# Patient Record
Sex: Female | Born: 1955 | Race: White | Hispanic: No | Smoking: Current every day smoker
Health system: Southern US, Community
[De-identification: ages and names within clinical notes are randomized; demographics above are authoritative.]

## PROBLEM LIST (undated history)

## (undated) DIAGNOSIS — K602 Anal fissure, unspecified: Secondary | ICD-10-CM

## (undated) DIAGNOSIS — G8929 Other chronic pain: Secondary | ICD-10-CM

## (undated) DIAGNOSIS — E7800 Pure hypercholesterolemia, unspecified: Secondary | ICD-10-CM

## (undated) DIAGNOSIS — M48061 Spinal stenosis, lumbar region without neurogenic claudication: Secondary | ICD-10-CM

## (undated) DIAGNOSIS — I700 Atherosclerosis of aorta: Secondary | ICD-10-CM

## (undated) DIAGNOSIS — F329 Major depressive disorder, single episode, unspecified: Secondary | ICD-10-CM

## (undated) DIAGNOSIS — M5136 Other intervertebral disc degeneration, lumbar region: Secondary | ICD-10-CM

## (undated) DIAGNOSIS — Z972 Presence of dental prosthetic device (complete) (partial): Secondary | ICD-10-CM

## (undated) DIAGNOSIS — R519 Headache, unspecified: Secondary | ICD-10-CM

## (undated) DIAGNOSIS — Z7982 Long term (current) use of aspirin: Secondary | ICD-10-CM

## (undated) DIAGNOSIS — T7840XA Allergy, unspecified, initial encounter: Secondary | ICD-10-CM

## (undated) DIAGNOSIS — N8110 Cystocele, unspecified: Secondary | ICD-10-CM

## (undated) DIAGNOSIS — Z860100 Personal history of colon polyps, unspecified: Secondary | ICD-10-CM

## (undated) DIAGNOSIS — R0609 Other forms of dyspnea: Secondary | ICD-10-CM

## (undated) DIAGNOSIS — I10 Essential (primary) hypertension: Secondary | ICD-10-CM

## (undated) DIAGNOSIS — M549 Dorsalgia, unspecified: Principal | ICD-10-CM

## (undated) DIAGNOSIS — K219 Gastro-esophageal reflux disease without esophagitis: Secondary | ICD-10-CM

## (undated) DIAGNOSIS — F32A Depression, unspecified: Secondary | ICD-10-CM

## (undated) DIAGNOSIS — E538 Deficiency of other specified B group vitamins: Secondary | ICD-10-CM

## (undated) DIAGNOSIS — R011 Cardiac murmur, unspecified: Secondary | ICD-10-CM

## (undated) DIAGNOSIS — I499 Cardiac arrhythmia, unspecified: Secondary | ICD-10-CM

## (undated) DIAGNOSIS — Z87440 Personal history of urinary (tract) infections: Secondary | ICD-10-CM

## (undated) DIAGNOSIS — F419 Anxiety disorder, unspecified: Secondary | ICD-10-CM

## (undated) DIAGNOSIS — F410 Panic disorder [episodic paroxysmal anxiety] without agoraphobia: Secondary | ICD-10-CM

## (undated) DIAGNOSIS — C44311 Basal cell carcinoma of skin of nose: Secondary | ICD-10-CM

## (undated) DIAGNOSIS — K8020 Calculus of gallbladder without cholecystitis without obstruction: Secondary | ICD-10-CM

## (undated) DIAGNOSIS — M51369 Other intervertebral disc degeneration, lumbar region without mention of lumbar back pain or lower extremity pain: Secondary | ICD-10-CM

## (undated) DIAGNOSIS — M1990 Unspecified osteoarthritis, unspecified site: Secondary | ICD-10-CM

## (undated) DIAGNOSIS — R0600 Dyspnea, unspecified: Secondary | ICD-10-CM

## (undated) DIAGNOSIS — C801 Malignant (primary) neoplasm, unspecified: Secondary | ICD-10-CM

## (undated) HISTORY — PX: CATARACT EXTRACTION: SUR2

## (undated) HISTORY — DX: Cardiac murmur, unspecified: R01.1

## (undated) HISTORY — PX: BACK SURGERY: SHX140

## (undated) HISTORY — DX: Personal history of colon polyps, unspecified: Z86.0100

## (undated) HISTORY — DX: Unspecified osteoarthritis, unspecified site: M19.90

## (undated) HISTORY — DX: Major depressive disorder, single episode, unspecified: F32.9

## (undated) HISTORY — DX: Panic disorder (episodic paroxysmal anxiety): F41.0

## (undated) HISTORY — DX: Calculus of gallbladder without cholecystitis without obstruction: K80.20

## (undated) HISTORY — PX: MOUTH SURGERY: SHX715

## (undated) HISTORY — DX: Personal history of colonic polyps: Z86.010

## (undated) HISTORY — DX: Allergy, unspecified, initial encounter: T78.40XA

## (undated) HISTORY — DX: Depression, unspecified: F32.A

## (undated) HISTORY — DX: Pure hypercholesterolemia, unspecified: E78.00

## (undated) HISTORY — DX: Other chronic pain: G89.29

## (undated) HISTORY — DX: Dorsalgia, unspecified: M54.9

## (undated) HISTORY — DX: Personal history of urinary (tract) infections: Z87.440

## (undated) HISTORY — DX: Anxiety disorder, unspecified: F41.9

---
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
## 1990-12-06 HISTORY — PX: ABDOMINAL HYSTERECTOMY: SHX81

## 2005-07-30 ENCOUNTER — Ambulatory Visit: Payer: Self-pay | Admitting: Internal Medicine

## 2005-09-15 ENCOUNTER — Emergency Department: Payer: Self-pay | Admitting: Emergency Medicine

## 2006-08-10 ENCOUNTER — Ambulatory Visit: Payer: Self-pay | Admitting: Gastroenterology

## 2006-08-11 ENCOUNTER — Ambulatory Visit: Payer: Self-pay | Admitting: Internal Medicine

## 2007-02-13 ENCOUNTER — Ambulatory Visit: Payer: Self-pay | Admitting: Unknown Physician Specialty

## 2007-12-07 HISTORY — PX: FOOT SURGERY: SHX648

## 2008-03-11 ENCOUNTER — Ambulatory Visit: Payer: Self-pay | Admitting: Internal Medicine

## 2008-04-10 ENCOUNTER — Ambulatory Visit: Payer: Self-pay | Admitting: Internal Medicine

## 2008-12-06 ENCOUNTER — Ambulatory Visit: Payer: Self-pay | Admitting: Gynecologic Oncology

## 2008-12-31 ENCOUNTER — Ambulatory Visit: Payer: Self-pay | Admitting: Gynecologic Oncology

## 2009-01-06 ENCOUNTER — Ambulatory Visit: Payer: Self-pay | Admitting: Gynecologic Oncology

## 2009-01-14 ENCOUNTER — Ambulatory Visit: Payer: Self-pay | Admitting: Gynecologic Oncology

## 2009-01-29 ENCOUNTER — Ambulatory Visit: Payer: Self-pay | Admitting: Gynecologic Oncology

## 2009-02-04 ENCOUNTER — Ambulatory Visit: Payer: Self-pay | Admitting: Gynecologic Oncology

## 2009-04-05 ENCOUNTER — Ambulatory Visit: Payer: Self-pay | Admitting: Gynecologic Oncology

## 2009-04-08 ENCOUNTER — Ambulatory Visit: Payer: Self-pay | Admitting: Gynecologic Oncology

## 2009-04-15 ENCOUNTER — Ambulatory Visit: Payer: Self-pay | Admitting: Gynecologic Oncology

## 2009-05-20 ENCOUNTER — Ambulatory Visit: Payer: Self-pay | Admitting: Gynecologic Oncology

## 2009-05-20 ENCOUNTER — Ambulatory Visit: Payer: Self-pay | Admitting: Internal Medicine

## 2009-07-06 ENCOUNTER — Ambulatory Visit: Payer: Self-pay | Admitting: Gynecologic Oncology

## 2009-08-05 ENCOUNTER — Ambulatory Visit: Payer: Self-pay | Admitting: Gynecologic Oncology

## 2009-09-05 ENCOUNTER — Ambulatory Visit: Payer: Self-pay | Admitting: Gynecologic Oncology

## 2009-12-04 ENCOUNTER — Ambulatory Visit: Payer: Self-pay | Admitting: Podiatry

## 2010-04-05 ENCOUNTER — Ambulatory Visit: Payer: Self-pay | Admitting: Gynecologic Oncology

## 2010-04-07 ENCOUNTER — Ambulatory Visit: Payer: Self-pay | Admitting: Gynecologic Oncology

## 2010-04-30 ENCOUNTER — Ambulatory Visit: Payer: Self-pay | Admitting: Internal Medicine

## 2010-05-06 ENCOUNTER — Ambulatory Visit: Payer: Self-pay | Admitting: Gynecologic Oncology

## 2011-07-02 ENCOUNTER — Ambulatory Visit: Payer: Self-pay | Admitting: General Surgery

## 2011-07-02 HISTORY — PX: COLONOSCOPY: SHX174

## 2011-07-05 LAB — PATHOLOGY REPORT

## 2011-09-08 ENCOUNTER — Ambulatory Visit: Payer: Self-pay

## 2012-01-02 ENCOUNTER — Emergency Department: Payer: Self-pay | Admitting: Unknown Physician Specialty

## 2012-01-02 LAB — CBC
HCT: 38.2 % (ref 35.0–47.0)
MCHC: 34.6 g/dL (ref 32.0–36.0)
Platelet: 270 10*3/uL (ref 150–440)
RDW: 12.4 % (ref 11.5–14.5)

## 2012-01-02 LAB — URINALYSIS, COMPLETE
Bacteria: NONE SEEN
Glucose,UR: NEGATIVE mg/dL (ref 0–75)
Ketone: NEGATIVE
Nitrite: NEGATIVE
RBC,UR: 1 /HPF (ref 0–5)
Specific Gravity: 1.008 (ref 1.003–1.030)
Squamous Epithelial: 1
WBC UR: <1 /HPF (ref 0–5)

## 2012-01-02 LAB — COMPREHENSIVE METABOLIC PANEL
Albumin: 3.4 g/dL (ref 3.4–5.0)
Alkaline Phosphatase: 95 U/L (ref 50–136)
BUN: 12 mg/dL (ref 7–18)
Calcium, Total: 8.6 mg/dL (ref 8.5–10.1)
Co2: 24 mmol/L (ref 21–32)
EGFR (African American): >60
EGFR (Non-African Amer.): >60
Glucose: 104 mg/dL — ABNORMAL HIGH (ref 65–99)
SGOT(AST): 27 U/L (ref 15–37)
SGPT (ALT): 25 U/L
Sodium: 147 mmol/L — ABNORMAL HIGH (ref 136–145)

## 2012-01-02 LAB — TROPONIN I: Troponin-I: <0.02 ng/mL

## 2012-01-02 LAB — LIPASE, BLOOD: Lipase: 124 U/L (ref 73–393)

## 2012-11-08 ENCOUNTER — Other Ambulatory Visit: Payer: Self-pay | Admitting: Anesthesiology

## 2012-11-08 DIAGNOSIS — M545 Low back pain: Secondary | ICD-10-CM

## 2012-11-09 ENCOUNTER — Ambulatory Visit
Admission: RE | Admit: 2012-11-09 | Discharge: 2012-11-09 | Disposition: A | Discharge status: Home / Self Care | Payer: 59 | Source: Ambulatory Visit | Attending: Anesthesiology | Admitting: Anesthesiology

## 2012-11-09 DIAGNOSIS — M545 Low back pain: Secondary | ICD-10-CM

## 2013-06-07 ENCOUNTER — Ambulatory Visit: Payer: Self-pay | Admitting: Internal Medicine

## 2013-06-07 LAB — HM MAMMOGRAPHY

## 2013-07-16 ENCOUNTER — Encounter: Payer: Self-pay | Admitting: Internal Medicine

## 2013-07-16 ENCOUNTER — Ambulatory Visit (INDEPENDENT_AMBULATORY_CARE_PROVIDER_SITE_OTHER): Payer: 59 | Admitting: Internal Medicine

## 2013-07-16 VITALS — BP 130/90 | HR 79 | Temp 98.8°F | Ht 65.0 in | Wt 199.8 lb

## 2013-07-16 DIAGNOSIS — F411 Generalized anxiety disorder: Secondary | ICD-10-CM

## 2013-07-16 DIAGNOSIS — F410 Panic disorder [episodic paroxysmal anxiety] without agoraphobia: Secondary | ICD-10-CM

## 2013-07-16 DIAGNOSIS — F329 Major depressive disorder, single episode, unspecified: Secondary | ICD-10-CM

## 2013-07-16 DIAGNOSIS — Z9109 Other allergy status, other than to drugs and biological substances: Secondary | ICD-10-CM

## 2013-07-16 DIAGNOSIS — G8929 Other chronic pain: Secondary | ICD-10-CM

## 2013-07-16 DIAGNOSIS — M549 Dorsalgia, unspecified: Secondary | ICD-10-CM

## 2013-07-16 DIAGNOSIS — F419 Anxiety disorder, unspecified: Secondary | ICD-10-CM

## 2013-07-16 DIAGNOSIS — Z86010 Personal history of colonic polyps: Secondary | ICD-10-CM

## 2013-07-16 DIAGNOSIS — E7800 Pure hypercholesterolemia, unspecified: Secondary | ICD-10-CM

## 2013-07-16 MED ORDER — FLUOXETINE HCL 10 MG PO CAPS: 10.0000 mg | ORAL_CAPSULE | Freq: Every day | ORAL | Status: DC

## 2013-07-18 ENCOUNTER — Telehealth: Payer: Self-pay | Admitting: Internal Medicine

## 2013-07-18 ENCOUNTER — Encounter: Payer: Self-pay | Admitting: Internal Medicine

## 2013-07-18 DIAGNOSIS — E785 Hyperlipidemia, unspecified: Secondary | ICD-10-CM | POA: Insufficient documentation

## 2013-07-18 DIAGNOSIS — Z9109 Other allergy status, other than to drugs and biological substances: Secondary | ICD-10-CM | POA: Insufficient documentation

## 2013-07-18 DIAGNOSIS — F320 Major depressive disorder, single episode, mild: Secondary | ICD-10-CM | POA: Insufficient documentation

## 2013-07-18 DIAGNOSIS — Z860100 Personal history of colon polyps, unspecified: Secondary | ICD-10-CM | POA: Insufficient documentation

## 2013-07-18 DIAGNOSIS — G8929 Other chronic pain: Secondary | ICD-10-CM | POA: Insufficient documentation

## 2013-07-18 DIAGNOSIS — F329 Major depressive disorder, single episode, unspecified: Secondary | ICD-10-CM | POA: Insufficient documentation

## 2013-07-18 DIAGNOSIS — M549 Dorsalgia, unspecified: Secondary | ICD-10-CM | POA: Insufficient documentation

## 2013-07-18 DIAGNOSIS — F419 Anxiety disorder, unspecified: Secondary | ICD-10-CM | POA: Insufficient documentation

## 2013-07-18 DIAGNOSIS — E7800 Pure hypercholesterolemia, unspecified: Secondary | ICD-10-CM | POA: Insufficient documentation

## 2013-07-18 DIAGNOSIS — F331 Major depressive disorder, recurrent, moderate: Secondary | ICD-10-CM | POA: Insufficient documentation

## 2013-07-18 DIAGNOSIS — F410 Panic disorder [episodic paroxysmal anxiety] without agoraphobia: Secondary | ICD-10-CM | POA: Insufficient documentation

## 2013-07-18 NOTE — Assessment & Plan Note (Signed)
Has a history of panic attacks.  Start prozac as outlined.  Has xanax.  Would like to decrease amount using.  Follow closely.

## 2013-07-18 NOTE — Assessment & Plan Note (Signed)
Persistent problem.  Increased stress as outlined.  Has previously been on zoloft and cymbalta as outlined.  Will start prozac 10mg  q day.  Discussed with her at length today.  Discussed my goal is to get her using the xanax only as needed.  Get her back in soon to reassess.  Any worsening change or problems prior to follow up - she needs eval.

## 2013-07-18 NOTE — Assessment & Plan Note (Signed)
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  Start prozac as outlined.  Get her back in soon to reassess.

## 2013-07-18 NOTE — Assessment & Plan Note (Signed)
Has seen Dr Jeral Fruit and Dr Lovell Sheehan.  Has known degenerative disc disease.  Has had injections and PT.  Continue follow up with her surgeon.

## 2013-07-18 NOTE — Telephone Encounter (Signed)
You had left me a message regarding this pt.  She is a new pt - established care 07/17/13.  She stated she forgot to mention to me that she was having some ankle and finger swelling.  Was questioning if she should be on something for this.  I want to hold off adding any more new medication.  Will get her lab results and records and then go from there.  Let me know if any problems.

## 2013-07-18 NOTE — Assessment & Plan Note (Signed)
Stable.  Follow.   

## 2013-07-18 NOTE — Progress Notes (Signed)
Subjective:    Patient ID: Angel Riddle, female    DOB: [birth year], 57 y.o.   MRN: [id number]  HPI 57 year old female with past history of depression, anxiety, panic attacks, chronic back pain and hypercholesterolemia.  She comes in today to follow up on these issues as well as to establish care.  Former pt of Dr Randa Lynn.  Has chronic back pain and has seen Dr Jeral Fruit and Dr Lovell Sheehan.  Has had injections and physical therapy.  Does limit her activity.  She reports increased stress and anxiety.  Was previously on zoloft.  Per her report, dose increased and she did not tolerate.  She was tried on cymbalta.  This helped, but could not afford.  She is now using xanax.  Takes 1-3 per day.  Increased stress at home.  Her husband has retired.  Is home now.  Used to travel a lot.  This has been a big adjustment for her.  Also has had increased stress with dealing with multiple deaths recently.  Mother passes away in 01/26/2023.  Her children's father recently passed away, as well as her best friend.  Feels some reactive depression related to this.  Breathing stable.  Does have some arm discomfort.  Limited rom.  Present now for a while.  Eating and drinking well.  No nausea or vomiting.    Past Medical History  Diagnosis Date  . Arthritis   . Depression   . Allergy   . Heart murmur   . Hypercholesterolemia   . Hx: UTI (urinary tract infection)   . Panic attacks     H/O  . Hx of colonic polyp   . Anxiety   . Chronic back pain     degenerative disc disease    Outpatient Encounter Prescriptions as of 07/16/2013  Medication Sig Dispense Refill  . ALPRAZolam (XANAX) 1 MG tablet Take 1 mg by mouth 3 (three) times daily as needed for sleep.       Marland Kitchen aspirin 81 MG tablet Take 81 mg by mouth as needed for pain.      . promethazine (PHENERGAN) 25 MG tablet Take 25 mg by mouth every 6 (six) hours as needed for nausea.      Marland Kitchen FLUoxetine (PROZAC) 10 MG capsule Take 1 capsule (10 mg total) by mouth daily.  30 capsule  1    No facility-administered encounter medications on file as of 07/16/2013.    Review of Systems Patient denies any headache, lightheadedness or dizziness. No significant sinus or allergy symptoms currently.   No chest pain, tightness or palpitations.  No increased shortness of breath, cough or congestion.  Breathing stable.  No nausea or vomiting.  No abdominal pain or cramping.  No bowel change, such as diarrhea, constipation, BRBPR or melana.  No urine change.  Persistent back pain as outlined.  Also pain and limited rom - upper extremity.  Increased stress and anxiety as outlined.  Taking xanax.       Objective:   Physical Exam Filed Vitals:   07/16/13 1426  BP: 130/90  Pulse: 79  Temp: 98.8 F (24.70 C)   57 year old female in no acute distress.   HEENT:  Nares- clear.  Oropharynx - without lesions. NECK:  Supple.  Nontender.  No audible bruit.  HEART:  Appears to be regular. LUNGS:  No crackles or wheezing audible.  Respirations even and unlabored.  RADIAL PULSE:  Equal bilaterally.  ABDOMEN:  Soft, nontender.  Bowel sounds present and  normal.  No audible abdominal bruit.   EXTREMITIES:  No increased edema present.  DP pulses palpable and equal bilaterally.       SKIN:  No rash.       Assessment & Plan:  HEALTH MAINTENANCE.  Will schedule a physical when due.  Obtain outside records for review.    I spent over 45 minutes with the patient and more than 50% of the time was spent in consultation regarding the above.

## 2013-07-18 NOTE — Assessment & Plan Note (Signed)
On no medication.  Low cholesterol diet.  Follow.   

## 2013-07-18 NOTE — Assessment & Plan Note (Signed)
Has had colonoscopy.  Obtain records for review.

## 2013-07-19 ENCOUNTER — Encounter: Payer: Self-pay | Admitting: *Deleted

## 2013-07-19 NOTE — Telephone Encounter (Signed)
Mailed letter °

## 2013-07-20 ENCOUNTER — Telehealth: Payer: Self-pay | Admitting: Internal Medicine

## 2013-07-20 NOTE — Telephone Encounter (Signed)
Pt.notified

## 2013-07-20 NOTE — Telephone Encounter (Signed)
Was prescribed Prozac at last visit; asking how long she will take this before seeing effects of it.  States she has been having nausea, but it does not last all day.  Not sure if it is coming from the medication or if she has a bug.  Asking for a call.

## 2013-07-20 NOTE — Telephone Encounter (Signed)
We started her on a low dose.  I am not sure if the nausea is coming from the prozac.  It may take several weeks, but should hopefully see gradual improvement.

## 2013-08-17 ENCOUNTER — Ambulatory Visit: Payer: 59 | Admitting: Internal Medicine

## 2013-08-27 ENCOUNTER — Ambulatory Visit: Payer: 59 | Admitting: Internal Medicine

## 2013-08-27 HISTORY — PX: OTHER SURGICAL HISTORY: SHX169

## 2013-09-05 ENCOUNTER — Encounter: Payer: Self-pay | Admitting: Internal Medicine

## 2013-09-05 ENCOUNTER — Ambulatory Visit (INDEPENDENT_AMBULATORY_CARE_PROVIDER_SITE_OTHER): Payer: 59 | Admitting: Internal Medicine

## 2013-09-05 VITALS — BP 118/90 | HR 77 | Temp 98.8°F | Ht 65.0 in | Wt 191.5 lb

## 2013-09-05 DIAGNOSIS — M549 Dorsalgia, unspecified: Secondary | ICD-10-CM

## 2013-09-05 DIAGNOSIS — E7800 Pure hypercholesterolemia, unspecified: Secondary | ICD-10-CM

## 2013-09-05 DIAGNOSIS — F410 Panic disorder [episodic paroxysmal anxiety] without agoraphobia: Secondary | ICD-10-CM

## 2013-09-05 DIAGNOSIS — F411 Generalized anxiety disorder: Secondary | ICD-10-CM

## 2013-09-05 DIAGNOSIS — F329 Major depressive disorder, single episode, unspecified: Secondary | ICD-10-CM

## 2013-09-05 DIAGNOSIS — Z23 Encounter for immunization: Secondary | ICD-10-CM

## 2013-09-05 DIAGNOSIS — F419 Anxiety disorder, unspecified: Secondary | ICD-10-CM

## 2013-09-05 DIAGNOSIS — G8929 Other chronic pain: Secondary | ICD-10-CM

## 2013-09-05 MED ORDER — FLUOXETINE HCL 20 MG PO TABS: 20.0000 mg | ORAL_TABLET | Freq: Every day | ORAL | Status: DC

## 2013-09-05 MED ORDER — PROMETHAZINE HCL 25 MG PO TABS: 25.0000 mg | ORAL_TABLET | Freq: Two times a day (BID) | ORAL | Status: DC | PRN

## 2013-09-07 ENCOUNTER — Encounter: Payer: Self-pay | Admitting: Internal Medicine

## 2013-09-07 NOTE — Assessment & Plan Note (Signed)
On no medication.  Low cholesterol diet.  Follow.   

## 2013-09-07 NOTE — Assessment & Plan Note (Signed)
Has a history of panic attacks.  Increased anxiety and stress.  Started prozac last visit.  Doing better.  Feels better.   Has decreased the amount of xanax she is taking.  Feels she needs to increase the dose of prozac.  Will increase to 20mg  q day.  Follow.  Get her back in soon to reassess.

## 2013-09-07 NOTE — Assessment & Plan Note (Signed)
Has seen Dr Jeral Fruit and Dr Lovell Sheehan.  Has known degenerative disc disease and reported spinal stenosis.   Has had injections and PT.  Request referral to Dr Donette Larry - [facility].

## 2013-09-07 NOTE — Assessment & Plan Note (Signed)
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  Increased stress and anxiety.  See last note for details.  On Prozac and doing better. Will increase prozac to 20mg  q day.  Follow.  Get her back in soon to reassess.

## 2013-09-07 NOTE — Progress Notes (Signed)
Subjective:    Patient ID: Angel Riddle, female    DOB: Sep 24, 1956, 57 y.o.   MRN: [id number]  HPI 57 year old female with past history of depression, anxiety, panic attacks, chronic back pain and hypercholesterolemia.  She comes in today for a scheduled follow up.   Has chronic back pain and has seen Dr Jeral Fruit and Dr Lovell Sheehan.  Has had injections and physical therapy.  Does limit her activity.  Request referral to see Dr Donette Larry for further evaluation.   Last visit reported increased stress and anxiety.  Was previously on zoloft.  Per her report, dose increased and she did not tolerate.  She was tried on cymbalta.  This helped, but could not afford.  See last note for details.  Was started on Prozac.  States she feels better.  Has cut down on her xanax.  Coping better.  Still feels she needs a little something more.   Eating and drinking well.  No nausea or vomiting.  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  Healing well.     Past Medical History  Diagnosis Date  . Arthritis   . Depression   . Allergy   . Heart murmur   . Hypercholesterolemia   . Hx: UTI (urinary tract infection)   . Panic attacks     H/O  . Hx of colonic polyp   . Anxiety   . Chronic back pain     degenerative disc disease    Outpatient Encounter Prescriptions as of 09/05/2013  Medication Sig Dispense Refill  . ALPRAZolam (XANAX) 1 MG tablet Take 1 mg by mouth 3 (three) times daily as needed for sleep.       Marland Kitchen aspirin 81 MG tablet Take 81 mg by mouth as needed for pain.      . promethazine (PHENERGAN) 25 MG tablet Take 1 tablet (25 mg total) by mouth 2 (two) times daily as needed for nausea.  30 tablet  0  . [DISCONTINUED] FLUoxetine (PROZAC) 10 MG capsule Take 1 capsule (10 mg total) by mouth daily.  30 capsule  1  . [DISCONTINUED] promethazine (PHENERGAN) 25 MG tablet Take 25 mg by mouth every 6 (six) hours as needed for nausea.      Marland Kitchen FLUoxetine (PROZAC) 20 MG tablet Take 1 tablet (20 mg total) by mouth  daily.  30 tablet  3   No facility-administered encounter medications on file as of 09/05/2013.    Review of Systems Patient denies any headache, lightheadedness or dizziness. No significant sinus or allergy symptoms currently.   No chest pain, tightness or palpitations.  No increased shortness of breath, cough or congestion.  Breathing stable.  No nausea or vomiting.  No abdominal pain or cramping.  No bowel change, such as diarrhea, constipation, BRBPR or melana.  No urine change.  Persistent back pain as outlined.  Also pain and limited rom - upper extremity.  Increased stress and anxiety as outlined.  Better.  Has cut down on the amount of xanax taking.        Objective:   Physical Exam  Filed Vitals:   09/05/13 1102  BP: 118/90  Pulse: 77  Temp: 98.8 F (53.77 C)   57 year old female in no acute distress.   HEENT:  Nares- clear.  Oropharynx - without lesions. NECK:  Supple.  Nontender.  No audible bruit.  HEART:  Appears to be regular. LUNGS:  No crackles or wheezing audible.  Respirations even and unlabored.  RADIAL PULSE:  Equal bilaterally.  ABDOMEN:  Soft, nontender.  Bowel sounds present and normal.  No audible abdominal bruit.   EXTREMITIES:  No increased edema present.  DP pulses palpable and equal bilaterally.        Assessment & Plan:  HEALTH MAINTENANCE.  Will schedule a physical when due.  Obtain outside records for review.

## 2013-09-07 NOTE — Assessment & Plan Note (Signed)
Increased stress as outlined in last note.  On prozac 10mg  q day.  Increase to 20mg  q day.  Doing better.  Follow.

## 2013-09-12 ENCOUNTER — Ambulatory Visit: Payer: 59 | Admitting: Internal Medicine

## 2013-10-02 ENCOUNTER — Ambulatory Visit (INDEPENDENT_AMBULATORY_CARE_PROVIDER_SITE_OTHER): Payer: 59 | Admitting: Adult Health

## 2013-10-02 ENCOUNTER — Telehealth: Payer: Self-pay | Admitting: Internal Medicine

## 2013-10-02 ENCOUNTER — Encounter: Payer: Self-pay | Admitting: Adult Health

## 2013-10-02 VITALS — BP 122/76 | HR 100 | Temp 98.7°F | Resp 12 | Wt 188.0 lb

## 2013-10-02 DIAGNOSIS — R209 Unspecified disturbances of skin sensation: Secondary | ICD-10-CM

## 2013-10-02 DIAGNOSIS — I10 Essential (primary) hypertension: Secondary | ICD-10-CM | POA: Insufficient documentation

## 2013-10-02 DIAGNOSIS — R200 Anesthesia of skin: Secondary | ICD-10-CM | POA: Insufficient documentation

## 2013-10-02 DIAGNOSIS — R079 Chest pain, unspecified: Secondary | ICD-10-CM

## 2013-10-02 MED ORDER — HYDROCHLOROTHIAZIDE 25 MG PO TABS: 12.5000 mg | ORAL_TABLET | Freq: Every day | ORAL | Status: DC

## 2013-10-02 NOTE — Assessment & Plan Note (Addendum)
Patient with blood pressure readings systolic in the 120s and diastolic between 80 and 90. Uncertain if patient's symptoms are attributed to her elevated blood pressure or to anxiety. Start HCTZ 12.5 mg daily. Return for followup on November 13th

## 2013-10-02 NOTE — Assessment & Plan Note (Addendum)
Transient numbness and tingling of bilateral upper extremities. Uncertain etiology. EKG without evidence of ischemia. Check complete metabolic panel, B12. ?Patient history of anxiety and panic attacks and whether symptoms may be related. Appointment for followup on November 13th

## 2013-10-02 NOTE — Patient Instructions (Signed)
  I am starting you on hydrochlorothiazide 12.5 mg daily. The tablets are 25 mg so you will need to cut in half.  Please have your labs drawn prior to leaving.  We will call you with results once they are available.  Return for follow up on November 13th at 2:00 pm with Dr. Lorin Picket

## 2013-10-02 NOTE — Telephone Encounter (Signed)
Patient Information:  Caller Name: Quintana  Phone: [phone]  Patient: Angel Riddle, Angel Riddle  Gender: Female  DOB: 1956/11/03  Age: 57 Years  PCP: Dale Gilbert  Office Follow Up:  Does the office need to follow up with this patient?: No  Instructions For The Office: N/A  RN Note:  Denies chest pain. Approximately 09/18/13 (two weeks ago) had episode of chest discomfort ("feeling heavy") for 1 hour. Occasional dizziness and vision "can be fuzzy", at times. History of herrniated disks. Scheduled to see neurologist 11/09/13. Able to use both hands and arms.    Symptoms  Reason For Call & Symptoms: Emergent Call:  Reports hypertension with nausea, poor sleeping, numbness and tingling in arms and hands, like hands are asleep, waking her at night.  BP 126/89. BP 127/96 at 1245.  Arms feel heavy now, worse on left side.  Reviewed Health History In EMR: Yes  Reviewed Medications In EMR: Yes  Reviewed Allergies In EMR: Yes  Reviewed Surgeries / Procedures: Yes  Date of Onset of Symptoms: 09/18/2013  Treatments Tried: Xanax, Phenergan  Treatments Tried Worked: Yes  Guideline(s) Used:  Neurologic Deficit  Disposition Per Guideline:   Home Care  Reason For Disposition Reached:   Transient tingling in hand (e.g., pins and needles) after prolonged laying on arm  Advice Given:  N/A  RN Overrode Recommendation:  Make Appointment  RN selected  incorrect outcome in error while discussing symptoms.  Patient advised to see MD 10/02/13 within 4 hours for gradual onset of recurrent tingling and numbness in both arms but worse on left side.  Appointment Scheduled:  10/02/2013 16:15:00 Appointment Scheduled Provider:  Orville Govern

## 2013-10-02 NOTE — Progress Notes (Signed)
  Subjective:    Patient ID: Angel Riddle, female    DOB: 1956-09-18, 57 y.o.   MRN: [id number]  HPI  Patient presents to clinic stating that she feels "weird". She reports a hx of panic attacks. She is on Prozac 20 mg daily. She is experiencing transient tingling and numbness in bilateral upper extremities. Ongoing nausea for several months for which she takes phenergan. She denies taking any new medications. She reports that her b/p at home has been 120s systolic and 80-90 diastolic. Today's b/p 126/89 and BP 127/96. Denies chest pain, shortness of breath.    Current Outpatient Prescriptions on File Prior to Visit  Medication Sig Dispense Refill  . ALPRAZolam (XANAX) 1 MG tablet Take 1 mg by mouth 3 (three) times daily as needed for sleep.       Marland Kitchen aspirin 81 MG tablet Take 81 mg by mouth as needed for pain.      Marland Kitchen FLUoxetine (PROZAC) 20 MG tablet Take 1 tablet (20 mg total) by mouth daily.  30 tablet  3  . promethazine (PHENERGAN) 25 MG tablet Take 1 tablet (25 mg total) by mouth 2 (two) times daily as needed for nausea.  30 tablet  0   No current facility-administered medications on file prior to visit.    Review of Systems  Constitutional: Negative.   HENT: Negative.   Respiratory: Negative for shortness of breath and wheezing.        Occasional chest tightness  Cardiovascular: Negative.   Neurological: Positive for numbness. Negative for dizziness, tremors, facial asymmetry, weakness and headaches.       Numbness and tingling of bilateral upper extremities.  Psychiatric/Behavioral: Negative for confusion and agitation. The patient is nervous/anxious.   All other systems reviewed and are negative.       Objective:   Physical Exam  Constitutional: She is oriented to person, place, and time.  Pleasant 57 year old female in no apparent distress.  Cardiovascular: Normal rate and regular rhythm.   Pulmonary/Chest: Effort normal. No respiratory distress.  Musculoskeletal: She  exhibits edema.  Trace edema bilateral ankles  Neurological: She is alert and oriented to person, place, and time. Coordination normal.  Speech is clear and coherent. No facial droop or asymmetry.  Psychiatric:  Appears anxious.    BP 122/76  Pulse 100  Temp(Src) 98.7 F (37.1 C) (Oral)  Resp 12  Wt 188 lb (85.276 kg)  BMI 31.28 kg/m2  SpO2 97%       Assessment & Plan:

## 2013-10-03 LAB — COMPREHENSIVE METABOLIC PANEL
ALT: 10 U/L (ref 0–35)
Albumin: 4.3 g/dL (ref 3.5–5.2)
BUN: 13 mg/dL (ref 6–23)
CO2: 21 mEq/L (ref 19–32)
Calcium: 9.7 mg/dL (ref 8.4–10.5)
Chloride: 106 mEq/L (ref 96–112)
Creatinine, Ser: 0.8 mg/dL (ref 0.4–1.2)
GFR: 83.18 mL/min (ref >60.00)
Total Bilirubin: 0.7 mg/dL (ref 0.3–1.2)

## 2013-10-03 LAB — VITAMIN B12: Vitamin B-12: 221 pg/mL (ref 211–911)

## 2013-10-04 ENCOUNTER — Other Ambulatory Visit: Payer: Self-pay | Admitting: *Deleted

## 2013-10-04 ENCOUNTER — Telehealth: Payer: Self-pay | Admitting: Internal Medicine

## 2013-10-04 ENCOUNTER — Ambulatory Visit (INDEPENDENT_AMBULATORY_CARE_PROVIDER_SITE_OTHER): Payer: 59 | Admitting: *Deleted

## 2013-10-04 DIAGNOSIS — E538 Deficiency of other specified B group vitamins: Secondary | ICD-10-CM

## 2013-10-04 MED ORDER — CYANOCOBALAMIN 1000 MCG/ML IJ SOLN
1000.0000 ug | Freq: Once | INTRAMUSCULAR | Status: AC
  Administered 2013-10-04: 1000 ug via INTRAMUSCULAR

## 2013-10-04 NOTE — Telephone Encounter (Signed)
[address] in [place] Pt stated she needed new rx for alprazlam sent to pharmacy Pt has 6 pills left

## 2013-10-04 NOTE — Telephone Encounter (Signed)
Okay to refill? 

## 2013-10-05 MED ORDER — ALPRAZOLAM 1 MG PO TABS: 1.0000 mg | ORAL_TABLET | Freq: Every evening | ORAL | Status: DC | PRN

## 2013-10-05 NOTE — Telephone Encounter (Signed)
rx for alprazolam #30 with no refills.

## 2013-10-08 ENCOUNTER — Encounter: Payer: Self-pay | Admitting: *Deleted

## 2013-10-08 ENCOUNTER — Ambulatory Visit (INDEPENDENT_AMBULATORY_CARE_PROVIDER_SITE_OTHER): Payer: 59 | Admitting: *Deleted

## 2013-10-08 DIAGNOSIS — E538 Deficiency of other specified B group vitamins: Secondary | ICD-10-CM

## 2013-10-08 MED ORDER — CYANOCOBALAMIN 1000 MCG/ML IJ SOLN
1000.0000 ug | Freq: Once | INTRAMUSCULAR | Status: AC
  Administered 2013-10-08: 1000 ug via INTRAMUSCULAR

## 2013-10-08 NOTE — Telephone Encounter (Signed)
Patient came in for B-12 injections, she state her BP has been running high like 140/80s and when I took it in the office today 120/98. Would like to know what she should do, only taking 1/2 of HCTZ now.

## 2013-10-08 NOTE — Telephone Encounter (Signed)
Called pt.  She did not understand the urine test today.  Explained to her why this was checked.  Also discussed her blood pressure.  States it is ranging 120-140s/70-90.  Just started HCTZ.  Has an appt next week with me.  Feels better now.  Will spot check her pressure and record.  Will notify me if problems.  Will keep appt next week.

## 2013-10-10 ENCOUNTER — Ambulatory Visit (INDEPENDENT_AMBULATORY_CARE_PROVIDER_SITE_OTHER): Payer: 59 | Admitting: *Deleted

## 2013-10-10 DIAGNOSIS — E538 Deficiency of other specified B group vitamins: Secondary | ICD-10-CM

## 2013-10-10 MED ORDER — CYANOCOBALAMIN 1000 MCG/ML IJ SOLN
1000.0000 ug | Freq: Once | INTRAMUSCULAR | Status: AC
  Administered 2013-10-10: 1000 ug via INTRAMUSCULAR

## 2013-10-12 ENCOUNTER — Ambulatory Visit: Payer: 59

## 2013-10-18 ENCOUNTER — Encounter: Payer: Self-pay | Admitting: Internal Medicine

## 2013-10-18 ENCOUNTER — Ambulatory Visit (INDEPENDENT_AMBULATORY_CARE_PROVIDER_SITE_OTHER): Payer: 59 | Admitting: Internal Medicine

## 2013-10-18 VITALS — BP 120/80 | HR 73 | Temp 98.6°F | Wt 187.2 lb

## 2013-10-18 DIAGNOSIS — F329 Major depressive disorder, single episode, unspecified: Secondary | ICD-10-CM

## 2013-10-18 DIAGNOSIS — F32A Depression, unspecified: Secondary | ICD-10-CM

## 2013-10-18 DIAGNOSIS — M549 Dorsalgia, unspecified: Secondary | ICD-10-CM

## 2013-10-18 DIAGNOSIS — Z9109 Other allergy status, other than to drugs and biological substances: Secondary | ICD-10-CM

## 2013-10-18 DIAGNOSIS — Z860100 Personal history of colon polyps, unspecified: Secondary | ICD-10-CM

## 2013-10-18 DIAGNOSIS — F411 Generalized anxiety disorder: Secondary | ICD-10-CM

## 2013-10-18 DIAGNOSIS — F410 Panic disorder [episodic paroxysmal anxiety] without agoraphobia: Secondary | ICD-10-CM

## 2013-10-18 DIAGNOSIS — I10 Essential (primary) hypertension: Secondary | ICD-10-CM

## 2013-10-18 DIAGNOSIS — E7800 Pure hypercholesterolemia, unspecified: Secondary | ICD-10-CM

## 2013-10-18 DIAGNOSIS — R209 Unspecified disturbances of skin sensation: Secondary | ICD-10-CM

## 2013-10-18 DIAGNOSIS — G8929 Other chronic pain: Secondary | ICD-10-CM

## 2013-10-18 DIAGNOSIS — R200 Anesthesia of skin: Secondary | ICD-10-CM

## 2013-10-18 DIAGNOSIS — F419 Anxiety disorder, unspecified: Secondary | ICD-10-CM

## 2013-10-18 LAB — BASIC METABOLIC PANEL
BUN: 16 mg/dL (ref 6–23)
Chloride: 101 mEq/L (ref 96–112)
GFR: 96.18 mL/min (ref >60.00)
Potassium: 4 mEq/L (ref 3.5–5.1)
Sodium: 136 mEq/L (ref 135–145)

## 2013-10-18 MED ORDER — ALPRAZOLAM 1 MG PO TABS: 1.0000 mg | ORAL_TABLET | Freq: Two times a day (BID) | ORAL | Status: DC | PRN

## 2013-10-18 NOTE — Progress Notes (Signed)
Subjective:    Patient ID: Angel Riddle, female    DOB: February 18, 1956, 57 y.o.   MRN: [id number]  HPI 57 year old female with past history of depression, anxiety, panic attacks, chronic back pain and hypercholesterolemia.  She comes in today for a scheduled follow up.   Has chronic back pain and has seen Dr Jeral Fruit and Dr Lovell Sheehan.  Has had injections and physical therapy.  Does limit her activity.  Has appointment to see Dr Donette Larry for further evaluation.  Has had increased stress and anxiety.  Was previously on zoloft.  Per her report, dose increased and she did not tolerate. She was tried on cymbalta.  This helped, but could not afford.  See previous notes for details.  Was started on Prozac.  States she feels better.  Coping better.  Still requires xanax.   Eating and drinking well.  No nausea or vomiting.  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  Was recently evaluated by Raquel.  Had some tingling in her hands and arms.  She states felt like a panic attack.  B12 was low normal.  Raquel started her on B12 injections.  She would like to stop them.    Past Medical History  Diagnosis Date  . Arthritis   . Depression   . Allergy   . Heart murmur   . Hypercholesterolemia   . Hx: UTI (urinary tract infection)   . Panic attacks     H/O  . Hx of colonic polyp   . Anxiety   . Chronic back pain     degenerative disc disease    Outpatient Encounter Prescriptions as of 10/18/2013  Medication Sig  . ALPRAZolam (XANAX) 1 MG tablet Take 1 tablet (1 mg total) by mouth at bedtime as needed for sleep.  Marland Kitchen aspirin 81 MG tablet Take 81 mg by mouth daily.   Marland Kitchen FLUoxetine (PROZAC) 20 MG tablet Take 1 tablet (20 mg total) by mouth daily.  . hydrochlorothiazide (HYDRODIURIL) 25 MG tablet Take 0.5 tablets (12.5 mg total) by mouth daily.  . promethazine (PHENERGAN) 25 MG tablet Take 1 tablet (25 mg total) by mouth 2 (two) times daily as needed for nausea.    Review of Systems Patient denies any  headache, lightheadedness or dizziness.  No significant sinus or allergy symptoms currently.   No chest pain, tightness or palpitations.  No increased shortness of breath, cough or congestion.  Breathing stable.  No nausea or vomiting.  No abdominal pain or cramping.  No bowel change, such as diarrhea, constipation, BRBPR or melana.  No urine change.  Persistent back pain as outlined.  Also pain and limited rom - upper extremity.  Increased stress and anxiety as outlined.  Better.  Still requiring xanax.  No numbness or tingling or her upper extremities.         Objective:   Physical Exam  Filed Vitals:   10/18/13 1354  BP: 120/80  Pulse: 73  Temp: 98.6 F (88 C)   57 year old female in no acute distress.   HEENT:  Nares- clear.  Oropharynx - without lesions. NECK:  Supple.  Nontender.  No audible bruit.  HEART:  Appears to be regular. LUNGS:  No crackles or wheezing audible.  Respirations even and unlabored.  RADIAL PULSE:  Equal bilaterally.  ABDOMEN:  Soft, nontender.  Bowel sounds present and normal.  No audible abdominal bruit.   EXTREMITIES:  No increased edema present.  DP pulses palpable and equal bilaterally.  Assessment & Plan:  HEALTH MAINTENANCE.  Will schedule a physical when due.  Obtain outside records for review.

## 2013-10-18 NOTE — Progress Notes (Signed)
Pre-visit discussion using our clinic review tool. No additional management support is needed unless otherwise documented below in the visit note.  

## 2013-10-18 NOTE — Assessment & Plan Note (Addendum)
Increased stress as outlined in last note.  On prozac.   Doing better.  Xanax as needed. Follow.

## 2013-10-19 ENCOUNTER — Encounter: Payer: Self-pay | Admitting: *Deleted

## 2013-10-21 ENCOUNTER — Encounter: Payer: Self-pay | Admitting: Internal Medicine

## 2013-10-21 NOTE — Assessment & Plan Note (Signed)
Resolved.  Will stop B12 injections.

## 2013-10-21 NOTE — Assessment & Plan Note (Signed)
Has had colonoscopy.  Obtain records for review.

## 2013-10-21 NOTE — Assessment & Plan Note (Signed)
Blood pressure had been elevated recently.  Angel Riddle started her on HCTZ 12.5mg  q day.  Blood pressure ok now.  Check metabolic panel.  Follow.  If can get stress, anxiety and back pain under control - may not need.

## 2013-10-21 NOTE — Assessment & Plan Note (Signed)
Stable.  Follow.   

## 2013-10-21 NOTE — Assessment & Plan Note (Signed)
Has a history of panic attacks.  Increased anxiety and stress.  On prozac now.   Doing better.  Feels better.   Continue xanax as needed.       

## 2013-10-21 NOTE — Assessment & Plan Note (Signed)
Has seen Dr Jeral Fruit and Dr Lovell Sheehan.  Has known degenerative disc disease and reported spinal stenosis.   Has had injections and PT.  Has an upcoming appt with Dr Donette Larry - [facility].

## 2013-10-21 NOTE — Assessment & Plan Note (Signed)
On no medication.  Low cholesterol diet.  Follow.   

## 2013-10-21 NOTE — Assessment & Plan Note (Signed)
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  Increased stress and anxiety.  See previous notes for details.  On Prozac and doing better.  Still requires xanax.   Follow.  Get her back in soon to reassess.

## 2013-10-26 ENCOUNTER — Encounter: Payer: Self-pay | Admitting: Internal Medicine

## 2013-10-26 ENCOUNTER — Ambulatory Visit: Payer: 59 | Admitting: Internal Medicine

## 2013-11-12 ENCOUNTER — Telehealth: Payer: Self-pay | Admitting: Internal Medicine

## 2013-11-12 NOTE — Telephone Encounter (Signed)
Pt informed & will keep appt for tomorrow

## 2013-11-12 NOTE — Telephone Encounter (Signed)
We are unable to call in abx.  I feel she needs to be evaluated if persistent symptoms - so best know how to treat.  If just wanting something for cough can try mucinex DM in the am and Robitussin DM in the evening.  (if able to take these medications).  Stay hydrated.  If nasal congestion, can use saline nasal spray and I can call in Flonase.

## 2013-11-12 NOTE — Telephone Encounter (Signed)
Pt states she has a cough, clear phlegm.  States she is here a lot and is asking if something can be called in.  Appt scheduled 12/9 @ 2p.m.  Please contact pt if something can be called in, and if so please cancel the scheduled appt.

## 2013-11-12 NOTE — Telephone Encounter (Signed)
Please advise 

## 2013-11-13 ENCOUNTER — Ambulatory Visit (INDEPENDENT_AMBULATORY_CARE_PROVIDER_SITE_OTHER): Payer: 59 | Admitting: Internal Medicine

## 2013-11-13 ENCOUNTER — Ambulatory Visit (INDEPENDENT_AMBULATORY_CARE_PROVIDER_SITE_OTHER)
Admission: RE | Admit: 2013-11-13 | Discharge: 2013-11-13 | Disposition: A | Discharge status: Home / Self Care | Payer: 59 | Source: Ambulatory Visit | Attending: Internal Medicine | Admitting: Internal Medicine

## 2013-11-13 ENCOUNTER — Encounter: Payer: Self-pay | Admitting: Internal Medicine

## 2013-11-13 VITALS — BP 110/90 | HR 98 | Temp 98.6°F | Ht 65.0 in | Wt 187.8 lb

## 2013-11-13 DIAGNOSIS — G8929 Other chronic pain: Secondary | ICD-10-CM

## 2013-11-13 DIAGNOSIS — R062 Wheezing: Secondary | ICD-10-CM

## 2013-11-13 DIAGNOSIS — R05 Cough: Secondary | ICD-10-CM

## 2013-11-13 DIAGNOSIS — M549 Dorsalgia, unspecified: Secondary | ICD-10-CM

## 2013-11-13 DIAGNOSIS — J069 Acute upper respiratory infection, unspecified: Secondary | ICD-10-CM

## 2013-11-13 MED ORDER — LEVOFLOXACIN 500 MG PO TABS: 500.0000 mg | ORAL_TABLET | Freq: Every day | ORAL | Status: DC

## 2013-11-13 NOTE — Progress Notes (Signed)
Pre-visit discussion using our clinic review tool. No additional management support is needed unless otherwise documented below in the visit note.  

## 2013-11-13 NOTE — Patient Instructions (Signed)
Saline nasal spray - flush nose at least 2-3x/day.  Levaquin - one time per day.  Flovent inhaler - 2 puffs twice a day and ventolin inhaler - 2 puffs up to 4x/day.

## 2013-11-16 ENCOUNTER — Telehealth: Payer: Self-pay | Admitting: Internal Medicine

## 2013-11-16 DIAGNOSIS — R9389 Abnormal findings on diagnostic imaging of other specified body structures: Secondary | ICD-10-CM

## 2013-11-16 NOTE — Telephone Encounter (Signed)
Order placed for CT chest.  Pt notified of cxr results and need for f/u chest ct.

## 2013-11-17 ENCOUNTER — Encounter: Payer: Self-pay | Admitting: Internal Medicine

## 2013-11-17 NOTE — Assessment & Plan Note (Signed)
Cough and congestion as outlined.  Treat with levaquin as directed.  Flonase nasal spray and saline nasal flushes as directed.  Mucinex/robitussin as directed.  Steroid inhaler and rescue inhaler as directed.  Follow.  Check CXR.

## 2013-11-17 NOTE — Assessment & Plan Note (Signed)
Has seen Dr Jeral Fruit and Dr Lovell Sheehan.  Has known degenerative disc disease and reported spinal stenosis.   Has had injections and PT.   Saw Dr Donette Larry - [facility].  Waiting for call regarding further w/up.

## 2013-11-17 NOTE — Progress Notes (Signed)
  Subjective:    Patient ID: Bobbe Medico, female    DOB: 1955-12-08, 57 y.o.   MRN: [id number]  Cough Associated symptoms include headaches.  Headache  Associated symptoms include coughing.  57 year old female with past history of depression, anxiety, panic attacks, chronic back pain and hypercholesterolemia.  She comes in today as a work in with concerns regarding increased cough and congestion.  She reports symptoms started one week ago.  Increased congestion - productive of colored mucus.  Increased drainage.  Some wheezing.  No nausea or vomiting.  No bowel change.     Past Medical History  Diagnosis Date  . Arthritis   . Depression   . Allergy   . Heart murmur   . Hypercholesterolemia   . Hx: UTI (urinary tract infection)   . Panic attacks     H/O  . Hx of colonic polyp   . Anxiety   . Chronic back pain     degenerative disc disease    Outpatient Encounter Prescriptions as of 11/13/2013  Medication Sig  . ALPRAZolam (XANAX) 1 MG tablet Take 1 tablet (1 mg total) by mouth 2 (two) times daily as needed for sleep.  Marland Kitchen aspirin 81 MG tablet Take 81 mg by mouth daily.   Marland Kitchen FLUoxetine (PROZAC) 20 MG tablet Take 1 tablet (20 mg total) by mouth daily.  . hydrochlorothiazide (HYDRODIURIL) 25 MG tablet Take 0.5 tablets (12.5 mg total) by mouth daily.  . promethazine (PHENERGAN) 25 MG tablet Take 1 tablet (25 mg total) by mouth 2 (two) times daily as needed for nausea.  . Pseudoephedrine HCl 240 MG TB24 Take by mouth as needed.  Marland Kitchen levofloxacin (LEVAQUIN) 500 MG tablet Take 1 tablet (500 mg total) by mouth daily.    Review of Systems  Respiratory: Positive for cough.   Neurological: Positive for headaches.  Patient denies any headache, lightheadedness or dizziness.   No chest pain, tightness or palpitations.  Some increased congestion and cough.  Cough productive of colored mucus.   Breathing relatively stable.  Some wheezing.  No nausea or vomiting.  No abdominal pain or cramping.  No  bowel change, such as diarrhea.  Has taken tylenol and ibuprofen.  Husband has been sick recently.           Objective:   Physical Exam  Filed Vitals:   11/13/13 1356  BP: 110/90  Pulse: 98  Temp: 98.6 F (37 C)   Blood pressure recheck:  72/28  57 year old female in no acute distress.   HEENT:  Nares- slightly erythematous turbinates.   Oropharynx - without lesions. NECK:  Supple.  Nontender.  No audible bruit.  HEART:  Appears to be regular. LUNGS:  No crackles.  Some minimal congestion and wheezing.  Appeared to clear with coughing.   Respirations even and unlabored.  RADIAL PULSE:  Equal bilaterally.        Assessment & Plan:  HEALTH MAINTENANCE.  Physical scheduled.

## 2013-11-19 ENCOUNTER — Ambulatory Visit: Payer: Self-pay | Admitting: Internal Medicine

## 2013-11-27 ENCOUNTER — Telehealth: Payer: Self-pay | Admitting: Internal Medicine

## 2013-11-27 NOTE — Telephone Encounter (Signed)
Pt notified of results

## 2013-11-27 NOTE — Telephone Encounter (Signed)
The patient is wanting CT results .

## 2013-12-19 ENCOUNTER — Ambulatory Visit: Payer: 59 | Admitting: Internal Medicine

## 2013-12-24 ENCOUNTER — Encounter: Payer: Self-pay | Admitting: Internal Medicine

## 2013-12-24 ENCOUNTER — Ambulatory Visit (INDEPENDENT_AMBULATORY_CARE_PROVIDER_SITE_OTHER): Payer: 59 | Admitting: Internal Medicine

## 2013-12-24 VITALS — BP 104/80 | HR 102 | Temp 98.7°F | Ht 65.0 in | Wt 180.8 lb

## 2013-12-24 DIAGNOSIS — E7800 Pure hypercholesterolemia, unspecified: Secondary | ICD-10-CM

## 2013-12-24 DIAGNOSIS — F32A Depression, unspecified: Secondary | ICD-10-CM

## 2013-12-24 DIAGNOSIS — J069 Acute upper respiratory infection, unspecified: Secondary | ICD-10-CM

## 2013-12-24 DIAGNOSIS — F411 Generalized anxiety disorder: Secondary | ICD-10-CM

## 2013-12-24 DIAGNOSIS — F419 Anxiety disorder, unspecified: Secondary | ICD-10-CM

## 2013-12-24 DIAGNOSIS — M549 Dorsalgia, unspecified: Secondary | ICD-10-CM

## 2013-12-24 DIAGNOSIS — G8929 Other chronic pain: Secondary | ICD-10-CM

## 2013-12-24 DIAGNOSIS — I10 Essential (primary) hypertension: Secondary | ICD-10-CM

## 2013-12-24 DIAGNOSIS — F329 Major depressive disorder, single episode, unspecified: Secondary | ICD-10-CM

## 2013-12-24 DIAGNOSIS — F3289 Other specified depressive episodes: Secondary | ICD-10-CM

## 2013-12-24 MED ORDER — FLUOXETINE HCL (PMDD) 10 MG PO CAPS: ORAL_CAPSULE | ORAL | Status: DC

## 2013-12-24 MED ORDER — MELOXICAM 7.5 MG PO TABS: 7.5000 mg | ORAL_TABLET | Freq: Every day | ORAL | Status: DC

## 2013-12-24 MED ORDER — AZITHROMYCIN 250 MG PO TABS: ORAL_TABLET | ORAL | Status: DC

## 2013-12-24 MED ORDER — ALPRAZOLAM 1 MG PO TABS: 1.0000 mg | ORAL_TABLET | Freq: Two times a day (BID) | ORAL | Status: DC | PRN

## 2013-12-24 MED ORDER — FLUTICASONE PROPIONATE 50 MCG/ACT NA SUSP: 2.0000 | Freq: Every day | NASAL | Status: DC

## 2013-12-24 NOTE — Progress Notes (Signed)
Pre-visit discussion using our clinic review tool. No additional management support is needed unless otherwise documented below in the visit note.  

## 2013-12-24 NOTE — Patient Instructions (Signed)
Saline nasal spray - flush nose at least 2-3x/day.  Flonase nasal spray - 2 sprays each nostril one time per day. (do this in the evening).   Mucinex DM in the am and Robitussin DM in the evening.    Continue blood pressure medication as you are doing.

## 2013-12-26 ENCOUNTER — Encounter: Payer: Self-pay | Admitting: Internal Medicine

## 2013-12-26 NOTE — Assessment & Plan Note (Signed)
On no medication.  Low cholesterol diet.  Follow.

## 2013-12-26 NOTE — Assessment & Plan Note (Signed)
Cough and congestion as outlined.   Flonase nasal spray and saline nasal flushes as directed.  Mucinex/robitussin as directed.  Steroid inhaler and rescue inhaler as directed.  Zpak as directed.  Follow.

## 2013-12-26 NOTE — Assessment & Plan Note (Signed)
Blood pressure ok now.  Follow metabolic panel.  Follow.    

## 2013-12-26 NOTE — Assessment & Plan Note (Signed)
Has seen Dr Joya Salm and Dr Arnoldo Morale.  Has known degenerative disc disease and reported spinal stenosis.   Has had injections and PT.   Saw Dr Jenne Campus - [facility].  Was not felt to be a surgical candidate.  Discussed mobic.  She wants to try.  Monitor for side effects.

## 2013-12-26 NOTE — Progress Notes (Signed)
Subjective:    Patient ID: Angel Riddle, female    DOB: [birth year], 58 y.o.   MRN: [id number]  URI  Associated symptoms include coughing and headaches.  Cough Associated symptoms include headaches.  Headache  Associated symptoms include coughing.  58 year old female with past history of depression, anxiety, panic attacks, chronic back pain and hypercholesterolemia.  She comes in today as a work in with concerns regarding increased cough and congestion.  She reports symptoms started several days ago.   States previous cough and congestion had improved.  She reported feeling like dust in her throat.  Some hoarseness.  No sore throat.  Increased drainage which increases cough.  No documented fever.  No nausea or vomiting.  Increased stress.  Husband has been diagnosed with lung cancer.  Discussed increasing prozac dosage.  Uses xanax.      Past Medical History  Diagnosis Date  . Arthritis   . Depression   . Allergy   . Heart murmur   . Hypercholesterolemia   . Hx: UTI (urinary tract infection)   . Panic attacks     H/O  . Hx of colonic polyp   . Anxiety   . Chronic back pain     degenerative disc disease    Outpatient Encounter Prescriptions as of 12/24/2013  Medication Sig  . ALPRAZolam (XANAX) 1 MG tablet Take 1 tablet (1 mg total) by mouth 2 (two) times daily as needed for sleep.  Marland Kitchen aspirin 81 MG tablet Take 81 mg by mouth daily.   . hydrochlorothiazide (HYDRODIURIL) 25 MG tablet Take 0.5 tablets (12.5 mg total) by mouth daily.  . promethazine (PHENERGAN) 25 MG tablet Take 1 tablet (25 mg total) by mouth 2 (two) times daily as needed for nausea.  . Pseudoephedrine HCl 240 MG TB24 Take by mouth as needed.  . [DISCONTINUED] ALPRAZolam (XANAX) 1 MG tablet Take 1 tablet (1 mg total) by mouth 2 (two) times daily as needed for sleep.  . [DISCONTINUED] FLUoxetine (PROZAC) 20 MG tablet Take 1 tablet (20 mg total) by mouth daily.  Marland Kitchen azithromycin (ZITHROMAX) 250 MG tablet Take two tablet  x 1 day and then one tablet q day x 4 more days.  . Fluoxetine HCl, PMDD, 10 MG CAPS Take three capsules q day  . fluticasone (FLONASE) 50 MCG/ACT nasal spray Place 2 sprays into both nostrils daily.  . meloxicam (MOBIC) 7.5 MG tablet Take 1 tablet (7.5 mg total) by mouth daily.  . [DISCONTINUED] levofloxacin (LEVAQUIN) 500 MG tablet Take 1 tablet (500 mg total) by mouth daily.    Review of Systems  Respiratory: Positive for cough.   Neurological: Positive for headaches.  Patient denies any headache, lightheadedness or dizziness.   No chest pain, tightness or palpitations.  Some increased congestion and cough.  Increased drainage.   Breathing stable.   No nausea or vomiting.  No abdominal pain or cramping.  No bowel change, such as diarrhea.  Has taken tylenol and ibuprofen.  Husband has been sick recently.  Diagnosed with lung cancer.  Increased stress.           Objective:   Physical Exam  Filed Vitals:   12/24/13 1458  BP: 104/80  Pulse: 102  Temp: 98.7 F (19.36 C)   58 year old female in no acute distress.   HEENT:  Nares- slightly erythematous turbinates.   Oropharynx - without lesions.  No significant tenderness to palpation over the sinuses.  NECK:  Supple.  Nontender.  No  audible bruit.  HEART:  Appears to be regular. LUNGS:  No crackles.   Respirations even and unlabored.  ABDOMEN:  Soft.  Non tender.  No audible abdominal bruit.  RADIAL PULSE:  Equal bilaterally.  EXTREMITIES:  No increased edema.        Assessment & Plan:  HEALTH MAINTENANCE.  Physical scheduled.    I spent 25 minutes with the patient and more than 50% of the time was spent in consultation regarding the above.

## 2013-12-26 NOTE — Assessment & Plan Note (Signed)
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  Increased stress and anxiety.  See previous notes for details.  Husband recently diagnosed with lung cancer.  On Prozac.  Still requires xanax.  Increase prozac to 30mg  q day.  Follow.

## 2013-12-26 NOTE — Assessment & Plan Note (Signed)
Increased stress as outlined in last note and this note.   On prozac.  Increase to 30mg  as outlined.  Xanax as needed.  Follow.

## 2014-01-10 ENCOUNTER — Encounter: Payer: Self-pay | Admitting: Internal Medicine

## 2014-01-21 ENCOUNTER — Other Ambulatory Visit: Payer: Self-pay | Admitting: *Deleted

## 2014-01-21 ENCOUNTER — Telehealth: Payer: Self-pay | Admitting: Emergency Medicine

## 2014-01-21 MED ORDER — MELOXICAM 7.5 MG PO TABS: 7.5000 mg | ORAL_TABLET | Freq: Every day | ORAL | Status: DC

## 2014-01-21 NOTE — Telephone Encounter (Signed)
Please refill, meloxicam (MOBIC) 7.5 MG tablet. Pt at pharmacy now [facility]), husband had port put in this morning for lung cancer. Doesn't want to leave and then return. Please advise.

## 2014-01-21 NOTE — Telephone Encounter (Signed)
Rx sent electronically.  

## 2014-02-08 ENCOUNTER — Ambulatory Visit: Payer: 59 | Admitting: Internal Medicine

## 2014-02-18 ENCOUNTER — Ambulatory Visit (INDEPENDENT_AMBULATORY_CARE_PROVIDER_SITE_OTHER): Payer: 59 | Admitting: Internal Medicine

## 2014-02-18 ENCOUNTER — Encounter: Payer: Self-pay | Admitting: Internal Medicine

## 2014-02-18 VITALS — BP 104/78 | HR 77 | Temp 98.3°F | Resp 16 | Wt 180.8 lb

## 2014-02-18 DIAGNOSIS — J069 Acute upper respiratory infection, unspecified: Secondary | ICD-10-CM

## 2014-02-18 DIAGNOSIS — J020 Streptococcal pharyngitis: Secondary | ICD-10-CM

## 2014-02-18 LAB — POCT RAPID STREP A (OFFICE): Rapid Strep A Screen: NEGATIVE

## 2014-02-18 MED ORDER — AZITHROMYCIN 250 MG PO TABS: ORAL_TABLET | ORAL | Status: DC

## 2014-02-18 MED ORDER — PREDNISONE (PAK) 10 MG PO TABS: ORAL_TABLET | ORAL | Status: DC

## 2014-02-18 MED ORDER — AZITHROMYCIN 500 MG PO TABS: 500.0000 mg | ORAL_TABLET | Freq: Every day | ORAL | Status: DC

## 2014-02-18 NOTE — Progress Notes (Signed)
Patient ID: Angel Riddle, female   DOB: Jun 27, 1956, 58 y.o.   MRN: [id number]  Patient Active Problem List   Diagnosis Date Noted  . URI (upper respiratory infection) 11/17/2013  . Numbness and tingling 10/02/2013  . Hypertension 10/02/2013  . Chronic back pain 07/18/2013  . Depression 07/18/2013  . Anxiety 07/18/2013  . Panic attacks 07/18/2013  . Environmental allergies 07/18/2013  . Hypercholesterolemia 07/18/2013  . Personal history of colonic polyps 07/18/2013    Subjective:  CC:   Chief Complaint  Patient presents with  . Sore Throat     some chills    HPI:   Angel Riddle is a 58 y.o. female who presents for  Sore throat since Saturday.  Strep is negative .  Lots of nasal drainage,  No fever sw Husband has lung CA .  Wants a z pack  Had sore muscles with levaquin.  Doesn't want prednisone bc she has gained so much wt (60 lbs) and doesn't want to be set back.    Right ear feels like pressure.  Has chronic sinus infection  Has a lot of stress.,  Many sick family members,  And stressed out.       Past Medical History  Diagnosis Date  . Arthritis   . Depression   . Allergy   . Heart murmur   . Hypercholesterolemia   . Hx: UTI (urinary tract infection)   . Panic attacks     H/O  . Hx of colonic polyp   . Anxiety   . Chronic back pain     degenerative disc disease    Past Surgical History  Procedure Laterality Date  . Abdominal hysterectomy  1992    partial  . Tonsillectomy and adenoidectomy  1969  . Skin surgery Left 08/27/13    Basal cell removal-left nostril       The following portions of the patient's history were reviewed and updated as appropriate: Allergies, current medications, and problem list.    Review of Systems:   Patient denies headache, fevers, malaise, unintentional weight loss, skin rash, eye pain, sinus congestion and sinus pain, sore throat, dysphagia,  hemoptysis , cough, dyspnea, wheezing, chest pain, palpitations,  orthopnea, edema, abdominal pain, nausea, melena, diarrhea, constipation, flank pain, dysuria, hematuria, urinary  Frequency, nocturia, numbness, tingling, seizures,  Focal weakness, Loss of consciousness,  Tremor, insomnia, depression, anxiety, and suicidal ideation.     History   Social History  . Marital Status: Married    Spouse Name: N/A    Number of Children: 2  . Years of Education: N/A   Occupational History  . Not on file.   Social History Main Topics  . Smoking status: Former Research scientist (life sciences)  . Smokeless tobacco: Never Used  . Alcohol Use: Yes  . Drug Use: No  . Sexual Activity: Not on file   Other Topics Concern  . Not on file   Social History Narrative  . No narrative on file    Objective:  Filed Vitals:   02/18/14 1730  BP: 104/78  Pulse: 77  Temp: 98.3 F (36.8 C)  Resp: 16     General appearance: alert, cooperative and appears stated age Ears: normal TM's and external ear canals both ears Throat: lips, mucosa, and tongue normal; teeth and gums normal Neck: no adenopathy, no carotid bruit, supple, symmetrical, trachea midline and thyroid not enlarged, symmetric, no tenderness/mass/nodules Back: symmetric, no curvature. ROM normal. No CVA tenderness. Lungs: clear to auscultation bilaterally Heart: regular rate and  rhythm, S1, S2 normal, no murmur, click, rub or gallop Abdomen: soft, non-tender; bowel sounds normal; no masses,  no organomegaly Pulses: 2+ and symmetric Skin: Skin color, texture, turgor normal. No rashes or lesions Lymph nodes: Cervical, supraclavicular, and axillary nodes normal.  Assessment and Plan:  URI (upper respiratory infection) Given long history of tobacco abuse and husband in an I/C state,  Will treat with azithromycin and decongestants.  The rapid  strep was negative. Advised to begin prednisone taper if wheezing develops.    Updated Medication List Outpatient Encounter Prescriptions as of 02/18/2014  Medication Sig  .  ALPRAZolam (XANAX) 1 MG tablet Take 1 tablet (1 mg total) by mouth 2 (two) times daily as needed for sleep.  Marland Kitchen aspirin 81 MG tablet Take 81 mg by mouth daily.   . Fluoxetine HCl, PMDD, 10 MG CAPS Take three capsules q day  . fluticasone (FLONASE) 50 MCG/ACT nasal spray Place 2 sprays into both nostrils daily.  . hydrochlorothiazide (HYDRODIURIL) 25 MG tablet Take 0.5 tablets (12.5 mg total) by mouth daily.  . meloxicam (MOBIC) 7.5 MG tablet Take 1 tablet (7.5 mg total) by mouth daily.  . promethazine (PHENERGAN) 25 MG tablet Take 1 tablet (25 mg total) by mouth 2 (two) times daily as needed for nausea.  . Pseudoephedrine HCl 240 MG TB24 Take by mouth as needed.  Marland Kitchen azithromycin (ZITHROMAX) 250 MG tablet Take two tablet x 1 day and then one tablet q day x 4 more days.  Marland Kitchen azithromycin (ZITHROMAX) 500 MG tablet Take 1 tablet (500 mg total) by mouth daily.  . predniSONE (STERAPRED UNI-PAK) 10 MG tablet 6 tablets on Day 1 , then reduce by 1 tablet daily until gone  . [DISCONTINUED] azithromycin (ZITHROMAX) 250 MG tablet Take two tablet x 1 day and then one tablet q day x 4 more days.     Orders Placed This Encounter  Procedures  . POCT rapid strep A    No Follow-up on file.

## 2014-02-18 NOTE — Patient Instructions (Signed)
You have a viral  Syndrome .  The post nasal drip is causing your sore throat.  Your strep test was negative and your throat looks fine   Lavage your sinuses twice daly with Simply saline nasal spray or a Nettir pot .  Use Theraflu and  Sudafed PE 10 to 30 every 8 hours to manage the achiness,  Runny nose  and congestion.  Gargle with salt water often for the sore throat.  If the throat is no better  In  2 to 3 days OR  if you develop T > 100.4,  Green nasal discharge,  Or facial pain,  Start the Z pack Please take a probiotic ( Align, Floraque or Culturelle) for 2 weeks if you start the antibiotic to prevent a serious antibiotic associated diarrhea  Called" clostridium dificile colitis" ( should also help prevent   vaginal yeast infection)   If you develop shortness of breath,  Wheezing or persistent cough,  You should start the prednisone taper and make an appt to be seen again .

## 2014-02-19 NOTE — Assessment & Plan Note (Addendum)
Given long history of tobacco abuse and husband in an I/C state,  Will treat with azithromycin and decongestants.  Flu and strep negative. Adding prednisone if wheezing develops.

## 2014-03-07 ENCOUNTER — Ambulatory Visit (INDEPENDENT_AMBULATORY_CARE_PROVIDER_SITE_OTHER): Payer: 59 | Admitting: Internal Medicine

## 2014-03-07 ENCOUNTER — Encounter: Payer: Self-pay | Admitting: Internal Medicine

## 2014-03-07 VITALS — BP 110/80 | HR 80 | Temp 98.5°F | Ht 65.0 in | Wt 176.5 lb

## 2014-03-07 DIAGNOSIS — M549 Dorsalgia, unspecified: Secondary | ICD-10-CM

## 2014-03-07 DIAGNOSIS — R198 Other specified symptoms and signs involving the digestive system and abdomen: Secondary | ICD-10-CM

## 2014-03-07 DIAGNOSIS — G8929 Other chronic pain: Secondary | ICD-10-CM

## 2014-03-07 DIAGNOSIS — F411 Generalized anxiety disorder: Secondary | ICD-10-CM

## 2014-03-07 DIAGNOSIS — F410 Panic disorder [episodic paroxysmal anxiety] without agoraphobia: Secondary | ICD-10-CM

## 2014-03-07 DIAGNOSIS — R109 Unspecified abdominal pain: Secondary | ICD-10-CM

## 2014-03-07 DIAGNOSIS — F329 Major depressive disorder, single episode, unspecified: Secondary | ICD-10-CM

## 2014-03-07 DIAGNOSIS — I10 Essential (primary) hypertension: Secondary | ICD-10-CM

## 2014-03-07 DIAGNOSIS — F3289 Other specified depressive episodes: Secondary | ICD-10-CM

## 2014-03-07 DIAGNOSIS — Z86010 Personal history of colonic polyps: Secondary | ICD-10-CM

## 2014-03-07 DIAGNOSIS — Z9109 Other allergy status, other than to drugs and biological substances: Secondary | ICD-10-CM

## 2014-03-07 DIAGNOSIS — F32A Depression, unspecified: Secondary | ICD-10-CM

## 2014-03-07 DIAGNOSIS — E7800 Pure hypercholesterolemia, unspecified: Secondary | ICD-10-CM

## 2014-03-07 DIAGNOSIS — F419 Anxiety disorder, unspecified: Secondary | ICD-10-CM

## 2014-03-07 MED ORDER — ESOMEPRAZOLE MAGNESIUM 40 MG PO CPDR
40.0000 mg | DELAYED_RELEASE_CAPSULE | Freq: Every day | ORAL | Status: DC
Start: 2014-03-07 — End: 2015-07-30

## 2014-03-07 NOTE — Progress Notes (Signed)
Subjective:    Patient ID: Angel Riddle, female    DOB: 1956-08-11, 58 y.o.   MRN: [id number]  HPI 58 year old female with past history of depression, anxiety, panic attacks, chronic back pain and hypercholesterolemia.  She comes in today for a scheduled follow up.   Has chronic back pain and has seen Dr Joya Salm and Dr Arnoldo Morale.  Has had injections and physical therapy.  Saw Dr Jenne Campus for further evaluation.  Felt to be arthritis.  Taking Mobic.  This helps.  Has had increased stress and anxiety.  Was previously on zoloft.  Per her report, dose increased and she did not tolerate. She was tried on cymbalta.  This helped, but could not afford.  See previous notes for details.  Was started on Prozac.  States she feels better.  Coping better.  Still requires xanax, but has decreased the amount taking.   Eating and drinking.  Does report some increased acid reflux.  Also reports some abdominal discomfort and abdominal fullness with associated nausea.  Feels sick after eating.     Past Medical History  Diagnosis Date  . Arthritis   . Depression   . Allergy   . Heart murmur   . Hypercholesterolemia   . Hx: UTI (urinary tract infection)   . Panic attacks     H/O  . Hx of colonic polyp   . Anxiety   . Chronic back pain     degenerative disc disease    Outpatient Encounter Prescriptions as of 03/07/2014  Medication Sig  . ALPRAZolam (XANAX) 1 MG tablet Take 1 tablet (1 mg total) by mouth 2 (two) times daily as needed for sleep.  Marland Kitchen aspirin 81 MG tablet Take 81 mg by mouth 2 (two) times a week.   . Fluoxetine HCl, PMDD, 10 MG CAPS Take three capsules q day  . hydrochlorothiazide (HYDRODIURIL) 25 MG tablet Take 0.5 tablets (12.5 mg total) by mouth daily.  . meloxicam (MOBIC) 7.5 MG tablet Take 1 tablet (7.5 mg total) by mouth daily.  . promethazine (PHENERGAN) 25 MG tablet Take 1 tablet (25 mg total) by mouth 2 (two) times daily as needed for nausea.  . Pseudoephedrine HCl 240 MG TB24 Take  by mouth as needed.  . [DISCONTINUED] azithromycin (ZITHROMAX) 250 MG tablet Take two tablet x 1 day and then one tablet q day x 4 more days.  . [DISCONTINUED] azithromycin (ZITHROMAX) 500 MG tablet Take 1 tablet (500 mg total) by mouth daily.  . [DISCONTINUED] fluticasone (FLONASE) 50 MCG/ACT nasal spray Place 2 sprays into both nostrils daily.  . [DISCONTINUED] predniSONE (STERAPRED UNI-PAK) 10 MG tablet 6 tablets on Day 1 , then reduce by 1 tablet daily until gone    Review of Systems Patient denies any headache, lightheadedness or dizziness.  No significant sinus or allergy symptoms currently.  Does have some sinus drainage.   No chest pain, tightness or palpitations.  No increased shortness of breath, cough or congestion.  Breathing stable.  Increased acid reflux.  Some abdominal discomfort, abdominal fullness and nausea.  Reports feeling sick after eating.  No bowel change, such as diarrhea, constipation, BRBPR or melana.  No urine change. Persistent back pain as outlined.   Better on Mobic.  Increased stress.  Feels she is handling things relatively well.          Objective:   Physical Exam  Filed Vitals:   03/07/14 1406  BP: 110/80  Pulse: 80  Temp: 98.5 F (36.9 C)  Blood pressure recheck:  68/59  58 year old female in no acute distress.   HEENT:  Nares- clear.  Oropharynx - without lesions. NECK:  Supple.  Nontender.  No audible bruit.  HEART:  Appears to be regular. LUNGS:  No crackles or wheezing audible.  Respirations even and unlabored.  RADIAL PULSE:  Equal bilaterally.  ABDOMEN:  Soft, nontender.  Bowel sounds present and normal.  No audible abdominal bruit.   EXTREMITIES:  No increased edema present.  DP pulses palpable and equal bilaterally.        Assessment & Plan:  HEALTH MAINTENANCE.  Schedule a physical.   Mammogram 06/07/13 - [facility].    I spent 25 minutes with the patient and more than 50% of the time was spent in consultation regarding the above.

## 2014-03-07 NOTE — Assessment & Plan Note (Signed)
Blood pressure ok now.  Follow metabolic panel.  Follow.    

## 2014-03-07 NOTE — Progress Notes (Signed)
Pre-visit discussion using our clinic review tool. No additional management support is needed unless otherwise documented below in the visit note.  

## 2014-03-10 ENCOUNTER — Encounter: Payer: Self-pay | Admitting: Internal Medicine

## 2014-03-10 DIAGNOSIS — R198 Other specified symptoms and signs involving the digestive system and abdomen: Secondary | ICD-10-CM | POA: Insufficient documentation

## 2014-03-10 NOTE — Assessment & Plan Note (Signed)
Stable.  Follow.   

## 2014-03-10 NOTE — Assessment & Plan Note (Signed)
Has the abdominal fullness, abdominal discomfort and nausea as outlined.  nexium as directed.  Check liver panel, amylase and lipase.  Check abdominal ultrasound.  Further w/up pending.

## 2014-03-10 NOTE — Assessment & Plan Note (Signed)
Has a history of panic attacks.  Increased anxiety and stress.  On prozac now.   Doing better.  Feels better.   Continue xanax as needed.

## 2014-03-10 NOTE — Assessment & Plan Note (Signed)
Increased stress as outlined in previous notes.   On prozac.  Xanax as needed.  Follow.

## 2014-03-10 NOTE — Assessment & Plan Note (Signed)
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  Increased stress and anxiety.  See previous notes for details.  Husband recently diagnosed with lung cancer.  On Prozac.  Still requires xanax.  On prozac 30mg  q day.  Follow.

## 2014-03-10 NOTE — Assessment & Plan Note (Signed)
Has seen Dr Joya Salm and Dr Arnoldo Morale.  Has known degenerative disc disease and reported spinal stenosis.   Has had injections and PT.   Saw Dr Jenne Campus - [facility].  Was not felt to be a surgical candidate.  mobic helps.  Discussed the possibility of Mobic causing GI side effects.

## 2014-03-10 NOTE — Assessment & Plan Note (Signed)
On no medication.  Low cholesterol diet.  Follow.

## 2014-03-10 NOTE — Assessment & Plan Note (Signed)
Has had colonoscopy.   

## 2014-03-11 ENCOUNTER — Ambulatory Visit: Payer: Self-pay | Admitting: Internal Medicine

## 2014-03-11 ENCOUNTER — Telehealth: Payer: Self-pay | Admitting: Internal Medicine

## 2014-03-11 NOTE — Telephone Encounter (Signed)
States her results should be to [place] by the end of the day today from [facility] [place] that was done today.  Pt wants to ensure Dr. Nicki Reaper receives.  Pt will be coming for blood work 4/9

## 2014-03-14 ENCOUNTER — Other Ambulatory Visit (INDEPENDENT_AMBULATORY_CARE_PROVIDER_SITE_OTHER): Payer: 59

## 2014-03-14 ENCOUNTER — Telehealth: Payer: Self-pay | Admitting: Internal Medicine

## 2014-03-14 DIAGNOSIS — K8020 Calculus of gallbladder without cholecystitis without obstruction: Secondary | ICD-10-CM

## 2014-03-14 DIAGNOSIS — F419 Anxiety disorder, unspecified: Secondary | ICD-10-CM

## 2014-03-14 DIAGNOSIS — R109 Unspecified abdominal pain: Secondary | ICD-10-CM

## 2014-03-14 DIAGNOSIS — E7800 Pure hypercholesterolemia, unspecified: Secondary | ICD-10-CM

## 2014-03-14 DIAGNOSIS — F411 Generalized anxiety disorder: Secondary | ICD-10-CM

## 2014-03-14 DIAGNOSIS — I10 Essential (primary) hypertension: Secondary | ICD-10-CM

## 2014-03-14 LAB — LIPID PANEL
Cholesterol: 309 mg/dL — ABNORMAL HIGH (ref 0–200)
HDL: 46.4 mg/dL (ref >39.00)
LDL Cholesterol: 218 mg/dL — ABNORMAL HIGH (ref 0–99)
Total CHOL/HDL Ratio: 7
Triglycerides: 221 mg/dL — ABNORMAL HIGH (ref 0.0–149.0)
VLDL: 44.2 mg/dL — ABNORMAL HIGH (ref 0.0–40.0)

## 2014-03-14 LAB — CBC WITH DIFFERENTIAL/PLATELET
Basophils Absolute: 0 10*3/uL (ref 0.0–0.1)
Basophils Relative: 0.3 % (ref 0.0–3.0)
EOS ABS: 0.1 10*3/uL (ref 0.0–0.7)
Eosinophils Relative: 1.5 % (ref 0.0–5.0)
HCT: 39.8 % (ref 36.0–46.0)
Hemoglobin: 13.7 g/dL (ref 12.0–15.0)
Lymphocytes Relative: 39.6 % (ref 12.0–46.0)
Lymphs Abs: 2.3 10*3/uL (ref 0.7–4.0)
MCHC: 34.5 g/dL (ref 30.0–36.0)
MCV: 87.3 fl (ref 78.0–100.0)
Monocytes Absolute: 0.4 10*3/uL (ref 0.1–1.0)
Monocytes Relative: 7.5 % (ref 3.0–12.0)
NEUTROS PCT: 51.1 % (ref 43.0–77.0)
Neutro Abs: 3 10*3/uL (ref 1.4–7.7)
Platelets: 255 10*3/uL (ref 150.0–400.0)
RBC: 4.56 Mil/uL (ref 3.87–5.11)
RDW: 12.5 % (ref 11.5–14.6)
WBC: 5.8 10*3/uL (ref 4.5–10.5)

## 2014-03-14 LAB — BASIC METABOLIC PANEL
BUN: 14 mg/dL (ref 6–23)
CO2: 28 mEq/L (ref 19–32)
Calcium: 9.5 mg/dL (ref 8.4–10.5)
Chloride: 104 mEq/L (ref 96–112)
Creatinine, Ser: 0.8 mg/dL (ref 0.4–1.2)
GFR: 81.8 mL/min (ref >60.00)
Glucose, Bld: 98 mg/dL (ref 70–99)
Potassium: 3.9 mEq/L (ref 3.5–5.1)
Sodium: 140 mEq/L (ref 135–145)

## 2014-03-14 LAB — HEPATIC FUNCTION PANEL
ALBUMIN: 4.2 g/dL (ref 3.5–5.2)
ALK PHOS: 88 U/L (ref 39–117)
ALT: 12 U/L (ref 0–35)
AST: 24 U/L (ref 0–37)
Bilirubin, Direct: 0.1 mg/dL (ref 0.0–0.3)
Total Bilirubin: 0.9 mg/dL (ref 0.3–1.2)
Total Protein: 6.9 g/dL (ref 6.0–8.3)

## 2014-03-14 LAB — TSH: TSH: 1.28 u[IU]/mL (ref 0.35–5.50)

## 2014-03-14 LAB — AMYLASE: Amylase: 25 U/L — ABNORMAL LOW (ref 27–131)

## 2014-03-14 LAB — LIPASE: Lipase: 25 U/L (ref 11.0–59.0)

## 2014-03-14 NOTE — Telephone Encounter (Signed)
Please call and notify pt that her abdominal ultrasound revealed a solitary gallstone.  No evidence of acute inflammation at the time of the ultrasound test.  Incidentally noted was a left kidney stone - non obstructing.  No other abnormality visualized.  Since she has discomfort after eating and abdominal fullness, etc - I recommend a referral to surgery for further evaluation with the question of any further intervention for her gallbladder.  If she does not have a preference to which surgeon she sees, I would like to refer her to Dr Bary Castilla.  Let me know if any problems.  Let me know if she agrees to referral and I will place the order for the referral.    Dr Nicki Reaper

## 2014-03-14 NOTE — Telephone Encounter (Signed)
No answer at home number, left message to return call on cell phone.

## 2014-03-15 ENCOUNTER — Other Ambulatory Visit: Payer: Self-pay | Admitting: Internal Medicine

## 2014-03-15 DIAGNOSIS — E7800 Pure hypercholesterolemia, unspecified: Secondary | ICD-10-CM

## 2014-03-15 NOTE — Telephone Encounter (Signed)
Called and spoke with patient informed her of results, she verbally agreed understanding. Would like to go ahead with referral to Dr. Bary Castilla. But would like to make it known that she will be out of town next Friday for the weekend at the beach.

## 2014-03-15 NOTE — Progress Notes (Signed)
Order placed for f/u liver panel.  

## 2014-03-16 NOTE — Telephone Encounter (Signed)
Order placed for surgery referral.  

## 2014-03-29 ENCOUNTER — Other Ambulatory Visit: Payer: Self-pay | Admitting: *Deleted

## 2014-04-01 ENCOUNTER — Ambulatory Visit (INDEPENDENT_AMBULATORY_CARE_PROVIDER_SITE_OTHER): Payer: 59 | Admitting: General Surgery

## 2014-04-01 ENCOUNTER — Other Ambulatory Visit: Payer: Self-pay | Admitting: General Surgery

## 2014-04-01 ENCOUNTER — Encounter: Payer: Self-pay | Admitting: General Surgery

## 2014-04-01 VITALS — BP 110/80 | HR 78 | Resp 14 | Ht 65.0 in | Wt 175.0 lb

## 2014-04-01 DIAGNOSIS — K8020 Calculus of gallbladder without cholecystitis without obstruction: Secondary | ICD-10-CM

## 2014-04-01 MED ORDER — FLUOXETINE HCL (PMDD) 10 MG PO CAPS: ORAL_CAPSULE | ORAL | Status: DC

## 2014-04-01 NOTE — Progress Notes (Signed)
Patient ID: Angel Riddle, female   DOB: [birth year], 58 y.o.   MRN: [id number]  Chief Complaint  Patient presents with  . Other    evaluation for gallstones    HPI Angel Riddle is a 58 y.o. female who presents for an evaluation of gallstones. The patient states she has had nausea for the last 6 months that has been ongoing.The nausea is usually at night prior to when she retires for bed. When she does have pain it's located in the right upper outer quadrant. The pain is described as sharp. Greasy and spice foods make it worse.  The patient reports occasionally she will experience some mild difficulty with swallowing, pointing to the top of the manubrium describing food seemed to slow down. No coughing or need to regurgitate food as reported on questioning. She has a history of problems with constipation that has been ongoing through her life.  The patient's husband has just completed treatment for lung cancer.  HPI  Past Medical History  Diagnosis Date  . Arthritis   . Depression   . Allergy   . Heart murmur   . Hypercholesterolemia   . Hx: UTI (urinary tract infection)   . Panic attacks     H/O  . Hx of colonic polyp   . Anxiety   . Chronic back pain     degenerative disc disease  . Gallstones     Past Surgical History  Procedure Laterality Date  . Abdominal hysterectomy  1992    partial  . Tonsillectomy and adenoidectomy  1969  . Skin surgery Left 08/27/13    Basal cell removal-left nostril  . Foot surgery Right 2009  . Cataract extraction Bilateral 2010,2011    Family History  Problem Relation Age of Onset  . Hyperlipidemia Mother   . Hypertension Mother   . Alcohol abuse Father   . Hyperlipidemia Father   . Heart disease Father   . Diabetes Father   . Hyperlipidemia Sister   . Lung cancer Brother   . Hyperlipidemia Brother     Social History History  Substance Use Topics  . Smoking status: Former Research scientist (life sciences)  . Smokeless tobacco: Never Used  . Alcohol  Use: Yes    Allergies  Allergen Reactions  . Levaquin [Levofloxacin In D5w]     Muscle pain  . Ivp Dye [Iodinated Diagnostic Agents]     Current Outpatient Prescriptions  Medication Sig Dispense Refill  . ALPRAZolam (XANAX) 1 MG tablet Take 1 tablet (1 mg total) by mouth 2 (two) times daily as needed for sleep.  60 tablet  1  . aspirin 81 MG tablet Take 81 mg by mouth 2 (two) times a week.       . esomeprazole (NEXIUM) 40 MG capsule Take 1 capsule (40 mg total) by mouth daily.  30 capsule  2  . Fluoxetine HCl, PMDD, 10 MG CAPS Take three capsules q day  90 each  2  . fluticasone (FLONASE) 50 MCG/ACT nasal spray Place 1 spray into both nostrils.      . hydrochlorothiazide (HYDRODIURIL) 25 MG tablet Take 0.5 tablets (12.5 mg total) by mouth daily.  30 tablet  3  . hydrocortisone (CORTEF) 10 MG tablet Take 1 tablet by mouth daily.      . meloxicam (MOBIC) 7.5 MG tablet Take 1 tablet (7.5 mg total) by mouth daily.  30 tablet  2  . promethazine (PHENERGAN) 25 MG tablet Take 1 tablet (25 mg total) by mouth 2 (two) times  daily as needed for nausea.  30 tablet  0  . Pseudoephedrine HCl 240 MG TB24 Take by mouth as needed.       No current facility-administered medications for this visit.    Review of Systems Review of Systems  Blood pressure 110/80, pulse 78, resp. rate 14, height 5\' 5"  (1.651 m), weight 175 lb (79.379 kg).  Physical Exam Physical Exam  Constitutional: She is oriented to person, place, and time. She appears well-developed and well-nourished.  Neck: Neck supple. No thyromegaly present.  Cardiovascular: Normal rate, regular rhythm and normal heart sounds.   No murmur heard. Pulmonary/Chest: Effort normal and breath sounds normal.  Abdominal: Soft. Normal appearance and bowel sounds are normal. There is no hepatosplenomegaly. There is tenderness in the right upper quadrant. No hernia.  Lymphadenopathy:    She has no cervical adenopathy.  Neurological: She is alert and  oriented to person, place, and time.  Skin: Skin is warm and dry.    Data Reviewed Abdominal ultrasound dated March 11, 2014 showed a solitary gallstone without evidence of cholecystitis. Nonobstructing left renal calculus.    Assessment    Nausea, abdominal pain likely related to biliary tract.  Mild dysphasia symptoms without history of reflux.       Plan    The patient is likely experiencing low-grade cholecystitis, although the predominance of nausea over pain, and the recent detection of dietary intolerance after being told that she had gallstones makes me concerned that a secondary source could be present. The dysphasia she reports seems fairly minimal, and she's never had a regurgitate food. I think an upper GI series should help clear any gross esophageal dysmotility orders and also be sure there were not missing an occult ulcer. Assuming this study is normal, we'll proceed with elective cholecystectomy.  The risks associated with biliary tract surgery including those of bleeding, infection and biliary tract injury were reviewed. The possibility of an open procedure was discussed.   The patient is taking a small daily dose of corticosteroid. Will look for the anesthesia department inpurt regarding any need for superphysiologic supplementation during the procedure.    Patient has been scheduled for an Upper GI at [facility] on 04/10/14 at 10:30 am. She is to arrive by 10:00 am and to have nothing to eat or drink after midnight the night before. She has been scheduled for surgery at [facility] on 04/19/14. She will pre admit by phone on 04/12/14. patient is aware of dates, time, and all instructions.   Forest Gleason Jareb Radoncic 04/01/2014, 8:08 PM

## 2014-04-01 NOTE — Patient Instructions (Addendum)
Laparoscopic Cholecystectomy Laparoscopic cholecystectomy is surgery to remove the gallbladder. The gallbladder is located in the upper right part of the abdomen, behind the liver. It is a storage sac for bile produced in the liver. Bile aids in the digestion and absorption of fats. Cholecystectomy is often done for inflammation of the gallbladder (cholecystitis). This condition is usually caused by a buildup of gallstones (cholelithiasis) in your gallbladder. Gallstones can block the flow of bile, resulting in inflammation and pain. In severe cases, emergency surgery may be required. When emergency surgery is not required, you will have time to prepare for the procedure. Laparoscopic surgery is an alternative to open surgery. Laparoscopic surgery has a shorter recovery time. Your common bile duct may also need to be examined during the procedure. If stones are found in the common bile duct, they may be removed. LET [facility] CARE PROVIDER KNOW ABOUT:  Any allergies you have.  All medicines you are taking, including vitamins, herbs, eye drops, creams, and over-the-counter medicines.  Previous problems you or members of your family have had with the use of anesthetics.  Any blood disorders you have.  Previous surgeries you have had.  Medical conditions you have. RISKS AND COMPLICATIONS Generally, this is a safe procedure. However, as with any procedure, complications can occur. Possible complications include:  Infection.  Damage to the common bile duct, nerves, arteries, veins, or other internal organs such as the stomach, liver, or intestines.  Bleeding.  A stone may remain in the common bile duct.  A bile leak from the cyst duct that is clipped when your gallbladder is removed.  The need to convert to open surgery, which requires a larger incision in the abdomen. This may be necessary if your surgeon thinks it is not safe to continue with a laparoscopic procedure. BEFORE THE  PROCEDURE  Ask your health care provider about changing or stopping any regular medicines. You will need to stop taking aspirin or blood thinners at least 5 days prior to surgery.  Do not eat or drink anything after midnight the night before surgery.  Let your health care provider know if you develop a cold or other infectious problem before surgery. PROCEDURE   You will be given medicine to make you sleep through the procedure (general anesthetic). A breathing tube will be placed in your mouth.  When you are asleep, your surgeon will make several small cuts (incisions) in your abdomen.  A thin, lighted tube with a tiny camera on the end (laparoscope) is inserted through one of the small incisions. The camera on the laparoscope sends a picture to a TV screen in the operating room. This gives the surgeon a good view inside your abdomen.  A gas will be pumped into your abdomen. This expands your abdomen so that the surgeon has more room to perform the surgery.  Other tools needed for the procedure are inserted through the other incisions. The gallbladder is removed through one of the incisions.  After the removal of your gallbladder, the incisions will be closed with stitches, staples, or skin glue. AFTER THE PROCEDURE  You will be taken to a recovery area where your progress will be checked often.  You may be allowed to go home the same day if your pain is controlled and you can tolerate liquids. Document Released: 11/22/2005 Document Revised: 09/12/2013 Document Reviewed: 07/04/2013 [facility] Patient Information 2014 [place].   Patient has been scheduled for an Upper GI at [facility] on 04/10/14 at 10:30 am.  She is to arrive by 10:00 am and to have nothing to eat or drink after midnight the night before. She has been scheduled for surgery at [facility] on 04/19/14. She will pre admit by phone on 04/12/14. patient is aware of dates, time, and all instructions.

## 2014-04-02 ENCOUNTER — Telehealth: Payer: Self-pay | Admitting: Internal Medicine

## 2014-04-02 NOTE — Telephone Encounter (Signed)
Left vm regarding appt 05/2014.  States she thinks her physical with her previous doctor was in June 2014.  When she comes in June she would like it to be a physical.  Asking [place] to check her previous records to see when her last physical was with Dr. Elsworth Soho.  States we may need to rs her appt if it will not have been a year.

## 2014-04-02 NOTE — Telephone Encounter (Signed)
Please advise-It doesn't look as if she has had a physical here

## 2014-04-03 NOTE — Telephone Encounter (Signed)
I reviewed her records from Dr Arline Asp.  I do not see where she had a physical in 6/15.  She had an appt wiith Dr Arline Asp in 4/14 and 8/14.  No mention of physical.  So, we can change her appt here to a physical (just make notation on schedule if not already done).

## 2014-04-03 NOTE — Telephone Encounter (Signed)
Pt notified and appt changed to CPE

## 2014-04-10 ENCOUNTER — Ambulatory Visit: Payer: Self-pay | Admitting: General Surgery

## 2014-04-11 ENCOUNTER — Encounter: Payer: Self-pay | Admitting: General Surgery

## 2014-04-12 ENCOUNTER — Telehealth: Payer: Self-pay | Admitting: *Deleted

## 2014-04-12 NOTE — Telephone Encounter (Signed)
Notified patient as instructed, patient pleased. Discussed follow-up appointments, patient agrees  

## 2014-04-12 NOTE — Telephone Encounter (Signed)
Message copied by Chrystie Nose on Fri Apr 12, 2014  8:04 AM ------      Message from: [place], Stearns W      Created: Thu Apr 11, 2014  9:14 PM       Please notify the patient the UGI results were reviewed and look fine. We will proceed with gallbladder removal next week as scheduled. Thanks.       ----- Message -----         From: Darrin Nipper, CMA         Sent: 04/11/2014  10:17 AM           To: Robert Bellow, MD                   ------

## 2014-04-17 ENCOUNTER — Ambulatory Visit: Payer: Self-pay | Admitting: Anesthesiology

## 2014-04-19 ENCOUNTER — Encounter: Payer: Self-pay | Admitting: General Surgery

## 2014-04-19 ENCOUNTER — Ambulatory Visit: Payer: Self-pay | Admitting: General Surgery

## 2014-04-19 DIAGNOSIS — K8010 Calculus of gallbladder with chronic cholecystitis without obstruction: Secondary | ICD-10-CM

## 2014-04-19 HISTORY — PX: CHOLECYSTECTOMY: SHX55

## 2014-04-22 LAB — PATHOLOGY REPORT

## 2014-04-23 ENCOUNTER — Encounter: Payer: Self-pay | Admitting: General Surgery

## 2014-04-26 ENCOUNTER — Other Ambulatory Visit: Payer: Self-pay | Admitting: *Deleted

## 2014-04-26 MED ORDER — MELOXICAM 7.5 MG PO TABS: 7.5000 mg | ORAL_TABLET | Freq: Every day | ORAL | Status: DC

## 2014-04-26 NOTE — Telephone Encounter (Signed)
Refilled mobic #30 with one refill

## 2014-04-26 NOTE — Telephone Encounter (Signed)
Refill

## 2014-05-01 ENCOUNTER — Ambulatory Visit (INDEPENDENT_AMBULATORY_CARE_PROVIDER_SITE_OTHER): Payer: Self-pay | Admitting: General Surgery

## 2014-05-01 ENCOUNTER — Encounter: Payer: Self-pay | Admitting: General Surgery

## 2014-05-01 VITALS — BP 102/66 | HR 74 | Resp 12 | Ht 65.0 in | Wt 176.0 lb

## 2014-05-01 DIAGNOSIS — K8020 Calculus of gallbladder without cholecystitis without obstruction: Secondary | ICD-10-CM

## 2014-05-01 NOTE — Patient Instructions (Signed)
Patient to return as needed. 

## 2014-05-01 NOTE — Progress Notes (Signed)
Patient ID: Angel Riddle, female   DOB: July 31, 1956, 58 y.o.   MRN: [id number]  Chief Complaint  Patient presents with  . Routine Post Op    HPI Angel Riddle is a 58 y.o. female.  Here today for postoperative visit. She had a laparoscopic cholecystectomy done 04-19-14. States she is doing well.  HPI  Past Medical History  Diagnosis Date  . Arthritis   . Depression   . Allergy   . Heart murmur   . Hypercholesterolemia   . Hx: UTI (urinary tract infection)   . Panic attacks     H/O  . Hx of colonic polyp   . Anxiety   . Chronic back pain     degenerative disc disease  . Gallstones     Past Surgical History  Procedure Laterality Date  . Abdominal hysterectomy  1992    partial  . Tonsillectomy and adenoidectomy  1969  . Skin surgery Left 08/27/13    Basal cell removal-left nostril  . Foot surgery Right 2009  . Cataract extraction Bilateral [id number]  . Cholecystectomy  04-19-14  . Colonoscopy  07/02/11    DR. Nicolas Sisler    Family History  Problem Relation Age of Onset  . Hyperlipidemia Mother   . Hypertension Mother   . Alcohol abuse Father   . Hyperlipidemia Father   . Heart disease Father   . Diabetes Father   . Hyperlipidemia Sister   . Lung cancer Brother   . Hyperlipidemia Brother     Social History History  Substance Use Topics  . Smoking status: Former Research scientist (life sciences)  . Smokeless tobacco: Never Used  . Alcohol Use: Yes    Allergies  Allergen Reactions  . Levaquin [Levofloxacin In D5w]     Muscle pain  . Ivp Dye [Iodinated Diagnostic Agents]     Current Outpatient Prescriptions  Medication Sig Dispense Refill  . ALPRAZolam (XANAX) 1 MG tablet Take 1 tablet (1 mg total) by mouth 2 (two) times daily as needed for sleep.  60 tablet  1  . aspirin 81 MG tablet Take 81 mg by mouth 2 (two) times a week.       . esomeprazole (NEXIUM) 40 MG capsule Take 1 capsule (40 mg total) by mouth daily.  30 capsule  2  . Fluoxetine HCl, PMDD, 10 MG CAPS Take three  capsules q day  90 each  2  . fluticasone (FLONASE) 50 MCG/ACT nasal spray Place 1 spray into both nostrils.      . hydrochlorothiazide (HYDRODIURIL) 25 MG tablet Take 0.5 tablets (12.5 mg total) by mouth daily.  30 tablet  3  . hydrocortisone (CORTEF) 10 MG tablet Take 1 tablet by mouth daily.      . meloxicam (MOBIC) 7.5 MG tablet Take 1 tablet (7.5 mg total) by mouth daily.  30 tablet  1  . promethazine (PHENERGAN) 25 MG tablet Take 1 tablet (25 mg total) by mouth 2 (two) times daily as needed for nausea.  30 tablet  0  . Pseudoephedrine HCl 240 MG TB24 Take by mouth as needed.      . traMADol (ULTRAM) 50 MG tablet        No current facility-administered medications for this visit.    Review of Systems Review of Systems  Constitutional: Negative.   Respiratory: Negative.   Cardiovascular: Negative.     Blood pressure 102/66, pulse 74, resp. rate 12, height 5\' 5"  (1.651 m), weight 176 lb (79.833 kg).  Physical Exam Physical Exam  Constitutional: She is oriented to person, place, and time. She appears well-developed and well-nourished.  Cardiovascular: Normal rate, regular rhythm and normal heart sounds.   Abdominal:  Port site looks clean and healing well. Little thickening at the epigastric site.  Neurological: She is alert and oriented to person, place, and time.  Skin: Skin is warm and dry.    Data Reviewed Pathology: Chronic cholecystitis and cholelithiasis. Cholesterolosis.    Specimen Collected: 04/19/14   Intraoperative cholangiograms did not show evidence of retained stones.   Assessment    Doing well status post cholecystectomy.    Plan     The patient will increase her activity as tolerated. Followup here will be on an as-needed basis.    PCP: Verlon Au Dovie Kapusta 05/02/2014, 5:01 PM

## 2014-05-09 ENCOUNTER — Ambulatory Visit (INDEPENDENT_AMBULATORY_CARE_PROVIDER_SITE_OTHER): Payer: 59 | Admitting: Internal Medicine

## 2014-05-09 ENCOUNTER — Other Ambulatory Visit (HOSPITAL_COMMUNITY)
Admission: RE | Admit: 2014-05-09 | Discharge: 2014-05-09 | Disposition: A | Discharge status: Home / Self Care | Payer: 59 | Source: Ambulatory Visit | Attending: Internal Medicine | Admitting: Internal Medicine

## 2014-05-09 ENCOUNTER — Encounter: Payer: Self-pay | Admitting: Internal Medicine

## 2014-05-09 VITALS — BP 130/80 | HR 61 | Temp 98.3°F | Ht 64.8 in | Wt 172.5 lb

## 2014-05-09 DIAGNOSIS — I10 Essential (primary) hypertension: Secondary | ICD-10-CM

## 2014-05-09 DIAGNOSIS — Z124 Encounter for screening for malignant neoplasm of cervix: Secondary | ICD-10-CM

## 2014-05-09 DIAGNOSIS — G8929 Other chronic pain: Secondary | ICD-10-CM

## 2014-05-09 DIAGNOSIS — F3289 Other specified depressive episodes: Secondary | ICD-10-CM

## 2014-05-09 DIAGNOSIS — Z9109 Other allergy status, other than to drugs and biological substances: Secondary | ICD-10-CM

## 2014-05-09 DIAGNOSIS — K8020 Calculus of gallbladder without cholecystitis without obstruction: Secondary | ICD-10-CM

## 2014-05-09 DIAGNOSIS — F329 Major depressive disorder, single episode, unspecified: Secondary | ICD-10-CM

## 2014-05-09 DIAGNOSIS — F419 Anxiety disorder, unspecified: Secondary | ICD-10-CM

## 2014-05-09 DIAGNOSIS — Z01419 Encounter for gynecological examination (general) (routine) without abnormal findings: Secondary | ICD-10-CM | POA: Insufficient documentation

## 2014-05-09 DIAGNOSIS — F411 Generalized anxiety disorder: Secondary | ICD-10-CM

## 2014-05-09 DIAGNOSIS — E7800 Pure hypercholesterolemia, unspecified: Secondary | ICD-10-CM

## 2014-05-09 DIAGNOSIS — Z86010 Personal history of colonic polyps: Secondary | ICD-10-CM

## 2014-05-09 DIAGNOSIS — M549 Dorsalgia, unspecified: Secondary | ICD-10-CM

## 2014-05-09 DIAGNOSIS — F32A Depression, unspecified: Secondary | ICD-10-CM

## 2014-05-09 DIAGNOSIS — Z1151 Encounter for screening for human papillomavirus (HPV): Secondary | ICD-10-CM | POA: Insufficient documentation

## 2014-05-09 MED ORDER — FLUOXETINE HCL 40 MG PO CAPS: 40.0000 mg | ORAL_CAPSULE | Freq: Every day | ORAL | Status: DC

## 2014-05-09 MED ORDER — ALPRAZOLAM 1 MG PO TABS: 1.0000 mg | ORAL_TABLET | Freq: Two times a day (BID) | ORAL | Status: DC | PRN

## 2014-05-09 NOTE — Progress Notes (Signed)
Pre visit review using our clinic review tool, if applicable. No additional management support is needed unless otherwise documented below in the visit note. 

## 2014-05-12 ENCOUNTER — Encounter: Payer: Self-pay | Admitting: Internal Medicine

## 2014-05-12 NOTE — Assessment & Plan Note (Signed)
S/p cholecystectomy.  Symptoms have improved some.  No nausea.  Follow.  Having bowel movement qod.

## 2014-05-12 NOTE — Assessment & Plan Note (Signed)
Blood pressure as outlined.  Follow metabolic panel.  Follow pressure.

## 2014-05-12 NOTE — Assessment & Plan Note (Signed)
Has had colonoscopy.

## 2014-05-12 NOTE — Assessment & Plan Note (Signed)
Increased stress as outlined in previous notes.   On prozac. Will increase the prozac to 40mg  q day.   Xanax as needed.  Follow.

## 2014-05-12 NOTE — Assessment & Plan Note (Signed)
Stable.  Follow.   

## 2014-05-12 NOTE — Assessment & Plan Note (Signed)
Has seen Dr Joya Salm and Dr Arnoldo Morale.  Has known degenerative disc disease and reported spinal stenosis.   Has had injections and PT.   Saw Dr Jenne Campus - [facility].  Was not felt to be a surgical candidate.  Mobic helps.  Discussed the possibility of Mobic causing GI side effects.

## 2014-05-12 NOTE — Assessment & Plan Note (Addendum)
On no medication.  Low cholesterol diet.  Cholesterol checked in 4/15 significantly elevated.  She declines medication.  Discussed with her today.  She declines.  Follow.

## 2014-05-12 NOTE — Assessment & Plan Note (Signed)
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  Increased stress and anxiety.  See previous notes for details.  Husband recently diagnosed with lung cancer.  On Prozac.  Still requires xanax.  Increase prozac to 40mg  q day.  Follow.

## 2014-05-12 NOTE — Progress Notes (Signed)
Subjective:    Patient ID: Angel Riddle, female    DOB: 1956/10/11, 58 y.o.   MRN: [id number]  HPI 58 year old female with past history of depression, anxiety, panic attacks, chronic back pain and hypercholesterolemia.  She comes in today to follow up on these issues as well as for a complete physical exam.  Has chronic back pain and has seen Dr Joya Salm and Dr Arnoldo Morale.  Has had injections and physical therapy.  Saw Dr Jenne Campus for further evaluation.  Felt to be arthritis.  Taking Mobic.  This helps.  Has had increased stress and anxiety.  Was previously on zoloft.  Per her report, dose increased and she did not tolerate. She was tried on cymbalta.  This helped, but could not afford.  See previous notes for details.  Was started on Prozac.  States she feels better.  She has been having to increase the amount of the xanax lately.  We discussed increasing the prozac dose.  She is agreeable to this.  Eating and drinking.  Nausea is better.  Having a bowel movement qod.     Past Medical History  Diagnosis Date  . Arthritis   . Depression   . Allergy   . Heart murmur   . Hypercholesterolemia   . Hx: UTI (urinary tract infection)   . Panic attacks     H/O  . Hx of colonic polyp   . Anxiety   . Chronic back pain     degenerative disc disease  . Gallstones     Outpatient Encounter Prescriptions as of 05/09/2014  Medication Sig  . ALPRAZolam (XANAX) 1 MG tablet Take 1 tablet (1 mg total) by mouth 2 (two) times daily as needed for sleep.  Marland Kitchen aspirin 81 MG tablet Take 81 mg by mouth 2 (two) times a week.   . esomeprazole (NEXIUM) 40 MG capsule Take 1 capsule (40 mg total) by mouth daily.  . hydrochlorothiazide (HYDRODIURIL) 25 MG tablet Take 0.5 tablets (12.5 mg total) by mouth daily.  . meloxicam (MOBIC) 7.5 MG tablet Take 1 tablet (7.5 mg total) by mouth daily.  . promethazine (PHENERGAN) 25 MG tablet Take 1 tablet (25 mg total) by mouth 2 (two) times daily as needed for nausea.  .  Pseudoephedrine HCl 240 MG TB24 Take by mouth as needed.  . [DISCONTINUED] ALPRAZolam (XANAX) 1 MG tablet Take 1 tablet (1 mg total) by mouth 2 (two) times daily as needed for sleep.  . [DISCONTINUED] Fluoxetine HCl, PMDD, 10 MG CAPS Take three capsules q day  . FLUoxetine (PROZAC) 40 MG capsule Take 1 capsule (40 mg total) by mouth daily.  . fluticasone (FLONASE) 50 MCG/ACT nasal spray Place 1 spray into both nostrils.  . hydrocortisone (CORTEF) 10 MG tablet Take 1 tablet by mouth daily.  . traMADol (ULTRAM) 50 MG tablet     Review of Systems Patient denies any headache, lightheadedness or dizziness.  No significant sinus or allergy symptoms currently.  No chest pain, tightness or palpitations.  No increased shortness of breath, cough or congestion.  Breathing stable.  No nausea now.  No significant abdominal pain or cramping.  No bowel change, such as diarrhea, constipation, BRBPR or melana.  No urine change. Persistent back pain as outlined.   Better on Mobic.  Increased stress.  Feels she is handling things relatively well.  Is in agreement to increase the prozac.  States blood pressure has been averaging 120s/60-70s.       Objective:  Physical Exam  Filed Vitals:   05/09/14 1129  BP: 130/80  Pulse: 61  Temp: 98.3 F (63.31 C)   58 year old female in no acute distress.   HEENT:  Nares- clear.  Oropharynx - without lesions. NECK:  Supple.  Nontender.  No audible bruit.  HEART:  Appears to be regular. LUNGS:  No crackles or wheezing audible.  Respirations even and unlabored.  RADIAL PULSE:  Equal bilaterally.    BREASTS:  No nipple discharge or nipple retraction present.  Could not appreciate any distinct nodules or axillary adenopathy.  ABDOMEN:  Soft, nontender.  Bowel sounds present and normal.  No audible abdominal bruit.  GU:  Normal external genitalia.  Vaginal vault without lesions.  Cervix identified.  Pap performed. Could not appreciate any adnexal masses or tenderness.    RECTAL:  Heme negative.   EXTREMITIES:  No increased edema present.  DP pulses palpable and equal bilaterally.          Assessment & Plan:  HEALTH MAINTENANCE.  Physical today.   Mammogram 06/07/13 - Bir[facility] II.  Schedule f/u mammogram when due.    I spent 25 minutes with the patient and more than 50% of the time was spent in consultation regarding the above.

## 2014-05-13 ENCOUNTER — Encounter: Payer: Self-pay | Admitting: *Deleted

## 2014-05-13 LAB — CYTOLOGY - PAP

## 2014-05-31 ENCOUNTER — Other Ambulatory Visit: Payer: Self-pay | Admitting: *Deleted

## 2014-05-31 MED ORDER — HYDROCHLOROTHIAZIDE 25 MG PO TABS: 12.5000 mg | ORAL_TABLET | Freq: Every day | ORAL | Status: DC

## 2014-06-03 ENCOUNTER — Encounter: Payer: Self-pay | Admitting: Internal Medicine

## 2014-06-17 ENCOUNTER — Telehealth: Payer: Self-pay | Admitting: Internal Medicine

## 2014-06-17 NOTE — Telephone Encounter (Signed)
Pt called in and said received a prescription of maloxicam. Starting wearing off and having a dull like pain and was wondering if could increase dosage.

## 2014-06-17 NOTE — Telephone Encounter (Signed)
Please advise 

## 2014-06-18 NOTE — Telephone Encounter (Signed)
She is currently on 7.5mg  of meloxicam.  If having a flare, can temporarily increase to 15mg  q day.  Once controlled, I would like for her to go back to 7.5mg  q day.  (can take two 7.5mg  tablets).  If persistent problems or increasing pain, will need to be reevaluated.

## 2014-06-18 NOTE — Telephone Encounter (Signed)
Left detailed message on mobile voicemail per [facility]

## 2014-07-12 MED ORDER — MELOXICAM 15 MG PO TABS: 15.0000 mg | ORAL_TABLET | Freq: Every day | ORAL | Status: DC

## 2014-07-12 NOTE — Telephone Encounter (Signed)
Ok to refill the meloxicam 15mg  q day #30 with no refills.  She has an appt with me at the end of this month.  We can discuss more then.  Thanks.

## 2014-07-12 NOTE — Telephone Encounter (Signed)
Spoke with pt advised Rx sent 

## 2014-07-12 NOTE — Telephone Encounter (Signed)
Received fax from pharmacy requesting Meloxicam 15mg  one qd.  However in review of chart the 15mg  was to be temporary.  Spoke with pt, she states she continues to have pain and she is still taking 15mg  daily.  She also requests whether Voltaren Gel would be an alternative.  Please advise

## 2014-07-31 ENCOUNTER — Other Ambulatory Visit (INDEPENDENT_AMBULATORY_CARE_PROVIDER_SITE_OTHER): Payer: 59

## 2014-07-31 DIAGNOSIS — E7800 Pure hypercholesterolemia, unspecified: Secondary | ICD-10-CM

## 2014-07-31 DIAGNOSIS — R7989 Other specified abnormal findings of blood chemistry: Secondary | ICD-10-CM

## 2014-07-31 DIAGNOSIS — I10 Essential (primary) hypertension: Secondary | ICD-10-CM

## 2014-07-31 LAB — COMPREHENSIVE METABOLIC PANEL
ALK PHOS: 80 U/L (ref 39–117)
ALT: 14 U/L (ref 0–35)
AST: 24 U/L (ref 0–37)
Albumin: 3.5 g/dL (ref 3.5–5.2)
BUN: 14 mg/dL (ref 6–23)
CO2: 27 meq/L (ref 19–32)
Calcium: 8.9 mg/dL (ref 8.4–10.5)
Chloride: 104 mEq/L (ref 96–112)
Creatinine, Ser: 0.8 mg/dL (ref 0.4–1.2)
GFR: 80.49 mL/min (ref >60.00)
Glucose, Bld: 114 mg/dL — ABNORMAL HIGH (ref 70–99)
Potassium: 4 mEq/L (ref 3.5–5.1)
SODIUM: 140 meq/L (ref 135–145)
TOTAL PROTEIN: 6.2 g/dL (ref 6.0–8.3)
Total Bilirubin: 0.2 mg/dL (ref 0.2–1.2)

## 2014-07-31 LAB — HEPATIC FUNCTION PANEL
ALBUMIN: 3.5 g/dL (ref 3.5–5.2)
ALK PHOS: 80 U/L (ref 39–117)
ALT: 14 U/L (ref 0–35)
AST: 24 U/L (ref 0–37)
Bilirubin, Direct: 0.1 mg/dL (ref 0.0–0.3)
Total Bilirubin: 0.2 mg/dL (ref 0.2–1.2)
Total Protein: 6.2 g/dL (ref 6.0–8.3)

## 2014-07-31 LAB — LIPID PANEL
Cholesterol: 258 mg/dL — ABNORMAL HIGH (ref 0–200)
HDL: 43.7 mg/dL (ref >39.00)
NonHDL: 214.3
Total CHOL/HDL Ratio: 6
Triglycerides: 273 mg/dL — ABNORMAL HIGH (ref 0.0–149.0)
VLDL: 54.6 mg/dL — AB (ref 0.0–40.0)

## 2014-07-31 LAB — LDL CHOLESTEROL, DIRECT: Direct LDL: 175.6 mg/dL

## 2014-08-05 ENCOUNTER — Ambulatory Visit (INDEPENDENT_AMBULATORY_CARE_PROVIDER_SITE_OTHER): Payer: 59 | Admitting: Internal Medicine

## 2014-08-05 ENCOUNTER — Encounter: Payer: Self-pay | Admitting: Internal Medicine

## 2014-08-05 VITALS — BP 116/80 | HR 69 | Temp 98.8°F | Ht 64.8 in | Wt 171.2 lb

## 2014-08-05 DIAGNOSIS — F419 Anxiety disorder, unspecified: Secondary | ICD-10-CM

## 2014-08-05 DIAGNOSIS — F411 Generalized anxiety disorder: Secondary | ICD-10-CM

## 2014-08-05 DIAGNOSIS — F3289 Other specified depressive episodes: Secondary | ICD-10-CM

## 2014-08-05 DIAGNOSIS — I10 Essential (primary) hypertension: Secondary | ICD-10-CM

## 2014-08-05 DIAGNOSIS — G8929 Other chronic pain: Secondary | ICD-10-CM

## 2014-08-05 DIAGNOSIS — E7800 Pure hypercholesterolemia, unspecified: Secondary | ICD-10-CM

## 2014-08-05 DIAGNOSIS — Z23 Encounter for immunization: Secondary | ICD-10-CM

## 2014-08-05 DIAGNOSIS — F329 Major depressive disorder, single episode, unspecified: Secondary | ICD-10-CM

## 2014-08-05 DIAGNOSIS — Z9109 Other allergy status, other than to drugs and biological substances: Secondary | ICD-10-CM

## 2014-08-05 DIAGNOSIS — M549 Dorsalgia, unspecified: Secondary | ICD-10-CM

## 2014-08-05 DIAGNOSIS — F32A Depression, unspecified: Secondary | ICD-10-CM

## 2014-08-05 DIAGNOSIS — Z86010 Personal history of colonic polyps: Secondary | ICD-10-CM

## 2014-08-05 MED ORDER — ALPRAZOLAM 1 MG PO TABS: 1.0000 mg | ORAL_TABLET | Freq: Two times a day (BID) | ORAL | Status: DC | PRN

## 2014-08-05 NOTE — Progress Notes (Signed)
Subjective:    Patient ID: Angel Riddle, female    DOB: 21-May-1956, 58 y.o.   MRN: [id number]  HPI 58 year old female with past history of depression, anxiety, panic attacks, chronic back pain and hypercholesterolemia.  She comes in today for a scheduled follow up.   Has chronic back pain and has seen Dr Joya Salm and Dr Arnoldo Morale.  Has had injections and physical therapy.  Saw Dr Jenne Campus for further evaluation.  Felt to be arthritis.  Taking Mobic.  Is not taking 15mg  per day.  This helps.  She feels she needs to stay on this dose for now.   Has had increased stress and anxiety.  Was previously on zoloft.  Per her report, dose increased and she did not tolerate. She was tried on cymbalta.  This helped, but could not afford.  See previous notes for details.  Was started on Prozac.  States she feels better.  Increased stress with her husband's medical issues.  Her son is living with her as well.  Was questioning increasing the dose of prozac.  Only taking one xanax per day.  Feels better if takes two.  Eating and drinking.  Some drainage.  No significant cough or chest congestion.  No sob.  S/p removal of left shoulder lesion.  States checked out fine.  Recent root canal.  Some acid reflux.  Not on any medication.  May be affecting the drainage and need for clearing of her throat.     Past Medical History  Diagnosis Date  . Arthritis   . Depression   . Allergy   . Heart murmur   . Hypercholesterolemia   . Hx: UTI (urinary tract infection)   . Panic attacks     H/O  . Hx of colonic polyp   . Anxiety   . Chronic back pain     degenerative disc disease  . Gallstones     Outpatient Encounter Prescriptions as of 08/05/2014  Medication Sig  . ALPRAZolam (XANAX) 1 MG tablet Take 1 tablet (1 mg total) by mouth 2 (two) times daily as needed for sleep.  Marland Kitchen aspirin 81 MG tablet Take 81 mg by mouth 2 (two) times a week.   . esomeprazole (NEXIUM) 40 MG capsule Take 1 capsule (40 mg total) by mouth  daily.  Marland Kitchen FLUoxetine (PROZAC) 40 MG capsule Take 1 capsule (40 mg total) by mouth daily.  . fluticasone (FLONASE) 50 MCG/ACT nasal spray Place 1 spray into both nostrils.  . hydrochlorothiazide (HYDRODIURIL) 25 MG tablet Take 0.5 tablets (12.5 mg total) by mouth daily.  . hydrocortisone (CORTEF) 10 MG tablet Take 1 tablet by mouth daily.  . meloxicam (MOBIC) 15 MG tablet Take 1 tablet (15 mg total) by mouth daily.  . promethazine (PHENERGAN) 25 MG tablet Take 1 tablet (25 mg total) by mouth 2 (two) times daily as needed for nausea.  . Pseudoephedrine HCl 240 MG TB24 Take by mouth as needed.  . traMADol (ULTRAM) 50 MG tablet     Review of Systems Patient denies any headache, lightheadedness or dizziness.  Some drainage as outlined.   No chest pain, tightness or palpitations.  No increased shortness of breath, cough or congestion.  Breathing stable.  No nausea.  No abdominal pain or cramping.  No bowel change, such as diarrhea, constipation, BRBPR or melana.  No urine change. Persistent back pain as outlined.   Better on Mobic.  Increased stress.  Feels she is handling things relatively well.  See  above.  Feels she needs a little something more.       Objective:   Physical Exam  Filed Vitals:   08/05/14 1408  BP: 116/80  Pulse: 69  Temp: 98.8 F (54.57 C)   58 year old female in no acute distress.   HEENT:  Nares- clear.  Oropharynx - without lesions. NECK:  Supple.  Nontender.  No audible bruit.  HEART:  Appears to be regular. LUNGS:  No crackles or wheezing audible.  Respirations even and unlabored.  RADIAL PULSE:  Equal bilaterally.  ABDOMEN:  Soft, nontender.  Bowel sounds present and normal.  No audible abdominal bruit.   EXTREMITIES:  No increased edema present.  DP pulses palpable and equal bilaterally.          Assessment & Plan:  HEALTH MAINTENANCE.  Physical 05/12/14.   Mammogram 06/07/13 - Birads II.  Schedule f/u mammogram.  I spent 25 minutes with the patient and more than  50% of the time was spent in consultation regarding the above.

## 2014-08-05 NOTE — Progress Notes (Signed)
Pre visit review using our clinic review tool, if applicable. No additional management support is needed unless otherwise documented below in the visit note. 

## 2014-08-07 ENCOUNTER — Encounter: Payer: Self-pay | Admitting: Internal Medicine

## 2014-08-07 NOTE — Assessment & Plan Note (Signed)
Some increased drainage and clearing of her throat.  Treat reflux with zantac as directed. Nasacort as directed.

## 2014-08-07 NOTE — Assessment & Plan Note (Signed)
On no medication.  Low cholesterol diet.  Cholesterol just checked and improved, but still elevated.  She declines medication.  Discussed with her today.  She declines.  Follow.

## 2014-08-07 NOTE — Assessment & Plan Note (Signed)
Has had colonoscopy.

## 2014-08-07 NOTE — Assessment & Plan Note (Addendum)
On prozac.  Adjust xanax as outlined.  Follow.

## 2014-08-07 NOTE — Assessment & Plan Note (Signed)
Blood pressure ok now.  Follow metabolic panel.  Follow.

## 2014-08-07 NOTE — Assessment & Plan Note (Signed)
Increased stress as outlined.  Feels she needs a little something more.  On prozac 40mg  q day now.  On xanax once a day.  Occasionally will take two per day.  Will increase to one bid for now.  Plan to go back to one per day if the future.  Follow closely.

## 2014-08-07 NOTE — Assessment & Plan Note (Signed)
Has seen Dr Joya Salm and Dr Arnoldo Morale.  Has known degenerative disc disease and reported spinal stenosis.   Has had injections and PT.   Saw Dr Jenne Campus - [facility].  Was not felt to be a surgical candidate.  Mobic helps.  Discussed the possibility of Mobic causing GI side effects.  Add zantac.

## 2014-09-16 ENCOUNTER — Other Ambulatory Visit: Payer: Self-pay | Admitting: *Deleted

## 2014-09-16 MED ORDER — FLUOXETINE HCL 40 MG PO CAPS: 40.0000 mg | ORAL_CAPSULE | Freq: Every day | ORAL | Status: DC

## 2014-09-16 NOTE — Telephone Encounter (Signed)
Ok refill? 

## 2014-09-17 MED ORDER — MELOXICAM 15 MG PO TABS: 15.0000 mg | ORAL_TABLET | Freq: Every day | ORAL | Status: DC

## 2014-09-17 NOTE — Telephone Encounter (Signed)
rx sent in for mobic 15mg  #30 with one refill.

## 2014-10-07 ENCOUNTER — Encounter: Payer: Self-pay | Admitting: Internal Medicine

## 2014-10-08 ENCOUNTER — Ambulatory Visit (INDEPENDENT_AMBULATORY_CARE_PROVIDER_SITE_OTHER): Payer: 59 | Admitting: Internal Medicine

## 2014-10-08 ENCOUNTER — Encounter: Payer: Self-pay | Admitting: Internal Medicine

## 2014-10-08 VITALS — BP 120/82 | HR 79 | Temp 98.5°F | Ht 64.8 in | Wt 182.8 lb

## 2014-10-08 DIAGNOSIS — F32A Depression, unspecified: Secondary | ICD-10-CM

## 2014-10-08 DIAGNOSIS — E7800 Pure hypercholesterolemia, unspecified: Secondary | ICD-10-CM

## 2014-10-08 DIAGNOSIS — Z79899 Other long term (current) drug therapy: Secondary | ICD-10-CM

## 2014-10-08 DIAGNOSIS — F329 Major depressive disorder, single episode, unspecified: Secondary | ICD-10-CM

## 2014-10-08 DIAGNOSIS — Z91048 Other nonmedicinal substance allergy status: Secondary | ICD-10-CM

## 2014-10-08 DIAGNOSIS — G8929 Other chronic pain: Secondary | ICD-10-CM

## 2014-10-08 DIAGNOSIS — Z860100 Personal history of colon polyps, unspecified: Secondary | ICD-10-CM

## 2014-10-08 DIAGNOSIS — Z1239 Encounter for other screening for malignant neoplasm of breast: Secondary | ICD-10-CM

## 2014-10-08 DIAGNOSIS — I10 Essential (primary) hypertension: Secondary | ICD-10-CM

## 2014-10-08 DIAGNOSIS — Z9109 Other allergy status, other than to drugs and biological substances: Secondary | ICD-10-CM

## 2014-10-08 DIAGNOSIS — M549 Dorsalgia, unspecified: Secondary | ICD-10-CM

## 2014-10-08 MED ORDER — AMOXICILLIN-POT CLAVULANATE 875-125 MG PO TABS: 1.0000 | ORAL_TABLET | Freq: Two times a day (BID) | ORAL | Status: DC

## 2014-10-08 MED ORDER — ALPRAZOLAM 1 MG PO TABS: 1.0000 mg | ORAL_TABLET | Freq: Two times a day (BID) | ORAL | Status: DC | PRN

## 2014-10-09 ENCOUNTER — Ambulatory Visit: Payer: Self-pay | Admitting: Internal Medicine

## 2014-10-09 LAB — HM MAMMOGRAPHY: HM Mammogram: NEGATIVE

## 2014-10-10 ENCOUNTER — Encounter: Payer: Self-pay | Admitting: Internal Medicine

## 2014-10-13 NOTE — Progress Notes (Signed)
Subjective:    Patient ID: Angel Riddle, female    DOB: 1956-09-03, 58 y.o.   MRN: [id number]  HPI 58 year old female with past history of depression, anxiety, panic attacks, chronic back pain and hypercholesterolemia.  She comes in today for a scheduled follow up.   Has chronic back pain and has seen Dr Joya Salm and Dr Arnoldo Morale.  Has had injections and physical therapy.  Saw Dr Jenne Campus for further evaluation.  Felt to be arthritis.  Taking Mobic. Has had increased stress and anxiety.  Was previously on zoloft.  Per her report, dose increased and she did not tolerate. She was tried on cymbalta.  This helped, but could not afford.  See previous notes for details.  Was started on Prozac.  States she feels better.  Increased stress with her husband's medical issues.  Feels she is doing better.  Takes xanax q day to bid prn.  Eating and drinking.  No significant cough or chest congestion.  No sob.  On zantac.      Past Medical History  Diagnosis Date  . Arthritis   . Depression   . Allergy   . Heart murmur   . Hypercholesterolemia   . Hx: UTI (urinary tract infection)   . Panic attacks     H/O  . Hx of colonic polyp   . Anxiety   . Chronic back pain     degenerative disc disease  . Gallstones     Outpatient Encounter Prescriptions as of 10/08/2014  Medication Sig  . ALPRAZolam (XANAX) 1 MG tablet Take 1 tablet (1 mg total) by mouth 2 (two) times daily as needed for sleep.  Marland Kitchen esomeprazole (NEXIUM) 40 MG capsule Take 1 capsule (40 mg total) by mouth daily.  Marland Kitchen FLUoxetine (PROZAC) 40 MG capsule Take 1 capsule (40 mg total) by mouth daily.  . fluticasone (FLONASE) 50 MCG/ACT nasal spray Place 1 spray into both nostrils.  . hydrochlorothiazide (HYDRODIURIL) 25 MG tablet Take 0.5 tablets (12.5 mg total) by mouth daily.  Marland Kitchen HYDROcodone-acetaminophen (NORCO/VICODIN) 5-325 MG per tablet Take 1 tablet by mouth every 6 (six) hours as needed for moderate pain.  . hydrocortisone (CORTEF) 10 MG  tablet Take 1 tablet by mouth daily.  . meloxicam (MOBIC) 15 MG tablet Take 1 tablet (15 mg total) by mouth daily.  . promethazine (PHENERGAN) 25 MG tablet Take 1 tablet (25 mg total) by mouth 2 (two) times daily as needed for nausea.  . Pseudoephedrine HCl 240 MG TB24 Take by mouth as needed.  . traMADol (ULTRAM) 50 MG tablet   . [DISCONTINUED] ALPRAZolam (XANAX) 1 MG tablet Take 1 tablet (1 mg total) by mouth 2 (two) times daily as needed for sleep.  . [DISCONTINUED] aspirin 81 MG tablet Take 81 mg by mouth 2 (two) times a week.   Marland Kitchen amoxicillin-clavulanate (AUGMENTIN) 875-125 MG per tablet Take 1 tablet by mouth 2 (two) times daily.    Review of Systems Patient denies any headache, lightheadedness or dizziness.  No chest pain, tightness or palpitations.  No increased shortness of breath, cough or congestion.  Breathing stable.  No nausea.  No abdominal pain or cramping.  No bowel change, such as diarrhea, constipation, BRBPR or melana.  No urine change. Persistent back pain as outlined.   Better on Mobic.  Increased stress.  Feels she is handling things relatively well.  See above.  Doing better.       Objective:   Physical Exam  Filed Vitals:  10/08/14 1434  BP: 120/82  Pulse: 79  Temp: 98.5 F (36.9 C)   Blood pressure recheck:  54/53  58 year old female in no acute distress.   HEENT:  Nares- clear.  Oropharynx - without lesions. NECK:  Supple.  Nontender.  No audible bruit.  HEART:  Appears to be regular. LUNGS:  No crackles or wheezing audible.  Respirations even and unlabored.  RADIAL PULSE:  Equal bilaterally.  ABDOMEN:  Soft, nontender.  Bowel sounds present and normal.  No audible abdominal bruit.   EXTREMITIES:  No increased edema present.  DP pulses palpable and equal bilaterally.          Assessment & Plan:  1. Breast cancer screening overdue - MM DIGITAL SCREENING BILATERAL; Future  2. Essential hypertension Blood pressure controlled.    3. Chronic back  pain W/up as outlined.  Stable.    4. Depression Doing better.  On prozac.  Has xanax if needed.   5. Environmental allergies Controlled.   6. Hypercholesterolemia Low cholesterol diet and exercise.  Follow lipid panel.  She desires not to take cholesterol medication.    7. History of colonic polyps Has had colonoscopy.   HEALTH MAINTENANCE.  Physical 05/12/14.   Mammogram 06/07/13 - Birads II.  Schedule f/u mammogram.  I spent 25 minutes with the patient and more than 50% of the time was spent in consultation regarding the above.

## 2014-10-30 ENCOUNTER — Encounter: Payer: Self-pay | Admitting: Internal Medicine

## 2014-11-14 ENCOUNTER — Other Ambulatory Visit: Payer: Self-pay | Admitting: *Deleted

## 2014-11-14 MED ORDER — MELOXICAM 15 MG PO TABS: 15.0000 mg | ORAL_TABLET | Freq: Every day | ORAL | Status: DC

## 2014-11-14 MED ORDER — HYDROCHLOROTHIAZIDE 25 MG PO TABS: 12.5000 mg | ORAL_TABLET | Freq: Every day | ORAL | Status: DC

## 2014-11-19 ENCOUNTER — Telehealth: Payer: Self-pay | Admitting: *Deleted

## 2014-11-19 NOTE — Telephone Encounter (Signed)
Spoke with pt, advised Dr Nicki Reaper will see her at 1:15pm on 12.16.15.

## 2014-11-20 ENCOUNTER — Ambulatory Visit (INDEPENDENT_AMBULATORY_CARE_PROVIDER_SITE_OTHER): Payer: 59 | Admitting: Internal Medicine

## 2014-11-20 ENCOUNTER — Encounter: Payer: Self-pay | Admitting: Internal Medicine

## 2014-11-20 ENCOUNTER — Encounter (INDEPENDENT_AMBULATORY_CARE_PROVIDER_SITE_OTHER): Payer: Self-pay

## 2014-11-20 VITALS — BP 130/84 | HR 69 | Temp 98.8°F | Ht 64.8 in

## 2014-11-20 DIAGNOSIS — K6289 Other specified diseases of anus and rectum: Secondary | ICD-10-CM

## 2014-11-20 NOTE — Progress Notes (Signed)
Pre visit review using our clinic review tool, if applicable. No additional management support is needed unless otherwise documented below in the visit note. 

## 2014-11-24 ENCOUNTER — Encounter: Payer: Self-pay | Admitting: Internal Medicine

## 2014-11-24 DIAGNOSIS — K6289 Other specified diseases of anus and rectum: Secondary | ICD-10-CM | POA: Insufficient documentation

## 2014-11-24 NOTE — Progress Notes (Signed)
Subjective:    Patient ID: Angel Riddle, female    DOB: 05/17/1956, 58 y.o.   MRN: [id number]  HPI 58 year old female with past history of depression, anxiety, panic attacks, chronic back pain and hypercholesterolemia.  She comes in today as a work in with concerns regarding persistent rectal pain, itching and irritation.   States symptoms started two weeks ago.  Initially described itching.  Has used preparation H cream, sitz baths and some otc suppositories.  Has been having loose bowels.  Describes a burning sensation and pain - up in the rectum.  Also noticed a lesion above the rectum.  She has been applying some vaseline gel to this area.  No blood in the stool.  Has noticed streaks of blood on the tissue when she wipes.  No abdominal pain.        Past Medical History  Diagnosis Date  . Arthritis   . Depression   . Allergy   . Heart murmur   . Hypercholesterolemia   . Hx: UTI (urinary tract infection)   . Panic attacks     H/O  . Hx of colonic polyp   . Anxiety   . Chronic back pain     degenerative disc disease  . Gallstones     Outpatient Encounter Prescriptions as of 11/20/2014  Medication Sig  . ALPRAZolam (XANAX) 1 MG tablet Take 1 tablet (1 mg total) by mouth 2 (two) times daily as needed for sleep.  Marland Kitchen esomeprazole (NEXIUM) 40 MG capsule Take 1 capsule (40 mg total) by mouth daily.  Marland Kitchen FLUoxetine (PROZAC) 40 MG capsule Take 1 capsule (40 mg total) by mouth daily.  . fluticasone (FLONASE) 50 MCG/ACT nasal spray Place 1 spray into both nostrils.  . hydrochlorothiazide (HYDRODIURIL) 25 MG tablet Take 0.5 tablets (12.5 mg total) by mouth daily.  . hydrocortisone (CORTEF) 10 MG tablet Take 1 tablet by mouth daily.  . meloxicam (MOBIC) 15 MG tablet Take 1 tablet (15 mg total) by mouth daily.  . promethazine (PHENERGAN) 25 MG tablet Take 1 tablet (25 mg total) by mouth 2 (two) times daily as needed for nausea.  . Pseudoephedrine HCl 240 MG TB24 Take by mouth as needed.  Marland Kitchen  HYDROcodone-acetaminophen (NORCO/VICODIN) 5-325 MG per tablet Take 1 tablet by mouth every 6 (six) hours as needed for moderate pain.  . traMADol (ULTRAM) 50 MG tablet   . [DISCONTINUED] amoxicillin-clavulanate (AUGMENTIN) 875-125 MG per tablet Take 1 tablet by mouth 2 (two) times daily.    Review of Systems describes the rectal irritation and burning as outlined.  Some previous itching.  Some loose stool.  No abdominal pain.  No back pain.  Streaks of blood on the tissue.  Has used preparation H cream and otc suppositories.  Also sitz baths.       Objective:   Physical Exam  Filed Vitals:   11/20/14 1320  BP: 130/84  Pulse: 69  Temp: 98.8 F (77.82 C)   58 year old female in no acute distress.  HEART:  Appears to be regular. LUNGS:  No crackles or wheezing audible.  Respirations even and unlabored. ABDOMEN:  Soft, nontender.  Bowel sounds present and normal.  No audible abdominal bruit.   RECTAL:  No external hemorrhoid.  One circular 1x24mm lesion just superior to the rectum.  Rectal exam - heme negative.  Question fissure.  No mass.           Assessment & Plan:  1. Rectal irritation Symptoms  and exam as outlined.  No blood in the stool.  Will treat with annusol HC suppositories.  rx given to pt.  Follow. Notify me if persistent problems.    HEALTH MAINTENANCE.  Physical 05/12/14.   Mammogram 10/09/14 - Birads I.

## 2014-12-12 ENCOUNTER — Telehealth: Payer: Self-pay | Admitting: *Deleted

## 2014-12-12 MED ORDER — ALPRAZOLAM 1 MG PO TABS: 1.0000 mg | ORAL_TABLET | Freq: Two times a day (BID) | ORAL | Status: DC | PRN

## 2014-12-12 NOTE — Telephone Encounter (Signed)
Rx faxed

## 2014-12-12 NOTE — Telephone Encounter (Signed)
Refill from pharmacy requesting Alprazolam 1 mg.  Last refill 11.3.15.  Please advise refill

## 2014-12-12 NOTE — Telephone Encounter (Signed)
Refill alprazolam #60 with no refills.

## 2015-01-07 ENCOUNTER — Other Ambulatory Visit (INDEPENDENT_AMBULATORY_CARE_PROVIDER_SITE_OTHER): Payer: Commercial Managed Care - PPO

## 2015-01-07 DIAGNOSIS — I10 Essential (primary) hypertension: Secondary | ICD-10-CM

## 2015-01-07 DIAGNOSIS — E7800 Pure hypercholesterolemia, unspecified: Secondary | ICD-10-CM

## 2015-01-07 DIAGNOSIS — Z79899 Other long term (current) drug therapy: Secondary | ICD-10-CM

## 2015-01-07 LAB — HEPATIC FUNCTION PANEL
ALK PHOS: 102 U/L (ref 39–117)
ALT: 9 U/L (ref 0–35)
AST: 18 U/L (ref 0–37)
Albumin: 4.2 g/dL (ref 3.5–5.2)
BILIRUBIN DIRECT: 0.1 mg/dL (ref 0.0–0.3)
TOTAL PROTEIN: 6.6 g/dL (ref 6.0–8.3)
Total Bilirubin: 0.4 mg/dL (ref 0.2–1.2)

## 2015-01-07 LAB — BASIC METABOLIC PANEL
BUN: 19 mg/dL (ref 6–23)
CO2: 29 meq/L (ref 19–32)
Calcium: 9.3 mg/dL (ref 8.4–10.5)
Chloride: 105 mEq/L (ref 96–112)
Creatinine, Ser: 0.89 mg/dL (ref 0.40–1.20)
GFR: 69.02 mL/min (ref >60.00)
Glucose, Bld: 90 mg/dL (ref 70–99)
Potassium: 4.2 mEq/L (ref 3.5–5.1)
SODIUM: 140 meq/L (ref 135–145)

## 2015-01-07 LAB — LIPID PANEL
CHOL/HDL RATIO: 7
Cholesterol: 319 mg/dL — ABNORMAL HIGH (ref 0–200)
HDL: 48.3 mg/dL (ref >39.00)

## 2015-01-07 LAB — LDL CHOLESTEROL, DIRECT: Direct LDL: 190 mg/dL

## 2015-01-09 ENCOUNTER — Ambulatory Visit (INDEPENDENT_AMBULATORY_CARE_PROVIDER_SITE_OTHER): Payer: Commercial Managed Care - PPO | Admitting: Internal Medicine

## 2015-01-09 ENCOUNTER — Encounter: Payer: Self-pay | Admitting: Internal Medicine

## 2015-01-09 VITALS — BP 112/80 | HR 98 | Temp 99.1°F | Ht 64.8 in

## 2015-01-09 DIAGNOSIS — Z0000 Encounter for general adult medical examination without abnormal findings: Secondary | ICD-10-CM

## 2015-01-09 DIAGNOSIS — F32A Depression, unspecified: Secondary | ICD-10-CM

## 2015-01-09 DIAGNOSIS — F329 Major depressive disorder, single episode, unspecified: Secondary | ICD-10-CM

## 2015-01-09 DIAGNOSIS — Z86010 Personal history of colonic polyps: Secondary | ICD-10-CM

## 2015-01-09 DIAGNOSIS — E7800 Pure hypercholesterolemia, unspecified: Secondary | ICD-10-CM

## 2015-01-09 DIAGNOSIS — G8929 Other chronic pain: Secondary | ICD-10-CM

## 2015-01-09 DIAGNOSIS — I10 Essential (primary) hypertension: Secondary | ICD-10-CM

## 2015-01-09 DIAGNOSIS — F419 Anxiety disorder, unspecified: Secondary | ICD-10-CM

## 2015-01-09 DIAGNOSIS — M549 Dorsalgia, unspecified: Secondary | ICD-10-CM

## 2015-01-09 MED ORDER — PRAVASTATIN SODIUM 10 MG PO TABS: 10.0000 mg | ORAL_TABLET | Freq: Every day | ORAL | Status: DC

## 2015-01-09 NOTE — Progress Notes (Signed)
Pre visit review using our clinic review tool, if applicable. No additional management support is needed unless otherwise documented below in the visit note. 

## 2015-01-12 ENCOUNTER — Encounter: Payer: Self-pay | Admitting: Internal Medicine

## 2015-01-12 DIAGNOSIS — Z0000 Encounter for general adult medical examination without abnormal findings: Secondary | ICD-10-CM | POA: Insufficient documentation

## 2015-01-12 NOTE — Progress Notes (Signed)
Patient ID: Angel Riddle, female   DOB: [birth year], 59 y.o.   MRN: [id number]   Subjective:    Patient ID: Angel Riddle, female    DOB: 1956/01/13, 59 y.o.   MRN: [id number]  HPI  Patient here for a scheduled follow up.  Has a history of hypertension, hypercholesterolemia and chronic back pain.  Increased stress as well.  Feels she is handling stress relatively well.  Takes xanax.  On prozac as well.  Has been having worsening back pain.  Taking meloxicam.  Not felt to be a surgical candidate.  Saw Dr Harl Bowie.  Discussed referral to Dr Sharlet Salina.  Breathing stable.  No cardiac symptoms with increased activity or exertion.  Discussed treatment for cholesterol.     Past Medical History  Diagnosis Date  . Arthritis   . Depression   . Allergy   . Heart murmur   . Hypercholesterolemia   . Hx: UTI (urinary tract infection)   . Panic attacks     H/O  . Hx of colonic polyp   . Anxiety   . Chronic back pain     degenerative disc disease  . Gallstones     Current Outpatient Prescriptions on File Prior to Visit  Medication Sig Dispense Refill  . ALPRAZolam (XANAX) 1 MG tablet Take 1 tablet (1 mg total) by mouth 2 (two) times daily as needed for sleep. 60 tablet 0  . esomeprazole (NEXIUM) 40 MG capsule Take 1 capsule (40 mg total) by mouth daily. 30 capsule 2  . FLUoxetine (PROZAC) 40 MG capsule Take 1 capsule (40 mg total) by mouth daily. 30 capsule 2  . fluticasone (FLONASE) 50 MCG/ACT nasal spray Place 1 spray into both nostrils.    . hydrochlorothiazide (HYDRODIURIL) 25 MG tablet Take 0.5 tablets (12.5 mg total) by mouth daily. 15 tablet 5  . HYDROcodone-acetaminophen (NORCO/VICODIN) 5-325 MG per tablet Take 1 tablet by mouth every 6 (six) hours as needed for moderate pain.    . hydrocortisone (CORTEF) 10 MG tablet Take 1 tablet by mouth daily.    . meloxicam (MOBIC) 15 MG tablet Take 1 tablet (15 mg total) by mouth daily. 30 tablet 2  . promethazine (PHENERGAN) 25 MG tablet Take 1  tablet (25 mg total) by mouth 2 (two) times daily as needed for nausea. 30 tablet 0  . Pseudoephedrine HCl 240 MG TB24 Take by mouth as needed.    . traMADol (ULTRAM) 50 MG tablet      No current facility-administered medications on file prior to visit.    Review of Systems  Constitutional: Negative for fatigue and unexpected weight change.  HENT: Negative for congestion and sinus pressure.   Respiratory: Negative for cough, chest tightness and shortness of breath.   Cardiovascular: Negative for chest pain, palpitations and leg swelling.  Gastrointestinal: Negative for nausea, vomiting, abdominal pain, diarrhea and constipation.  Musculoskeletal: Positive for back pain (worsened recently.  taking meloxicam.  discussed tylenol.  ). Negative for joint swelling.  Skin: Negative for color change and rash.  Neurological: Negative for dizziness, light-headedness and headaches.  Psychiatric/Behavioral:       Increased stress.  Feels she is handling things relatively well.         Objective:    Physical Exam  Constitutional: She appears well-developed and well-nourished.  HENT:  Nose: Nose normal.  Mouth/Throat: Oropharynx is clear and moist.  Neck: Neck supple. No thyromegaly present.  Cardiovascular: Normal rate and regular rhythm.   Pulmonary/Chest: Breath sounds  normal. No respiratory distress. She has no wheezes.  Abdominal: Soft. Bowel sounds are normal. There is no tenderness.  Musculoskeletal: She exhibits no edema or tenderness.  Lymphadenopathy:    She has no cervical adenopathy.  Skin: Skin is warm. No rash noted.  Psychiatric: She has a normal mood and affect. Her behavior is normal.    BP 112/80 mmHg  Pulse 98  Temp(Src) 99.1 F (37.3 C) (Oral)  Ht 5' 4.8" (1.646 m)  Wt   SpO2 97% Wt Readings from Last 3 Encounters:  10/08/14 182 lb 12 oz (82.895 kg)  08/05/14 171 lb 4 oz (77.678 kg)  05/09/14 172 lb 8 oz (78.245 kg)     Lab Results  Component Value Date    WBC 5.8 03/14/2014   HGB 13.7 03/14/2014   HCT 39.8 03/14/2014   PLT 255.0 03/14/2014   GLUCOSE 90 01/07/2015   CHOL 319* 01/07/2015   TRIG * 01/07/2015    402.0 Triglyceride is over 400; calculations on Lipids are invalid.   HDL 48.30 01/07/2015   LDLDIRECT 190.0 01/07/2015   LDLCALC 218* 03/14/2014   ALT 9 01/07/2015   AST 18 01/07/2015   NA 140 01/07/2015   K 4.2 01/07/2015   CL 105 01/07/2015   CREATININE 0.89 01/07/2015   BUN 19 01/07/2015   CO2 29 01/07/2015   TSH 1.28 03/14/2014       Assessment & Plan:   Problem List Items Addressed This Visit    Anxiety    On prozac.  Takes xanax.  Follow.  Appears to be doing well.  Follow.        Chronic back pain - Primary    Has seen Dr Joya Salm and Dr Arnoldo Morale.  Has known degenerative disc disease and reported spinal stenosis.  Had had injections and physical therapy previously.  Saw Dr Erling Conte.  Was not felt to be a surgical candidate.  On meloxicam. Worsening pain now.  Refer to Dr Sharlet Salina given worsening pain.        Relevant Orders   Ambulatory referral to Orthopedic Surgery   Depression    On prozac.  Appears to be doing well.  Follow.       Health care maintenance    Schedule physical next visit.        History of colonic polyps    Has had colonoscopy.  Bowels stable.        Hypercholesterolemia    Low cholesterol diet and exercise.  Discussed treatment.  Start pravastatin 10mg  q day.  Check liver panel in 6 weeks.        Relevant Medications   pravastatin (PRAVACHOL) tablet   Other Relevant Orders   Hepatic function panel   Hypertension    Blood pressure doing well.  Same medication regimen.  Follow metabolic panel.        Relevant Medications   pravastatin (PRAVACHOL) tablet     I spent 25 minutes with the patient and more than 50% of the time was spent in consultation regarding the above.     Einar Pheasant, MD

## 2015-01-12 NOTE — Assessment & Plan Note (Signed)
Has had colonoscopy.  Bowels stable.

## 2015-01-12 NOTE — Assessment & Plan Note (Signed)
On prozac.  Takes xanax.  Follow.  Appears to be doing well.  Follow.

## 2015-01-12 NOTE — Assessment & Plan Note (Signed)
Schedule physical next visit.

## 2015-01-12 NOTE — Assessment & Plan Note (Signed)
Blood pressure doing well.  Same medication regimen.  Follow metabolic panel.   

## 2015-01-12 NOTE — Assessment & Plan Note (Signed)
On prozac.  Appears to be doing well.  Follow.

## 2015-01-12 NOTE — Assessment & Plan Note (Signed)
Has seen Dr Joya Salm and Dr Arnoldo Morale.  Has known degenerative disc disease and reported spinal stenosis.  Had had injections and physical therapy previously.  Saw Dr Erling Conte.  Was not felt to be a surgical candidate.  On meloxicam. Worsening pain now.  Refer to Dr Sharlet Salina given worsening pain.

## 2015-01-12 NOTE — Assessment & Plan Note (Signed)
Low cholesterol diet and exercise.  Discussed treatment.  Start pravastatin 10mg  q day.  Check liver panel in 6 weeks.

## 2015-01-13 ENCOUNTER — Other Ambulatory Visit: Payer: Self-pay | Admitting: *Deleted

## 2015-01-13 MED ORDER — FLUOXETINE HCL 40 MG PO CAPS: 40.0000 mg | ORAL_CAPSULE | Freq: Every day | ORAL | Status: DC

## 2015-01-31 ENCOUNTER — Telehealth: Payer: Self-pay | Admitting: Internal Medicine

## 2015-01-31 ENCOUNTER — Encounter: Payer: Self-pay | Admitting: Internal Medicine

## 2015-01-31 NOTE — Telephone Encounter (Signed)
No symptoms right now, she is feeling a little better because she took a xanax, she wanted you to know what was going on.

## 2015-01-31 NOTE — Telephone Encounter (Signed)
Is she having any symptoms now.  Any other symptoms with the blurring of her vision.  With acute vision change, needs evaluation.  (ER for acute vision change to confirm no TIA, etc).

## 2015-01-31 NOTE — Telephone Encounter (Signed)
If she had acute blurred vision, she needs to be seen.  At least acute care.

## 2015-01-31 NOTE — Telephone Encounter (Signed)
Patient stated that she had weird experience today she was reading all of a sudden the words on the page got really blurred. Please advise

## 2015-01-31 NOTE — Telephone Encounter (Signed)
Spoke with pt, advised of MDs message.  She said she would go to Urgent Care.

## 2015-02-05 ENCOUNTER — Other Ambulatory Visit: Payer: Self-pay | Admitting: *Deleted

## 2015-02-05 MED ORDER — ALPRAZOLAM 1 MG PO TABS: 1.0000 mg | ORAL_TABLET | Freq: Two times a day (BID) | ORAL | Status: DC | PRN

## 2015-02-05 NOTE — Telephone Encounter (Signed)
ok'd refill alprazolam #60 with no refills.

## 2015-02-05 NOTE — Telephone Encounter (Signed)
Okay to refill? Pt has 3 pills left.

## 2015-02-18 ENCOUNTER — Other Ambulatory Visit (INDEPENDENT_AMBULATORY_CARE_PROVIDER_SITE_OTHER): Payer: Commercial Managed Care - PPO

## 2015-02-18 ENCOUNTER — Telehealth: Payer: Self-pay | Admitting: Internal Medicine

## 2015-02-18 DIAGNOSIS — E7800 Pure hypercholesterolemia, unspecified: Secondary | ICD-10-CM

## 2015-02-18 DIAGNOSIS — E669 Obesity, unspecified: Secondary | ICD-10-CM

## 2015-02-18 LAB — HEPATIC FUNCTION PANEL
ALBUMIN: 4.4 g/dL (ref 3.5–5.2)
ALK PHOS: 98 U/L (ref 39–117)
ALT: 9 U/L (ref 0–35)
AST: 18 U/L (ref 0–37)
Bilirubin, Direct: 0.1 mg/dL (ref 0.0–0.3)
TOTAL PROTEIN: 6.9 g/dL (ref 6.0–8.3)
Total Bilirubin: 0.5 mg/dL (ref 0.2–1.2)

## 2015-02-18 NOTE — Telephone Encounter (Signed)
Dr. Erline Levine if the surgeon she would like to see. And she does want to proceed with surgery.

## 2015-02-18 NOTE — Telephone Encounter (Signed)
She does not need a referral for the seminar.  Will need a referral if she decides to proceed with the surgery.  Can provide me then with name of surgeon she wants to use.

## 2015-02-18 NOTE — Telephone Encounter (Signed)
Order placed for referral to surgery for evaluation for gastric surgery.

## 2015-02-18 NOTE — Telephone Encounter (Signed)
Pt wanted to find out if she needed a referral to have gastric sleeve surgery. Pt is going to a seminar for weight loss surgery at the end of the month. please advise pt/msn

## 2015-02-19 ENCOUNTER — Encounter: Payer: Self-pay | Admitting: *Deleted

## 2015-02-19 ENCOUNTER — Other Ambulatory Visit: Payer: Self-pay | Admitting: Internal Medicine

## 2015-02-19 DIAGNOSIS — F329 Major depressive disorder, single episode, unspecified: Secondary | ICD-10-CM

## 2015-02-19 DIAGNOSIS — F419 Anxiety disorder, unspecified: Secondary | ICD-10-CM

## 2015-02-19 DIAGNOSIS — E7800 Pure hypercholesterolemia, unspecified: Secondary | ICD-10-CM

## 2015-02-19 DIAGNOSIS — F32A Depression, unspecified: Secondary | ICD-10-CM

## 2015-02-19 DIAGNOSIS — I10 Essential (primary) hypertension: Secondary | ICD-10-CM

## 2015-02-19 NOTE — Progress Notes (Signed)
Order placed for f/u labs.  

## 2015-02-21 ENCOUNTER — Other Ambulatory Visit: Payer: Commercial Managed Care - PPO

## 2015-03-03 ENCOUNTER — Other Ambulatory Visit: Payer: Self-pay

## 2015-03-03 MED ORDER — ALPRAZOLAM 1 MG PO TABS: 1.0000 mg | ORAL_TABLET | Freq: Two times a day (BID) | ORAL | Status: DC | PRN

## 2015-03-03 MED ORDER — MELOXICAM 15 MG PO TABS: 15.0000 mg | ORAL_TABLET | Freq: Every day | ORAL | Status: DC

## 2015-03-03 NOTE — Telephone Encounter (Signed)
Patient notified Rx's have been sent to pharmacy as requested.

## 2015-03-03 NOTE — Telephone Encounter (Signed)
Patient called requesting a refill on her Meloxicam and alprazolam Rx. Patient is going to [place] in the a.m. And needs Rx's filled today before leaving. Alprazolam last filled 02/05/15 and Meloxicam last filled 11/14/14. Patient last seen by PCP on 01/09/15. Dr. Nicki Reaper is out of office today. Please advise

## 2015-03-25 ENCOUNTER — Other Ambulatory Visit: Payer: Self-pay | Admitting: *Deleted

## 2015-03-25 MED ORDER — PRAVASTATIN SODIUM 10 MG PO TABS: 10.0000 mg | ORAL_TABLET | Freq: Every day | ORAL | Status: DC

## 2015-03-29 NOTE — Op Note (Signed)
PATIENT NAME:  Angel Riddle, Angel Riddle MR#:  [id number] DATE OF BIRTH:  11-Aug-1956  DATE OF PROCEDURE:  04/19/2014  PREOPERATIVE DIAGNOSIS:  Chronic cholecystitis and cholelithiasis.   POSTOPERATIVE DIAGNOSIS:  Chronic cholecystitis and cholelithiasis.   OPERATIVE PROCEDURE: Laparoscopic cholecystectomy with intraoperative cholangiograms.   SURGEON:  Hervey Ard.   ANESTHESIA:  General endotracheal under Dr. Kayleen Memos.   ESTIMATED BLOOD LOSS:  Less than 5 mL.   CLINICAL NOTE:  This 59 year old woman has had episodic abdominal pain and nausea. Ultrasound showed evidence of cholelithiasis. Upper GI evaluation was negative. She was felt to be a candidate for elective cholecystectomy.   OPERATIVE NOTE: With the patient under adequate general endotracheal anesthesia, the abdomen was prepped with ChloraPrep and draped. In Trendelenburg position, a Veress needle was placed through a transumbilical incision and after assuring intra-abdominal location with the hanging drop test, the abdomen was insufflated with CO2 at 10 mmHg pressure. A 10 mm step port was expanded and inspection showed no evidence of injury from initial port placement. The patient was placed into reverse Trendelenburg position and rolled to the left. An 11 mm Xcel port was placed in the epigastrium and two 5 mm step ports placed laterally. The gallbladder was placed on cephalad traction. The gallbladder was not distended and the gallbladder wall was minimally thickened. There were adhesions from the omentum to the undersurface. The left lobe of the liver flopped over the neck of the gallbladder and by reducing the degree of reverse Trendelenburg, this was easily exposed. After clearing the cystic duct, cholangiograms were completed using 18 mL of one half strength Hypaque and the [place] system. This showed prompt filling of the right and left hepatic ducts, free flow through the common bile duct and into the duodenum. No evidence of retained  stones. The cystic duct and branches of the cystic artery were doubly clipped adjacent to the gallbladder. The gallbladder was removed from the liver bed making use of hook cautery dissection. It was then delivered without difficulty through the umbilical port site. Inspection from the epigastric site showed no evidence of injury from initial port placement. The right upper quadrant was irrigated with lactated Ringer solution. Good hemostasis was noted. The abdomen was then desufflated and ports removed under direct vision. Skin incisions were closed with 4-0 Vicryl subcuticular sutures. Benzoin, Steri-Strips, Telfa and Tegaderm dressings were applied.   The patient tolerated the procedure well and was taken to the recovery room in stable condition.   ____________________________ Robert Bellow, MD jwb:dmm D: 04/19/2014 10:29:26 ET T: 04/19/2014 10:44:18 ET JOB#: [id number]  cc: Robert Bellow, MD, <Dictator> Einar Pheasant, MD JEFFREY Amedeo Kinsman MD ELECTRONICALLY SIGNED 04/21/2014 11:06

## 2015-04-02 ENCOUNTER — Ambulatory Visit
Admit: 2015-04-02 | Disposition: A | Discharge status: Home / Self Care | Payer: Self-pay | Admitting: Physical Medicine and Rehabilitation

## 2015-04-22 ENCOUNTER — Other Ambulatory Visit: Payer: Self-pay | Admitting: *Deleted

## 2015-04-22 MED ORDER — ALPRAZOLAM 1 MG PO TABS: 1.0000 mg | ORAL_TABLET | Freq: Two times a day (BID) | ORAL | Status: DC | PRN

## 2015-04-22 NOTE — Telephone Encounter (Signed)
Last refill 03/03/15 #60, last OV 01/09/15

## 2015-04-22 NOTE — Telephone Encounter (Signed)
Refilled xanax #60 with no refills.

## 2015-04-22 NOTE — Telephone Encounter (Signed)
rx faxed

## 2015-05-12 ENCOUNTER — Encounter: Payer: Commercial Managed Care - PPO | Admitting: Internal Medicine

## 2015-05-14 ENCOUNTER — Other Ambulatory Visit: Payer: 59

## 2015-05-15 ENCOUNTER — Other Ambulatory Visit (INDEPENDENT_AMBULATORY_CARE_PROVIDER_SITE_OTHER): Payer: 59

## 2015-05-15 DIAGNOSIS — F32A Depression, unspecified: Secondary | ICD-10-CM

## 2015-05-15 DIAGNOSIS — F329 Major depressive disorder, single episode, unspecified: Secondary | ICD-10-CM | POA: Diagnosis not present

## 2015-05-15 DIAGNOSIS — F419 Anxiety disorder, unspecified: Secondary | ICD-10-CM

## 2015-05-15 DIAGNOSIS — I10 Essential (primary) hypertension: Secondary | ICD-10-CM | POA: Diagnosis not present

## 2015-05-15 DIAGNOSIS — E7800 Pure hypercholesterolemia, unspecified: Secondary | ICD-10-CM

## 2015-05-15 LAB — HEPATIC FUNCTION PANEL
ALBUMIN: 4.3 g/dL (ref 3.5–5.2)
ALT: 9 U/L (ref 0–35)
AST: 18 U/L (ref 0–37)
Alkaline Phosphatase: 97 U/L (ref 39–117)
BILIRUBIN DIRECT: 0 mg/dL (ref 0.0–0.3)
Total Bilirubin: 0.5 mg/dL (ref 0.2–1.2)
Total Protein: 7.3 g/dL (ref 6.0–8.3)

## 2015-05-15 LAB — CBC WITH DIFFERENTIAL/PLATELET
BASOS ABS: 0 10*3/uL (ref 0.0–0.1)
Basophils Relative: 0.5 % (ref 0.0–3.0)
Eosinophils Absolute: 0.1 10*3/uL (ref 0.0–0.7)
Eosinophils Relative: 1.8 % (ref 0.0–5.0)
HCT: 41.4 % (ref 36.0–46.0)
HEMOGLOBIN: 14.1 g/dL (ref 12.0–15.0)
LYMPHS PCT: 39.9 % (ref 12.0–46.0)
Lymphs Abs: 2.8 10*3/uL (ref 0.7–4.0)
MCHC: 34.1 g/dL (ref 30.0–36.0)
MCV: 86.3 fl (ref 78.0–100.0)
Monocytes Absolute: 0.5 10*3/uL (ref 0.1–1.0)
Monocytes Relative: 7.5 % (ref 3.0–12.0)
Neutro Abs: 3.6 10*3/uL (ref 1.4–7.7)
Neutrophils Relative %: 50.3 % (ref 43.0–77.0)
Platelets: 297 10*3/uL (ref 150.0–400.0)
RBC: 4.8 Mil/uL (ref 3.87–5.11)
RDW: 13 % (ref 11.5–15.5)
WBC: 7.1 10*3/uL (ref 4.0–10.5)

## 2015-05-15 LAB — BASIC METABOLIC PANEL
BUN: 15 mg/dL (ref 6–23)
CHLORIDE: 101 meq/L (ref 96–112)
CO2: 31 meq/L (ref 19–32)
CREATININE: 0.88 mg/dL (ref 0.40–1.20)
Calcium: 9.8 mg/dL (ref 8.4–10.5)
GFR: 69.84 mL/min (ref >60.00)
GLUCOSE: 88 mg/dL (ref 70–99)
Potassium: 3.7 mEq/L (ref 3.5–5.1)
Sodium: 137 mEq/L (ref 135–145)

## 2015-05-15 LAB — LIPID PANEL
CHOLESTEROL: 263 mg/dL — AB (ref 0–200)
HDL: 45.1 mg/dL (ref >39.00)
NonHDL: 217.9
TRIGLYCERIDES: 380 mg/dL — AB (ref 0.0–149.0)
Total CHOL/HDL Ratio: 6
VLDL: 76 mg/dL — AB (ref 0.0–40.0)

## 2015-05-15 LAB — LDL CHOLESTEROL, DIRECT: Direct LDL: 147 mg/dL

## 2015-05-15 LAB — TSH: TSH: 1.41 u[IU]/mL (ref 0.35–4.50)

## 2015-05-16 ENCOUNTER — Other Ambulatory Visit: Payer: Commercial Managed Care - PPO

## 2015-05-19 ENCOUNTER — Encounter: Payer: Self-pay | Admitting: Internal Medicine

## 2015-05-19 ENCOUNTER — Ambulatory Visit (INDEPENDENT_AMBULATORY_CARE_PROVIDER_SITE_OTHER): Payer: 59 | Admitting: Internal Medicine

## 2015-05-19 VITALS — BP 111/71 | HR 92 | Temp 98.6°F | Resp 18 | Ht 65.0 in | Wt 203.0 lb

## 2015-05-19 DIAGNOSIS — I10 Essential (primary) hypertension: Secondary | ICD-10-CM

## 2015-05-19 DIAGNOSIS — E7800 Pure hypercholesterolemia, unspecified: Secondary | ICD-10-CM

## 2015-05-19 DIAGNOSIS — M549 Dorsalgia, unspecified: Secondary | ICD-10-CM | POA: Diagnosis not present

## 2015-05-19 DIAGNOSIS — F32A Depression, unspecified: Secondary | ICD-10-CM

## 2015-05-19 DIAGNOSIS — Z0000 Encounter for general adult medical examination without abnormal findings: Secondary | ICD-10-CM

## 2015-05-19 DIAGNOSIS — R5383 Other fatigue: Secondary | ICD-10-CM

## 2015-05-19 DIAGNOSIS — F419 Anxiety disorder, unspecified: Secondary | ICD-10-CM

## 2015-05-19 DIAGNOSIS — Z86010 Personal history of colonic polyps: Secondary | ICD-10-CM

## 2015-05-19 DIAGNOSIS — F329 Major depressive disorder, single episode, unspecified: Secondary | ICD-10-CM

## 2015-05-19 DIAGNOSIS — G8929 Other chronic pain: Secondary | ICD-10-CM

## 2015-05-19 NOTE — Progress Notes (Signed)
Patient ID: Angel Riddle, female   DOB: Nov 08, 1956, 59 y.o.   MRN: [id number]   Subjective:    Patient ID: Angel Riddle, female    DOB: Dec 20, 1955, 59 y.o.   MRN: [id number]  HPI  Patient here to follow up on her current medical issues as well as for a complete physical exam.  She has been seeing Dr Sharlet Salina for her back.  See his note for details.  Unable to take gabapentin - caused blurred vision.  Off stool softener.  Bowels more regular.  Increased stress.  Brother passed away.  Feels down.  Just does not feel good.  Increased stress with her son and husband's issues.  We discussed this at length.  She is on prozac. Has xanax.  Rarely takes two per day. Discussed referral to psych.  She is in agreement.     Past Medical History  Diagnosis Date  . Arthritis   . Depression   . Allergy   . Heart murmur   . Hypercholesterolemia   . Hx: UTI (urinary tract infection)   . Panic attacks     H/O  . Hx of colonic polyp   . Anxiety   . Chronic back pain     degenerative disc disease  . Gallstones     Current Outpatient Prescriptions on File Prior to Visit  Medication Sig Dispense Refill  . ALPRAZolam (XANAX) 1 MG tablet Take 1 tablet (1 mg total) by mouth 2 (two) times daily as needed for sleep. 60 tablet 0  . esomeprazole (NEXIUM) 40 MG capsule Take 1 capsule (40 mg total) by mouth daily. 30 capsule 2  . FLUoxetine (PROZAC) 40 MG capsule Take 1 capsule (40 mg total) by mouth daily. 30 capsule 2  . fluticasone (FLONASE) 50 MCG/ACT nasal spray Place 1 spray into both nostrils.    . hydrochlorothiazide (HYDRODIURIL) 25 MG tablet Take 0.5 tablets (12.5 mg total) by mouth daily. 15 tablet 5  . hydrocortisone (CORTEF) 10 MG tablet Take 1 tablet by mouth daily.    . meloxicam (MOBIC) 15 MG tablet Take 1 tablet (15 mg total) by mouth daily. 30 tablet 2  . pravastatin (PRAVACHOL) 10 MG tablet Take 1 tablet (10 mg total) by mouth daily. 30 tablet 5  . promethazine (PHENERGAN) 25 MG tablet  Take 1 tablet (25 mg total) by mouth 2 (two) times daily as needed for nausea. 30 tablet 0  . Pseudoephedrine HCl 240 MG TB24 Take by mouth as needed.    . traMADol (ULTRAM) 50 MG tablet      No current facility-administered medications on file prior to visit.    Review of Systems  Constitutional: Positive for fatigue.       Is concerned about gaining weight.    HENT: Negative for congestion and sinus pressure.   Respiratory: Negative for cough, chest tightness and shortness of breath.   Cardiovascular: Negative for chest pain, palpitations and leg swelling.  Gastrointestinal: Negative for nausea, vomiting, abdominal pain and diarrhea.  Musculoskeletal: Positive for back pain. Negative for joint swelling.  Neurological: Negative for dizziness, light-headedness and headaches.  Psychiatric/Behavioral:       Increased stress as outlined.         Objective:    Physical Exam  Constitutional: She is oriented to person, place, and time. She appears well-developed and well-nourished. No distress.  HENT:  Nose: Nose normal.  Mouth/Throat: Oropharynx is clear and moist.  Eyes: Right eye exhibits no discharge. Left eye exhibits  no discharge. No scleral icterus.  Neck: Neck supple. No thyromegaly present.  Cardiovascular: Normal rate and regular rhythm.   Pulmonary/Chest: Breath sounds normal. No accessory muscle usage. No tachypnea. No respiratory distress. She has no decreased breath sounds. She has no wheezes. She has no rhonchi. Right breast exhibits no inverted nipple, no mass, no nipple discharge and no tenderness (no axillary adenopathy). Left breast exhibits no inverted nipple, no mass, no nipple discharge and no tenderness (no axilarry adenopathy).  Abdominal: Soft. Bowel sounds are normal. There is no tenderness.  Musculoskeletal: She exhibits no edema or tenderness.  Lymphadenopathy:    She has no cervical adenopathy.  Neurological: She is alert and oriented to person, place, and  time.  Skin: Skin is warm. No rash noted. No erythema.  Psychiatric: She has a normal mood and affect. Her behavior is normal.    BP 111/71 mmHg  Pulse 92  Temp(Src) 98.6 F (37 C) (Oral)  Resp 18  Ht 5\' 5"  (1.651 m)  Wt 203 lb (92.08 kg)  BMI 33.78 kg/m2  SpO2 96% Wt Readings from Last 3 Encounters:  05/19/15 203 lb (92.08 kg)  10/08/14 182 lb 12 oz (82.895 kg)  08/05/14 171 lb 4 oz (77.678 kg)     Lab Results  Component Value Date   WBC 7.1 05/15/2015   HGB 14.1 05/15/2015   HCT 41.4 05/15/2015   PLT 297.0 05/15/2015   GLUCOSE 88 05/15/2015   CHOL 263* 05/15/2015   TRIG 380.0* 05/15/2015   HDL 45.10 05/15/2015   LDLDIRECT 147.0 05/15/2015   LDLCALC 218* 03/14/2014   ALT 9 05/15/2015   AST 18 05/15/2015   NA 137 05/15/2015   K 3.7 05/15/2015   CL 101 05/15/2015   CREATININE 0.88 05/15/2015   BUN 15 05/15/2015   CO2 31 05/15/2015   TSH 1.41 05/15/2015    Mr [facility] W/o Cm  04/02/2015   CLINICAL DATA:  Low back pain with bilateral leg pain extending the posterior knees, worse on the right. Pain is worse after standing or sitting. There is been progression over the last 3 months.  EXAM: MRI LUMBAR SPINE WITHOUT CONTRAST  TECHNIQUE: Multiplanar, multisequence MR imaging of the lumbar spine was performed. No intravenous contrast was administered.  COMPARISON:  Report of MRI lumbar spine 04/10/2008  FINDINGS: Normal signal is present in the conus medullaris which terminates at L1. Chronic endplate marrow changes are evident at L4-5. Marrow signal and vertebral body heights are otherwise normal. Slight retrolisthesis is evident at L3-4 and L4-5.  Limited imaging of the abdomen is unremarkable  L1-2: Mild facet hypertrophy is present bilaterally. There is no significant stenosis.  L2-3: Moderate facet hypertrophy is present bilaterally. There is no significant stenosis.  L3-4: A broad-based disc protrusion is asymmetric to the left. Moderate facet hypertrophy is present  bilaterally. Mild subarticular narrowing is worse on the left. Mild foraminal narrowing is worse on the right.  L4-5: A broad-based disc protrusion is present. Moderate facet hypertrophy is noted bilaterally. Moderate foraminal stenosis is worse on the right. Mild subarticular stenosis is evident bilaterally.  L5-S1:  Negative.  IMPRESSION: 1. By report, there is progression of multilevel acquired spinal stenosis. 2. Mild subarticular narrowing at L3-4 is worse on the left. 3. Mild foraminal narrowing at L3-4 is worse on the right. 4. Moderate foraminal stenosis and mild subarticular narrowing bilaterally at L4-5 is worse on the right.   Electronically Signed   By: Wynetta Fines.D.  On: 04/02/2015 16:50       Assessment & Plan:   Problem List Items Addressed This Visit    Anxiety    Increased anxiety and stress as outlined.  On prozac.  Has xanax.  Discussed at length.  Refer to psychiatry.        Relevant Orders   Ambulatory referral to Psychiatry   Chronic back pain    Seeing Dr Sharlet Salina.  Desires not to take increased medications.        Relevant Medications   HYDROcodone-acetaminophen (NORCO/VICODIN) 5-325 MG per tablet   Depression    Discussed at length with her today.  No suicidal ideations.  Refer to psychiatry.   Check B12.       Relevant Orders   Ambulatory referral to Psychiatry   Health care maintenance    Physical today 05/19/15.  Mammogram 10/09/14 - Birads I.       History of colonic polyps    Has not had colonoscopy.  Needs.       Hypercholesterolemia    Cholesterol has improved.  Tolerating pravastatin.  Hold on making changes in her medication regimen.  Follow.       Hypertension    Blood pressure has been under good control.  Same medication regimen.  Follow pressures.  Follow metabolic panel.         Other Visit Diagnoses    Other fatigue    -  Primary    Relevant Orders    Vitamin B12 (Completed)      I spent 25 minutes with the patient and  more than 50% of the time was spent in consultation regarding the above.     Einar Pheasant, MD

## 2015-05-19 NOTE — Progress Notes (Signed)
Pre visit review using our clinic review tool, if applicable. No additional management support is needed unless otherwise documented below in the visit note. 

## 2015-05-20 LAB — VITAMIN B12: Vitamin B-12: 198 pg/mL — ABNORMAL LOW (ref 211–911)

## 2015-05-22 ENCOUNTER — Ambulatory Visit (INDEPENDENT_AMBULATORY_CARE_PROVIDER_SITE_OTHER): Payer: 59

## 2015-05-22 DIAGNOSIS — E538 Deficiency of other specified B group vitamins: Secondary | ICD-10-CM | POA: Diagnosis not present

## 2015-05-22 MED ORDER — CYANOCOBALAMIN 1000 MCG/ML IJ SOLN
1000.0000 ug | Freq: Once | INTRAMUSCULAR | Status: AC
  Administered 2015-05-22: 1000 ug via INTRAMUSCULAR

## 2015-05-22 NOTE — Progress Notes (Signed)
Patient came in for 1st B 12 injection for this go round.  Received injection in Left deltoid.  Patient tolerated well.

## 2015-05-26 ENCOUNTER — Encounter: Payer: Self-pay | Admitting: Internal Medicine

## 2015-05-26 NOTE — Assessment & Plan Note (Signed)
Increased anxiety and stress as outlined.  On prozac.  Has xanax.  Discussed at length.  Refer to psychiatry.

## 2015-05-26 NOTE — Assessment & Plan Note (Signed)
Has not had colonoscopy.  Needs.

## 2015-05-26 NOTE — Assessment & Plan Note (Signed)
Physical today 05/19/15.  Mammogram 10/09/14 - Birads I.

## 2015-05-26 NOTE — Assessment & Plan Note (Signed)
Blood pressure has been under good control.  Same medication regimen.  Follow pressures.   Follow metabolic panel.   

## 2015-05-26 NOTE — Assessment & Plan Note (Signed)
Seeing Dr Sharlet Salina.  Desires not to take increased medications.

## 2015-05-26 NOTE — Assessment & Plan Note (Signed)
Cholesterol has improved.  Tolerating pravastatin.  Hold on making changes in her medication regimen.  Follow.

## 2015-05-26 NOTE — Assessment & Plan Note (Addendum)
Discussed at length with her today.  No suicidal ideations.  Refer to psychiatry.   Check B12.

## 2015-05-30 ENCOUNTER — Ambulatory Visit (INDEPENDENT_AMBULATORY_CARE_PROVIDER_SITE_OTHER): Payer: 59

## 2015-05-30 DIAGNOSIS — E538 Deficiency of other specified B group vitamins: Secondary | ICD-10-CM | POA: Diagnosis not present

## 2015-05-30 MED ORDER — CYANOCOBALAMIN 1000 MCG/ML IJ SOLN
1000.0000 ug | Freq: Once | INTRAMUSCULAR | Status: AC
  Administered 2015-05-30: 1000 ug via INTRAMUSCULAR

## 2015-05-30 NOTE — Progress Notes (Signed)
Pt here for B-12 injection.

## 2015-06-05 ENCOUNTER — Ambulatory Visit (INDEPENDENT_AMBULATORY_CARE_PROVIDER_SITE_OTHER): Payer: 59 | Admitting: *Deleted

## 2015-06-05 DIAGNOSIS — E538 Deficiency of other specified B group vitamins: Secondary | ICD-10-CM | POA: Diagnosis not present

## 2015-06-05 MED ORDER — CYANOCOBALAMIN 1000 MCG/ML IJ SOLN
1000.0000 ug | Freq: Once | INTRAMUSCULAR | Status: AC
  Administered 2015-06-05: 1000 ug via INTRAMUSCULAR

## 2015-06-12 ENCOUNTER — Ambulatory Visit (INDEPENDENT_AMBULATORY_CARE_PROVIDER_SITE_OTHER): Payer: 59 | Admitting: *Deleted

## 2015-06-12 ENCOUNTER — Telehealth: Payer: Self-pay | Admitting: Internal Medicine

## 2015-06-12 ENCOUNTER — Other Ambulatory Visit: Payer: Self-pay | Admitting: *Deleted

## 2015-06-12 DIAGNOSIS — E538 Deficiency of other specified B group vitamins: Secondary | ICD-10-CM | POA: Diagnosis not present

## 2015-06-12 MED ORDER — CYANOCOBALAMIN 1000 MCG/ML IJ SOLN
1000.0000 ug | Freq: Once | INTRAMUSCULAR | Status: AC
  Administered 2015-06-12: 1000 ug via INTRAMUSCULAR

## 2015-06-12 MED ORDER — ALPRAZOLAM 1 MG PO TABS: 1.0000 mg | ORAL_TABLET | Freq: Two times a day (BID) | ORAL | Status: DC | PRN

## 2015-06-12 NOTE — Telephone Encounter (Signed)
Please check on status of her referral.  It looks like info was sent to [facility].  Thanks.

## 2015-06-12 NOTE — Telephone Encounter (Signed)
ok'd refill xanax #60 with no refills.

## 2015-06-12 NOTE — Telephone Encounter (Signed)
Last appt 05/19/15, refill?

## 2015-06-12 NOTE — Telephone Encounter (Signed)
Please advise 

## 2015-06-12 NOTE — Telephone Encounter (Signed)
Pt wanted to know the status of referral to Dr. Nicolasa Ducking. No referral in the system.msn

## 2015-06-13 NOTE — Telephone Encounter (Signed)
Faxed to pharmacy

## 2015-06-18 ENCOUNTER — Other Ambulatory Visit: Payer: Self-pay | Admitting: *Deleted

## 2015-06-18 MED ORDER — MELOXICAM 15 MG PO TABS: 15.0000 mg | ORAL_TABLET | Freq: Every day | ORAL | Status: DC

## 2015-06-18 MED ORDER — HYDROCHLOROTHIAZIDE 25 MG PO TABS: 12.5000 mg | ORAL_TABLET | Freq: Every day | ORAL | Status: DC

## 2015-06-24 ENCOUNTER — Encounter: Payer: Self-pay | Admitting: Internal Medicine

## 2015-07-03 ENCOUNTER — Ambulatory Visit: Payer: 59 | Admitting: *Deleted

## 2015-07-17 ENCOUNTER — Telehealth: Payer: Self-pay | Admitting: *Deleted

## 2015-07-17 MED ORDER — ONDANSETRON HCL 4 MG PO TABS: 4.0000 mg | ORAL_TABLET | Freq: Two times a day (BID) | ORAL | Status: DC | PRN

## 2015-07-17 NOTE — Telephone Encounter (Signed)
How long has this been going on?  If she is having persistent diarrhea and nausea - needs evaluation.  May need fluids.  [facility] or ER.

## 2015-07-17 NOTE — Telephone Encounter (Signed)
Informed pt of instruction to be seen. Pt stated that her husband is out of town with a church group and she is as weak as water. She is afraid to leave the house because of the diarrhea and nausea. She is running to the bathroom with diarrhea and when she gets up she has a lot of nausea. Said she was nibbling on saltines and ginger ale.

## 2015-07-17 NOTE — Telephone Encounter (Signed)
If she is having worsening nausea, needs to be evaluated.

## 2015-07-17 NOTE — Telephone Encounter (Signed)
zofran rx sent in to [facility] #20 with no refills.  Spoke to pt.  No vomiting.  Just nausea.  Diarrhea better.  Able to tolerate crackers.  Resting.  No abdominal pain or fever.  Just started 24 hours ago.  Take zofran as directed.  Bland foods.  Call with update.  Any worsening - needs evaluation.

## 2015-07-17 NOTE — Telephone Encounter (Signed)
Patient has requested another nausea medication. Patient stated that her current medication has not worked with her nausea. Her [facility]Thanks

## 2015-07-17 NOTE — Telephone Encounter (Signed)
Pt stated that it has been going on since last night, I informed her that she would need to be seen and evaluated. She said she would have to wait until tomorrow because she was afraid to drive tonight, other than that she would see you on the 8.24.16.

## 2015-07-22 ENCOUNTER — Ambulatory Visit: Payer: 59

## 2015-07-24 ENCOUNTER — Ambulatory Visit: Payer: Self-pay | Admitting: Psychiatry

## 2015-07-29 ENCOUNTER — Other Ambulatory Visit: Payer: Self-pay | Admitting: Surgical

## 2015-07-29 MED ORDER — ALPRAZOLAM 1 MG PO TABS: 1.0000 mg | ORAL_TABLET | Freq: Two times a day (BID) | ORAL | Status: DC | PRN

## 2015-07-29 MED ORDER — FLUOXETINE HCL 40 MG PO CAPS: 40.0000 mg | ORAL_CAPSULE | Freq: Every day | ORAL | Status: DC

## 2015-07-29 NOTE — Telephone Encounter (Signed)
Faxed in refill

## 2015-07-29 NOTE — Telephone Encounter (Signed)
LM for patient to call back.

## 2015-07-29 NOTE — Telephone Encounter (Signed)
I have ok'd refill for prozac #30 with 2 refills and xanax #60 with one refill.  rx for xanax signed and placed on your desk.  Please let her know that I want her to try and limit tramadol since taking the prozac.

## 2015-07-29 NOTE — Telephone Encounter (Signed)
Please advise on refill of Xanax and Prozac.

## 2015-07-30 ENCOUNTER — Ambulatory Visit (INDEPENDENT_AMBULATORY_CARE_PROVIDER_SITE_OTHER): Payer: 59 | Admitting: Internal Medicine

## 2015-07-30 ENCOUNTER — Ambulatory Visit
Admission: RE | Admit: 2015-07-30 | Discharge: 2015-07-30 | Disposition: A | Discharge status: Home / Self Care | Payer: 59 | Source: Ambulatory Visit | Attending: Internal Medicine | Admitting: Internal Medicine

## 2015-07-30 ENCOUNTER — Encounter: Payer: Self-pay | Admitting: Internal Medicine

## 2015-07-30 VITALS — BP 100/80 | HR 88 | Temp 98.4°F | Ht 65.0 in | Wt 202.5 lb

## 2015-07-30 DIAGNOSIS — M549 Dorsalgia, unspecified: Secondary | ICD-10-CM

## 2015-07-30 DIAGNOSIS — Z1231 Encounter for screening mammogram for malignant neoplasm of breast: Secondary | ICD-10-CM | POA: Insufficient documentation

## 2015-07-30 DIAGNOSIS — Z86010 Personal history of colonic polyps: Secondary | ICD-10-CM

## 2015-07-30 DIAGNOSIS — R3911 Hesitancy of micturition: Secondary | ICD-10-CM | POA: Insufficient documentation

## 2015-07-30 DIAGNOSIS — R059 Cough, unspecified: Secondary | ICD-10-CM

## 2015-07-30 DIAGNOSIS — I10 Essential (primary) hypertension: Secondary | ICD-10-CM | POA: Diagnosis not present

## 2015-07-30 DIAGNOSIS — E7800 Pure hypercholesterolemia, unspecified: Secondary | ICD-10-CM

## 2015-07-30 DIAGNOSIS — R05 Cough: Secondary | ICD-10-CM | POA: Insufficient documentation

## 2015-07-30 DIAGNOSIS — Z1239 Encounter for other screening for malignant neoplasm of breast: Secondary | ICD-10-CM | POA: Diagnosis not present

## 2015-07-30 DIAGNOSIS — F419 Anxiety disorder, unspecified: Secondary | ICD-10-CM

## 2015-07-30 DIAGNOSIS — M79602 Pain in left arm: Secondary | ICD-10-CM

## 2015-07-30 DIAGNOSIS — F329 Major depressive disorder, single episode, unspecified: Secondary | ICD-10-CM

## 2015-07-30 DIAGNOSIS — G8929 Other chronic pain: Secondary | ICD-10-CM

## 2015-07-30 DIAGNOSIS — F32A Depression, unspecified: Secondary | ICD-10-CM

## 2015-07-30 MED ORDER — PANTOPRAZOLE SODIUM 40 MG PO TBEC: 40.0000 mg | DELAYED_RELEASE_TABLET | Freq: Every day | ORAL | Status: DC

## 2015-07-30 NOTE — Progress Notes (Signed)
Patient ID: Angel Riddle, female   DOB: 05-Aug-1956, 59 y.o.   MRN: [id number]   Subjective:    Patient ID: Angel Riddle, female    DOB: Jun 08, 1956, 59 y.o.   MRN: [id number]  HPI  Patient here for a scheduled follow up.  She has had a cough x one month.  Some increased congestion.  Hurts to cough.  Taking nyquil and drinking ginger ale.  Feels has dust in her throat.  Increased sinus drainage.  Some coughing fits.  No acid reflux.  Some pain in her left arm and shoulder.  Intermittent pain for 6 weeks.  No chest pain or tightness.  Increased stress.  Mostly involves her son.  He is moving out soon.  Sister was in an accident.  Some increased family stressors.  No abdominal pain or cramping.  Some urinary hesitancy.  No dysuria.     Past Medical History  Diagnosis Date  . Arthritis   . Depression   . Allergy   . Heart murmur   . Hypercholesterolemia   . Hx: UTI (urinary tract infection)   . Panic attacks     H/O  . Hx of colonic polyp   . Anxiety   . Chronic back pain     degenerative disc disease  . Gallstones    Past Surgical History  Procedure Laterality Date  . Abdominal hysterectomy  1992    partial  . Tonsillectomy and adenoidectomy  1969  . Skin surgery Left 08/27/13    Basal cell removal-left nostril  . Foot surgery Right 2009  . Cataract extraction Bilateral [id number]  . Cholecystectomy  04-19-14  . Colonoscopy  07/02/11    DR. Byrnett   Family History  Problem Relation Age of Onset  . Hyperlipidemia Mother   . Hypertension Mother   . Alcohol abuse Father   . Hyperlipidemia Father   . Heart disease Father   . Diabetes Father   . Hyperlipidemia Sister   . Lung cancer Brother   . Hyperlipidemia Brother    Social History   Social History  . Marital Status: Married    Spouse Name: N/A  . Number of Children: 2  . Years of Education: N/A   Social History Main Topics  . Smoking status: Former Research scientist (life sciences)  . Smokeless tobacco: Never Used  . Alcohol Use: 0.0  oz/week    0 Standard drinks or equivalent per week  . Drug Use: No  . Sexual Activity: Not Asked   Other Topics Concern  . None   Social History Narrative    Outpatient Encounter Prescriptions as of 07/30/2015  Medication Sig  . ALPRAZolam (XANAX) 1 MG tablet Take 1 tablet (1 mg total) by mouth 2 (two) times daily as needed for sleep.  Marland Kitchen FLUoxetine (PROZAC) 40 MG capsule Take 1 capsule (40 mg total) by mouth daily.  . fluticasone (FLONASE) 50 MCG/ACT nasal spray Place 1 spray into both nostrils.  Marland Kitchen gabapentin (NEURONTIN) 100 MG capsule Take 200 mg by mouth at bedtime.  . hydrochlorothiazide (HYDRODIURIL) 25 MG tablet Take 0.5 tablets (12.5 mg total) by mouth daily.  Marland Kitchen HYDROcodone-acetaminophen (NORCO/VICODIN) 5-325 MG per tablet Take 5-325 tablets by mouth 2 (two) times daily as needed.  . meloxicam (MOBIC) 15 MG tablet Take 1 tablet (15 mg total) by mouth daily.  . ondansetron (ZOFRAN) 4 MG tablet Take 1 tablet (4 mg total) by mouth 2 (two) times daily as needed for nausea or vomiting.  . pravastatin (PRAVACHOL) 10  MG tablet Take 1 tablet (10 mg total) by mouth daily.  . Pseudoeph-Doxylamine-DM-APAP (NYQUIL PO) Take by mouth.  . Pseudoephedrine HCl 240 MG TB24 Take by mouth as needed.  . [DISCONTINUED] esomeprazole (NEXIUM) 40 MG capsule Take 1 capsule (40 mg total) by mouth daily.  . pantoprazole (PROTONIX) 40 MG tablet Take 1 tablet (40 mg total) by mouth daily.  . [DISCONTINUED] hydrocortisone (CORTEF) 10 MG tablet Take 1 tablet by mouth daily.  . [DISCONTINUED] traMADol (ULTRAM) 50 MG tablet    No facility-administered encounter medications on file as of 07/30/2015.    Review of Systems  Constitutional: Negative for appetite change and unexpected weight change.  HENT: Positive for congestion and postnasal drip. Negative for sinus pressure.   Eyes: Negative for discharge and visual disturbance.  Respiratory: Positive for cough. Negative for chest tightness and shortness of  breath.   Cardiovascular: Negative for chest pain, palpitations and leg swelling.  Gastrointestinal: Negative for nausea, vomiting, abdominal pain and diarrhea.  Genitourinary: Negative for dysuria.       Some urinary hesitancy.    Musculoskeletal: Negative for joint swelling.       Left arm pain as outlined.  Intermittent.  Not reproducible on exam.    Skin: Negative for color change and rash.  Neurological: Negative for dizziness, light-headedness and headaches.  Psychiatric/Behavioral: Negative for dysphoric mood and agitation.       Increased stress as outlined.         Objective:    Physical Exam  Constitutional: She appears well-developed and well-nourished. No distress.  HENT:  Nose: Nose normal.  Mouth/Throat: Oropharynx is clear and moist.  Eyes: Conjunctivae are normal. Right eye exhibits no discharge. Left eye exhibits no discharge.  Neck: Neck supple. No thyromegaly present.  Cardiovascular: Normal rate and regular rhythm.   Pulmonary/Chest: Breath sounds normal. No respiratory distress.  Some increased cough with forced expiration.    Abdominal: Soft. Bowel sounds are normal. There is no tenderness.  Musculoskeletal: She exhibits no edema or tenderness.  Lymphadenopathy:    She has no cervical adenopathy.  Skin: No rash noted. No erythema.  Psychiatric: She has a normal mood and affect. Her behavior is normal.    BP 100/80 mmHg  Pulse 88  Temp(Src) 98.4 F (36.9 C) (Oral)  Ht 5\' 5"  (1.651 m)  Wt 202 lb 8 oz (91.853 kg)  BMI 33.70 kg/m2  SpO2 96% Wt Readings from Last 3 Encounters:  07/30/15 202 lb 8 oz (91.853 kg)  05/19/15 203 lb (92.08 kg)  10/08/14 182 lb 12 oz (82.895 kg)     Lab Results  Component Value Date   WBC 7.1 05/15/2015   HGB 14.1 05/15/2015   HCT 41.4 05/15/2015   PLT 297.0 05/15/2015   GLUCOSE 88 05/15/2015   CHOL 263* 05/15/2015   TRIG 380.0* 05/15/2015   HDL 45.10 05/15/2015   LDLDIRECT 147.0 05/15/2015   LDLCALC 218*  03/14/2014   ALT 9 05/15/2015   AST 18 05/15/2015   NA 137 05/15/2015   K 3.7 05/15/2015   CL 101 05/15/2015   CREATININE 0.88 05/15/2015   BUN 15 05/15/2015   CO2 31 05/15/2015   TSH 1.41 05/15/2015       Assessment & Plan:   Problem List Items Addressed This Visit    Anxiety    Discussed at length with her today.  Continue current med regimen as outlined.  Has appt with psychiatry.  Keep appt.  Son will be moving out  soon.  Follow.  Desires no further intervention at this time.        Chronic back pain    Has been seeing Dr Sharlet Salina.  Follow.        Cough - Primary    Persistent cough.  Robitussin/mucinex and inhalers as outlined.  Saline nasal spray and flonase nasal spray as outlined.  Follow.  Hold abx at this point.  If worsening symptoms, may need abx and possibly prednisone.  Follow.  Check cxr.        Depression    Discussed at length.  Has f/u appt with psychiatry.  Continue current medication regimen.  Follow.        History of colonic polyps    Overdue colonoscopy.  Refer to GI.        Relevant Orders   Ambulatory referral to Gastroenterology   Hypercholesterolemia    Low cholesterol diet and exercise.  On pravastatin - low dose.  Follow lipid panel and liver function tests.   Lab Results  Component Value Date   CHOL 263* 05/15/2015   HDL 45.10 05/15/2015   LDLCALC 218* 03/14/2014   LDLDIRECT 147.0 05/15/2015   TRIG 380.0* 05/15/2015   CHOLHDL 6 05/15/2015        Hypertension    Blood pressure under good control.  Continue same medication regimen.  Follow pressures.  Follow metabolic panel.        Left arm pain    Unclear etiology.  Tylenol as directed.  Follow.  Notify me if persistent pain.      Urinary hesitancy    Check urinalysis to confirm no infection.        Relevant Orders   Urinalysis, Routine w reflex microscopic (not at [facility])   CULTURE, URINE COMPREHENSIVE    Other Visit Diagnoses    Breast cancer screening        Relevant  Orders    MM DIGITAL SCREENING BILATERAL    DG Chest 2 View (Completed)        Einar Pheasant, MD

## 2015-07-30 NOTE — Patient Instructions (Signed)
Saline nasal spray - flush nose at least 2-3x/day  flonase nasal spray - 2 sprays each nostril one time per day.  Do this in the evening.   

## 2015-07-30 NOTE — Progress Notes (Signed)
Pre-visit discussion using our clinic review tool. No additional management support is needed unless otherwise documented below in the visit note.  

## 2015-07-31 ENCOUNTER — Telehealth: Payer: Self-pay | Admitting: *Deleted

## 2015-07-31 MED ORDER — CEFDINIR 300 MG PO CAPS: 300.0000 mg | ORAL_CAPSULE | Freq: Two times a day (BID) | ORAL | Status: DC

## 2015-07-31 MED ORDER — PREDNISONE 10 MG PO TABS: ORAL_TABLET | ORAL | Status: DC

## 2015-07-31 NOTE — Telephone Encounter (Signed)
Confirm no other symptom change, i.e. Any sob, wheezing, chest tightness.   Why does she think she needs prednisone now?

## 2015-07-31 NOTE — Telephone Encounter (Signed)
Pt called states her dry cough has progressed to yellow mucus.  Pt is requesting prednisone.  Please advise

## 2015-07-31 NOTE — Telephone Encounter (Signed)
Spoke to pt.  Just evaluated yesterday.  She states congestion now breaking up.  Increased coughing episodes.  Has taken prednisone previously and tolerates.  Will also send in abx if needed.  Instructed to take probiotic with the abx and for two weeks after completing the abx.  Will call if any change or worsening problems.

## 2015-07-31 NOTE — Telephone Encounter (Signed)
Spoke with pt, she states she is having no other symptoms.  Pt also states she does need the prednisone now.

## 2015-08-03 ENCOUNTER — Encounter: Payer: Self-pay | Admitting: Internal Medicine

## 2015-08-03 DIAGNOSIS — M79602 Pain in left arm: Secondary | ICD-10-CM | POA: Insufficient documentation

## 2015-08-03 NOTE — Assessment & Plan Note (Signed)
Low cholesterol diet and exercise.  On pravastatin - low dose.  Follow lipid panel and liver function tests.   Lab Results  Component Value Date   CHOL 263* 05/15/2015   HDL 45.10 05/15/2015   LDLCALC 218* 03/14/2014   LDLDIRECT 147.0 05/15/2015   TRIG 380.0* 05/15/2015   CHOLHDL 6 05/15/2015

## 2015-08-03 NOTE — Assessment & Plan Note (Signed)
Discussed at length.  Has f/u appt with psychiatry.  Continue current medication regimen.  Follow.

## 2015-08-03 NOTE — Assessment & Plan Note (Signed)
Overdue colonoscopy.  Refer to GI.

## 2015-08-03 NOTE — Assessment & Plan Note (Signed)
Has been seeing Dr Sharlet Salina.  Follow.

## 2015-08-03 NOTE — Assessment & Plan Note (Addendum)
Persistent cough.  Robitussin/mucinex and inhalers as outlined.  Saline nasal spray and flonase nasal spray as outlined.  Follow.  Hold abx at this point.  If worsening symptoms, may need abx and possibly prednisone.  Follow.  Check cxr.

## 2015-08-03 NOTE — Assessment & Plan Note (Signed)
Unclear etiology.  Tylenol as directed.  Follow.  Notify me if persistent pain.

## 2015-08-03 NOTE — Assessment & Plan Note (Signed)
Blood pressure under good control.  Continue same medication regimen.  Follow pressures.  Follow metabolic panel.   

## 2015-08-03 NOTE — Assessment & Plan Note (Signed)
Discussed at length with her today.  Continue current med regimen as outlined.  Has appt with psychiatry.  Keep appt.  Son will be moving out soon.  Follow.  Desires no further intervention at this time.

## 2015-08-03 NOTE — Assessment & Plan Note (Signed)
Check urinalysis to confirm no infection.  

## 2015-08-13 ENCOUNTER — Encounter: Payer: Self-pay | Admitting: Internal Medicine

## 2015-08-13 ENCOUNTER — Telehealth: Payer: Self-pay | Admitting: Internal Medicine

## 2015-08-13 NOTE — Telephone Encounter (Signed)
Pt wanted to know if she needs to get another B12 shot when she comes in for her follow up visit. Pt also wanted to let Dr. Nicki Reaper know that her son has moved out and she is feeling better. Pt stated that she may cancel the appt with psychology.

## 2015-08-13 NOTE — Telephone Encounter (Signed)
Pt wanted to know if she needs to get another B12 shot when she comes in for her follow up visit. Pt also wanted to let Dr. Nicki Reaper know that her son has moved out know and she is feel better and may cancel appt with psychology.

## 2015-08-13 NOTE — Telephone Encounter (Signed)
She needs to continue monthly B12 injections.  I would still recommend f/u with psychiatry given the issues we have discussed.

## 2015-08-13 NOTE — Telephone Encounter (Signed)
Last B12 given in July-see remainder of message

## 2015-08-18 ENCOUNTER — Ambulatory Visit: Payer: Self-pay | Admitting: Psychiatry

## 2015-08-18 NOTE — Telephone Encounter (Signed)
Left voicemail to notify patient

## 2015-08-20 ENCOUNTER — Ambulatory Visit (INDEPENDENT_AMBULATORY_CARE_PROVIDER_SITE_OTHER): Payer: 59 | Admitting: *Deleted

## 2015-08-20 DIAGNOSIS — E538 Deficiency of other specified B group vitamins: Secondary | ICD-10-CM

## 2015-08-20 DIAGNOSIS — Z23 Encounter for immunization: Secondary | ICD-10-CM

## 2015-08-20 MED ORDER — CYANOCOBALAMIN 1000 MCG/ML IJ SOLN
1000.0000 ug | Freq: Once | INTRAMUSCULAR | Status: AC
  Administered 2015-08-20: 1000 ug via INTRAMUSCULAR

## 2015-09-01 ENCOUNTER — Telehealth: Payer: Self-pay | Admitting: Gastroenterology

## 2015-09-01 NOTE — Telephone Encounter (Signed)
Patient got letter to schedule for colonoscopy

## 2015-09-05 ENCOUNTER — Other Ambulatory Visit: Payer: Self-pay

## 2015-09-05 NOTE — Telephone Encounter (Signed)
Gastroenterology Pre-Procedure Review  Request Date:  Requesting Physician: Dr. Einar Pheasant  PATIENT REVIEW QUESTIONS: The patient responded to the following health history questions as indicated:    1. Are you having any GI issues? no Some constipation/Not being regular  2. Do you have a personal history of Polyps? yes (repeat 5 years) 3. Do you have a family history of Colon Cancer or Polyps? no 4. Diabetes Mellitus? no 5. Joint replacements in the past 12 months?no 6. Major health problems in the past 3 months?no 7. Any artificial heart valves, MVP, or defibrillator?no    MEDICATIONS & ALLERGIES:    Patient reports the following regarding taking any anticoagulation/antiplatelet therapy:   Plavix, Coumadin, Eliquis, Xarelto, Lovenox, Pradaxa, Brilinta, or Effient? no Aspirin? no  Patient confirms/reports the following medications:  Current Outpatient Prescriptions  Medication Sig Dispense Refill  . ALPRAZolam (XANAX) 1 MG tablet Take 1 tablet (1 mg total) by mouth 2 (two) times daily as needed for sleep. 60 tablet 1  . cefdinir (OMNICEF) 300 MG capsule Take 1 capsule (300 mg total) by mouth 2 (two) times daily. 20 capsule 0  . FLUoxetine (PROZAC) 40 MG capsule Take 1 capsule (40 mg total) by mouth daily. 30 capsule 2  . fluticasone (FLONASE) 50 MCG/ACT nasal spray Place 1 spray into both nostrils.    Marland Kitchen gabapentin (NEURONTIN) 100 MG capsule Take 200 mg by mouth at bedtime.    . hydrochlorothiazide (HYDRODIURIL) 25 MG tablet Take 0.5 tablets (12.5 mg total) by mouth daily. 15 tablet 5  . HYDROcodone-acetaminophen (NORCO/VICODIN) 5-325 MG per tablet Take 5-325 tablets by mouth 2 (two) times daily as needed.    . meloxicam (MOBIC) 15 MG tablet Take 1 tablet (15 mg total) by mouth daily. 30 tablet 2  . ondansetron (ZOFRAN) 4 MG tablet Take 1 tablet (4 mg total) by mouth 2 (two) times daily as needed for nausea or vomiting. 20 tablet 0  . pantoprazole (PROTONIX) 40 MG tablet Take 1  tablet (40 mg total) by mouth daily. 30 tablet 3  . pravastatin (PRAVACHOL) 10 MG tablet Take 1 tablet (10 mg total) by mouth daily. 30 tablet 5  . predniSONE (DELTASONE) 10 MG tablet Take 6 tablets x 1 day and then decrease by 1/2 tablet per day until down to zero mg. 39 tablet 0  . Pseudoeph-Doxylamine-DM-APAP (NYQUIL PO) Take by mouth.    . Pseudoephedrine HCl 240 MG TB24 Take by mouth as needed.     No current facility-administered medications for this visit.    Patient confirms/reports the following allergies:  Allergies  Allergen Reactions  . Levaquin [Levofloxacin In D5w]     Muscle pain  . Ivp Dye [Iodinated Diagnostic Agents]     No orders of the defined types were placed in this encounter.    AUTHORIZATION INFORMATION Primary Insurance: 1D#: Group #:  Secondary Insurance: 1D#: Group #:  SCHEDULE INFORMATION: Date: 09/25/15 Time: Location: [facility]

## 2015-09-10 ENCOUNTER — Encounter: Payer: Self-pay | Admitting: Internal Medicine

## 2015-09-10 ENCOUNTER — Ambulatory Visit (INDEPENDENT_AMBULATORY_CARE_PROVIDER_SITE_OTHER): Payer: 59 | Admitting: Internal Medicine

## 2015-09-10 VITALS — BP 110/70 | HR 108 | Temp 98.1°F | Resp 18 | Ht 65.0 in | Wt 200.5 lb

## 2015-09-10 DIAGNOSIS — E7800 Pure hypercholesterolemia, unspecified: Secondary | ICD-10-CM

## 2015-09-10 DIAGNOSIS — M549 Dorsalgia, unspecified: Secondary | ICD-10-CM

## 2015-09-10 DIAGNOSIS — I10 Essential (primary) hypertension: Secondary | ICD-10-CM

## 2015-09-10 DIAGNOSIS — G8929 Other chronic pain: Secondary | ICD-10-CM

## 2015-09-10 DIAGNOSIS — F32A Depression, unspecified: Secondary | ICD-10-CM

## 2015-09-10 DIAGNOSIS — F329 Major depressive disorder, single episode, unspecified: Secondary | ICD-10-CM | POA: Diagnosis not present

## 2015-09-10 DIAGNOSIS — F419 Anxiety disorder, unspecified: Secondary | ICD-10-CM

## 2015-09-10 MED ORDER — ALPRAZOLAM 1 MG PO TABS: 1.0000 mg | ORAL_TABLET | Freq: Two times a day (BID) | ORAL | Status: DC | PRN

## 2015-09-10 NOTE — Progress Notes (Signed)
Patient ID: Angel Riddle, female   DOB: 29-Dec-1955, 59 y.o.   MRN: [id number]   Subjective:    Patient ID: Angel Riddle, female    DOB: [birth year], 59 y.o.   MRN: [id number]  HPI  Patient with past history of hypertension, depression and stress and hypercholesterolemia.  She comes in today to follow up on these issues.  She is doing better.  Her son moved out.  She is still under increased stress with family issues.  She cancelled her appt with psychiatry.  Discussed referral to psychiatry and to a counselor.  Gave her information to a Social worker.  Eating and drinking.  Discussed diet and exercise.  No chest pain or tightness.  No sob.  No abdominal pain or cramping.  Bowels stable.     Past Medical History  Diagnosis Date  . Arthritis   . Depression   . Allergy   . Heart murmur   . Hypercholesterolemia   . Hx: UTI (urinary tract infection)   . Panic attacks     H/O  . Hx of colonic polyp   . Anxiety   . Chronic back pain     degenerative disc disease  . Gallstones    Past Surgical History  Procedure Laterality Date  . Abdominal hysterectomy  1992    partial  . Tonsillectomy and adenoidectomy  1969  . Skin surgery Left 08/27/13    Basal cell removal-left nostril  . Foot surgery Right 2009  . Cataract extraction Bilateral [id number]  . Cholecystectomy  04-19-14  . Colonoscopy  07/02/11    DR. Byrnett   Family History  Problem Relation Age of Onset  . Hyperlipidemia Mother   . Hypertension Mother   . Alcohol abuse Father   . Hyperlipidemia Father   . Heart disease Father   . Diabetes Father   . Hyperlipidemia Sister   . Lung cancer Brother   . Hyperlipidemia Brother    Social History   Social History  . Marital Status: Married    Spouse Name: N/A  . Number of Children: 2  . Years of Education: N/A   Social History Main Topics  . Smoking status: Former Research scientist (life sciences)  . Smokeless tobacco: Never Used  . Alcohol Use: 0.0 oz/week    0 Standard drinks or equivalent  per week  . Drug Use: No  . Sexual Activity: Not Asked   Other Topics Concern  . None   Social History Narrative    Outpatient Encounter Prescriptions as of 09/10/2015  Medication Sig  . ALPRAZolam (XANAX) 1 MG tablet Take 1 tablet (1 mg total) by mouth 2 (two) times daily as needed for sleep.  . cefdinir (OMNICEF) 300 MG capsule Take 1 capsule (300 mg total) by mouth 2 (two) times daily. (Patient not taking: Reported on 09/05/2015)  . FLUoxetine (PROZAC) 40 MG capsule Take 1 capsule (40 mg total) by mouth daily.  . fluticasone (FLONASE) 50 MCG/ACT nasal spray Place 1 spray into both nostrils.  Marland Kitchen gabapentin (NEURONTIN) 100 MG capsule Take 200 mg by mouth at bedtime.  . hydrochlorothiazide (HYDRODIURIL) 25 MG tablet Take 0.5 tablets (12.5 mg total) by mouth daily.  Marland Kitchen HYDROcodone-acetaminophen (NORCO/VICODIN) 5-325 MG per tablet Take 5-325 tablets by mouth 2 (two) times daily as needed.  . meloxicam (MOBIC) 15 MG tablet Take 1 tablet (15 mg total) by mouth daily.  . ondansetron (ZOFRAN) 4 MG tablet Take 1 tablet (4 mg total) by mouth 2 (two) times daily as needed for  nausea or vomiting.  . pantoprazole (PROTONIX) 40 MG tablet Take 1 tablet (40 mg total) by mouth daily.  . pravastatin (PRAVACHOL) 10 MG tablet Take 1 tablet (10 mg total) by mouth daily.  . predniSONE (DELTASONE) 10 MG tablet Take 6 tablets x 1 day and then decrease by 1/2 tablet per day until down to zero mg.  . Pseudoeph-Doxylamine-DM-APAP (NYQUIL PO) Take by mouth.  . Pseudoephedrine HCl 240 MG TB24 Take by mouth as needed.  . [DISCONTINUED] ALPRAZolam (XANAX) 1 MG tablet Take 1 tablet (1 mg total) by mouth 2 (two) times daily as needed for sleep.   No facility-administered encounter medications on file as of 09/10/2015.    Review of Systems  Constitutional: Negative for appetite change and unexpected weight change.  HENT: Negative for congestion and sinus pressure.   Eyes: Negative for pain and visual disturbance.    Respiratory: Negative for cough, chest tightness and shortness of breath.   Cardiovascular: Negative for chest pain, palpitations and leg swelling.  Gastrointestinal: Negative for nausea, vomiting, abdominal pain and diarrhea.  Genitourinary: Negative for dysuria and difficulty urinating.  Musculoskeletal: Positive for back pain (chronic back pain). Negative for joint swelling.  Skin: Negative for color change and rash.  Neurological: Negative for dizziness, light-headedness and headaches.  Psychiatric/Behavioral:       Still with increased stress.  Does feel better since her son moved out.         Objective:    Physical Exam  Constitutional: She appears well-developed and well-nourished. No distress.  HENT:  Nose: Nose normal.  Mouth/Throat: Oropharynx is clear and moist.  Eyes: Conjunctivae are normal. Right eye exhibits no discharge. Left eye exhibits no discharge.  Neck: Neck supple. No thyromegaly present.  Cardiovascular: Normal rate and regular rhythm.   Pulmonary/Chest: Breath sounds normal. No respiratory distress. She has no wheezes.  Abdominal: Soft. Bowel sounds are normal. There is no tenderness.  Musculoskeletal: She exhibits no edema or tenderness.  Lymphadenopathy:    She has no cervical adenopathy.  Skin: No rash noted. No erythema.  Psychiatric: She has a normal mood and affect. Her behavior is normal.    BP 110/70 mmHg  Pulse 108  Temp(Src) 98.1 F (36.7 C) (Oral)  Resp 18  Ht 5\' 5"  (1.651 m)  Wt 200 lb 8 oz (90.946 kg)  BMI 33.36 kg/m2  SpO2 95% Wt Readings from Last 3 Encounters:  09/10/15 200 lb 8 oz (90.946 kg)  07/30/15 202 lb 8 oz (91.853 kg)  05/19/15 203 lb (92.08 kg)     Lab Results  Component Value Date   WBC 7.1 05/15/2015   HGB 14.1 05/15/2015   HCT 41.4 05/15/2015   PLT 297.0 05/15/2015   GLUCOSE 88 05/15/2015   CHOL 263* 05/15/2015   TRIG 380.0* 05/15/2015   HDL 45.10 05/15/2015   LDLDIRECT 147.0 05/15/2015   LDLCALC 218*  03/14/2014   ALT 9 05/15/2015   AST 18 05/15/2015   NA 137 05/15/2015   K 3.7 05/15/2015   CL 101 05/15/2015   CREATININE 0.88 05/15/2015   BUN 15 05/15/2015   CO2 31 05/15/2015   TSH 1.41 05/15/2015       Assessment & Plan:   Problem List Items Addressed This Visit    Anxiety    She cancelled her psychiatry appt.  On prozac.  Has xanax to take prn.  Discussed counseling.  Information given.  Follow.        Relevant Medications   ALPRAZolam (  XANAX) 1 MG tablet   Chronic back pain    Followed by Dr Sharlet Salina.        Depression    On prozac.  Does feel she is doing better since her son moved.  Still with family stressors.  She cancelled her psychiatry appt.  Discussed rescheduling.  Also gave her info for counselor.  Follow.        Relevant Medications   ALPRAZolam (XANAX) 1 MG tablet   Hypercholesterolemia    Low cholesterol diet and exercise.  Follow lipid panel and liver function tests.  On pravastatin.        Relevant Orders   Lipid panel   Hypertension - Primary    Blood pressure has been under good control.  Have her spot check her pressure.  Follow pressures.  Same medication regimen.        Relevant Orders   Hepatic function panel   Basic metabolic panel       Einar Pheasant, MD

## 2015-09-10 NOTE — Progress Notes (Signed)
Pre-visit discussion using our clinic review tool. No additional management support is needed unless otherwise documented below in the visit note.  

## 2015-09-14 ENCOUNTER — Encounter: Payer: Self-pay | Admitting: Internal Medicine

## 2015-09-14 NOTE — Assessment & Plan Note (Signed)
Low cholesterol diet and exercise.  Follow lipid panel and liver function tests.  On pravastatin.   

## 2015-09-14 NOTE — Assessment & Plan Note (Signed)
She cancelled her psychiatry appt.  On prozac.  Has xanax to take prn.  Discussed counseling.  Information given.  Follow.

## 2015-09-14 NOTE — Assessment & Plan Note (Signed)
On prozac.  Does feel she is doing better since her son moved.  Still with family stressors.  She cancelled her psychiatry appt.  Discussed rescheduling.  Also gave her info for counselor.  Follow.

## 2015-09-14 NOTE — Assessment & Plan Note (Signed)
Blood pressure has been under good control.  Have her spot check her pressure.  Follow pressures.  Same medication regimen.

## 2015-09-14 NOTE — Assessment & Plan Note (Signed)
Followed by Dr Chasnis.   

## 2015-09-18 ENCOUNTER — Encounter: Payer: Self-pay | Admitting: *Deleted

## 2015-09-19 ENCOUNTER — Other Ambulatory Visit: Payer: Self-pay

## 2015-09-19 MED ORDER — MELOXICAM 15 MG PO TABS: 15.0000 mg | ORAL_TABLET | Freq: Every day | ORAL | Status: DC

## 2015-09-25 ENCOUNTER — Other Ambulatory Visit: Payer: Self-pay | Admitting: Gastroenterology

## 2015-09-25 ENCOUNTER — Ambulatory Visit
Admission: RE | Admit: 2015-09-25 | Discharge: 2015-09-25 | Disposition: A | Discharge status: Home / Self Care | Payer: 59 | Source: Ambulatory Visit | Attending: Gastroenterology | Admitting: Gastroenterology

## 2015-09-25 ENCOUNTER — Ambulatory Visit: Payer: 59 | Admitting: Anesthesiology

## 2015-09-25 ENCOUNTER — Encounter
Admission: RE | Disposition: A | Discharge status: Home / Self Care | Payer: Self-pay | Source: Ambulatory Visit | Attending: Gastroenterology

## 2015-09-25 DIAGNOSIS — G8929 Other chronic pain: Secondary | ICD-10-CM | POA: Insufficient documentation

## 2015-09-25 DIAGNOSIS — Z7951 Long term (current) use of inhaled steroids: Secondary | ICD-10-CM | POA: Diagnosis not present

## 2015-09-25 DIAGNOSIS — I10 Essential (primary) hypertension: Secondary | ICD-10-CM | POA: Insufficient documentation

## 2015-09-25 DIAGNOSIS — Z791 Long term (current) use of non-steroidal anti-inflammatories (NSAID): Secondary | ICD-10-CM | POA: Diagnosis not present

## 2015-09-25 DIAGNOSIS — M549 Dorsalgia, unspecified: Secondary | ICD-10-CM | POA: Diagnosis not present

## 2015-09-25 DIAGNOSIS — Z7952 Long term (current) use of systemic steroids: Secondary | ICD-10-CM | POA: Insufficient documentation

## 2015-09-25 DIAGNOSIS — Z9049 Acquired absence of other specified parts of digestive tract: Secondary | ICD-10-CM | POA: Diagnosis not present

## 2015-09-25 DIAGNOSIS — Z79891 Long term (current) use of opiate analgesic: Secondary | ICD-10-CM | POA: Insufficient documentation

## 2015-09-25 DIAGNOSIS — K640 First degree hemorrhoids: Secondary | ICD-10-CM | POA: Diagnosis not present

## 2015-09-25 DIAGNOSIS — Z9841 Cataract extraction status, right eye: Secondary | ICD-10-CM | POA: Insufficient documentation

## 2015-09-25 DIAGNOSIS — E7800 Pure hypercholesterolemia, unspecified: Secondary | ICD-10-CM | POA: Insufficient documentation

## 2015-09-25 DIAGNOSIS — D125 Benign neoplasm of sigmoid colon: Secondary | ICD-10-CM | POA: Diagnosis not present

## 2015-09-25 DIAGNOSIS — Z79899 Other long term (current) drug therapy: Secondary | ICD-10-CM | POA: Diagnosis not present

## 2015-09-25 DIAGNOSIS — Z9889 Other specified postprocedural states: Secondary | ICD-10-CM | POA: Diagnosis not present

## 2015-09-25 DIAGNOSIS — Z90710 Acquired absence of both cervix and uterus: Secondary | ICD-10-CM | POA: Diagnosis not present

## 2015-09-25 DIAGNOSIS — F329 Major depressive disorder, single episode, unspecified: Secondary | ICD-10-CM | POA: Diagnosis not present

## 2015-09-25 DIAGNOSIS — K219 Gastro-esophageal reflux disease without esophagitis: Secondary | ICD-10-CM | POA: Diagnosis not present

## 2015-09-25 DIAGNOSIS — F419 Anxiety disorder, unspecified: Secondary | ICD-10-CM | POA: Diagnosis not present

## 2015-09-25 DIAGNOSIS — K602 Anal fissure, unspecified: Secondary | ICD-10-CM | POA: Insufficient documentation

## 2015-09-25 DIAGNOSIS — Z860100 Personal history of colon polyps, unspecified: Secondary | ICD-10-CM | POA: Insufficient documentation

## 2015-09-25 DIAGNOSIS — Z87891 Personal history of nicotine dependence: Secondary | ICD-10-CM | POA: Insufficient documentation

## 2015-09-25 DIAGNOSIS — K635 Polyp of colon: Secondary | ICD-10-CM | POA: Diagnosis not present

## 2015-09-25 DIAGNOSIS — M5136 Other intervertebral disc degeneration, lumbar region: Secondary | ICD-10-CM | POA: Diagnosis not present

## 2015-09-25 DIAGNOSIS — Z85828 Personal history of other malignant neoplasm of skin: Secondary | ICD-10-CM | POA: Insufficient documentation

## 2015-09-25 HISTORY — DX: Essential (primary) hypertension: I10

## 2015-09-25 HISTORY — PX: POLYPECTOMY: SHX5525

## 2015-09-25 HISTORY — DX: Gastro-esophageal reflux disease without esophagitis: K21.9

## 2015-09-25 HISTORY — DX: Other intervertebral disc degeneration, lumbar region without mention of lumbar back pain or lower extremity pain: M51.369

## 2015-09-25 HISTORY — DX: Other intervertebral disc degeneration, lumbar region: M51.36

## 2015-09-25 HISTORY — PX: COLONOSCOPY WITH PROPOFOL: SHX5780

## 2015-09-25 HISTORY — DX: Presence of dental prosthetic device (complete) (partial): Z97.2

## 2015-09-25 SURGERY — COLONOSCOPY WITH PROPOFOL
Anesthesia: Monitor Anesthesia Care | Wound class: Contaminated

## 2015-09-25 MED ORDER — OXYCODONE HCL 5 MG/5ML PO SOLN: 5.0000 mg | Freq: Once | ORAL | Status: DC | PRN

## 2015-09-25 MED ORDER — LACTATED RINGERS IV SOLN
INTRAVENOUS | Status: DC
  Administered 2015-09-25: 10:00:00 via INTRAVENOUS

## 2015-09-25 MED ORDER — OXYCODONE HCL 5 MG PO TABS: 5.0000 mg | ORAL_TABLET | Freq: Once | ORAL | Status: DC | PRN

## 2015-09-25 MED ORDER — FENTANYL CITRATE (PF) 100 MCG/2ML IJ SOLN: 25.0000 ug | INTRAMUSCULAR | Status: DC | PRN

## 2015-09-25 MED ORDER — DEXAMETHASONE SODIUM PHOSPHATE 4 MG/ML IJ SOLN: 8.0000 mg | Freq: Once | INTRAMUSCULAR | Status: DC | PRN

## 2015-09-25 MED ORDER — STERILE WATER FOR IRRIGATION IR SOLN
Status: DC | PRN
  Administered 2015-09-25: 11:00:00

## 2015-09-25 MED ORDER — ACETAMINOPHEN 325 MG PO TABS
325.0000 mg | ORAL_TABLET | ORAL | Status: DC | PRN
Start: 2015-09-25 — End: 2015-09-25

## 2015-09-25 MED ORDER — ACETAMINOPHEN 160 MG/5ML PO SOLN: 325.0000 mg | ORAL | Status: DC | PRN

## 2015-09-25 MED ORDER — PROPOFOL 10 MG/ML IV BOLUS
INTRAVENOUS | Status: DC | PRN
  Administered 2015-09-25: 60 mg via INTRAVENOUS
  Administered 2015-09-25 (×2): 20 mg via INTRAVENOUS
  Administered 2015-09-25 (×2): 30 mg via INTRAVENOUS
  Administered 2015-09-25: 10 mg via INTRAVENOUS

## 2015-09-25 MED ORDER — LACTATED RINGERS IV SOLN: 500.0000 mL | INTRAVENOUS | Status: DC

## 2015-09-25 MED ORDER — LIDOCAINE HCL (CARDIAC) 20 MG/ML IV SOLN
INTRAVENOUS | Status: DC | PRN
  Administered 2015-09-25: 50 mg via INTRAVENOUS

## 2015-09-25 SURGICAL SUPPLY — 28 items
CANISTER SUCT 1200ML W/VALVE (MISCELLANEOUS) ×4 IMPLANT
FCP ESCP3.2XJMB 240X2.8X (MISCELLANEOUS)
FORCEPS BIOP RAD 4 LRG CAP 4 (CUTTING FORCEPS) ×4 IMPLANT
FORCEPS BIOP RJ4 240 W/NDL (MISCELLANEOUS)
FORCEPS ESCP3.2XJMB 240X2.8X (MISCELLANEOUS) IMPLANT
GOWN CVR UNV OPN BCK APRN NK (MISCELLANEOUS) ×4 IMPLANT
GOWN ISOL THUMB LOOP REG UNIV (MISCELLANEOUS) ×4
HEMOCLIP INSTINCT (CLIP) IMPLANT
INJECTOR VARIJECT VIN23 (MISCELLANEOUS) IMPLANT
KIT CO2 TUBING (TUBING) IMPLANT
KIT DEFENDO VALVE AND CONN (KITS) IMPLANT
KIT ENDO PROCEDURE OLY (KITS) ×4 IMPLANT
LIGATOR MULTIBAND 6SHOOTER MBL (MISCELLANEOUS) IMPLANT
MARKER SPOT ENDO TATTOO 5ML (MISCELLANEOUS) IMPLANT
PAD GROUND ADULT SPLIT (MISCELLANEOUS) IMPLANT
SNARE SHORT THROW 13M SML OVAL (MISCELLANEOUS) IMPLANT
SNARE SHORT THROW 30M LRG OVAL (MISCELLANEOUS) IMPLANT
SPOT EX ENDOSCOPIC TATTOO (MISCELLANEOUS)
SUCTION POLY TRAP 4CHAMBER (MISCELLANEOUS) IMPLANT
TRAP SUCTION POLY (MISCELLANEOUS) IMPLANT
TUBING CONN 6MMX3.1M (TUBING)
TUBING SUCTION CONN 0.25 STRL (TUBING) IMPLANT
UNDERPAD 30X60 958B10 (PK) (MISCELLANEOUS) IMPLANT
VALVE BIOPSY ENDO (VALVE) IMPLANT
VARIJECT INJECTOR VIN23 (MISCELLANEOUS)
WATER AUXILLARY (MISCELLANEOUS) IMPLANT
WATER STERILE IRR 250ML POUR (IV SOLUTION) ×4 IMPLANT
WATER STERILE IRR 500ML POUR (IV SOLUTION) IMPLANT

## 2015-09-25 NOTE — Anesthesia Procedure Notes (Signed)
Procedure Name: MAC Performed by: Lucero Ide Pre-anesthesia Checklist: Patient identified, Emergency Drugs available, Suction available, Timeout performed and Patient being monitored Patient Re-evaluated:Patient Re-evaluated prior to inductionOxygen Delivery Method: Nasal cannula Placement Confirmation: positive ETCO2       

## 2015-09-25 NOTE — Transfer of Care (Signed)
Immediate Anesthesia Transfer of Care Note  Patient: Angel Riddle  Procedure(s) Performed: Procedure(s): COLONOSCOPY WITH PROPOFOL (N/A)  Patient Location: PACU  Anesthesia Type: MAC  Level of Consciousness: awake, alert  and patient cooperative  Airway and Oxygen Therapy: Patient Spontanous Breathing and Patient connected to supplemental oxygen  Post-op Assessment: Post-op Vital signs reviewed, Patient's Cardiovascular Status Stable, Respiratory Function Stable, Patent Airway and No signs of Nausea or vomiting  Post-op Vital Signs: Reviewed and stable  Complications: No apparent anesthesia complications

## 2015-09-25 NOTE — Anesthesia Preprocedure Evaluation (Signed)
Anesthesia Evaluation  Patient identified by MRN, date of birth, ID band Patient awake    Reviewed: Allergy & Precautions, H&P , NPO status , Patient's Chart, lab work & pertinent test results, reviewed documented beta blocker date and time   Airway Mallampati: II  TM Distance: >3 FB Neck ROM: full    Dental no notable dental hx.    Pulmonary former smoker,    Pulmonary exam normal breath sounds clear to auscultation       Cardiovascular Exercise Tolerance: Good hypertension,  Rhythm:regular Rate:Normal     Neuro/Psych negative neurological ROS  negative psych ROS   GI/Hepatic Neg liver ROS, GERD  Medicated,  Endo/Other  negative endocrine ROS  Renal/GU negative Renal ROS  negative genitourinary   Musculoskeletal   Abdominal   Peds  Hematology negative hematology ROS (+)   Anesthesia Other Findings   Reproductive/Obstetrics negative OB ROS                             Anesthesia Physical Anesthesia Plan  ASA: II  Anesthesia Plan: MAC   Post-op Pain Management:    Induction:   Airway Management Planned:   Additional Equipment:   Intra-op Plan:   Post-operative Plan:   Informed Consent: I have reviewed the patients History and Physical, chart, labs and discussed the procedure including the risks, benefits and alternatives for the proposed anesthesia with the patient or authorized representative who has indicated his/her understanding and acceptance.     Plan Discussed with: CRNA  Anesthesia Plan Comments:         Anesthesia Quick Evaluation

## 2015-09-25 NOTE — Anesthesia Postprocedure Evaluation (Signed)
  Anesthesia Post-op Note  Patient: Angel Riddle  Procedure(s) Performed: Procedure(s): COLONOSCOPY WITH PROPOFOL (N/A)  Anesthesia type:MAC  Patient location: PACU  Post pain: Pain level controlled  Post assessment: Post-op Vital signs reviewed, Patient's Cardiovascular Status Stable, Respiratory Function Stable, Patent Airway and No signs of Nausea or vomiting  Post vital signs: Reviewed and stable  Last Vitals:  Filed Vitals:   09/25/15 1129  BP:   Pulse: 80  Temp: 36.3 C  Resp: 16    Level of consciousness: awake, alert  and patient cooperative  Complications: No apparent anesthesia complications

## 2015-09-25 NOTE — H&P (Signed)
[facility]  [address] [place], [place] [address] Phone: [phone] Fax : [phone]  Primary Care Physician:  Einar Pheasant, MD Primary Gastroenterologist:  Dr. Allen Norris  Pre-Procedure History & Physical: HPI:  Angel Riddle is a 59 y.o. female is here for an colonoscopy.   Past Medical History  Diagnosis Date  . Arthritis   . Depression   . Allergy   . Hypercholesterolemia   . Hx: UTI (urinary tract infection)   . Hx of colonic polyp   . Chronic back pain     degenerative disc disease  . Gallstones   . Hypertension   . Panic attacks     H/O  . Anxiety   . GERD (gastroesophageal reflux disease)   . Degenerative disc disease, lumbar   . Heart murmur     followed by PCP  . Wears dentures     partial upper and lower    Past Surgical History  Procedure Laterality Date  . Abdominal hysterectomy  1992    partial  . Tonsillectomy and adenoidectomy  1969  . Skin surgery Left 08/27/13    Basal cell removal-left nostril  . Foot surgery Right 2009  . Cataract extraction Bilateral [id number]  . Cholecystectomy  04-19-14  . Colonoscopy  07/02/11    DR. Byrnett  . Mouth surgery      Prior to Admission medications   Medication Sig Start Date End Date Taking? Authorizing Provider  ALPRAZolam Duanne Moron) 1 MG tablet Take 1 tablet (1 mg total) by mouth 2 (two) times daily as needed for sleep. 09/10/15  Yes Einar Pheasant, MD  FLUoxetine (PROZAC) 40 MG capsule Take 1 capsule (40 mg total) by mouth daily. 07/29/15  Yes Einar Pheasant, MD  fluticasone (FLONASE) 50 MCG/ACT nasal spray Place 1 spray into both nostrils. 12/24/13  Yes Historical Provider, MD  gabapentin (NEURONTIN) 100 MG capsule Take 200 mg by mouth at bedtime. 03/03/15  Yes Historical Provider, MD  hydrochlorothiazide (HYDRODIURIL) 25 MG tablet Take 0.5 tablets (12.5 mg total) by mouth daily. 06/18/15  Yes Einar Pheasant, MD  HYDROcodone-acetaminophen (NORCO/VICODIN) 5-325 MG per tablet Take  5-325 tablets by mouth 2 (two) times daily as needed. 04/22/15  Yes Historical Provider, MD  meloxicam (MOBIC) 15 MG tablet Take 1 tablet (15 mg total) by mouth daily. 09/19/15  Yes Einar Pheasant, MD  ondansetron (ZOFRAN) 4 MG tablet Take 1 tablet (4 mg total) by mouth 2 (two) times daily as needed for nausea or vomiting. 07/17/15  Yes Einar Pheasant, MD  pantoprazole (PROTONIX) 40 MG tablet Take 1 tablet (40 mg total) by mouth daily. 07/30/15  Yes Einar Pheasant, MD  pravastatin (PRAVACHOL) 10 MG tablet Take 1 tablet (10 mg total) by mouth daily. 03/25/15  Yes Einar Pheasant, MD  Pseudoephedrine HCl 240 MG TB24 Take by mouth as needed.   Yes Historical Provider, MD  ranitidine (ZANTAC) 75 MG tablet Take 75 mg by mouth daily.   Yes Historical Provider, MD  cefdinir (OMNICEF) 300 MG capsule Take 1 capsule (300 mg total) by mouth 2 (two) times daily. Patient not taking: Reported on 09/05/2015 07/31/15   Einar Pheasant, MD  predniSONE (DELTASONE) 10 MG tablet Take 6 tablets x 1 day and then decrease by 1/2 tablet per day until down to zero mg. 07/31/15   Einar Pheasant, MD  Pseudoeph-Doxylamine-DM-APAP (NYQUIL PO) Take by mouth.    Historical Provider, MD    Allergies as of 09/05/2015 - Review Complete 09/05/2015  Allergen Reaction Noted  .  Levaquin [levofloxacin in d5w]  12/24/2013  . Ivp dye [iodinated diagnostic agents]  07/16/2013    Family History  Problem Relation Age of Onset  . Hyperlipidemia Mother   . Hypertension Mother   . Alcohol abuse Father   . Hyperlipidemia Father   . Heart disease Father   . Diabetes Father   . Hyperlipidemia Sister   . Lung cancer Brother   . Hyperlipidemia Brother     Social History   Social History  . Marital Status: Married    Spouse Name: N/A  . Number of Children: 2  . Years of Education: N/A   Occupational History  . Not on file.   Social History Main Topics  . Smoking status: Former Smoker    Quit date: 12/23/2011  . Smokeless tobacco:  Never Used  . Alcohol Use: 0.6 oz/week    0 Standard drinks or equivalent, 1 Glasses of wine per week  . Drug Use: No  . Sexual Activity: Not on file   Other Topics Concern  . Not on file   Social History Narrative    Review of Systems: See HPI, otherwise negative ROS  Physical Exam: BP 123/66 mmHg  Pulse 80  Temp(Src) 98.1 F (36.7 C) (Temporal)  Resp 16  Ht 5\' 5"  (1.651 m)  Wt 197 lb (89.359 kg)  BMI 32.78 kg/m2  SpO2 99% General:   Alert,  pleasant and cooperative in NAD Head:  Normocephalic and atraumatic. Neck:  Supple; no masses or thyromegaly. Lungs:  Clear throughout to auscultation.    Heart:  Regular rate and rhythm. Abdomen:  Soft, nontender and nondistended. Normal bowel sounds, without guarding, and without rebound.   Neurologic:  Alert and  oriented x4;  grossly normal neurologically.  Impression/Plan: Angel Riddle is here for an colonoscopy to be performed for history of colon poylps  Risks, benefits, limitations, and alternatives regarding  colonoscopy have been reviewed with the patient.  Questions have been answered.  All parties agreeable.   Ollen Bowl, MD  09/25/2015, 10:36 AM

## 2015-09-25 NOTE — Op Note (Signed)
[facility]astroenterology Patient Name: Angel Riddle Procedure Date: 09/25/2015 11:02 AM MRN: [id number] Account #: [id number] Date of Birth: 25-Nov-1956 Admit Type: Outpatient Age: 59 Room: [facility] OR ROOM 01 Gender: Female Note Status: Finalized Procedure:         Colonoscopy Indications:       High risk colon cancer surveillance: Personal history of                     colonic polyps Providers:         Lucilla Lame, MD Referring MD:      Einar Pheasant, MD (Referring MD) Medicines:         Propofol per Anesthesia Complications:     No immediate complications. Procedure:         Pre-Anesthesia Assessment:                    - Prior to the procedure, a History and Physical was                     performed, and patient medications and allergies were                     reviewed. The patient's tolerance of previous anesthesia                     was also reviewed. The risks and benefits of the procedure                     and the sedation options and risks were discussed with the                     patient. All questions were answered, and informed consent                     was obtained. Prior Anticoagulants: The patient has taken                     no previous anticoagulant or antiplatelet agents. ASA                     Grade Assessment: II - A patient with mild systemic                     disease. After reviewing the risks and benefits, the                     patient was deemed in satisfactory condition to undergo                     the procedure.                    After obtaining informed consent, the colonoscope was                     passed under direct vision. Throughout the procedure, the                     patient's blood pressure, pulse, and oxygen saturations                     were monitored continuously. The [place]  colonoscope (S#: [id number]) was introduced through the anus                     and advanced to  the the cecum, identified by appendiceal                     orifice and ileocecal valve. The colonoscopy was performed                     without difficulty. The patient tolerated the procedure                     well. The quality of the bowel preparation was excellent. Findings:      The perianal exam findings include anal fissure.      A 3 mm polyp was found in the sigmoid colon. The polyp was sessile. The       polyp was removed with a cold biopsy forceps. Resection and retrieval       were complete.      Non-bleeding internal hemorrhoids were found during retroflexion. The       hemorrhoids were Grade I (internal hemorrhoids that do not prolapse). Impression:        - Anal fissure found on perianal exam.                    - One 3 mm polyp in the sigmoid colon. Resected and                     retrieved.                    - Non-bleeding internal hemorrhoids. Recommendation:    - Await pathology results.                    - Repeat colonoscopy in 5 years for surveillance. Procedure Code(s): --- Professional ---                    [phone], Colonoscopy, flexible; with biopsy, single or                     multiple Diagnosis Code(s): --- Professional ---                    Z86.010, Personal history of colonic polyps                    K60.2, Anal fissure, unspecified                    D12.5, Benign neoplasm of sigmoid colon CPT copyright 2014 American Medical Association. All rights reserved. The codes documented in this report are preliminary and upon coder review may  be revised to meet current compliance requirements. Lucilla Lame, MD 09/25/2015 11:28:25 AM This report has been signed electronically. Number of Addenda: 0 Note Initiated On: 09/25/2015 11:02 AM Scope Withdrawal Time: 0 hours 8 minutes 57 seconds  Total Procedure Duration: 0 hours 11 minutes 31 seconds       [facility]

## 2015-09-25 NOTE — Discharge Instructions (Signed)

## 2015-09-26 ENCOUNTER — Encounter: Payer: Self-pay | Admitting: Gastroenterology

## 2015-09-29 ENCOUNTER — Encounter: Payer: Self-pay | Admitting: Gastroenterology

## 2015-10-02 ENCOUNTER — Other Ambulatory Visit: Payer: 59

## 2015-10-14 ENCOUNTER — Ambulatory Visit
Admission: RE | Admit: 2015-10-14 | Discharge: 2015-10-14 | Disposition: A | Discharge status: Home / Self Care | Payer: 59 | Source: Ambulatory Visit | Attending: Internal Medicine | Admitting: Internal Medicine

## 2015-10-14 DIAGNOSIS — Z1239 Encounter for other screening for malignant neoplasm of breast: Secondary | ICD-10-CM

## 2015-10-28 ENCOUNTER — Other Ambulatory Visit: Payer: Self-pay

## 2015-10-28 MED ORDER — FLUOXETINE HCL 40 MG PO CAPS: 40.0000 mg | ORAL_CAPSULE | Freq: Every day | ORAL | Status: DC

## 2015-11-18 ENCOUNTER — Other Ambulatory Visit: Payer: Self-pay

## 2015-11-18 MED ORDER — PRAVASTATIN SODIUM 10 MG PO TABS: 10.0000 mg | ORAL_TABLET | Freq: Every day | ORAL | Status: DC

## 2015-11-20 ENCOUNTER — Other Ambulatory Visit: Payer: 59

## 2015-11-21 ENCOUNTER — Other Ambulatory Visit (INDEPENDENT_AMBULATORY_CARE_PROVIDER_SITE_OTHER): Payer: 59

## 2015-11-21 ENCOUNTER — Telehealth: Payer: Self-pay | Admitting: *Deleted

## 2015-11-21 DIAGNOSIS — I10 Essential (primary) hypertension: Secondary | ICD-10-CM | POA: Diagnosis not present

## 2015-11-21 DIAGNOSIS — R3911 Hesitancy of micturition: Secondary | ICD-10-CM

## 2015-11-21 DIAGNOSIS — E7800 Pure hypercholesterolemia, unspecified: Secondary | ICD-10-CM

## 2015-11-21 LAB — HEPATIC FUNCTION PANEL
ALT: 8 U/L (ref 0–35)
AST: 17 U/L (ref 0–37)
Albumin: 4.3 g/dL (ref 3.5–5.2)
Alkaline Phosphatase: 83 U/L (ref 39–117)
BILIRUBIN DIRECT: 0.1 mg/dL (ref 0.0–0.3)
BILIRUBIN TOTAL: 0.5 mg/dL (ref 0.2–1.2)
Total Protein: 6.8 g/dL (ref 6.0–8.3)

## 2015-11-21 LAB — BASIC METABOLIC PANEL
BUN: 13 mg/dL (ref 6–23)
CO2: 27 meq/L (ref 19–32)
Calcium: 9.5 mg/dL (ref 8.4–10.5)
Chloride: 104 mEq/L (ref 96–112)
Creatinine, Ser: 0.82 mg/dL (ref 0.40–1.20)
GFR: 75.63 mL/min (ref >60.00)
GLUCOSE: 101 mg/dL — AB (ref 70–99)
POTASSIUM: 3.9 meq/L (ref 3.5–5.1)
Sodium: 140 mEq/L (ref 135–145)

## 2015-11-21 LAB — URINALYSIS, ROUTINE W REFLEX MICROSCOPIC
BILIRUBIN URINE: NEGATIVE
HGB URINE DIPSTICK: NEGATIVE
Ketones, ur: NEGATIVE
Leukocytes, UA: NEGATIVE
NITRITE: NEGATIVE
PH: 7 (ref 5.0–8.0)
RBC / HPF: NONE SEEN (ref 0–?)
Specific Gravity, Urine: 1.02 (ref 1.000–1.030)
TOTAL PROTEIN, URINE-UPE24: NEGATIVE
URINE GLUCOSE: NEGATIVE
UROBILINOGEN UA: 0.2 (ref 0.0–1.0)
WBC UA: NONE SEEN (ref 0–?)

## 2015-11-21 LAB — LIPID PANEL
Cholesterol: 241 mg/dL — ABNORMAL HIGH (ref 0–200)
HDL: 44.6 mg/dL (ref >39.00)
NONHDL: 196.53
Total CHOL/HDL Ratio: 5
Triglycerides: 256 mg/dL — ABNORMAL HIGH (ref 0.0–149.0)
VLDL: 51.2 mg/dL — AB (ref 0.0–40.0)

## 2015-11-21 LAB — LDL CHOLESTEROL, DIRECT: Direct LDL: 153 mg/dL

## 2015-11-21 MED ORDER — FIRST-DUKES MOUTHWASH MT SUSP: OROMUCOSAL | Status: DC

## 2015-11-21 NOTE — Telephone Encounter (Signed)
Pt has a mouth ulcer on upper part of mouth x 1 week. Would like to know if you would be willing to send in a mouthwash until she see you at her upcoming appt. Please advise.

## 2015-11-21 NOTE — Telephone Encounter (Signed)
Please notify pt that I sent in [facility] mouthwash.  Let us know if persistent problems.

## 2015-11-21 NOTE — Telephone Encounter (Signed)
Pt.notified

## 2015-11-25 ENCOUNTER — Encounter: Payer: Self-pay | Admitting: Internal Medicine

## 2015-11-25 ENCOUNTER — Ambulatory Visit (INDEPENDENT_AMBULATORY_CARE_PROVIDER_SITE_OTHER): Payer: 59 | Admitting: Internal Medicine

## 2015-11-25 VITALS — BP 128/86 | HR 77 | Temp 98.9°F | Ht 65.0 in | Wt 196.2 lb

## 2015-11-25 DIAGNOSIS — F32A Depression, unspecified: Secondary | ICD-10-CM

## 2015-11-25 DIAGNOSIS — I10 Essential (primary) hypertension: Secondary | ICD-10-CM

## 2015-11-25 DIAGNOSIS — K602 Anal fissure, unspecified: Secondary | ICD-10-CM

## 2015-11-25 DIAGNOSIS — E538 Deficiency of other specified B group vitamins: Secondary | ICD-10-CM | POA: Diagnosis not present

## 2015-11-25 DIAGNOSIS — G8929 Other chronic pain: Secondary | ICD-10-CM

## 2015-11-25 DIAGNOSIS — M549 Dorsalgia, unspecified: Secondary | ICD-10-CM

## 2015-11-25 DIAGNOSIS — E7800 Pure hypercholesterolemia, unspecified: Secondary | ICD-10-CM | POA: Diagnosis not present

## 2015-11-25 DIAGNOSIS — F419 Anxiety disorder, unspecified: Secondary | ICD-10-CM

## 2015-11-25 DIAGNOSIS — Z86010 Personal history of colonic polyps: Secondary | ICD-10-CM

## 2015-11-25 DIAGNOSIS — F329 Major depressive disorder, single episode, unspecified: Secondary | ICD-10-CM

## 2015-11-25 MED ORDER — CYANOCOBALAMIN 1000 MCG/ML IJ SOLN: 1000.0000 ug | Freq: Once | INTRAMUSCULAR | Status: DC

## 2015-11-25 NOTE — Progress Notes (Signed)
Pre visit review using our clinic review tool, if applicable. No additional management support is needed unless otherwise documented below in the visit note. 

## 2015-11-25 NOTE — Progress Notes (Signed)
Patient ID: Angel Riddle, female   DOB: [birth year], 59 y.o.   MRN: [id number]   Subjective:    Patient ID: Angel Riddle, female    DOB: 08/09/1956, 59 y.o.   MRN: [id number]  HPI  Patient with past history of hypercholesterolemia, GERD, depression and chronic back pain.  She comes in today to follow up on these issues.  Still under increased stress with family issues.  She has been seeing a holistic therapist Juanda Bond).  She is taking an increased amount of herbal supplements".  States she has been feeling better and is overall doing better than her last visit.  No chest pain or tightness.  We discussed her cholesterol.  Discussed increased cholesterol and treatment options.  She declines to change her cholesterol medication.  States she wants to try something more natural.  Breathing stable.  No nausea or vomiting.  Bowels stable.     Past Medical History  Diagnosis Date  . Arthritis   . Depression   . Allergy   . Hypercholesterolemia   . Hx: UTI (urinary tract infection)   . Hx of colonic polyp   . Chronic back pain     degenerative disc disease  . Gallstones   . Hypertension   . Panic attacks     H/O  . Anxiety   . GERD (gastroesophageal reflux disease)   . Degenerative disc disease, lumbar   . Heart murmur     followed by PCP  . Wears dentures     partial upper and lower   Past Surgical History  Procedure Laterality Date  . Abdominal hysterectomy  1992    partial  . Tonsillectomy and adenoidectomy  1969  . Skin surgery Left 08/27/13    Basal cell removal-left nostril  . Foot surgery Right 2009  . Cataract extraction Bilateral [id number]  . Cholecystectomy  04-19-14  . Colonoscopy  07/02/11    DR. Byrnett  . Mouth surgery    . Colonoscopy with propofol N/A 09/25/2015    Procedure: COLONOSCOPY WITH PROPOFOL;  Surgeon: Lucilla Lame, MD;  Location: [place];  Service: Endoscopy;  Laterality: N/A;  . Polypectomy  09/25/2015    Procedure: POLYPECTOMY;   Surgeon: Lucilla Lame, MD;  Location: [place];  Service: Endoscopy;;   Family History  Problem Relation Age of Onset  . Hyperlipidemia Mother   . Hypertension Mother   . Alcohol abuse Father   . Hyperlipidemia Father   . Heart disease Father   . Diabetes Father   . Hyperlipidemia Sister   . Lung cancer Brother   . Hyperlipidemia Brother    Social History   Social History  . Marital Status: Married    Spouse Name: N/A  . Number of Children: 2  . Years of Education: N/A   Social History Main Topics  . Smoking status: Former Smoker    Quit date: 12/23/2011  . Smokeless tobacco: Never Used  . Alcohol Use: 0.6 oz/week    0 Standard drinks or equivalent, 1 Glasses of wine per week  . Drug Use: No  . Sexual Activity: Not Asked   Other Topics Concern  . None   Social History Narrative    Outpatient Encounter Prescriptions as of 11/25/2015  Medication Sig  . ALPRAZolam (XANAX) 1 MG tablet Take 1 tablet (1 mg total) by mouth 2 (two) times daily as needed for sleep.  . Diphenhyd-Hydrocort-Nystatin (FIRST-DUKES MOUTHWASH) SUSP 10cc's swish and spit tid  . FLUoxetine (PROZAC) 40  MG capsule Take 1 capsule (40 mg total) by mouth daily.  Marland Kitchen gabapentin (NEURONTIN) 100 MG capsule Take 200 mg by mouth at bedtime.  . hydrochlorothiazide (HYDRODIURIL) 25 MG tablet Take 0.5 tablets (12.5 mg total) by mouth daily.  Marland Kitchen HYDROcodone-acetaminophen (NORCO/VICODIN) 5-325 MG per tablet Take 5-325 tablets by mouth 2 (two) times daily as needed.  . meloxicam (MOBIC) 15 MG tablet Take 1 tablet (15 mg total) by mouth daily.  . ondansetron (ZOFRAN) 4 MG tablet Take 1 tablet (4 mg total) by mouth 2 (two) times daily as needed for nausea or vomiting.  . pravastatin (PRAVACHOL) 10 MG tablet Take 1 tablet (10 mg total) by mouth daily.  . Pseudoeph-Doxylamine-DM-APAP (NYQUIL PO) Take by mouth.  . Pseudoephedrine HCl 240 MG TB24 Take by mouth as needed.  . [DISCONTINUED] cefdinir (OMNICEF) 300 MG  capsule Take 1 capsule (300 mg total) by mouth 2 (two) times daily.  . [DISCONTINUED] predniSONE (DELTASONE) 10 MG tablet Take 6 tablets x 1 day and then decrease by 1/2 tablet per day until down to zero mg.  . fluticasone (FLONASE) 50 MCG/ACT nasal spray Place 1 spray into both nostrils. Reported on 11/25/2015  . pantoprazole (PROTONIX) 40 MG tablet Take 1 tablet (40 mg total) by mouth daily. (Patient not taking: Reported on 11/25/2015)  . ranitidine (ZANTAC) 75 MG tablet Take 75 mg by mouth daily. Reported on 11/25/2015   Facility-Administered Encounter Medications as of 11/25/2015  Medication  . cyanocobalamin ((VITAMIN B-12)) injection 1,000 mcg    Review of Systems  Constitutional: Negative for appetite change and unexpected weight change.  HENT: Negative for congestion and sinus pressure.   Eyes: Negative for discharge and redness.  Respiratory: Negative for cough, chest tightness and shortness of breath.   Cardiovascular: Negative for chest pain, palpitations and leg swelling.  Gastrointestinal: Negative for nausea, vomiting, abdominal pain and diarrhea.  Genitourinary: Negative for dysuria and difficulty urinating.  Musculoskeletal: Positive for back pain. Negative for joint swelling.  Skin: Negative for color change and rash.  Neurological: Negative for dizziness, light-headedness and headaches.  Psychiatric/Behavioral: Negative for agitation.       Increased stress as outlined.         Objective:    Physical Exam  Constitutional: She appears well-developed and well-nourished. No distress.  HENT:  Nose: Nose normal.  Mouth/Throat: Oropharynx is clear and moist.  Eyes: Conjunctivae are normal. Right eye exhibits no discharge. Left eye exhibits no discharge.  Neck: Neck supple. No thyromegaly present.  Cardiovascular: Normal rate and regular rhythm.   Pulmonary/Chest: Breath sounds normal. No respiratory distress. She has no wheezes.  Abdominal: Soft. Bowel sounds are  normal. There is no tenderness.  Musculoskeletal: She exhibits no edema or tenderness.  Lymphadenopathy:    She has no cervical adenopathy.  Skin: No rash noted. No erythema.  Psychiatric: She has a normal mood and affect. Her behavior is normal.    BP 128/86 mmHg  Pulse 77  Temp(Src) 98.9 F (37.2 C) (Oral)  Ht 5\' 5"  (1.651 m)  Wt 196 lb 3.2 oz (88.996 kg)  BMI 32.65 kg/m2  SpO2 98% Wt Readings from Last 3 Encounters:  11/25/15 196 lb 3.2 oz (88.996 kg)  09/25/15 197 lb (89.359 kg)  09/10/15 200 lb 8 oz (90.946 kg)     Lab Results  Component Value Date   WBC 7.1 05/15/2015   HGB 14.1 05/15/2015   HCT 41.4 05/15/2015   PLT 297.0 05/15/2015   GLUCOSE 101* 11/21/2015  CHOL 241* 11/21/2015   TRIG 256.0* 11/21/2015   HDL 44.60 11/21/2015   LDLDIRECT 153.0 11/21/2015   LDLCALC 218* 03/14/2014   ALT 8 11/21/2015   AST 17 11/21/2015   NA 140 11/21/2015   K 3.9 11/21/2015   CL 104 11/21/2015   CREATININE 0.82 11/21/2015   BUN 13 11/21/2015   CO2 27 11/21/2015   TSH 1.41 05/15/2015       Assessment & Plan:   Problem List Items Addressed This Visit    Anal fissure    Found on 09/2015 colonoscopy.  Request refill HC suppositories.        Anxiety    She cancelled her psych appt.  On prozac.  Declines any further intervention.  Wants to continue seeing her holistic therapist.        Chronic back pain    Followed by Dr Sharlet Salina.       Depression    Still with family stressors.  Discussed with her today.  She cancelled her psych appt.  Is seeing a holistic therapist now.  Feels better.  Wants to continue with "more natural therapy".  Follow.        History of colonic polyps    Colonoscopy 09/2015 - anal fissure, one 33mm polyp sigmoid and non bleeding internal hemorrhoids.  Recommended f/u in 5 years.        Hypercholesterolemia    On pravastatin.  Low cholesterol diet and exercise.  Follow lipid panel and liver function tests.        Relevant Orders    Hepatic function panel   Lipid panel   Hypertension    Blood pressure as outlined.  Continue current medication regimen.  Follow pressures.  Follow metabolic panel.       Relevant Orders   Basic metabolic panel    Other Visit Diagnoses    B12 deficiency    -  Primary    Relevant Medications    cyanocobalamin ((VITAMIN B-12)) injection 1,000 mcg        Einar Pheasant, MD

## 2015-11-28 ENCOUNTER — Telehealth: Payer: Self-pay | Admitting: *Deleted

## 2015-11-28 MED ORDER — HYDROCORTISONE ACETATE 25 MG RE SUPP: 25.0000 mg | Freq: Two times a day (BID) | RECTAL | Status: DC

## 2015-11-28 NOTE — Telephone Encounter (Signed)
Patient requested a medication refill on her suppository for hemoroid.

## 2015-11-28 NOTE — Telephone Encounter (Signed)
i am unclear on the suppository she is referring too. Please advise?

## 2015-11-28 NOTE — Telephone Encounter (Signed)
I sent in rx for annusol HC suppositories to [facility].   Please let her know.  Thanks.

## 2015-11-28 NOTE — Telephone Encounter (Signed)
Patient was made aware of prescription.

## 2015-12-01 ENCOUNTER — Encounter: Payer: Self-pay | Admitting: Internal Medicine

## 2015-12-01 NOTE — Assessment & Plan Note (Signed)
On pravastatin.  Low cholesterol diet and exercise.  Follow lipid panel and liver function tests.   

## 2015-12-01 NOTE — Assessment & Plan Note (Signed)
Blood pressure as outlined.  Continue current medication regimen.  Follow pressures.  Follow metabolic panel.  

## 2015-12-01 NOTE — Assessment & Plan Note (Signed)
Colonoscopy 09/2015 - anal fissure, one 42mm polyp sigmoid and non bleeding internal hemorrhoids.  Recommended f/u in 5 years.

## 2015-12-01 NOTE — Assessment & Plan Note (Signed)
She cancelled her psych appt.  On prozac.  Declines any further intervention.  Wants to continue seeing her holistic therapist.

## 2015-12-01 NOTE — Assessment & Plan Note (Signed)
Followed by Dr Chasnis.   

## 2015-12-01 NOTE — Assessment & Plan Note (Signed)
Still with family stressors.  Discussed with her today.  She cancelled her psych appt.  Is seeing a holistic therapist now.  Feels better.  Wants to continue with "more natural therapy".  Follow.

## 2015-12-01 NOTE — Assessment & Plan Note (Signed)
Found on 09/2015 colonoscopy.  Request refill HC suppositories.

## 2015-12-03 ENCOUNTER — Telehealth: Payer: Self-pay | Admitting: *Deleted

## 2015-12-26 ENCOUNTER — Other Ambulatory Visit: Payer: Self-pay | Admitting: Internal Medicine

## 2016-01-07 ENCOUNTER — Other Ambulatory Visit: Payer: Self-pay | Admitting: Internal Medicine

## 2016-02-04 ENCOUNTER — Other Ambulatory Visit: Payer: Self-pay | Admitting: Internal Medicine

## 2016-02-04 NOTE — Telephone Encounter (Signed)
Last ov: 11/25/15 & Next OV: 03/25/16. Okay to refill?

## 2016-02-04 NOTE — Telephone Encounter (Signed)
ok'd refill for xanax #[phone] with no refills.   

## 2016-02-05 NOTE — Telephone Encounter (Signed)
Rx faxed

## 2016-02-08 ENCOUNTER — Other Ambulatory Visit: Payer: Self-pay | Admitting: Internal Medicine

## 2016-03-08 ENCOUNTER — Other Ambulatory Visit: Payer: Self-pay | Admitting: Internal Medicine

## 2016-03-12 ENCOUNTER — Ambulatory Visit
Admission: RE | Admit: 2016-03-12 | Discharge: 2016-03-12 | Disposition: A | Discharge status: Home / Self Care | Payer: 59 | Source: Ambulatory Visit | Attending: Internal Medicine | Admitting: Internal Medicine

## 2016-03-12 DIAGNOSIS — Z1231 Encounter for screening mammogram for malignant neoplasm of breast: Secondary | ICD-10-CM | POA: Diagnosis present

## 2016-03-23 ENCOUNTER — Other Ambulatory Visit (INDEPENDENT_AMBULATORY_CARE_PROVIDER_SITE_OTHER): Payer: 59

## 2016-03-23 ENCOUNTER — Telehealth: Payer: Self-pay | Admitting: *Deleted

## 2016-03-23 DIAGNOSIS — E538 Deficiency of other specified B group vitamins: Secondary | ICD-10-CM | POA: Diagnosis not present

## 2016-03-23 DIAGNOSIS — E7800 Pure hypercholesterolemia, unspecified: Secondary | ICD-10-CM | POA: Diagnosis not present

## 2016-03-23 DIAGNOSIS — I10 Essential (primary) hypertension: Secondary | ICD-10-CM | POA: Diagnosis not present

## 2016-03-23 LAB — LIPID PANEL
CHOL/HDL RATIO: 6
CHOLESTEROL: 253 mg/dL — AB (ref 0–200)
HDL: 44.3 mg/dL (ref >39.00)
NONHDL: 208.45
TRIGLYCERIDES: 346 mg/dL — AB (ref 0.0–149.0)
VLDL: 69.2 mg/dL — AB (ref 0.0–40.0)

## 2016-03-23 LAB — HEPATIC FUNCTION PANEL
ALT: 12 U/L (ref 0–35)
AST: 23 U/L (ref 0–37)
Albumin: 4 g/dL (ref 3.5–5.2)
Alkaline Phosphatase: 78 U/L (ref 39–117)
BILIRUBIN DIRECT: 0.1 mg/dL (ref 0.0–0.3)
BILIRUBIN TOTAL: 0.5 mg/dL (ref 0.2–1.2)
Total Protein: 7 g/dL (ref 6.0–8.3)

## 2016-03-23 LAB — VITAMIN B12: Vitamin B-12: 171 pg/mL — ABNORMAL LOW (ref 211–911)

## 2016-03-23 LAB — BASIC METABOLIC PANEL
BUN: 13 mg/dL (ref 6–23)
CALCIUM: 9.3 mg/dL (ref 8.4–10.5)
CO2: 31 mEq/L (ref 19–32)
CREATININE: 0.93 mg/dL (ref 0.40–1.20)
Chloride: 99 mEq/L (ref 96–112)
GFR: 65.33 mL/min (ref >60.00)
Glucose, Bld: 93 mg/dL (ref 70–99)
Potassium: 3.4 mEq/L — ABNORMAL LOW (ref 3.5–5.1)
Sodium: 136 mEq/L (ref 135–145)

## 2016-03-23 LAB — LDL CHOLESTEROL, DIRECT: LDL DIRECT: 145 mg/dL

## 2016-03-23 NOTE — Telephone Encounter (Signed)
Order placed for B12 lab.   

## 2016-03-23 NOTE — Telephone Encounter (Signed)
Pt would like to add b12?  

## 2016-03-23 NOTE — Telephone Encounter (Signed)
Add on sent over

## 2016-03-25 ENCOUNTER — Ambulatory Visit (INDEPENDENT_AMBULATORY_CARE_PROVIDER_SITE_OTHER): Payer: 59 | Admitting: Internal Medicine

## 2016-03-25 ENCOUNTER — Encounter: Payer: Self-pay | Admitting: Internal Medicine

## 2016-03-25 ENCOUNTER — Encounter: Payer: Self-pay | Admitting: *Deleted

## 2016-03-25 VITALS — BP 110/80 | HR 81 | Temp 99.1°F | Resp 18 | Ht 65.0 in | Wt 194.5 lb

## 2016-03-25 DIAGNOSIS — M549 Dorsalgia, unspecified: Secondary | ICD-10-CM

## 2016-03-25 DIAGNOSIS — F32A Depression, unspecified: Secondary | ICD-10-CM

## 2016-03-25 DIAGNOSIS — E538 Deficiency of other specified B group vitamins: Secondary | ICD-10-CM

## 2016-03-25 DIAGNOSIS — G8929 Other chronic pain: Secondary | ICD-10-CM

## 2016-03-25 DIAGNOSIS — I10 Essential (primary) hypertension: Secondary | ICD-10-CM

## 2016-03-25 DIAGNOSIS — F419 Anxiety disorder, unspecified: Secondary | ICD-10-CM

## 2016-03-25 DIAGNOSIS — F329 Major depressive disorder, single episode, unspecified: Secondary | ICD-10-CM

## 2016-03-25 DIAGNOSIS — R110 Nausea: Secondary | ICD-10-CM | POA: Diagnosis not present

## 2016-03-25 DIAGNOSIS — E7800 Pure hypercholesterolemia, unspecified: Secondary | ICD-10-CM | POA: Diagnosis not present

## 2016-03-25 MED ORDER — PROMETHAZINE HCL 12.5 MG PO TABS: 12.5000 mg | ORAL_TABLET | Freq: Three times a day (TID) | ORAL | Status: DC | PRN

## 2016-03-25 MED ORDER — CYANOCOBALAMIN 1000 MCG/ML IJ SOLN
1000.0000 ug | Freq: Once | INTRAMUSCULAR | Status: AC
  Administered 2016-03-25: 1000 ug via INTRAMUSCULAR

## 2016-03-25 NOTE — Progress Notes (Signed)
Patient ID: Angel Riddle, female   DOB: 06/02/1956, 60 y.o.   MRN: [id number]   Subjective:    Patient ID: Angel Riddle, female    DOB: 1956/11/27, 60 y.o.   MRN: [id number]  HPI  Patient here for a scheduled follow up.  Seeing Dr Sharlet Salina for her back.  Has low back and leg pains.  S/p two injections yesterday.  Taking hydrocodone.  Still with increased stress.  Increased stress with family issues.  Increased stress with her son.  On prozac.  Reports persistent increased nausea and belching.  No abdominal pain or cramping.  Worse in the evening.  Drinking an increased amount of ginger ale.  No vomiting.  Taking holistic drops.  Helps some.     Past Medical History  Diagnosis Date  . Arthritis   . Depression   . Allergy   . Hypercholesterolemia   . Hx: UTI (urinary tract infection)   . Hx of colonic polyp   . Chronic back pain     degenerative disc disease  . Gallstones   . Hypertension   . Panic attacks     H/O  . Anxiety   . GERD (gastroesophageal reflux disease)   . Degenerative disc disease, lumbar   . Heart murmur     followed by PCP  . Wears dentures     partial upper and lower   Past Surgical History  Procedure Laterality Date  . Abdominal hysterectomy  1992    partial  . Tonsillectomy and adenoidectomy  1969  . Skin surgery Left 08/27/13    Basal cell removal-left nostril  . Foot surgery Right 2009  . Cataract extraction Bilateral [id number]  . Cholecystectomy  04-19-14  . Colonoscopy  07/02/11    DR. Byrnett  . Mouth surgery    . Colonoscopy with propofol N/A 09/25/2015    Procedure: COLONOSCOPY WITH PROPOFOL;  Surgeon: Lucilla Lame, MD;  Location: [place];  Service: Endoscopy;  Laterality: N/A;  . Polypectomy  09/25/2015    Procedure: POLYPECTOMY;  Surgeon: Lucilla Lame, MD;  Location: [place];  Service: Endoscopy;;   Family History  Problem Relation Age of Onset  . Hyperlipidemia Mother   . Hypertension Mother   . Alcohol abuse  Father   . Hyperlipidemia Father   . Heart disease Father   . Diabetes Father   . Hyperlipidemia Sister   . Lung cancer Brother   . Hyperlipidemia Brother    Social History   Social History  . Marital Status: Married    Spouse Name: N/A  . Number of Children: 2  . Years of Education: N/A   Social History Main Topics  . Smoking status: Former Smoker    Quit date: 12/23/2011  . Smokeless tobacco: Never Used  . Alcohol Use: 0.6 oz/week    0 Standard drinks or equivalent, 1 Glasses of wine per week  . Drug Use: No  . Sexual Activity: Not Asked   Other Topics Concern  . None   Social History Narrative    Outpatient Encounter Prescriptions as of 03/25/2016  Medication Sig  . ALPRAZolam (XANAX) 1 MG tablet TAKE 1 TABLET TWICE DAILY AS NEEDED FOR SLEEP.  Marland Kitchen FLUoxetine (PROZAC) 40 MG capsule Take 1 capsule (40 mg total) by mouth daily.  . fluticasone (FLONASE) 50 MCG/ACT nasal spray Place 1 spray into both nostrils. Reported on 11/25/2015  . gabapentin (NEURONTIN) 100 MG capsule Take 200 mg by mouth at bedtime.  . hydrochlorothiazide (HYDRODIURIL)  25 MG tablet Take 0.5 tablets (12.5 mg total) by mouth daily.  Marland Kitchen HYDROcodone-acetaminophen (NORCO/VICODIN) 5-325 MG per tablet Take 5-325 tablets by mouth 2 (two) times daily as needed.  . hydrocortisone (ANUSOL-HC) 25 MG suppository Place 1 suppository (25 mg total) rectally 2 (two) times daily.  . meloxicam (MOBIC) 15 MG tablet Take 1 tablet (15 mg total) by mouth daily.  . pantoprazole (PROTONIX) 40 MG tablet Take 1 tablet (40 mg total) by mouth daily.  . pravastatin (PRAVACHOL) 10 MG tablet Take 1 tablet (10 mg total) by mouth daily.  . Pseudoeph-Doxylamine-DM-APAP (NYQUIL PO) Take by mouth.  . Pseudoephedrine HCl 240 MG TB24 Take by mouth as needed.  . ranitidine (ZANTAC) 75 MG tablet Take 75 mg by mouth daily. Reported on 11/25/2015  . [DISCONTINUED] ondansetron (ZOFRAN) 4 MG tablet Take 1 tablet (4 mg total) by mouth 2 (two) times  daily as needed for nausea or vomiting.  . promethazine (PHENERGAN) 12.5 MG tablet Take 1 tablet (12.5 mg total) by mouth every 8 (eight) hours as needed for nausea or vomiting.  . [DISCONTINUED] Diphenhyd-Hydrocort-Nystatin (FIRST-DUKES MOUTHWASH) SUSP 10cc's swish and spit tid (Patient not taking: Reported on 03/25/2016)  . [EXPIRED] cyanocobalamin ((VITAMIN B-12)) injection 1,000 mcg   . [DISCONTINUED] cyanocobalamin ((VITAMIN B-12)) injection 1,000 mcg    No facility-administered encounter medications on file as of 03/25/2016.    Review of Systems  Constitutional: Negative for appetite change and unexpected weight change.  HENT: Negative for congestion and sinus pressure.   Respiratory: Negative for cough, chest tightness and shortness of breath.   Cardiovascular: Negative for chest pain, palpitations and leg swelling.  Gastrointestinal: Positive for nausea. Negative for vomiting, abdominal pain and diarrhea.  Genitourinary: Negative for dysuria and difficulty urinating.  Musculoskeletal: Positive for back pain.       Leg pain.   Skin: Negative for color change and rash.  Neurological: Negative for dizziness, light-headedness and headaches.  Psychiatric/Behavioral: Negative for dysphoric mood and agitation.       Increased stress as outlined.        Objective:    Physical Exam  Constitutional: She appears well-developed and well-nourished. No distress.  HENT:  Nose: Nose normal.  Mouth/Throat: Oropharynx is clear and moist.  Neck: Neck supple. No thyromegaly present.  Cardiovascular: Normal rate and regular rhythm.   Pulmonary/Chest: Breath sounds normal. No respiratory distress. She has no wheezes.  Abdominal: Soft. Bowel sounds are normal. There is no tenderness.  Musculoskeletal: She exhibits no edema or tenderness.  Lymphadenopathy:    She has no cervical adenopathy.  Skin: No rash noted. No erythema.  Psychiatric: She has a normal mood and affect. Her behavior is  normal.    BP 110/80 mmHg  Pulse 81  Temp(Src) 99.1 F (37.3 C) (Oral)  Resp 18  Ht 5\' 5"  (1.651 m)  Wt 194 lb 8 oz (88.225 kg)  BMI 32.37 kg/m2  SpO2 94% Wt Readings from Last 3 Encounters:  03/25/16 194 lb 8 oz (88.225 kg)  11/25/15 196 lb 3.2 oz (88.996 kg)  09/25/15 197 lb (89.359 kg)     Lab Results  Component Value Date   WBC 7.1 05/15/2015   HGB 14.1 05/15/2015   HCT 41.4 05/15/2015   PLT 297.0 05/15/2015   GLUCOSE 93 03/23/2016   CHOL 253* 03/23/2016   TRIG 346.0* 03/23/2016   HDL 44.30 03/23/2016   LDLDIRECT 145.0 03/23/2016   LDLCALC 218* 03/14/2014   ALT 12 03/23/2016   AST 23 03/23/2016  NA 136 03/23/2016   K 3.4* 03/23/2016   CL 99 03/23/2016   CREATININE 0.93 03/23/2016   BUN 13 03/23/2016   CO2 31 03/23/2016   TSH 1.41 05/15/2015    Mm Digital Screening Bilateral  03/12/2016  CLINICAL DATA:  Screening. EXAM: DIGITAL SCREENING BILATERAL MAMMOGRAM WITH CAD COMPARISON:  Previous exam(s). ACR Breast Density Category b: There are scattered areas of fibroglandular density. FINDINGS: There are no findings suspicious for malignancy. Images were processed with CAD. IMPRESSION: No mammographic evidence of malignancy. A result letter of this screening mammogram will be mailed directly to the patient. RECOMMENDATION: Screening mammogram in one year. (Code:SM-B-01Y) BI-RADS CATEGORY  1: Negative. Electronically Signed   By: Claudie Revering M.D.   On: 03/12/2016 12:24       Assessment & Plan:   Problem List Items Addressed This Visit    Anxiety    Increased stress as outlined.  On prozac.  Discussed with her today.  She previously cancelled psych appt.  Does not feel needs any further intervention.  Follow.        Chronic back pain    Seeing Dr Sharlet Salina.  S/p injections.  Taking hydrocodone.  Follow.        Depression    Increased family stressors.  On prozac.  Does not feel needs any further intervention.  Follow.       Hypercholesterolemia    Low  cholesterol diet and exercise.  Follow lipid panel and liver function tests.  On pravastatin.        Hypertension    Blood pressure under good control.  Continue same medication regimen.  Follow pressures.  Follow metabolic panel.        Nausea without vomiting - Primary    Persistent.  Unclear etiology.  Taking zantac and protonix.  No acid reflux reported.  Check abdominal ultrasound.        Relevant Orders   US Abdomen Complete    Other Visit Diagnoses    B12 deficiency        Relevant Medications    cyanocobalamin ((VITAMIN B-12)) injection 1,000 mcg (Completed)        Einar Pheasant, MD

## 2016-03-25 NOTE — Progress Notes (Signed)
Pre-visit discussion using our clinic review tool. No additional management support is needed unless otherwise documented below in the visit note.  

## 2016-03-29 ENCOUNTER — Encounter: Payer: Self-pay | Admitting: Internal Medicine

## 2016-03-29 ENCOUNTER — Other Ambulatory Visit: Payer: Self-pay | Admitting: Internal Medicine

## 2016-03-29 DIAGNOSIS — R110 Nausea: Secondary | ICD-10-CM | POA: Insufficient documentation

## 2016-03-29 NOTE — Assessment & Plan Note (Signed)
Increased stress as outlined.  On prozac.  Discussed with her today.  She previously cancelled psych appt.  Does not feel needs any further intervention.  Follow.

## 2016-03-29 NOTE — Assessment & Plan Note (Signed)
Increased family stressors.  On prozac.  Does not feel needs any further intervention.  Follow.

## 2016-03-29 NOTE — Assessment & Plan Note (Signed)
Seeing Dr Sharlet Salina.  S/p injections.  Taking hydrocodone.  Follow.

## 2016-03-29 NOTE — Assessment & Plan Note (Signed)
Persistent.  Unclear etiology.  Taking zantac and protonix.  No acid reflux reported.  Check abdominal ultrasound.

## 2016-03-29 NOTE — Assessment & Plan Note (Signed)
Low cholesterol diet and exercise.  Follow lipid panel and liver function tests.  On pravastatin.   

## 2016-03-29 NOTE — Assessment & Plan Note (Signed)
Blood pressure under good control.  Continue same medication regimen.  Follow pressures.  Follow metabolic panel.   

## 2016-03-30 ENCOUNTER — Ambulatory Visit: Payer: 59

## 2016-04-01 ENCOUNTER — Ambulatory Visit: Payer: 59

## 2016-04-02 ENCOUNTER — Other Ambulatory Visit: Payer: Self-pay | Admitting: Internal Medicine

## 2016-04-06 ENCOUNTER — Ambulatory Visit: Payer: 59

## 2016-04-12 ENCOUNTER — Ambulatory Visit: Payer: 59

## 2016-04-13 ENCOUNTER — Ambulatory Visit
Admission: RE | Admit: 2016-04-13 | Discharge: 2016-04-13 | Disposition: A | Discharge status: Home / Self Care | Payer: 59 | Source: Ambulatory Visit | Attending: Internal Medicine | Admitting: Internal Medicine

## 2016-04-13 ENCOUNTER — Ambulatory Visit (INDEPENDENT_AMBULATORY_CARE_PROVIDER_SITE_OTHER): Payer: 59

## 2016-04-13 DIAGNOSIS — E538 Deficiency of other specified B group vitamins: Secondary | ICD-10-CM | POA: Diagnosis not present

## 2016-04-13 DIAGNOSIS — N200 Calculus of kidney: Secondary | ICD-10-CM | POA: Diagnosis not present

## 2016-04-13 DIAGNOSIS — R110 Nausea: Secondary | ICD-10-CM

## 2016-04-13 MED ORDER — CYANOCOBALAMIN 1000 MCG/ML IJ SOLN
1000.0000 ug | Freq: Once | INTRAMUSCULAR | Status: AC
  Administered 2016-04-13: 1000 ug via INTRAMUSCULAR

## 2016-04-13 NOTE — Progress Notes (Signed)
Patient came in for b12 injection.  Received in Left deltoid.  Patient tolerated well  

## 2016-04-15 ENCOUNTER — Other Ambulatory Visit: Payer: Self-pay | Admitting: Internal Medicine

## 2016-04-15 DIAGNOSIS — R110 Nausea: Secondary | ICD-10-CM

## 2016-04-15 NOTE — Progress Notes (Signed)
Order placed for GI referral.   

## 2016-04-20 ENCOUNTER — Ambulatory Visit (INDEPENDENT_AMBULATORY_CARE_PROVIDER_SITE_OTHER): Payer: 59 | Admitting: Surgical

## 2016-04-20 DIAGNOSIS — E538 Deficiency of other specified B group vitamins: Secondary | ICD-10-CM

## 2016-04-20 MED ORDER — CYANOCOBALAMIN 1000 MCG/ML IJ SOLN: 1000.0000 ug | Freq: Once | INTRAMUSCULAR | Status: DC

## 2016-04-20 NOTE — Progress Notes (Signed)
B 12 injection given left deltoid. Patient tolerated well

## 2016-04-27 ENCOUNTER — Ambulatory Visit (INDEPENDENT_AMBULATORY_CARE_PROVIDER_SITE_OTHER): Payer: 59

## 2016-04-27 DIAGNOSIS — E538 Deficiency of other specified B group vitamins: Secondary | ICD-10-CM

## 2016-04-27 MED ORDER — CYANOCOBALAMIN 1000 MCG/ML IJ SOLN
1000.0000 ug | Freq: Once | INTRAMUSCULAR | Status: AC
  Administered 2016-04-27: 1000 ug via INTRAMUSCULAR

## 2016-04-27 NOTE — Progress Notes (Signed)
Patient presented for weekly B12 injection #4, per Dr. Bary Leriche order.  Administered R deltoid.  Tolerated well.  Next administration to be given at follow up appointment in 1 month.

## 2016-05-17 ENCOUNTER — Telehealth: Payer: Self-pay | Admitting: Internal Medicine

## 2016-05-17 ENCOUNTER — Other Ambulatory Visit: Payer: Self-pay | Admitting: *Deleted

## 2016-05-17 NOTE — Telephone Encounter (Signed)
Last dispensed: 04/05/16 Last office visit : 03/25/16 Future appointment: 06/01/16 okay to fill?

## 2016-05-17 NOTE — Telephone Encounter (Signed)
Pt called needing a refill for ALPRAZolam (XANAX) 1 MG tablet.   Pharmacy is [place], [place] - [address]  Call pt @ [phone]. Can leave a detailed msg. Thank you!

## 2016-05-17 NOTE — Telephone Encounter (Signed)
[facility] [place] [address] [phone] or fax [phone]  Refill request: ALPRAZolam Duanne Moron) 1 MG tablet

## 2016-05-18 ENCOUNTER — Ambulatory Visit (INDEPENDENT_AMBULATORY_CARE_PROVIDER_SITE_OTHER): Payer: 59 | Admitting: Gastroenterology

## 2016-05-18 ENCOUNTER — Encounter: Payer: Self-pay | Admitting: Gastroenterology

## 2016-05-18 VITALS — BP 128/84 | HR 98 | Temp 98.3°F | Ht 65.0 in | Wt 193.8 lb

## 2016-05-18 DIAGNOSIS — K760 Fatty (change of) liver, not elsewhere classified: Secondary | ICD-10-CM

## 2016-05-18 DIAGNOSIS — R112 Nausea with vomiting, unspecified: Secondary | ICD-10-CM | POA: Diagnosis not present

## 2016-05-18 MED ORDER — ALPRAZOLAM 1 MG PO TABS: ORAL_TABLET | ORAL | Status: DC

## 2016-05-18 NOTE — Telephone Encounter (Signed)
ok'd refill for alprazolam #60 with one refill.   

## 2016-05-18 NOTE — Progress Notes (Signed)
Primary Care Physician: Einar Pheasant, MD  Primary Gastroenterologist:  Dr. Lucilla Lame  Chief Complaint  Patient presents with  . Nausea  . beltching    HPI: Angel Riddle is a 60 y.o. female here for persistent nausea.  The patient reports that she very rarely vomits.  The patient states that she is nauseous on a regular basis.  She also reports that she's been under a lot of stress with her husband having cancer and recently losing her brother to metastatic cancer.  The patient states the Protonix she is taking has helped her symptoms but not completely resolved.  There is no report of any unexplained weight loss.  The patient comes with her sister today.  She also was found to have fatty liver but her liver enzymes are normal.  Current Outpatient Prescriptions  Medication Sig Dispense Refill  . ALPRAZolam (XANAX) 1 MG tablet TAKE 1 TABLET TWICE DAILY AS NEEDED 60 tablet 1  . FLUoxetine (PROZAC) 40 MG capsule Take 1 capsule (40 mg total) by mouth daily. 30 capsule 1  . fluticasone (FLONASE) 50 MCG/ACT nasal spray Place 1 spray into both nostrils. Reported on 11/25/2015    . gabapentin (NEURONTIN) 100 MG capsule Take 200 mg by mouth at bedtime.    . hydrochlorothiazide (HYDRODIURIL) 25 MG tablet Take 0.5 tablets (12.5 mg total) by mouth daily. 15 tablet 0  . HYDROcodone-acetaminophen (NORCO/VICODIN) 5-325 MG per tablet Take 5-325 tablets by mouth 2 (two) times daily as needed.    . hydrocortisone (ANUSOL-HC) 25 MG suppository Place 1 suppository (25 mg total) rectally 2 (two) times daily. 14 suppository 0  . meloxicam (MOBIC) 15 MG tablet Take 1 tablet (15 mg total) by mouth daily. 30 tablet 5  . pantoprazole (PROTONIX) 40 MG tablet Take 1 tablet (40 mg total) by mouth daily. 30 tablet 7  . pravastatin (PRAVACHOL) 10 MG tablet Take 1 tablet (10 mg total) by mouth daily. 30 tablet 5  . promethazine (PHENERGAN) 12.5 MG tablet Take 1 tablet (12.5 mg total) by mouth every 8 (eight)  hours as needed for nausea or vomiting. 20 tablet 0  . ranitidine (ZANTAC) 75 MG tablet Take 75 mg by mouth daily. Reported on 11/25/2015    . Pseudoeph-Doxylamine-DM-APAP (NYQUIL PO) Take by mouth. Reported on 05/18/2016    . Pseudoephedrine HCl 240 MG TB24 Take by mouth as needed. Reported on 05/18/2016     Current Facility-Administered Medications  Medication Dose Route Frequency Provider Last Rate Last Dose  . cyanocobalamin ((VITAMIN B-12)) injection 1,000 mcg  1,000 mcg Intramuscular Once Einar Pheasant, MD        Allergies as of 05/18/2016 - Review Complete 03/29/2016  Allergen Reaction Noted  . Levaquin [levofloxacin in d5w]  12/24/2013  . Ivp dye [iodinated diagnostic agents] Nausea And Vomiting 07/16/2013    ROS:  General: Negative for anorexia, weight loss, fever, chills, fatigue, weakness. ENT: Negative for hoarseness, difficulty swallowing , nasal congestion. CV: Negative for chest pain, angina, palpitations, dyspnea on exertion, peripheral edema.  Respiratory: Negative for dyspnea at rest, dyspnea on exertion, cough, sputum, wheezing.  GI: See history of present illness. GU:  Negative for dysuria, hematuria, urinary incontinence, urinary frequency, nocturnal urination.  Endo: Negative for unusual weight change.    Physical Examination:   BP 128/84 mmHg  Pulse 98  Temp(Src) 98.3 F (36.8 C) (Oral)  Ht 5\' 5"  (1.651 m)  Wt 193 lb 12.8 oz (87.907 kg)  BMI 32.25 kg/m2  General: Well-nourished, well-developed  in no acute distress.  Eyes: No icterus. Conjunctivae pink. Mouth: Oropharyngeal mucosa moist and pink , no lesions erythema or exudate. Lungs: Clear to auscultation bilaterally. Non-labored. Heart: Regular rate and rhythm, no murmurs rubs or gallops.  Abdomen: Bowel sounds are normal, nontender, nondistended, no hepatosplenomegaly or masses, no abdominal bruits or hernia , no rebound or guarding.   Extremities: No lower extremity edema. No clubbing or  deformities. Neuro: Alert and oriented x 3.  Grossly intact. Skin: Warm and dry, no jaundice.   Psych: Alert and cooperative, normal mood and affect.  Labs:    Imaging Studies: No results found.  Assessment and Plan:   Angel Riddle is a 60 y.o. y/o female Who comes in today with a history of nausea.  The patient will be started on a trial of Dexilant to see if the nausea is related to her reflux.  The patient has had her gallbladder out in the past.  The patient's liver enzymes are normal so no further workup for her fatty liver disease needs to be done. The patient has been told to reduce weight to help with her fatty liver.  The patient also will continue her Phenergan since she states that Zofran has not helped her.The patient has also been explained of the multiple non-GI causes of nausea including her stress level.  The patient reports that she understands the plan and agrees with the it.   Note: This dictation was prepared with Dragon dictation along with smaller phrase technology. Any transcriptional errors that result from this process are unintentional.

## 2016-05-18 NOTE — Telephone Encounter (Signed)
Rx faxed

## 2016-05-25 ENCOUNTER — Other Ambulatory Visit: Payer: Self-pay

## 2016-05-25 NOTE — Telephone Encounter (Signed)
Please advise refill, hasn't been refilled since January 2017.  Thanks

## 2016-05-26 MED ORDER — HYDROCHLOROTHIAZIDE 25 MG PO TABS: ORAL_TABLET | ORAL | Status: DC

## 2016-05-26 NOTE — Telephone Encounter (Signed)
Please call and document exactly how pt taking.

## 2016-05-26 NOTE — Telephone Encounter (Signed)
Spoke with the patient, she takes a 1/2 tablet so 12.5mg  once a day.

## 2016-05-26 NOTE — Telephone Encounter (Signed)
rx sent in for hctz #15 with one refill.

## 2016-06-01 ENCOUNTER — Telehealth: Payer: Self-pay | Admitting: *Deleted

## 2016-06-01 ENCOUNTER — Ambulatory Visit: Payer: 59 | Admitting: Internal Medicine

## 2016-06-01 NOTE — Telephone Encounter (Signed)
Patient had to cancel her appt this afternoon. She requested to be rescheduled, within a few weeks for a 30 min follow up.

## 2016-06-01 NOTE — Telephone Encounter (Signed)
Please advise, appt was today at 4pm. For a follow up. thanks

## 2016-06-01 NOTE — Telephone Encounter (Signed)
Please reschedule her f/u appt.  Thanks

## 2016-06-04 ENCOUNTER — Telehealth: Payer: Self-pay | Admitting: *Deleted

## 2016-06-04 NOTE — Telephone Encounter (Signed)
If this was just an isolated reading and she is doing fine, then have her spot check her pressure and record.  See if remains elevated.  If remains elevated, then can adjust medication.  Confirm no acute issues.

## 2016-06-04 NOTE — Telephone Encounter (Signed)
Made another attempt to contact patient on both numbers listed in record, no answer.  Left another message to call the office back.

## 2016-06-04 NOTE — Telephone Encounter (Signed)
Patient had a blood pressure of 128/94, she questioned if she should increase her dosage on her blood pressure medication.  Pt contact [phone]

## 2016-06-04 NOTE — Telephone Encounter (Signed)
Attempted to call patient for more recent BP readings.  No answer.  Left a message to call the office back.

## 2016-06-09 ENCOUNTER — Other Ambulatory Visit: Payer: Self-pay | Admitting: Internal Medicine

## 2016-06-15 ENCOUNTER — Ambulatory Visit (INDEPENDENT_AMBULATORY_CARE_PROVIDER_SITE_OTHER): Payer: 59

## 2016-06-15 DIAGNOSIS — E538 Deficiency of other specified B group vitamins: Secondary | ICD-10-CM

## 2016-06-15 MED ORDER — CYANOCOBALAMIN 1000 MCG/ML IJ SOLN
1000.0000 ug | Freq: Once | INTRAMUSCULAR | Status: AC
  Administered 2016-06-15: 1000 ug via INTRAMUSCULAR

## 2016-06-15 NOTE — Progress Notes (Signed)
Patient was in today receiving a B12 injection in the left deltoid. Patient tolerated well. 

## 2016-07-20 ENCOUNTER — Ambulatory Visit: Payer: 59

## 2016-07-22 ENCOUNTER — Ambulatory Visit (INDEPENDENT_AMBULATORY_CARE_PROVIDER_SITE_OTHER): Payer: 59

## 2016-07-22 DIAGNOSIS — E538 Deficiency of other specified B group vitamins: Secondary | ICD-10-CM | POA: Diagnosis not present

## 2016-07-22 MED ORDER — CYANOCOBALAMIN 1000 MCG/ML IJ SOLN
1000.0000 ug | Freq: Once | INTRAMUSCULAR | Status: AC
  Administered 2016-07-22: 1000 ug via INTRAMUSCULAR

## 2016-07-22 NOTE — Progress Notes (Signed)
Patient came in for a b12 injection.  Patient received monthly injections.  Received in Right deltoid.  Patient tolerated well.

## 2016-07-27 ENCOUNTER — Other Ambulatory Visit: Payer: Self-pay

## 2016-07-27 MED ORDER — MELOXICAM 15 MG PO TABS: ORAL_TABLET | ORAL | 5 refills | Status: DC

## 2016-07-30 ENCOUNTER — Telehealth: Payer: Self-pay | Admitting: Internal Medicine

## 2016-07-30 NOTE — Telephone Encounter (Signed)
Informed patient that we can not call in antibiotics into a pharmacy with out patient being seen. There are currently no appointment slots available for today. Patient stated she will go to urgent care for SX.

## 2016-07-30 NOTE — Telephone Encounter (Signed)
Pt states that she has a sinus infection and would like Z-pac called in.. Please advise

## 2016-08-10 ENCOUNTER — Other Ambulatory Visit: Payer: Self-pay | Admitting: Internal Medicine

## 2016-08-11 NOTE — Telephone Encounter (Signed)
Xanax last filled 07/02/16. Last seen 03/25/16. Follow up visit on 08/26/16.

## 2016-08-11 NOTE — Telephone Encounter (Signed)
Rx faxed to [facility] 

## 2016-08-12 ENCOUNTER — Telehealth: Payer: Self-pay | Admitting: Internal Medicine

## 2016-08-12 DIAGNOSIS — E7800 Pure hypercholesterolemia, unspecified: Secondary | ICD-10-CM

## 2016-08-12 DIAGNOSIS — I10 Essential (primary) hypertension: Secondary | ICD-10-CM

## 2016-08-12 NOTE — Telephone Encounter (Signed)
Pt wanted to know if she is due for labs?   Call pt @ [phone]. Thank you!

## 2016-08-12 NOTE — Telephone Encounter (Signed)
Patients last labs were in Apr2017.Please advise.

## 2016-08-13 NOTE — Telephone Encounter (Signed)
Left message for patient to return call to schedule  Lab appointment

## 2016-08-13 NOTE — Telephone Encounter (Signed)
Ok. Pt is scheduled to come in on Monday 09/11.

## 2016-08-13 NOTE — Telephone Encounter (Signed)
Orders placed for labs.  Please schedule fasting lab appt.  Schedule before 08/26/16 appt.

## 2016-08-16 ENCOUNTER — Other Ambulatory Visit (INDEPENDENT_AMBULATORY_CARE_PROVIDER_SITE_OTHER): Payer: 59

## 2016-08-16 DIAGNOSIS — I10 Essential (primary) hypertension: Secondary | ICD-10-CM

## 2016-08-16 DIAGNOSIS — E7800 Pure hypercholesterolemia, unspecified: Secondary | ICD-10-CM

## 2016-08-16 LAB — CBC WITH DIFFERENTIAL/PLATELET
BASOS PCT: 0.4 % (ref 0.0–3.0)
Basophils Absolute: 0 10*3/uL (ref 0.0–0.1)
EOS ABS: 0.1 10*3/uL (ref 0.0–0.7)
Eosinophils Relative: 1.7 % (ref 0.0–5.0)
HCT: 38.8 % (ref 36.0–46.0)
Hemoglobin: 13.3 g/dL (ref 12.0–15.0)
LYMPHS ABS: 2.3 10*3/uL (ref 0.7–4.0)
Lymphocytes Relative: 37 % (ref 12.0–46.0)
MCHC: 34.2 g/dL (ref 30.0–36.0)
MCV: 86.2 fl (ref 78.0–100.0)
MONO ABS: 0.5 10*3/uL (ref 0.1–1.0)
Monocytes Relative: 7.4 % (ref 3.0–12.0)
NEUTROS ABS: 3.3 10*3/uL (ref 1.4–7.7)
Neutrophils Relative %: 53.5 % (ref 43.0–77.0)
PLATELETS: 289 10*3/uL (ref 150.0–400.0)
RBC: 4.5 Mil/uL (ref 3.87–5.11)
RDW: 13.2 % (ref 11.5–15.5)
WBC: 6.1 10*3/uL (ref 4.0–10.5)

## 2016-08-16 LAB — BASIC METABOLIC PANEL
BUN: 14 mg/dL (ref 6–23)
CO2: 29 mEq/L (ref 19–32)
CREATININE: 0.76 mg/dL (ref 0.40–1.20)
Calcium: 9.2 mg/dL (ref 8.4–10.5)
Chloride: 100 mEq/L (ref 96–112)
GFR: 82.36 mL/min (ref >60.00)
Glucose, Bld: 98 mg/dL (ref 70–99)
Potassium: 3.3 mEq/L — ABNORMAL LOW (ref 3.5–5.1)
Sodium: 136 mEq/L (ref 135–145)

## 2016-08-16 LAB — LIPID PANEL
CHOL/HDL RATIO: 5
CHOLESTEROL: 251 mg/dL — AB (ref 0–200)
HDL: 47.7 mg/dL (ref >39.00)
NonHDL: 203.11
Triglycerides: 213 mg/dL — ABNORMAL HIGH (ref 0.0–149.0)
VLDL: 42.6 mg/dL — ABNORMAL HIGH (ref 0.0–40.0)

## 2016-08-16 LAB — HEPATIC FUNCTION PANEL
ALBUMIN: 4.3 g/dL (ref 3.5–5.2)
ALK PHOS: 75 U/L (ref 39–117)
ALT: 11 U/L (ref 0–35)
AST: 22 U/L (ref 0–37)
BILIRUBIN DIRECT: 0.2 mg/dL (ref 0.0–0.3)
TOTAL PROTEIN: 7.1 g/dL (ref 6.0–8.3)
Total Bilirubin: 0.5 mg/dL (ref 0.2–1.2)

## 2016-08-16 LAB — LDL CHOLESTEROL, DIRECT: LDL DIRECT: 158 mg/dL

## 2016-08-16 LAB — TSH: TSH: 1.06 u[IU]/mL (ref 0.35–4.50)

## 2016-08-26 ENCOUNTER — Encounter: Payer: Self-pay | Admitting: Internal Medicine

## 2016-08-26 ENCOUNTER — Ambulatory Visit (INDEPENDENT_AMBULATORY_CARE_PROVIDER_SITE_OTHER): Payer: 59 | Admitting: Internal Medicine

## 2016-08-26 VITALS — BP 120/70 | HR 80 | Temp 98.7°F | Ht 65.0 in | Wt 185.6 lb

## 2016-08-26 DIAGNOSIS — E538 Deficiency of other specified B group vitamins: Secondary | ICD-10-CM | POA: Diagnosis not present

## 2016-08-26 DIAGNOSIS — F32A Depression, unspecified: Secondary | ICD-10-CM

## 2016-08-26 DIAGNOSIS — Z9109 Other allergy status, other than to drugs and biological substances: Secondary | ICD-10-CM

## 2016-08-26 DIAGNOSIS — I10 Essential (primary) hypertension: Secondary | ICD-10-CM | POA: Diagnosis not present

## 2016-08-26 DIAGNOSIS — F329 Major depressive disorder, single episode, unspecified: Secondary | ICD-10-CM

## 2016-08-26 DIAGNOSIS — E876 Hypokalemia: Secondary | ICD-10-CM | POA: Diagnosis not present

## 2016-08-26 DIAGNOSIS — Z91048 Other nonmedicinal substance allergy status: Secondary | ICD-10-CM

## 2016-08-26 DIAGNOSIS — E7800 Pure hypercholesterolemia, unspecified: Secondary | ICD-10-CM

## 2016-08-26 DIAGNOSIS — R110 Nausea: Secondary | ICD-10-CM

## 2016-08-26 DIAGNOSIS — F419 Anxiety disorder, unspecified: Secondary | ICD-10-CM

## 2016-08-26 LAB — POTASSIUM: Potassium: 3.1 mEq/L — ABNORMAL LOW (ref 3.5–5.1)

## 2016-08-26 MED ORDER — CYANOCOBALAMIN 1000 MCG/ML IJ SOLN
1000.0000 ug | Freq: Once | INTRAMUSCULAR | Status: AC
  Administered 2016-08-26: 1000 ug via INTRAMUSCULAR

## 2016-08-26 MED ORDER — HYDROCHLOROTHIAZIDE 12.5 MG PO CAPS: 12.5000 mg | ORAL_CAPSULE | Freq: Every day | ORAL | 3 refills | Status: DC

## 2016-08-26 NOTE — Progress Notes (Signed)
Pre visit review using our clinic review tool, if applicable. No additional management support is needed unless otherwise documented below in the visit note. 

## 2016-08-26 NOTE — Progress Notes (Signed)
Patient ID: Angel Riddle, female   DOB: Jan 14, 1956, 60 y.o.   MRN: [id number]   Subjective:    Patient ID: Angel Riddle, female    DOB: 1956-01-15, 60 y.o.   MRN: [id number]  HPI  Patient here for a scheduled follow up.  Increased stress recently with family issues/deaths.  Discussed with her today.  She wanted to increase her xanax.  She is already taking on some days 1mg  bid.  Explained did not want to increase xanax more.  She is on prozac and feels this works well for her.  Given that she is already on her current regimen and still with issues, discussed my desire for referral to psychiatry.  She has adjusted her diet.  Has lost weight.  Discussed exercise.  No chest pain.  Breathing stable.  No nausea. This is better.  No vomiting.  Saw GI.  They put her on dexilant.     Past Medical History:  Diagnosis Date  . Allergy   . Anxiety   . Arthritis   . Chronic back pain    degenerative disc disease  . Degenerative disc disease, lumbar   . Depression   . Gallstones   . GERD (gastroesophageal reflux disease)   . Heart murmur    followed by PCP  . Hx of colonic polyp   . Hx: UTI (urinary tract infection)   . Hypercholesterolemia   . Hypertension   . Panic attacks    H/O  . Wears dentures    partial upper and lower   Past Surgical History:  Procedure Laterality Date  . ABDOMINAL HYSTERECTOMY  1992   partial  . CATARACT EXTRACTION Bilateral [id number]  . CHOLECYSTECTOMY  04-19-14  . COLONOSCOPY  07/02/11   DR. Byrnett  . COLONOSCOPY WITH PROPOFOL N/A 09/25/2015   Procedure: COLONOSCOPY WITH PROPOFOL;  Surgeon: Lucilla Lame, MD;  Location: [facility];  Service: Endoscopy;  Laterality: N/A;  . FOOT SURGERY Right 2009  . MOUTH SURGERY    . POLYPECTOMY  09/25/2015   Procedure: POLYPECTOMY;  Surgeon: Lucilla Lame, MD;  Location: [place];  Service: Endoscopy;;  . skin surgery Left 08/27/13   Basal cell removal-left nostril  . TONSILLECTOMY AND ADENOIDECTOMY   1969   Family History  Problem Relation Age of Onset  . Hyperlipidemia Mother   . Hypertension Mother   . Alcohol abuse Father   . Hyperlipidemia Father   . Heart disease Father   . Diabetes Father   . Hyperlipidemia Sister   . Lung cancer Brother   . Hyperlipidemia Brother    Social History   Social History  . Marital status: Married    Spouse name: N/A  . Number of children: 2  . Years of education: N/A   Social History Main Topics  . Smoking status: Former Smoker    Quit date: 12/23/2011  . Smokeless tobacco: Never Used  . Alcohol use 0.6 oz/week    1 Glasses of wine per week  . Drug use: No  . Sexual activity: Not Asked   Other Topics Concern  . None   Social History Narrative  . None    Outpatient Encounter Prescriptions as of 08/26/2016  Medication Sig  . ALPRAZolam (XANAX) 1 MG tablet TAKE  (1)  TABLET TWICE A DAY AS NEEDED.  Marland Kitchen FLUoxetine (PROZAC) 40 MG capsule Take 1 capsule (40 mg total) by mouth daily.  . fluticasone (FLONASE) 50 MCG/ACT nasal spray Place 1 spray into both nostrils.  Reported on 11/25/2015  . gabapentin (NEURONTIN) 100 MG capsule Take 200 mg by mouth at bedtime.  Marland Kitchen HYDROcodone-acetaminophen (NORCO/VICODIN) 5-325 MG per tablet Take 5-325 tablets by mouth 2 (two) times daily as needed.  . hydrocortisone (ANUSOL-HC) 25 MG suppository Place 1 suppository (25 mg total) rectally 2 (two) times daily.  . meloxicam (MOBIC) 15 MG tablet Take 1 tablet (15 mg total) by mouth daily.  . pantoprazole (PROTONIX) 40 MG tablet Take 1 tablet (40 mg total) by mouth daily.  . pravastatin (PRAVACHOL) 10 MG tablet Take 1 tablet (10 mg total) by mouth daily.  . promethazine (PHENERGAN) 12.5 MG tablet Take 1 tablet (12.5 mg total) by mouth every 8 (eight) hours as needed for nausea or vomiting.  . Pseudoeph-Doxylamine-DM-APAP (NYQUIL PO) Take by mouth. Reported on 05/18/2016  . Pseudoephedrine HCl 240 MG TB24 Take by mouth as needed. Reported on 05/18/2016  .  ranitidine (ZANTAC) 75 MG tablet Take 75 mg by mouth daily. Reported on 11/25/2015  . [DISCONTINUED] hydrochlorothiazide (HYDRODIURIL) 25 MG tablet Take 0.5 tablets (12.5 mg total) by mouth daily.  . hydrochlorothiazide (MICROZIDE) 12.5 MG capsule Take 1 capsule (12.5 mg total) by mouth daily.   Facility-Administered Encounter Medications as of 08/26/2016  Medication  . cyanocobalamin ((VITAMIN B-12)) injection 1,000 mcg  . [COMPLETED] cyanocobalamin ((VITAMIN B-12)) injection 1,000 mcg    Review of Systems  Constitutional:       Has adjusted her diet.  Lost weight.    HENT: Negative for congestion and sinus pressure.   Respiratory: Negative for cough, chest tightness and shortness of breath.   Cardiovascular: Negative for chest pain, palpitations and leg swelling.  Gastrointestinal: Negative for abdominal pain, diarrhea, nausea and vomiting.  Genitourinary: Negative for difficulty urinating and dysuria.  Musculoskeletal: Positive for back pain.       Chronic back pain.   Skin: Negative for color change and rash.  Neurological: Negative for dizziness, light-headedness and headaches.  Psychiatric/Behavioral: Negative for agitation and dysphoric mood.       Increased stress as outlined.         Objective:    Physical Exam  Constitutional: She appears well-developed and well-nourished. No distress.  HENT:  Nose: Nose normal.  Mouth/Throat: Oropharynx is clear and moist.  Neck: Neck supple. No thyromegaly present.  Cardiovascular: Normal rate and regular rhythm.   Pulmonary/Chest: Breath sounds normal. No respiratory distress. She has no wheezes.  Abdominal: Soft. Bowel sounds are normal. There is no tenderness.  Musculoskeletal: She exhibits no edema or tenderness.  Lymphadenopathy:    She has no cervical adenopathy.  Skin: No rash noted. No erythema.  Psychiatric: She has a normal mood and affect. Her behavior is normal.    BP 120/70   Pulse 80   Temp 98.7 F (37.1 C)  (Oral)   Ht 5\' 5"  (1.651 m)   Wt 185 lb 9.6 oz (84.2 kg)   SpO2 96%   BMI 30.89 kg/m  Wt Readings from Last 3 Encounters:  08/26/16 185 lb 9.6 oz (84.2 kg)  05/18/16 193 lb 12.8 oz (87.9 kg)  03/25/16 194 lb 8 oz (88.2 kg)     Lab Results  Component Value Date   WBC 6.1 08/16/2016   HGB 13.3 08/16/2016   HCT 38.8 08/16/2016   PLT 289.0 08/16/2016   GLUCOSE 98 08/16/2016   CHOL 251 (H) 08/16/2016   TRIG 213.0 (H) 08/16/2016   HDL 47.70 08/16/2016   LDLDIRECT 158.0 08/16/2016   LDLCALC 218 (H)  03/14/2014   ALT 11 08/16/2016   AST 22 08/16/2016   NA 136 08/16/2016   K 3.1 (L) 08/26/2016   CL 100 08/16/2016   CREATININE 0.76 08/16/2016   BUN 14 08/16/2016   CO2 29 08/16/2016   TSH 1.06 08/16/2016    US Abdomen Complete  Result Date: 04/13/2016 CLINICAL DATA:  Persistent nausea without vomiting ; history of hypercholesterolemia, gallstones, cholecystectomy. EXAM: ABDOMEN ULTRASOUND COMPLETE COMPARISON:  Abdominal ultrasound of March 11, 2014 FINDINGS: Gallbladder: The gallbladder is surgically absent. Common bile duct: Diameter: 6.2 mm. Liver: The hepatic echotexture is mildly increased. There is no focal mass or ductal dilation. The surface contour of the liver is normal. IVC: No abnormality visualized. Pancreas: Visualized portion unremarkable. Spleen: Size and appearance within normal limits. Right Kidney: Length: 10.7 cm. Echogenicity within normal limits. No mass or hydronephrosis visualized. Left Kidney: Length: 9.6 cm. There is a shadowing midpole stone measuring approximately 8 mm in diameter. There is no hydronephrosis. There may be a duplex collecting system. Abdominal aorta: No aneurysm visualized. Other findings: There is no ascites. IMPRESSION: 1. Probable fatty infiltrative change of the liver. The common bile duct and pancreas are unremarkable. The gallbladder is surgically absent. 2. 8 mm nonobstructing mid pole kidney stone on the left. 3. No acute intra-abdominal  abnormality is observed. Electronically Signed   By: David  Martinique M.D.   On: 04/13/2016 12:11       Assessment & Plan:   Problem List Items Addressed This Visit    Anxiety    Refer to psychiatry as outlined.        Relevant Orders   Ambulatory referral to Psychiatry   Depression    Increased family stressors as outlined.  On prozac.  Feels prozac works well for her.  Uses xanax.  Do not want to increase xanax more.  Discussed referral to psychiatry.  She is in agreement.  Referral placed.        Relevant Orders   Ambulatory referral to Psychiatry   Environmental allergies    Stable on current regimen.        Hypercholesterolemia    Recent cholesterol check elevated.  Discussed further treatments.  Wanted to increase her cholesterol medication.  She declines.  Continue diet and exercise.  She desires no further medication.        Relevant Medications   hydrochlorothiazide (MICROZIDE) 12.5 MG capsule   Hypertension    Blood pressure under good control.  Continue same medication regimen.  Follow pressures.  Follow metabolic panel.        Relevant Medications   hydrochlorothiazide (MICROZIDE) 12.5 MG capsule   Nausea without vomiting    On dexilant.  Nausea better.  Saw GI.        Other Visit Diagnoses    B12 deficiency    -  Primary   Relevant Medications   cyanocobalamin ((VITAMIN B-12)) injection 1,000 mcg (Completed)   Hypokalemia       Relevant Orders   Potassium (Completed)       Einar Pheasant, MD

## 2016-08-27 ENCOUNTER — Other Ambulatory Visit: Payer: Self-pay | Admitting: Internal Medicine

## 2016-08-27 DIAGNOSIS — E876 Hypokalemia: Secondary | ICD-10-CM

## 2016-08-27 MED ORDER — POTASSIUM CHLORIDE ER 10 MEQ PO TBCR: 10.0000 meq | EXTENDED_RELEASE_TABLET | Freq: Every day | ORAL | 0 refills | Status: DC

## 2016-08-29 ENCOUNTER — Encounter: Payer: Self-pay | Admitting: Internal Medicine

## 2016-08-29 NOTE — Assessment & Plan Note (Signed)
Stable on current regimen   

## 2016-08-29 NOTE — Assessment & Plan Note (Signed)
Recent cholesterol check elevated.  Discussed further treatments.  Wanted to increase her cholesterol medication.  She declines.  Continue diet and exercise.  She desires no further medication.

## 2016-08-29 NOTE — Assessment & Plan Note (Signed)
Increased family stressors as outlined.  On prozac.  Feels prozac works well for her.  Uses xanax.  Do not want to increase xanax more.  Discussed referral to psychiatry.  She is in agreement.  Referral placed.

## 2016-08-29 NOTE — Assessment & Plan Note (Signed)
Blood pressure under good control.  Continue same medication regimen.  Follow pressures.  Follow metabolic panel.   

## 2016-08-29 NOTE — Assessment & Plan Note (Signed)
On dexilant.  Nausea better.  Saw GI.

## 2016-08-29 NOTE — Assessment & Plan Note (Signed)
Refer to psychiatry as outlined.

## 2016-09-08 ENCOUNTER — Other Ambulatory Visit: Payer: Self-pay | Admitting: Internal Medicine

## 2016-09-09 NOTE — Telephone Encounter (Signed)
Last filled 08/11/16 #60

## 2016-09-10 ENCOUNTER — Other Ambulatory Visit (INDEPENDENT_AMBULATORY_CARE_PROVIDER_SITE_OTHER): Payer: 59

## 2016-09-10 ENCOUNTER — Other Ambulatory Visit: Payer: Self-pay | Admitting: Internal Medicine

## 2016-09-10 DIAGNOSIS — E876 Hypokalemia: Secondary | ICD-10-CM

## 2016-09-10 LAB — POTASSIUM: Potassium: 3.9 mEq/L (ref 3.5–5.1)

## 2016-09-10 NOTE — Progress Notes (Signed)
Order placed for f/u potassium.  

## 2016-09-10 NOTE — Telephone Encounter (Signed)
faxed

## 2016-09-13 ENCOUNTER — Other Ambulatory Visit: Payer: 59

## 2016-09-15 ENCOUNTER — Other Ambulatory Visit: Payer: Self-pay | Admitting: Physical Medicine and Rehabilitation

## 2016-09-15 DIAGNOSIS — M5416 Radiculopathy, lumbar region: Secondary | ICD-10-CM

## 2016-09-20 ENCOUNTER — Other Ambulatory Visit: Payer: 59

## 2016-09-21 ENCOUNTER — Other Ambulatory Visit (INDEPENDENT_AMBULATORY_CARE_PROVIDER_SITE_OTHER): Payer: 59

## 2016-09-21 DIAGNOSIS — E876 Hypokalemia: Secondary | ICD-10-CM | POA: Diagnosis not present

## 2016-09-21 LAB — POTASSIUM: POTASSIUM: 3.9 meq/L (ref 3.5–5.1)

## 2016-09-22 ENCOUNTER — Other Ambulatory Visit: Payer: Self-pay | Admitting: Internal Medicine

## 2016-09-28 ENCOUNTER — Ambulatory Visit: Payer: 59

## 2016-09-29 ENCOUNTER — Ambulatory Visit: Payer: 59

## 2016-10-08 ENCOUNTER — Other Ambulatory Visit: Payer: Self-pay

## 2016-10-08 MED ORDER — FLUOXETINE HCL 40 MG PO CAPS: ORAL_CAPSULE | ORAL | 5 refills | Status: DC

## 2016-10-23 ENCOUNTER — Other Ambulatory Visit: Payer: Self-pay | Admitting: Internal Medicine

## 2016-11-18 ENCOUNTER — Telehealth: Payer: Self-pay | Admitting: *Deleted

## 2016-11-18 NOTE — Telephone Encounter (Signed)
Received referral for initial lung cancer screening scan. Contacted patient and obtained smoking history,(former, quit 2012, ) as well as answering questions related to screening process. Patient denies signs of lung cancer such as weight loss or hemoptysis. Patient denies comorbidity that would prevent curative treatment if lung cancer were found. Patient is tentatively scheduled for shared decision making visit and CT scan on 12/14/16, pending insurance approval from business office.

## 2016-11-25 ENCOUNTER — Other Ambulatory Visit: Payer: Self-pay | Admitting: *Deleted

## 2016-11-25 DIAGNOSIS — Z87891 Personal history of nicotine dependence: Secondary | ICD-10-CM

## 2016-12-06 HISTORY — PX: LUMBAR FUSION: SHX111

## 2016-12-07 ENCOUNTER — Encounter: Payer: 59 | Admitting: Internal Medicine

## 2016-12-09 ENCOUNTER — Other Ambulatory Visit: Payer: Self-pay | Admitting: Internal Medicine

## 2016-12-09 NOTE — Telephone Encounter (Signed)
Please advise on refills. Last OV 08/17/16. She has an appointment 12/15/2016

## 2016-12-09 NOTE — Telephone Encounter (Signed)
ok'd rx for protoinix #60 with one refill, xanax #60 with no refills and phenergan #20 with no refills.

## 2016-12-10 NOTE — Telephone Encounter (Signed)
Faxed to [facility]

## 2016-12-14 ENCOUNTER — Encounter: Payer: Self-pay | Admitting: Oncology

## 2016-12-14 ENCOUNTER — Inpatient Hospital Stay: Payer: 59 | Attending: Oncology | Admitting: Oncology

## 2016-12-14 ENCOUNTER — Ambulatory Visit
Admission: RE | Admit: 2016-12-14 | Discharge: 2016-12-14 | Disposition: A | Discharge status: Home / Self Care | Payer: BLUE CROSS/BLUE SHIELD | Source: Ambulatory Visit | Attending: Oncology | Admitting: Oncology

## 2016-12-14 DIAGNOSIS — I700 Atherosclerosis of aorta: Secondary | ICD-10-CM | POA: Diagnosis not present

## 2016-12-14 DIAGNOSIS — Z87891 Personal history of nicotine dependence: Secondary | ICD-10-CM | POA: Diagnosis present

## 2016-12-14 DIAGNOSIS — Z122 Encounter for screening for malignant neoplasm of respiratory organs: Secondary | ICD-10-CM

## 2016-12-15 ENCOUNTER — Telehealth: Payer: Self-pay | Admitting: *Deleted

## 2016-12-15 ENCOUNTER — Ambulatory Visit (INDEPENDENT_AMBULATORY_CARE_PROVIDER_SITE_OTHER): Payer: BLUE CROSS/BLUE SHIELD | Admitting: Internal Medicine

## 2016-12-15 ENCOUNTER — Encounter: Payer: Self-pay | Admitting: Internal Medicine

## 2016-12-15 VITALS — BP 112/80 | HR 87 | Wt 180.0 lb

## 2016-12-15 DIAGNOSIS — E7800 Pure hypercholesterolemia, unspecified: Secondary | ICD-10-CM | POA: Diagnosis not present

## 2016-12-15 DIAGNOSIS — E538 Deficiency of other specified B group vitamins: Secondary | ICD-10-CM | POA: Diagnosis not present

## 2016-12-15 DIAGNOSIS — Z0000 Encounter for general adult medical examination without abnormal findings: Secondary | ICD-10-CM

## 2016-12-15 DIAGNOSIS — Z860100 Personal history of colon polyps, unspecified: Secondary | ICD-10-CM

## 2016-12-15 DIAGNOSIS — I10 Essential (primary) hypertension: Secondary | ICD-10-CM

## 2016-12-15 DIAGNOSIS — M549 Dorsalgia, unspecified: Secondary | ICD-10-CM

## 2016-12-15 DIAGNOSIS — F329 Major depressive disorder, single episode, unspecified: Secondary | ICD-10-CM | POA: Diagnosis not present

## 2016-12-15 DIAGNOSIS — R739 Hyperglycemia, unspecified: Secondary | ICD-10-CM

## 2016-12-15 DIAGNOSIS — F32A Depression, unspecified: Secondary | ICD-10-CM

## 2016-12-15 DIAGNOSIS — G8929 Other chronic pain: Secondary | ICD-10-CM | POA: Diagnosis not present

## 2016-12-15 DIAGNOSIS — Z87891 Personal history of nicotine dependence: Secondary | ICD-10-CM | POA: Insufficient documentation

## 2016-12-15 MED ORDER — CYANOCOBALAMIN 1000 MCG/ML IJ SOLN
1000.0000 ug | Freq: Once | INTRAMUSCULAR | Status: AC
  Administered 2016-12-15: 1000 ug via INTRAMUSCULAR

## 2016-12-15 NOTE — Progress Notes (Signed)
In accordance with CMS guidelines, patient has met eligibility criteria including age, absence of signs or symptoms of lung cancer.  Social History  Substance Use Topics  . Smoking status: Former Smoker    Packs/day: 1.00    Years: 37.00    Quit date: 2012  . Smokeless tobacco: Never Used  . Alcohol use 0.6 oz/week    1 Glasses of wine per week     A shared decision-making session was conducted prior to the performance of CT scan. This includes one or more decision aids, includes benefits and harms of screening, follow-up diagnostic testing, over-diagnosis, false positive rate, and total radiation exposure.  Counseling on the importance of adherence to annual lung cancer LDCT screening, impact of co-morbidities, and ability or willingness to undergo diagnosis and treatment is imperative for compliance of the program.  Counseling on the importance of continued smoking cessation for former smokers; the importance of smoking cessation for current smokers, and information about tobacco cessation interventions have been given to patient including [place] and [facility] quit [place] programs.  Written order for lung cancer screening with LDCT has been given to the patient and any and all questions have been answered to the best of my abilities.   Yearly follow up will be coordinated by Burgess Estelle, Thoracic Navigator.

## 2016-12-15 NOTE — Telephone Encounter (Signed)
Notified patient of LDCT lung cancer screening results with recommendation for 12 month follow up imaging. Also notified of incidental finding noted below and encouraged to discuss with PCP. Patient verbalizes understanding. A copy of this note will be forwarded to PCP via Epic.   IMPRESSION: 1. Lung-RADS Category 1, negative. Continue annual screening with low-dose chest CT without contrast in 12 months. 2.  Aortic atherosclerosis (ICD10-170.0).

## 2016-12-15 NOTE — Assessment & Plan Note (Addendum)
Physical today 12/15/16.  Mammogram 03/12/16 - Birads I.  Colonoscopy 09/2015.  Recommended f/u in five years.

## 2016-12-15 NOTE — Progress Notes (Signed)
Patient ID: Angel Riddle, female   DOB: February 12, 1956, 61 y.o.   MRN: [id number]   Subjective:    Patient ID: Angel Riddle, female    DOB: 16-Jan-1956, 61 y.o.   MRN: [id number]  HPI  Patient here for her physical exam.  Increased stress recently.  Overall handling thing relatively well.  Discussed with her today.  She does not feel she needs anything more at this time.  Uses xanax.  No chest pain.  Breathing stable.  No acid reflux.  No abdominal pain.  Bowels stable.  Had colonoscopy 09-2015.  Recommended f/u in five years.  Had screening CT scan today.  Overall feels better.     Past Medical History:  Diagnosis Date  . Allergy   . Anxiety   . Arthritis   . Chronic back pain    degenerative disc disease  . Degenerative disc disease, lumbar   . Depression   . Gallstones   . GERD (gastroesophageal reflux disease)   . Heart murmur    followed by PCP  . Hx of colonic polyp   . Hx: UTI (urinary tract infection)   . Hypercholesterolemia   . Hypertension   . Panic attacks    H/O  . Wears dentures    partial upper and lower   Past Surgical History:  Procedure Laterality Date  . ABDOMINAL HYSTERECTOMY  1992   partial  . CATARACT EXTRACTION Bilateral [id number]  . CHOLECYSTECTOMY  04-19-14  . COLONOSCOPY  07/02/11   DR. Byrnett  . COLONOSCOPY WITH PROPOFOL N/A 09/25/2015   Procedure: COLONOSCOPY WITH PROPOFOL;  Surgeon: Lucilla Lame, MD;  Location: [facility];  Service: Endoscopy;  Laterality: N/A;  . FOOT SURGERY Right 2009  . MOUTH SURGERY    . POLYPECTOMY  09/25/2015   Procedure: POLYPECTOMY;  Surgeon: Lucilla Lame, MD;  Location: [facility];  Service: Endoscopy;;  . skin surgery Left 08/27/13   Basal cell removal-left nostril  . TONSILLECTOMY AND ADENOIDECTOMY  1969   Family History  Problem Relation Age of Onset  . Hyperlipidemia Mother   . Hypertension Mother   . Alcohol abuse Father   . Hyperlipidemia Father   . Heart disease Father   . Diabetes  Father   . Hyperlipidemia Sister   . Lung cancer Brother   . Hyperlipidemia Brother    Social History   Social History  . Marital status: Married    Spouse name: N/A  . Number of children: 2  . Years of education: N/A   Social History Main Topics  . Smoking status: Former Smoker    Packs/day: 1.00    Years: 37.00    Quit date: 2012  . Smokeless tobacco: Never Used  . Alcohol use 0.6 oz/week    1 Glasses of wine per week  . Drug use: No  . Sexual activity: Not Asked   Other Topics Concern  . None   Social History Narrative  . None    Outpatient Encounter Prescriptions as of 12/15/2016  Medication Sig  . ALPRAZolam (XANAX) 1 MG tablet TAKE (1) TABLET TWICE A DAY.  Marland Kitchen FLUoxetine (PROZAC) 40 MG capsule Take 1 capsule (40 mg total) by mouth daily.  . fluticasone (FLONASE) 50 MCG/ACT nasal spray Place 1 spray into both nostrils. Reported on 11/25/2015  . gabapentin (NEURONTIN) 100 MG capsule Take 200 mg by mouth at bedtime.  . hydrochlorothiazide (MICROZIDE) 12.5 MG capsule Take 1 capsule (12.5 mg total) by mouth daily.  Marland Kitchen HYDROcodone-acetaminophen (  NORCO/VICODIN) 5-325 MG per tablet Take 5-325 tablets by mouth 2 (two) times daily as needed.  . hydrocortisone (ANUSOL-HC) 25 MG suppository Place 1 suppository (25 mg total) rectally 2 (two) times daily.  . meloxicam (MOBIC) 15 MG tablet Take 1 tablet (15 mg total) by mouth daily.  . pantoprazole (PROTONIX) 40 MG tablet Take 1 tablet (40 mg total) by mouth daily.  . potassium chloride (K-DUR) 10 MEQ tablet Take 1 tablet (10 mEq total) by mouth daily.  . pravastatin (PRAVACHOL) 10 MG tablet Take 1 tablet (10 mg total) by mouth daily.  . promethazine (PHENERGAN) 12.5 MG tablet Take 1 tablet (12.5 mg total) by mouth 2 (two) times daily as needed for nausea or vomiting.  . Pseudoeph-Doxylamine-DM-APAP (NYQUIL PO) Take by mouth. Reported on 05/18/2016  . Pseudoephedrine HCl 240 MG TB24 Take by mouth as needed. Reported on 05/18/2016  .  [DISCONTINUED] ranitidine (ZANTAC) 75 MG tablet Take 75 mg by mouth daily. Reported on 11/25/2015   Facility-Administered Encounter Medications as of 12/15/2016  Medication  . cyanocobalamin ((VITAMIN B-12)) injection 1,000 mcg  . [COMPLETED] cyanocobalamin ((VITAMIN B-12)) injection 1,000 mcg    Review of Systems  Constitutional: Negative for appetite change and unexpected weight change.  HENT: Negative for congestion and sinus pressure.   Eyes: Negative for pain and visual disturbance.  Respiratory: Negative for cough, chest tightness and shortness of breath.   Cardiovascular: Negative for chest pain, palpitations and leg swelling.  Gastrointestinal: Negative for abdominal pain, diarrhea, nausea and vomiting.  Genitourinary: Negative for difficulty urinating and dysuria.  Musculoskeletal: Negative for joint swelling and myalgias.       Back is doing better.  Seeing Dr Sharlet Salina.    Skin: Negative for color change and rash.  Neurological: Negative for dizziness, light-headedness and headaches.  Hematological: Negative for adenopathy. Does not bruise/bleed easily.  Psychiatric/Behavioral: Negative for agitation and dysphoric mood.       Objective:     Blood pressure rechecked by me:  130/82  Physical Exam  Constitutional: She is oriented to person, place, and time. She appears well-developed and well-nourished. No distress.  HENT:  Nose: Nose normal.  Mouth/Throat: Oropharynx is clear and moist.  Eyes: Right eye exhibits no discharge. Left eye exhibits no discharge. No scleral icterus.  Neck: Neck supple. No thyromegaly present.  Cardiovascular: Normal rate and regular rhythm.   Pulmonary/Chest: Breath sounds normal. No accessory muscle usage. No tachypnea. No respiratory distress. She has no decreased breath sounds. She has no wheezes. She has no rhonchi. Right breast exhibits no inverted nipple, no mass, no nipple discharge and no tenderness (no axillary adenopathy). Left breast  exhibits no inverted nipple, no mass, no nipple discharge and no tenderness (no axilarry adenopathy).  Abdominal: Soft. Bowel sounds are normal. There is no tenderness.  Musculoskeletal: She exhibits no edema or tenderness.  Lymphadenopathy:    She has no cervical adenopathy.  Neurological: She is alert and oriented to person, place, and time.  Skin: Skin is warm. No rash noted. No erythema.  Psychiatric: She has a normal mood and affect. Her behavior is normal.    BP 112/80   Pulse 87   Wt 180 lb 0.4 oz (81.7 kg)   SpO2 97%   BMI 29.96 kg/m  Wt Readings from Last 3 Encounters:  12/15/16 180 lb 0.4 oz (81.7 kg)  12/14/16 180 lb (81.6 kg)  08/26/16 185 lb 9.6 oz (84.2 kg)     Lab Results  Component Value Date  WBC 6.1 08/16/2016   HGB 13.3 08/16/2016   HCT 38.8 08/16/2016   PLT 289.0 08/16/2016   GLUCOSE 98 08/16/2016   CHOL 251 (H) 08/16/2016   TRIG 213.0 (H) 08/16/2016   HDL 47.70 08/16/2016   LDLDIRECT 158.0 08/16/2016   LDLCALC 218 (H) 03/14/2014   ALT 11 08/16/2016   AST 22 08/16/2016   NA 136 08/16/2016   K 3.9 09/21/2016   CL 100 08/16/2016   CREATININE 0.76 08/16/2016   BUN 14 08/16/2016   CO2 29 08/16/2016   TSH 1.06 08/16/2016    Ct Chest Lung Cancer Screening Low Dose Wo Contrast  Result Date: 12/15/2016 CLINICAL DATA:  Former smoker, quit 5 years ago, 46 pack-year history, lung cancer screening. EXAM: CT CHEST WITHOUT CONTRAST LOW-DOSE FOR LUNG CANCER SCREENING TECHNIQUE: Multidetector CT imaging of the chest was performed following the standard protocol without IV contrast. COMPARISON:  11/19/2013. FINDINGS: Cardiovascular: Mild atherosclerotic calcification of the arterial vasculature. Heart size normal. No pericardial effusion. Mediastinum/Nodes: No pathologically enlarged mediastinal or axillary lymph nodes. Hilar regions are difficult to definitively evaluate without IV contrast. There may be thickening of the distal esophageal wall, which can be seen  with gastroesophageal reflux disease. Lungs/Pleura: Mild centrilobular emphysema. No worrisome pulmonary nodules. No pleural fluid. Airway is unremarkable. Upper Abdomen: Visualized portions of the liver, adrenal glands, right kidney, spleen, pancreas, stomach and bowel are grossly unremarkable. Musculoskeletal: No worrisome lytic or sclerotic lesions. IMPRESSION: 1. Lung-RADS Category 1, negative. Continue annual screening with low-dose chest CT without contrast in 12 months. 2.  Aortic atherosclerosis (ICD10-170.0). Electronically Signed   By: Lorin Picket M.D.   On: 12/15/2016 08:35       Assessment & Plan:   Problem List Items Addressed This Visit    Chronic back pain    Doing better.  Seeing Dr Sharlet Salina.       Depression    Increased family stress as outlined.  On prozac.  Uses xanax.  Overall she feels she is handling things relatively well.  Discussed with her today.  Follow.  She desires no further intervention.       Health care maintenance    Physical today 12/15/16.  Mammogram 03/12/16 - Birads I.  Colonoscopy 09/2015.  Recommended f/u in five years.        History of colonic polyps    Colonoscopy 09/25/15 - sigmoid polyp, anal fissure and non bleeding internal hemorrhoids.  Recommended f/u colonoscopy in five years.        Hypercholesterolemia    On pravastatin.  Low cholesterol diet and exercise.  Discussed labs.  Discussed increasing pravastatin.  She declines.  Follow.        Relevant Orders   Lipid panel   Hepatic function panel   Hypertension    Blood pressure under good control.  Continue same medication regimen.  Follow pressures.  Follow metabolic panel.        Relevant Orders   Basic metabolic panel    Other Visit Diagnoses    B12 deficiency    -  Primary   Relevant Medications   cyanocobalamin ((VITAMIN B-12)) injection 1,000 mcg (Completed)   Hyperglycemia       Relevant Orders   Hemoglobin A1c       Einar Pheasant, MD

## 2016-12-20 ENCOUNTER — Encounter: Payer: Self-pay | Admitting: Internal Medicine

## 2016-12-20 NOTE — Assessment & Plan Note (Signed)
Doing better.  Seeing Dr Sharlet Salina.

## 2016-12-20 NOTE — Assessment & Plan Note (Signed)
Colonoscopy 09/25/15 - sigmoid polyp, anal fissure and non bleeding internal hemorrhoids.  Recommended f/u colonoscopy in five years.

## 2016-12-20 NOTE — Assessment & Plan Note (Signed)
Blood pressure under good control.  Continue same medication regimen.  Follow pressures.  Follow metabolic panel.   

## 2016-12-20 NOTE — Assessment & Plan Note (Signed)
Increased family stress as outlined.  On prozac.  Uses xanax.  Overall she feels she is handling things relatively well.  Discussed with her today.  Follow.  She desires no further intervention.

## 2016-12-20 NOTE — Assessment & Plan Note (Signed)
On pravastatin.  Low cholesterol diet and exercise.  Discussed labs.  Discussed increasing pravastatin.  She declines.  Follow.

## 2016-12-21 ENCOUNTER — Other Ambulatory Visit: Payer: Self-pay | Admitting: Internal Medicine

## 2017-01-06 ENCOUNTER — Telehealth: Payer: Self-pay | Admitting: Internal Medicine

## 2017-01-06 NOTE — Telephone Encounter (Signed)
hydrochlorothiazide (MICROZIDE) 12.5 MG capsule take one capsule by mouth daily. Qty 30

## 2017-01-07 MED ORDER — HYDROCHLOROTHIAZIDE 12.5 MG PO CAPS: 12.5000 mg | ORAL_CAPSULE | Freq: Every day | ORAL | 3 refills | Status: DC

## 2017-01-07 NOTE — Telephone Encounter (Signed)
Refill faxed over with refills to last until follow up on 04-13-17

## 2017-01-19 ENCOUNTER — Ambulatory Visit: Payer: BLUE CROSS/BLUE SHIELD

## 2017-01-20 ENCOUNTER — Other Ambulatory Visit: Payer: Self-pay | Admitting: Internal Medicine

## 2017-01-20 NOTE — Telephone Encounter (Signed)
Last OV 12/15/16 last filled 12/09/2016 60 0rf

## 2017-01-21 ENCOUNTER — Other Ambulatory Visit: Payer: Self-pay | Admitting: Internal Medicine

## 2017-01-21 NOTE — Telephone Encounter (Signed)
fyi

## 2017-01-21 NOTE — Telephone Encounter (Signed)
Faxed to pharmacy

## 2017-02-03 ENCOUNTER — Ambulatory Visit (INDEPENDENT_AMBULATORY_CARE_PROVIDER_SITE_OTHER): Payer: BLUE CROSS/BLUE SHIELD

## 2017-02-03 DIAGNOSIS — E538 Deficiency of other specified B group vitamins: Secondary | ICD-10-CM | POA: Diagnosis not present

## 2017-02-03 MED ORDER — CYANOCOBALAMIN 1000 MCG/ML IJ SOLN
1000.0000 ug | Freq: Once | INTRAMUSCULAR | Status: AC
  Administered 2017-02-03: 1000 ug via INTRAMUSCULAR

## 2017-02-03 MED ORDER — CYANOCOBALAMIN 1000 MCG/ML IJ SOLN: 1000.0000 ug | Freq: Once | INTRAMUSCULAR | Status: DC

## 2017-02-03 NOTE — Progress Notes (Addendum)
Patient comes in for B 12 injection.  Injected right deltoid.  Patient tolerated injection well.   Reviewed.  Dr Scott 

## 2017-02-08 ENCOUNTER — Other Ambulatory Visit: Payer: Self-pay | Admitting: Physical Medicine and Rehabilitation

## 2017-02-08 DIAGNOSIS — M5416 Radiculopathy, lumbar region: Secondary | ICD-10-CM

## 2017-02-08 DIAGNOSIS — M5136 Other intervertebral disc degeneration, lumbar region: Secondary | ICD-10-CM

## 2017-02-08 DIAGNOSIS — M48062 Spinal stenosis, lumbar region with neurogenic claudication: Secondary | ICD-10-CM

## 2017-02-16 ENCOUNTER — Other Ambulatory Visit: Payer: BLUE CROSS/BLUE SHIELD

## 2017-02-22 ENCOUNTER — Ambulatory Visit
Admission: RE | Admit: 2017-02-22 | Discharge: 2017-02-22 | Disposition: A | Discharge status: Home / Self Care | Payer: BLUE CROSS/BLUE SHIELD | Source: Ambulatory Visit | Attending: Physical Medicine and Rehabilitation | Admitting: Physical Medicine and Rehabilitation

## 2017-02-22 ENCOUNTER — Other Ambulatory Visit: Payer: Self-pay | Admitting: Internal Medicine

## 2017-02-22 DIAGNOSIS — M5416 Radiculopathy, lumbar region: Secondary | ICD-10-CM

## 2017-02-22 DIAGNOSIS — M48062 Spinal stenosis, lumbar region with neurogenic claudication: Secondary | ICD-10-CM | POA: Insufficient documentation

## 2017-02-22 DIAGNOSIS — M5136 Other intervertebral disc degeneration, lumbar region: Secondary | ICD-10-CM | POA: Insufficient documentation

## 2017-03-04 ENCOUNTER — Other Ambulatory Visit: Payer: Self-pay | Admitting: Internal Medicine

## 2017-03-07 NOTE — Telephone Encounter (Signed)
Last OV was 12/15/16, last refill was 01/21/17 #60 with twice daily dosing.  Please advise, thanks

## 2017-03-10 ENCOUNTER — Other Ambulatory Visit: Payer: Self-pay | Admitting: Internal Medicine

## 2017-04-05 ENCOUNTER — Other Ambulatory Visit: Payer: Self-pay | Admitting: Internal Medicine

## 2017-04-05 NOTE — Telephone Encounter (Signed)
Medication: Prozac 40 mg  Directions:1 po qd #30 Last given: 10-08-16 Number refills: 5 Last o/v: 12/15/16 Follow up: 04-13-17

## 2017-04-06 NOTE — Telephone Encounter (Signed)
rx ok'd for prozac #30 with 3 refills.

## 2017-04-11 ENCOUNTER — Other Ambulatory Visit (INDEPENDENT_AMBULATORY_CARE_PROVIDER_SITE_OTHER): Payer: BLUE CROSS/BLUE SHIELD

## 2017-04-11 DIAGNOSIS — E7800 Pure hypercholesterolemia, unspecified: Secondary | ICD-10-CM

## 2017-04-11 DIAGNOSIS — R739 Hyperglycemia, unspecified: Secondary | ICD-10-CM | POA: Diagnosis not present

## 2017-04-11 DIAGNOSIS — I10 Essential (primary) hypertension: Secondary | ICD-10-CM | POA: Diagnosis not present

## 2017-04-11 DIAGNOSIS — R7989 Other specified abnormal findings of blood chemistry: Secondary | ICD-10-CM | POA: Diagnosis not present

## 2017-04-11 LAB — HEPATIC FUNCTION PANEL
ALBUMIN: 4.2 g/dL (ref 3.5–5.2)
ALT: 9 U/L (ref 0–35)
AST: 19 U/L (ref 0–37)
Alkaline Phosphatase: 87 U/L (ref 39–117)
Bilirubin, Direct: 0 mg/dL (ref 0.0–0.3)
Total Bilirubin: 0.5 mg/dL (ref 0.2–1.2)
Total Protein: 6.9 g/dL (ref 6.0–8.3)

## 2017-04-11 LAB — BASIC METABOLIC PANEL
BUN: 15 mg/dL (ref 6–23)
CALCIUM: 9.5 mg/dL (ref 8.4–10.5)
CHLORIDE: 103 meq/L (ref 96–112)
CO2: 29 meq/L (ref 19–32)
Creatinine, Ser: 0.82 mg/dL (ref 0.40–1.20)
GFR: 75.28 mL/min (ref >60.00)
Glucose, Bld: 95 mg/dL (ref 70–99)
POTASSIUM: 3.7 meq/L (ref 3.5–5.1)
SODIUM: 139 meq/L (ref 135–145)

## 2017-04-11 LAB — LIPID PANEL
CHOL/HDL RATIO: 6
CHOLESTEROL: 291 mg/dL — AB (ref 0–200)
HDL: 48.9 mg/dL (ref >39.00)

## 2017-04-11 LAB — HEMOGLOBIN A1C: Hgb A1c MFr Bld: 6.1 % (ref 4.6–6.5)

## 2017-04-11 LAB — LDL CHOLESTEROL, DIRECT: LDL DIRECT: 158 mg/dL

## 2017-04-13 ENCOUNTER — Encounter: Payer: Self-pay | Admitting: Internal Medicine

## 2017-04-13 ENCOUNTER — Ambulatory Visit (INDEPENDENT_AMBULATORY_CARE_PROVIDER_SITE_OTHER): Payer: BLUE CROSS/BLUE SHIELD | Admitting: Internal Medicine

## 2017-04-13 VITALS — BP 114/86 | HR 92 | Temp 99.4°F | Wt 189.8 lb

## 2017-04-13 DIAGNOSIS — G8929 Other chronic pain: Secondary | ICD-10-CM

## 2017-04-13 DIAGNOSIS — I10 Essential (primary) hypertension: Secondary | ICD-10-CM

## 2017-04-13 DIAGNOSIS — M549 Dorsalgia, unspecified: Secondary | ICD-10-CM | POA: Diagnosis not present

## 2017-04-13 DIAGNOSIS — E7800 Pure hypercholesterolemia, unspecified: Secondary | ICD-10-CM | POA: Diagnosis not present

## 2017-04-13 DIAGNOSIS — R739 Hyperglycemia, unspecified: Secondary | ICD-10-CM

## 2017-04-13 DIAGNOSIS — E538 Deficiency of other specified B group vitamins: Secondary | ICD-10-CM | POA: Diagnosis not present

## 2017-04-13 DIAGNOSIS — F32A Depression, unspecified: Secondary | ICD-10-CM

## 2017-04-13 DIAGNOSIS — F419 Anxiety disorder, unspecified: Secondary | ICD-10-CM | POA: Diagnosis not present

## 2017-04-13 DIAGNOSIS — F329 Major depressive disorder, single episode, unspecified: Secondary | ICD-10-CM | POA: Diagnosis not present

## 2017-04-13 DIAGNOSIS — E669 Obesity, unspecified: Secondary | ICD-10-CM | POA: Diagnosis not present

## 2017-04-13 MED ORDER — CYANOCOBALAMIN 1000 MCG/ML IJ SOLN
1000.0000 ug | Freq: Once | INTRAMUSCULAR | Status: AC
  Administered 2017-04-13: 1000 ug via INTRAMUSCULAR

## 2017-04-13 NOTE — Progress Notes (Signed)
Patient ID: Angel Riddle, female   DOB: 1956/07/02, 61 y.o.   MRN: [id number]   Subjective:    Patient ID: Angel Riddle, female    DOB: [birth year], 61 y.o.   MRN: [id number]  HPI  Patient here for a scheduled follow up.  She reports continued increased stress with family health issues, etc.  She has continued to have chronic back pain.  Limits her activity and exercise.  Has been seeing Dr Sharlet Salina.  Planning to see Dr Cari Caraway.  Referred by Dr Sharlet Salina.  States that standing for more than 30 minutes aggravates.  No chest pain.  Breathing stable.  Discussed lab results.  Triglycerides 435 and LDL 158.  Has had concerns about taking statin medication and increasing the dose.  Discussed diet.  She requested to start a medication for weight loss.  States one of her friends told her about adipex.  We discussed medication and risks of medication.  Discussed possible side effects.  She has been eating out more.  Frustrated with her weight and not being able to exercise as much.  No abdominal pain.  Bowels moving.     Past Medical History:  Diagnosis Date  . Allergy   . Anxiety   . Arthritis   . Chronic back pain    degenerative disc disease  . Degenerative disc disease, lumbar   . Depression   . Gallstones   . GERD (gastroesophageal reflux disease)   . Heart murmur    followed by PCP  . Hx of colonic polyp   . Hx: UTI (urinary tract infection)   . Hypercholesterolemia   . Hypertension   . Panic attacks    H/O  . Wears dentures    partial upper and lower   Past Surgical History:  Procedure Laterality Date  . ABDOMINAL HYSTERECTOMY  1992   partial  . CATARACT EXTRACTION Bilateral [id number]  . CHOLECYSTECTOMY  04-19-14  . COLONOSCOPY  07/02/11   DR. Byrnett  . COLONOSCOPY WITH PROPOFOL N/A 09/25/2015   Procedure: COLONOSCOPY WITH PROPOFOL;  Surgeon: Lucilla Lame, MD;  Location: [facility];  Service: Endoscopy;  Laterality: N/A;  . FOOT SURGERY Right 2009  . MOUTH SURGERY     . POLYPECTOMY  09/25/2015   Procedure: POLYPECTOMY;  Surgeon: Lucilla Lame, MD;  Location: [facility];  Service: Endoscopy;;  . skin surgery Left 08/27/13   Basal cell removal-left nostril  . TONSILLECTOMY AND ADENOIDECTOMY  1969   Family History  Problem Relation Age of Onset  . Hyperlipidemia Mother   . Hypertension Mother   . Alcohol abuse Father   . Hyperlipidemia Father   . Heart disease Father   . Diabetes Father   . Hyperlipidemia Sister   . Lung cancer Brother   . Hyperlipidemia Brother    Social History   Social History  . Marital status: Married    Spouse name: N/A  . Number of children: 2  . Years of education: N/A   Social History Main Topics  . Smoking status: Former Smoker    Packs/day: 1.00    Years: 37.00    Quit date: 2012  . Smokeless tobacco: Never Used  . Alcohol use 0.6 oz/week    1 Glasses of wine per week  . Drug use: No  . Sexual activity: Not Asked   Other Topics Concern  . None   Social History Narrative  . None    Outpatient Encounter Prescriptions as of 04/13/2017  Medication Sig  .  ALPRAZolam (XANAX) 1 MG tablet TAKE (1) TABLET TWICE A DAY.  Marland Kitchen FLUoxetine (PROZAC) 40 MG capsule Take 1 capsule (40 mg total) by mouth daily.  . fluticasone (FLONASE) 50 MCG/ACT nasal spray Place 1 spray into both nostrils. Reported on 11/25/2015  . gabapentin (NEURONTIN) 100 MG capsule Take 200 mg by mouth at bedtime.  . hydrochlorothiazide (MICROZIDE) 12.5 MG capsule Take 1 capsule (12.5 mg total) by mouth daily.  Marland Kitchen HYDROcodone-acetaminophen (NORCO/VICODIN) 5-325 MG per tablet Take 5-325 tablets by mouth 2 (two) times daily as needed.  . hydrocortisone (ANUSOL-HC) 25 MG suppository Place 1 suppository (25 mg total) rectally 2 (two) times daily.  . meloxicam (MOBIC) 15 MG tablet Take 1 tablet (15 mg total) by mouth daily.  . pantoprazole (PROTONIX) 40 MG tablet Take 1 tablet (40 mg total) by mouth daily.  . potassium chloride (K-DUR) 10 MEQ  tablet Take 1 tablet (10 mEq total) by mouth daily.  . promethazine (PHENERGAN) 12.5 MG tablet Take 1 tablet (12.5 mg total) by mouth 2 (two) times daily as needed for nausea or vomiting.  . Pseudoeph-Doxylamine-DM-APAP (NYQUIL PO) Take by mouth. Reported on 05/18/2016  . Pseudoephedrine HCl 240 MG TB24 Take by mouth as needed. Reported on 05/18/2016  . tiZANidine (ZANAFLEX) 4 MG tablet 1/2-1 po bid prn  . [DISCONTINUED] pravastatin (PRAVACHOL) 10 MG tablet Take 1 tablet (10 mg total) by mouth daily.   Facility-Administered Encounter Medications as of 04/13/2017  Medication  . cyanocobalamin ((VITAMIN B-12)) injection 1,000 mcg  . [COMPLETED] cyanocobalamin ((VITAMIN B-12)) injection 1,000 mcg    Review of Systems  Constitutional: Positive for fever. Negative for appetite change.  HENT: Negative for congestion and sinus pressure.   Respiratory: Negative for cough, chest tightness and shortness of breath.   Cardiovascular: Negative for chest pain, palpitations and leg swelling.  Gastrointestinal: Negative for abdominal pain, diarrhea, nausea and vomiting.  Genitourinary: Negative for difficulty urinating and dysuria.  Musculoskeletal: Positive for back pain. Negative for joint swelling.  Skin: Negative for color change and rash.  Neurological: Negative for dizziness, light-headedness and headaches.  Psychiatric/Behavioral: Negative for agitation and dysphoric mood.       Increased stress as outlined.         Objective:    Physical Exam  Constitutional: She appears well-developed and well-nourished. No distress.  HENT:  Nose: Nose normal.  Mouth/Throat: Oropharynx is clear and moist.  Neck: Neck supple. No thyromegaly present.  Cardiovascular: Normal rate and regular rhythm.   Pulmonary/Chest: Breath sounds normal. No respiratory distress. She has no wheezes.  Abdominal: Soft. Bowel sounds are normal. There is no tenderness.  Musculoskeletal: She exhibits no edema or tenderness.    Lymphadenopathy:    She has no cervical adenopathy.  Skin: No rash noted. No erythema.  Psychiatric: She has a normal mood and affect. Her behavior is normal.    BP 114/86   Pulse 92   Temp 99.4 F (37.4 C) (Oral)   Wt 189 lb 12.8 oz (86.1 kg)   SpO2 95%   BMI 31.58 kg/m  Wt Readings from Last 3 Encounters:  04/13/17 189 lb 12.8 oz (86.1 kg)  12/15/16 180 lb 0.4 oz (81.7 kg)  12/14/16 180 lb (81.6 kg)     Lab Results  Component Value Date   WBC 6.1 08/16/2016   HGB 13.3 08/16/2016   HCT 38.8 08/16/2016   PLT 289.0 08/16/2016   GLUCOSE 95 04/11/2017   CHOL 291 (H) 04/11/2017   TRIG (H) 04/11/2017  435.0 Triglyceride is over 400; calculations on Lipids are invalid.   HDL 48.90 04/11/2017   LDLDIRECT 158.0 04/11/2017   LDLCALC 218 (H) 03/14/2014   ALT 9 04/11/2017   AST 19 04/11/2017   NA 139 04/11/2017   K 3.7 04/11/2017   CL 103 04/11/2017   CREATININE 0.82 04/11/2017   BUN 15 04/11/2017   CO2 29 04/11/2017   TSH 1.06 08/16/2016   HGBA1C 6.1 04/11/2017    Mr Lumbar Spine Wo Contrast  Result Date: 02/22/2017 CLINICAL DATA:  Lumbar stenosis with neurogenic claudication. Lumbar radiculitis. EXAM: MRI LUMBAR SPINE WITHOUT CONTRAST TECHNIQUE: Multiplanar, multisequence MR imaging of the lumbar spine was performed. No intravenous contrast was administered. COMPARISON:  Lumbar MRI 04/02/2015 FINDINGS: Segmentation:  Normal. Alignment: Mild retrolisthesis L3-4 L4-5 unchanged from the prior study Vertebrae: Negative for fracture or mass. Hemangioma L3 vertebral body on the left is unchanged. Conus medullaris: Extends to the L1-2 level and appears normal. Paraspinal and other soft tissues: Negative Disc levels: L1-2: Mild disc degeneration and moderate facet degeneration without significant spinal stenosis. L2-3:  Mild disc and facet degeneration without significant stenosis L3-4: Mild retrolisthesis. Diffuse bulging of the disc and moderate facet hypertrophy. Mild spinal  stenosis. Moderate right foraminal encroachment. L4-5: Diffuse disc bulging and endplate spurring, right greater than left. Bilateral facet hypertrophy. Severe right foraminal encroachment with compression of the right L4 nerve root. Mild spinal stenosis. Mild left foraminal narrowing L5-S1:  Mild facet degeneration without stenosis IMPRESSION: Mild spinal stenosis at L3-4 with moderate right foraminal encroachment Mild spinal stenosis L4-5 with severe right foraminal encroachment and mild left foraminal narrowing. Electronically Signed   By: Franchot Gallo M.D.   On: 02/22/2017 14:15       Assessment & Plan:   Problem List Items Addressed This Visit    Anxiety    Taking xanax.  On prozac.  Discussed with her today.  Desires no further intervention.  Follow.        Chronic back pain    States is limiting her activity.  Sees Dr Sharlet Salina.  Being referred to Dr Cari Caraway (by Dr Harley Alto).        Relevant Medications   tiZANidine (ZANAFLEX) 4 MG tablet   Depression    Increased stress as outlined.  Discussed with her today.  On prozac.  Uses xanax.  Desires no further intervention.  Follow.        Hypercholesterolemia    Has been on pravastatin.  Hesitant to increase dose.  Triglycerides increased.  Has been eating out more.  Discussed low carb diet and exercise.  Follow lipid panel.        Relevant Orders   Hepatic function panel   Lipid panel   Hyperglycemia    Discussed low carb diet and exercise.  Follow met b and a1c.        Relevant Orders   Hemoglobin A1c   Hypertension    Blood pressure has been under reasonable control.  Same medication regimen.  Follow.        Relevant Orders   Basic metabolic panel   Obesity (BMI 30.0-34.9)    Discussed diet and exercise.  She is limited in the exercise she can do - secondary to her back.  Has been eating out more.  Discussed low carb diet and exercise.  She is eager to start a weight loss medication.  Discussed at length with her  today.  Would like to see her watching her diet and exercising  first.  Will look into options.  Follow.         Other Visit Diagnoses    B12 deficiency    -  Primary   Relevant Medications   cyanocobalamin ((VITAMIN B-12)) injection 1,000 mcg (Completed)   Other Relevant Orders   Vitamin B12       Einar Pheasant, MD

## 2017-04-15 ENCOUNTER — Other Ambulatory Visit: Payer: Self-pay | Admitting: Internal Medicine

## 2017-04-25 ENCOUNTER — Telehealth: Payer: Self-pay | Admitting: Internal Medicine

## 2017-04-25 ENCOUNTER — Encounter: Payer: Self-pay | Admitting: Internal Medicine

## 2017-04-25 DIAGNOSIS — Z6831 Body mass index (BMI) 31.0-31.9, adult: Secondary | ICD-10-CM | POA: Insufficient documentation

## 2017-04-25 DIAGNOSIS — R739 Hyperglycemia, unspecified: Secondary | ICD-10-CM | POA: Insufficient documentation

## 2017-04-25 NOTE — Assessment & Plan Note (Signed)
States is limiting her activity.  Sees Dr Sharlet Salina.  Being referred to Dr Cari Caraway (by Dr Harley Alto).

## 2017-04-25 NOTE — Assessment & Plan Note (Signed)
Discussed diet and exercise.  She is limited in the exercise she can do - secondary to her back.  Has been eating out more.  Discussed low carb diet and exercise.  She is eager to start a weight loss medication.  Discussed at length with her today.  Would like to see her watching her diet and exercising first.  Will look into options.  Follow.

## 2017-04-25 NOTE — Assessment & Plan Note (Signed)
Taking xanax.  On prozac.  Discussed with her today.  Desires no further intervention.  Follow.

## 2017-04-25 NOTE — Assessment & Plan Note (Signed)
Blood pressure has been under reasonable control.  Same medication regimen.  Follow.

## 2017-04-25 NOTE — Assessment & Plan Note (Signed)
Discussed low carb diet and exercise.  Follow met b and a1c.   

## 2017-04-25 NOTE — Telephone Encounter (Signed)
Pt called and was looking for an update on a shot that Dr, Nicki Reaper was looking into for her in regards to weight lose and diabetics. Please advise, thank you!  Call pt @ [phone]

## 2017-04-25 NOTE — Telephone Encounter (Signed)
Left message to return call to our office. Need to get more information.

## 2017-04-25 NOTE — Assessment & Plan Note (Signed)
Has been on pravastatin.  Hesitant to increase dose.  Triglycerides increased.  Has been eating out more.  Discussed low carb diet and exercise.  Follow lipid panel.

## 2017-04-25 NOTE — Assessment & Plan Note (Signed)
Increased stress as outlined.  Discussed with her today.  On prozac.  Uses xanax.  Desires no further intervention.  Follow.

## 2017-04-26 NOTE — Telephone Encounter (Signed)
Pt called back returning your call. Please advise, thank you!  Call pt @ [phone]

## 2017-04-26 NOTE — Telephone Encounter (Signed)
Angel Pulse, do you know what I would need to do to see if saxenda is covered for her for weight loss.  Please send information to me and I will notify pt.  No urgency.

## 2017-04-26 NOTE — Telephone Encounter (Signed)
Spoke to patient states that she spoke to you at her last visit about an injection. She was just following up on that. And if you had any advice on that.

## 2017-04-26 NOTE — Telephone Encounter (Signed)
Patient will ned script then we can start a PA once insurance rejects, but if she has not tried other medications she we need to try Contrave or Belviq first and document failure.

## 2017-04-27 ENCOUNTER — Other Ambulatory Visit: Payer: Self-pay | Admitting: Internal Medicine

## 2017-04-27 NOTE — Telephone Encounter (Signed)
Notify pt that saxenda is not covered by her insurance.  Let her know that I am looking into other options.  See me before calling pt.

## 2017-04-28 ENCOUNTER — Other Ambulatory Visit: Payer: Self-pay | Admitting: Internal Medicine

## 2017-04-28 DIAGNOSIS — Z1231 Encounter for screening mammogram for malignant neoplasm of breast: Secondary | ICD-10-CM

## 2017-05-03 NOTE — Telephone Encounter (Signed)
Patient informed she will call pharmacy and see what self pay rate is and call us back

## 2017-05-03 NOTE — Telephone Encounter (Signed)
Left message to return call to our office.  

## 2017-05-17 ENCOUNTER — Telehealth: Payer: Self-pay | Admitting: Internal Medicine

## 2017-05-17 NOTE — Telephone Encounter (Signed)
Pt called and stated that she was in a couple of weeks ago she had a tick on her back and has since found another one on her arm. Pt is scheduled to have a have a back injection tomorrow. Pt wanted to know if she can come in for some lab work to make sure that she doesn't have any infection. Please advise, thank you!  Call pt @ [phone]

## 2017-05-17 NOTE — Telephone Encounter (Signed)
What lab work is she talking about?  After tick exposure, we monitor for fever, body aches, rash, etc.  If she is talking about labs for tick born illness, she is not having any symptoms - per phone message.  Also, if she is talking about labs for tick born illness, these lab results would probable not be back by tomorrow.  Let me know if any problems.

## 2017-05-17 NOTE — Telephone Encounter (Signed)
Reason for call:tick bite  Symptoms:low back , 2 weeks ago right arm removed no redness scab, scheduled for back injection tomorrow injection , no fever , no body aches, just hurting in low back area Duration 1 month ago Medications: Last seen for this problem: Seen by: Wants to know if she should come in for blood work for . Please advise.

## 2017-05-17 NOTE — Telephone Encounter (Signed)
Patient advised of below , states she is having more pain in lower legs probably in relation to back pain.  If she develops in any symptoms she will contact our office and schedule appointment.

## 2017-05-29 ENCOUNTER — Other Ambulatory Visit: Payer: Self-pay | Admitting: Internal Medicine

## 2017-05-30 NOTE — Telephone Encounter (Signed)
Last script 04/28/17  Last o/v 04/12/17  F/u 07/20/17

## 2017-05-31 NOTE — Telephone Encounter (Signed)
Faxed

## 2017-05-31 NOTE — Telephone Encounter (Signed)
ok'd rx for alprazolam #60 with one refill.

## 2017-06-13 ENCOUNTER — Other Ambulatory Visit: Payer: Self-pay | Admitting: Internal Medicine

## 2017-06-27 ENCOUNTER — Other Ambulatory Visit: Payer: Self-pay | Admitting: Internal Medicine

## 2017-07-11 ENCOUNTER — Other Ambulatory Visit: Payer: Self-pay | Admitting: Internal Medicine

## 2017-07-15 ENCOUNTER — Other Ambulatory Visit: Payer: Self-pay | Admitting: Neurosurgery

## 2017-07-15 DIAGNOSIS — M48062 Spinal stenosis, lumbar region with neurogenic claudication: Secondary | ICD-10-CM

## 2017-07-18 ENCOUNTER — Other Ambulatory Visit (INDEPENDENT_AMBULATORY_CARE_PROVIDER_SITE_OTHER): Payer: BLUE CROSS/BLUE SHIELD

## 2017-07-18 DIAGNOSIS — E538 Deficiency of other specified B group vitamins: Secondary | ICD-10-CM | POA: Diagnosis not present

## 2017-07-18 DIAGNOSIS — I10 Essential (primary) hypertension: Secondary | ICD-10-CM

## 2017-07-18 DIAGNOSIS — R739 Hyperglycemia, unspecified: Secondary | ICD-10-CM | POA: Diagnosis not present

## 2017-07-18 DIAGNOSIS — E7800 Pure hypercholesterolemia, unspecified: Secondary | ICD-10-CM

## 2017-07-18 LAB — LIPID PANEL
CHOL/HDL RATIO: 6
Cholesterol: 255 mg/dL — ABNORMAL HIGH (ref 0–200)
HDL: 42.8 mg/dL (ref >39.00)
NONHDL: 212.48
Triglycerides: 338 mg/dL — ABNORMAL HIGH (ref 0.0–149.0)
VLDL: 67.6 mg/dL — ABNORMAL HIGH (ref 0.0–40.0)

## 2017-07-18 LAB — HEPATIC FUNCTION PANEL
ALBUMIN: 4.5 g/dL (ref 3.5–5.2)
ALK PHOS: 91 U/L (ref 39–117)
ALT: 13 U/L (ref 0–35)
AST: 22 U/L (ref 0–37)
BILIRUBIN DIRECT: 0.1 mg/dL (ref 0.0–0.3)
Total Bilirubin: 0.6 mg/dL (ref 0.2–1.2)
Total Protein: 6.8 g/dL (ref 6.0–8.3)

## 2017-07-18 LAB — BASIC METABOLIC PANEL
BUN: 12 mg/dL (ref 6–23)
CHLORIDE: 102 meq/L (ref 96–112)
CO2: 28 mEq/L (ref 19–32)
CREATININE: 0.83 mg/dL (ref 0.40–1.20)
Calcium: 9.5 mg/dL (ref 8.4–10.5)
GFR: 74.17 mL/min (ref >60.00)
Glucose, Bld: 93 mg/dL (ref 70–99)
POTASSIUM: 3.8 meq/L (ref 3.5–5.1)
Sodium: 137 mEq/L (ref 135–145)

## 2017-07-18 LAB — LDL CHOLESTEROL, DIRECT: LDL DIRECT: 146 mg/dL

## 2017-07-18 LAB — HEMOGLOBIN A1C: Hgb A1c MFr Bld: 5.8 % (ref 4.6–6.5)

## 2017-07-18 LAB — VITAMIN B12: VITAMIN B 12: 481 pg/mL (ref 211–911)

## 2017-07-20 ENCOUNTER — Encounter: Payer: Self-pay | Admitting: Internal Medicine

## 2017-07-20 ENCOUNTER — Ambulatory Visit (INDEPENDENT_AMBULATORY_CARE_PROVIDER_SITE_OTHER): Payer: BLUE CROSS/BLUE SHIELD | Admitting: Internal Medicine

## 2017-07-20 VITALS — BP 138/88 | HR 84 | Temp 98.6°F | Resp 14 | Ht 65.0 in | Wt 191.0 lb

## 2017-07-20 DIAGNOSIS — Z23 Encounter for immunization: Secondary | ICD-10-CM

## 2017-07-20 DIAGNOSIS — R739 Hyperglycemia, unspecified: Secondary | ICD-10-CM

## 2017-07-20 DIAGNOSIS — E66811 Obesity, class 1: Secondary | ICD-10-CM

## 2017-07-20 DIAGNOSIS — G8929 Other chronic pain: Secondary | ICD-10-CM

## 2017-07-20 DIAGNOSIS — I10 Essential (primary) hypertension: Secondary | ICD-10-CM | POA: Diagnosis not present

## 2017-07-20 DIAGNOSIS — E538 Deficiency of other specified B group vitamins: Secondary | ICD-10-CM

## 2017-07-20 DIAGNOSIS — E669 Obesity, unspecified: Secondary | ICD-10-CM | POA: Diagnosis not present

## 2017-07-20 DIAGNOSIS — E7800 Pure hypercholesterolemia, unspecified: Secondary | ICD-10-CM

## 2017-07-20 DIAGNOSIS — F329 Major depressive disorder, single episode, unspecified: Secondary | ICD-10-CM | POA: Diagnosis not present

## 2017-07-20 DIAGNOSIS — M549 Dorsalgia, unspecified: Secondary | ICD-10-CM

## 2017-07-20 DIAGNOSIS — F32A Depression, unspecified: Secondary | ICD-10-CM

## 2017-07-20 MED ORDER — CYANOCOBALAMIN 1000 MCG/ML IJ SOLN
1000.0000 ug | Freq: Once | INTRAMUSCULAR | Status: AC
  Administered 2017-07-20: 1000 ug via INTRAMUSCULAR

## 2017-07-20 MED ORDER — TETANUS-DIPHTH-ACELL PERTUSSIS 5-2.5-18.5 LF-MCG/0.5 IM SUSP: 0.5000 mL | Freq: Once | INTRAMUSCULAR | 0 refills | Status: AC

## 2017-07-20 MED ORDER — PROMETHAZINE HCL 12.5 MG PO TABS: 12.5000 mg | ORAL_TABLET | Freq: Two times a day (BID) | ORAL | 0 refills | Status: DC | PRN

## 2017-07-20 NOTE — Progress Notes (Signed)
Pre-visit discussion using our clinic review tool. No additional management support is needed unless otherwise documented below in the visit note.  

## 2017-07-20 NOTE — Progress Notes (Signed)
Patient ID: Angel Riddle, female   DOB: [birth year], 61 y.o.   MRN: [id number]   Subjective:    Patient ID: Angel Riddle, female    DOB: 05-09-1956, 61 y.o.   MRN: [id number]  HPI  Patient here for a scheduled follow up.  Has a history of chronic back pain.  Worsened recently.  Seeing neurosurgery.  Planning for myelogram this week.  Seeing dr Arnoldo Morale now.  Back is limiting her activity and exercise.  Increased stress related to this.  Discussed diet and exercise.  She is contemplating starting TOPPS program.  Discussed increased stress.  Overall she feels she is handling things relatively well.  No chest pain.  Breathing stable.  No acid reflux.  No abdominal pain.  Bowels moving.  Discussed recent labs.  LDL 146.  She declines to change or increase her cholesterol medication.     Past Medical History:  Diagnosis Date  . Allergy   . Anxiety   . Arthritis   . Chronic back pain    degenerative disc disease  . Degenerative disc disease, lumbar   . Depression   . Gallstones   . GERD (gastroesophageal reflux disease)   . Heart murmur    followed by PCP  . Hx of colonic polyp   . Hx: UTI (urinary tract infection)   . Hypercholesterolemia   . Hypertension   . Panic attacks    H/O  . Wears dentures    partial upper and lower   Past Surgical History:  Procedure Laterality Date  . ABDOMINAL HYSTERECTOMY  1992   partial  . CATARACT EXTRACTION Bilateral [id number]  . CHOLECYSTECTOMY  04-19-14  . COLONOSCOPY  07/02/11   DR. Byrnett  . COLONOSCOPY WITH PROPOFOL N/A 09/25/2015   Procedure: COLONOSCOPY WITH PROPOFOL;  Surgeon: Lucilla Lame, MD;  Location: [facility];  Service: Endoscopy;  Laterality: N/A;  . FOOT SURGERY Right 2009  . MOUTH SURGERY    . POLYPECTOMY  09/25/2015   Procedure: POLYPECTOMY;  Surgeon: Lucilla Lame, MD;  Location: [facility];  Service: Endoscopy;;  . skin surgery Left 08/27/13   Basal cell removal-left nostril  . TONSILLECTOMY AND  ADENOIDECTOMY  1969   Family History  Problem Relation Age of Onset  . Hyperlipidemia Mother   . Hypertension Mother   . Alcohol abuse Father   . Hyperlipidemia Father   . Heart disease Father   . Diabetes Father   . Hyperlipidemia Sister   . Lung cancer Brother   . Hyperlipidemia Brother    Social History   Social History  . Marital status: Married    Spouse name: N/A  . Number of children: 2  . Years of education: N/A   Social History Main Topics  . Smoking status: Former Smoker    Packs/day: 1.00    Years: 37.00    Quit date: 2012  . Smokeless tobacco: Never Used  . Alcohol use 0.6 oz/week    1 Glasses of wine per week  . Drug use: No  . Sexual activity: Not Asked   Other Topics Concern  . None   Social History Narrative  . None    Outpatient Encounter Prescriptions as of 07/20/2017  Medication Sig  . ALPRAZolam (XANAX) 1 MG tablet TAKE (1) TABLET TWICE A DAY.  Marland Kitchen FLUoxetine (PROZAC) 40 MG capsule Take 1 capsule (40 mg total) by mouth daily.  . fluticasone (FLONASE) 50 MCG/ACT nasal spray Place 1 spray into both nostrils. Reported on 11/25/2015  .  gabapentin (NEURONTIN) 100 MG capsule Take 200 mg by mouth daily.  . hydrochlorothiazide (MICROZIDE) 12.5 MG capsule TAKE ONE CAPSULE DAILY.  Marland Kitchen HYDROcodone-acetaminophen (NORCO/VICODIN) 5-325 MG per tablet Take 5-325 tablets by mouth 2 (two) times daily as needed.  . hydrocortisone (ANUSOL-HC) 25 MG suppository Place 1 suppository (25 mg total) rectally 2 (two) times daily.  . meloxicam (MOBIC) 15 MG tablet Take 1 tablet (15 mg total) by mouth daily.  . pantoprazole (PROTONIX) 40 MG tablet Take 1 tablet (40 mg total) by mouth daily.  . potassium chloride (K-DUR) 10 MEQ tablet Take 1 tablet (10 mEq total) by mouth daily.  . pravastatin (PRAVACHOL) 10 MG tablet Take 1 tablet (10 mg total) by mouth daily.  . promethazine (PHENERGAN) 12.5 MG tablet Take 1 tablet (12.5 mg total) by mouth 2 (two) times daily as needed for  nausea or vomiting.  . Pseudoeph-Doxylamine-DM-APAP (NYQUIL PO) Take by mouth. Reported on 05/18/2016  . Pseudoephedrine HCl 240 MG TB24 Take by mouth as needed. Reported on 05/18/2016  . tiZANidine (ZANAFLEX) 4 MG tablet 1/2-1 po bid prn  . [DISCONTINUED] pregabalin (LYRICA) 50 MG capsule Take 50 mg by mouth 2 (two) times daily.  . [DISCONTINUED] promethazine (PHENERGAN) 12.5 MG tablet Take 1 tablet (12.5 mg total) by mouth 2 (two) times daily as needed for nausea or vomiting.  . [EXPIRED] Tdap (BOOSTRIX) 5-2.5-18.5 LF-MCG/0.5 injection Inject 0.5 mLs into the muscle once.   Facility-Administered Encounter Medications as of 07/20/2017  Medication  . cyanocobalamin ((VITAMIN B-12)) injection 1,000 mcg  . [COMPLETED] cyanocobalamin ((VITAMIN B-12)) injection 1,000 mcg    Review of Systems  Constitutional: Negative for appetite change and unexpected weight change.  HENT: Negative for congestion and sinus pressure.   Respiratory: Negative for cough, chest tightness and shortness of breath.   Cardiovascular: Negative for chest pain, palpitations and leg swelling.  Gastrointestinal: Negative for abdominal pain, diarrhea, nausea and vomiting.  Genitourinary: Negative for difficulty urinating and dysuria.  Musculoskeletal: Positive for back pain. Negative for joint swelling.  Skin: Negative for color change and rash.  Neurological: Negative for dizziness, light-headedness and headaches.  Psychiatric/Behavioral: Negative for agitation and dysphoric mood.       Objective:    Physical Exam  Constitutional: She appears well-developed and well-nourished. No distress.  HENT:  Nose: Nose normal.  Mouth/Throat: Oropharynx is clear and moist.  Neck: Neck supple. No thyromegaly present.  Cardiovascular: Normal rate and regular rhythm.   Pulmonary/Chest: Breath sounds normal. No respiratory distress. She has no wheezes.  Abdominal: Soft. Bowel sounds are normal. There is no tenderness.    Musculoskeletal: She exhibits no edema or tenderness.  Lymphadenopathy:    She has no cervical adenopathy.  Skin: No rash noted. No erythema.  Psychiatric: She has a normal mood and affect. Her behavior is normal.    BP 138/88   Pulse 84   Temp 98.6 F (37 C) (Oral)   Resp 14   Ht '5\' 5"'  (1.651 m)   Wt 191 lb (86.6 kg)   SpO2 95%   BMI 31.78 kg/m  Wt Readings from Last 3 Encounters:  07/20/17 191 lb (86.6 kg)  04/13/17 189 lb 12.8 oz (86.1 kg)  12/15/16 180 lb 0.4 oz (81.7 kg)     Lab Results  Component Value Date   WBC 6.1 08/16/2016   HGB 13.3 08/16/2016   HCT 38.8 08/16/2016   PLT 289.0 08/16/2016   GLUCOSE 93 07/18/2017   CHOL 255 (H) 07/18/2017   TRIG  338.0 (H) 07/18/2017   HDL 42.80 07/18/2017   LDLDIRECT 146.0 07/18/2017   LDLCALC 218 (H) 03/14/2014   ALT 13 07/18/2017   AST 22 07/18/2017   NA 137 07/18/2017   K 3.8 07/18/2017   CL 102 07/18/2017   CREATININE 0.83 07/18/2017   BUN 12 07/18/2017   CO2 28 07/18/2017   TSH 1.06 08/16/2016   HGBA1C 5.8 07/18/2017    Mr Lumbar Spine Wo Contrast  Result Date: 02/22/2017 CLINICAL DATA:  Lumbar stenosis with neurogenic claudication. Lumbar radiculitis. EXAM: MRI LUMBAR SPINE WITHOUT CONTRAST TECHNIQUE: Multiplanar, multisequence MR imaging of the lumbar spine was performed. No intravenous contrast was administered. COMPARISON:  Lumbar MRI 04/02/2015 FINDINGS: Segmentation:  Normal. Alignment: Mild retrolisthesis L3-4 L4-5 unchanged from the prior study Vertebrae: Negative for fracture or mass. Hemangioma L3 vertebral body on the left is unchanged. Conus medullaris: Extends to the L1-2 level and appears normal. Paraspinal and other soft tissues: Negative Disc levels: L1-2: Mild disc degeneration and moderate facet degeneration without significant spinal stenosis. L2-3:  Mild disc and facet degeneration without significant stenosis L3-4: Mild retrolisthesis. Diffuse bulging of the disc and moderate facet hypertrophy.  Mild spinal stenosis. Moderate right foraminal encroachment. L4-5: Diffuse disc bulging and endplate spurring, right greater than left. Bilateral facet hypertrophy. Severe right foraminal encroachment with compression of the right L4 nerve root. Mild spinal stenosis. Mild left foraminal narrowing L5-S1:  Mild facet degeneration without stenosis IMPRESSION: Mild spinal stenosis at L3-4 with moderate right foraminal encroachment Mild spinal stenosis L4-5 with severe right foraminal encroachment and mild left foraminal narrowing. Electronically Signed   By: Franchot Gallo M.D.   On: 02/22/2017 14:15       Assessment & Plan:   Problem List Items Addressed This Visit    Chronic back pain    Limits her activity.  Seeing Dr Arnoldo Morale.  Planning for myelogram this week.        Depression    Increased stress.  Discussed with her today.  Overall stable.  Follow.       Hypercholesterolemia    Cholesterol labs reviewed with her.  Discussed my desire to increase her cholesterol medication.  She declines increase and declines change in medication.  Discussed diet and exercise.  Follow lipid panel and liver function tests.        Relevant Orders   Lipid panel   Hepatic function panel   Hyperglycemia    Low carb diet and exercise.  Follow met b and a1c.  Recent a1c 5.8.       Relevant Orders   Hemoglobin A1c   Hypertension    Blood pressure under reasonable control.  Continue current medication regimen. Follow pressures.  Follow metabolic panel.        Relevant Orders   CBC with Differential/Platelet   TSH   Basic metabolic panel   Obesity (BMI 30.0-34.9)    Discussed diet and exercise.  Discussed weight loss treatment options.  She is contemplating starting TOPPS.        Other Visit Diagnoses    Need for tetanus booster    -  Primary   Vitamin B 12 deficiency       Relevant Medications   cyanocobalamin ((VITAMIN B-12)) injection 1,000 mcg (Completed)       Einar Pheasant, MD

## 2017-07-22 ENCOUNTER — Ambulatory Visit
Admission: RE | Admit: 2017-07-22 | Discharge: 2017-07-22 | Disposition: A | Discharge status: Home / Self Care | Payer: BLUE CROSS/BLUE SHIELD | Source: Ambulatory Visit | Attending: Neurosurgery | Admitting: Neurosurgery

## 2017-07-22 DIAGNOSIS — M48062 Spinal stenosis, lumbar region with neurogenic claudication: Secondary | ICD-10-CM

## 2017-07-22 MED ORDER — DIAZEPAM 5 MG PO TABS
5.0000 mg | ORAL_TABLET | Freq: Once | ORAL | Status: AC
  Administered 2017-07-22: 5 mg via ORAL

## 2017-07-22 MED ORDER — IOPAMIDOL (ISOVUE-M 200) INJECTION 41%
15.0000 mL | Freq: Once | INTRAMUSCULAR | Status: AC
  Administered 2017-07-22: 15 mL via INTRATHECAL

## 2017-07-22 MED ORDER — ONDANSETRON HCL 4 MG/2ML IJ SOLN
4.0000 mg | Freq: Once | INTRAMUSCULAR | Status: AC
  Administered 2017-07-22: 4 mg via INTRAMUSCULAR

## 2017-07-22 MED ORDER — MEPERIDINE HCL 100 MG/ML IJ SOLN
75.0000 mg | Freq: Once | INTRAMUSCULAR | Status: AC
  Administered 2017-07-22: 75 mg via INTRAMUSCULAR

## 2017-07-22 NOTE — Progress Notes (Signed)
Pt states she has been off Prozac and Promethazine since Monday.

## 2017-07-22 NOTE — Discharge Instructions (Signed)
°  Myelogram Discharge Instructions  1. Go home and rest quietly for the next 24 hours.  It is important to lie flat for the next 24 hours.  Get up only to go to the restroom.  You may lie in the bed or on a couch on your back, your stomach, your left side or your right side.  You may have one pillow under your head.  You may have pillows between your knees while you are on your side or under your knees while you are on your back.  2. DO NOT drive today.  Recline the seat as far back as it will go, while still wearing your seat belt, on the way home.  3. You may get up to go to the bathroom as needed.  You may sit up for 10 minutes to eat.  You may resume your normal diet and medications unless otherwise indicated.  Drink lots of extra fluids today and tomorrow.  4. The incidence of headache, nausea, or vomiting is about 5% (one in 20 patients).  If you develop a headache, lie flat and drink plenty of fluids until the headache goes away.  Caffeinated beverages may be helpful.  If you develop severe nausea and vomiting or a headache that does not go away with flat bed rest, call [phone].  5. You may resume normal activities after your 24 hours of bed rest is over; however, do not exert yourself strongly or do any heavy lifting tomorrow. If when you get up you have a headache when standing, go back to bed and force fluids for another 24 hours.  6. Call your physician for a follow-up appointment.  The results of your myelogram will be sent directly to your physician by the following day.  7. If you have any questions or if complications develop after you arrive home, please call [phone].  Discharge instructions have been explained to the patient.  The patient, or the person responsible for the patient, fully understands these instructions.       May resume Promethazine and Prozac on Aug. 18, 2018, after 1:00 pm.

## 2017-07-23 ENCOUNTER — Encounter: Payer: Self-pay | Admitting: Internal Medicine

## 2017-07-23 NOTE — Assessment & Plan Note (Signed)
Discussed diet and exercise.  Discussed weight loss treatment options.  She is contemplating starting TOPPS.

## 2017-07-23 NOTE — Assessment & Plan Note (Signed)
Blood pressure under reasonable control.  Continue current medication regimen. Follow pressures.  Follow metabolic panel.

## 2017-07-23 NOTE — Assessment & Plan Note (Signed)
Low carb diet and exercise.  Follow met b and a1c.  Recent a1c 5.8.  

## 2017-07-23 NOTE — Assessment & Plan Note (Signed)
Cholesterol labs reviewed with her.  Discussed my desire to increase her cholesterol medication.  She declines increase and declines change in medication.  Discussed diet and exercise.  Follow lipid panel and liver function tests.

## 2017-07-23 NOTE — Assessment & Plan Note (Signed)
Limits her activity.  Seeing Dr Arnoldo Morale.  Planning for myelogram this week.

## 2017-07-23 NOTE — Assessment & Plan Note (Signed)
Increased stress.  Discussed with her today.  Overall stable.  Follow.

## 2017-07-26 ENCOUNTER — Other Ambulatory Visit: Payer: Self-pay | Admitting: Internal Medicine

## 2017-07-29 ENCOUNTER — Encounter: Payer: Self-pay | Admitting: Emergency Medicine

## 2017-07-29 ENCOUNTER — Emergency Department: Payer: BLUE CROSS/BLUE SHIELD

## 2017-07-29 DIAGNOSIS — G8929 Other chronic pain: Secondary | ICD-10-CM | POA: Diagnosis not present

## 2017-07-29 DIAGNOSIS — I10 Essential (primary) hypertension: Secondary | ICD-10-CM | POA: Diagnosis not present

## 2017-07-29 DIAGNOSIS — J029 Acute pharyngitis, unspecified: Secondary | ICD-10-CM | POA: Diagnosis present

## 2017-07-29 DIAGNOSIS — Z79899 Other long term (current) drug therapy: Secondary | ICD-10-CM | POA: Diagnosis not present

## 2017-07-29 DIAGNOSIS — Z791 Long term (current) use of non-steroidal anti-inflammatories (NSAID): Secondary | ICD-10-CM | POA: Insufficient documentation

## 2017-07-29 DIAGNOSIS — K122 Cellulitis and abscess of mouth: Secondary | ICD-10-CM | POA: Diagnosis not present

## 2017-07-29 DIAGNOSIS — Z87891 Personal history of nicotine dependence: Secondary | ICD-10-CM | POA: Diagnosis not present

## 2017-07-29 NOTE — ED Notes (Signed)
POCT Rapid Strep A: Negitive

## 2017-07-29 NOTE — ED Triage Notes (Signed)
Pt arrives via POV with c/o sore throat. Pt is ambulatory and able to handle secretions at this time. Pt does have noticeably swollen and reddened uvula in back of throat.

## 2017-07-30 ENCOUNTER — Emergency Department
Admission: EM | Admit: 2017-07-30 | Discharge: 2017-07-30 | Disposition: A | Discharge status: Home / Self Care | Payer: BLUE CROSS/BLUE SHIELD | Attending: Emergency Medicine | Admitting: Emergency Medicine

## 2017-07-30 DIAGNOSIS — K122 Cellulitis and abscess of mouth: Secondary | ICD-10-CM | POA: Diagnosis not present

## 2017-07-30 MED ORDER — CEPHALEXIN 500 MG PO CAPS: 500.0000 mg | ORAL_CAPSULE | Freq: Three times a day (TID) | ORAL | 0 refills | Status: DC

## 2017-07-30 MED ORDER — CEPHALEXIN 500 MG PO CAPS
500.0000 mg | ORAL_CAPSULE | Freq: Once | ORAL | Status: AC
  Administered 2017-07-30: 500 mg via ORAL
  Filled 2017-07-30: qty 1

## 2017-07-30 MED ORDER — DEXAMETHASONE 4 MG PO TABS
10.0000 mg | ORAL_TABLET | Freq: Once | ORAL | Status: AC
  Administered 2017-07-30: 10 mg via ORAL
  Filled 2017-07-30: qty 3
  Filled 2017-07-30: qty 2.5

## 2017-07-30 MED ORDER — GI COCKTAIL ~~LOC~~
30.0000 mL | Freq: Once | ORAL | Status: AC
  Administered 2017-07-30: 30 mL via ORAL
  Filled 2017-07-30: qty 30

## 2017-07-30 NOTE — ED Notes (Signed)

## 2017-07-30 NOTE — ED Provider Notes (Signed)
[facility] Emergency Department Provider Note  Time seen: 3:02 AM  I have reviewed the triage vital signs and the nursing notes.   HISTORY  Chief Complaint Sore Throat    HPI Angel Riddle is a 61 y.o. female with a past medical history of arthritis, chronic back pain, anxiety, gastric reflux, hypertension, presents to the emergency department for a sore throat. According to the patient she awoke this morning with a sore throat. She states her uvula is quite sore and she thought it could've been due to snoring heavily. She states throughout the day it has progressively worsened and felt swollen. She states she has been feeling very fatigued as well. She states is a sore throat continued to worsen she became concerned and attempted to go to urgent care but they had closed so she came to the emergency department for evaluation. Currently the patient appears extremely well, no distress. Speaking with no difficulty.  Past Medical History:  Diagnosis Date  . Allergy   . Anxiety   . Arthritis   . Chronic back pain    degenerative disc disease  . Degenerative disc disease, lumbar   . Depression   . Gallstones   . GERD (gastroesophageal reflux disease)   . Heart murmur    followed by PCP  . Hx of colonic polyp   . Hx: UTI (urinary tract infection)   . Hypercholesterolemia   . Hypertension   . Panic attacks    H/O  . Wears dentures    partial upper and lower    Patient Active Problem List   Diagnosis Date Noted  . Obesity (BMI 30.0-34.9) 04/25/2017  . Hyperglycemia 04/25/2017  . Personal history of tobacco use, presenting hazards to health 12/15/2016  . Nausea without vomiting 03/29/2016  . Personal history of colonic polyps   . Anal fissure   . Benign neoplasm of sigmoid colon   . Left arm pain 08/03/2015  . Cough 07/30/2015  . Urinary hesitancy 07/30/2015  . Health care maintenance 01/12/2015  . Rectal irritation 11/24/2014  . Gallstones  04/01/2014  . Abdominal fullness 03/10/2014  . Numbness and tingling 10/02/2013  . Hypertension 10/02/2013  . Chronic back pain 07/18/2013  . Depression 07/18/2013  . Anxiety 07/18/2013  . Panic attacks 07/18/2013  . Environmental allergies 07/18/2013  . Hypercholesterolemia 07/18/2013  . History of colonic polyps 07/18/2013    Past Surgical History:  Procedure Laterality Date  . ABDOMINAL HYSTERECTOMY  1992   partial  . CATARACT EXTRACTION Bilateral [id number]  . CHOLECYSTECTOMY  04-19-14  . COLONOSCOPY  07/02/11   DR. Byrnett  . COLONOSCOPY WITH PROPOFOL N/A 09/25/2015   Procedure: COLONOSCOPY WITH PROPOFOL;  Surgeon: Lucilla Lame, MD;  Location: [facility];  Service: Endoscopy;  Laterality: N/A;  . FOOT SURGERY Right 2009  . MOUTH SURGERY    . POLYPECTOMY  09/25/2015   Procedure: POLYPECTOMY;  Surgeon: Lucilla Lame, MD;  Location: [facility];  Service: Endoscopy;;  . skin surgery Left 08/27/13   Basal cell removal-left nostril  . TONSILLECTOMY AND ADENOIDECTOMY  1969    Prior to Admission medications   Medication Sig Start Date End Date Taking? Authorizing Provider  ALPRAZolam Duanne Moron) 1 MG tablet TAKE (1) TABLET TWICE A DAY. 05/31/17   Einar Pheasant, MD  FLUoxetine (PROZAC) 40 MG capsule Take 1 capsule (40 mg total) by mouth daily. 07/11/17   Einar Pheasant, MD  fluticasone (FLONASE) 50 MCG/ACT nasal spray Place 1 spray into both nostrils.  Reported on 11/25/2015 12/24/13   [provider]  gabapentin (NEURONTIN) 100 MG capsule Take 200 mg by mouth daily. 02/09/17   [provider]  hydrochlorothiazide (MICROZIDE) 12.5 MG capsule TAKE ONE CAPSULE DAILY. 06/13/17   Einar Pheasant, MD  HYDROcodone-acetaminophen (NORCO/VICODIN) 5-325 MG per tablet Take 5-325 tablets by mouth 2 (two) times daily as needed. 04/22/15   [provider]  hydrocortisone (ANUSOL-HC) 25 MG suppository Place 1 suppository (25 mg total) rectally 2 (two) times daily.  11/28/15   Einar Pheasant, MD  meloxicam (MOBIC) 15 MG tablet Take 1 tablet (15 mg total) by mouth daily. 06/27/17   Einar Pheasant, MD  pantoprazole (PROTONIX) 40 MG tablet Take 1 tablet (40 mg total) by mouth daily. 03/10/17   Einar Pheasant, MD  potassium chloride (K-DUR) 10 MEQ tablet Take 1 tablet (10 mEq total) by mouth daily. 07/26/17   Einar Pheasant, MD  pravastatin (PRAVACHOL) 10 MG tablet Take 1 tablet (10 mg total) by mouth daily. 04/15/17   Einar Pheasant, MD  promethazine (PHENERGAN) 12.5 MG tablet Take 1 tablet (12.5 mg total) by mouth 2 (two) times daily as needed for nausea or vomiting. 07/20/17   Einar Pheasant, MD  Pseudoeph-Doxylamine-DM-APAP (NYQUIL PO) Take by mouth. Reported on 05/18/2016    [provider]  Pseudoephedrine HCl 240 MG TB24 Take by mouth as needed. Reported on 05/18/2016    [provider]  tiZANidine (ZANAFLEX) 4 MG tablet 1/2-1 po bid prn 02/08/17   [provider]    Allergies  Allergen Reactions  . Levaquin [Levofloxacin In D5w]     Muscle pain  . Doxycycline Nausea And Vomiting  . Ivp Dye [Iodinated Diagnostic Agents] Nausea And Vomiting     Eyes turned blood red    Family History  Problem Relation Age of Onset  . Hyperlipidemia Mother   . Hypertension Mother   . Alcohol abuse Father   . Hyperlipidemia Father   . Heart disease Father   . Diabetes Father   . Hyperlipidemia Sister   . Lung cancer Brother   . Hyperlipidemia Brother     Social History Social History  Substance Use Topics  . Smoking status: Former Smoker    Packs/day: 1.00    Years: 37.00    Quit date: 2012  . Smokeless tobacco: Never Used  . Alcohol use 0.6 oz/week    1 Glasses of wine per week    Review of Systems Constitutional: Negative for fever. ENT: Positive for sore throat, positive for pain with swallowing. Cardiovascular: Negative for chest pain. Respiratory: Negative for shortness of breath. Gastrointestinal: Negative for  abdominal pain Neurological: Negative for headache All other ROS negative  ____________________________________________   PHYSICAL EXAM:  VITAL SIGNS: ED Triage Vitals  Enc Vitals Group     BP 07/29/17 2121 (!) 158/101     Pulse Rate 07/29/17 2121 (!) 109     Resp 07/29/17 2121 20     Temp 07/29/17 2121 98.6 F (37 C)     Temp Source 07/29/17 2121 Oral     SpO2 07/29/17 2121 97 %     Weight 07/29/17 2122 191 lb (86.6 kg)     Height 07/29/17 2122 5\' 5"  (1.651 m)     Head Circumference --      Peak Flow --      Pain Score 07/29/17 2120 9     Pain Loc --      Pain Edu? --      Excl. in  GC? --     Constitutional: Alert and oriented. Well appearing and in no distress. Eyes: Normal exam ENT   Head: Normocephalic and atraumatic   Mouth/Throat: Mucous membranes are moist.Patient does have moderate erythema of her uvula and mild erythema of the tonsils. Mild edema of the uvula which is midline. Widely patent airway. Cardiovascular: Normal rate, regular rhythm. No murmur Respiratory: Normal respiratory effort without tachypnea nor retractions. Breath sounds are clear  Gastrointestinal: Soft and nontender. No distention.   Musculoskeletal: Nontender with normal range of motion in all extremities.  Neurologic:  Normal speech and language. No gross focal neurologic deficits  Skin:  Skin is warm, dry and intact.  Psychiatric: Mood and affect are normal.   ____________________________________________   INITIAL IMPRESSION / ASSESSMENT AND PLAN / ED COURSE  Pertinent labs & imaging results that were available during my care of the patient were reviewed by me and considered in my medical decision making (see chart for details).  Exam most consistent with uvulitis. Strep test is negative. X-rays negative. We will dose Decadron in the emergency department, started the patient on Keflex cover streptococcal infection, we will provide a GI cocktail in the emergency department for  symptomatic relief of discomfort. Patient is agreeable to this plan and will follow up with her doctor.  ____________________________________________   FINAL CLINICAL IMPRESSION(S) / ED DIAGNOSES  Ortencia Kick, MD 07/30/17 [phone]

## 2017-08-01 LAB — CULTURE, GROUP A STREP (THRC)

## 2017-08-05 ENCOUNTER — Other Ambulatory Visit: Payer: Self-pay | Admitting: Neurosurgery

## 2017-08-10 ENCOUNTER — Telehealth: Payer: Self-pay | Admitting: Internal Medicine

## 2017-08-10 NOTE — Telephone Encounter (Signed)
Pt called about needing a copy of all the surgeries and the dates that she's had for Dr Newman [facility] Neurosurgery and spine. Please advise?  Call pt @ [phone]. Thank you!

## 2017-08-11 NOTE — Telephone Encounter (Signed)
Left message to return call to our office.  

## 2017-08-13 ENCOUNTER — Other Ambulatory Visit: Payer: Self-pay | Admitting: Internal Medicine

## 2017-08-16 ENCOUNTER — Other Ambulatory Visit: Payer: Self-pay | Admitting: Internal Medicine

## 2017-08-16 NOTE — Telephone Encounter (Signed)
Last refill was 05/31/17, #60 with 1 refill, last OV Was 07/20/2017, Please advise, thanks

## 2017-08-16 NOTE — Telephone Encounter (Signed)
Called patent she received dates from his office.

## 2017-08-17 NOTE — Telephone Encounter (Signed)
Faxed to [facility] [employer]

## 2017-08-17 NOTE — Telephone Encounter (Signed)
rx ok'd for xanax #60 with one refill.

## 2017-08-31 ENCOUNTER — Other Ambulatory Visit: Payer: Self-pay | Admitting: Internal Medicine

## 2017-09-07 NOTE — Pre-Procedure Instructions (Signed)
Angel Riddle  09/07/2017      [facility] [place], [place] - [address] [place] [address] Phone: [phone] Fax: [phone]    Your procedure is scheduled on Thurs. Oct. 11  Report to [facility] Admitting at 5:30 A.M.  Call this number if you have problems the morning of surgery:  [phone]   Remember:  Do not eat food or drink liquids after midnight on Oct. 10   Take these medicines the morning of surgery with A SIP OF WATER : xanax if needed, prozac, flonase if needed, hydrocodone if needed, claritin if needed, eye drops, pantoprazole (protonix), phenergan if needed, tizanidine (zanaflex) if needed             7 days prior to surgery STOP taking any Aspirin (unless otherwise instructed by your surgeon), Aleve, Naproxen, Ibuprofen, Motrin, Advil, Mobic, Goody's, BC's, all herbal medications, fish oil, and all vitamins   Do not wear jewelry, make-up or nail polish.  Do not wear lotions, powders, or perfumes, or deoderant.  Do not shave 48 hours prior to surgery.  Men may shave face and neck.  Do not bring valuables to the hospital.  [facility] is not responsible for any belongings or valuables.  Contacts, dentures or bridgework may not be worn into surgery.  Leave your suitcase in the car.  After surgery it may be brought to your room.  For patients admitted to the hospital, discharge time will be determined by your treatment team.  Patients discharged the day of surgery will not be allowed to drive home.   Special instructions:  [place]- Preparing For Surgery  Before surgery, you can play an important role. Because skin is not sterile, your skin needs to be as free of germs as possible. You can reduce the number of germs on your skin by washing with CHG (chlorahexidine gluconate) Soap before surgery.  CHG is an antiseptic cleaner which kills germs and bonds with the skin to continue killing germs even after  washing.  Please do not use if you have an allergy to CHG or antibacterial soaps. If your skin becomes reddened/irritated stop using the CHG.  Do not shave (including legs and underarms) for at least 48 hours prior to first CHG shower. It is OK to shave your face.  Please follow these instructions carefully.   1. Shower the NIGHT BEFORE SURGERY and the MORNING OF SURGERY with CHG.   2. If you chose to wash your hair, wash your hair first as usual with your normal shampoo.  3. After you shampoo, rinse your hair and body thoroughly to remove the shampoo.  4. Use CHG as you would any other liquid soap. You can apply CHG directly to the skin and wash gently with a scrungie or a clean washcloth.   5. Apply the CHG Soap to your body ONLY FROM THE NECK DOWN.  Do not use on open wounds or open sores. Avoid contact with your eyes, ears, mouth and genitals (private parts). Wash genitals (private parts) with your normal soap.  USE REGULAR SHAMPOO AND CONDITIONER FOR HAIR USE REGULAR SOAP FOR FACE AND PRIVATE AREA  6. Wash thoroughly, paying special attention to the area where your surgery will be performed.  7. Thoroughly rinse your body with warm water from the neck down.  8. DO NOT shower/wash with your normal soap after using and rinsing off the CHG Soap.  9. Pat yourself dry with a CLEAN TOWEL and [place] CLOTH  10. Wear CLEAN PAJAMAS to bed the night before surgery, wear comfortable clothes the morning of surgery  11. Place CLEAN SHEETS on your bed the night of your first shower and DO NOT SLEEP WITH PETS.    Day of Surgery: Do not apply any deodorants/lotions. Please wear clean clothes to the hospital/surgery center.      Please read over the following fact sheets that you were given. Pain Booklet, MRSA Information and Surgical Site Infection Prevention

## 2017-09-08 ENCOUNTER — Encounter (HOSPITAL_COMMUNITY)
Admission: RE | Admit: 2017-09-08 | Discharge: 2017-09-08 | Disposition: A | Discharge status: Home / Self Care | Payer: BLUE CROSS/BLUE SHIELD | Source: Ambulatory Visit | Attending: Neurosurgery | Admitting: Neurosurgery

## 2017-09-08 ENCOUNTER — Encounter (HOSPITAL_COMMUNITY): Payer: Self-pay

## 2017-09-08 ENCOUNTER — Other Ambulatory Visit: Payer: Self-pay

## 2017-09-08 DIAGNOSIS — M48062 Spinal stenosis, lumbar region with neurogenic claudication: Secondary | ICD-10-CM | POA: Insufficient documentation

## 2017-09-08 HISTORY — DX: Dyspnea, unspecified: R06.00

## 2017-09-08 HISTORY — DX: Malignant (primary) neoplasm, unspecified: C80.1

## 2017-09-08 LAB — CBC
HCT: 39.4 % (ref 36.0–46.0)
Hemoglobin: 13.1 g/dL (ref 12.0–15.0)
MCH: 29.2 pg (ref 26.0–34.0)
MCHC: 33.2 g/dL (ref 30.0–36.0)
MCV: 87.8 fL (ref 78.0–100.0)
PLATELETS: 289 10*3/uL (ref 150–400)
RBC: 4.49 MIL/uL (ref 3.87–5.11)
RDW: 13.1 % (ref 11.5–15.5)
WBC: 8.5 10*3/uL (ref 4.0–10.5)

## 2017-09-08 LAB — TYPE AND SCREEN
ABO/RH(D): O NEG
ANTIBODY SCREEN: NEGATIVE

## 2017-09-08 LAB — BASIC METABOLIC PANEL
Anion gap: 9 (ref 5–15)
BUN: 10 mg/dL (ref 6–20)
CALCIUM: 9.2 mg/dL (ref 8.9–10.3)
CO2: 27 mmol/L (ref 22–32)
Chloride: 100 mmol/L — ABNORMAL LOW (ref 101–111)
Creatinine, Ser: 0.97 mg/dL (ref 0.44–1.00)
GFR calc Af Amer: >60 mL/min (ref >60)
GLUCOSE: 96 mg/dL (ref 65–99)
POTASSIUM: 3.9 mmol/L (ref 3.5–5.1)
Sodium: 136 mmol/L (ref 135–145)

## 2017-09-08 LAB — SURGICAL PCR SCREEN
MRSA, PCR: NEGATIVE
Staphylococcus aureus: NEGATIVE

## 2017-09-08 LAB — ABO/RH: ABO/RH(D): O NEG

## 2017-09-11 ENCOUNTER — Other Ambulatory Visit: Payer: Self-pay | Admitting: Internal Medicine

## 2017-09-14 NOTE — Anesthesia Preprocedure Evaluation (Addendum)
Anesthesia Evaluation  Patient identified by MRN, date of birth, ID band Patient awake    Reviewed: Allergy & Precautions, NPO status , Patient's Chart, lab work & pertinent test results  History of Anesthesia Complications Negative for: history of anesthetic complications  Airway Mallampati: II  TM Distance: >3 FB Neck ROM: Full    Dental  (+) Dental Advisory Given   Pulmonary COPD, former smoker (quit 2012),    breath sounds clear to auscultation       Cardiovascular hypertension, Pt. on medications (-) angina Rhythm:Regular Rate:Normal  '11 ECHO: EF 60%, mild MR   Neuro/Psych Anxiety Depression Chronic back pain: narcotics     GI/Hepatic Neg liver ROS, GERD  Controlled and Medicated,  Endo/Other  Morbid obesity  Renal/GU negative Renal ROS     Musculoskeletal  (+) Arthritis ,   Abdominal (+) + obese,   Peds  Hematology negative hematology ROS (+)   Anesthesia Other Findings   Reproductive/Obstetrics                           Anesthesia Physical Anesthesia Plan  ASA: III  Anesthesia Plan: General   Post-op Pain Management:    Induction: Intravenous  PONV Risk Score and Plan: 4 or greater and Ondansetron, Dexamethasone, Midazolam and Scopolamine patch - Pre-op  Airway Management Planned: Oral ETT  Additional Equipment:   Intra-op Plan:   Post-operative Plan: Extubation in OR  Informed Consent: I have reviewed the patients History and Physical, chart, labs and discussed the procedure including the risks, benefits and alternatives for the proposed anesthesia with the patient or authorized representative who has indicated his/her understanding and acceptance.   Dental advisory given  Plan Discussed with: CRNA and Surgeon  Anesthesia Plan Comments: (Plan routine monitors, GETA Pt originally requested DNR with no respirator, but now understands will need ventilation while in  the OR, agrees to full resuscitation peri-op )        Anesthesia Quick Evaluation

## 2017-09-15 ENCOUNTER — Inpatient Hospital Stay (HOSPITAL_COMMUNITY): Payer: BLUE CROSS/BLUE SHIELD | Admitting: Anesthesiology

## 2017-09-15 ENCOUNTER — Inpatient Hospital Stay (HOSPITAL_COMMUNITY)
Admission: RE | Admit: 2017-09-15 | Discharge: 2017-09-16 | DRG: 460 | Disposition: A | Discharge status: Home / Self Care | Payer: BLUE CROSS/BLUE SHIELD | Source: Ambulatory Visit | Attending: Neurosurgery | Admitting: Neurosurgery

## 2017-09-15 ENCOUNTER — Inpatient Hospital Stay (HOSPITAL_COMMUNITY)
Admission: RE | Disposition: A | Discharge status: Home / Self Care | Payer: Self-pay | Source: Ambulatory Visit | Attending: Neurosurgery

## 2017-09-15 ENCOUNTER — Inpatient Hospital Stay (HOSPITAL_COMMUNITY): Payer: BLUE CROSS/BLUE SHIELD

## 2017-09-15 ENCOUNTER — Encounter (HOSPITAL_COMMUNITY): Payer: Self-pay | Admitting: *Deleted

## 2017-09-15 DIAGNOSIS — Z7951 Long term (current) use of inhaled steroids: Secondary | ICD-10-CM | POA: Diagnosis not present

## 2017-09-15 DIAGNOSIS — M5116 Intervertebral disc disorders with radiculopathy, lumbar region: Secondary | ICD-10-CM | POA: Diagnosis present

## 2017-09-15 DIAGNOSIS — Z791 Long term (current) use of non-steroidal anti-inflammatories (NSAID): Secondary | ICD-10-CM

## 2017-09-15 DIAGNOSIS — F329 Major depressive disorder, single episode, unspecified: Secondary | ICD-10-CM | POA: Diagnosis present

## 2017-09-15 DIAGNOSIS — F419 Anxiety disorder, unspecified: Secondary | ICD-10-CM | POA: Diagnosis present

## 2017-09-15 DIAGNOSIS — Z91048 Other nonmedicinal substance allergy status: Secondary | ICD-10-CM | POA: Diagnosis not present

## 2017-09-15 DIAGNOSIS — M4316 Spondylolisthesis, lumbar region: Secondary | ICD-10-CM | POA: Diagnosis present

## 2017-09-15 DIAGNOSIS — Z9842 Cataract extraction status, left eye: Secondary | ICD-10-CM

## 2017-09-15 DIAGNOSIS — Z9049 Acquired absence of other specified parts of digestive tract: Secondary | ICD-10-CM

## 2017-09-15 DIAGNOSIS — M549 Dorsalgia, unspecified: Secondary | ICD-10-CM | POA: Diagnosis present

## 2017-09-15 DIAGNOSIS — I10 Essential (primary) hypertension: Secondary | ICD-10-CM | POA: Diagnosis present

## 2017-09-15 DIAGNOSIS — J449 Chronic obstructive pulmonary disease, unspecified: Secondary | ICD-10-CM | POA: Diagnosis present

## 2017-09-15 DIAGNOSIS — Z90710 Acquired absence of both cervix and uterus: Secondary | ICD-10-CM

## 2017-09-15 DIAGNOSIS — Z8249 Family history of ischemic heart disease and other diseases of the circulatory system: Secondary | ICD-10-CM

## 2017-09-15 DIAGNOSIS — Z811 Family history of alcohol abuse and dependence: Secondary | ICD-10-CM | POA: Diagnosis not present

## 2017-09-15 DIAGNOSIS — Z8349 Family history of other endocrine, nutritional and metabolic diseases: Secondary | ICD-10-CM | POA: Diagnosis not present

## 2017-09-15 DIAGNOSIS — Z9841 Cataract extraction status, right eye: Secondary | ICD-10-CM | POA: Diagnosis not present

## 2017-09-15 DIAGNOSIS — K219 Gastro-esophageal reflux disease without esophagitis: Secondary | ICD-10-CM | POA: Diagnosis present

## 2017-09-15 DIAGNOSIS — Z87891 Personal history of nicotine dependence: Secondary | ICD-10-CM

## 2017-09-15 DIAGNOSIS — Z881 Allergy status to other antibiotic agents status: Secondary | ICD-10-CM

## 2017-09-15 DIAGNOSIS — Z6831 Body mass index (BMI) 31.0-31.9, adult: Secondary | ICD-10-CM

## 2017-09-15 DIAGNOSIS — Z833 Family history of diabetes mellitus: Secondary | ICD-10-CM | POA: Diagnosis not present

## 2017-09-15 DIAGNOSIS — M48062 Spinal stenosis, lumbar region with neurogenic claudication: Principal | ICD-10-CM | POA: Diagnosis present

## 2017-09-15 DIAGNOSIS — Z801 Family history of malignant neoplasm of trachea, bronchus and lung: Secondary | ICD-10-CM

## 2017-09-15 DIAGNOSIS — Z91041 Radiographic dye allergy status: Secondary | ICD-10-CM

## 2017-09-15 DIAGNOSIS — Z419 Encounter for procedure for purposes other than remedying health state, unspecified: Secondary | ICD-10-CM

## 2017-09-15 SURGERY — POSTERIOR LUMBAR FUSION 2 LEVEL
Anesthesia: General

## 2017-09-15 MED ORDER — FENTANYL CITRATE (PF) 250 MCG/5ML IJ SOLN
INTRAMUSCULAR | Status: AC
  Filled 2017-09-15: qty 5

## 2017-09-15 MED ORDER — ACETAMINOPHEN 325 MG PO TABS
650.0000 mg | ORAL_TABLET | ORAL | Status: DC | PRN
  Administered 2017-09-15: 650 mg via ORAL
  Filled 2017-09-15: qty 2

## 2017-09-15 MED ORDER — ONDANSETRON HCL 4 MG PO TABS: 4.0000 mg | ORAL_TABLET | Freq: Four times a day (QID) | ORAL | Status: DC | PRN

## 2017-09-15 MED ORDER — DEXAMETHASONE SODIUM PHOSPHATE 10 MG/ML IJ SOLN
INTRAMUSCULAR | Status: AC
  Filled 2017-09-15: qty 1

## 2017-09-15 MED ORDER — BUPIVACAINE HCL (PF) 0.5 % IJ SOLN
INTRAMUSCULAR | Status: AC
  Filled 2017-09-15: qty 30

## 2017-09-15 MED ORDER — SODIUM CHLORIDE 0.9% FLUSH
3.0000 mL | Freq: Two times a day (BID) | INTRAVENOUS | Status: DC
  Administered 2017-09-15: 3 mL via INTRAVENOUS

## 2017-09-15 MED ORDER — LIDOCAINE HCL (CARDIAC) 20 MG/ML IV SOLN
INTRAVENOUS | Status: DC | PRN
  Administered 2017-09-15: 30 mg via INTRAVENOUS

## 2017-09-15 MED ORDER — CEFAZOLIN SODIUM-DEXTROSE 2-4 GM/100ML-% IV SOLN
2.0000 g | Freq: Three times a day (TID) | INTRAVENOUS | Status: AC
  Administered 2017-09-15 (×2): 2 g via INTRAVENOUS
  Filled 2017-09-15 (×3): qty 100

## 2017-09-15 MED ORDER — ACETAMINOPHEN 650 MG RE SUPP: 650.0000 mg | RECTAL | Status: DC | PRN

## 2017-09-15 MED ORDER — ONDANSETRON HCL 4 MG/2ML IJ SOLN
INTRAMUSCULAR | Status: DC | PRN
  Administered 2017-09-15: 4 mg via INTRAVENOUS

## 2017-09-15 MED ORDER — BACITRACIN ZINC 500 UNIT/GM EX OINT
TOPICAL_OINTMENT | CUTANEOUS | Status: AC
  Filled 2017-09-15: qty 28.35

## 2017-09-15 MED ORDER — SUGAMMADEX SODIUM 200 MG/2ML IV SOLN
INTRAVENOUS | Status: AC
  Filled 2017-09-15: qty 2

## 2017-09-15 MED ORDER — HYDROCODONE-ACETAMINOPHEN 5-325 MG PO TABS
1.0000 | ORAL_TABLET | ORAL | Status: DC | PRN
  Administered 2017-09-15 – 2017-09-16 (×2): 2 via ORAL
  Filled 2017-09-15 (×2): qty 2

## 2017-09-15 MED ORDER — PROMETHAZINE HCL 25 MG/ML IJ SOLN: 6.2500 mg | INTRAMUSCULAR | Status: DC | PRN

## 2017-09-15 MED ORDER — VANCOMYCIN HCL 1000 MG IV SOLR
INTRAVENOUS | Status: DC | PRN
  Administered 2017-09-15: 1000 mg via TOPICAL

## 2017-09-15 MED ORDER — PROMETHAZINE HCL 25 MG PO TABS: 12.5000 mg | ORAL_TABLET | Freq: Two times a day (BID) | ORAL | Status: DC | PRN

## 2017-09-15 MED ORDER — TIZANIDINE HCL 4 MG PO TABS
4.0000 mg | ORAL_TABLET | Freq: Four times a day (QID) | ORAL | Status: DC | PRN
  Administered 2017-09-15 – 2017-09-16 (×3): 4 mg via ORAL
  Filled 2017-09-15 (×3): qty 1

## 2017-09-15 MED ORDER — FLUOXETINE HCL 20 MG PO CAPS
40.0000 mg | ORAL_CAPSULE | Freq: Every day | ORAL | Status: DC
  Administered 2017-09-15 – 2017-09-16 (×2): 40 mg via ORAL
  Filled 2017-09-15 (×2): qty 2

## 2017-09-15 MED ORDER — MIDAZOLAM HCL 2 MG/2ML IJ SOLN
INTRAMUSCULAR | Status: AC
  Administered 2017-09-15: 1 mg via INTRAVENOUS
  Filled 2017-09-15: qty 2

## 2017-09-15 MED ORDER — CHLORHEXIDINE GLUCONATE CLOTH 2 % EX PADS: 6.0000 | MEDICATED_PAD | Freq: Once | CUTANEOUS | Status: DC

## 2017-09-15 MED ORDER — HYDROMORPHONE HCL 1 MG/ML IJ SOLN
0.2500 mg | INTRAMUSCULAR | Status: DC | PRN
  Administered 2017-09-15 (×4): 0.5 mg via INTRAVENOUS

## 2017-09-15 MED ORDER — SUGAMMADEX SODIUM 200 MG/2ML IV SOLN
INTRAVENOUS | Status: DC | PRN
  Administered 2017-09-15: 180 mg via INTRAVENOUS

## 2017-09-15 MED ORDER — BACITRACIN ZINC 500 UNIT/GM EX OINT
TOPICAL_OINTMENT | CUTANEOUS | Status: DC | PRN
  Administered 2017-09-15: 1 via TOPICAL

## 2017-09-15 MED ORDER — THROMBIN 5000 UNITS EX SOLR
CUTANEOUS | Status: AC
  Filled 2017-09-15: qty 5000

## 2017-09-15 MED ORDER — 0.9 % SODIUM CHLORIDE (POUR BTL) OPTIME
TOPICAL | Status: DC | PRN
  Administered 2017-09-15: 1000 mL

## 2017-09-15 MED ORDER — HYDROMORPHONE HCL 1 MG/ML IJ SOLN
INTRAMUSCULAR | Status: AC
  Administered 2017-09-15: 0.5 mg via INTRAVENOUS
  Filled 2017-09-15: qty 1

## 2017-09-15 MED ORDER — PROPOFOL 10 MG/ML IV BOLUS
INTRAVENOUS | Status: AC
  Filled 2017-09-15: qty 20

## 2017-09-15 MED ORDER — PROPOFOL 10 MG/ML IV BOLUS
INTRAVENOUS | Status: DC | PRN
  Administered 2017-09-15: 80 mg via INTRAVENOUS

## 2017-09-15 MED ORDER — EPHEDRINE SULFATE 50 MG/ML IJ SOLN
INTRAMUSCULAR | Status: DC | PRN
  Administered 2017-09-15 (×5): 10 mg via INTRAVENOUS

## 2017-09-15 MED ORDER — ONDANSETRON HCL 4 MG/2ML IJ SOLN: 4.0000 mg | Freq: Four times a day (QID) | INTRAMUSCULAR | Status: DC | PRN

## 2017-09-15 MED ORDER — OXYCODONE HCL 5 MG PO TABS
5.0000 mg | ORAL_TABLET | ORAL | Status: DC | PRN
  Administered 2017-09-15 – 2017-09-16 (×4): 10 mg via ORAL
  Filled 2017-09-15 (×4): qty 2

## 2017-09-15 MED ORDER — MORPHINE SULFATE (PF) 4 MG/ML IV SOLN
4.0000 mg | INTRAVENOUS | Status: DC | PRN
  Administered 2017-09-15: 4 mg via INTRAVENOUS
  Filled 2017-09-15: qty 1

## 2017-09-15 MED ORDER — SODIUM CHLORIDE 0.9 % IR SOLN
Status: DC | PRN
  Administered 2017-09-15: 09:00:00

## 2017-09-15 MED ORDER — THROMBIN 20000 UNITS EX KIT
PACK | CUTANEOUS | Status: DC | PRN
  Administered 2017-09-15: 20000 [IU] via TOPICAL

## 2017-09-15 MED ORDER — POTASSIUM CHLORIDE ER 10 MEQ PO TBCR
10.0000 meq | EXTENDED_RELEASE_TABLET | Freq: Every day | ORAL | Status: DC
  Administered 2017-09-15 – 2017-09-16 (×2): 10 meq via ORAL
  Filled 2017-09-15 (×4): qty 1

## 2017-09-15 MED ORDER — THROMBIN 5000 UNITS EX SOLR
OROMUCOSAL | Status: DC | PRN
  Administered 2017-09-15: 09:00:00 via TOPICAL

## 2017-09-15 MED ORDER — GABAPENTIN 100 MG PO CAPS
200.0000 mg | ORAL_CAPSULE | Freq: Every day | ORAL | Status: DC
  Administered 2017-09-15: 200 mg via ORAL
  Filled 2017-09-15: qty 2

## 2017-09-15 MED ORDER — DOCUSATE SODIUM 100 MG PO CAPS
100.0000 mg | ORAL_CAPSULE | Freq: Two times a day (BID) | ORAL | Status: DC
  Administered 2017-09-15 – 2017-09-16 (×2): 100 mg via ORAL
  Filled 2017-09-15 (×3): qty 1

## 2017-09-15 MED ORDER — ZOLPIDEM TARTRATE 5 MG PO TABS: 5.0000 mg | ORAL_TABLET | Freq: Every evening | ORAL | Status: DC | PRN

## 2017-09-15 MED ORDER — ALPRAZOLAM 0.5 MG PO TABS
1.0000 mg | ORAL_TABLET | Freq: Two times a day (BID) | ORAL | Status: DC | PRN
  Administered 2017-09-15: 1 mg via ORAL
  Filled 2017-09-15: qty 2

## 2017-09-15 MED ORDER — BISACODYL 10 MG RE SUPP: 10.0000 mg | Freq: Every day | RECTAL | Status: DC | PRN

## 2017-09-15 MED ORDER — SODIUM CHLORIDE 0.9% FLUSH: 3.0000 mL | INTRAVENOUS | Status: DC | PRN

## 2017-09-15 MED ORDER — PRAVASTATIN SODIUM 10 MG PO TABS
10.0000 mg | ORAL_TABLET | Freq: Every day | ORAL | Status: DC
  Administered 2017-09-16: 10 mg via ORAL
  Filled 2017-09-15 (×2): qty 1

## 2017-09-15 MED ORDER — LIDOCAINE 2% (20 MG/ML) 5 ML SYRINGE
INTRAMUSCULAR | Status: AC
  Filled 2017-09-15: qty 5

## 2017-09-15 MED ORDER — ONDANSETRON HCL 4 MG/2ML IJ SOLN
INTRAMUSCULAR | Status: AC
  Filled 2017-09-15: qty 4

## 2017-09-15 MED ORDER — LORATADINE 10 MG PO TABS: 10.0000 mg | ORAL_TABLET | Freq: Every day | ORAL | Status: DC | PRN

## 2017-09-15 MED ORDER — SODIUM CHLORIDE 0.9 % IV SOLN: 250.0000 mL | INTRAVENOUS | Status: DC

## 2017-09-15 MED ORDER — PHENYLEPHRINE HCL 10 MG/ML IJ SOLN
INTRAMUSCULAR | Status: AC
  Filled 2017-09-15: qty 1

## 2017-09-15 MED ORDER — PHENOL 1.4 % MT LIQD: 1.0000 | OROMUCOSAL | Status: DC | PRN

## 2017-09-15 MED ORDER — ROCURONIUM BROMIDE 10 MG/ML (PF) SYRINGE
PREFILLED_SYRINGE | INTRAVENOUS | Status: AC
  Filled 2017-09-15: qty 5

## 2017-09-15 MED ORDER — OXYCODONE HCL 5 MG PO TABS
ORAL_TABLET | ORAL | Status: AC
  Filled 2017-09-15: qty 2

## 2017-09-15 MED ORDER — VANCOMYCIN HCL 1000 MG IV SOLR
INTRAVENOUS | Status: AC
  Filled 2017-09-15: qty 1000

## 2017-09-15 MED ORDER — PANTOPRAZOLE SODIUM 40 MG PO TBEC
40.0000 mg | DELAYED_RELEASE_TABLET | Freq: Two times a day (BID) | ORAL | Status: DC
  Administered 2017-09-15 – 2017-09-16 (×3): 40 mg via ORAL
  Filled 2017-09-15 (×3): qty 1

## 2017-09-15 MED ORDER — MIDAZOLAM HCL 5 MG/5ML IJ SOLN
INTRAMUSCULAR | Status: DC | PRN
  Administered 2017-09-15: 2 mg via INTRAVENOUS

## 2017-09-15 MED ORDER — BUPIVACAINE-EPINEPHRINE (PF) 0.5% -1:200000 IJ SOLN
INTRAMUSCULAR | Status: DC | PRN
  Administered 2017-09-15: 10 mL via PERINEURAL

## 2017-09-15 MED ORDER — MEPERIDINE HCL 25 MG/ML IJ SOLN: 6.2500 mg | INTRAMUSCULAR | Status: DC | PRN

## 2017-09-15 MED ORDER — LACTATED RINGERS IV SOLN
INTRAVENOUS | Status: DC | PRN
  Administered 2017-09-15 (×2): via INTRAVENOUS

## 2017-09-15 MED ORDER — MIDAZOLAM HCL 2 MG/2ML IJ SOLN
INTRAMUSCULAR | Status: AC
  Filled 2017-09-15: qty 2

## 2017-09-15 MED ORDER — FENTANYL CITRATE (PF) 100 MCG/2ML IJ SOLN
INTRAMUSCULAR | Status: DC | PRN
  Administered 2017-09-15: 50 ug via INTRAVENOUS
  Administered 2017-09-15: 200 ug via INTRAVENOUS
  Administered 2017-09-15 (×5): 50 ug via INTRAVENOUS

## 2017-09-15 MED ORDER — ROCURONIUM BROMIDE 100 MG/10ML IV SOLN
INTRAVENOUS | Status: DC | PRN
  Administered 2017-09-15: 50 mg via INTRAVENOUS
  Administered 2017-09-15: 30 mg via INTRAVENOUS
  Administered 2017-09-15: 20 mg via INTRAVENOUS

## 2017-09-15 MED ORDER — DEXAMETHASONE SODIUM PHOSPHATE 10 MG/ML IJ SOLN
INTRAMUSCULAR | Status: DC | PRN
  Administered 2017-09-15: 10 mg via INTRAVENOUS

## 2017-09-15 MED ORDER — MIDAZOLAM HCL 2 MG/2ML IJ SOLN
0.5000 mg | Freq: Once | INTRAMUSCULAR | Status: AC | PRN
  Administered 2017-09-15: 1 mg via INTRAVENOUS

## 2017-09-15 MED ORDER — BUPIVACAINE LIPOSOME 1.3 % IJ SUSP
20.0000 mL | INTRAMUSCULAR | Status: AC
  Administered 2017-09-15: 20 mL
  Filled 2017-09-15: qty 20

## 2017-09-15 MED ORDER — THROMBIN 20000 UNITS EX SOLR
CUTANEOUS | Status: AC
  Filled 2017-09-15: qty 20000

## 2017-09-15 MED ORDER — HYDROCHLOROTHIAZIDE 12.5 MG PO CAPS
12.5000 mg | ORAL_CAPSULE | Freq: Every day | ORAL | Status: DC
  Administered 2017-09-15: 12.5 mg via ORAL
  Filled 2017-09-15 (×2): qty 1

## 2017-09-15 MED ORDER — PHENYLEPHRINE HCL 10 MG/ML IJ SOLN
INTRAMUSCULAR | Status: DC | PRN
  Administered 2017-09-15: 30 ug/min via INTRAVENOUS

## 2017-09-15 MED ORDER — CEFAZOLIN SODIUM-DEXTROSE 2-4 GM/100ML-% IV SOLN
2.0000 g | INTRAVENOUS | Status: AC
  Administered 2017-09-15: 2 g via INTRAVENOUS
  Filled 2017-09-15: qty 100

## 2017-09-15 MED ORDER — FLUTICASONE PROPIONATE 50 MCG/ACT NA SUSP
1.0000 | Freq: Every day | NASAL | Status: DC | PRN
  Filled 2017-09-15: qty 16

## 2017-09-15 MED ORDER — MENTHOL 3 MG MT LOZG: 1.0000 | LOZENGE | OROMUCOSAL | Status: DC | PRN

## 2017-09-15 MED ORDER — PSEUDOEPHEDRINE HCL ER 120 MG PO TB12
120.0000 mg | ORAL_TABLET | Freq: Every day | ORAL | Status: DC | PRN
  Filled 2017-09-15: qty 1

## 2017-09-15 SURGICAL SUPPLY — 69 items
BAG DECANTER FOR FLEXI CONT (MISCELLANEOUS) ×3 IMPLANT
BASKET BONE COLLECTION (BASKET) ×3 IMPLANT
BENZOIN TINCTURE PRP APPL 2/3 (GAUZE/BANDAGES/DRESSINGS) ×3 IMPLANT
BLADE CLIPPER SURG (BLADE) IMPLANT
BUR MATCHSTICK NEURO 3.0 LAGG (BURR) ×3 IMPLANT
BUR PRECISION FLUTE 6.0 (BURR) ×3 IMPLANT
CAGE ALTERA 10X31X9-13 15D (Cage) ×4 IMPLANT
CAGE ALTERA 9-13-15-31MM (Cage) ×2 IMPLANT
CANISTER SUCT 3000ML PPV (MISCELLANEOUS) ×3 IMPLANT
CAP REVERE LOCKING (Cap) ×18 IMPLANT
CARTRIDGE OIL MAESTRO DRILL (MISCELLANEOUS) ×1 IMPLANT
CLOSURE WOUND 1/2 X4 (GAUZE/BANDAGES/DRESSINGS) ×1
CONT SPEC 4OZ CLIKSEAL STRL BL (MISCELLANEOUS) ×3 IMPLANT
COVER BACK TABLE 60X90IN (DRAPES) ×3 IMPLANT
DECANTER SPIKE VIAL GLASS SM (MISCELLANEOUS) ×3 IMPLANT
DIFFUSER DRILL AIR PNEUMATIC (MISCELLANEOUS) ×3 IMPLANT
DRAPE C-ARM 42X72 X-RAY (DRAPES) ×6 IMPLANT
DRAPE HALF SHEET 40X57 (DRAPES) ×3 IMPLANT
DRAPE LAPAROTOMY 100X72X124 (DRAPES) ×3 IMPLANT
DRAPE POUCH INSTRU U-SHP 10X18 (DRAPES) IMPLANT
DRAPE SURG 17X23 STRL (DRAPES) ×12 IMPLANT
ELECT BLADE 4.0 EZ CLEAN MEGAD (MISCELLANEOUS) ×3
ELECT REM PT RETURN 9FT ADLT (ELECTROSURGICAL) ×3
ELECTRODE BLDE 4.0 EZ CLN MEGD (MISCELLANEOUS) ×1 IMPLANT
ELECTRODE REM PT RTRN 9FT ADLT (ELECTROSURGICAL) ×1 IMPLANT
GAUZE SPONGE 4X4 12PLY STRL (GAUZE/BANDAGES/DRESSINGS) ×3 IMPLANT
GAUZE SPONGE 4X4 16PLY XRAY LF (GAUZE/BANDAGES/DRESSINGS) ×3 IMPLANT
GLOVE BIO SURGEON STRL SZ8 (GLOVE) ×6 IMPLANT
GLOVE BIO SURGEON STRL SZ8.5 (GLOVE) ×6 IMPLANT
GLOVE BIOGEL PI IND STRL 7.5 (GLOVE) ×2 IMPLANT
GLOVE BIOGEL PI INDICATOR 7.5 (GLOVE) ×4
GLOVE EXAM NITRILE LRG STRL (GLOVE) IMPLANT
GLOVE EXAM NITRILE XL STR (GLOVE) IMPLANT
GLOVE EXAM NITRILE XS STR PU (GLOVE) IMPLANT
GLOVE SS BIOGEL STRL SZ 7.5 (GLOVE) ×2 IMPLANT
GLOVE SUPERSENSE BIOGEL SZ 7.5 (GLOVE) ×4
GOWN STRL REUS W/ TWL LRG LVL3 (GOWN DISPOSABLE) ×2 IMPLANT
GOWN STRL REUS W/ TWL XL LVL3 (GOWN DISPOSABLE) ×3 IMPLANT
GOWN STRL REUS W/TWL 2XL LVL3 (GOWN DISPOSABLE) IMPLANT
GOWN STRL REUS W/TWL LRG LVL3 (GOWN DISPOSABLE) ×4
GOWN STRL REUS W/TWL XL LVL3 (GOWN DISPOSABLE) ×6
HEMOSTAT POWDER KIT SURGIFOAM (HEMOSTASIS) ×3 IMPLANT
KIT BASIN OR (CUSTOM PROCEDURE TRAY) ×3 IMPLANT
KIT ROOM TURNOVER OR (KITS) ×3 IMPLANT
MILL MEDIUM DISP (BLADE) ×3 IMPLANT
NEEDLE HYPO 21X1.5 SAFETY (NEEDLE) ×3 IMPLANT
NEEDLE HYPO 22GX1.5 SAFETY (NEEDLE) ×3 IMPLANT
NS IRRIG 1000ML POUR BTL (IV SOLUTION) ×3 IMPLANT
OIL CARTRIDGE MAESTRO DRILL (MISCELLANEOUS) ×3
PACK LAMINECTOMY NEURO (CUSTOM PROCEDURE TRAY) ×3 IMPLANT
PAD ARMBOARD 7.5X6 YLW CONV (MISCELLANEOUS) ×9 IMPLANT
PATTIES SURGICAL .5 X1 (DISPOSABLE) IMPLANT
PUTTY KINEX BIOACTIVE 5CC (Bone Implant) ×3 IMPLANT
ROD REVERE 7.5MM (Rod) ×6 IMPLANT
SCREW 7.5X50MM (Screw) ×18 IMPLANT
SPONGE LAP 4X18 X RAY DECT (DISPOSABLE) IMPLANT
SPONGE NEURO XRAY DETECT 1X3 (DISPOSABLE) IMPLANT
SPONGE SURGIFOAM ABS GEL 100 (HEMOSTASIS) ×3 IMPLANT
STRIP BIOACTIVE 20CC 25X100X8 (Miscellaneous) ×3 IMPLANT
STRIP CLOSURE SKIN 1/2X4 (GAUZE/BANDAGES/DRESSINGS) ×2 IMPLANT
SUT VIC AB 1 CT1 18XBRD ANBCTR (SUTURE) ×2 IMPLANT
SUT VIC AB 1 CT1 8-18 (SUTURE) ×4
SUT VIC AB 2-0 CP2 18 (SUTURE) ×6 IMPLANT
TAPE CLOTH SURG 4X10 WHT LF (GAUZE/BANDAGES/DRESSINGS) ×3 IMPLANT
TOWEL GREEN STERILE (TOWEL DISPOSABLE) ×3 IMPLANT
TOWEL GREEN STERILE FF (TOWEL DISPOSABLE) ×3 IMPLANT
TRAY FOLEY METER SIL LF 16FR (CATHETERS) ×3 IMPLANT
TRAY FOLEY W/METER SILVER 16FR (SET/KITS/TRAYS/PACK) IMPLANT
WATER STERILE IRR 1000ML POUR (IV SOLUTION) ×3 IMPLANT

## 2017-09-15 NOTE — H&P (Signed)
Subjective: The patient is a 61 year old white female who complains of back, buttock and leg pain consistent with neurogenic claudication. She has failed medical management and was worked up with a lumbar MRI and lumbar x-rays. This demonstrated a L3-4 and L4-5 spondylolisthesis and spinal stenosis. I discussed the various treatment options with the patient. She has decided to proceed with surgery.   Past Medical History:  Diagnosis Date  . Allergy   . Anxiety   . Arthritis   . Cancer (HCC)    basal cell nose  . Chronic back pain    degenerative disc disease  . Degenerative disc disease, lumbar   . Depression   . Dyspnea    with exertion / pain  . Gallstones   . GERD (gastroesophageal reflux disease)   . Heart murmur    followed by PCP  . Hx of colonic polyp   . Hx: UTI (urinary tract infection)   . Hypercholesterolemia   . Hypertension   . Panic attacks    H/O  . Wears dentures    partial upper and lower    Past Surgical History:  Procedure Laterality Date  . ABDOMINAL HYSTERECTOMY  1992   partial  . CATARACT EXTRACTION Bilateral [id number]  . CHOLECYSTECTOMY  04-19-14  . COLONOSCOPY  07/02/11   DR. Byrnett  . COLONOSCOPY WITH PROPOFOL N/A 09/25/2015   Procedure: COLONOSCOPY WITH PROPOFOL;  Surgeon: Lucilla Lame, MD;  Location: [facility];  Service: Endoscopy;  Laterality: N/A;  . FOOT SURGERY Right 2009  . MOUTH SURGERY    . POLYPECTOMY  09/25/2015   Procedure: POLYPECTOMY;  Surgeon: Lucilla Lame, MD;  Location: [facility];  Service: Endoscopy;;  . skin surgery Left 08/27/13   Basal cell removal-left nostril  . TONSILLECTOMY AND ADENOIDECTOMY  1969    Allergies  Allergen Reactions  . Levaquin [Levofloxacin In D5w] Other (See Comments)    Muscle pain  . Adhesive [Tape] Other (See Comments)    Pulls skin, anything that does not pull is recommended   . Doxycycline Nausea And Vomiting  . Ivp Dye [Iodinated Diagnostic Agents] Nausea And Vomiting and  Other (See Comments)     Eyes turned blood red    Social History  Substance Use Topics  . Smoking status: Former Smoker    Packs/day: 1.00    Years: 37.00    Quit date: 2012  . Smokeless tobacco: Never Used  . Alcohol use 0.6 oz/week    1 Glasses of wine per week    Family History  Problem Relation Age of Onset  . Hyperlipidemia Mother   . Hypertension Mother   . Alcohol abuse Father   . Hyperlipidemia Father   . Heart disease Father   . Diabetes Father   . Hyperlipidemia Sister   . Lung cancer Brother   . Hyperlipidemia Brother    Prior to Admission medications   Medication Sig Start Date End Date Taking? Authorizing Provider  ALPRAZolam (XANAX) 1 MG tablet TAKE (1) TABLET TWICE A DAY. Patient taking differently: Take 1 mg by mouth at bedtime. Take 1 mg by mouth daily as needed for anxiety 08/17/17  Yes Einar Pheasant, MD  FLUoxetine (PROZAC) 40 MG capsule Take 1 capsule (40 mg total) by mouth daily. 09/12/17  Yes Einar Pheasant, MD  fluticasone (FLONASE) 50 MCG/ACT nasal spray Place 1 spray into both nostrils daily as needed for allergies. Reported on 11/25/2015 12/24/13  Yes [provider]  gabapentin (NEURONTIN) 100 MG capsule Take 200  mg by mouth at bedtime.  02/09/17  Yes [provider]  hydrochlorothiazide (MICROZIDE) 12.5 MG capsule TAKE ONE CAPSULE DAILY. Patient taking differently: Take 12.5 mg by mouth once daily 06/13/17  Yes Einar Pheasant, MD  HYDROcodone-acetaminophen (NORCO/VICODIN) 5-325 MG per tablet Take 2 tablets by mouth 2 (two) times daily as needed for moderate pain.  04/22/15  Yes [provider]  loratadine (CLARITIN) 10 MG tablet Take 10 mg by mouth daily as needed for allergies.   Yes [provider]  meloxicam (MOBIC) 15 MG tablet Take 1 tablet (15 mg total) by mouth daily. 08/31/17  Yes Einar Pheasant, MD  Naphazoline-Pheniramine (OPCON-A OP) Place 2 drops into both eyes as needed (for dry eyes).   Yes [provider]  pantoprazole (PROTONIX) 40 MG tablet Take 1 tablet (40 mg total) by mouth daily. 08/15/17  Yes Einar Pheasant, MD  potassium chloride (K-DUR) 10 MEQ tablet Take 1 tablet (10 mEq total) by mouth daily. 07/26/17  Yes Einar Pheasant, MD  pravastatin (PRAVACHOL) 10 MG tablet Take 1 tablet (10 mg total) by mouth daily. 08/31/17  Yes Einar Pheasant, MD  tiZANidine (ZANAFLEX) 4 MG tablet Take 2-4 mg by mouth 3 times daily as needed for muscle spasms 02/08/17  Yes [provider]  hydrocortisone (ANUSOL-HC) 25 MG suppository Place 1 suppository (25 mg total) rectally 2 (two) times daily. Patient not taking: Reported on 09/05/2017 11/28/15   Einar Pheasant, MD  promethazine (PHENERGAN) 12.5 MG tablet Take 1 tablet (12.5 mg total) by mouth 2 (two) times daily as needed for nausea or vomiting. 07/20/17   Einar Pheasant, MD  Pseudoephedrine HCl 240 MG TB24 Take 240 mg by mouth daily as needed (for congestion). Reported on 05/18/2016    [provider]     Review of Systems  Positive ROS: As above  All other systems have been reviewed and were otherwise negative with the exception of those mentioned in the HPI and as above.  Objective: Vital signs in last 24 hours: Temp:  [98.4 F (36.9 C)] 98.4 F (36.9 C) (10/11 5400) Pulse Rate:  [69] 69 (10/11 0638) Resp:  [20] 20 (10/11 0638) BP: (123)/(73) 123/73 (10/11 0638) SpO2:  [96 %] 96 % (10/11 0638) Weight:  [87.1 kg (192 lb)] 87.1 kg (192 lb) (10/11 8676)  General Appearance: Alert Head: Normocephalic, without obvious abnormality, atraumatic Eyes: PERRL, conjunctiva/corneas clear, EOM's intact,    Ears: Normal  Throat: Normal  Neck: Supple, Back: unremarkable Lungs: Clear to auscultation bilaterally, respirations unlabored Heart: Regular rate and rhythm, no murmur, rub or gallop Abdomen: Soft, non-tender Extremities: Extremities normal, atraumatic, no cyanosis or edema Skin: unremarkable  NEUROLOGIC:    Mental status: alert and oriented,Motor Exam - grossly normal Sensory Exam - grossly normal Reflexes:  Coordination - grossly normal Gait - grossly normal Balance - grossly normal Cranial Nerves: I: smell Not tested  II: visual acuity  OS: Normal  OD: Normal   II: visual fields Full to confrontation  II: pupils Equal, round, reactive to light  III,VII: ptosis None  III,IV,VI: extraocular muscles  Full ROM  V: mastication Normal  V: facial light touch sensation  Normal  V,VII: corneal reflex  Present  VII: facial muscle function - upper  Normal  VII: facial muscle function - lower Normal  VIII: hearing Not tested  IX: soft palate elevation  Normal  IX,X: gag reflex Present  XI: trapezius strength  5/5  XI: sternocleidomastoid strength 5/5  XI: neck flexion strength  5/5  XII: tongue strength  Normal    Data Review Lab Results  Component Value Date   WBC 8.5 09/08/2017   HGB 13.1 09/08/2017   HCT 39.4 09/08/2017   MCV 87.8 09/08/2017   PLT 289 09/08/2017   Lab Results  Component Value Date   NA 136 09/08/2017   K 3.9 09/08/2017   CL 100 (L) 09/08/2017   CO2 27 09/08/2017   BUN 10 09/08/2017   CREATININE 0.97 09/08/2017   GLUCOSE 96 09/08/2017   No results found for: INR, PROTIME  Assessment/Plan: L3-4 and L4-5 spinal stenosis, spinal stenosis, lumbar radiculopathy, lumbago, neurogenic claudication: I have discussed the situation with the patient and reviewed her imaging studies with her. We have discussed the various treatment options including surgery. I have described the surgical treatment option of an L3-4 and L4-5 decompression, instrumentation, and fusion. I have shown her surgical models. I have given her surgical pamphlet. We have discussed the risks, benefits, alternatives, expected postoperative course, and likelihood of achieving our goals with surgery. I have answered all her questions. She has decided to proceed with surgery.   Yogesh Cominsky  D 09/15/2017 7:21 AM

## 2017-09-15 NOTE — Transfer of Care (Signed)
Immediate Anesthesia Transfer of Care Note  Patient: Angel Riddle  Procedure(s) Performed: POSTERIOR LUMBAR  INTERBODY FUSION, INTERBODY PROSTHESIS,POSTERIOR INSTRUMENTATION, LUMBAR THREE-FOUR LUMBAR FOUR-FIVE (N/A )  Patient Location: PACU  Anesthesia Type:General  Level of Consciousness: awake and patient cooperative  Airway & Oxygen Therapy: Patient Spontanous Breathing  Post-op Assessment: Report given to RN and Post -op Vital signs reviewed and stable  Post vital signs: Reviewed and stable  Last Vitals:  Vitals:   09/15/17 0638  BP: 123/73  Pulse: 69  Resp: 20  Temp: 36.9 C  SpO2: 96%    Last Pain:  Vitals:   09/15/17 0638  TempSrc: Oral  PainSc: 5          Complications: No apparent anesthesia complications

## 2017-09-15 NOTE — Op Note (Signed)
Brief history: The patient is a 61 year old white female who has complained of chronic back and leg pain. She has failed medical management. She was worked up with a lumbar MRI, lumbar myelo CT and lumbar x-rays. This demonstrated spinal stenosis and degenerative disc disease at L3-4 and L4-5. I discussed the various treatment options with patient. She has decided to proceed with surgery.  Preoperative diagnosis: L3-4 and L4-5 degenerative disc disease,spinal stenosis compressing the L3, L4 and L5 nerve roots; lumbago; lumbar radiculopathy  Postoperative diagnosis: The same  Procedure: Bilateral L3-4 and L4-5 Laminotomy/foraminotomies to decompress the bilateral L3, L4 and L5 nerve roots(the work required to do this was in addition to the work required to do the posterior lumbar interbody fusion because of the patient's spinal stenosis, facet arthropathy. Etc. requiring a wide decompression of the nerve roots.); L3-4 and L4-5 transforaminal lumbar interbody fusion with local morselized autograft bone and Kinnex graft extender; insertion of interbody prosthesis at L3-4 and L4-5 (globus peek expandable interbody prosthesis); posterior segmental instrumentation from L3 to L5 with globus titanium pedicle screws and rods; posterior lateral arthrodesis at L3-4 and L4-5 with local morselized autograft bone and Kinnex bone graft extender.  Surgeon: Dr. Earle Gell  Asst.: Dr. Cyndy Freeze  Anesthesia: Gen. endotracheal  Estimated blood loss: 250 mL  Drains: None  Complications: None  Description of procedure: The patient was brought to the operating room by the anesthesia team. General endotracheal anesthesia was induced. The patient was turned to the prone position on the Wilson frame. The patient's lumbosacral region was then prepared with Betadine scrub and Betadine solution. Sterile drapes were applied.  I then injected the area to be incised with Marcaine with epinephrine solution. I then used the  scalpel to make a linear midline incision over the L3-4 and L4-5 interspace. I then used electrocautery to perform a bilateral subperiosteal dissection exposing the spinous process and lamina of L3, L4 and L5. We then obtained intraoperative radiograph to confirm our location. We then inserted the Verstrac retractor to provide exposure.  I began the decompression by using the high speed drill to perform laminotomies at L3-4 and L4-5 bilaterally. We then used the Kerrison punches to widen the laminotomy and removed the ligamentum flavum at L3-4 and L4-5 bilaterally. We used the Kerrison punches to remove the medial facets at L3-4 and L4-5 bilaterally. We performed wide foraminotomies about the bilateral L3, L4 and L5 nerve roots completing the decompression.  We now turned our attention to the posterior lumbar interbody fusion. I used a scalpel to incise the intervertebral disc at L3-4 and L4-5 bilaterally. I then performed a partial intervertebral discectomy at L3-4 and L4-5 bilaterally using the pituitary forceps. We prepared the vertebral endplates at Z6-1 and W9-6 bilaterally for the fusion by removing the soft tissues with the curettes. We then used the trial spacers to pick the appropriate sized interbody prosthesis. We prefilled his prosthesis with a combination of local morselized autograft bone that we obtained during the decompression as well as Kinnex bone graft extender. We inserted the prefilled prosthesis into the interspace at L3-4 and L4-5, we then expanded the prosthesis. There was a good snug fit of the prosthesis in the interspace. We then filled and the remainder of the intervertebral disc space with local morselized autograft bone and Kinnex. This completed the posterior lumbar interbody arthrodesis.  We now turned attention to the instrumentation. Under fluoroscopic guidance we cannulated the bilateral L3, L4 and L5 pedicles with the bone probe. We then  removed the bone probe. We then  tapped the pedicle with a 6.5 millimeter tap. We then removed the tap. We probed inside the tapped pedicle with a ball probe to rule out cortical breaches. We then inserted a 7.5 x 50 millimeter pedicle screw into the L3, L4 and L5 pedicles bilaterally under fluoroscopic guidance. We then palpated along the medial aspect of the pedicles to rule out cortical breaches. There were none. The nerve roots were not injured. We then connected the unilateral pedicle screws with a lordotic rod. We compressed the construct and secured the rod in place with the caps. We then tightened the caps appropriately. This completed the instrumentation from L3-L5 bilaterally.  We now turned our attention to the posterior lateral arthrodesis at L3-4 and L4-5 bilaterally. We used the high-speed drill to decorticate the remainder of the facets, pars, transverse process at L3-4 and L4-5 bilaterally. We then applied a combination of local morselized autograft bone and Kinnex bone graft extender over these decorticated posterior lateral structures. This completed the posterior lateral arthrodesis.  We then obtained hemostasis using bipolar electrocautery. We irrigated the wound out with bacitracin solution. We inspected the thecal sac and nerve roots and noted they were well decompressed. We then removed the retractor. We placed vancomycin powder in the wound. We reapproximated patient's thoracolumbar fascia with interrupted #1 Vicryl suture. We reapproximated patient's subcutaneous tissue with interrupted 2-0 Vicryl suture. The reapproximated patient's skin with Steri-Strips and benzoin. The wound was then coated with bacitracin ointment. A sterile dressing was applied. The drapes were removed. The patient was subsequently returned to the supine position where they were extubated by the anesthesia team. He was then transported to the post anesthesia care unit in stable condition. All sponge instrument and needle counts were reportedly  correct at the end of this case.

## 2017-09-15 NOTE — Evaluation (Signed)
Physical Therapy Evaluation Patient Details Name: Angel Riddle MRN: [id number] DOB: June 05, 1956 Today's Date: 09/15/2017   History of Present Illness  Pt is a 61 y/o female s/p L3-5 PLIF. PMH includes anxiety, cancer, depression, heart murmur, HTN, and R foot surgery.   Clinical Impression  Patient is s/p above surgery resulting in the deficits listed below (see PT Problem List). PTA, pt was independent with functional mobility. Upon eval, pt very guarded secondary to pain and presenting with weakness and decreased balance. Pt requiring min A for mobility this session. Recommending 3 in 1 for increased independence with mobility. Reports husband will be available at d/c. Patient will benefit from skilled PT to increase their independence and safety with mobility (while adhering to their precautions) to allow discharge to the venue listed below. Will continue to follow acutely to maximize functional mobility independence and safety.      Follow Up Recommendations No PT follow up    Equipment Recommendations  3in1 (PT)    Recommendations for Other Services OT consult     Precautions / Restrictions Precautions Precautions: Back Precaution Booklet Issued: Yes (comment) Precaution Comments: Reviewed back precautions with pt.  Required Braces or Orthoses: Spinal Brace Spinal Brace: Lumbar corset;Applied in sitting position Restrictions Weight Bearing Restrictions: No      Mobility  Bed Mobility Overal bed mobility: Needs Assistance Bed Mobility: Rolling;Sidelying to Sit;Sit to Sidelying Rolling: Supervision Sidelying to sit: Min guard     Sit to sidelying: Min guard General bed mobility comments: Min guard to ensure proper log roll technique.   Transfers Overall transfer level: Needs assistance Equipment used: 1 person hand held assist Transfers: Sit to/from Stand Sit to Stand: Min assist         General transfer comment: Min A for lift assist and steadying assist.  Verbal cues to power through LEs.   Ambulation/Gait Ambulation/Gait assistance: Min assist Ambulation Distance (Feet): 100 Feet Assistive device: 1 person hand held assist (IV pole ) Gait Pattern/deviations: Step-through pattern;Decreased stride length Gait velocity: Decreased Gait velocity interpretation: Below normal speed for age/gender General Gait Details: Very slow, guarded gait. Mild unsteadiness requiring min guard assist. Educated about generalized walking program to perform at home.   Stairs            Wheelchair Mobility    Modified Rankin (Stroke Patients Only)       Balance Overall balance assessment: Needs assistance Sitting-balance support: No upper extremity supported;Feet supported Sitting balance-Leahy Scale: Good     Standing balance support: Bilateral upper extremity supported;Single extremity supported;During functional activity Standing balance-Leahy Scale: Poor Standing balance comment: Reliant on UE support for balance.                              Pertinent Vitals/Pain Pain Assessment: 0-10 Pain Score: 3  Pain Location: back  Pain Descriptors / Indicators: Aching;Operative site guarding Pain Intervention(s): Limited activity within patient's tolerance;Monitored during session;Repositioned    Home Living Family/patient expects to be discharged to:: Private residence Living Arrangements: Spouse/significant other Available Help at Discharge: Family;Available 24 hours/day Type of Home: House Home Access: Stairs to enter Entrance Stairs-Rails: Right Entrance Stairs-Number of Steps: 3 Home Layout: Two level;Able to live on main level with bedroom/bathroom Home Equipment: Kasandra Knudsen - single point;Shower seat - built in      Prior Function Level of Independence: Architect  Dominance        Extremity/Trunk Assessment   Upper Extremity Assessment Upper Extremity Assessment: Defer to OT evaluation     Lower Extremity Assessment Lower Extremity Assessment: Generalized weakness    Cervical / Trunk Assessment Cervical / Trunk Assessment: Other exceptions Cervical / Trunk Exceptions: s/p PLIF   Communication   Communication: No difficulties  Cognition Arousal/Alertness: Awake/alert Behavior During Therapy: WFL for tasks assessed/performed Overall Cognitive Status: Within Functional Limits for tasks assessed                                        General Comments General comments (skin integrity, edema, etc.): Educated about DME recommendations and pt agreeable.     Exercises     Assessment/Plan    PT Assessment Patient needs continued PT services  PT Problem List Decreased strength;Decreased balance;Decreased mobility;Decreased knowledge of use of DME;Decreased knowledge of precautions;Pain       PT Treatment Interventions DME instruction;Gait training;Stair training;Therapeutic activities;Functional mobility training;Therapeutic exercise;Balance training;Neuromuscular re-education;Patient/family education    PT Goals (Current goals can be found in the Care Plan section)  Acute Rehab PT Goals Patient Stated Goal: to decrease pain  PT Goal Formulation: With patient Time For Goal Achievement: 09/22/17 Potential to Achieve Goals: Good    Frequency Min 5X/week   Barriers to discharge        Co-evaluation               AM-PAC PT "6 Clicks" Daily Activity  Outcome Measure Difficulty turning over in bed (including adjusting bedclothes, sheets and blankets)?: A Little Difficulty moving from lying on back to sitting on the side of the bed? : Unable Difficulty sitting down on and standing up from a chair with arms (e.g., wheelchair, bedside commode, etc,.)?: Unable Help needed moving to and from a bed to chair (including a wheelchair)?: A Little Help needed walking in hospital room?: A Little Help needed climbing 3-5 steps with a railing? : A Lot 6  Click Score: 13    End of Session Equipment Utilized During Treatment: Gait belt;Back brace Activity Tolerance: Patient tolerated treatment well Patient left: in bed;with call bell/phone within reach;with family/visitor present Nurse Communication: Mobility status PT Visit Diagnosis: Other abnormalities of gait and mobility (R26.89);Pain Pain - part of body:  (back )    Time: 1535-1600 PT Time Calculation (min) (ACUTE ONLY): 25 min   Charges:   PT Evaluation $PT Eval Low Complexity: 1 Low PT Treatments $Gait Training: 8-22 mins   PT G Codes:        Leighton Ruff, PT, DPT  Acute Rehabilitation Services  Pager: [phone]   Rudean Hitt 09/15/2017, 4:08 PM

## 2017-09-15 NOTE — Anesthesia Procedure Notes (Signed)
Procedure Name: Intubation Date/Time: 09/15/2017 7:44 AM Performed by: Lance Coon Pre-anesthesia Checklist: Patient identified, Emergency Drugs available, Suction available, Patient being monitored and Timeout performed Patient Re-evaluated:Patient Re-evaluated prior to induction Oxygen Delivery Method: Circle system utilized Preoxygenation: Pre-oxygenation with 100% oxygen Induction Type: IV induction Ventilation: Mask ventilation without difficulty Laryngoscope Size: Miller and 3 Grade View: Grade II Tube type: Oral Tube size: 7.5 mm Number of attempts: 1 Airway Equipment and Method: Stylet Placement Confirmation: ETT inserted through vocal cords under direct vision,  positive ETCO2 and breath sounds checked- equal and bilateral Secured at: 21 cm Tube secured with: Tape Dental Injury: Teeth and Oropharynx as per pre-operative assessment

## 2017-09-15 NOTE — Anesthesia Postprocedure Evaluation (Signed)
Anesthesia Post Note  Patient: Jameriah A Benefiel  Procedure(s) Performed: POSTERIOR LUMBAR  INTERBODY FUSION, INTERBODY PROSTHESIS,POSTERIOR INSTRUMENTATION, LUMBAR THREE-FOUR LUMBAR FOUR-FIVE (N/A )     Patient location during evaluation: PACU Anesthesia Type: General Level of consciousness: awake and alert, patient cooperative and oriented Pain management: pain level controlled (pain improving, pt appreciative) Vital Signs Assessment: post-procedure vital signs reviewed and stable Respiratory status: spontaneous breathing, nonlabored ventilation, respiratory function stable and patient connected to nasal cannula oxygen Cardiovascular status: blood pressure returned to baseline and stable Postop Assessment: no apparent nausea or vomiting Anesthetic complications: no    Last Vitals:  Vitals:   09/15/17 1308 09/15/17 1323  BP: (!) 120/53 (!) 117/54  Pulse: (!) 105 (!) 105  Resp: 11 (!) 9  Temp:    SpO2: 100% 100%    Last Pain:  Vitals:   09/15/17 1258  TempSrc:   PainSc: 7                  Ayliana Casciano,E. Lorien Shingler

## 2017-09-15 NOTE — Progress Notes (Signed)
Orthopedic Tech Progress Note Patient Details:  Angel Riddle [birth year] [id number] Patient has brace. Patient ID: [name], female   DOB: 1956-03-11, 61 y.o.   MRN: [id number]   [name] 09/15/2017, 2:11 PM

## 2017-09-16 LAB — CBC
HCT: 29.8 % — ABNORMAL LOW (ref 36.0–46.0)
HEMOGLOBIN: 9.9 g/dL — AB (ref 12.0–15.0)
MCH: 29.5 pg (ref 26.0–34.0)
MCHC: 33.2 g/dL (ref 30.0–36.0)
MCV: 88.7 fL (ref 78.0–100.0)
PLATELETS: 240 10*3/uL (ref 150–400)
RBC: 3.36 MIL/uL — AB (ref 3.87–5.11)
RDW: 13.3 % (ref 11.5–15.5)
WBC: 12.4 10*3/uL — AB (ref 4.0–10.5)

## 2017-09-16 LAB — BASIC METABOLIC PANEL
ANION GAP: 7 (ref 5–15)
BUN: 10 mg/dL (ref 6–20)
CHLORIDE: 100 mmol/L — AB (ref 101–111)
CO2: 29 mmol/L (ref 22–32)
Calcium: 8.8 mg/dL — ABNORMAL LOW (ref 8.9–10.3)
Creatinine, Ser: 0.86 mg/dL (ref 0.44–1.00)
Glucose, Bld: 134 mg/dL — ABNORMAL HIGH (ref 65–99)
POTASSIUM: 4.1 mmol/L (ref 3.5–5.1)
SODIUM: 136 mmol/L (ref 135–145)

## 2017-09-16 MED ORDER — OXYCODONE HCL 5 MG PO TABS: 5.0000 mg | ORAL_TABLET | ORAL | 0 refills | Status: DC | PRN

## 2017-09-16 MED ORDER — DOCUSATE SODIUM 100 MG PO CAPS: 100.0000 mg | ORAL_CAPSULE | Freq: Two times a day (BID) | ORAL | 0 refills | Status: DC

## 2017-09-16 MED FILL — Sodium Chloride IV Soln 0.9%: INTRAVENOUS | Qty: 1000 | Status: AC

## 2017-09-16 MED FILL — Thrombin For Soln 20000 Unit: CUTANEOUS | Qty: 1 | Status: AC

## 2017-09-16 MED FILL — Heparin Sodium (Porcine) Inj 1000 Unit/ML: INTRAMUSCULAR | Qty: 30 | Status: AC

## 2017-09-16 NOTE — Progress Notes (Signed)
Occupational Therapy Evaluation and Treatment Patient Details Name: GLENDELL SCHLOTTMAN MRN: [id number] DOB: Jul 31, 1956 Today's Date: 09/16/2017    History of Present Illness Pt is a 61 y/o female s/p L3-5 PLIF. PMH includes anxiety, cancer, depression, heart murmur, HTN, and R foot surgery.    Clinical Impression   Pt seen this am although had to return later to complete education due to lethargy related to medication. Completed all education regarding compensatory techniques for ADL and functional mobility for ADL adhering to back precautions. Family present for education. Pt safe to DC home when medically stable.     Follow Up Recommendations  CIR;Supervision/Assistance - 24 hour    Equipment Recommendations  Other (comment) (TBA at rehab)    Recommendations for Other Services Rehab consult     Precautions / Restrictions Precautions Precautions: Back Precaution Booklet Issued: Yes (comment) Precaution Comments: Pt was cued for precautions during functional mobility.  Required Braces or Orthoses: Spinal Brace Spinal Brace: Lumbar corset;Applied in sitting position Restrictions Weight Bearing Restrictions: No      Mobility Bed Mobility Overal bed mobility: Modified Independent Bed Mobility: Rolling;Sidelying to Sit;Sit to Sidelying Rolling: Modified independent (Device/Increase time)      Sit to sidelying:  General bed mobility comments: HOB flat and rails lowered to simulate home environment. VC's for proper log roll technique.  good technique  Transfers Overall transfer level: Modified independent        General transfer comment: VC's for hand placement on seated surface for safety. Pt did not require assist to power-up to full standing position.     Balance Overall balance assessment: Needs assistance Sitting-balance support: No upper extremity supported;Feet supported Sitting balance-Leahy Scale: Good     Standing balance support: Bilateral upper extremity  supported;Single extremity supported;During functional activity Standing balance-Leahy Scale: Fair Standing balance comment: better with RW per pt                           ADL either performed or assessed with clinical judgement   ADL Overall ADL's : Needs assistance/impaired                                     Functional mobility during ADLs: Modified independent;Rolling walker General ADL Comments: Completed education regarding compensatory techqnieus for ADL, including AE and techniqeus for pericare.  REcommend use of 3in1 as shower chair adn over the toilet . Pt/family verbalized understanidng.      Vision         Perception     Praxis      Pertinent Vitals/Pain Pain Assessment: Faces Faces Pain Scale: Hurts little more Pain Location: back  Pain Descriptors / Indicators: Aching;Operative site guarding Pain Intervention(s): Limited activity within patient's tolerance;Monitored during session;Repositioned     Hand Dominance     Extremity/Trunk Assessment         Cervical / Trunk Assessment Cervical / Trunk Assessment: Other exceptions Cervical / Trunk Exceptions: s/p PLIF    Communication Communication Communication: No difficulties   Cognition Arousal/Alertness: Awake/alert Behavior During Therapy: WFL for tasks assessed/performed Overall Cognitive Status: Within Functional Limits for tasks assessed                                 General Comments: Pt reports she still feels "loopy" from her pain medications.  Noted decreased attention at times and need for increased cues - however, grossly [facility]   General Comments       Exercises     Shoulder Instructions      Home Living Family/patient expects to be discharged to:: Private residence Living Arrangements: Spouse/significant other Available Help at Discharge: Family;Available 24 hours/day Type of Home: House Home Access: Stairs to enter [employer] of  Steps: 3 Entrance Stairs-Rails: Right Home Layout: Two level;Able to live on main level with bedroom/bathroom     Bathroom Shower/Tub: Occupational psychologist: Standard Bathroom Accessibility: Yes How Accessible: Accessible via walker Home Equipment: [place] - single point;Shower seat - built in          Prior Functioning/Environment Level of Independence: Independent                 OT Problem List: Decreased strength;Decreased range of motion;Decreased safety awareness;Decreased knowledge of use of DME or AE;Decreased knowledge of precautions;Obesity;Pain      OT Treatment/Interventions:      OT Goals(Current goals can be found in the care plan section) Acute Rehab OT Goals Patient Stated Goal: to decrease pain  OT Goal Formulation: All assessment and education complete, DC therapy  OT Frequency:     Barriers to D/C:            Co-evaluation              AM-PAC PT "6 Clicks" Daily Activity     Outcome Measure Help from another person eating meals?: None Help from another person taking care of personal grooming?: None Help from another person toileting, which includes using toliet, bedpan, or urinal?: A Little Help from another person bathing (including washing, rinsing, drying)?: A Little Help from another person to put on and taking off regular upper body clothing?: None Help from another person to put on and taking off regular lower body clothing?: A Little 6 Click Score: 21   End of Session Equipment Utilized During Treatment: Rolling walker;Back brace Nurse Communication: Other (comment) (ready for DC)  Activity Tolerance: Patient tolerated treatment well Patient left: in bed;with call bell/phone within reach;with family/visitor present  OT Visit Diagnosis: Pain;Muscle weakness (generalized) (M62.81) Pain - part of body:  (back)                Time: 8768-1157 OT Time Calculation (min): 15 min  2nd session 1128 - 1148 Min 20 min   Charges:  OT General Charges $OT Visit:  (2 visits) OT Evaluation $OT Eval Low Complexity: 1 Low OT Treatments $Self Care/Home Management : 8-22 mins G-Codes:     [facility], OT/L  [phone] 09/16/2017  Whitt Auletta,HILLARY 09/16/2017, 1:08 PM

## 2017-09-16 NOTE — Discharge Summary (Signed)
Physician Discharge Summary  Patient ID: Angel Riddle MRN: [id number] DOB/AGE: 1956/10/26 61 y.o.  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 The surgery went well.  The patient's postoperative course was unremarkable. The planned is to send her home later on today if she mobilizes well with physical therapy. I gave the patient and her sister discharge instructions and answered all their questions.  Consults: Physical therapy Significant Diagnostic Studies: None Treatments: L3-4 and L4-5 decompression, instrumentation and fusion. Discharge Exam: Blood pressure 97/62, pulse 87, temperature 98.3 F (36.8 C), temperature source Oral, resp. rate 18, weight 87.1 kg (192 lb), SpO2 93 %. The patient is alert and pleasant. She looks well. She is in no apparent distress. Her dressing is clean and dry. Her strength is normal in her lower extremities.  Disposition: Home   Allergies as of 09/16/2017      Reactions   Levaquin [levofloxacin In D5w] Other (See Comments)   Muscle pain   Adhesive [tape] Other (See Comments)   Pulls skin, anything that does not pull is recommended    Doxycycline Nausea And Vomiting   Ivp Dye [iodinated Diagnostic Agents] Nausea And Vomiting, Other (See Comments)    Eyes turned blood red      Medication List    STOP taking these medications   HYDROcodone-acetaminophen 5-325 MG tablet Commonly known as:  NORCO/VICODIN   meloxicam 15 MG tablet Commonly known as:  MOBIC     TAKE these medications   ALPRAZolam 1 MG tablet Commonly known as:  XANAX TAKE (1) TABLET TWICE A  DAY. What changed:  See the new instructions.   docusate sodium 100 MG capsule Commonly known as:  COLACE Take 1 capsule (100 mg total) by mouth 2 (two) times daily.   FLUoxetine 40 MG capsule Commonly known as:  PROZAC Take 1 capsule (40 mg total) by mouth daily.   fluticasone 50 MCG/ACT nasal spray Commonly known as:  FLONASE Place 1 spray into both nostrils daily as needed for allergies. Reported on 11/25/2015   gabapentin 100 MG capsule Commonly known as:  NEURONTIN Take 200 mg by mouth at bedtime.   hydrochlorothiazide 12.5 MG capsule Commonly known as:  MICROZIDE TAKE ONE CAPSULE DAILY. What changed:  See the new instructions.   hydrocortisone 25 MG suppository Commonly known as:  ANUSOL-HC Place 1 suppository (25 mg total) rectally 2 (two) times daily.   loratadine 10 MG tablet Commonly known as:  CLARITIN Take 10 mg by mouth daily as needed for allergies.   OPCON-A OP Place 2 drops into both eyes as needed (for dry eyes).   oxyCODONE 5 MG immediate release tablet Commonly known as:  Oxy IR/ROXICODONE Take 1-2 tablets (5-10 mg total) by mouth every 3 (three) hours as needed for breakthrough pain.   pantoprazole 40 MG tablet Commonly known as:  PROTONIX Take 1 tablet (40 mg total) by mouth daily.   potassium chloride 10 MEQ tablet Commonly known as:  K-DUR Take 1 tablet (10 mEq total) by mouth daily.   pravastatin 10 MG tablet Commonly known as:  PRAVACHOL Take 1 tablet (10 mg total) by mouth daily.   promethazine 12.5 MG tablet Commonly known as:  PHENERGAN Take 1  tablet (12.5 mg total) by mouth 2 (two) times daily as needed for nausea or vomiting.   Pseudoephedrine HCl 240 MG Tb24 Take 240 mg by mouth daily as needed (for congestion). Reported on 05/18/2016   tiZANidine 4 MG tablet Commonly known as:  ZANAFLEX Take 2-4 mg by mouth 3 times daily as needed for muscle spasms        Signed: Ophelia Charter 09/16/2017, 7:36 AM

## 2017-09-16 NOTE — Progress Notes (Signed)
Physical Therapy Treatment Patient Details Name: Angel Riddle MRN: [id number] DOB: Nov 19, 1956 Today's Date: 09/16/2017    History of Present Illness Pt is a 61 y/o female s/p L3-5 PLIF. PMH includes anxiety, cancer, depression, heart murmur, HTN, and R foot surgery.     PT Comments    Pt progressing towards physical therapy goals. Was able to perform transfers and ambulation with gross min guard to supervision for safety. Pt and family were educated on general precautions, brace application and wearing schedule, car transfer, walking program and safe activity progression. Will continue to follow and progress as able per POC.   Follow Up Recommendations  No PT follow up     Equipment Recommendations  3in1 (PT)    Recommendations for Other Services       Precautions / Restrictions Precautions Precautions: Back Precaution Booklet Issued: Yes (comment) Precaution Comments: Pt was cued for precautions during functional mobility.  Required Braces or Orthoses: Spinal Brace Spinal Brace: Lumbar corset;Applied in sitting position Restrictions Weight Bearing Restrictions: No    Mobility  Bed Mobility Overal bed mobility: Needs Assistance Bed Mobility: Rolling;Sidelying to Sit;Sit to Sidelying Rolling: Modified independent (Device/Increase time) Sidelying to sit: Supervision     Sit to sidelying: Supervision General bed mobility comments: HOB flat and rails lowered to simulate home environment. VC's for proper log roll technique.   Transfers Overall transfer level: Needs assistance Equipment used: 1 person hand held assist Transfers: Sit to/from Stand Sit to Stand: Supervision         General transfer comment: VC's for hand placement on seated surface for safety. Pt did not require assist to power-up to full standing position.   Ambulation/Gait Ambulation/Gait assistance: Supervision;Min guard Ambulation Distance (Feet): 250 Feet Assistive device: Rolling walker (2  wheeled) Gait Pattern/deviations: Step-through pattern;Decreased stride length;Trunk flexed Gait velocity: Decreased Gait velocity interpretation: Below normal speed for age/gender General Gait Details: VC's for improved posture and general safety with the RW. Initially, hands-on guarding provided for safety, however pt progressed to supervision level by end of gait training.   Stairs Stairs: Yes   Stair Management: One rail Right;Step to pattern;Forwards;No rails Number of Stairs: 4 General stair comments: Initially pt practiced with railing on R and HHA on L. On second attempt, pt completed stair training with HHA only to simulate home environment. Pt's family was present for educated. Pt was cued for sequencing throughout stair training.   Wheelchair Mobility    Modified Rankin (Stroke Patients Only)       Balance Overall balance assessment: Needs assistance Sitting-balance support: No upper extremity supported;Feet supported Sitting balance-Leahy Scale: Good     Standing balance support: Bilateral upper extremity supported;Single extremity supported;During functional activity Standing balance-Leahy Scale: Poor Standing balance comment: Reliant on UE support for balance.                             Cognition Arousal/Alertness: Awake/alert Behavior During Therapy: WFL for tasks assessed/performed Overall Cognitive Status: Within Functional Limits for tasks assessed                                 General Comments: Pt reports she still feels "loopy" from her pain medications. Noted decreased attention at times and need for increased cues - however, grossly [facility]      Exercises      General Comments  Pertinent Vitals/Pain Pain Assessment: Faces Faces Pain Scale: Hurts little more Pain Location: back  Pain Descriptors / Indicators: Aching;Operative site guarding Pain Intervention(s): Limited activity within patient's tolerance;Monitored  during session;Repositioned    Home Living Family/patient expects to be discharged to:: Private residence Living Arrangements: Spouse/significant other Available Help at Discharge: Family;Available 24 hours/day Type of Home: House Home Access: Stairs to enter Entrance Stairs-Rails: Right Home Layout: Two level;Able to live on main level with bedroom/bathroom Home Equipment: Kasandra Knudsen - single point;Shower seat - built in      Prior Function Level of Independence: Independent          PT Goals (current goals can now be found in the care plan section) Acute Rehab PT Goals Patient Stated Goal: to decrease pain  PT Goal Formulation: With patient Time For Goal Achievement: 09/22/17 Potential to Achieve Goals: Good Progress towards PT goals: Progressing toward goals    Frequency    Min 5X/week      PT Plan Current plan remains appropriate    Co-evaluation              AM-PAC PT "6 Clicks" Daily Activity  Outcome Measure  Difficulty turning over in bed (including adjusting bedclothes, sheets and blankets)?: A Little Difficulty moving from lying on back to sitting on the side of the bed? : Unable Difficulty sitting down on and standing up from a chair with arms (e.g., wheelchair, bedside commode, etc,.)?: Unable Help needed moving to and from a bed to chair (including a wheelchair)?: A Little Help needed walking in hospital room?: A Little Help needed climbing 3-5 steps with a railing? : A Lot 6 Click Score: 13    End of Session Equipment Utilized During Treatment: Gait belt;Back brace Activity Tolerance: Patient tolerated treatment well Patient left: in bed;with call bell/phone within reach;with family/visitor present Nurse Communication: Mobility status PT Visit Diagnosis: Other abnormalities of gait and mobility (R26.89);Pain Pain - part of body:  (back)     Time: 5830-9407 PT Time Calculation (min) (ACUTE ONLY): 26 min  Charges:  $Gait Training: 23-37  mins                    G Codes:       Rolinda Roan, PT, DPT Acute Rehabilitation Services Pager: [phone]    Thelma Comp 09/16/2017, 11:56 AM

## 2017-09-16 NOTE — Progress Notes (Signed)
Patient is discharged from room 3C11 at this time. Alert and in stable condition. IV site d/c'd and instructions read to patient and family with understanding verbalized. Left unit via wheelchair with all belongings at side. 

## 2017-09-18 ENCOUNTER — Encounter (HOSPITAL_COMMUNITY): Payer: Self-pay | Admitting: Oncology

## 2017-09-18 ENCOUNTER — Emergency Department (HOSPITAL_COMMUNITY)
Admission: EM | Admit: 2017-09-18 | Discharge: 2017-09-18 | Disposition: A | Discharge status: Home / Self Care | Payer: BLUE CROSS/BLUE SHIELD | Attending: Emergency Medicine | Admitting: Emergency Medicine

## 2017-09-18 DIAGNOSIS — I10 Essential (primary) hypertension: Secondary | ICD-10-CM | POA: Diagnosis not present

## 2017-09-18 DIAGNOSIS — Z85828 Personal history of other malignant neoplasm of skin: Secondary | ICD-10-CM | POA: Insufficient documentation

## 2017-09-18 DIAGNOSIS — Z87891 Personal history of nicotine dependence: Secondary | ICD-10-CM | POA: Insufficient documentation

## 2017-09-18 DIAGNOSIS — Z79899 Other long term (current) drug therapy: Secondary | ICD-10-CM | POA: Diagnosis not present

## 2017-09-18 DIAGNOSIS — M549 Dorsalgia, unspecified: Secondary | ICD-10-CM | POA: Diagnosis not present

## 2017-09-18 DIAGNOSIS — G8918 Other acute postprocedural pain: Secondary | ICD-10-CM | POA: Insufficient documentation

## 2017-09-18 LAB — COMPREHENSIVE METABOLIC PANEL
ALBUMIN: 3.2 g/dL — AB (ref 3.5–5.0)
ALT: 17 U/L (ref 14–54)
AST: 44 U/L — AB (ref 15–41)
Alkaline Phosphatase: 93 U/L (ref 38–126)
Anion gap: 9 (ref 5–15)
BUN: 11 mg/dL (ref 6–20)
CHLORIDE: 100 mmol/L — AB (ref 101–111)
CO2: 25 mmol/L (ref 22–32)
CREATININE: 0.81 mg/dL (ref 0.44–1.00)
Calcium: 8.4 mg/dL — ABNORMAL LOW (ref 8.9–10.3)
GFR calc Af Amer: >60 mL/min (ref >60)
GLUCOSE: 101 mg/dL — AB (ref 65–99)
POTASSIUM: 3.1 mmol/L — AB (ref 3.5–5.1)
Sodium: 134 mmol/L — ABNORMAL LOW (ref 135–145)
Total Bilirubin: 1.1 mg/dL (ref 0.3–1.2)
Total Protein: 6.2 g/dL — ABNORMAL LOW (ref 6.5–8.1)

## 2017-09-18 LAB — URINALYSIS, ROUTINE W REFLEX MICROSCOPIC
Bilirubin Urine: NEGATIVE
GLUCOSE, UA: NEGATIVE mg/dL
HGB URINE DIPSTICK: NEGATIVE
KETONES UR: NEGATIVE mg/dL
Leukocytes, UA: NEGATIVE
Nitrite: NEGATIVE
PH: 8 (ref 5.0–8.0)
PROTEIN: NEGATIVE mg/dL
Specific Gravity, Urine: 1.005 (ref 1.005–1.030)

## 2017-09-18 LAB — CBC
HEMATOCRIT: 30.8 % — AB (ref 36.0–46.0)
Hemoglobin: 10.2 g/dL — ABNORMAL LOW (ref 12.0–15.0)
MCH: 29.3 pg (ref 26.0–34.0)
MCHC: 33.1 g/dL (ref 30.0–36.0)
MCV: 88.5 fL (ref 78.0–100.0)
PLATELETS: 241 10*3/uL (ref 150–400)
RBC: 3.48 MIL/uL — ABNORMAL LOW (ref 3.87–5.11)
RDW: 13.5 % (ref 11.5–15.5)
WBC: 11 10*3/uL — ABNORMAL HIGH (ref 4.0–10.5)

## 2017-09-18 LAB — LIPASE, BLOOD: Lipase: 16 U/L (ref 11–51)

## 2017-09-18 MED ORDER — HYDROCODONE-ACETAMINOPHEN 5-325 MG PO TABS
2.0000 | ORAL_TABLET | Freq: Once | ORAL | Status: AC
  Administered 2017-09-18: 2 via ORAL
  Filled 2017-09-18: qty 2

## 2017-09-18 MED ORDER — LORAZEPAM 2 MG/ML IJ SOLN
1.0000 mg | Freq: Once | INTRAMUSCULAR | Status: AC
  Administered 2017-09-18: 1 mg via INTRAVENOUS
  Filled 2017-09-18: qty 1

## 2017-09-18 MED ORDER — HYDROMORPHONE HCL 1 MG/ML IJ SOLN
1.0000 mg | Freq: Once | INTRAMUSCULAR | Status: AC
  Administered 2017-09-18: 1 mg via INTRAVENOUS
  Filled 2017-09-18: qty 1

## 2017-09-18 MED ORDER — SODIUM CHLORIDE 0.9 % IV BOLUS (SEPSIS)
1000.0000 mL | Freq: Once | INTRAVENOUS | Status: AC
  Administered 2017-09-18: 1000 mL via INTRAVENOUS

## 2017-09-18 MED ORDER — ONDANSETRON HCL 4 MG/2ML IJ SOLN
4.0000 mg | Freq: Once | INTRAMUSCULAR | Status: AC
  Administered 2017-09-18: 4 mg via INTRAVENOUS
  Filled 2017-09-18: qty 2

## 2017-09-18 NOTE — ED Notes (Signed)
Ambulated pt in hallway to restroom located beside room, tolerated well. Pt stated on the way back to room she felt really dizzy. Pt placed back in bed.

## 2017-09-18 NOTE — ED Triage Notes (Signed)
Pt had lumbar fusion on Thursday, was released Friday.  Pt developed diarrhea on Saturday.  States that she has had four episodes in the last 24 hours.  Per EMS pt was took one of her norco prior to transport.  Pt rates her back pain 10/10.

## 2017-09-18 NOTE — ED Notes (Signed)
Pt able to roll without difficulty, does c/o pain, bandage intact on lower back-- pt had back surgery on Thursday, discharged on friday

## 2017-09-18 NOTE — ED Provider Notes (Signed)
[facility] DEPT Provider Note   CSN: [id number] Arrival date & time: 09/18/17  4098     History   Chief Complaint Chief Complaint  Patient presents with  . Diarrhea    HPI Angel Riddle is a 61 y.o. female.  Pt had a L3 and L4 decompression on 10/11 by Dr. Arnoldo Morale.  She was released on the 12th.  The pt presents to the ED today because of severe back pain.  Pt said she did not take her pain meds for 12 hours b/c she was asleep.  She took her pain meds pta.  The pt did have 4 episodes of diarrhea yesterday and today.  She denies f/c.  No abd pain or n/v.         Past Medical History:  Diagnosis Date  . Allergy   . Anxiety   . Arthritis   . Cancer (HCC)    basal cell nose  . Chronic back pain    degenerative disc disease  . Degenerative disc disease, lumbar   . Depression   . Dyspnea    with exertion / pain  . Gallstones   . GERD (gastroesophageal reflux disease)   . Heart murmur    followed by PCP  . Hx of colonic polyp   . Hx: UTI (urinary tract infection)   . Hypercholesterolemia   . Hypertension   . Panic attacks    H/O  . Wears dentures    partial upper and lower    Patient Active Problem List   Diagnosis Date Noted  . Lumbar stenosis with neurogenic claudication 09/15/2017  . Obesity (BMI 30.0-34.9) 04/25/2017  . Hyperglycemia 04/25/2017  . Personal history of tobacco use, presenting hazards to health 12/15/2016  . Nausea without vomiting 03/29/2016  . Personal history of colonic polyps   . Anal fissure   . Benign neoplasm of sigmoid colon   . Left arm pain 08/03/2015  . Cough 07/30/2015  . Urinary hesitancy 07/30/2015  . Health care maintenance 01/12/2015  . Rectal irritation 11/24/2014  . Gallstones 04/01/2014  . Abdominal fullness 03/10/2014  . Numbness and tingling 10/02/2013  . Hypertension 10/02/2013  . Chronic back pain 07/18/2013  . Depression 07/18/2013  . Anxiety 07/18/2013  . Panic attacks 07/18/2013  .  Environmental allergies 07/18/2013  . Hypercholesterolemia 07/18/2013  . History of colonic polyps 07/18/2013    Past Surgical History:  Procedure Laterality Date  . ABDOMINAL HYSTERECTOMY  1992   partial  . BACK SURGERY     lumbar fusion  . CATARACT EXTRACTION Bilateral 2010,2011  . CHOLECYSTECTOMY  04-19-14  . COLONOSCOPY  07/02/11   DR. Byrnett  . COLONOSCOPY WITH PROPOFOL N/A 09/25/2015   Procedure: COLONOSCOPY WITH PROPOFOL;  Surgeon: Lucilla Lame, MD;  Location: [facility];  Service: Endoscopy;  Laterality: N/A;  . FOOT SURGERY Right 2009  . MOUTH SURGERY    . POLYPECTOMY  09/25/2015   Procedure: POLYPECTOMY;  Surgeon: Lucilla Lame, MD;  Location: [facility];  Service: Endoscopy;;  . skin surgery Left 08/27/13   Basal cell removal-left nostril  . TONSILLECTOMY AND ADENOIDECTOMY  1969    OB History    Gravida Para Term Preterm AB Living   3 2     1 1    SAB TAB Ectopic Multiple Live Births   1              Obstetric Comments   1st Menstrual Cycle:  12 1st Pregnancy:  21  Home Medications    Prior to Admission medications   Medication Sig Start Date End Date Taking? Authorizing Provider  ALPRAZolam (XANAX) 1 MG tablet TAKE (1) TABLET TWICE A DAY. Patient taking differently: Take 1 mg by mouth at bedtime. Take 1 mg by mouth daily as needed for anxiety 08/17/17   Einar Pheasant, MD  docusate sodium (COLACE) 100 MG capsule Take 1 capsule (100 mg total) by mouth 2 (two) times daily. 09/16/17   Newman Pies, MD  FLUoxetine (PROZAC) 40 MG capsule Take 1 capsule (40 mg total) by mouth daily. 09/12/17   Einar Pheasant, MD  fluticasone (FLONASE) 50 MCG/ACT nasal spray Place 1 spray into both nostrils daily as needed for allergies. Reported on 11/25/2015 12/24/13   [provider]  gabapentin (NEURONTIN) 100 MG capsule Take 200 mg by mouth at bedtime.  02/09/17   [provider]  hydrochlorothiazide (MICROZIDE) 12.5 MG capsule TAKE ONE  CAPSULE DAILY. Patient taking differently: Take 12.5 mg by mouth once daily 06/13/17   Einar Pheasant, MD  hydrocortisone (ANUSOL-HC) 25 MG suppository Place 1 suppository (25 mg total) rectally 2 (two) times daily. Patient not taking: Reported on 09/05/2017 11/28/15   Einar Pheasant, MD  loratadine (CLARITIN) 10 MG tablet Take 10 mg by mouth daily as needed for allergies.    [provider]  Naphazoline-Pheniramine (OPCON-A OP) Place 2 drops into both eyes as needed (for dry eyes).    [provider]  oxyCODONE (OXY IR/ROXICODONE) 5 MG immediate release tablet Take 1-2 tablets (5-10 mg total) by mouth every 3 (three) hours as needed for breakthrough pain. 09/16/17   Newman Pies, MD  pantoprazole (PROTONIX) 40 MG tablet Take 1 tablet (40 mg total) by mouth daily. 08/15/17   Einar Pheasant, MD  potassium chloride (K-DUR) 10 MEQ tablet Take 1 tablet (10 mEq total) by mouth daily. 07/26/17   Einar Pheasant, MD  pravastatin (PRAVACHOL) 10 MG tablet Take 1 tablet (10 mg total) by mouth daily. 08/31/17   Einar Pheasant, MD  promethazine (PHENERGAN) 12.5 MG tablet Take 1 tablet (12.5 mg total) by mouth 2 (two) times daily as needed for nausea or vomiting. 07/20/17   Einar Pheasant, MD  Pseudoephedrine HCl 240 MG TB24 Take 240 mg by mouth daily as needed (for congestion). Reported on 05/18/2016    [provider]  tiZANidine (ZANAFLEX) 4 MG tablet Take 2-4 mg by mouth 3 times daily as needed for muscle spasms 02/08/17   [provider]    Family History Family History  Problem Relation Age of Onset  . Hyperlipidemia Mother   . Hypertension Mother   . Alcohol abuse Father   . Hyperlipidemia Father   . Heart disease Father   . Diabetes Father   . Hyperlipidemia Sister   . Lung cancer Brother   . Hyperlipidemia Brother     Social History Social History  Substance Use Topics  . Smoking status: Former Smoker    Packs/day: 1.00    Years: 37.00    Quit date:  2012  . Smokeless tobacco: Never Used  . Alcohol use 0.6 oz/week    1 Glasses of wine per week     Allergies   Levaquin [levofloxacin in d5w]; Adhesive [tape]; Doxycycline; and Ivp dye [iodinated diagnostic agents]   Review of Systems Review of Systems  Gastrointestinal: Positive for diarrhea.  Musculoskeletal: Positive for back pain.  All other systems reviewed and are negative.    Physical Exam Updated Vital Signs BP (!) 107/59  Pulse (!) 114   Temp 99.2 F (37.3 C) (Oral)   Resp 20   Ht 5\' 5"  (1.651 m)   Wt 87.1 kg (192 lb)   SpO2 96%   BMI 31.95 kg/m   Physical Exam  Constitutional: She is oriented to person, place, and time. She appears well-developed and well-nourished.  HENT:  Head: Normocephalic and atraumatic.  Right Ear: External ear normal.  Left Ear: External ear normal.  Nose: Nose normal.  Mouth/Throat: Oropharynx is clear and moist.  Eyes: Pupils are equal, round, and reactive to light. Conjunctivae and EOM are normal.  Neck: Normal range of motion. Neck supple.  Cardiovascular: Normal rate, regular rhythm, normal heart sounds and intact distal pulses.   Pulmonary/Chest: Effort normal and breath sounds normal.  Abdominal: Soft. Bowel sounds are normal.  Musculoskeletal: Normal range of motion.  Neurological: She is alert and oriented to person, place, and time.  Skin: Skin is warm. Capillary refill takes less than 2 seconds.  Psychiatric: She has a normal mood and affect. Her behavior is normal. Judgment and thought content normal.  Nursing note and vitals reviewed.    ED Treatments / Results  Labs (all labs ordered are listed, but only abnormal results are displayed) Labs Reviewed  COMPREHENSIVE METABOLIC PANEL - Abnormal; Notable for the following:       Result Value   Sodium 134 (*)    Potassium 3.1 (*)    Chloride 100 (*)    Glucose, Bld 101 (*)    Calcium 8.4 (*)    Total Protein 6.2 (*)    Albumin 3.2 (*)    AST 44 (*)    All  other components within normal limits  CBC - Abnormal; Notable for the following:    WBC 11.0 (*)    RBC 3.48 (*)    Hemoglobin 10.2 (*)    HCT 30.8 (*)    All other components within normal limits  URINALYSIS, ROUTINE W REFLEX MICROSCOPIC - Abnormal; Notable for the following:    Color, Urine STRAW (*)    All other components within normal limits  LIPASE, BLOOD    EKG  EKG Interpretation None       Radiology No results found.  Procedures Procedures (including critical care time)  Medications Ordered in ED Medications  ondansetron (ZOFRAN) injection 4 mg (4 mg Intravenous Given 09/18/17 0831)  HYDROmorphone (DILAUDID) injection 1 mg (1 mg Intravenous Given 09/18/17 0831)  LORazepam (ATIVAN) injection 1 mg (1 mg Intravenous Given 09/18/17 0831)  HYDROmorphone (DILAUDID) injection 1 mg (1 mg Intravenous Given 09/18/17 0958)  sodium chloride 0.9 % bolus 1,000 mL (1,000 mLs Intravenous New Bag/Given 09/18/17 1218)     Initial Impression / Assessment and Plan / ED Course  I have reviewed the triage vital signs and the nursing notes.  Pertinent labs & imaging results that were available during my care of the patient were reviewed by me and considered in my medical decision making (see chart for details).  Pain is much improved.  She is able to ambulate.  Pt felt a little dizzy after getting up, but after IVFs, she feels better.  Pt d/w provider on call for [place] [facility].  The pt is to take her pain meds as directed and f/u with Dr. Arnoldo Morale.  The pt is ok with this plan.  Final Clinical Impressions(s) / ED Diagnoses   Final diagnoses:  Post-op pain    New Prescriptions New Prescriptions   No medications on file  Isla Pence, MD 09/18/17 1240

## 2017-09-18 NOTE — ED Notes (Signed)
Icechips given to pt per Lorie Phenix, RN.

## 2017-09-18 NOTE — ED Notes (Signed)
Pt assisted onto bedpan, had large yellow soft semi liquid stool,

## 2017-09-18 NOTE — ED Notes (Signed)
Purewick placed on pt. Procedure explained, pt tolerating well.

## 2017-10-01 ENCOUNTER — Other Ambulatory Visit: Payer: Self-pay | Admitting: Internal Medicine

## 2017-10-03 ENCOUNTER — Other Ambulatory Visit: Payer: Self-pay

## 2017-10-03 MED ORDER — PRAVASTATIN SODIUM 10 MG PO TABS: 10.0000 mg | ORAL_TABLET | Freq: Every day | ORAL | 3 refills | Status: DC

## 2017-10-19 ENCOUNTER — Other Ambulatory Visit: Payer: Self-pay | Admitting: Internal Medicine

## 2017-10-20 ENCOUNTER — Other Ambulatory Visit: Payer: BLUE CROSS/BLUE SHIELD

## 2017-10-24 ENCOUNTER — Ambulatory Visit: Payer: BLUE CROSS/BLUE SHIELD | Admitting: Internal Medicine

## 2017-10-31 ENCOUNTER — Other Ambulatory Visit: Payer: Self-pay | Admitting: Internal Medicine

## 2017-10-31 DIAGNOSIS — R739 Hyperglycemia, unspecified: Secondary | ICD-10-CM

## 2017-10-31 DIAGNOSIS — I10 Essential (primary) hypertension: Secondary | ICD-10-CM

## 2017-10-31 DIAGNOSIS — D649 Anemia, unspecified: Secondary | ICD-10-CM

## 2017-10-31 DIAGNOSIS — E7800 Pure hypercholesterolemia, unspecified: Secondary | ICD-10-CM

## 2017-10-31 NOTE — Telephone Encounter (Signed)
She was given 30 tablets in August, so apparently not taking regularly.  Please document how pt is taking.  Needs fasting lab appt.  Will check a1c, lipid panel and metabolic panel.  (this will check potassium level to see if still needed).  I will place order for labs.

## 2017-10-31 NOTE — Telephone Encounter (Signed)
Okay to refill Potassium? PT was given a 30 day supply with no refills on 07/26/17.  LOV: 07/20/17 NOV: 01/03/18

## 2017-12-02 ENCOUNTER — Other Ambulatory Visit: Payer: Self-pay | Admitting: Internal Medicine

## 2017-12-15 ENCOUNTER — Telehealth: Payer: Self-pay | Admitting: *Deleted

## 2017-12-15 DIAGNOSIS — Z122 Encounter for screening for malignant neoplasm of respiratory organs: Secondary | ICD-10-CM

## 2017-12-15 DIAGNOSIS — Z87891 Personal history of nicotine dependence: Secondary | ICD-10-CM

## 2017-12-15 NOTE — Telephone Encounter (Signed)
Left message for patient to notify them that it is time to schedule annual low dose lung cancer screening CT scan. Instructed patient to call back to verify information prior to the scan being scheduled.  

## 2017-12-15 NOTE — Telephone Encounter (Signed)
Notified patient that annual lung cancer screening low dose CT scan is due currently or will be in near future. Confirmed that patient is within the age range of 55-77, and asymptomatic, (no signs or symptoms of lung cancer). Patient denies illness that would prevent curative treatment for lung cancer if found. Verified smoking history, (former, quit 2012, 37 pack year). The shared decision making visit was done 12/14/16. Patient is agreeable for CT scan being scheduled.

## 2017-12-17 ENCOUNTER — Other Ambulatory Visit: Payer: Self-pay | Admitting: Internal Medicine

## 2017-12-19 NOTE — Telephone Encounter (Signed)
rx ok'd for xanax #60 with one refill.

## 2017-12-19 NOTE — Telephone Encounter (Signed)
Okay to refill Xanax? Last written on: 10/25/17 #60 & no refills.  LOV: 07/20/17 NOV: 01/03/18

## 2017-12-26 ENCOUNTER — Ambulatory Visit
Admission: RE | Admit: 2017-12-26 | Discharge: 2017-12-26 | Disposition: A | Discharge status: Home / Self Care | Payer: BLUE CROSS/BLUE SHIELD | Source: Ambulatory Visit | Attending: Nurse Practitioner | Admitting: Nurse Practitioner

## 2017-12-26 DIAGNOSIS — I700 Atherosclerosis of aorta: Secondary | ICD-10-CM | POA: Diagnosis not present

## 2017-12-26 DIAGNOSIS — Z122 Encounter for screening for malignant neoplasm of respiratory organs: Secondary | ICD-10-CM | POA: Diagnosis not present

## 2017-12-26 DIAGNOSIS — Z87891 Personal history of nicotine dependence: Secondary | ICD-10-CM | POA: Insufficient documentation

## 2017-12-27 ENCOUNTER — Other Ambulatory Visit (INDEPENDENT_AMBULATORY_CARE_PROVIDER_SITE_OTHER): Payer: BLUE CROSS/BLUE SHIELD

## 2017-12-27 DIAGNOSIS — E7800 Pure hypercholesterolemia, unspecified: Secondary | ICD-10-CM

## 2017-12-27 DIAGNOSIS — D649 Anemia, unspecified: Secondary | ICD-10-CM | POA: Diagnosis not present

## 2017-12-27 DIAGNOSIS — I10 Essential (primary) hypertension: Secondary | ICD-10-CM | POA: Diagnosis not present

## 2017-12-27 DIAGNOSIS — R739 Hyperglycemia, unspecified: Secondary | ICD-10-CM

## 2017-12-27 LAB — BASIC METABOLIC PANEL
BUN: 11 mg/dL (ref 6–23)
CHLORIDE: 101 meq/L (ref 96–112)
CO2: 30 meq/L (ref 19–32)
CREATININE: 0.83 mg/dL (ref 0.40–1.20)
Calcium: 9.5 mg/dL (ref 8.4–10.5)
GFR: 74.06 mL/min (ref >60.00)
Glucose, Bld: 96 mg/dL (ref 70–99)
POTASSIUM: 3.8 meq/L (ref 3.5–5.1)
Sodium: 139 mEq/L (ref 135–145)

## 2017-12-27 LAB — CBC WITH DIFFERENTIAL/PLATELET
BASOS PCT: 0.4 % (ref 0.0–3.0)
Basophils Absolute: 0 10*3/uL (ref 0.0–0.1)
EOS ABS: 0.1 10*3/uL (ref 0.0–0.7)
EOS PCT: 1.3 % (ref 0.0–5.0)
HEMATOCRIT: 39.1 % (ref 36.0–46.0)
Hemoglobin: 13.1 g/dL (ref 12.0–15.0)
LYMPHS PCT: 26 % (ref 12.0–46.0)
Lymphs Abs: 1.9 10*3/uL (ref 0.7–4.0)
MCHC: 33.5 g/dL (ref 30.0–36.0)
MCV: 85.9 fl (ref 78.0–100.0)
MONO ABS: 0.5 10*3/uL (ref 0.1–1.0)
Monocytes Relative: 7.4 % (ref 3.0–12.0)
Neutro Abs: 4.8 10*3/uL (ref 1.4–7.7)
Neutrophils Relative %: 64.9 % (ref 43.0–77.0)
Platelets: 332 10*3/uL (ref 150.0–400.0)
RBC: 4.56 Mil/uL (ref 3.87–5.11)
RDW: 14.1 % (ref 11.5–15.5)
WBC: 7.3 10*3/uL (ref 4.0–10.5)

## 2017-12-27 LAB — IBC PANEL
IRON: 61 ug/dL (ref 42–145)
Saturation Ratios: 13.5 % — ABNORMAL LOW (ref 20.0–50.0)
Transferrin: 323 mg/dL (ref 212.0–360.0)

## 2017-12-27 LAB — LDL CHOLESTEROL, DIRECT: LDL DIRECT: 219 mg/dL

## 2017-12-27 LAB — LIPID PANEL
CHOL/HDL RATIO: 6
Cholesterol: 308 mg/dL — ABNORMAL HIGH (ref 0–200)
HDL: 53.2 mg/dL (ref >39.00)
NonHDL: 254.79
Triglycerides: 216 mg/dL — ABNORMAL HIGH (ref 0.0–149.0)
VLDL: 43.2 mg/dL — ABNORMAL HIGH (ref 0.0–40.0)

## 2017-12-27 LAB — HEMOGLOBIN A1C: HEMOGLOBIN A1C: 6 % (ref 4.6–6.5)

## 2017-12-27 LAB — HEPATIC FUNCTION PANEL
ALT: 11 U/L (ref 0–35)
AST: 22 U/L (ref 0–37)
Albumin: 4.1 g/dL (ref 3.5–5.2)
Alkaline Phosphatase: 111 U/L (ref 39–117)
BILIRUBIN DIRECT: 0.1 mg/dL (ref 0.0–0.3)
BILIRUBIN TOTAL: 0.7 mg/dL (ref 0.2–1.2)
TOTAL PROTEIN: 7 g/dL (ref 6.0–8.3)

## 2017-12-27 LAB — TSH: TSH: 0.81 u[IU]/mL (ref 0.35–4.50)

## 2017-12-27 LAB — FERRITIN: Ferritin: 41.8 ng/mL (ref 10.0–291.0)

## 2017-12-27 LAB — VITAMIN B12: Vitamin B-12: 224 pg/mL (ref 211–911)

## 2018-01-02 ENCOUNTER — Other Ambulatory Visit: Payer: Self-pay | Admitting: Internal Medicine

## 2018-01-02 ENCOUNTER — Encounter: Payer: Self-pay | Admitting: *Deleted

## 2018-01-02 NOTE — Telephone Encounter (Signed)
Hold for appt tomorrow

## 2018-01-02 NOTE — Telephone Encounter (Signed)
rx ok'd for potassium.  Will discuss meloxicam refill at appt.

## 2018-01-03 ENCOUNTER — Ambulatory Visit (INDEPENDENT_AMBULATORY_CARE_PROVIDER_SITE_OTHER): Payer: BLUE CROSS/BLUE SHIELD | Admitting: Internal Medicine

## 2018-01-03 ENCOUNTER — Encounter: Payer: Self-pay | Admitting: Internal Medicine

## 2018-01-03 ENCOUNTER — Other Ambulatory Visit: Payer: Self-pay | Admitting: Internal Medicine

## 2018-01-03 VITALS — BP 118/76 | HR 96 | Temp 98.7°F | Resp 15 | Ht 65.0 in | Wt 199.0 lb

## 2018-01-03 DIAGNOSIS — F329 Major depressive disorder, single episode, unspecified: Secondary | ICD-10-CM | POA: Diagnosis not present

## 2018-01-03 DIAGNOSIS — E538 Deficiency of other specified B group vitamins: Secondary | ICD-10-CM

## 2018-01-03 DIAGNOSIS — M549 Dorsalgia, unspecified: Secondary | ICD-10-CM | POA: Diagnosis not present

## 2018-01-03 DIAGNOSIS — G8929 Other chronic pain: Secondary | ICD-10-CM | POA: Diagnosis not present

## 2018-01-03 DIAGNOSIS — F419 Anxiety disorder, unspecified: Secondary | ICD-10-CM

## 2018-01-03 DIAGNOSIS — Z6833 Body mass index (BMI) 33.0-33.9, adult: Secondary | ICD-10-CM | POA: Diagnosis not present

## 2018-01-03 DIAGNOSIS — R079 Chest pain, unspecified: Secondary | ICD-10-CM | POA: Diagnosis not present

## 2018-01-03 DIAGNOSIS — I10 Essential (primary) hypertension: Secondary | ICD-10-CM | POA: Diagnosis not present

## 2018-01-03 DIAGNOSIS — E7800 Pure hypercholesterolemia, unspecified: Secondary | ICD-10-CM

## 2018-01-03 DIAGNOSIS — R110 Nausea: Secondary | ICD-10-CM

## 2018-01-03 DIAGNOSIS — R739 Hyperglycemia, unspecified: Secondary | ICD-10-CM | POA: Diagnosis not present

## 2018-01-03 DIAGNOSIS — F32A Depression, unspecified: Secondary | ICD-10-CM

## 2018-01-03 MED ORDER — CYANOCOBALAMIN 1000 MCG/ML IJ SOLN
1000.0000 ug | Freq: Once | INTRAMUSCULAR | Status: AC
  Administered 2018-01-03: 1000 ug via INTRAMUSCULAR

## 2018-01-03 MED ORDER — PRAVASTATIN SODIUM 20 MG PO TABS: 20.0000 mg | ORAL_TABLET | Freq: Every day | ORAL | 3 refills | Status: DC

## 2018-01-03 NOTE — Progress Notes (Signed)
Patient ID: Angel Riddle, female   DOB: Oct 02, 1956, 62 y.o.   MRN: [id number]   Subjective:    Patient ID: Angel Riddle, female    DOB: January 30, 1956, 62 y.o.   MRN: [id number]  HPI  Patient here for a scheduled follow up.  Has had h/o persistent back pain.  Takes hydrocodone 2/day.  Has f/u planned with surgery (Dr Arnoldo Morale) 01/2018.  Also has been having issues with her right shoulder.  S/p injection.  Last 5-6 weeks ago.  Helped.  Still with increased stress.  Discussed with her today.  Overall she feels she is handling things relatively well.  Plans to start walking more.  Discussed diet and exercise.  She has noticed some leg cramps.  Discussed stretching.  Stays hydrated.  She reports noticing an episodes of chest pain.  States felt anterior chest pain that radiated up to her neck.  Took aspirin.  Lasted approximately 10 minutes.  Has not noticed since.  Not very active.  Denies any chest pain with increased activity.  Breathing stable.  No increased acid reflux.  Discussed lab results.     Past Medical History:  Diagnosis Date  . Allergy   . Anxiety   . Arthritis   . Cancer (HCC)    basal cell nose  . Chronic back pain    degenerative disc disease  . Degenerative disc disease, lumbar   . Depression   . Dyspnea    with exertion / pain  . Gallstones   . GERD (gastroesophageal reflux disease)   . Heart murmur    followed by PCP  . Hx of colonic polyp   . Hx: UTI (urinary tract infection)   . Hypercholesterolemia   . Hypertension   . Panic attacks    H/O  . Wears dentures    partial upper and lower   Past Surgical History:  Procedure Laterality Date  . ABDOMINAL HYSTERECTOMY  1992   partial  . BACK SURGERY     lumbar fusion  . CATARACT EXTRACTION Bilateral 2010,2011  . CHOLECYSTECTOMY  04-19-14  . COLONOSCOPY  07/02/11   DR. Byrnett  . COLONOSCOPY WITH PROPOFOL N/A 09/25/2015   Procedure: COLONOSCOPY WITH PROPOFOL;  Surgeon: Lucilla Lame, MD;  Location: [facility];  Service: Endoscopy;  Laterality: N/A;  . FOOT SURGERY Right 2009  . MOUTH SURGERY    . POLYPECTOMY  09/25/2015   Procedure: POLYPECTOMY;  Surgeon: Lucilla Lame, MD;  Location: [facility];  Service: Endoscopy;;  . skin surgery Left 08/27/13   Basal cell removal-left nostril  . TONSILLECTOMY AND ADENOIDECTOMY  1969   Family History  Problem Relation Age of Onset  . Hyperlipidemia Mother   . Hypertension Mother   . Alcohol abuse Father   . Hyperlipidemia Father   . Heart disease Father   . Diabetes Father   . Hyperlipidemia Sister   . Lung cancer Brother   . Hyperlipidemia Brother    Social History   Socioeconomic History  . Marital status: Married    Spouse name: None  . Number of children: 2  . Years of education: None  . Highest education level: None  Social Needs  . Financial resource strain: None  . Food insecurity - worry: None  . Food insecurity - inability: None  . Transportation needs - medical: None  . Transportation needs - non-medical: None  Occupational History  . None  Tobacco Use  . Smoking status: Former Smoker    Packs/day:  1.00    Years: 37.00    Pack years: 37.00    Last attempt to quit: 2012    Years since quitting: 7.0  . Smokeless tobacco: Never Used  Substance and Sexual Activity  . Alcohol use: Yes    Alcohol/week: 0.6 oz    Types: 1 Glasses of wine per week  . Drug use: No  . Sexual activity: None  Other Topics Concern  . None  Social History Narrative  . None    Outpatient Encounter Medications as of 01/03/2018  Medication Sig  . ALPRAZolam (XANAX) 1 MG tablet TAKE (1) TABLET TWICE A DAY.  Marland Kitchen FLUoxetine (PROZAC) 40 MG capsule Take 1 capsule (40 mg total) by mouth daily.  . fluticasone (FLONASE) 50 MCG/ACT nasal spray Place 1 spray into both nostrils daily as needed for allergies. Reported on 11/25/2015  . gabapentin (NEURONTIN) 100 MG capsule Take 200 mg by mouth at bedtime.   . hydrochlorothiazide (MICROZIDE) 12.5 MG  capsule TAKE ONE CAPSULE DAILY.  Marland Kitchen loratadine (CLARITIN) 10 MG tablet Take 10 mg by mouth daily as needed for allergies.  . meloxicam (MOBIC) 15 MG tablet Take 1 tablet (15 mg total) by mouth daily.  . pantoprazole (PROTONIX) 40 MG tablet Take 1 tablet (40 mg total) by mouth daily.  . potassium chloride (K-DUR) 10 MEQ tablet Take 1 tablet (10 mEq total) by mouth daily.  . promethazine (PHENERGAN) 12.5 MG tablet Take 1 tablet (12.5 mg total) by mouth 2 (two) times daily as needed for nausea or vomiting.  . Pseudoephedrine HCl 240 MG TB24 Take 240 mg by mouth daily as needed (for congestion). Reported on 05/18/2016  . tiZANidine (ZANAFLEX) 4 MG tablet Take 2-4 mg by mouth 3 times daily as needed for muscle spasms  . [DISCONTINUED] pravastatin (PRAVACHOL) 10 MG tablet Take 1 tablet (10 mg total) by mouth daily.  Marland Kitchen docusate sodium (COLACE) 100 MG capsule Take 1 capsule (100 mg total) by mouth 2 (two) times daily. (Patient not taking: Reported on 01/03/2018)  . HYDROcodone-acetaminophen (NORCO) 10-325 MG tablet   . hydrocortisone (ANUSOL-HC) 25 MG suppository Place 1 suppository (25 mg total) rectally 2 (two) times daily. (Patient not taking: Reported on 09/05/2017)  . Naphazoline-Pheniramine (OPCON-A OP) Place 2 drops into both eyes as needed (for dry eyes).  . pravastatin (PRAVACHOL) 20 MG tablet Take 1 tablet (20 mg total) by mouth daily.  . [DISCONTINUED] oxyCODONE (OXY IR/ROXICODONE) 5 MG immediate release tablet Take 1-2 tablets (5-10 mg total) by mouth every 3 (three) hours as needed for breakthrough pain. (Patient not taking: Reported on 01/03/2018)  . [EXPIRED] cyanocobalamin ((VITAMIN B-12)) injection 1,000 mcg   . [DISCONTINUED] cyanocobalamin ((VITAMIN B-12)) injection 1,000 mcg    No facility-administered encounter medications on file as of 01/03/2018.     Review of Systems  Constitutional: Negative for appetite change and unexpected weight change.  HENT: Negative for congestion and sinus  pressure.   Respiratory: Negative for cough and shortness of breath.   Cardiovascular: Positive for chest pain. Negative for palpitations and leg swelling.  Gastrointestinal: Negative for abdominal pain, diarrhea, nausea and vomiting.  Genitourinary: Negative for difficulty urinating and dysuria.  Musculoskeletal: Positive for back pain. Negative for joint swelling.  Skin: Negative for color change and rash.  Neurological: Negative for dizziness, light-headedness and headaches.  Psychiatric/Behavioral: Negative for agitation and dysphoric mood.       Objective:    Physical Exam  Constitutional: She appears well-developed and well-nourished. No distress.  HENT:  Nose: Nose normal.  Mouth/Throat: Oropharynx is clear and moist.  Neck: Neck supple. No thyromegaly present.  Cardiovascular: Normal rate and regular rhythm.  Pulmonary/Chest: Breath sounds normal. No respiratory distress. She has no wheezes.  Abdominal: Soft. Bowel sounds are normal. There is no tenderness.  Musculoskeletal: She exhibits no edema or tenderness.  Lymphadenopathy:    She has no cervical adenopathy.  Skin: No rash noted. No erythema.  Psychiatric: She has a normal mood and affect. Her behavior is normal.    BP 118/76 (BP Location: Left Arm, Patient Position: Sitting, Cuff Size: Large)   Pulse 96   Temp 98.7 F (37.1 C) (Oral)   Resp 15   Ht '5\' 5"'  (1.651 m)   Wt 199 lb (90.3 kg)   SpO2 96%   BMI 33.12 kg/m  Wt Readings from Last 3 Encounters:  01/03/18 199 lb (90.3 kg)  12/26/17 185 lb (83.9 kg)  09/18/17 192 lb (87.1 kg)     Lab Results  Component Value Date   WBC 7.3 12/27/2017   HGB 13.1 12/27/2017   HCT 39.1 12/27/2017   PLT 332.0 12/27/2017   GLUCOSE 96 12/27/2017   CHOL 308 (H) 12/27/2017   TRIG 216.0 (H) 12/27/2017   HDL 53.20 12/27/2017   LDLDIRECT 219.0 12/27/2017   LDLCALC 218 (H) 03/14/2014   ALT 11 12/27/2017   AST 22 12/27/2017   NA 139 12/27/2017   K 3.8 12/27/2017   CL  101 12/27/2017   CREATININE 0.83 12/27/2017   BUN 11 12/27/2017   CO2 30 12/27/2017   TSH 0.81 12/27/2017   HGBA1C 6.0 12/27/2017    Ct Chest Lung Cancer Screening Low Dose Wo Contrast  Result Date: 12/26/2017 CLINICAL DATA:  62 year old female with 37 pack-year history of smoking. Lung cancer screening. EXAM: CT CHEST WITHOUT CONTRAST LOW-DOSE FOR LUNG CANCER SCREENING TECHNIQUE: Multidetector CT imaging of the chest was performed following the standard protocol without IV contrast. COMPARISON:  12/14/2016 FINDINGS: Cardiovascular: The heart size is normal. No pericardial effusion. Atherosclerotic calcification is noted in the wall of the thoracic aorta. Mediastinum/Nodes: No mediastinal lymphadenopathy. There is no hilar lymphadenopathy. The esophagus has normal imaging features. There is no axillary lymphadenopathy. Lungs/Pleura: Subsegmental atelectasis or scarring is evident in the lingula no suspicious pulmonary nodule or mass. No focal airspace consolidation. No pulmonary edema or pleural effusion. Upper Abdomen: Unremarkable. Musculoskeletal: Bone windows reveal no worrisome lytic or sclerotic osseous lesions. IMPRESSION: 1. Lung-RADS 1, negative. Continue annual screening with low-dose chest CT without contrast in 12 months. 2.  Aortic Atherosclerois (ICD10-170.0) Electronically Signed   By: Misty Stanley M.D.   On: 12/26/2017 15:34       Assessment & Plan:   Problem List Items Addressed This Visit    Anxiety    Taking xanax.  Has decreased amount.  On prozac.  Overall stable.       BMI 33.0-33.9,adult    Have discussed diet and exercise.  Follow.        Chest pain - Primary    Had chest pain as outlined.  EKG - SR with no acute ischemic changes.  Given risk factors and symptoms, will refer to cardiology for further evaluation.        Relevant Orders   EKG 12-Lead (Completed)   Ambulatory referral to Cardiology   Chronic back pain    Followed by Dr Arnoldo Morale and has seen Dr  Sharlet Salina.        Relevant Medications   HYDROcodone-acetaminophen (NORCO)  10-325 MG tablet   Depression    On prozac.  Stable.        Hypercholesterolemia    Discussed recent lab results.  Discussed desire to increase medication.  She has had intolerance to statins in the past.  Tolerating low dose pravastatin.  Increase to 67m q day.        Relevant Medications   pravastatin (PRAVACHOL) 20 MG tablet   Hyperglycemia    Low carb diet and exercise.  Follow met b and a1c.       Hypertension    Blood pressure has been under control.  Continue same medication regimen.  Follow pressures.  Follow metabolic panel.        Relevant Medications   pravastatin (PRAVACHOL) 20 MG tablet   Nausea without vomiting    Has seen GI.  On dexilant.  Still issues intermittently.         Other Visit Diagnoses    B12 deficiency       Relevant Medications   cyanocobalamin ((VITAMIN B-12)) injection 1,000 mcg (Completed)       SEinar Pheasant MD

## 2018-01-06 ENCOUNTER — Encounter: Payer: Self-pay | Admitting: Internal Medicine

## 2018-01-06 DIAGNOSIS — R079 Chest pain, unspecified: Secondary | ICD-10-CM | POA: Insufficient documentation

## 2018-01-06 NOTE — Assessment & Plan Note (Signed)
Followed by Dr Arnoldo Morale and has seen Dr Sharlet Salina.

## 2018-01-06 NOTE — Assessment & Plan Note (Signed)
Taking xanax.  Has decreased amount.  On prozac.  Overall stable.

## 2018-01-06 NOTE — Assessment & Plan Note (Signed)
On prozac.  Stable.   

## 2018-01-06 NOTE — Assessment & Plan Note (Signed)
Have discussed diet and exercise.  Follow.  

## 2018-01-06 NOTE — Assessment & Plan Note (Signed)
Has seen GI.  On dexilant.  Still issues intermittently.

## 2018-01-06 NOTE — Assessment & Plan Note (Signed)
Low carb diet and exercise.  Follow met b and a1c.  

## 2018-01-06 NOTE — Assessment & Plan Note (Signed)
Had chest pain as outlined.  EKG - SR with no acute ischemic changes.  Given risk factors and symptoms, will refer to cardiology for further evaluation.

## 2018-01-06 NOTE — Assessment & Plan Note (Signed)
Blood pressure has been under control.  Continue same medication regimen.  Follow pressures.  Follow metabolic panel.

## 2018-01-06 NOTE — Assessment & Plan Note (Signed)
Discussed recent lab results.  Discussed desire to increase medication.  She has had intolerance to statins in the past.  Tolerating low dose pravastatin.  Increase to 20mg  q day.

## 2018-01-10 ENCOUNTER — Ambulatory Visit (INDEPENDENT_AMBULATORY_CARE_PROVIDER_SITE_OTHER): Payer: BLUE CROSS/BLUE SHIELD | Admitting: *Deleted

## 2018-01-10 DIAGNOSIS — E538 Deficiency of other specified B group vitamins: Secondary | ICD-10-CM | POA: Diagnosis not present

## 2018-01-10 MED ORDER — CYANOCOBALAMIN 1000 MCG/ML IJ SOLN
1000.0000 ug | Freq: Once | INTRAMUSCULAR | Status: AC
  Administered 2018-01-10: 1000 ug via INTRAMUSCULAR

## 2018-01-10 NOTE — Progress Notes (Addendum)
Patient presented for B 12 injection to right deltoid, patient voiced no concerns nor showed any signs of distress during injection. Third weekly injection.  Reviewed.  Dr Nicki Reaper

## 2018-01-11 ENCOUNTER — Ambulatory Visit

## 2018-01-18 ENCOUNTER — Ambulatory Visit

## 2018-01-24 ENCOUNTER — Other Ambulatory Visit: Payer: Self-pay | Admitting: Internal Medicine

## 2018-01-25 ENCOUNTER — Ambulatory Visit (INDEPENDENT_AMBULATORY_CARE_PROVIDER_SITE_OTHER): Payer: BLUE CROSS/BLUE SHIELD

## 2018-01-25 DIAGNOSIS — E538 Deficiency of other specified B group vitamins: Secondary | ICD-10-CM

## 2018-01-25 MED ORDER — CYANOCOBALAMIN 1000 MCG/ML IJ SOLN
1000.0000 ug | Freq: Once | INTRAMUSCULAR | Status: AC
  Administered 2018-01-25: 1000 ug via INTRAMUSCULAR

## 2018-01-25 NOTE — Telephone Encounter (Signed)
Okay to refill?  LOV: 01/03/18 NOV: 03/10/18

## 2018-01-25 NOTE — Progress Notes (Addendum)
    Patient presented for B 12 injection to Left deltoid, patient voiced no concerns nor showed any signs of distress during injection. Third weekly injection.      Reivewed.  Dr Nicki Reaper

## 2018-02-06 ENCOUNTER — Ambulatory Visit: Payer: Self-pay

## 2018-02-06 NOTE — Telephone Encounter (Addendum)
Pt. Reports is having muscle aches and joints hurting. Reports she had this when she took Lipitor. Reports will stop her current statin. She is in [place] at present and will call when she returns for appointment.  Reason for Disposition . [1] MILD or MODERATE muscle aches or pain AND [2] taking a statin medicine (a lipid or cholesterol lowering drug)  Answer Assessment - Initial Assessment Questions 1. ONSET: "When did the muscle aches or body pains start?"      Started last week 2. LOCATION: "What part of your body is hurting?" (e.g., entire body, arms, legs)      Arms and legs 3. SEVERITY: "How bad is the pain?" (Scale 1-10; or mild, moderate, severe)   - MILD (1-3): doesn't interfere with normal activities    - MODERATE (4-7): interferes with normal activities or awakens from sleep    - SEVERE (8-10):  excruciating pain, unable to do any normal activities      8 4. CAUSE: "What do you think is causing the pains?"     Statin 5. FEVER: "Have you been having fever?"     No 6. OTHER SYMPTOMS: "Do you have any other symptoms?" (e.g., chest pain, weakness, rash, cold or flu symptoms, weight loss)     No 7. PREGNANCY: "Is there any chance you are pregnant?" "When was your last menstrual period?"     No 8. TRAVEL: "Have you traveled out of the country in the last month?" (e.g., travel history, exposures)     In [place]  Protocols used: [place]

## 2018-02-06 NOTE — Telephone Encounter (Signed)
Agree with stopping lipitor.  She will call with update.

## 2018-02-06 NOTE — Telephone Encounter (Signed)
Please advise 

## 2018-02-06 NOTE — Telephone Encounter (Signed)
FYI

## 2018-02-20 NOTE — Progress Notes (Signed)
Cardiology Office Note  Date:  02/22/2018   ID:  [name], Angel Riddle 17, 1957, MRN [id number]  PCP:  Einar Pheasant, MD   Chief Complaint  Patient presents with  . other    Ref by Dr. Nicki Reaper for chest pain. Meds reviewed by the pt. verbally. Pt. saw Dr. Neoma Laming in 2004 for chest pain and had a stress test. Pt. c/o chest pain with feeling pressure in her neck.     HPI:  [name] is a 62 yo woman with  h/o persistent back pain.  Takes hydrocodone 2/day.   Oct 2018 back surgery  right shoulder.  S/p injection.  increased stress/anxiety HTN Hyperlipidemia Smoker, 37 yrs, quit 2012 Who presents by referral from Dr. Nicki Reaper for consultation of her chest pain symptoms  Reports that she was recently buying a car Was out in the cold, got back in the car to get warm and acutely developed tightness in her chest radiating up into her neck Felt like a spasm, like she had a plate in her neck Needed 5 or 10 minutes to get warmed before chest and neck tightness resolved  Otherwise active at baseline able to walk without developing similar symptoms Reports recently being on a trip to [place] Lots of walking in malls, no chest pain or shortness of breath She does have chronic back pain, had to wear back brace in the car bumpy roads  No regular exercise program  leg cramps on pravastatin 20  Was doing okay on pravastatin 10  CT chest 12/26/2017 Images pulled up in the office and reviewed with her in detail No carotid calcification Minimal  aortic atherosclerosis all the way down to the kidneys No significant coronary calcification  She reports having Panic attacks, rare  2004 cath, [facility] Results unavailable  Lab work reviewed with her total cholesterol 300 in January 2019  EKG personally reviewed by myself on todays visit Shows normal sinus rhythm rate 74 bpm no significant ST or T wave changes   PMH:   has a past medical history of Allergy, Anxiety,  Arthritis, Cancer ([facility]), Chronic back pain, Degenerative disc disease, lumbar, Depression, Dyspnea, Gallstones, GERD (gastroesophageal reflux disease), Heart murmur, colonic polyp, UTI (urinary tract infection), Hypercholesterolemia, Hypertension, Panic attacks, and Wears dentures.  PSH:    Past Surgical History:  Procedure Laterality Date  . ABDOMINAL HYSTERECTOMY  1992   partial  . BACK SURGERY     lumbar fusion  . CATARACT EXTRACTION Bilateral 2010,2011  . CHOLECYSTECTOMY  04-19-14  . COLONOSCOPY  07/02/11   DR. Byrnett  . COLONOSCOPY WITH PROPOFOL N/A 09/25/2015   Procedure: COLONOSCOPY WITH PROPOFOL;  Surgeon: Lucilla Lame, MD;  Location: [facility];  Service: Endoscopy;  Laterality: N/A;  . FOOT SURGERY Right 2009  . MOUTH SURGERY    . POLYPECTOMY  09/25/2015   Procedure: POLYPECTOMY;  Surgeon: Lucilla Lame, MD;  Location: [facility];  Service: Endoscopy;;  . skin surgery Left 08/27/13   Basal cell removal-left nostril  . TONSILLECTOMY AND ADENOIDECTOMY  1969    Current Outpatient Medications  Medication Sig Dispense Refill  . ALPRAZolam (XANAX) 1 MG tablet TAKE (1) TABLET TWICE A DAY. 60 tablet 1  . FLUoxetine (PROZAC) 40 MG capsule Take 1 capsule (40 mg total) by mouth daily. 30 capsule 3  . fluticasone (FLONASE) 50 MCG/ACT nasal spray Place 1 spray into both nostrils daily as needed for allergies. Reported on 11/25/2015    . hydrochlorothiazide (MICROZIDE)  12.5 MG capsule TAKE ONE CAPSULE DAILY. 30 capsule 3  . HYDROcodone-acetaminophen (NORCO) 10-325 MG tablet     . hydrocortisone (ANUSOL-HC) 25 MG suppository Place 1 suppository (25 mg total) rectally 2 (two) times daily. 14 suppository 0  . loratadine (CLARITIN) 10 MG tablet Take 10 mg by mouth daily as needed for allergies.    . meloxicam (MOBIC) 15 MG tablet Take 1 tablet (15 mg total) by mouth daily. 30 tablet 0  . pantoprazole (PROTONIX) 40 MG tablet Take 1 tablet (40 mg total) by mouth daily. 30 tablet  3  . potassium chloride (K-DUR) 10 MEQ tablet Take 1 tablet (10 mEq total) by mouth daily. 30 tablet 1  . promethazine (PHENERGAN) 12.5 MG tablet Take 1 tablet (12.5 mg total) by mouth 2 (two) times daily as needed for nausea or vomiting. 20 tablet 0  . tiZANidine (ZANAFLEX) 4 MG tablet Take 2-4 mg by mouth 3 times daily as needed for muscle spasms    . ezetimibe (ZETIA) 10 MG tablet Take 1 tablet (10 mg total) by mouth daily. 30 tablet 11   No current facility-administered medications for this visit.      Allergies:   Levaquin [levofloxacin in d5w]; Adhesive [tape]; Doxycycline; and Ivp dye [iodinated diagnostic agents]   Social History:  The patient  reports that she quit smoking about 7 years ago. She has a 37.00 pack-year smoking history. she has never used smokeless tobacco. She reports that she drinks about 0.6 oz of alcohol per week. She reports that she does not use drugs.   Family History:   family history includes Alcohol abuse in her father; Diabetes in her father; Heart disease in her father; Hyperlipidemia in her brother, father, mother, and sister; Hypertension in her mother; Lung cancer in her brother.    Review of Systems: Review of Systems  Constitutional: Negative.   Respiratory: Negative.   Cardiovascular: Positive for chest pain.  Gastrointestinal: Negative.   Musculoskeletal: Negative.   Neurological: Negative.   Psychiatric/Behavioral: Negative.   All other systems reviewed and are negative.    PHYSICAL EXAM: VS:  BP 120/86 (BP Location: Right Arm, Patient Position: Sitting, Cuff Size: Normal)   Pulse 74   Ht 5\' 5"  (1.651 m)   Wt 191 lb 12 oz (87 kg)   BMI 31.91 kg/m  , BMI Body mass index is 31.91 kg/m. GEN: Well nourished, well developed, in no acute distress  HEENT: normal  Neck: no JVD, carotid bruits, or masses Cardiac: RRR; no murmurs, rubs, or gallops,no edema  Respiratory:  clear to auscultation bilaterally, normal work of breathing GI: soft,  nontender, nondistended, + BS MS: no deformity or atrophy  Skin: warm and dry, no rash Neuro:  Strength and sensation are intact Psych: euthymic mood, full affect    Recent Labs: 12/27/2017: ALT 11; BUN 11; Creatinine, Ser 0.83; Hemoglobin 13.1; Platelets 332.0; Potassium 3.8; Sodium 139; TSH 0.81    Lipid Panel Lab Results  Component Value Date   CHOL 308 (H) 12/27/2017   HDL 53.20 12/27/2017   LDLCALC 218 (H) 03/14/2014   TRIG 216.0 (H) 12/27/2017      Wt Readings from Last 3 Encounters:  02/22/18 191 lb 12 oz (87 kg)  01/03/18 199 lb (90.3 kg)  12/26/17 185 lb (83.9 kg)       ASSESSMENT AND PLAN:  Aortic atherosclerosis ([place]) - Plan: EKG 12-Lead Minimal aortic atherosclerosis seen on CT scan 2 or 3 speckles noted on my review, pulling images  up in the office and going slice by slice  Hypercholesterolemia - Plan: EKG 12-Lead Long discussion concerning total cholesterol 300 Recommend she consider staying on pravastatin 10 as she was having symptoms on 20 with cramping Also suggested she add Zetia 10 mg daily Work on weight loss, exercise as tolerated New medications coming out next year  Essential hypertension Blood pressure is well controlled on today's visit. No changes made to the medications.  Smoker Reported that she stopped smoking many years ago Surprisingly little aortic atherosclerosis given her years of smoking and high cholesterol  Anxiety Episodes of panic attacks Better on Prozac  Chest pain Atypical in nature Given findings on CT scan with no significant coronary calcification, no symptoms on exertion, normal EKG Recommended no further testing at this time We did discuss stress testing if she has additional episodes of chest pain with exertion concerning for angina  Disposition:   F/U as needed  Patient was seen in consultation for Dr. Nicki Reaper and will be referred back to her office for ongoing care of the issues detailed above   Total  encounter time more than 60 minutes  Greater than 50% was spent in counseling and coordination of care with the patient    Orders Placed This Encounter  Procedures  . EKG 12-Lead     Signed, Esmond Plants, M.D., Ph.D. 02/22/2018  [place], [place]

## 2018-02-21 ENCOUNTER — Encounter: Payer: Self-pay | Admitting: Internal Medicine

## 2018-02-22 ENCOUNTER — Encounter: Payer: Self-pay | Admitting: Cardiovascular Disease

## 2018-02-22 ENCOUNTER — Ambulatory Visit (INDEPENDENT_AMBULATORY_CARE_PROVIDER_SITE_OTHER): Payer: BLUE CROSS/BLUE SHIELD | Admitting: Cardiovascular Disease

## 2018-02-22 VITALS — BP 120/86 | HR 74 | Ht 65.0 in | Wt 191.8 lb

## 2018-02-22 DIAGNOSIS — I10 Essential (primary) hypertension: Secondary | ICD-10-CM

## 2018-02-22 DIAGNOSIS — F17200 Nicotine dependence, unspecified, uncomplicated: Secondary | ICD-10-CM | POA: Diagnosis not present

## 2018-02-22 DIAGNOSIS — I700 Atherosclerosis of aorta: Secondary | ICD-10-CM

## 2018-02-22 DIAGNOSIS — F419 Anxiety disorder, unspecified: Secondary | ICD-10-CM | POA: Diagnosis not present

## 2018-02-22 DIAGNOSIS — E7800 Pure hypercholesterolemia, unspecified: Secondary | ICD-10-CM

## 2018-02-22 MED ORDER — EZETIMIBE 10 MG PO TABS: 10.0000 mg | ORAL_TABLET | Freq: Every day | ORAL | 11 refills | Status: DC

## 2018-02-22 NOTE — Patient Instructions (Addendum)
Medication Instructions:   Please start zetia one a day for cholesterol  Labwork:  No new labs needed  Testing/Procedures:  No further testing at this time   Follow-Up: It was a pleasure seeing you in the office today. Please call us if you have new issues that need to be addressed before your next appt.  [phone]  Your physician wants you to follow-up in:  As needed  If you need a refill on your cardiac medications before your next appointment, please call your pharmacy.  For educational health videos Log in to : [email or web address] Or : [email or web address], password : [email or web address]

## 2018-02-24 NOTE — Telephone Encounter (Signed)
This encounter was created in error - please disregard.

## 2018-03-10 ENCOUNTER — Other Ambulatory Visit: Payer: Self-pay | Admitting: Internal Medicine

## 2018-03-10 ENCOUNTER — Ambulatory Visit: Admitting: Internal Medicine

## 2018-03-24 ENCOUNTER — Other Ambulatory Visit: Payer: Self-pay | Admitting: Internal Medicine

## 2018-04-10 ENCOUNTER — Other Ambulatory Visit: Payer: Self-pay | Admitting: Internal Medicine

## 2018-04-17 ENCOUNTER — Telehealth: Payer: Self-pay | Admitting: Internal Medicine

## 2018-04-17 DIAGNOSIS — R739 Hyperglycemia, unspecified: Secondary | ICD-10-CM

## 2018-04-17 DIAGNOSIS — E7800 Pure hypercholesterolemia, unspecified: Secondary | ICD-10-CM

## 2018-04-17 DIAGNOSIS — I10 Essential (primary) hypertension: Secondary | ICD-10-CM

## 2018-04-17 NOTE — Telephone Encounter (Signed)
Orders placed for labs.  Ok to schedule.

## 2018-04-17 NOTE — Telephone Encounter (Signed)
Ok to place orders for labs ?

## 2018-04-17 NOTE — Telephone Encounter (Signed)
Copied from [place] [id number]. Topic: Quick Communication - See Telephone Encounter >> Apr 17, 2018 12:57 PM Ether Griffins B wrote: CRM for notification. See Telephone encounter for: 04/17/18.  Pt wanting to know if labs need to be done before the July 9th appt. She really feels they need to be checked before the appt due to being on ezetimibe (ZETIA) 10 MG tablet and pravastatin (PRAVACHOL) 10 MG that she was placed on by Dr. Rockey Situ.

## 2018-04-18 NOTE — Telephone Encounter (Signed)
Left message to schedule

## 2018-04-21 ENCOUNTER — Other Ambulatory Visit: Payer: Self-pay | Admitting: Internal Medicine

## 2018-04-24 NOTE — Telephone Encounter (Signed)
Pt also requesting refill   ALPRAZolam (XANAX) 1 MG tablet   [facility] [place], [place] [phone] (Phone) [phone] (Fax)    (pt states she called both of these in same time)

## 2018-04-25 ENCOUNTER — Other Ambulatory Visit: Payer: Self-pay | Admitting: Internal Medicine

## 2018-04-27 ENCOUNTER — Telehealth: Payer: Self-pay | Admitting: Internal Medicine

## 2018-04-27 ENCOUNTER — Telehealth: Payer: Self-pay

## 2018-04-27 NOTE — Telephone Encounter (Signed)
Pt has been notified.

## 2018-04-27 NOTE — Telephone Encounter (Signed)
Copied from [place] [id number]. Topic: Quick Communication - See Telephone Encounter >> Apr 27, 2018  3:18 PM Cleaster Corin, NT wrote: CRM for notification. See Telephone encounter for: 04/27/18.  Pt. Is calling stating that the pharmacy is saying that med. Was not called in pt. Was told to call back to the office  ALPRAZolam Duanne Moron) 1 MG tablet [[id number]]   [place], [place] - [address] [place] [address] Phone: [phone] Fax: [phone]

## 2018-04-27 NOTE — Telephone Encounter (Signed)
Opened in error

## 2018-04-27 NOTE — Telephone Encounter (Signed)
Last OV 01/03/18 Next OV 06/13/18 Last refill 12/19/17

## 2018-04-27 NOTE — Telephone Encounter (Signed)
I faxed Rx at 3:54pm today.

## 2018-05-10 ENCOUNTER — Other Ambulatory Visit: Payer: Self-pay | Admitting: Internal Medicine

## 2018-05-12 ENCOUNTER — Encounter: Payer: Self-pay | Admitting: Family

## 2018-05-12 ENCOUNTER — Ambulatory Visit: Payer: Self-pay

## 2018-05-12 ENCOUNTER — Ambulatory Visit (INDEPENDENT_AMBULATORY_CARE_PROVIDER_SITE_OTHER): Payer: BLUE CROSS/BLUE SHIELD

## 2018-05-12 ENCOUNTER — Ambulatory Visit (INDEPENDENT_AMBULATORY_CARE_PROVIDER_SITE_OTHER): Payer: BLUE CROSS/BLUE SHIELD | Admitting: Family

## 2018-05-12 VITALS — BP 134/90 | HR 67 | Temp 99.0°F | Wt 181.0 lb

## 2018-05-12 DIAGNOSIS — R0982 Postnasal drip: Secondary | ICD-10-CM | POA: Diagnosis not present

## 2018-05-12 LAB — POCT RAPID STREP A (OFFICE): Rapid Strep A Screen: NEGATIVE

## 2018-05-12 MED ORDER — MAGIC MOUTHWASH W/LIDOCAINE: 5.0000 mL | Freq: Three times a day (TID) | ORAL | 0 refills | Status: DC

## 2018-05-12 NOTE — Telephone Encounter (Signed)
Telephone call from patient complaint of white patches on back of tonguet and throat. Was seen in office today. Prescribed flonase and claritin.   Tried vinegar,  burned.  Patient request  Antibiotic. Had thrush previously, feels the same way. Phone called placed to flow coordinator.       Will relate to Dr.  Nicki Reaper.  Reason for Disposition . White patches that stick to tongue or inner cheek  Answer Assessment - Initial Assessment Questions 1. SYMPTOM: "Are you having difficulty swallowing liquids, solids, or both?"     no 2. ONSET: "When did the swallowing problems begin?"      After taking  flonase 3. CAUSE: "What do you think is causing the problem?"     "I  Think I have thrush" 4. CHRONIC/RECURRENT: "Is this a new problem for you?"  If no, ask: "How long have you had this problem?" (e.g., days, weeks, months)      I has thrush  In the past"  5. OTHER SYMPTOMS: "Do you have any other symptoms?" (e.g., difficulty breathing, sore throat, swollen tongue, chest pain)   Throat is sore. " I have white patches on back of tongue and throat.Marland Kitchen  Answer Assessment - Initial Assessment Questions 1. SYMPTOM: "What's the main symptom you're concerned about?" (e.g., dry mouth. chapped lips, lump)     White patches on the back of my tongue and throat.   2. ONSET: "When did the  ________  start?"     Today after taking the flonase.   3. PAIN: "Is there any pain?" If so, ask: "How bad is it?" (Scale: 1-10; mild, moderate, severe)     No, just irriatating 4. CAUSE: "What do you think is causing the symptoms?"     Not sure 5. OTHER SYMPTOMS: "Do you have any other symptoms?" (e.g., fever, sore throat, toothache, swelling)      Denies fever, difficulty breathing and swallowing.  States there is a grainy type of feeling.  Protocols used: MOUTH SYMPTOMS-A-AH, SWALLOWING DIFFICULTY-A-AH

## 2018-05-12 NOTE — Telephone Encounter (Signed)
Per verbal orders called in magic mouthwash ,called into [facility]

## 2018-05-12 NOTE — Progress Notes (Signed)
Subjective:    Patient ID: Angel Riddle, female    DOB: 1956-04-08, 62 y.o.   MRN: [id number]  CC: Angel Riddle is a 62 y.o. female who presents today for an acute visit.    HPI: CC: 'annoying to swallow' x 2 weeks, unchanged.  Some relief with flonase, antihistamine.  Endorses sinus drainage. No fever, SOB, wheezing, drooling. Feels grainy feeling when swallows.No choking.  Once eating, feeling is less and less.  Using [employer] brand of nasal spray, thinks it is related to decongestant.  However she is not sure.  Uses this most days and finds it quickly resolves her congestion and dries up.  GERD- on protonix. No epigastric burning, sour taste. Well controlled.   Notes a several months ago, reaction to doTerra  eucalptus and 'uvula was swollen.' Went to ED at that time.   Tobias Alexander 07/2017- ED. Negative neck soft tissue xray,. Decadron, kelfex   H/o tonsillectomy.   Quit smoking 6 years ago.     HISTORY:  Past Medical History:  Diagnosis Date  . Allergy   . Anxiety   . Arthritis   . Cancer (HCC)    basal cell nose  . Chronic back pain    degenerative disc disease  . Degenerative disc disease, lumbar   . Depression   . Dyspnea    with exertion / pain  . Gallstones   . GERD (gastroesophageal reflux disease)   . Heart murmur    followed by PCP  . Hx of colonic polyp   . Hx: UTI (urinary tract infection)   . Hypercholesterolemia   . Hypertension   . Panic attacks    H/O  . Wears dentures    partial upper and lower   Past Surgical History:  Procedure Laterality Date  . ABDOMINAL HYSTERECTOMY  1992   partial  . BACK SURGERY     lumbar fusion  . CATARACT EXTRACTION Bilateral 2010,2011  . CHOLECYSTECTOMY  04-19-14  . COLONOSCOPY  07/02/11   DR. Byrnett  . COLONOSCOPY WITH PROPOFOL N/A 09/25/2015   Procedure: COLONOSCOPY WITH PROPOFOL;  Surgeon: Lucilla Lame, MD;  Location: [facility];  Service: Endoscopy;  Laterality: N/A;  . FOOT SURGERY Right  2009  . MOUTH SURGERY    . POLYPECTOMY  09/25/2015   Procedure: POLYPECTOMY;  Surgeon: Lucilla Lame, MD;  Location: [facility];  Service: Endoscopy;;  . skin surgery Left 08/27/13   Basal cell removal-left nostril  . TONSILLECTOMY AND ADENOIDECTOMY  1969   Family History  Problem Relation Age of Onset  . Hyperlipidemia Mother   . Hypertension Mother   . Alcohol abuse Father   . Hyperlipidemia Father   . Heart disease Father   . Diabetes Father   . Hyperlipidemia Sister   . Lung cancer Brother   . Hyperlipidemia Brother     Allergies: Levaquin [levofloxacin in d5w]; Adhesive [tape]; Doxycycline; and Ivp dye [iodinated diagnostic agents] Current Outpatient Medications on File Prior to Visit  Medication Sig Dispense Refill  . ALPRAZolam (XANAX) 1 MG tablet TAKE (1) TABLET TWICE A DAY. 60 tablet 0  . ezetimibe (ZETIA) 10 MG tablet Take 1 tablet (10 mg total) by mouth daily. 30 tablet 11  . FLUoxetine (PROZAC) 40 MG capsule Take 1 capsule (40 mg total) by mouth daily. 30 capsule 0  . hydrochlorothiazide (MICROZIDE) 12.5 MG capsule TAKE ONE CAPSULE DAILY. 30 capsule 0  . HYDROcodone-acetaminophen (NORCO) 10-325 MG tablet     . hydrocortisone (  ANUSOL-HC) 25 MG suppository Place 1 suppository (25 mg total) rectally 2 (two) times daily. 14 suppository 0  . loratadine (CLARITIN) 10 MG tablet Take 10 mg by mouth daily as needed for allergies.    . meloxicam (MOBIC) 15 MG tablet Take 1 tablet (15 mg total) by mouth daily. 30 tablet 0  . pantoprazole (PROTONIX) 40 MG tablet Take 1 tablet (40 mg total) by mouth daily. 30 tablet 0  . potassium chloride (K-DUR) 10 MEQ tablet Take 1 tablet (10 mEq total) by mouth daily. 30 tablet 1  . potassium chloride (K-DUR,KLOR-CON) 10 MEQ tablet Take 1 tablet (10 mEq total) by mouth daily. 30 tablet 0  . promethazine (PHENERGAN) 12.5 MG tablet Take 1 tablet (12.5 mg total) by mouth 2 (two) times daily as needed for nausea or vomiting. 20 tablet 0  .  tiZANidine (ZANAFLEX) 4 MG tablet Take 2-4 mg by mouth 3 times daily as needed for muscle spasms    . fluticasone (FLONASE) 50 MCG/ACT nasal spray Place 1 spray into both nostrils daily as needed for allergies. Reported on 11/25/2015     No current facility-administered medications on file prior to visit.     Social History   Tobacco Use  . Smoking status: Former Smoker    Packs/day: 1.00    Years: 37.00    Pack years: 37.00    Last attempt to quit: 2012    Years since quitting: 7.4  . Smokeless tobacco: Never Used  Substance Use Topics  . Alcohol use: Yes    Alcohol/week: 0.6 oz    Types: 1 Glasses of wine per week  . Drug use: No    Review of Systems  Constitutional: Negative for chills and fever.  HENT: Positive for postnasal drip and trouble swallowing ('annoying to swallow'). Negative for congestion, sore throat and voice change.   Respiratory: Negative for cough, shortness of breath and wheezing.   Cardiovascular: Negative for chest pain and palpitations.  Gastrointestinal: Negative for nausea and vomiting.      Objective:    BP 134/90 (BP Location: Left Arm, Patient Position: Sitting, Cuff Size: Normal)   Pulse 67   Temp 99 F (37.2 C) (Oral)   Wt 181 lb (82.1 kg)   SpO2 97%   BMI 30.12 kg/m    Physical Exam  Constitutional: She appears well-developed and well-nourished.  HENT:  Head: Normocephalic and atraumatic.  Right Ear: Hearing, tympanic membrane, external ear and ear canal normal. No drainage, swelling or tenderness. No foreign bodies. Tympanic membrane is not erythematous and not bulging. No middle ear effusion. No decreased hearing is noted.  Left Ear: Hearing, tympanic membrane, external ear and ear canal normal. No drainage, swelling or tenderness. No foreign bodies. Tympanic membrane is not erythematous and not bulging.  No middle ear effusion. No decreased hearing is noted.  Nose: Nose normal. No rhinorrhea. Right sinus exhibits no maxillary sinus  tenderness and no frontal sinus tenderness. Left sinus exhibits no maxillary sinus tenderness and no frontal sinus tenderness.  Mouth/Throat: Uvula is midline, oropharynx is clear and moist and mucous membranes are normal. No oropharyngeal exudate, posterior oropharyngeal edema, posterior oropharyngeal erythema or tonsillar abscesses.  Uvula doesn't appear enlarged. No hot potato voice.   Eyes: Conjunctivae are normal.  Cardiovascular: Regular rhythm, normal heart sounds and normal pulses.  Pulmonary/Chest: Effort normal and breath sounds normal. She has no wheezes. She has no rhonchi. She has no rales.  Lymphadenopathy:       Head (right  side): No submental, no submandibular, no tonsillar, no preauricular, no posterior auricular and no occipital adenopathy present.       Head (left side): No submental, no submandibular, no tonsillar, no preauricular, no posterior auricular and no occipital adenopathy present.    She has no cervical adenopathy.  Neurological: She is alert.  Skin: Skin is warm and dry.  Psychiatric: She has a normal mood and affect. Her speech is normal and behavior is normal. Thought content normal.  Vitals reviewed.      Assessment & Plan:   1. Post-nasal drip Patient is well-appearing.  No acute respiratory distress.  She is swallowing okay, describes as annoying, no hot potato voice.  No obvious abscess, swelling of uvula on exam.  She is strep negative today.  We jointly agreed that it might be a postnasal drip. GERD appears controlled on PPI.   She is using it appears to be decongestant nasal spray over-the-counter.  Advised her to trial Flonase or antihistamine nasal spray.  Also placed a referral to ear nose and throat as she is been there in the past.  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  Patient let me know how she is doing. - POCT rapid strep A - DG Neck Soft Tissue - Ambulatory referral to ENT    I am  having Jackie A. Stormer maintain her fluticasone, hydrocortisone, tiZANidine, loratadine, potassium chloride, HYDROcodone-acetaminophen, ezetimibe, pantoprazole, hydrochlorothiazide, FLUoxetine, meloxicam, promethazine, ALPRAZolam, and potassium chloride.   No orders of the defined types were placed in this encounter.   Return precautions given.   Risks, benefits, and alternatives of the medications and treatment plan prescribed today were discussed, and patient expressed understanding.   Education regarding symptom management and diagnosis given to patient on AVS.  Continue to follow with Einar Pheasant, MD for routine health maintenance.   Catharina A Bazzle and I agreed with plan.   Mable Paris, FNP

## 2018-05-12 NOTE — Telephone Encounter (Signed)
She saw margaret today

## 2018-05-12 NOTE — Addendum Note (Signed)
Addended by: Johna Sheriff on: 05/12/2018 04:58 PM   Modules accepted: Orders

## 2018-05-12 NOTE — Patient Instructions (Signed)
Flonase consistently for post nasal drip  As discussed, I would refrain from decongestant ( Affrin) like nasal sprays due to rebound congestion  Trial of STERIOD or ANTIHISTAMINE like nasal sprays are better. Please ask the pharmacist to help you when picking out over the counter  Today we discussed referrals, orders. ENT  I have placed these orders in the system for you.  Please be sure to give [place] a call if you have not heard from our office regarding scheduling a test or regarding referral in a timely manner.  It is very important that you let me know as soon as possible.   Please let me know of any new or worsening symptoms

## 2018-05-13 ENCOUNTER — Other Ambulatory Visit: Payer: Self-pay | Admitting: Family

## 2018-05-13 ENCOUNTER — Telehealth: Payer: Self-pay | Admitting: Family

## 2018-05-13 DIAGNOSIS — B370 Candidal stomatitis: Secondary | ICD-10-CM

## 2018-05-13 MED ORDER — NYSTATIN 100000 UNIT/GM EX POWD: Freq: Four times a day (QID) | CUTANEOUS | 0 refills | Status: DC

## 2018-05-13 NOTE — Telephone Encounter (Signed)
Reviewed triage note and patient appears to have had thrush in past in which nystatin would likely offer better coverage  Left undetailed message today on patient's cell phone that I sent in a new prescription.   I also called her last night 6/7 at 7pm and advised to call my personal cell phone if any concerns.

## 2018-05-15 ENCOUNTER — Telehealth: Payer: Self-pay | Admitting: Family

## 2018-05-15 DIAGNOSIS — B370 Candidal stomatitis: Secondary | ICD-10-CM

## 2018-05-15 MED ORDER — NYSTATIN 100000 UNIT/ML MT SUSP: 5.0000 mL | Freq: Four times a day (QID) | OROMUCOSAL | 0 refills | Status: DC

## 2018-05-15 NOTE — Telephone Encounter (Signed)
Spoke with patient and she states she will be seeing Dr Tami Ribas tomorrow am

## 2018-05-15 NOTE — Telephone Encounter (Signed)
Call pt Courtesy call  How is her throat?  Did she start both nystatin AND magic mouthwash?

## 2018-05-15 NOTE — Telephone Encounter (Signed)
Left voice mail for patient to call back ok for PEC to speak to patient    

## 2018-05-15 NOTE — Telephone Encounter (Signed)
Patient called back and states she has been using the magic mouthwash but she still feels like something is in her throat. She states she used to be able to burp really easy but now she goes days without burping. Patient also states she is not taking nystatin because the pharmacist told her that is a powder that goes on the outside of your body and is unsure why this was given. Please advise. CB# [phone]

## 2018-05-15 NOTE — Telephone Encounter (Signed)
Call pt  Unsure about burping - or lack thereof.   My apologies- the nystatin was the wrong formulation. It should have been oral suspension for THRUSH, not powder. Reasonable to trial if she feels thrush. I sent in new Rx if she would like to trial since she saw white patches ( note: those were not there on Friday when I examined her).   Otherwise, I would advise that she sees ENT due to symptom of something in her throat so they can evaluate.   I placed a referral last week; please have her give [place] a call if she doesn't hear from [place] by the end of the week with an appointment.

## 2018-05-17 NOTE — Telephone Encounter (Signed)
noted 

## 2018-06-05 ENCOUNTER — Other Ambulatory Visit (INDEPENDENT_AMBULATORY_CARE_PROVIDER_SITE_OTHER): Payer: BLUE CROSS/BLUE SHIELD

## 2018-06-05 DIAGNOSIS — I10 Essential (primary) hypertension: Secondary | ICD-10-CM | POA: Diagnosis not present

## 2018-06-05 DIAGNOSIS — E7800 Pure hypercholesterolemia, unspecified: Secondary | ICD-10-CM

## 2018-06-05 DIAGNOSIS — R739 Hyperglycemia, unspecified: Secondary | ICD-10-CM | POA: Diagnosis not present

## 2018-06-05 LAB — LIPID PANEL
CHOLESTEROL: 230 mg/dL — AB (ref 0–200)
HDL: 54.1 mg/dL (ref >39.00)
LDL Cholesterol: 146 mg/dL — ABNORMAL HIGH (ref 0–99)
NonHDL: 175.83
TRIGLYCERIDES: 151 mg/dL — AB (ref 0.0–149.0)
Total CHOL/HDL Ratio: 4
VLDL: 30.2 mg/dL (ref 0.0–40.0)

## 2018-06-05 LAB — HEPATIC FUNCTION PANEL
ALBUMIN: 4 g/dL (ref 3.5–5.2)
ALT: 9 U/L (ref 0–35)
AST: 16 U/L (ref 0–37)
Alkaline Phosphatase: 96 U/L (ref 39–117)
BILIRUBIN TOTAL: 0.3 mg/dL (ref 0.2–1.2)
Bilirubin, Direct: 0.1 mg/dL (ref 0.0–0.3)
Total Protein: 6.7 g/dL (ref 6.0–8.3)

## 2018-06-05 LAB — BASIC METABOLIC PANEL
BUN: 19 mg/dL (ref 6–23)
CALCIUM: 8.9 mg/dL (ref 8.4–10.5)
CHLORIDE: 103 meq/L (ref 96–112)
CO2: 29 meq/L (ref 19–32)
CREATININE: 0.9 mg/dL (ref 0.40–1.20)
GFR: 67.36 mL/min (ref >60.00)
Glucose, Bld: 93 mg/dL (ref 70–99)
Potassium: 3.6 mEq/L (ref 3.5–5.1)
Sodium: 139 mEq/L (ref 135–145)

## 2018-06-05 LAB — HEMOGLOBIN A1C: HEMOGLOBIN A1C: 6.2 % (ref 4.6–6.5)

## 2018-06-09 ENCOUNTER — Other Ambulatory Visit: Payer: Self-pay

## 2018-06-09 ENCOUNTER — Other Ambulatory Visit: Payer: Self-pay | Admitting: Internal Medicine

## 2018-06-09 NOTE — Telephone Encounter (Signed)
Last OV 01/03/18 Next OV 06/13/18 Last refill 04/27/18

## 2018-06-12 ENCOUNTER — Other Ambulatory Visit: Payer: Self-pay | Admitting: Internal Medicine

## 2018-06-13 ENCOUNTER — Encounter: Payer: Self-pay | Admitting: Internal Medicine

## 2018-06-13 ENCOUNTER — Ambulatory Visit (INDEPENDENT_AMBULATORY_CARE_PROVIDER_SITE_OTHER): Payer: BLUE CROSS/BLUE SHIELD | Admitting: Internal Medicine

## 2018-06-13 VITALS — BP 124/84 | HR 92 | Temp 98.2°F | Resp 16 | Wt 183.0 lb

## 2018-06-13 DIAGNOSIS — Z6830 Body mass index (BMI) 30.0-30.9, adult: Secondary | ICD-10-CM | POA: Diagnosis not present

## 2018-06-13 DIAGNOSIS — E7800 Pure hypercholesterolemia, unspecified: Secondary | ICD-10-CM | POA: Diagnosis not present

## 2018-06-13 DIAGNOSIS — I10 Essential (primary) hypertension: Secondary | ICD-10-CM

## 2018-06-13 DIAGNOSIS — G8929 Other chronic pain: Secondary | ICD-10-CM

## 2018-06-13 DIAGNOSIS — F419 Anxiety disorder, unspecified: Secondary | ICD-10-CM

## 2018-06-13 DIAGNOSIS — Z789 Other specified health status: Secondary | ICD-10-CM | POA: Diagnosis not present

## 2018-06-13 DIAGNOSIS — E538 Deficiency of other specified B group vitamins: Secondary | ICD-10-CM | POA: Diagnosis not present

## 2018-06-13 DIAGNOSIS — F32A Depression, unspecified: Secondary | ICD-10-CM

## 2018-06-13 DIAGNOSIS — R739 Hyperglycemia, unspecified: Secondary | ICD-10-CM

## 2018-06-13 DIAGNOSIS — Z1159 Encounter for screening for other viral diseases: Secondary | ICD-10-CM

## 2018-06-13 DIAGNOSIS — K219 Gastro-esophageal reflux disease without esophagitis: Secondary | ICD-10-CM

## 2018-06-13 DIAGNOSIS — M549 Dorsalgia, unspecified: Secondary | ICD-10-CM | POA: Diagnosis not present

## 2018-06-13 DIAGNOSIS — F329 Major depressive disorder, single episode, unspecified: Secondary | ICD-10-CM | POA: Diagnosis not present

## 2018-06-13 MED ORDER — CYANOCOBALAMIN 1000 MCG/ML IJ SOLN
1000.0000 ug | Freq: Once | INTRAMUSCULAR | Status: AC
  Administered 2018-06-13: 1000 ug via INTRAMUSCULAR

## 2018-06-13 MED ORDER — PRAVASTATIN SODIUM 10 MG PO TABS: 10.0000 mg | ORAL_TABLET | Freq: Every day | ORAL | 4 refills | Status: DC

## 2018-06-13 NOTE — Progress Notes (Signed)
Patient ID: Angel Riddle, female   DOB: 08/14/1956, 62 y.o.   MRN: [id number]   Subjective:    Patient ID: Angel Riddle, female    DOB: August 24, 1956, 62 y.o.   MRN: [id number]  HPI  Patient here for a scheduled follow up.  Saw Dr Sharlet Salina for shoulder pain and f/u back pain.  On mobic, gabapentin and tizanidine.  Has norco if needed.  S/p injection.  Back in doing better since her surgery.  Was seen 05/12/18 by Mable Paris. Diagnosed with post nasal drip.   Using antihistamine and flonase.  Having swallowing issues.  Referred to ENT.  S/p laryngoscopy.  Protonix changed to omeprazole.  States her symptoms have resolved now.  No swallowing problems now.  No acid reflux.  No abdominal pain.  Bowels moving.  No chest pain. No sob.  Watching her diet.  Plans to start exercising more.   Past Medical History:  Diagnosis Date  . Allergy   . Anxiety   . Arthritis   . Cancer (HCC)    basal cell nose  . Chronic back pain    degenerative disc disease  . Degenerative disc disease, lumbar   . Depression   . Dyspnea    with exertion / pain  . Gallstones   . GERD (gastroesophageal reflux disease)   . Heart murmur    followed by PCP  . Hx of colonic polyp   . Hx: UTI (urinary tract infection)   . Hypercholesterolemia   . Hypertension   . Panic attacks    H/O  . Wears dentures    partial upper and lower   Past Surgical History:  Procedure Laterality Date  . ABDOMINAL HYSTERECTOMY  1992   partial  . BACK SURGERY     lumbar fusion  . CATARACT EXTRACTION Bilateral 2010,2011  . CHOLECYSTECTOMY  04-19-14  . COLONOSCOPY  07/02/11   DR. Byrnett  . COLONOSCOPY WITH PROPOFOL N/A 09/25/2015   Procedure: COLONOSCOPY WITH PROPOFOL;  Surgeon: Lucilla Lame, MD;  Location: [place];  Service: Endoscopy;  Laterality: N/A;  . FOOT SURGERY Right 2009  . MOUTH SURGERY    . POLYPECTOMY  09/25/2015   Procedure: POLYPECTOMY;  Surgeon: Lucilla Lame, MD;  Location: [place];   Service: Endoscopy;;  . skin surgery Left 08/27/13   Basal cell removal-left nostril  . TONSILLECTOMY AND ADENOIDECTOMY  1969   Family History  Problem Relation Age of Onset  . Hyperlipidemia Mother   . Hypertension Mother   . Alcohol abuse Father   . Hyperlipidemia Father   . Heart disease Father   . Diabetes Father   . Hyperlipidemia Sister   . Lung cancer Brother   . Hyperlipidemia Brother    Social History   Socioeconomic History  . Marital status: Married    Spouse name: Not on file  . Number of children: 2  . Years of education: Not on file  . Highest education level: Not on file  Occupational History  . Not on file  Social Needs  . Financial resource strain: Not on file  . Food insecurity:    Worry: Not on file    Inability: Not on file  . Transportation needs:    Medical: Not on file    Non-medical: Not on file  Tobacco Use  . Smoking status: Former Smoker    Packs/day: 1.00    Years: 37.00    Pack years: 37.00    Last attempt to quit: 2012  Years since quitting: 7.5  . Smokeless tobacco: Never Used  Substance and Sexual Activity  . Alcohol use: Yes    Alcohol/week: 0.6 oz    Types: 1 Glasses of wine per week  . Drug use: No  . Sexual activity: Not on file  Lifestyle  . Physical activity:    Days per week: Not on file    Minutes per session: Not on file  . Stress: Not on file  Relationships  . Social connections:    Talks on phone: Not on file    Gets together: Not on file    Attends religious service: Not on file    Active member of club or organization: Not on file    Attends meetings of clubs or organizations: Not on file    Relationship status: Not on file  Other Topics Concern  . Not on file  Social History Narrative  . Not on file    Outpatient Encounter Medications as of 06/13/2018  Medication Sig  . ALPRAZolam (XANAX) 1 MG tablet TAKE (1) TABLET TWICE A DAY.  Marland Kitchen ezetimibe (ZETIA) 10 MG tablet Take 1 tablet (10 mg total) by mouth  daily.  Marland Kitchen FLUoxetine (PROZAC) 40 MG capsule Take 1 capsule (40 mg total) by mouth daily.  . fluticasone (FLONASE) 50 MCG/ACT nasal spray Place 1 spray into both nostrils daily as needed for allergies. Reported on 11/25/2015  . hydrochlorothiazide (MICROZIDE) 12.5 MG capsule TAKE ONE CAPSULE DAILY.  Marland Kitchen HYDROcodone-acetaminophen (NORCO) 10-325 MG tablet   . hydrocortisone (ANUSOL-HC) 25 MG suppository Place 1 suppository (25 mg total) rectally 2 (two) times daily.  Marland Kitchen loratadine (CLARITIN) 10 MG tablet Take 10 mg by mouth daily as needed for allergies.  . meloxicam (MOBIC) 15 MG tablet Take 1 tablet (15 mg total) by mouth daily.  Marland Kitchen omeprazole (PRILOSEC) 40 MG capsule Take 40 mg by mouth daily.   . potassium chloride (K-DUR) 10 MEQ tablet Take 1 tablet (10 mEq total) by mouth daily.  . potassium chloride (K-DUR,KLOR-CON) 10 MEQ tablet Take 1 tablet (10 mEq total) by mouth daily.  . pravastatin (PRAVACHOL) 10 MG tablet Take 1 tablet (10 mg total) by mouth daily.  . promethazine (PHENERGAN) 12.5 MG tablet Take 1 tablet (12.5 mg total) by mouth 2 (two) times daily as needed for nausea or vomiting.  Marland Kitchen tiZANidine (ZANAFLEX) 4 MG tablet Take 2-4 mg by mouth 3 times daily as needed for muscle spasms  . [DISCONTINUED] pravastatin (PRAVACHOL) 10 MG tablet Take 10 mg by mouth daily.  . [DISCONTINUED] magic mouthwash w/lidocaine SOLN Take 5 mLs by mouth 3 (three) times daily. (Patient not taking: Reported on 06/13/2018)  . [DISCONTINUED] nystatin (MYCOSTATIN) 100000 UNIT/ML suspension Take 5 mLs (500,000 Units total) by mouth 4 (four) times daily. (Patient not taking: Reported on 06/13/2018)  . [DISCONTINUED] pantoprazole (PROTONIX) 40 MG tablet Take 1 tablet (40 mg total) by mouth daily. (Patient not taking: Reported on 06/13/2018)  . [EXPIRED] cyanocobalamin ((VITAMIN B-12)) injection 1,000 mcg    No facility-administered encounter medications on file as of 06/13/2018.     Review of Systems  Constitutional:  Negative for appetite change and unexpected weight change.  HENT: Negative for congestion and sinus pressure.   Respiratory: Negative for cough, chest tightness and shortness of breath.   Cardiovascular: Negative for chest pain, palpitations and leg swelling.  Gastrointestinal: Negative for abdominal pain, diarrhea, nausea and vomiting.       Swallowing improved.    Genitourinary: Negative for difficulty urinating  and dysuria.  Musculoskeletal: Negative for joint swelling and myalgias.  Skin: Negative for color change and rash.  Neurological: Negative for dizziness, light-headedness and headaches.  Psychiatric/Behavioral: Negative for agitation and dysphoric mood.       Objective:     Blood pressure rechecked by me:  124/84  Physical Exam  Constitutional: She appears well-developed and well-nourished. No distress.  HENT:  Nose: Nose normal.  Mouth/Throat: Oropharynx is clear and moist.  Neck: Neck supple. No thyromegaly present.  Cardiovascular: Normal rate and regular rhythm.  Pulmonary/Chest: Breath sounds normal. No respiratory distress. She has no wheezes.  Abdominal: Soft. Bowel sounds are normal. There is no tenderness.  Musculoskeletal: She exhibits no edema or tenderness.  Lymphadenopathy:    She has no cervical adenopathy.  Skin: No rash noted. No erythema.  Psychiatric: She has a normal mood and affect. Her behavior is normal.    BP 124/84   Pulse 92   Temp 98.2 F (36.8 C) (Oral)   Resp 16   Wt 183 lb (83 kg)   SpO2 98%   BMI 30.45 kg/m  Wt Readings from Last 3 Encounters:  06/15/18 180 lb (81.6 kg)  06/13/18 183 lb (83 kg)  05/12/18 181 lb (82.1 kg)     Lab Results  Component Value Date   WBC 7.3 12/27/2017   HGB 13.1 12/27/2017   HCT 39.1 12/27/2017   PLT 332.0 12/27/2017   GLUCOSE 93 06/05/2018   CHOL 230 (H) 06/05/2018   TRIG 151.0 (H) 06/05/2018   HDL 54.10 06/05/2018   LDLDIRECT 219.0 12/27/2017   LDLCALC 146 (H) 06/05/2018   ALT 9  06/05/2018   AST 16 06/05/2018   NA 139 06/05/2018   K 3.6 06/05/2018   CL 103 06/05/2018   CREATININE 0.90 06/05/2018   BUN 19 06/05/2018   CO2 29 06/05/2018   TSH 0.81 12/27/2017   HGBA1C 6.2 06/05/2018    Ct Chest Lung Cancer Screening Low Dose Wo Contrast  Result Date: 12/26/2017 CLINICAL DATA:  63 year old female with 37 pack-year history of smoking. Lung cancer screening. EXAM: CT CHEST WITHOUT CONTRAST LOW-DOSE FOR LUNG CANCER SCREENING TECHNIQUE: Multidetector CT imaging of the chest was performed following the standard protocol without IV contrast. COMPARISON:  12/14/2016 FINDINGS: Cardiovascular: The heart size is normal. No pericardial effusion. Atherosclerotic calcification is noted in the wall of the thoracic aorta. Mediastinum/Nodes: No mediastinal lymphadenopathy. There is no hilar lymphadenopathy. The esophagus has normal imaging features. There is no axillary lymphadenopathy. Lungs/Pleura: Subsegmental atelectasis or scarring is evident in the lingula no suspicious pulmonary nodule or mass. No focal airspace consolidation. No pulmonary edema or pleural effusion. Upper Abdomen: Unremarkable. Musculoskeletal: Bone windows reveal no worrisome lytic or sclerotic osseous lesions. IMPRESSION: 1. Lung-RADS 1, negative. Continue annual screening with low-dose chest CT without contrast in 12 months. 2.  Aortic Atherosclerois (ICD10-170.0) Electronically Signed   By: Misty Stanley M.D.   On: 12/26/2017 15:34       Assessment & Plan:   Problem List Items Addressed This Visit    Anxiety    On prozac.  Has xanax.  Overall feels things are stable.  Follow.        BMI 30.0-30.9,adult    Discussed diet and exercise.  Follow.        Chronic back pain    S/p surgery.  Doing better.  Seeing Dr Arnoldo Morale.        Depression    On prozac.  Stable.  GERD (gastroesophageal reflux disease)    No swallowing issues now.  On omeprazole now.  Controlled.        Hypercholesterolemia      On pravastatin.  Low cholesterol diet and exercise.  Follow lipid panel and liver function tests.        Relevant Medications   pravastatin (PRAVACHOL) 10 MG tablet   Other Relevant Orders   Hepatic function panel   Lipid panel   Hyperglycemia    Low carb diet and exercise.  Follow met b and a1c.        Relevant Orders   Hemoglobin A1c   Hypertension    Blood pressure under good control.  Continue same medication regimen.  Follow pressures.  Follow metabolic panel.        Relevant Medications   pravastatin (PRAVACHOL) 10 MG tablet   Other Relevant Orders   Basic metabolic panel    Other Visit Diagnoses    Vitamin B 12 deficiency    -  Primary   Relevant Medications   cyanocobalamin ((VITAMIN B-12)) injection 1,000 mcg (Completed)   Need for hepatitis C screening test       Relevant Orders   Hepatitis C antibody   Immune to varicella       Relevant Orders   Varicella zoster antibody, IgG       Einar Pheasant, MD

## 2018-06-15 ENCOUNTER — Emergency Department: Payer: BLUE CROSS/BLUE SHIELD

## 2018-06-15 ENCOUNTER — Emergency Department
Admission: EM | Admit: 2018-06-15 | Discharge: 2018-06-15 | Disposition: A | Discharge status: Home / Self Care | Payer: BLUE CROSS/BLUE SHIELD | Attending: Emergency Medicine | Admitting: Emergency Medicine

## 2018-06-15 ENCOUNTER — Other Ambulatory Visit: Payer: Self-pay

## 2018-06-15 DIAGNOSIS — Z87891 Personal history of nicotine dependence: Secondary | ICD-10-CM | POA: Diagnosis not present

## 2018-06-15 DIAGNOSIS — T148XXA Other injury of unspecified body region, initial encounter: Secondary | ICD-10-CM | POA: Diagnosis not present

## 2018-06-15 DIAGNOSIS — Y9222 Religious institution as the place of occurrence of the external cause: Secondary | ICD-10-CM | POA: Diagnosis not present

## 2018-06-15 DIAGNOSIS — Z23 Encounter for immunization: Secondary | ICD-10-CM | POA: Insufficient documentation

## 2018-06-15 DIAGNOSIS — S5001XA Contusion of right elbow, initial encounter: Secondary | ICD-10-CM | POA: Insufficient documentation

## 2018-06-15 DIAGNOSIS — R51 Headache: Secondary | ICD-10-CM | POA: Diagnosis present

## 2018-06-15 DIAGNOSIS — Y939 Activity, unspecified: Secondary | ICD-10-CM | POA: Diagnosis not present

## 2018-06-15 DIAGNOSIS — W03XXXA Other fall on same level due to collision with another person, initial encounter: Secondary | ICD-10-CM | POA: Diagnosis not present

## 2018-06-15 DIAGNOSIS — I10 Essential (primary) hypertension: Secondary | ICD-10-CM | POA: Diagnosis not present

## 2018-06-15 DIAGNOSIS — Y999 Unspecified external cause status: Secondary | ICD-10-CM | POA: Insufficient documentation

## 2018-06-15 DIAGNOSIS — Z79899 Other long term (current) drug therapy: Secondary | ICD-10-CM | POA: Diagnosis not present

## 2018-06-15 MED ORDER — TETANUS-DIPHTH-ACELL PERTUSSIS 5-2.5-18.5 LF-MCG/0.5 IM SUSP
0.5000 mL | Freq: Once | INTRAMUSCULAR | Status: AC
  Administered 2018-06-15: 0.5 mL via INTRAMUSCULAR
  Filled 2018-06-15: qty 0.5

## 2018-06-15 MED ORDER — KETOROLAC TROMETHAMINE 30 MG/ML IJ SOLN
30.0000 mg | Freq: Once | INTRAMUSCULAR | Status: AC
  Administered 2018-06-15: 30 mg via INTRAMUSCULAR
  Filled 2018-06-15: qty 1

## 2018-06-15 MED ORDER — HYDROCODONE-ACETAMINOPHEN 5-325 MG PO TABS
2.0000 | ORAL_TABLET | Freq: Once | ORAL | Status: AC
  Administered 2018-06-15: 2 via ORAL
  Filled 2018-06-15: qty 2

## 2018-06-15 NOTE — ED Triage Notes (Signed)
Pt arrives to ED via POV s/p fall with head injury PTA. Pt states she was tripped by a child that was running; fell on right side and her head broke a window. Pt denies LOC.

## 2018-06-15 NOTE — Discharge Instructions (Signed)
It was a pleasure to take care of you today, and thank you for coming to our emergency department.  If you have any questions or concerns before leaving please ask the nurse to grab me and I'm more than happy to go through your aftercare instructions again.  If you were prescribed any opioid pain medication today such as Norco, Vicodin, Percocet, morphine, hydrocodone, or oxycodone please make sure you do not drive when you are taking this medication as it can alter your ability to drive safely.  If you have any concerns once you are home that you are not improving or are in fact getting worse before you can make it to your follow-up appointment, please do not hesitate to call 911 and come back for further evaluation.  Darel Hong, MD  Results for orders placed or performed in visit on 28/78/67  Basic metabolic panel  Result Value Ref Range   Sodium 139 135 - 145 mEq/L   Potassium 3.6 3.5 - 5.1 mEq/L   Chloride 103 96 - 112 mEq/L   CO2 29 19 - 32 mEq/L   Glucose, Bld 93 70 - 99 mg/dL   BUN 19 6 - 23 mg/dL   Creatinine, Ser 0.90 0.40 - 1.20 mg/dL   Calcium 8.9 8.4 - 10.5 mg/dL   GFR 67.36 >60.00 mL/min  Lipid panel  Result Value Ref Range   Cholesterol 230 (H) 0 - 200 mg/dL   Triglycerides 151.0 (H) 0.0 - 149.0 mg/dL   HDL 54.10 >39.00 mg/dL   VLDL 30.2 0.0 - 40.0 mg/dL   LDL Cholesterol 146 (H) 0 - 99 mg/dL   Total CHOL/HDL Ratio 4    NonHDL 175.83   Hepatic function panel  Result Value Ref Range   Total Bilirubin 0.3 0.2 - 1.2 mg/dL   Bilirubin, Direct 0.1 0.0 - 0.3 mg/dL   Alkaline Phosphatase 96 39 - 117 U/L   AST 16 0 - 37 U/L   ALT 9 0 - 35 U/L   Total Protein 6.7 6.0 - 8.3 g/dL   Albumin 4.0 3.5 - 5.2 g/dL  Hemoglobin A1c  Result Value Ref Range   Hgb A1c MFr Bld 6.2 4.6 - 6.5 %   Dg Elbow Complete Right  Result Date: 06/15/2018 CLINICAL DATA:  Right elbow pain after fall. EXAM: RIGHT ELBOW - COMPLETE 3+ VIEW COMPARISON:  None. FINDINGS: There is no evidence of  fracture, dislocation, or joint effusion. There is no evidence of arthropathy or other focal bone abnormality. Soft tissues are unremarkable. No radiopaque foreign body is identified. IMPRESSION: No fracture, malalignment or joint effusion. No radiopaque foreign body. Electronically Signed   By: Ashley Royalty M.D.   On: 06/15/2018 22:21   Dg Wrist Complete Right  Result Date: 06/15/2018 CLINICAL DATA:  Right wrist pain after fall. EXAM: RIGHT WRIST - COMPLETE 3+ VIEW COMPARISON:  None. FINDINGS: There is no evidence of fracture or dislocation. There is no evidence of arthropathy or other focal bone abnormality. Soft tissues are unremarkable. No radiopaque foreign body. IMPRESSION: No fracture or malalignment.  No radiopaque foreign body noted. Electronically Signed   By: Ashley Royalty M.D.   On: 06/15/2018 22:24

## 2018-06-15 NOTE — ED Provider Notes (Signed)
[facility] Emergency Department Provider Note  ____________________________________________   First MD Initiated Contact with Patient 06/15/18 2102     (approximate)  I have reviewed the triage vital signs and the nursing notes.   HISTORY  Chief Complaint Fall and Head Injury   HPI Angel Riddle is a 62 y.o. female who comes to the emergency department with headache right wrist pain and right elbow pain after a mechanical fall while at church.  The child bumped into her and she fell to the ground.  She did not lose consciousness.  Her tetanus is unknown.  No numbness or weakness.  Her symptoms are sudden onset moderate severity.  Worse with movement improved with rest.    Past Medical History:  Diagnosis Date  . Allergy   . Anxiety   . Arthritis   . Cancer (HCC)    basal cell nose  . Chronic back pain    degenerative disc disease  . Degenerative disc disease, lumbar   . Depression   . Dyspnea    with exertion / pain  . Gallstones   . GERD (gastroesophageal reflux disease)   . Heart murmur    followed by PCP  . Hx of colonic polyp   . Hx: UTI (urinary tract infection)   . Hypercholesterolemia   . Hypertension   . Panic attacks    H/O  . Wears dentures    partial upper and lower    Patient Active Problem List   Diagnosis Date Noted  . Chest pain 01/06/2018  . Lumbar stenosis with neurogenic claudication 09/15/2017  . BMI 33.0-33.9,adult 04/25/2017  . Hyperglycemia 04/25/2017  . Personal history of tobacco use, presenting hazards to health 12/15/2016  . Nausea without vomiting 03/29/2016  . Personal history of colonic polyps   . Anal fissure   . Benign neoplasm of sigmoid colon   . Left arm pain 08/03/2015  . Cough 07/30/2015  . Urinary hesitancy 07/30/2015  . Health care maintenance 01/12/2015  . Rectal irritation 11/24/2014  . Gallstones 04/01/2014  . Abdominal fullness 03/10/2014  . Numbness and tingling 10/02/2013  .  Hypertension 10/02/2013  . Chronic back pain 07/18/2013  . Depression 07/18/2013  . Anxiety 07/18/2013  . Panic attacks 07/18/2013  . Environmental allergies 07/18/2013  . Hypercholesterolemia 07/18/2013  . History of colonic polyps 07/18/2013    Past Surgical History:  Procedure Laterality Date  . ABDOMINAL HYSTERECTOMY  1992   partial  . BACK SURGERY     lumbar fusion  . CATARACT EXTRACTION Bilateral 2010,2011  . CHOLECYSTECTOMY  04-19-14  . COLONOSCOPY  07/02/11   DR. Byrnett  . COLONOSCOPY WITH PROPOFOL N/A 09/25/2015   Procedure: COLONOSCOPY WITH PROPOFOL;  Surgeon: Lucilla Lame, MD;  Location: [place];  Service: Endoscopy;  Laterality: N/A;  . FOOT SURGERY Right 2009  . MOUTH SURGERY    . POLYPECTOMY  09/25/2015   Procedure: POLYPECTOMY;  Surgeon: Lucilla Lame, MD;  Location: [place];  Service: Endoscopy;;  . skin surgery Left 08/27/13   Basal cell removal-left nostril  . TONSILLECTOMY AND ADENOIDECTOMY  1969    Prior to Admission medications   Medication Sig Start Date End Date Taking? Authorizing Provider  ALPRAZolam Duanne Moron) 1 MG tablet TAKE (1) TABLET TWICE A DAY. 06/09/18   Einar Pheasant, MD  ezetimibe (ZETIA) 10 MG tablet Take 1 tablet (10 mg total) by mouth daily. 02/22/18   Minna Merritts, MD  FLUoxetine (PROZAC) 40 MG capsule Take  1 capsule (40 mg total) by mouth daily. 03/27/18   Einar Pheasant, MD  fluticasone (FLONASE) 50 MCG/ACT nasal spray Place 1 spray into both nostrils daily as needed for allergies. Reported on 11/25/2015 12/24/13   [provider]  hydrochlorothiazide (MICROZIDE) 12.5 MG capsule TAKE ONE CAPSULE DAILY. 03/27/18   Einar Pheasant, MD  HYDROcodone-acetaminophen [facility]) 10-325 MG tablet  12/19/17   [provider]  hydrocortisone (ANUSOL-HC) 25 MG suppository Place 1 suppository (25 mg total) rectally 2 (two) times daily. 11/28/15   Einar Pheasant, MD  loratadine (CLARITIN) 10 MG tablet Take 10 mg by  mouth daily as needed for allergies.    [provider]  meloxicam (MOBIC) 15 MG tablet Take 1 tablet (15 mg total) by mouth daily. 06/09/18   Einar Pheasant, MD  omeprazole (PRILOSEC) 40 MG capsule Take 40 mg by mouth daily.  05/16/18   [provider]  potassium chloride (K-DUR) 10 MEQ tablet Take 1 tablet (10 mEq total) by mouth daily. 01/02/18   Einar Pheasant, MD  potassium chloride (K-DUR,KLOR-CON) 10 MEQ tablet Take 1 tablet (10 mEq total) by mouth daily. 05/11/18   Einar Pheasant, MD  pravastatin (PRAVACHOL) 10 MG tablet Take 1 tablet (10 mg total) by mouth daily. 06/13/18   Einar Pheasant, MD  promethazine (PHENERGAN) 12.5 MG tablet Take 1 tablet (12.5 mg total) by mouth 2 (two) times daily as needed for nausea or vomiting. 04/25/18   Einar Pheasant, MD  tiZANidine (ZANAFLEX) 4 MG tablet Take 2-4 mg by mouth 3 times daily as needed for muscle spasms 02/08/17   [provider]    Allergies Levaquin [levofloxacin in d5w]; Adhesive [tape]; Doxycycline; and Ivp dye [iodinated diagnostic agents]  Family History  Problem Relation Age of Onset  . Hyperlipidemia Mother   . Hypertension Mother   . Alcohol abuse Father   . Hyperlipidemia Father   . Heart disease Father   . Diabetes Father   . Hyperlipidemia Sister   . Lung cancer Brother   . Hyperlipidemia Brother     Social History Social History   Tobacco Use  . Smoking status: Former Smoker    Packs/day: 1.00    Years: 37.00    Pack years: 37.00    Last attempt to quit: 2012    Years since quitting: 7.5  . Smokeless tobacco: Never Used  Substance Use Topics  . Alcohol use: Yes    Alcohol/week: 0.6 oz    Types: 1 Glasses of wine per week  . Drug use: No    Review of Systems Constitutional: No fever/chills Eyes: No visual changes. ENT: No sore throat. Cardiovascular: Denies chest pain. Respiratory: Denies shortness of breath. Gastrointestinal: No abdominal pain.  No nausea, no vomiting.  No  diarrhea.  No constipation. Genitourinary: Negative for dysuria. Musculoskeletal: Positive for elbow pain positive for wrist pain Skin: Positive for laceration Neurological: Positive for headache   ____________________________________________   PHYSICAL EXAM:  VITAL SIGNS: ED Triage Vitals  Enc Vitals Group     BP 06/15/18 2038 115/64     Pulse Rate 06/15/18 2038 91     Resp 06/15/18 2038 17     Temp 06/15/18 2038 98.5 F (36.9 C)     Temp Source 06/15/18 2038 Oral     SpO2 06/15/18 2038 97 %     Weight 06/15/18 2039 180 lb (81.6 kg)     Height 06/15/18 2039 5\' 5"  (1.651 m)     Head Circumference --  Peak Flow --      Pain Score 06/15/18 2039 7     Pain Loc --      Pain Edu? --      Excl. in Minor? --     Constitutional: And oriented x4 nontoxic no diaphoresis Eyes: PERRL EOMI. Head: Multiple small abrasions on her face none requiring repair. Nose: No congestion/rhinnorhea. Mouth/Throat: No trismus Neck: No stridor.   Cardiovascular: Normal rate, regular rhythm. Grossly normal heart sounds.  Good peripheral circulation. Respiratory: Normal respiratory effort.  No retractions. Lungs CTAB and moving good air Gastrointestinal: Soft nontender Musculoskeletal: No tenderness over distal radius or distal ulna. No tenderness over snuffbox and no axial load discomfort Sensation intact to light touch over first dorsal webspace, distal index finger, distal small finger Can flex and oppose  thumb, cross 2 on 3, and extend wrist 2+ radial pulse and less than 2 second capillary refill Skin is closed Compartments are soft    Neurologic:  Normal speech and language. No gross focal neurologic deficits are appreciated. Skin:  Skin is warm, dry and intact. No rash noted. Psychiatric: Mood and affect are normal. Speech and behavior are normal.    ____________________________________________   DIFFERENTIAL includes but not limited to  Intracerebral hemorrhage, wrist fracture,  elbow fracture, sprain ____________________________________________   LABS (all labs ordered are listed, but only abnormal results are displayed)  Labs Reviewed - No data to display   __________________________________________  EKG   ____________________________________________  RADIOLOGY  X-rays reviewed by me with no acute disease ____________________________________________   PROCEDURES  Procedure(s) performed: o  Procedures  Critical Care performed: no  ____________________________________________   INITIAL IMPRESSION / ASSESSMENT AND PLAN / ED COURSE  Pertinent labs & imaging results that were available during my care of the patient were reviewed by me and considered in my medical decision making (see chart for details).   The patient has a clear mechanical fall.  She feels improved after Vicodin and Toradol.  X-rays are negative.  Tetanus up-to-date.  Placed in a sling with improvement in her symptoms.  Referred to primary care as an outpatient.      ____________________________________________   FINAL CLINICAL IMPRESSION(S) / ED DIAGNOSES  Final diagnoses:  Abrasion  Contusion of right elbow, initial encounter      NEW MEDICATIONS STARTED DURING THIS VISIT:  Discharge Medication List as of 06/15/2018 11:00 PM       Note:  This document was prepared using Dragon voice recognition software and may include unintentional dictation errors.     Darel Hong, MD 06/17/18 [phone]

## 2018-06-20 ENCOUNTER — Encounter: Payer: Self-pay | Admitting: Internal Medicine

## 2018-06-20 DIAGNOSIS — K219 Gastro-esophageal reflux disease without esophagitis: Secondary | ICD-10-CM | POA: Insufficient documentation

## 2018-06-20 NOTE — Assessment & Plan Note (Signed)
Blood pressure under good control.  Continue same medication regimen.  Follow pressures.  Follow metabolic panel.   

## 2018-06-20 NOTE — Assessment & Plan Note (Signed)
Discussed diet and exercise.  Follow.  

## 2018-06-20 NOTE — Assessment & Plan Note (Signed)
On prozac.  Has xanax.  Overall feels things are stable.  Follow.

## 2018-06-20 NOTE — Assessment & Plan Note (Signed)
No swallowing issues now.  On omeprazole now.  Controlled.

## 2018-06-20 NOTE — Assessment & Plan Note (Signed)
Low carb diet and exercise.  Follow met b and a1c.   

## 2018-06-20 NOTE — Assessment & Plan Note (Signed)
On prozac.  Stable.   

## 2018-06-20 NOTE — Assessment & Plan Note (Signed)
On pravastatin.  Low cholesterol diet and exercise.  Follow lipid panel and liver function tests.   

## 2018-06-20 NOTE — Assessment & Plan Note (Signed)
S/p surgery.  Doing better.  Seeing Dr Arnoldo Morale.

## 2018-06-21 ENCOUNTER — Other Ambulatory Visit: Payer: Self-pay | Admitting: Internal Medicine

## 2018-06-27 IMAGING — CT CT L SPINE W/ CM
1 of 6 series · 5 of 14 positions shown, 7 images · non-contrast
Comparison: MRI of the lumbar spine 02/22/2017

CLINICAL DATA: Chronic low back and leg pain. Symptoms are worse
with standing and walking. Extension of the lumbar spine improves
her symptoms.
TECHNIQUE: Contiguous axial images were obtained through the Lumbar spine after
the intrathecal infusion of infusion. Coronal and sagittal
reconstructions were obtained of the axial image sets.

[Series 2: l spine soft · axial · 0.27mm/px · z∈[-312,-147]mm · 5 of 83 slices shown, 7 images]
[im 14/83  soft-tissue]
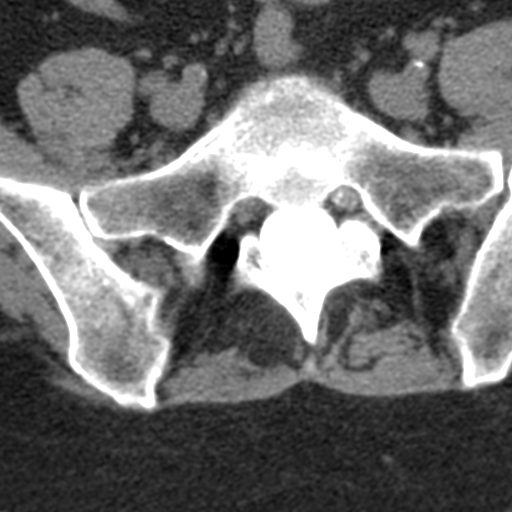
[im 14/83  bone]
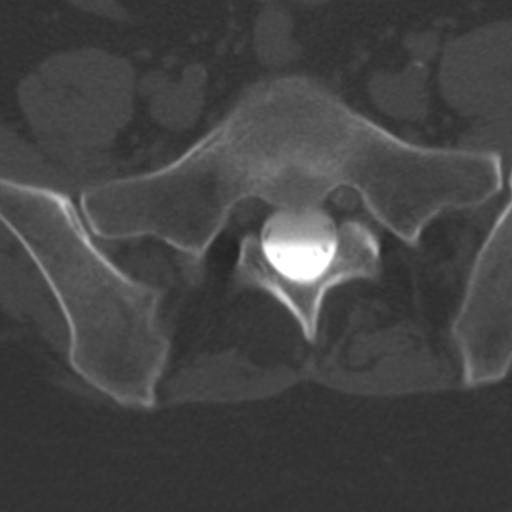
[im 28/83  bone]
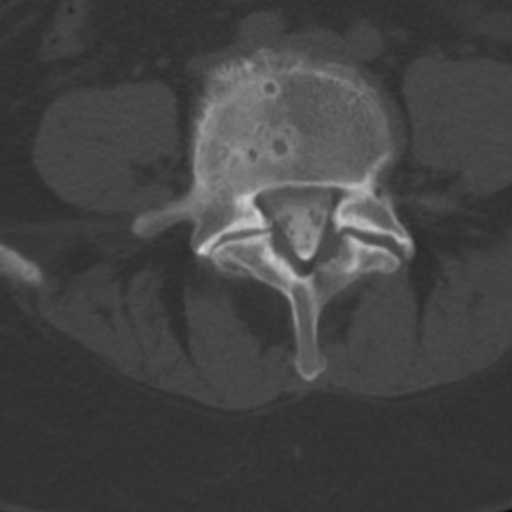
[im 42/83  bone]
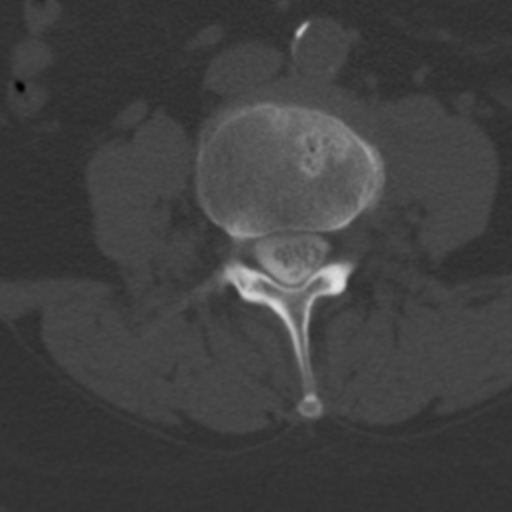
[im 55/83  bone]
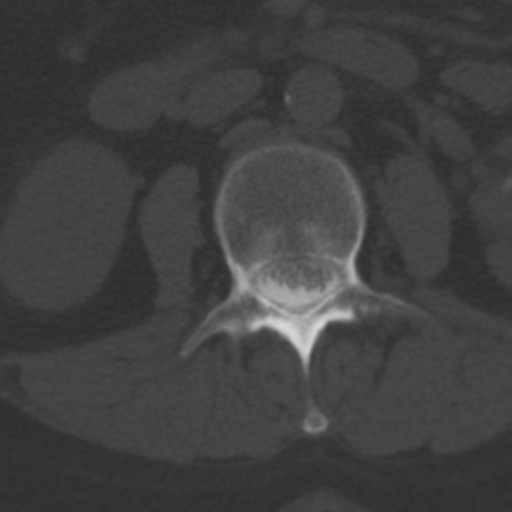
[im 69/83  soft-tissue]
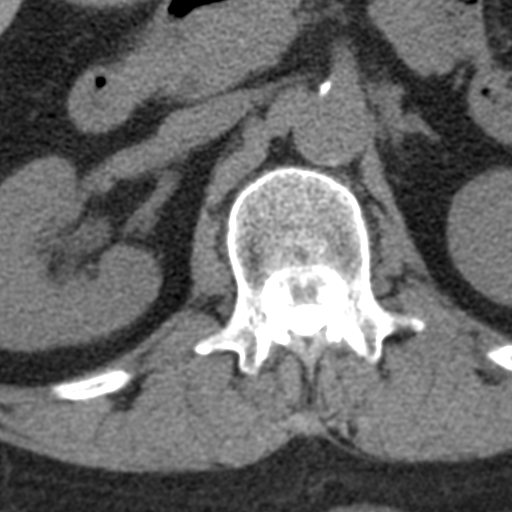
[im 69/83  bone]
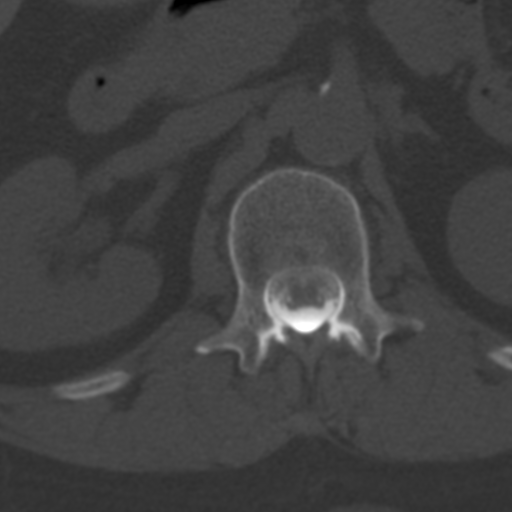

[5 of 14 positions shown; findings below may reference images not displayed]

EXAM:
LUMBAR MYELOGRAM

FLUOROSCOPY TIME:  Radiation Exposure Index (as provided by the
fluoroscopic device): 235.40 uGy*m2

Fluoroscopy Time:  30 seconds

Number of Acquired Images:  16

PROCEDURE:
After thorough discussion of risks and benefits of the procedure
including bleeding, infection, injury to nerves, blood vessels,
adjacent structures as well as headache and CSF leak, written and
oral informed consent was obtained. Consent was obtained by Dr.
Noam Delatorre. Time out form was completed.

Patient was positioned prone on the fluoroscopy table. Local
anesthesia was provided with 1% lidocaine without epinephrine after
prepped and draped in the usual sterile fashion. Puncture was
performed at L2-3 using a 3 1/2 inch 22-gauge spinal needle via
right paramedian approach. Using a single pass through the dura, the
needle was placed within the thecal sac, with return of clear CSF.
15 mL of Isovue M 200 was injected into the thecal sac, with normal
opacification of the nerve roots and cauda equina consistent with
free flow within the subarachnoid space.

I personally performed the lumbar puncture and administered the
intrathecal contrast. I also personally supervised acquisition of
the myelogram images.
FINDINGS: LUMBAR MYELOGRAM FINDINGS:

Five non rib-bearing lumbar type vertebral bodies are present.
Moderate central canal stenosis is present at L3-4 secondary to a
broad-based disc protrusion. Subarticular stenosis is present
bilaterally. Asymmetric right-sided disc disease is present at L4-5
with mild right subarticular stenosis. There is early truncation of
the right L4 nerve root. The left L4, L5, and S1 nerve roots are
normal in opacified. There is early truncation of the L3 nerve roots
bilaterally.

Slight retrolisthesis is present at L3-4. AP alignment is otherwise
anatomic.

Upright images demonstrate exacerbation of the L3-4 disc protrusion.
There is no abnormal motion during flexion extension. Disc
protrusions are similar with flexion and extension.

CT LUMBAR MYELOGRAM FINDINGS:

The lumbar spine is imaged from T11-12 through S3-4. 3 mm
retrolisthesis and right lateral listhesis is present at L3-4. Focal
leftward curvature is present at L4-5. There is asymmetric endplate
sclerosis and subchondral cysts on the right at L4-5.

Cholecystectomy is noted. Atherosclerotic calcifications are noted
at the distal aorta and proximal iliac vessels without aneurysm.
Degenerate changes are noted at the SI joints bilaterally. No
significant adenopathy is present. The

L1-2: Moderate facet hypertrophy is present bilaterally. There is no
focal disc protrusion or stenosis.

L2-3: Moderate facet hypertrophy is present bilaterally. No focal
disc protrusion or stenosis is evident.

L3-4: A broad-based disc protrusion is asymmetric to the right.
Moderate facet hypertrophy is noted bilaterally. A vacuum disc is
present. Moderate subarticular and foraminal stenosis is worse right
than left.

L4-5: A broad-based disc protrusion is present. Moderate facet
hypertrophy is noted bilaterally. Mild subarticular narrowing is
worse on the right. Moderate to severe foraminal narrowing is worse
on the right.

L5-S1: Moderate facet hypertrophy is present bilaterally. There is
no focal disc protrusion or stenosis.
IMPRESSION: 1. Multilevel facet hypertrophy throughout the lumbar spine.
2. 3 mm retrolisthesis and right lateral listhesis at L3-4 with a
vacuum disc and broad-based disc protrusion resulting and moderate
subarticular and foraminal narrowing bilaterally, right greater
left. Findings are similar to the prior study.
3. Mild subarticular narrowing and moderate to severe foraminal
stenosis bilaterally at L4-5 is worse on the right.
4. No significant disc protrusion or focal stenosis at L1-2, L2-3,
or L5-S1.
5. Distal aortic atherosclerosis.

## 2018-07-04 IMAGING — CR DG NECK SOFT TISSUE
2 series · 2 of 2 positions shown · non-contrast
Comparison: None.

CLINICAL DATA: Pharyngitis.

EXAM:
NECK SOFT TISSUES - 1+ VIEW

[neck lat]
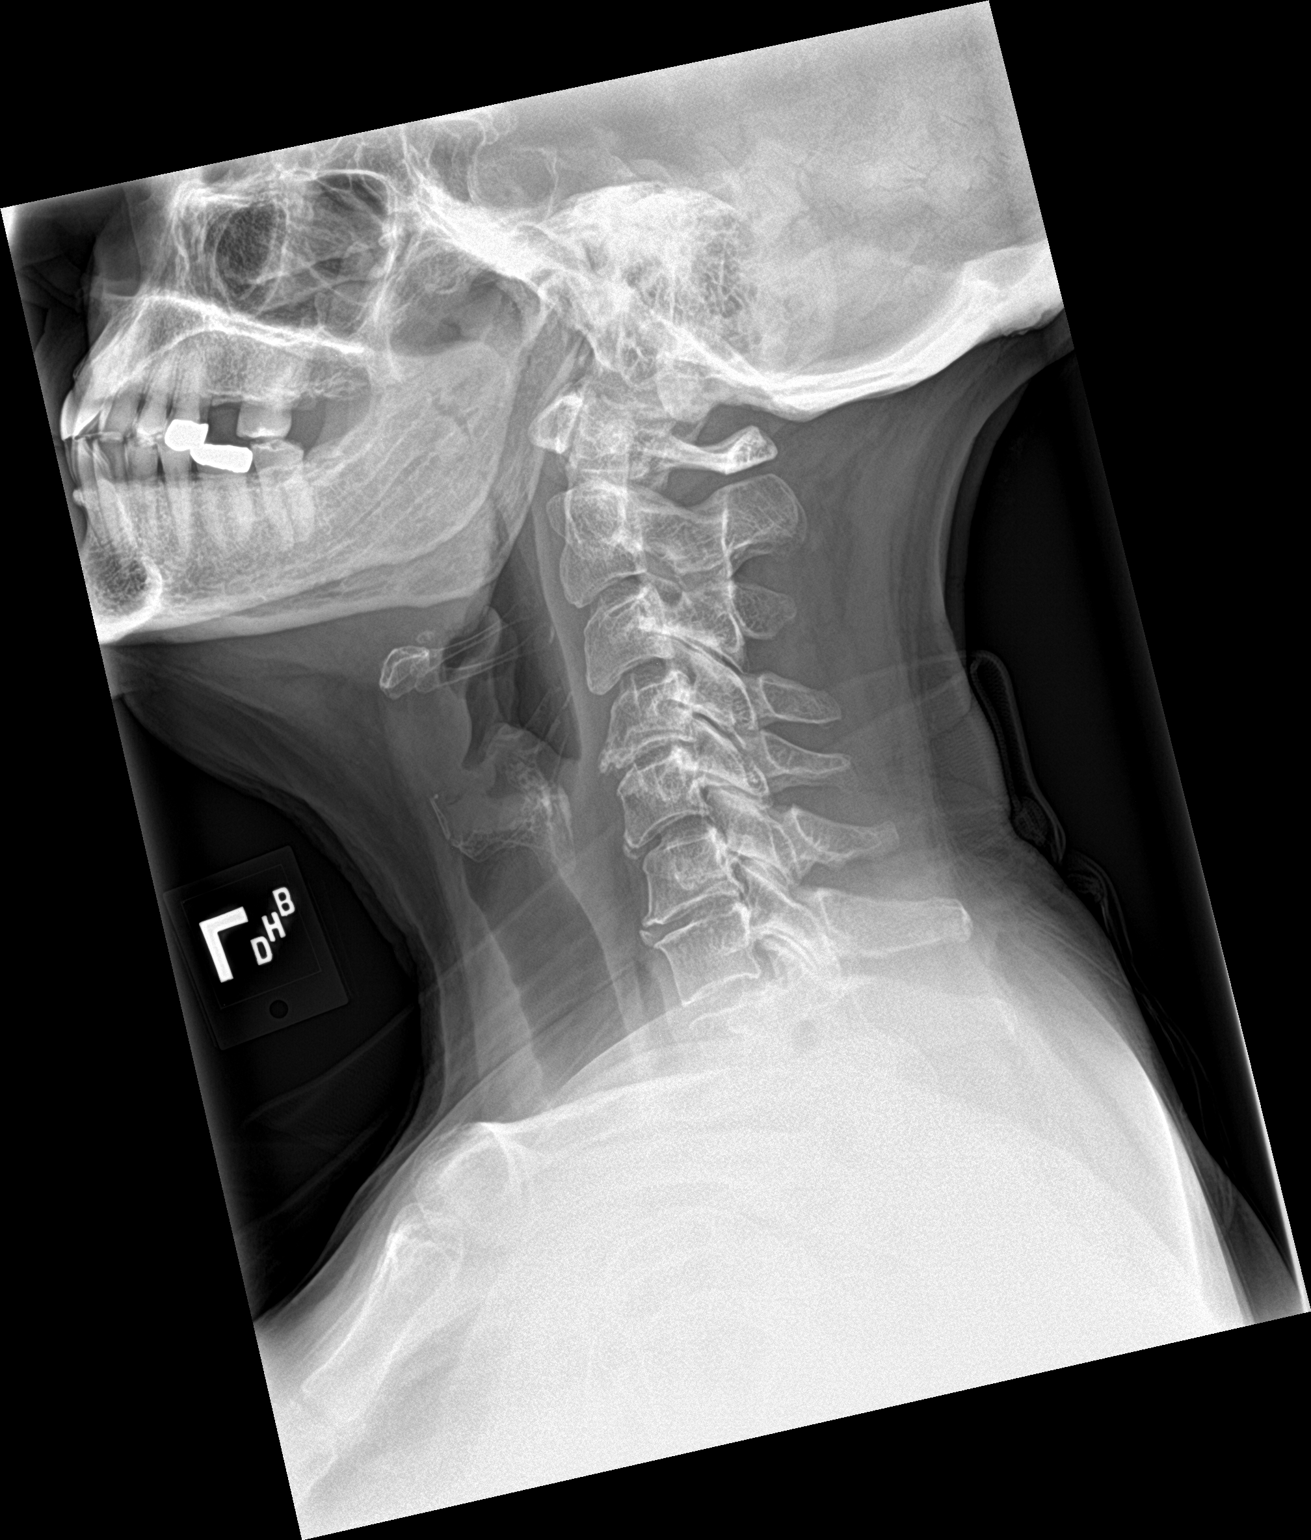

[neck ap]
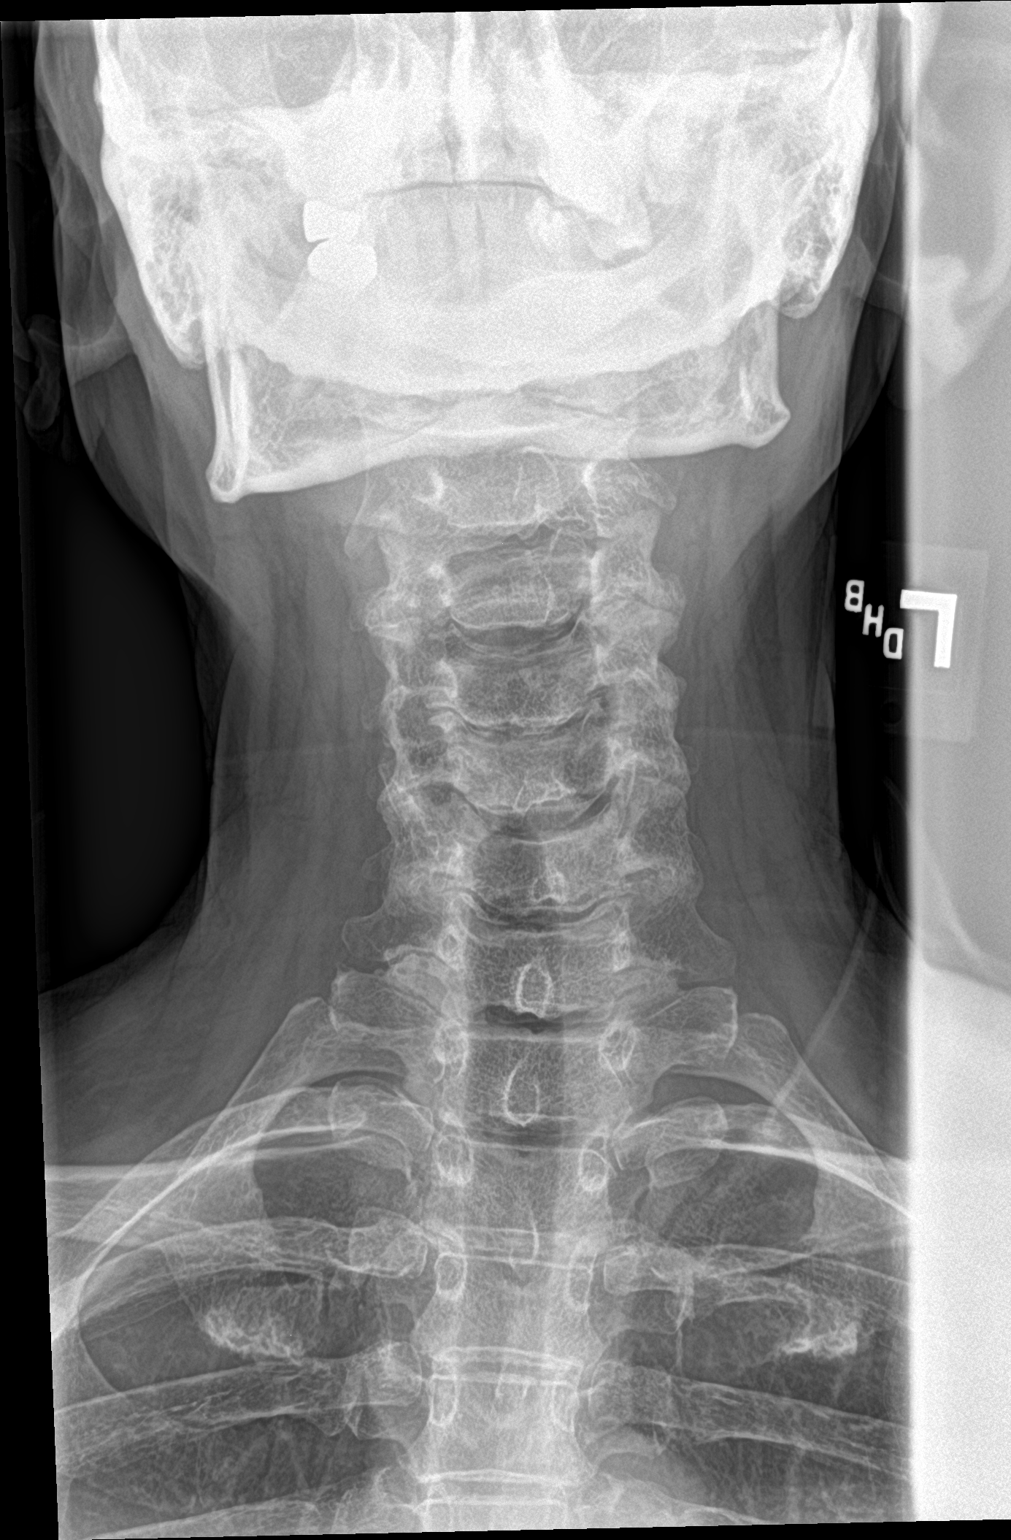

[2 of 2 positions shown; findings below may reference images not displayed]

FINDINGS: Multilevel cervical spine degenerative changes and straightening of
the normal lordosis. Normal thickness prevertebral soft tissues with
no soft tissue gas seen.
IMPRESSION: 1. No evidence of retropharyngeal abscess.
2. Cervical spine degenerative changes and straightening of the
normal lordosis.

## 2018-07-05 ENCOUNTER — Other Ambulatory Visit: Payer: Self-pay

## 2018-07-05 ENCOUNTER — Telehealth: Payer: Self-pay | Admitting: Internal Medicine

## 2018-07-05 DIAGNOSIS — Z1239 Encounter for other screening for malignant neoplasm of breast: Secondary | ICD-10-CM

## 2018-07-05 NOTE — Telephone Encounter (Signed)
-----   Message from Lars Masson, LPN sent at 4/88/3014  8:42 AM EDT ----- Regarding: RE: schedule mammogram Patient is agreeable to mammogram. I gave her the phone number to [facility] and I have placed the order. ----- Message ----- From: Einar Pheasant, MD Sent: 07/03/2018   3:05 AM To: Lars Masson, LPN Subject: schedule mammogram                             In reviewing, pt has not had mammogram since 2017 - per our records.  Needs to be scheduled.  See if she is agreeable to schedule.  If so, can order.  Let me know either way.    Dr Nicki Reaper

## 2018-07-11 ENCOUNTER — Emergency Department: Payer: BLUE CROSS/BLUE SHIELD

## 2018-07-11 ENCOUNTER — Encounter: Payer: Self-pay | Admitting: Emergency Medicine

## 2018-07-11 ENCOUNTER — Emergency Department
Admission: EM | Admit: 2018-07-11 | Discharge: 2018-07-11 | Disposition: A | Discharge status: Home / Self Care | Payer: BLUE CROSS/BLUE SHIELD | Attending: Emergency Medicine | Admitting: Emergency Medicine

## 2018-07-11 ENCOUNTER — Other Ambulatory Visit: Payer: Self-pay

## 2018-07-11 DIAGNOSIS — Z87891 Personal history of nicotine dependence: Secondary | ICD-10-CM | POA: Insufficient documentation

## 2018-07-11 DIAGNOSIS — Y998 Other external cause status: Secondary | ICD-10-CM | POA: Insufficient documentation

## 2018-07-11 DIAGNOSIS — Y929 Unspecified place or not applicable: Secondary | ICD-10-CM | POA: Insufficient documentation

## 2018-07-11 DIAGNOSIS — R51 Headache: Secondary | ICD-10-CM | POA: Diagnosis not present

## 2018-07-11 DIAGNOSIS — Y939 Activity, unspecified: Secondary | ICD-10-CM | POA: Diagnosis not present

## 2018-07-11 DIAGNOSIS — S0990XA Unspecified injury of head, initial encounter: Secondary | ICD-10-CM | POA: Diagnosis present

## 2018-07-11 DIAGNOSIS — Z79899 Other long term (current) drug therapy: Secondary | ICD-10-CM | POA: Diagnosis not present

## 2018-07-11 DIAGNOSIS — W19XXXA Unspecified fall, initial encounter: Secondary | ICD-10-CM | POA: Insufficient documentation

## 2018-07-11 DIAGNOSIS — S0003XA Contusion of scalp, initial encounter: Secondary | ICD-10-CM

## 2018-07-11 DIAGNOSIS — G8929 Other chronic pain: Secondary | ICD-10-CM | POA: Diagnosis not present

## 2018-07-11 DIAGNOSIS — M549 Dorsalgia, unspecified: Secondary | ICD-10-CM | POA: Insufficient documentation

## 2018-07-11 DIAGNOSIS — Z7902 Long term (current) use of antithrombotics/antiplatelets: Secondary | ICD-10-CM | POA: Insufficient documentation

## 2018-07-11 DIAGNOSIS — Z85828 Personal history of other malignant neoplasm of skin: Secondary | ICD-10-CM | POA: Diagnosis not present

## 2018-07-11 DIAGNOSIS — I10 Essential (primary) hypertension: Secondary | ICD-10-CM | POA: Insufficient documentation

## 2018-07-11 MED ORDER — LIDOCAINE 5 % EX PTCH
1.0000 | MEDICATED_PATCH | CUTANEOUS | Status: DC
Start: 2018-07-11 — End: 2018-07-11
  Administered 2018-07-11: 1 via TRANSDERMAL
  Filled 2018-07-11: qty 1

## 2018-07-11 MED ORDER — LIDOCAINE 5 % EX PTCH: 1.0000 | MEDICATED_PATCH | CUTANEOUS | 0 refills | Status: DC

## 2018-07-11 NOTE — ED Provider Notes (Signed)
[facility] Emergency Department Provider Note   ____________________________________________   First MD Initiated Contact with Patient 07/11/18 1434     (approximate)  I have reviewed the triage vital signs and the nursing notes.   HISTORY  Chief Complaint Back Pain    HPI Angel Riddle is a 62 y.o. female patient complain of increasing low back pain secondary to a fall few weeks ago.  Patient states that experience pain 8 days after the fall.  Patient denies radicular component to her pain.  Patient denies bladder bowel dysfunction.  Patient has a history of back pain with with posterior interbody fusion of L4 and L5.  Surgery was performed in October 2018.  Patient rates pain 7/10.  Patient described pain is "ache".  No palliative measures for complaint.  Past Medical History:  Diagnosis Date  . Allergy   . Anxiety   . Arthritis   . Cancer (HCC)    basal cell nose  . Chronic back pain    degenerative disc disease  . Degenerative disc disease, lumbar   . Depression   . Dyspnea    with exertion / pain  . Gallstones   . GERD (gastroesophageal reflux disease)   . Heart murmur    followed by PCP  . Hx of colonic polyp   . Hx: UTI (urinary tract infection)   . Hypercholesterolemia   . Hypertension   . Panic attacks    H/O  . Wears dentures    partial upper and lower    Patient Active Problem List   Diagnosis Date Noted  . GERD (gastroesophageal reflux disease) 06/20/2018  . Chest pain 01/06/2018  . Lumbar stenosis with neurogenic claudication 09/15/2017  . BMI 30.0-30.9,adult 04/25/2017  . Hyperglycemia 04/25/2017  . Personal history of tobacco use, presenting hazards to health 12/15/2016  . Nausea without vomiting 03/29/2016  . Personal history of colonic polyps   . Anal fissure   . Benign neoplasm of sigmoid colon   . Left arm pain 08/03/2015  . Cough 07/30/2015  . Urinary hesitancy 07/30/2015  . Health care maintenance  01/12/2015  . Rectal irritation 11/24/2014  . Gallstones 04/01/2014  . Abdominal fullness 03/10/2014  . Numbness and tingling 10/02/2013  . Hypertension 10/02/2013  . Chronic back pain 07/18/2013  . Depression 07/18/2013  . Anxiety 07/18/2013  . Panic attacks 07/18/2013  . Environmental allergies 07/18/2013  . Hypercholesterolemia 07/18/2013  . History of colonic polyps 07/18/2013    Past Surgical History:  Procedure Laterality Date  . ABDOMINAL HYSTERECTOMY  1992   partial  . BACK SURGERY     lumbar fusion  . CATARACT EXTRACTION Bilateral 2010,2011  . CHOLECYSTECTOMY  04-19-14  . COLONOSCOPY  07/02/11   DR. Byrnett  . COLONOSCOPY WITH PROPOFOL N/A 09/25/2015   Procedure: COLONOSCOPY WITH PROPOFOL;  Surgeon: Lucilla Lame, MD;  Location: [facility];  Service: Endoscopy;  Laterality: N/A;  . FOOT SURGERY Right 2009  . MOUTH SURGERY    . POLYPECTOMY  09/25/2015   Procedure: POLYPECTOMY;  Surgeon: Lucilla Lame, MD;  Location: [facility];  Service: Endoscopy;;  . skin surgery Left 08/27/13   Basal cell removal-left nostril  . TONSILLECTOMY AND ADENOIDECTOMY  1969    Prior to Admission medications   Medication Sig Start Date End Date Taking? Authorizing Provider  ALPRAZolam Duanne Moron) 1 MG tablet TAKE (1) TABLET TWICE A DAY. 06/09/18   Einar Pheasant, MD  ezetimibe (ZETIA) 10 MG tablet Take 1 tablet (  10 mg total) by mouth daily. 02/22/18   Minna Merritts, MD  FLUoxetine (PROZAC) 40 MG capsule Take 1 capsule (40 mg total) by mouth daily. 06/23/18   Einar Pheasant, MD  fluticasone (FLONASE) 50 MCG/ACT nasal spray Place 1 spray into both nostrils daily as needed for allergies. Reported on 11/25/2015 12/24/13   [provider]  hydrochlorothiazide (MICROZIDE) 12.5 MG capsule TAKE ONE CAPSULE DAILY. 06/23/18   Einar Pheasant, MD  HYDROcodone-acetaminophen [facility]) 10-325 MG tablet  12/19/17   [provider]  hydrocortisone (ANUSOL-HC) 25 MG suppository  Place 1 suppository (25 mg total) rectally 2 (two) times daily. 11/28/15   Einar Pheasant, MD  lidocaine (LIDODERM) 5 % Place 1 patch onto the skin daily. Remove & Discard patch within 12 hours or as directed by MD 07/11/18 07/11/19  Sable Feil, PA-C  loratadine (CLARITIN) 10 MG tablet Take 10 mg by mouth daily as needed for allergies.    [provider]  meloxicam (MOBIC) 15 MG tablet Take 1 tablet (15 mg total) by mouth daily. 06/09/18   Einar Pheasant, MD  omeprazole (PRILOSEC) 40 MG capsule Take 40 mg by mouth daily.  05/16/18   [provider]  potassium chloride (K-DUR) 10 MEQ tablet Take 1 tablet (10 mEq total) by mouth daily. 01/02/18   Einar Pheasant, MD  potassium chloride (K-DUR,KLOR-CON) 10 MEQ tablet Take 1 tablet (10 mEq total) by mouth daily. 05/11/18   Einar Pheasant, MD  pravastatin (PRAVACHOL) 10 MG tablet Take 1 tablet (10 mg total) by mouth daily. 06/13/18   Einar Pheasant, MD  promethazine (PHENERGAN) 12.5 MG tablet Take 1 tablet (12.5 mg total) by mouth 2 (two) times daily as needed for nausea or vomiting. 04/25/18   Einar Pheasant, MD  tiZANidine (ZANAFLEX) 4 MG tablet Take 2-4 mg by mouth 3 times daily as needed for muscle spasms 02/08/17   [provider]    Allergies Levaquin [levofloxacin in d5w]; Adhesive [tape]; Doxycycline; and Ivp dye [iodinated diagnostic agents]  Family History  Problem Relation Age of Onset  . Hyperlipidemia Mother   . Hypertension Mother   . Alcohol abuse Father   . Hyperlipidemia Father   . Heart disease Father   . Diabetes Father   . Hyperlipidemia Sister   . Lung cancer Brother   . Hyperlipidemia Brother     Social History Social History   Tobacco Use  . Smoking status: Former Smoker    Packs/day: 1.00    Years: 37.00    Pack years: 37.00    Last attempt to quit: 2012    Years since quitting: 7.6  . Smokeless tobacco: Never Used  Substance Use Topics  . Alcohol use: Yes    Alcohol/week: 0.6 oz     Types: 1 Glasses of wine per week  . Drug use: No    Review of Systems  Constitutional: No fever/chills Eyes: No visual changes. ENT: No sore throat. Cardiovascular: Denies chest pain. Respiratory: Denies shortness of breath. Gastrointestinal: No abdominal pain.  No nausea, no vomiting.  No diarrhea.  No constipation. Genitourinary: Negative for dysuria. Musculoskeletal: Negative for back pain. Skin: Negative for rash. Neurological: Negative for headaches, focal weakness or numbness. Psychiatric:Anxiety depression Endocrine:.  Hyperlipidemia and hypertension. Allergic/Immunilogical: See medication list. ____________________________________________   PHYSICAL EXAM:  VITAL SIGNS: ED Triage Vitals  Enc Vitals Group     BP 07/11/18 1423 129/81     Pulse Rate 07/11/18 1423 91     Resp 07/11/18 1423 16  Temp 07/11/18 1423 98.5 F (36.9 C)     Temp Source 07/11/18 1423 Oral     SpO2 07/11/18 1423 97 %     Weight 07/11/18 1425 180 lb (81.6 kg)     Height 07/11/18 1425 5\' 5"  (1.651 m)     Head Circumference --      Peak Flow --      Pain Score 07/11/18 1424 7     Pain Loc --      Pain Edu? --      Excl. in [place]? --    Constitutional: Alert and oriented. Well appearing and in no acute distress. Eyes: Conjunctivae are normal. PERRL. EOMI. Head: Atraumatic. Nose: No congestion/rhinnorhea. Mouth/Throat: Mucous membranes are moist.  Oropharynx non-erythematous. Neck: No stridor. Hematological/Lymphatic/Immunilogical: No cervical lymphadenopathy. Cardiovascular: Normal rate, regular rhythm. Grossly normal heart sounds.  Good peripheral circulation. Respiratory: Normal respiratory effort.  No retractions. Lungs CTAB. Gastrointestinal: Soft and nontender. No distention. No abdominal bruits. No CVA tenderness. Musculoskeletal: No obvious spinal deformity.  No lower extremity tenderness nor edema.  No joint effusions. Neurologic:  Normal speech and language. No gross focal  neurologic deficits are appreciated. No gait instability. Skin:  Skin is warm, dry and intact. No rash noted.  Surgical scar consistent with history. Psychiatric: Mood and affect are normal. Speech and behavior are normal.  ____________________________________________   LABS (all labs ordered are listed, but only abnormal results are displayed)  Labs Reviewed - No data to display ____________________________________________  EKG   ____________________________________________  RADIOLOGY  ED MD interpretation:    Official radiology report(s): Dg Lumbar Spine Complete  Result Date: 07/11/2018 CLINICAL DATA:  The patient underwent back surgery in October 2018. Patient fell 1 week ago and has had increased pain since. EXAM: LUMBAR SPINE - COMPLETE 4+ VIEW COMPARISON:  Lumbar spine radiographs of January 24, 2018 FINDINGS: The lumbar vertebral bodies are preserved in height. The patient has undergone posterior fusion and interbody fusion from L3 through L5. The metallic hardware appears intact and unchanged in positioning. The L1-2 and L2-3 disc spaces are reasonably well maintained as is the L5-S1 disc space. There is moderate facet joint hypertrophy at L2-3 and at L5-S1. The L1 and L2 pedicles are intact as are the transverse processes. The observed portions of the sacrum are normal. There is a stable calcification projecting over the midpole of the left kidney. There are numerous pelvic phleboliths. IMPRESSION: There is no acute bony abnormality of the lumbar spine. The metallic fusion hardware from previous PLIF from L3 through L5 appears intact. Electronically Signed   By: David  Martinique M.D.   On: 07/11/2018 15:14   Ct Head Wo Contrast  Result Date: 07/11/2018 CLINICAL DATA:  Posttraumatic headache EXAM: CT HEAD WITHOUT CONTRAST TECHNIQUE: Contiguous axial images were obtained from the base of the skull through the vertex without intravenous contrast. COMPARISON:  04/30/2010 brain MRI  FINDINGS: Brain: No evidence of acute infarction, hemorrhage, hydrocephalus, extra-axial collection or mass lesion/mass effect. Expanded, partially empty sella also noted in 2011, incidental in isolation. Vascular: No hyperdense vessel or unexpected calcification. Skull: Negative for fracture Sinuses/Orbits: No evidence of injury IMPRESSION: No evidence of intracranial injury. Electronically Signed   By: Monte Fantasia M.D.   On: 07/11/2018 15:26    ____________________________________________   PROCEDURES  Procedure(s) performed: None  Procedures  Critical Care performed: No  ____________________________________________   INITIAL IMPRESSION / ASSESSMENT AND PLAN / ED COURSE  As part of my medical decision making, I reviewed  the following data within the electronic MEDICAL RECORD NUMBER    Headache secondary to contusion.  Patient will have back pain secondary to fall.  Discussed discussed no acute findings on CT of the head and x-ray of the lumbar spine.  Patient given discharge care instruction.  Patient continue to take her pain medication.  Patient given a trial of Lidoderm patch to increase pain control.  Patient advised follow-up with scheduled neurosurgical appointment.      ____________________________________________   FINAL CLINICAL IMPRESSION(S) / ED DIAGNOSES  Final diagnoses:  Contusion of scalp, initial encounter  Chronic midline back pain, unspecified back location     ED Discharge Orders        Ordered    lidocaine (LIDODERM) 5 %  Every 24 hours     07/11/18 1537       Note:  This document was prepared using Dragon voice recognition software and may include unintentional dictation errors.    Sable Feil, PA-C 07/11/18 1550    Darel Hong, MD 07/11/18 [phone]

## 2018-07-11 NOTE — ED Notes (Signed)
Pt c/o lower back pain that radiates up into neck which is causing her a headache - [place] a [place] into her - she fell into a glass panel beside the glass door and fell head first through the glass panel - pt denies loss of consciousness but reports headaches daily since - she had back surgery October 2018 and since the fall is having severe back pain - she has neurosurgery appt on the 27th but does not feel like she can wait

## 2018-07-11 NOTE — Discharge Instructions (Addendum)
Continue previous medication and try the Lidoderm patch for better pain control.  Follow-up with schedule neurosurgery appointment.

## 2018-07-11 NOTE — ED Triage Notes (Signed)
Says fell few weeks ago.  Says her back did not hurt at the time, but after the 8th day, she has had back pain.  Also has headache that comes and goes since.  She has made appt with her neurosurgeon (who did her back surgerin last fall) but really had bad back pain today.

## 2018-07-18 ENCOUNTER — Ambulatory Visit: Payer: BLUE CROSS/BLUE SHIELD

## 2018-07-24 ENCOUNTER — Ambulatory Visit
Admission: RE | Admit: 2018-07-24 | Discharge: 2018-07-24 | Disposition: A | Discharge status: Home / Self Care | Payer: BLUE CROSS/BLUE SHIELD | Source: Ambulatory Visit | Attending: Internal Medicine | Admitting: Internal Medicine

## 2018-07-24 DIAGNOSIS — Z1231 Encounter for screening mammogram for malignant neoplasm of breast: Secondary | ICD-10-CM | POA: Diagnosis not present

## 2018-07-24 DIAGNOSIS — Z1239 Encounter for other screening for malignant neoplasm of breast: Secondary | ICD-10-CM

## 2018-07-25 ENCOUNTER — Other Ambulatory Visit: Payer: Self-pay | Admitting: Internal Medicine

## 2018-07-25 NOTE — Telephone Encounter (Signed)
Last OV 06/13/2018 Next OV 10/18/2018 Last refill 06/09/2018  Patient does not have enough to wait until Dr. Nicki Reaper comes back to refill

## 2018-08-11 ENCOUNTER — Other Ambulatory Visit: Payer: Self-pay | Admitting: Internal Medicine

## 2018-08-14 NOTE — Telephone Encounter (Signed)
Okay to refill? 

## 2018-08-14 NOTE — Telephone Encounter (Signed)
ok'd refill for meloxicam #30 with one refill.

## 2018-08-21 IMAGING — CR DG LUMBAR SPINE 1V
1 series · 1 of 1 positions shown · non-contrast
Comparison: CT scan of July 22, 2017.

CLINICAL DATA: L3 through L5 posterior fusion.

EXAM:
LUMBAR SPINE - 1 VIEW

[lateral]
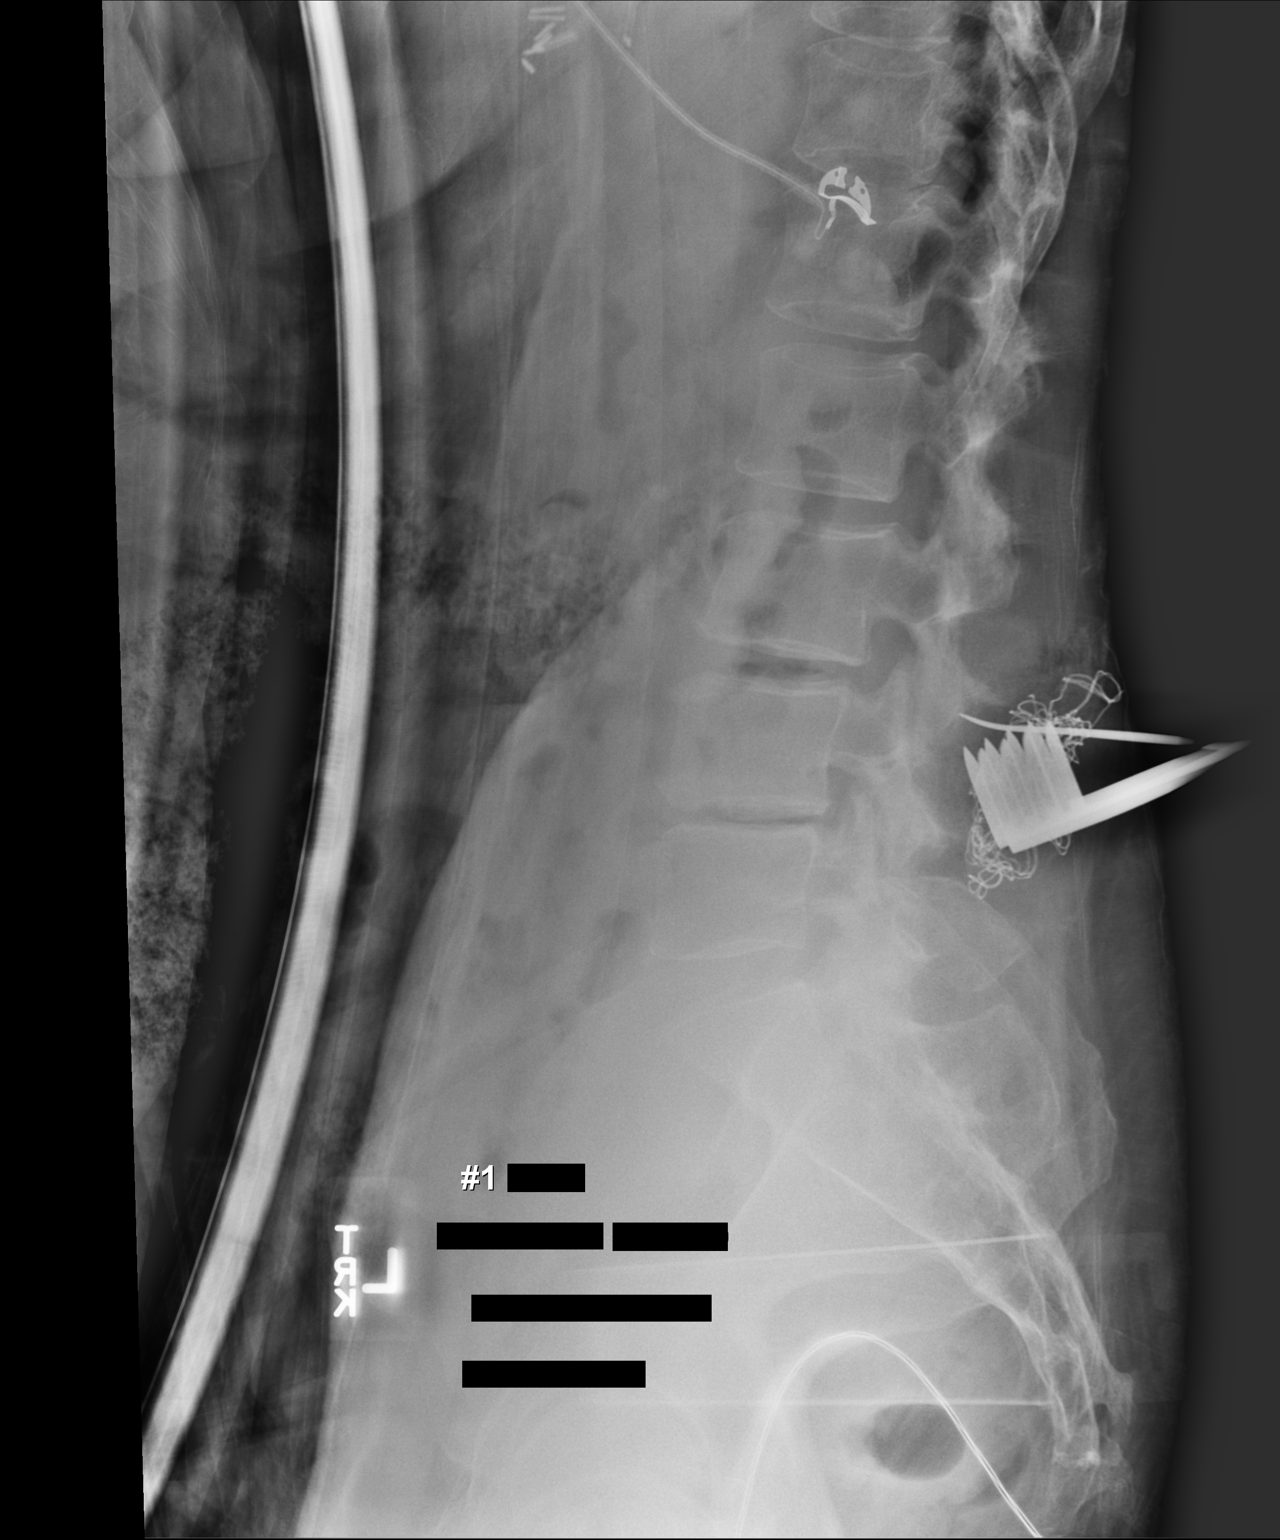

[1 of 1 positions shown; findings below may reference images not displayed]

FINDINGS: Single intraoperative cross-table lateral projection of the lumbar
spine demonstrates a surgical probe in the posterior interspinous
space of L3-4. Surgical retractor is seen posterior to L4.
IMPRESSION: Surgical localization as described above.

## 2018-08-21 IMAGING — RF DG C-ARM 61-120 MIN
1 series · 2 of 2 positions shown · non-contrast
Comparison: Lumbar spine 09/15/2017.

CLINICAL DATA: Posterior fusion.

EXAM:
DG C-ARM 61-120 MIN; LUMBAR SPINE - 2-3 VIEW

[Series 1: run · 2 of 2 slices shown]
[im 1/2]
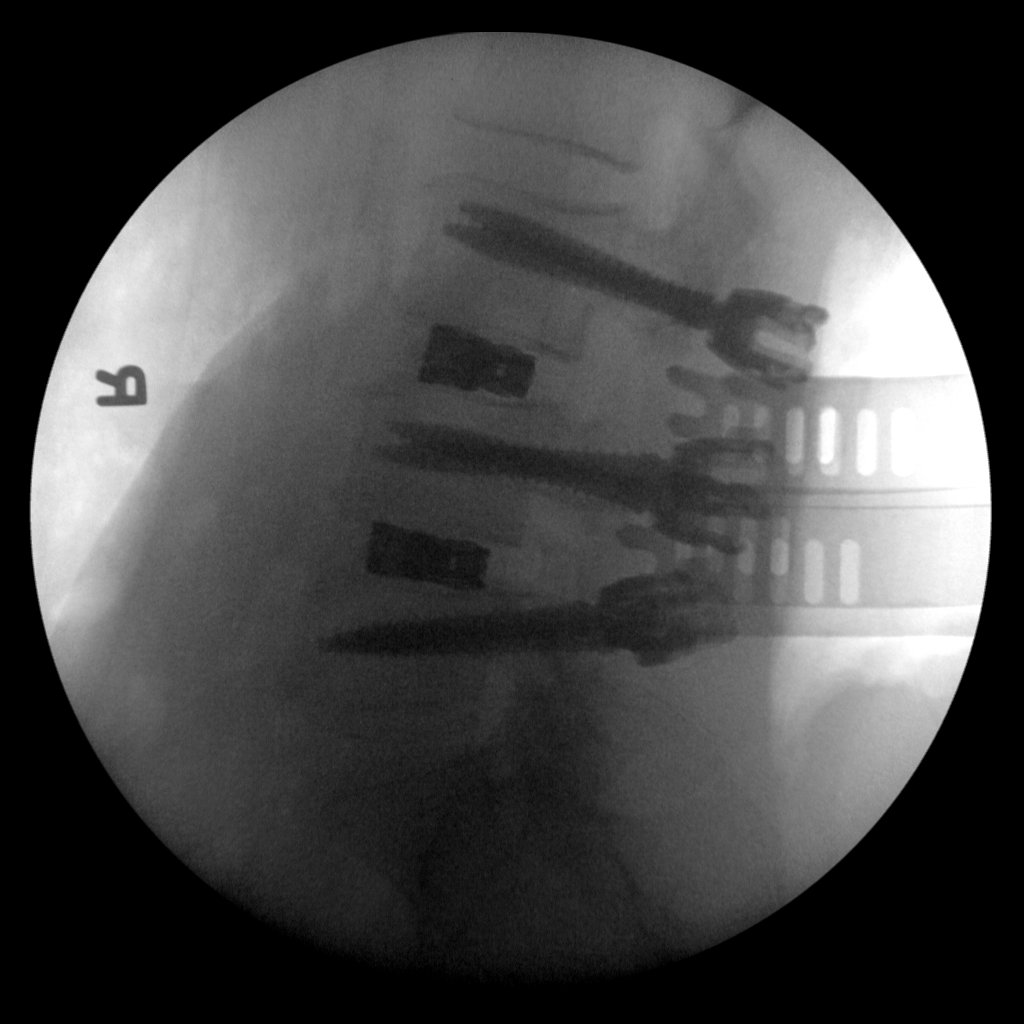
[im 2/2]
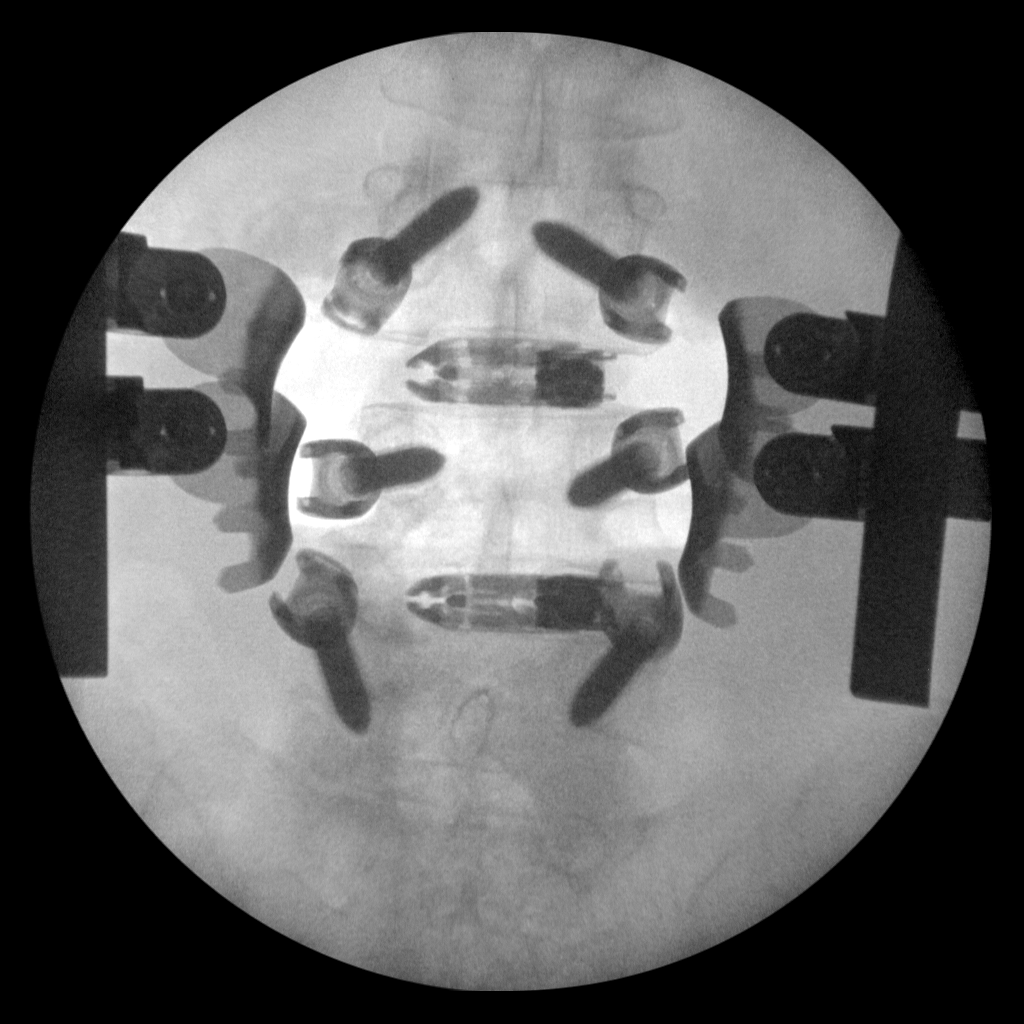

[2 of 2 positions shown; findings below may reference images not displayed]

FINDINGS: Lumbar spine numbered as per prior CT of 07/22/2017 . L3-L4 and
L4-L5 posterior interbody fusion. Hardware intact. Anatomic
alignment. No acute bony abnormality.
IMPRESSION: Postsurgical changes lumbar spine.

## 2018-09-07 ENCOUNTER — Other Ambulatory Visit: Payer: Self-pay | Admitting: Internal Medicine

## 2018-09-08 NOTE — Telephone Encounter (Signed)
Okay to refill? Last Rx was sent in for #30 with no refills

## 2018-09-13 ENCOUNTER — Other Ambulatory Visit: Payer: Self-pay | Admitting: Internal Medicine

## 2018-09-14 NOTE — Telephone Encounter (Signed)
Refilled: 07/25/2018 Last OV: 06/13/2018 Next OV: 10/18/2018

## 2018-09-14 NOTE — Telephone Encounter (Signed)
Patient calling to check the status of this refill. States that she is having to stay still and laying down, per her eye doctor, due to an issue with her eye hemmoraging. States that she is supposed to be going out of town on Sunday 09/17/18 and would like to have this medication before she leaves because she will run out on Monday. [place], [place] - [place] CB#: [phone]

## 2018-10-17 ENCOUNTER — Other Ambulatory Visit (INDEPENDENT_AMBULATORY_CARE_PROVIDER_SITE_OTHER): Payer: BLUE CROSS/BLUE SHIELD

## 2018-10-17 ENCOUNTER — Telehealth: Payer: Self-pay | Admitting: *Deleted

## 2018-10-17 DIAGNOSIS — Z789 Other specified health status: Secondary | ICD-10-CM

## 2018-10-17 DIAGNOSIS — E538 Deficiency of other specified B group vitamins: Secondary | ICD-10-CM

## 2018-10-17 DIAGNOSIS — E7800 Pure hypercholesterolemia, unspecified: Secondary | ICD-10-CM

## 2018-10-17 DIAGNOSIS — R739 Hyperglycemia, unspecified: Secondary | ICD-10-CM

## 2018-10-17 DIAGNOSIS — Z1159 Encounter for screening for other viral diseases: Secondary | ICD-10-CM

## 2018-10-17 DIAGNOSIS — I10 Essential (primary) hypertension: Secondary | ICD-10-CM

## 2018-10-17 LAB — HEPATIC FUNCTION PANEL
ALBUMIN: 4.5 g/dL (ref 3.5–5.2)
ALK PHOS: 95 U/L (ref 39–117)
ALT: 13 U/L (ref 0–35)
AST: 26 U/L (ref 0–37)
Bilirubin, Direct: 0.1 mg/dL (ref 0.0–0.3)
TOTAL PROTEIN: 7.2 g/dL (ref 6.0–8.3)
Total Bilirubin: 0.6 mg/dL (ref 0.2–1.2)

## 2018-10-17 LAB — LIPID PANEL
CHOL/HDL RATIO: 5
CHOLESTEROL: 251 mg/dL — AB (ref 0–200)
HDL: 49.7 mg/dL (ref >39.00)
NonHDL: 200.94
TRIGLYCERIDES: 292 mg/dL — AB (ref 0.0–149.0)
VLDL: 58.4 mg/dL — ABNORMAL HIGH (ref 0.0–40.0)

## 2018-10-17 LAB — BASIC METABOLIC PANEL
BUN: 17 mg/dL (ref 6–23)
CALCIUM: 9.8 mg/dL (ref 8.4–10.5)
CO2: 28 meq/L (ref 19–32)
Chloride: 100 mEq/L (ref 96–112)
Creatinine, Ser: 1 mg/dL (ref 0.40–1.20)
GFR: 59.58 mL/min — ABNORMAL LOW (ref >60.00)
Glucose, Bld: 98 mg/dL (ref 70–99)
POTASSIUM: 4 meq/L (ref 3.5–5.1)
SODIUM: 138 meq/L (ref 135–145)

## 2018-10-17 LAB — HEMOGLOBIN A1C: Hgb A1c MFr Bld: 5.9 % (ref 4.6–6.5)

## 2018-10-17 LAB — LDL CHOLESTEROL, DIRECT: Direct LDL: 166 mg/dL

## 2018-10-17 NOTE — Telephone Encounter (Signed)
Order placed for B12.  Add to lab orders from today.   Thanks

## 2018-10-17 NOTE — Telephone Encounter (Signed)
Pt cam e in for labs today & requested a B12 shot. Pt also mentioned that she has a [employer] data [employer]. Her last B12 inj was in July. She thought it was in September. I told her you would let her know tomorrow at visit & that you may want to check her B12 levels first with labs collected today.

## 2018-10-17 NOTE — Telephone Encounter (Signed)
Add on sheet faxed 

## 2018-10-18 ENCOUNTER — Ambulatory Visit (INDEPENDENT_AMBULATORY_CARE_PROVIDER_SITE_OTHER): Payer: BLUE CROSS/BLUE SHIELD | Admitting: Internal Medicine

## 2018-10-18 ENCOUNTER — Encounter: Payer: Self-pay | Admitting: Internal Medicine

## 2018-10-18 DIAGNOSIS — E538 Deficiency of other specified B group vitamins: Secondary | ICD-10-CM

## 2018-10-18 DIAGNOSIS — Z6831 Body mass index (BMI) 31.0-31.9, adult: Secondary | ICD-10-CM | POA: Diagnosis not present

## 2018-10-18 DIAGNOSIS — M549 Dorsalgia, unspecified: Secondary | ICD-10-CM

## 2018-10-18 DIAGNOSIS — R739 Hyperglycemia, unspecified: Secondary | ICD-10-CM

## 2018-10-18 DIAGNOSIS — Z9109 Other allergy status, other than to drugs and biological substances: Secondary | ICD-10-CM

## 2018-10-18 DIAGNOSIS — K219 Gastro-esophageal reflux disease without esophagitis: Secondary | ICD-10-CM

## 2018-10-18 DIAGNOSIS — J3489 Other specified disorders of nose and nasal sinuses: Secondary | ICD-10-CM

## 2018-10-18 DIAGNOSIS — Z0000 Encounter for general adult medical examination without abnormal findings: Secondary | ICD-10-CM

## 2018-10-18 DIAGNOSIS — G8929 Other chronic pain: Secondary | ICD-10-CM

## 2018-10-18 DIAGNOSIS — E7800 Pure hypercholesterolemia, unspecified: Secondary | ICD-10-CM

## 2018-10-18 DIAGNOSIS — I10 Essential (primary) hypertension: Secondary | ICD-10-CM

## 2018-10-18 DIAGNOSIS — F419 Anxiety disorder, unspecified: Secondary | ICD-10-CM | POA: Diagnosis not present

## 2018-10-18 DIAGNOSIS — F32A Depression, unspecified: Secondary | ICD-10-CM

## 2018-10-18 DIAGNOSIS — F329 Major depressive disorder, single episode, unspecified: Secondary | ICD-10-CM

## 2018-10-18 LAB — HEPATITIS C ANTIBODY
HEP C AB: NONREACTIVE
SIGNAL TO CUT-OFF: 0.14 (ref <1.00)

## 2018-10-18 LAB — VARICELLA ZOSTER ANTIBODY, IGG: VARICELLA IGG: 1980 {index}

## 2018-10-18 LAB — VITAMIN B12: VITAMIN B 12: 221 pg/mL (ref 211–911)

## 2018-10-18 MED ORDER — CYANOCOBALAMIN 1000 MCG/ML IJ SOLN
1000.0000 ug | Freq: Once | INTRAMUSCULAR | Status: AC
  Administered 2018-10-18: 1000 ug via INTRAMUSCULAR

## 2018-10-18 NOTE — Progress Notes (Signed)
Patient ID: Angel Riddle, female   DOB: 02/13/1956, 62 y.o.   MRN: [id number]   Subjective:    Patient ID: Angel Riddle, female    DOB: [birth year], 62 y.o.   MRN: [id number]  HPI  Patient here for her physical exam.  Has a history of back pain.  S/p surgery 09/2017.  Had fall recently.  Seen in ER secondary to increased low back pain. No acute findings on CT head and back xray.  Given lidoderm patch.  Saw neurosurgery.  On hydrocodone.  Changed to 7.38m.  Continue f/u with neurosurgery.  Tries to stay active. Has not been as active since the fall.  No chest pain.  No sob.  No acid reflux.  No abdominal pain.  Bowels moving.  No urine change.  Increased stress.  Discussed at length with her today.  Reports previous suicidal thoughts.  None currently and has not had any in a while.  She has good support.  States she would never hurt herself or anyone else.  Overall feels she is handling stress relatively well.  Her persistent nasal lesion.  request to see Dr IKellie Moor      Past Medical History:  Diagnosis Date  . Allergy   . Anxiety   . Arthritis   . Cancer (HCC)    basal cell nose  . Chronic back pain    degenerative disc disease  . Degenerative disc disease, lumbar   . Depression   . Dyspnea    with exertion / pain  . Gallstones   . GERD (gastroesophageal reflux disease)   . Heart murmur    followed by PCP  . Hx of colonic polyp   . Hx: UTI (urinary tract infection)   . Hypercholesterolemia   . Hypertension   . Panic attacks    H/O  . Wears dentures    partial upper and lower   Past Surgical History:  Procedure Laterality Date  . ABDOMINAL HYSTERECTOMY  1992   partial  . BACK SURGERY     lumbar fusion  . CATARACT EXTRACTION Bilateral 2010,2011  . CHOLECYSTECTOMY  04-19-14  . COLONOSCOPY  07/02/11   DR. Byrnett  . COLONOSCOPY WITH PROPOFOL N/A 09/25/2015   Procedure: COLONOSCOPY WITH PROPOFOL;  Surgeon: DLucilla Lame MD;  [facility]  Service:  Endoscopy;  Laterality: N/A;  . FOOT SURGERY Right 2009  . MOUTH SURGERY    . POLYPECTOMY  09/25/2015   Procedure: POLYPECTOMY;  Surgeon: DLucilla Lame MD;  Location[facility]  Service: Endoscopy;;  . skin surgery Left 08/27/13   Basal cell removal-left nostril  . TONSILLECTOMY AND ADENOIDECTOMY  1969   Family History  Problem Relation Age of Onset  . Hyperlipidemia Mother   . Hypertension Mother   . Alcohol abuse Father   . Hyperlipidemia Father   . Heart disease Father   . Diabetes Father   . Hyperlipidemia Sister   . Lung cancer Brother   . Hyperlipidemia Brother   . Breast cancer Neg Hx    Social History   Socioeconomic History  . Marital status: Married    Spouse name: Not on file  . Number of children: 2  . Years of education: Not on file  . Highest education level: Not on file  Occupational History  . Not on file  Social Needs  . Financial resource strain: Not on file  . Food insecurity:    Worry: Not on file    Inability: Not on  file  . Transportation needs:    Medical: Not on file    Non-medical: Not on file  Tobacco Use  . Smoking status: Former Smoker    Packs/day: 1.00    Years: 37.00    Pack years: 37.00    Last attempt to quit: 2012    Years since quitting: 7.8  . Smokeless tobacco: Never Used  Substance and Sexual Activity  . Alcohol use: Yes    Alcohol/week: 1.0 standard drinks    Types: 1 Glasses of wine per week  . Drug use: No  . Sexual activity: Not on file  Lifestyle  . Physical activity:    Days per week: Not on file    Minutes per session: Not on file  . Stress: Not on file  Relationships  . Social connections:    Talks on phone: Not on file    Gets together: Not on file    Attends religious service: Not on file    Active member of club or organization: Not on file    Attends meetings of clubs or organizations: Not on file    Relationship status: Not on file  Other Topics Concern  . Not on file  Social History  Narrative  . Not on file    Outpatient Encounter Medications as of 10/18/2018  Medication Sig  . ezetimibe (ZETIA) 10 MG tablet Take 1 tablet (10 mg total) by mouth daily.  Marland Kitchen FLUoxetine (PROZAC) 40 MG capsule Take 1 capsule (40 mg total) by mouth daily.  . fluticasone (FLONASE) 50 MCG/ACT nasal spray Place 1 spray into both nostrils daily as needed for allergies. Reported on 11/25/2015  . hydrochlorothiazide (MICROZIDE) 12.5 MG capsule TAKE ONE CAPSULE DAILY.  Marland Kitchen HYDROcodone-acetaminophen (NORCO) 7.5-325 MG tablet   . hydrocortisone (ANUSOL-HC) 25 MG suppository Place 1 suppository (25 mg total) rectally 2 (two) times daily.  Marland Kitchen loratadine (CLARITIN) 10 MG tablet Take 10 mg by mouth daily as needed for allergies.  . meloxicam (MOBIC) 15 MG tablet Take 1 tablet (15 mg total) by mouth daily.  Marland Kitchen omeprazole (PRILOSEC) 40 MG capsule Take 40 mg by mouth daily.   . potassium chloride (K-DUR) 10 MEQ tablet Take 1 tablet (10 mEq total) by mouth daily.  . potassium chloride (K-DUR,KLOR-CON) 10 MEQ tablet Take 1 tablet (10 mEq total) by mouth daily.  . pravastatin (PRAVACHOL) 10 MG tablet Take 1 tablet (10 mg total) by mouth daily.  . promethazine (PHENERGAN) 12.5 MG tablet Take 1 tablet (12.5 mg total) by mouth 2 (two) times daily as needed for nausea or vomiting.  Marland Kitchen tiZANidine (ZANAFLEX) 4 MG tablet Take 2-4 mg by mouth 3 times daily as needed for muscle spasms  . [DISCONTINUED] ALPRAZolam (XANAX) 1 MG tablet TAKE (1) TABLET TWICE A DAY.  . [DISCONTINUED] HYDROcodone-acetaminophen (NORCO) 10-325 MG tablet   . [DISCONTINUED] lidocaine (LIDODERM) 5 % Place 1 patch onto the skin daily. Remove & Discard patch within 12 hours or as directed by MD  . [EXPIRED] cyanocobalamin ((VITAMIN B-12)) injection 1,000 mcg    No facility-administered encounter medications on file as of 10/18/2018.     Review of Systems  Constitutional: Negative for appetite change and unexpected weight change.  HENT: Negative for  congestion and sinus pressure.   Eyes: Negative for pain and visual disturbance.  Respiratory: Negative for cough, chest tightness and shortness of breath.   Cardiovascular: Negative for chest pain, palpitations and leg swelling.  Gastrointestinal: Negative for abdominal pain, diarrhea, nausea and vomiting.  Genitourinary: Negative for difficulty urinating and dysuria.  Musculoskeletal: Positive for back pain. Negative for joint swelling.  Skin: Negative for color change and rash.  Neurological: Negative for dizziness, light-headedness and headaches.  Hematological: Negative for adenopathy. Does not bruise/bleed easily.  Psychiatric/Behavioral: Negative for agitation and dysphoric mood.       Increased stress as outlined.         Objective:    Physical Exam  Constitutional: She is oriented to person, place, and time. She appears well-developed and well-nourished.  HENT:  Nose: Nose normal.  Mouth/Throat: Oropharynx is clear and moist.  Eyes: Right eye exhibits no discharge. Left eye exhibits no discharge. No scleral icterus.  Neck: Neck supple. No thyromegaly present.  Cardiovascular: Normal rate and regular rhythm.  Pulmonary/Chest: Breath sounds normal. No accessory muscle usage. No tachypnea. No respiratory distress. She has no decreased breath sounds. She has no wheezes. She has no rhonchi. Right breast exhibits no inverted nipple, no mass, no nipple discharge and no tenderness (no axillary adenopathy). Left breast exhibits no inverted nipple, no mass, no nipple discharge and no tenderness (no axilarry adenopathy).  Abdominal: Soft. Bowel sounds are normal. There is no tenderness.  Musculoskeletal: She exhibits no edema or tenderness.  Lymphadenopathy:    She has no cervical adenopathy.  Neurological: She is alert and oriented to person, place, and time.  Skin: Skin is warm. No rash noted.  Psychiatric: She has a normal mood and affect. Her behavior is normal.    BP 128/80 (BP  Location: Left Arm, Patient Position: Sitting, Cuff Size: Normal)   Pulse (!) 102   Temp 98.1 F (36.7 C) (Oral)   Resp 18   Ht '5\' 5"'  (1.651 m)   Wt 189 lb (85.7 kg)   SpO2 98%   BMI 31.45 kg/m  Wt Readings from Last 3 Encounters:  10/18/18 189 lb (85.7 kg)  07/11/18 180 lb (81.6 kg)  06/15/18 180 lb (81.6 kg)     Lab Results  Component Value Date   WBC 7.3 12/27/2017   HGB 13.1 12/27/2017   HCT 39.1 12/27/2017   PLT 332.0 12/27/2017   GLUCOSE 98 10/17/2018   CHOL 251 (H) 10/17/2018   TRIG 292.0 (H) 10/17/2018   HDL 49.70 10/17/2018   LDLDIRECT 166.0 10/17/2018   LDLCALC 146 (H) 06/05/2018   ALT 13 10/17/2018   AST 26 10/17/2018   NA 138 10/17/2018   K 4.0 10/17/2018   CL 100 10/17/2018   CREATININE 1.00 10/17/2018   BUN 17 10/17/2018   CO2 28 10/17/2018   TSH 0.81 12/27/2017   HGBA1C 5.9 10/17/2018    Mm 3d Screen Breast Bilateral  Result Date: 07/25/2018 CLINICAL DATA:  Screening. EXAM: DIGITAL SCREENING BILATERAL MAMMOGRAM WITH TOMO AND CAD COMPARISON:  Previous exam(s). ACR Breast Density Category b: There are scattered areas of fibroglandular density. FINDINGS: There are no findings suspicious for malignancy. Images were processed with CAD. IMPRESSION: No mammographic evidence of malignancy. A result letter of this screening mammogram will be mailed directly to the patient. RECOMMENDATION: Screening mammogram in one year. (Code:SM-B-01Y) BI-RADS CATEGORY  1: Negative. Electronically Signed   By: Marin Olp M.D.   On: 07/25/2018 08:08       Assessment & Plan:   Problem List Items Addressed This Visit    Anxiety    On prozac.  Takes xanax.  Discussed with her today.  Doing better.  Feels better.  No suicidal ideations.  Has good support.  Follow.  Desires no further intervention.        BMI 31.0-31.9,adult    Discussed diet and exercise.  Follow.        Chronic back pain    S/p surgery.  Was doing better.  Recent fall.  Reevaluated.  Sees Dr Arnoldo Morale.   On hydrocodone.  Follow.        Relevant Medications   HYDROcodone-acetaminophen (NORCO) 7.5-325 MG tablet   Depression    On prozac.  Discussed with her today.  No current suicidal ideations.  States she would never hurt herself.  Has good support.  Discussed psychiatry referral and/or counseling.  She declines.  Follow.        Environmental allergies    Stable on current regimen.  Follow.        GERD (gastroesophageal reflux disease)    Controlled on current regimen.  Follow.        Health care maintenance    Physical today 10/18/18.  Mammogram 07/25/18 - Birads I.  Colonoscopy 09/2015.  Recommended f/u in 5 years.        Hypercholesterolemia    On pravastatin.  Low cholesterol diet and exercise.  Follow lipid panel and liver function tests.        Relevant Orders   Hepatic function panel   Lipid panel   Hyperglycemia    Low carb diet and exercise.  Follow met b and a1c.        Relevant Orders   Hemoglobin A1c   Hypertension    Blood pressure under good control.  Continue same medication regimen.  Follow pressures.  Follow metabolic panel.        Relevant Orders   CBC with Differential/Platelet   TSH   Basic metabolic panel    Other Visit Diagnoses    B12 deficiency       Relevant Medications   cyanocobalamin ((VITAMIN B-12)) injection 1,000 mcg (Completed)   Nasal lesion       Persistent left external nasal lesion.  Request referral to dermatology.     Relevant Orders   Ambulatory referral to Dermatology       Einar Pheasant, MD

## 2018-10-20 ENCOUNTER — Telehealth: Payer: Self-pay | Admitting: Internal Medicine

## 2018-10-20 ENCOUNTER — Telehealth: Payer: Self-pay

## 2018-10-20 MED ORDER — ALPRAZOLAM 1 MG PO TABS: ORAL_TABLET | ORAL | 0 refills | Status: DC

## 2018-10-20 NOTE — Telephone Encounter (Signed)
rx sent in for alprazolam #60 with no refills.

## 2018-10-20 NOTE — Telephone Encounter (Signed)
Copied from [place] [phone]. Topic: Quick Communication - See Telephone Encounter >> Oct 20, 2018 10:16 AM Conception Chancy, NT wrote: CRM for notification. See Telephone encounter for: 10/20/18.  Patient is calling and states she was seen on 10/18/18 and ALPRAZolam (XANAX) 1 MG tablet  was not called in. She would like this refilled.  [place], [place] - [address] [place] [address] Phone: [phone] Fax: [phone]

## 2018-10-20 NOTE — Telephone Encounter (Signed)
Please advise 

## 2018-10-20 NOTE — Telephone Encounter (Signed)
msg sent to provider 

## 2018-10-20 NOTE — Telephone Encounter (Signed)
Copied from [place] [phone]. Topic: Appointment Scheduling - New Patient >> Oct 20, 2018 10:17 AM Conception Chancy, NT wrote: Patient would like to know if Dr. Nicki Reaper could accept her husband as a new patient since that is her pcp.   Route to department's PEC pool.

## 2018-10-20 NOTE — Telephone Encounter (Signed)
Copied from [place] [phone]. Topic: Appointment Scheduling - New Patient >> Oct 20, 2018 10:17 AM Conception Chancy, NT wrote: Patient would like to know if Dr. Nicki Reaper could accept her husband as a new patient since that is her pcp.   Route to department's PEC pool.

## 2018-10-21 ENCOUNTER — Encounter: Payer: Self-pay | Admitting: Internal Medicine

## 2018-10-21 NOTE — Assessment & Plan Note (Signed)
S/p surgery.  Was doing better.  Recent fall.  Reevaluated.  Sees Dr Arnoldo Morale.  On hydrocodone.  Follow.

## 2018-10-21 NOTE — Assessment & Plan Note (Signed)
Controlled on current regimen.  Follow.  

## 2018-10-21 NOTE — Assessment & Plan Note (Signed)
Stable on current regimen.  Follow.   

## 2018-10-21 NOTE — Assessment & Plan Note (Signed)
On prozac.  Takes xanax.  Discussed with her today.  Doing better.  Feels better.  No suicidal ideations.  Has good support.  Follow.  Desires no further intervention.

## 2018-10-21 NOTE — Assessment & Plan Note (Signed)
On prozac.  Discussed with her today.  No current suicidal ideations.  States she would never hurt herself.  Has good support.  Discussed psychiatry referral and/or counseling.  She declines.  Follow.

## 2018-10-21 NOTE — Assessment & Plan Note (Signed)
Low carb diet and exercise.  Follow met b and a1c.   

## 2018-10-21 NOTE — Assessment & Plan Note (Signed)
Discussed diet and exercise.  Follow.  

## 2018-10-21 NOTE — Assessment & Plan Note (Signed)
Blood pressure under good control.  Continue same medication regimen.  Follow pressures.  Follow metabolic panel.   

## 2018-10-21 NOTE — Assessment & Plan Note (Signed)
On pravastatin.  Low cholesterol diet and exercise.  Follow lipid panel and liver function tests.   

## 2018-10-21 NOTE — Assessment & Plan Note (Signed)
Physical today 10/18/18.  Mammogram 07/25/18 - Birads I.  Colonoscopy 09/2015.  Recommended f/u in 5 years.

## 2018-10-24 ENCOUNTER — Telehealth: Payer: Self-pay

## 2018-10-24 NOTE — Telephone Encounter (Signed)
Copied from [place] [phone]. Topic: General - Other >> Oct 24, 2018 11:50 AM Keene Breath wrote: Reason for CRM: Patient called to ask Dr. Nicki Reaper if she could accept her husband as a new patient.  Patient stated that she has been seeing Dr. Nicki Reaper for years and she really would like her husband to see her as well.  Please advise and let patient know if this is possible.  CB [phone]

## 2018-10-24 NOTE — Telephone Encounter (Signed)
Message sent to provider 

## 2018-10-25 ENCOUNTER — Ambulatory Visit (INDEPENDENT_AMBULATORY_CARE_PROVIDER_SITE_OTHER): Payer: BLUE CROSS/BLUE SHIELD

## 2018-10-25 DIAGNOSIS — E538 Deficiency of other specified B group vitamins: Secondary | ICD-10-CM

## 2018-10-25 MED ORDER — CYANOCOBALAMIN 1000 MCG/ML IJ SOLN
1000.0000 ug | Freq: Once | INTRAMUSCULAR | Status: AC
  Administered 2018-10-25: 1000 ug via INTRAMUSCULAR

## 2018-10-25 NOTE — Progress Notes (Signed)
Patient comes in for B 12 injection.  Injected right deltoid.  Patient tolerated injection well.   

## 2018-10-29 NOTE — Progress Notes (Signed)
  I have reviewed the above information and agree with above.   Candee Hoon, MD 

## 2018-11-01 ENCOUNTER — Ambulatory Visit: Payer: BLUE CROSS/BLUE SHIELD

## 2018-11-07 ENCOUNTER — Other Ambulatory Visit: Payer: Self-pay | Admitting: Internal Medicine

## 2018-11-08 ENCOUNTER — Ambulatory Visit (INDEPENDENT_AMBULATORY_CARE_PROVIDER_SITE_OTHER): Payer: BLUE CROSS/BLUE SHIELD | Admitting: *Deleted

## 2018-11-08 DIAGNOSIS — E538 Deficiency of other specified B group vitamins: Secondary | ICD-10-CM

## 2018-11-08 MED ORDER — CYANOCOBALAMIN 1000 MCG/ML IJ SOLN
1000.0000 ug | Freq: Once | INTRAMUSCULAR | Status: AC
  Administered 2018-11-08: 1000 ug via INTRAMUSCULAR

## 2018-11-08 NOTE — Progress Notes (Signed)
Patient presented for B 12 injection to right deltoid, patient voiced no concerns nor showed any signs of distress during injection. 

## 2018-11-14 ENCOUNTER — Telehealth: Payer: Self-pay | Admitting: Internal Medicine

## 2018-11-14 NOTE — Telephone Encounter (Signed)
Please advise 

## 2018-11-14 NOTE — Telephone Encounter (Signed)
See me before you get patient for B12 injection

## 2018-11-14 NOTE — Telephone Encounter (Signed)
Copied from [place] [phone]. Topic: General - Other >> Nov 14, 2018  4:12 PM Cecelia Byars, NT wrote: Reason for CRM: Patient called and says she really needs to speak with Dr Nicki Reaper herself as soon as possible and to please call her at  [phone]

## 2018-11-15 ENCOUNTER — Ambulatory Visit (INDEPENDENT_AMBULATORY_CARE_PROVIDER_SITE_OTHER): Payer: BLUE CROSS/BLUE SHIELD | Admitting: *Deleted

## 2018-11-15 DIAGNOSIS — E538 Deficiency of other specified B group vitamins: Secondary | ICD-10-CM

## 2018-11-15 MED ORDER — CYANOCOBALAMIN 1000 MCG/ML IJ SOLN
1000.0000 ug | Freq: Once | INTRAMUSCULAR | Status: AC
  Administered 2018-11-15: 1000 ug via INTRAMUSCULAR

## 2018-11-15 NOTE — Progress Notes (Signed)
Patient presented for B 12 injection to left deltoid, patient voiced no concerns nor showed any signs of distress during injection. 

## 2018-12-04 ENCOUNTER — Other Ambulatory Visit: Payer: Self-pay | Admitting: Internal Medicine

## 2018-12-04 NOTE — Telephone Encounter (Signed)
Last refill 10/20/18 last ov 11/19?

## 2018-12-07 ENCOUNTER — Other Ambulatory Visit: Payer: Self-pay | Admitting: Internal Medicine

## 2018-12-16 ENCOUNTER — Telehealth: Payer: Self-pay

## 2018-12-16 NOTE — Telephone Encounter (Signed)
Call pt regarding lung screening. Left message for pt to return call.  

## 2018-12-22 ENCOUNTER — Telehealth: Payer: Self-pay

## 2018-12-22 NOTE — Telephone Encounter (Signed)
Call pt regarding lung screening. Per pt will call back later to set up any appointment. Pt spouse is sick at this time.

## 2019-01-04 ENCOUNTER — Telehealth: Payer: Self-pay

## 2019-01-04 ENCOUNTER — Other Ambulatory Visit: Payer: Self-pay | Admitting: Internal Medicine

## 2019-01-04 DIAGNOSIS — F32A Depression, unspecified: Secondary | ICD-10-CM

## 2019-01-04 DIAGNOSIS — F329 Major depressive disorder, single episode, unspecified: Secondary | ICD-10-CM

## 2019-01-04 NOTE — Telephone Encounter (Signed)
Copied from [place] [phone]. Topic: General - Inquiry >> Jan 04, 2019  1:57 PM Angel Riddle wrote: Reason for CRM: pt asking if she has a referral placed for Dr. Nicolasa Ducking with psychiatry in [place]. She said her and Dr. Nicki Reaper have discussed before but she isn't sure if appt has been scheduled. Please call back at [phone].

## 2019-01-04 NOTE — Telephone Encounter (Signed)
Left message for patient to return my call.

## 2019-01-04 NOTE — Telephone Encounter (Signed)
She had wanted to wait on referral last visit.  I do not mind placing the order for the referral, but please confirm that she is doing ok.

## 2019-01-04 NOTE — Telephone Encounter (Signed)
Nothing new just wanted to go head because she rethought everything and thought she should go ahead. She is hoping to see the psychiatrist her husband is seeing, or find one will see husband and wife. If not she prefers Dr.Kapur.

## 2019-01-04 NOTE — Telephone Encounter (Signed)
Order placed for psychiatry referral to Dr Nicolasa Ducking.

## 2019-01-04 NOTE — Telephone Encounter (Signed)
Pt requesting referral to Dr Nicolasa Ducking. Is this something that was discussed at last visit?

## 2019-01-04 NOTE — Progress Notes (Signed)
Order placed for psychiatry referral.

## 2019-01-08 ENCOUNTER — Telehealth: Payer: Self-pay | Admitting: *Deleted

## 2019-01-08 ENCOUNTER — Other Ambulatory Visit: Payer: Self-pay | Admitting: Internal Medicine

## 2019-01-08 NOTE — Telephone Encounter (Signed)
Patient contacted regarding scheduling of lung screening scan. Patient reports that she and her husband have a lot going on and request to plan for lung scan in April 2020. Will follow.

## 2019-01-10 ENCOUNTER — Other Ambulatory Visit: Payer: Self-pay | Admitting: Internal Medicine

## 2019-01-12 ENCOUNTER — Other Ambulatory Visit: Payer: Self-pay | Admitting: Internal Medicine

## 2019-01-12 NOTE — Telephone Encounter (Signed)
Copied from [place] [phone]. Topic: Quick Communication - Rx Refill/Question >> Jan 12, 2019  3:39 PM Ahmed Prima L wrote: Medication: FLUoxetine (PROZAC) 40 MG capsule & pravastatin (PRAVACHOL) 10 MG tablet  Has the patient contacted their pharmacy? Yes (Agent: If no, request that the patient contact the pharmacy for the refill.) (Agent: If yes, when and what did the pharmacy advise?)  Preferred Pharmacy (with phone number or street name): [facility] [place] [address]    Agent: Please be advised that RX refills may take up to 3 business days. We ask that you follow-up with your pharmacy.

## 2019-01-12 NOTE — Telephone Encounter (Signed)
Requested medication (s) are due for refill today: Yes  Requested medication (s) are on the active medication list: Yes  Last refill:  11/08/18 for both  Future visit scheduled: Yes  Notes to clinic:  Failed items on protocol, unable to refill.     Requested Prescriptions  Pending Prescriptions Disp Refills   FLUoxetine (PROZAC) 40 MG capsule 30 capsule 0     Psychiatry:  Antidepressants - SSRI Failed - 01/12/2019  3:44 PM      Failed - Completed PHQ-2 or PHQ-9 in the last 360 days.      Passed - Valid encounter within last 6 months    Recent Outpatient Visits          2 months ago B12 deficiency   [facility] [facility], [place], MD   7 months ago Vitamin B 12 deficiency   [place] [facility], Rand[place] Patient, MD   8 months ago Post-nasal drip   [facility] Burnard Hawthorne, FNP   1 year ago Chest pain, unspecified type   [facility], MD   1 year ago Need for tetanus booster   [facility], Randell Patient, MD      Future Appointments            In 3 weeks Einar Pheasant, MD [place], [facility]          pravastatin (PRAVACHOL) 10 MG tablet 30 tablet 0    Sig: Take 1 tablet (10 mg total) by mouth daily.     Cardiovascular:  Antilipid - Statins Failed - 01/12/2019  3:44 PM      Failed - Total Cholesterol in normal range and within 360 days    Cholesterol  Date Value Ref Range Status  10/17/2018 251 (H) 0 - 200 mg/dL Final    Comment:    ATP III Classification       Desirable:  < 200 mg/dL               Borderline High:  200 - 239 mg/dL          High:  > = 240 mg/dL         Failed - LDL in normal range and within 360 days    LDL Cholesterol  Date Value Ref Range Status  06/05/2018 146 (H) 0 - 99 mg/dL Final         Failed - Triglycerides in normal range and within 360 days    Triglycerides  Date Value Ref Range Status  10/17/2018 292.0 (H)  0.0 - 149.0 mg/dL Final    Comment:    Normal:  <150 mg/dLBorderline High:  150 - 199 mg/dL         Passed - HDL in normal range and within 360 days    HDL  Date Value Ref Range Status  10/17/2018 49.70 >39.00 mg/dL Final         Passed - Patient is not pregnant      Passed - Valid encounter within last 12 months    Recent Outpatient Visits          2 months ago B12 deficiency   [place], MD   7 months ago Vitamin B 12 deficiency   [place], MD   8 months ago Post-nasal drip   [facility] Burnard Hawthorne, [place]   1 year ago Chest pain,  unspecified type   [facility], MD   1 year ago Need for tetanus booster   [facility], MD      Future Appointments            In 3 weeks Einar Pheasant, MD [place], [place]

## 2019-01-13 ENCOUNTER — Other Ambulatory Visit: Payer: Self-pay | Admitting: Internal Medicine

## 2019-01-16 ENCOUNTER — Other Ambulatory Visit: Payer: Self-pay | Admitting: Internal Medicine

## 2019-01-27 ENCOUNTER — Telehealth: Payer: Self-pay | Admitting: *Deleted

## 2019-01-27 DIAGNOSIS — Z122 Encounter for screening for malignant neoplasm of respiratory organs: Secondary | ICD-10-CM

## 2019-01-27 NOTE — Telephone Encounter (Signed)
Patient has been notified that the annual lung cancer screening low dose CT scan is due currently or will be in the near future.  Confirmed that the patient is within the age range of 64-80, and asymptomatic, and currently exhibits no signs or symptoms of lung cancer.  Patient denies illness that would prevent curative treatment for lung cancer if found.  Verified smoking history, former smoker quit 2012 [id number].  The shared decision making visit was completed on 12-24-16.  Patient is agreeable for the CT scan to be scheduled.  Will call patient back with date and time of appointment.

## 2019-01-29 ENCOUNTER — Telehealth: Payer: Self-pay | Admitting: *Deleted

## 2019-01-29 NOTE — Telephone Encounter (Signed)
Called pt to inform her of her appt for ldct screening on Wednesday 01/31/19 @ 11:40am here @ [facility].voiced understanding

## 2019-01-31 ENCOUNTER — Encounter: Payer: Self-pay | Admitting: *Deleted

## 2019-01-31 ENCOUNTER — Ambulatory Visit
Admission: RE | Admit: 2019-01-31 | Discharge: 2019-01-31 | Disposition: A | Discharge status: Home / Self Care | Payer: BLUE CROSS/BLUE SHIELD | Source: Ambulatory Visit | Attending: Nurse Practitioner | Admitting: Nurse Practitioner

## 2019-01-31 DIAGNOSIS — Z122 Encounter for screening for malignant neoplasm of respiratory organs: Secondary | ICD-10-CM | POA: Insufficient documentation

## 2019-02-02 ENCOUNTER — Encounter: Payer: Self-pay | Admitting: *Deleted

## 2019-02-02 ENCOUNTER — Other Ambulatory Visit: Payer: Self-pay | Admitting: Cardiovascular Disease

## 2019-02-02 ENCOUNTER — Other Ambulatory Visit: Payer: Self-pay | Admitting: Internal Medicine

## 2019-02-07 ENCOUNTER — Other Ambulatory Visit (INDEPENDENT_AMBULATORY_CARE_PROVIDER_SITE_OTHER): Payer: BLUE CROSS/BLUE SHIELD

## 2019-02-07 DIAGNOSIS — I10 Essential (primary) hypertension: Secondary | ICD-10-CM

## 2019-02-07 DIAGNOSIS — E7800 Pure hypercholesterolemia, unspecified: Secondary | ICD-10-CM | POA: Diagnosis not present

## 2019-02-07 DIAGNOSIS — R739 Hyperglycemia, unspecified: Secondary | ICD-10-CM

## 2019-02-07 LAB — CBC WITH DIFFERENTIAL/PLATELET
Basophils Absolute: 0.1 10*3/uL (ref 0.0–0.1)
Basophils Relative: 0.7 % (ref 0.0–3.0)
Eosinophils Absolute: 0.1 10*3/uL (ref 0.0–0.7)
Eosinophils Relative: 1.2 % (ref 0.0–5.0)
HCT: 39.2 % (ref 36.0–46.0)
Hemoglobin: 13.4 g/dL (ref 12.0–15.0)
Lymphocytes Relative: 32 % (ref 12.0–46.0)
Lymphs Abs: 2.2 10*3/uL (ref 0.7–4.0)
MCHC: 34.3 g/dL (ref 30.0–36.0)
MCV: 85.4 fl (ref 78.0–100.0)
MONO ABS: 0.5 10*3/uL (ref 0.1–1.0)
Monocytes Relative: 7.8 % (ref 3.0–12.0)
NEUTROS PCT: 58.3 % (ref 43.0–77.0)
Neutro Abs: 3.9 10*3/uL (ref 1.4–7.7)
Platelets: 320 10*3/uL (ref 150.0–400.0)
RBC: 4.59 Mil/uL (ref 3.87–5.11)
RDW: 13.4 % (ref 11.5–15.5)
WBC: 6.7 10*3/uL (ref 4.0–10.5)

## 2019-02-07 LAB — HEPATIC FUNCTION PANEL
ALT: 9 U/L (ref 0–35)
AST: 18 U/L (ref 0–37)
Albumin: 4.3 g/dL (ref 3.5–5.2)
Alkaline Phosphatase: 108 U/L (ref 39–117)
Bilirubin, Direct: 0.1 mg/dL (ref 0.0–0.3)
Total Bilirubin: 0.5 mg/dL (ref 0.2–1.2)
Total Protein: 7.2 g/dL (ref 6.0–8.3)

## 2019-02-07 LAB — LIPID PANEL
Cholesterol: 199 mg/dL (ref 0–200)
HDL: 53.5 mg/dL
LDL Cholesterol: 117 mg/dL — ABNORMAL HIGH (ref 0–99)
NonHDL: 145.47
Total CHOL/HDL Ratio: 4
Triglycerides: 141 mg/dL (ref 0.0–149.0)
VLDL: 28.2 mg/dL (ref 0.0–40.0)

## 2019-02-07 LAB — BASIC METABOLIC PANEL WITH GFR
BUN: 15 mg/dL (ref 6–23)
CO2: 27 meq/L (ref 19–32)
Calcium: 9.2 mg/dL (ref 8.4–10.5)
Chloride: 100 meq/L (ref 96–112)
Creatinine, Ser: 0.99 mg/dL (ref 0.40–1.20)
GFR: 56.65 mL/min — ABNORMAL LOW
Glucose, Bld: 108 mg/dL — ABNORMAL HIGH (ref 70–99)
Potassium: 3.6 meq/L (ref 3.5–5.1)
Sodium: 137 meq/L (ref 135–145)

## 2019-02-07 LAB — TSH: TSH: 0.88 u[IU]/mL (ref 0.35–4.50)

## 2019-02-07 LAB — HEMOGLOBIN A1C: Hgb A1c MFr Bld: 5.8 % (ref 4.6–6.5)

## 2019-02-08 ENCOUNTER — Encounter: Payer: Self-pay | Admitting: Internal Medicine

## 2019-02-08 ENCOUNTER — Ambulatory Visit (INDEPENDENT_AMBULATORY_CARE_PROVIDER_SITE_OTHER): Payer: BLUE CROSS/BLUE SHIELD | Admitting: Internal Medicine

## 2019-02-08 DIAGNOSIS — G8929 Other chronic pain: Secondary | ICD-10-CM

## 2019-02-08 DIAGNOSIS — F32A Depression, unspecified: Secondary | ICD-10-CM

## 2019-02-08 DIAGNOSIS — F419 Anxiety disorder, unspecified: Secondary | ICD-10-CM | POA: Diagnosis not present

## 2019-02-08 DIAGNOSIS — F329 Major depressive disorder, single episode, unspecified: Secondary | ICD-10-CM

## 2019-02-08 DIAGNOSIS — E7800 Pure hypercholesterolemia, unspecified: Secondary | ICD-10-CM

## 2019-02-08 DIAGNOSIS — K219 Gastro-esophageal reflux disease without esophagitis: Secondary | ICD-10-CM

## 2019-02-08 DIAGNOSIS — I10 Essential (primary) hypertension: Secondary | ICD-10-CM

## 2019-02-08 DIAGNOSIS — M549 Dorsalgia, unspecified: Secondary | ICD-10-CM | POA: Diagnosis not present

## 2019-02-08 DIAGNOSIS — R739 Hyperglycemia, unspecified: Secondary | ICD-10-CM

## 2019-02-08 DIAGNOSIS — I700 Atherosclerosis of aorta: Secondary | ICD-10-CM

## 2019-02-08 NOTE — Progress Notes (Signed)
Patient ID: Angel Riddle, female   DOB: [birth year], 63 y.o   MRN: [id number]   Subjective:    Patient ID: Angel Riddle, female    DOB: July 12, 1956, 63 y.o   MRN: [id number]  HPI  Patient here for a scheduled follow up.  She is doing better.  Saw Dr Nicolasa Ducking.  cymbalta added.  Also, xanax was changed to clonazepam.  Feels better.  Handling stress better.  Increased stress due to her husband's medical issues.  She does feel handling things better.  No chest pain.  No sob.  No acid reflux reported.  No abdominal pain.  Bowels moving.  No urine change.  Discussed the need for continued f/u with Dr Nicolasa Ducking.  She is in agreement.     Past Medical History:  Diagnosis Date  . Allergy   . Anxiety   . Arthritis   . Cancer (HCC)    basal cell nose  . Chronic back pain    degenerative disc disease  . Degenerative disc disease, lumbar   . Depression   . Dyspnea    with exertion / pain  . Gallstones   . GERD (gastroesophageal reflux disease)   . Heart murmur    followed by PCP  . Hx of colonic polyp   . Hx: UTI (urinary tract infection)   . Hypercholesterolemia   . Hypertension   . Panic attacks    H/O  . Wears dentures    partial upper and lower   Past Surgical History:  Procedure Laterality Date  . ABDOMINAL HYSTERECTOMY  1992   partial  . BACK SURGERY     lumbar fusion  . CATARACT EXTRACTION Bilateral 2010,2011  . CHOLECYSTECTOMY  04-19-14  . COLONOSCOPY  07/02/11   DR. Byrnett  . COLONOSCOPY WITH PROPOFOL N/A 09/25/2015   Procedure: COLONOSCOPY WITH PROPOFOL;  Surgeon: Lucilla Lame, MD;  Location: [facility];  Service: Endoscopy;  Laterality: N/A;  . FOOT SURGERY Right 2009  . MOUTH SURGERY    . POLYPECTOMY  09/25/2015   Procedure: POLYPECTOMY;  Surgeon: Lucilla Lame, MD;  Location: [facility];  Service: Endoscopy;;  . skin surgery Left 08/27/13   Basal cell removal-left nostril  . TONSILLECTOMY AND ADENOIDECTOMY  1969   Family History  Problem Relation  Age of Onset  . Hyperlipidemia Mother   . Hypertension Mother   . Alcohol abuse Father   . Hyperlipidemia Father   . Heart disease Father   . Diabetes Father   . Hyperlipidemia Sister   . Lung cancer Brother   . Hyperlipidemia Brother   . Breast cancer Neg Hx    Social History   Socioeconomic History  . Marital status: Married    Spouse name: Not on file  . Number of children: 2  . Years of education: Not on file  . Highest education level: Not on file  Occupational History  . Not on file  Social Needs  . Financial resource strain: Not on file  . Food insecurity:    Worry: Not on file    Inability: Not on file  . Transportation needs:    Medical: Not on file    Non-medical: Not on file  Tobacco Use  . Smoking status: Former Smoker    Packs/day: 1.00    Years: 37.00    Pack years: 37.00    Last attempt to quit: 2012    Years since quitting: 8.1  . Smokeless tobacco: Never Used  Substance and Sexual  Activity  . Alcohol use: Yes    Alcohol/week: 1.0 standard drinks    Types: 1 Glasses of wine per week  . Drug use: No  . Sexual activity: Not on file  Lifestyle  . Physical activity:    Days per week: Not on file    Minutes per session: Not on file  . Stress: Not on file  Relationships  . Social connections:    Talks on phone: Not on file    Gets together: Not on file    Attends religious service: Not on file    Active member of club or organization: Not on file    Attends meetings of clubs or organizations: Not on file    Relationship status: Not on file  Other Topics Concern  . Not on file  Social History Narrative  . Not on file    Outpatient Encounter Medications as of 02/08/2019  Medication Sig  . ALPRAZolam (XANAX) 1 MG tablet TAKE (1) TABLET TWICE A DAY.  Marland Kitchen ezetimibe (ZETIA) 10 MG tablet Take 1 tablet (10 mg total) by mouth daily.  Marland Kitchen FLUoxetine (PROZAC) 40 MG capsule Take 1 capsule (40 mg total) by mouth daily.  . fluticasone (FLONASE) 50 MCG/ACT  nasal spray Place 1 spray into both nostrils daily as needed for allergies. Reported on 11/25/2015  . hydrochlorothiazide (MICROZIDE) 12.5 MG capsule TAKE ONE CAPSULE DAILY.  Marland Kitchen HYDROcodone-acetaminophen (NORCO) 7.5-325 MG tablet   . hydrocortisone (ANUSOL-HC) 25 MG suppository Place 1 suppository (25 mg total) rectally 2 (two) times daily.  Marland Kitchen loratadine (CLARITIN) 10 MG tablet Take 10 mg by mouth daily as needed for allergies.  . meloxicam (MOBIC) 15 MG tablet Take 1 tablet (15 mg total) by mouth daily.  Marland Kitchen omeprazole (PRILOSEC) 40 MG capsule Take 40 mg by mouth daily.   . potassium chloride (K-DUR) 10 MEQ tablet Take 1 tablet (10 mEq total) by mouth daily.  . potassium chloride (K-DUR,KLOR-CON) 10 MEQ tablet Take 1 tablet (10 mEq total) by mouth daily.  . pravastatin (PRAVACHOL) 10 MG tablet Take 1 tablet (10 mg total) by mouth daily.  . promethazine (PHENERGAN) 12.5 MG tablet Take 1 tablet (12.5 mg total) by mouth 2 (two) times daily as needed for nausea or vomiting.  Marland Kitchen tiZANidine (ZANAFLEX) 4 MG tablet Take 2-4 mg by mouth 3 times daily as needed for muscle spasms   No facility-administered encounter medications on file as of 02/08/2019.     Review of Systems  Constitutional: Negative for appetite change and unexpected weight change.  HENT: Negative for congestion and sinus pressure.   Respiratory: Negative for cough, chest tightness and shortness of breath.   Cardiovascular: Negative for chest pain, palpitations and leg swelling.  Gastrointestinal: Negative for abdominal pain, diarrhea, nausea and vomiting.  Genitourinary: Negative for difficulty urinating and dysuria.  Musculoskeletal: Negative for joint swelling and myalgias.  Skin: Negative for color change and rash.  Neurological: Negative for dizziness, light-headedness and headaches.  Psychiatric/Behavioral: Negative for agitation and dysphoric mood.       Objective:    Physical Exam Constitutional:      General: She is not  in acute distress.    Appearance: Normal appearance.  HENT:     Nose: Nose normal. No congestion.     Mouth/Throat:     Pharynx: No oropharyngeal exudate or posterior oropharyngeal erythema.  Neck:     Musculoskeletal: Neck supple. No muscular tenderness.     Thyroid: No thyromegaly.  Cardiovascular:  Rate and Rhythm: Normal rate and regular rhythm.  Pulmonary:     Effort: No respiratory distress.     Breath sounds: Normal breath sounds. No wheezing.  Abdominal:     General: Bowel sounds are normal.     Palpations: Abdomen is soft.     Tenderness: There is no abdominal tenderness.  Musculoskeletal:        General: No swelling or tenderness.  Lymphadenopathy:     Cervical: No cervical adenopathy.  Skin:    Findings: No erythema or rash.  Neurological:     Mental Status: She is alert.  Psychiatric:        Mood and Affect: Mood normal.        Behavior: Behavior normal.     BP 110/78   Pulse 67   Temp 99.4 F (37.4 C) (Oral)   Wt 194 lb 12.8 oz (88.4 kg)   SpO2 95%   BMI 34.51 kg/m  Wt Readings from Last 3 Encounters:  02/08/19 194 lb 12.8 oz (88.4 kg)  01/31/19 190 lb (86.2 kg)  10/18/18 189 lb (85.7 kg)     Lab Results  Component Value Date   WBC 6.7 02/07/2019   HGB 13.4 02/07/2019   HCT 39.2 02/07/2019   PLT 320.0 02/07/2019   GLUCOSE 108 (H) 02/07/2019   CHOL 199 02/07/2019   TRIG 141.0 02/07/2019   HDL 53.50 02/07/2019   LDLDIRECT 166.0 10/17/2018   LDLCALC 117 (H) 02/07/2019   ALT 9 02/07/2019   AST 18 02/07/2019   NA 137 02/07/2019   K 3.6 02/07/2019   CL 100 02/07/2019   CREATININE 0.99 02/07/2019   BUN 15 02/07/2019   CO2 27 02/07/2019   TSH 0.88 02/07/2019   HGBA1C 5.8 02/07/2019    Ct Chest Lung Ca Screen Low Dose W/o Cm  Result Date: 01/31/2019 CLINICAL DATA:  63 year old female former smoker, quit 7 years ago, with 37 pack-year history of smoking, for follow-up lung cancer screening EXAM: CT CHEST WITHOUT CONTRAST LOW-DOSE FOR LUNG  CANCER SCREENING TECHNIQUE: Multidetector CT imaging of the chest was performed following the standard protocol without IV contrast. COMPARISON:  Low-dose lung cancer screening CT chest dated 12/26/2017 FINDINGS: Cardiovascular: The heart is normal in size. No pericardial effusion. No evidence of thoracic aortic aneurysm. Mild atherosclerotic calcifications of the aortic arch. Mediastinum/Nodes: No suspicious mediastinal lymphadenopathy. Visualized thyroid is unremarkable. Lungs/Pleura: Mild centrilobular emphysematous changes, upper lobe predominant. No focal consolidation. Mild lingular scarring. 1.9 mm subpleural nodule in the left upper lobe. No pleural effusion or pneumothorax. Upper Abdomen: Visualized upper abdomen is notable for prior cholecystectomy. Musculoskeletal: Mild degenerative changes of the visualized thoracolumbar spine. IMPRESSION: Lung-RADS 2, benign appearance or behavior. Continue annual screening with low-dose chest CT without contrast in 12 months. Aortic Atherosclerosis (ICD10-I70.0) and Emphysema (ICD10-J43.9). Electronically Signed   By: Julian Hy M.D.   On: 01/31/2019 15:15       Assessment & Plan:   Problem List Items Addressed This Visit    Anxiety    Seeing Dr Nicolasa Ducking.  Medications adjusted as outlined.  Doing better. Continue f/u with Dr Nicolasa Ducking.       Aortic atherosclerosis (HCC)    On pravastatin.        Chronic back pain    S/p surgery.  Stable.        Depression    Seeing Dr Nicolasa Ducking. Medications adjusted as outlined.  Discussed the need for continued f/u with Dr Nicolasa Ducking.  GERD (gastroesophageal reflux disease)    Controlled on current regimen.        Hypercholesterolemia    On pravastatin.  Low cholesterol diet and exercise.  Follow lipid panel and liver function tests.        Relevant Orders   Hepatic function panel   Lipid panel   Hyperglycemia    Low carb diet and exercise.  Follow met b and a1c.       Relevant Orders   Hemoglobin  A1c   Hypertension    Blood pressure under good control.  Continue same medication regimen.  Follow pressures.  Follow metabolic panel.        Relevant Orders   Basic metabolic panel       Einar Pheasant, MD

## 2019-02-09 ENCOUNTER — Telehealth: Payer: Self-pay

## 2019-02-09 NOTE — Telephone Encounter (Signed)
Copied from [place] [id number]. Topic: Medical Record Request - Other >> Feb 09, 2019  9:27 AM Lennox Solders wrote: Reason for CRM:  pt is at dr Herma Mering dermatologist office and we referral her. Dr Darrick Huntsman office needs a copy of pt med list please fax to [phone] and phone number [phone]

## 2019-02-09 NOTE — Telephone Encounter (Signed)
Faxed med list to Dr. Darrick Huntsman @ [facility] per pt request.  Called office to inform that fax has been sent.

## 2019-02-10 ENCOUNTER — Encounter: Payer: Self-pay | Admitting: Internal Medicine

## 2019-02-10 DIAGNOSIS — I700 Atherosclerosis of aorta: Secondary | ICD-10-CM | POA: Insufficient documentation

## 2019-02-10 NOTE — Assessment & Plan Note (Signed)
On pravastatin.   

## 2019-02-10 NOTE — Assessment & Plan Note (Signed)
Seeing Dr Nicolasa Ducking. Medications adjusted as outlined.  Discussed the need for continued f/u with Dr Nicolasa Ducking.

## 2019-02-10 NOTE — Assessment & Plan Note (Signed)
Controlled on current regimen.   

## 2019-02-10 NOTE — Assessment & Plan Note (Signed)
S/p surgery.  Stable.  

## 2019-02-10 NOTE — Assessment & Plan Note (Signed)
Blood pressure under good control.  Continue same medication regimen.  Follow pressures.  Follow metabolic panel.   

## 2019-02-10 NOTE — Assessment & Plan Note (Signed)
Low carb diet and exercise.  Follow met b and a1c.  

## 2019-02-10 NOTE — Assessment & Plan Note (Signed)
On pravastatin.  Low cholesterol diet and exercise.  Follow lipid panel and liver function tests.   

## 2019-02-10 NOTE — Assessment & Plan Note (Signed)
Seeing Dr Nicolasa Ducking.  Medications adjusted as outlined.  Doing better. Continue f/u with Dr Nicolasa Ducking.

## 2019-02-12 ENCOUNTER — Telehealth: Payer: Self-pay

## 2019-02-12 MED ORDER — OSELTAMIVIR PHOSPHATE 75 MG PO CAPS: 75.0000 mg | ORAL_CAPSULE | Freq: Two times a day (BID) | ORAL | 0 refills | Status: DC

## 2019-02-12 MED ORDER — PREDNISONE 10 MG PO TABS: ORAL_TABLET | ORAL | 0 refills | Status: DC

## 2019-02-12 MED ORDER — AMOXICILLIN-POT CLAVULANATE 875-125 MG PO TABS: 1.0000 | ORAL_TABLET | Freq: Two times a day (BID) | ORAL | 0 refills | Status: DC

## 2019-02-12 NOTE — Telephone Encounter (Signed)
Copied from [place] [id number]. Topic: General - Inquiry >> Feb 12, 2019  2:23 PM [name] wrote: Reason for CRM: pt seen Friday by Dr Nicki Reaper and states she told the dr she thought she had a sinus infection.  Pt states she was instructed to call the dr back if not better this week. Pt states she is not better..pt is.worse.  pt states she has a low grade fever, body aches, a severe cough and head ache.  Pt states she thinks she has the flu.  She wants the dr to know and advise on what she should do.  [place], [place] - [place] [phone] (Phone) [phone] (Fax)

## 2019-02-12 NOTE — Telephone Encounter (Signed)
Called pt back to give instructions per Dr. Lupita Dawn note and make pt aware that Rx will be called in.  Pt stated that her symptoms actually started last night.  Pt stated that she had a fever last night of 102 and has been taking TheraFlu nighttime adding honey, Sudafed, and Mucinex.  Pt says that she was around her great nephew last week who was later diagnosed with the flu.  Please advise.  Pt waiting on call back before picking up previous Rx called in.

## 2019-02-12 NOTE — Telephone Encounter (Signed)
TAMIFLU CALLED IN   .  SHE CAN PICK UP THE OTHER MEDS  IN CASE SHE DEVELOPS SINUS INFECTION OR SHE CAN REFUSE THEM AND JUST GET THE TAMIFLU

## 2019-02-12 NOTE — Telephone Encounter (Signed)
She would be "out of the window" for treatment of influenza if her symptoms started last week.  I will call her in a prednisone taper and an antibiotic to take for 7 days.   Tylenol and motrin for the headache  Daily use ofan OTC  Probiotic  for  3 weeks advised to reduce risk of C dificile colitis bc of antibiotic use

## 2019-02-12 NOTE — Telephone Encounter (Signed)
Called pt to inform that Tamiflu has been called in. Pt said that she will probably pick up other meds that were called in earlier as well.

## 2019-03-10 ENCOUNTER — Other Ambulatory Visit: Payer: Self-pay | Admitting: Internal Medicine

## 2019-03-10 ENCOUNTER — Other Ambulatory Visit: Payer: Self-pay | Admitting: Cardiovascular Disease

## 2019-03-13 ENCOUNTER — Other Ambulatory Visit: Payer: Self-pay | Admitting: Internal Medicine

## 2019-05-02 ENCOUNTER — Other Ambulatory Visit: Payer: Self-pay | Admitting: Internal Medicine

## 2019-05-07 ENCOUNTER — Other Ambulatory Visit: Payer: Self-pay | Admitting: Internal Medicine

## 2019-05-07 ENCOUNTER — Other Ambulatory Visit: Payer: Self-pay | Admitting: Cardiovascular Disease

## 2019-05-09 ENCOUNTER — Telehealth: Payer: Self-pay | Admitting: *Deleted

## 2019-05-09 ENCOUNTER — Telehealth: Payer: Self-pay | Admitting: Internal Medicine

## 2019-05-09 MED ORDER — EZETIMIBE 10 MG PO TABS: 10.0000 mg | ORAL_TABLET | Freq: Every day | ORAL | 2 refills | Status: DC

## 2019-05-09 NOTE — Telephone Encounter (Signed)
zetia rx sent in to [facility].

## 2019-05-09 NOTE — Telephone Encounter (Signed)
Copied from [place] [id number]. Topic: General - Call Back - No Documentation >> May 09, 2019 10:13 AM Marin Olp L wrote: Reason for CRM: Missed a call just now

## 2019-05-09 NOTE — Telephone Encounter (Signed)
Copied from [place] [id number]. Topic: Quick Communication - Rx Refill/Question >> May 09, 2019  9:33 AM Leward Quan A wrote: Medication: ezetimibe (ZETIA) 10 MG tablet  Has the patient contacted their pharmacy? Yes.   (Agent: If no, request that the patient contact the pharmacy for the refill.) (Agent: If yes, when and what did the pharmacy advise?)  Preferred Pharmacy (with phone number or street name): [place], [place] [phone] (Phone) [phone] (Fax)    Agent: Please be advised that RX refills may take up to 3 business days. We ask that you follow-up with your pharmacy.

## 2019-05-09 NOTE — Telephone Encounter (Signed)
Previously filled by Dr. Rockey Situ. Pt would like for you to start filling. Are you okay with taking over?

## 2019-05-10 NOTE — Telephone Encounter (Signed)
Patient is aware 

## 2019-05-21 ENCOUNTER — Other Ambulatory Visit: Payer: Self-pay | Admitting: Internal Medicine

## 2019-06-11 ENCOUNTER — Other Ambulatory Visit: Payer: Self-pay | Admitting: Internal Medicine

## 2019-06-27 ENCOUNTER — Other Ambulatory Visit: Payer: BLUE CROSS/BLUE SHIELD

## 2019-07-02 ENCOUNTER — Ambulatory Visit: Payer: BLUE CROSS/BLUE SHIELD | Admitting: Internal Medicine

## 2019-07-02 ENCOUNTER — Encounter: Payer: Self-pay | Admitting: Internal Medicine

## 2019-07-02 ENCOUNTER — Ambulatory Visit (INDEPENDENT_AMBULATORY_CARE_PROVIDER_SITE_OTHER): Payer: BC Managed Care – PPO | Admitting: Internal Medicine

## 2019-07-02 ENCOUNTER — Other Ambulatory Visit: Payer: Self-pay

## 2019-07-02 DIAGNOSIS — Z9109 Other allergy status, other than to drugs and biological substances: Secondary | ICD-10-CM | POA: Diagnosis not present

## 2019-07-02 DIAGNOSIS — K219 Gastro-esophageal reflux disease without esophagitis: Secondary | ICD-10-CM

## 2019-07-02 DIAGNOSIS — F419 Anxiety disorder, unspecified: Secondary | ICD-10-CM

## 2019-07-02 DIAGNOSIS — F329 Major depressive disorder, single episode, unspecified: Secondary | ICD-10-CM | POA: Diagnosis not present

## 2019-07-02 DIAGNOSIS — R739 Hyperglycemia, unspecified: Secondary | ICD-10-CM

## 2019-07-02 DIAGNOSIS — Z1239 Encounter for other screening for malignant neoplasm of breast: Secondary | ICD-10-CM

## 2019-07-02 DIAGNOSIS — I700 Atherosclerosis of aorta: Secondary | ICD-10-CM

## 2019-07-02 DIAGNOSIS — E7800 Pure hypercholesterolemia, unspecified: Secondary | ICD-10-CM

## 2019-07-02 DIAGNOSIS — I10 Essential (primary) hypertension: Secondary | ICD-10-CM

## 2019-07-02 DIAGNOSIS — F32A Depression, unspecified: Secondary | ICD-10-CM

## 2019-07-02 NOTE — Progress Notes (Signed)
Patient ID: Angel Riddle, female   DOB: [birth year], 63 y.o.   MRN: [id number]   Virtual Visit via telephone Note  This visit type was conducted due to national recommendations for restrictions regarding the COVID-19 pandemic (e.g. social distancing).  This format is felt to be most appropriate for this patient at this time.  All issues noted in this document were discussed and addressed.  No physical exam was performed (except for noted visual exam findings with Video Visits).   I connected with Angel Riddle by telephone and verified that I am speaking with the correct person using two identifiers. Location patient: home Location provider: work Persons participating in the telephone visit: patient, provider  I discussed the limitations, risks, security and privacy concerns of performing an evaluation and management service by telephone and the availability of in person appointments. The patient expressed understanding and agreed to proceed.  Reason for visit: scheduled follow up  HPI: Increased stress with her husband's medical issues.  Has moved into a new condo.  Seeing Dr Nicolasa Ducking.  Now on cymbalta, trazodone and prozac.  Discussed with her. She does feel the medication is helping.  Still feels she needs something to have to take if needed.  She plans to discuss with Dr Nicolasa Ducking.  Discussed importance of staying active.  No chest pain.  No sob.  No acid reflux.  No abdominal pain.  Bowels moving.  Taking miralax q hs.  Reports has gained 8 pounds.  Discussed diet adjustment and weight loss.     ROS: See pertinent positives and negatives per HPI.  Past Medical History:  Diagnosis Date  . Allergy   . Anxiety   . Arthritis   . Cancer (HCC)    basal cell nose  . Chronic back pain    degenerative disc disease  . Degenerative disc disease, lumbar   . Depression   . Dyspnea    with exertion / pain  . Gallstones   . GERD (gastroesophageal reflux disease)   . Heart murmur    followed  by PCP  . Hx of colonic polyp   . Hx: UTI (urinary tract infection)   . Hypercholesterolemia   . Hypertension   . Panic attacks    H/O  . Wears dentures    partial upper and lower    Past Surgical History:  Procedure Laterality Date  . ABDOMINAL HYSTERECTOMY  1992   partial  . BACK SURGERY     lumbar fusion  . CATARACT EXTRACTION Bilateral 2010,2011  . CHOLECYSTECTOMY  04-19-14  . COLONOSCOPY  07/02/11   DR. Byrnett  . COLONOSCOPY WITH PROPOFOL N/A 09/25/2015   Procedure: COLONOSCOPY WITH PROPOFOL;  Surgeon: Lucilla Lame, MD;  Location: [facility];  Service: Endoscopy;  Laterality: N/A;  . FOOT SURGERY Right 2009  . MOUTH SURGERY    . POLYPECTOMY  09/25/2015   Procedure: POLYPECTOMY;  Surgeon: Lucilla Lame, MD;  Location: [facility];  Service: Endoscopy;;  . skin surgery Left 08/27/13   Basal cell removal-left nostril  . TONSILLECTOMY AND ADENOIDECTOMY  1969    Family History  Problem Relation Age of Onset  . Hyperlipidemia Mother   . Hypertension Mother   . Alcohol abuse Father   . Hyperlipidemia Father   . Heart disease Father   . Diabetes Father   . Hyperlipidemia Sister   . Lung cancer Brother   . Hyperlipidemia Brother   . Breast cancer Neg Hx     SOCIAL HX: reviewed.  Current Outpatient Medications:  .  DULoxetine (CYMBALTA) 60 MG capsule, Take 1 capsule by mouth daily., Disp: , Rfl:  .  ezetimibe (ZETIA) 10 MG tablet, Take 1 tablet (10 mg total) by mouth daily., Disp: 30 tablet, Rfl: 2 .  FLUoxetine (PROZAC) 40 MG capsule, Take 1 capsule (40 mg total) by mouth daily., Disp: 30 capsule, Rfl: 0 .  fluticasone (FLONASE) 50 MCG/ACT nasal spray, Place 1 spray into both nostrils daily as needed for allergies. Reported on 11/25/2015, Disp: , Rfl:  .  hydrochlorothiazide (MICROZIDE) 12.5 MG capsule, TAKE ONE CAPSULE DAILY., Disp: 90 capsule, Rfl: 0 .  HYDROcodone-acetaminophen (NORCO) 7.5-325 MG tablet, , Disp: , Rfl:  .  hydrocortisone  (ANUSOL-HC) 25 MG suppository, Place 1 suppository (25 mg total) rectally 2 (two) times daily., Disp: 14 suppository, Rfl: 0 .  loratadine (CLARITIN) 10 MG tablet, Take 10 mg by mouth daily as needed for allergies., Disp: , Rfl:  .  meloxicam (MOBIC) 15 MG tablet, Take 1 tablet (15 mg total) by mouth daily., Disp: 30 tablet, Rfl: 2 .  omeprazole (PRILOSEC) 40 MG capsule, Take 40 mg by mouth daily. , Disp: , Rfl:  .  potassium chloride (K-DUR) 10 MEQ tablet, Take 1 tablet (10 mEq total) by mouth daily., Disp: 30 tablet, Rfl: 5 .  potassium chloride (K-DUR,KLOR-CON) 10 MEQ tablet, Take 1 tablet (10 mEq total) by mouth daily., Disp: 30 tablet, Rfl: 2 .  pravastatin (PRAVACHOL) 10 MG tablet, Take 1 tablet (10 mg total) by mouth daily., Disp: 90 tablet, Rfl: 1 .  tiZANidine (ZANAFLEX) 4 MG tablet, Take 2-4 mg by mouth 3 times daily as needed for muscle spasms, Disp: , Rfl:  .  traZODone (DESYREL) 50 MG tablet, Take 2 tablets by mouth at bedtime., Disp: , Rfl:  .  ALPRAZolam (XANAX) 1 MG tablet, TAKE (1) TABLET TWICE A DAY. (Patient not taking: Reported on 07/02/2019), Disp: 60 tablet, Rfl: 0 .  amoxicillin-clavulanate (AUGMENTIN) 875-125 MG tablet, Take 1 tablet by mouth 2 (two) times daily. (Patient not taking: Reported on 07/02/2019), Disp: 14 tablet, Rfl: 0 .  nitrofurantoin, macrocrystal-monohydrate, (MACROBID) 100 MG capsule, Take 1 capsule (100 mg total) by mouth 2 (two) times daily. With food, Disp: 10 capsule, Rfl: 0 .  oseltamivir (TAMIFLU) 75 MG capsule, Take 1 capsule (75 mg total) by mouth 2 (two) times daily. (Patient not taking: Reported on 07/02/2019), Disp: 10 capsule, Rfl: 0 .  predniSONE (DELTASONE) 10 MG tablet, 6 tablets on Day 1 , then reduce by 1 tablet daily until gone (Patient not taking: Reported on 07/02/2019), Disp: 21 tablet, Rfl: 0 .  promethazine (PHENERGAN) 12.5 MG tablet, Take 1 tablet (12.5 mg total) by mouth 2 (two) times daily as needed for nausea or vomiting. (Patient not  taking: Reported on 07/02/2019), Disp: 20 tablet, Rfl: 0  EXAM:  VITALS per patient if applicable: [phone]  GENERAL: alert.  Sounds to be in no acute distress.  Answering questions appropriately.    PSYCH/NEURO: pleasant and cooperative, no obvious depression or anxiety, speech and thought processing grossly intact  ASSESSMENT AND PLAN:  Discussed the following assessment and plan:  Anxiety Being followed by Dr Nicolasa Ducking.  Doing better on her current medication regimen.  Still feels needs something to have to take prn.  Plans to discuss with Dr Nicolasa Ducking.    Aortic atherosclerosis (HCC) On pravastatin.    Depression Followed by Dr Nicolasa Ducking as outlined.  Plans to f/u regarding the question of need to have something  if needed.  Follow.   Environmental allergies Stable.   GERD (gastroesophageal reflux disease) Controlled on current regimen.  Follow.    Hypercholesterolemia On pravastatin.  Low cholesterol diet and exercise.  Follow lipid panel and liver function tests.    Hyperglycemia Low carb diet and exercise.  Follow met b and a1c.   Hypertension Blood pressure as outlined.  Follow pressures.  Follow metabolic panel.    Breast cancer screening She will schedule her own mammogram.      I discussed the assessment and treatment plan with the patient. The patient was provided an opportunity to ask questions and all were answered. The patient agreed with the plan and demonstrated an understanding of the instructions.   The patient was advised to call back or seek an in-person evaluation if the symptoms worsen or if the condition fails to improve as anticipated.  I provided 25 minutes of non-face-to-face time during this encounter.   Einar Pheasant, MD

## 2019-07-03 ENCOUNTER — Telehealth: Payer: Self-pay

## 2019-07-03 NOTE — Telephone Encounter (Signed)
Called pt to schedule appt for tomorrow.  No answer.  LMTCB in morning.

## 2019-07-03 NOTE — Telephone Encounter (Signed)
Copied from [place] [id number]. Topic: General - Other >> Jul 03, 2019  4:27 PM Leward Quan A wrote: Reason for CRM: Patient called to say that she is experiencing a UTI, pressure in the lower abdomen, cramping a little after urinating and a little cloudy and little blood after urination on 07/02/2019. Patient would like an Rx sent to the pharmacy please. Ph#  [phone] [place], [place] [phone] (Phone) [phone] (Fax)

## 2019-07-04 ENCOUNTER — Other Ambulatory Visit: Payer: Self-pay

## 2019-07-04 ENCOUNTER — Telehealth: Payer: Self-pay

## 2019-07-04 ENCOUNTER — Ambulatory Visit (INDEPENDENT_AMBULATORY_CARE_PROVIDER_SITE_OTHER): Payer: BC Managed Care – PPO | Admitting: Internal Medicine

## 2019-07-04 DIAGNOSIS — R319 Hematuria, unspecified: Secondary | ICD-10-CM | POA: Diagnosis not present

## 2019-07-04 DIAGNOSIS — N3000 Acute cystitis without hematuria: Secondary | ICD-10-CM | POA: Diagnosis not present

## 2019-07-04 MED ORDER — NITROFURANTOIN MONOHYD MACRO 100 MG PO CAPS: 100.0000 mg | ORAL_CAPSULE | Freq: Two times a day (BID) | ORAL | 0 refills | Status: DC

## 2019-07-04 NOTE — Telephone Encounter (Signed)
Copied from [place] [id number]. Topic: General - Other >> Jul 03, 2019  4:27 PM Leward Quan A wrote: Reason for CRM: Patient called to say that she is experiencing a UTI, pressure in the lower abdomen, cramping a little after urinating and a little cloudy and little blood after urination on 07/02/2019. Patient would like an Rx sent to the pharmacy please. Ph#  [phone] [place], [place] [phone] (Phone) [phone] (Fax)

## 2019-07-04 NOTE — Telephone Encounter (Signed)
Called and spoke to pt.  Scheduled pt a virtual visit today w/ Dr. Aundra Dubin @ 1:00 pm.

## 2019-07-04 NOTE — Progress Notes (Signed)
Telephone Note  I connected with Angel Riddle   on 07/04/19 at  1:10 PM EDT by telphone and verified that I am speaking with the correct person using two identifiers.  Location patient: home Location provider:work  Persons participating in the virtual visit: patient, provider  I discussed the limitations of evaluation and management by telemedicine and the availability of in person appointments. The patient expressed understanding and agreed to proceed.   HPI: C/o urinary urgency and having to strain at end of stream and blood in urine on Monday tried AZO pills otc 2 in the am and 2 qhs she is better today but c/w with UTI. Denies fever or dysuria. She has also been drinking a lot of cranberry juice with water  Reviewed [place] 04/13/16 + left 8 mm kidney stone    ROS: See pertinent positives and negatives per HPI.  Past Medical History:  Diagnosis Date  . Allergy   . Anxiety   . Arthritis   . Cancer (HCC)    basal cell nose  . Chronic back pain    degenerative disc disease  . Degenerative disc disease, lumbar   . Depression   . Dyspnea    with exertion / pain  . Gallstones   . GERD (gastroesophageal reflux disease)   . Heart murmur    followed by PCP  . Hx of colonic polyp   . Hx: UTI (urinary tract infection)   . Hypercholesterolemia   . Hypertension   . Panic attacks    H/O  . Wears dentures    partial upper and lower    Past Surgical History:  Procedure Laterality Date  . ABDOMINAL HYSTERECTOMY  1992   partial  . BACK SURGERY     lumbar fusion  . CATARACT EXTRACTION Bilateral 2010,2011  . CHOLECYSTECTOMY  04-19-14  . COLONOSCOPY  07/02/11   DR. Byrnett  . COLONOSCOPY WITH PROPOFOL N/A 09/25/2015   Procedure: COLONOSCOPY WITH PROPOFOL;  Surgeon: Lucilla Lame, MD;  Location: [place];  Service: Endoscopy;  Laterality: N/A;  . FOOT SURGERY Right 2009  . MOUTH SURGERY    . POLYPECTOMY  09/25/2015   Procedure: POLYPECTOMY;  Surgeon: Lucilla Lame, MD;   Location: [place];  Service: Endoscopy;;  . skin surgery Left 08/27/13   Basal cell removal-left nostril  . TONSILLECTOMY AND ADENOIDECTOMY  1969    Family History  Problem Relation Age of Onset  . Hyperlipidemia Mother   . Hypertension Mother   . Alcohol abuse Father   . Hyperlipidemia Father   . Heart disease Father   . Diabetes Father   . Hyperlipidemia Sister   . Lung cancer Brother   . Hyperlipidemia Brother   . Breast cancer Neg Hx     SOCIAL HX: lives with husband   Current Outpatient Medications:  .  ALPRAZolam (XANAX) 1 MG tablet, TAKE (1) TABLET TWICE A DAY. (Patient not taking: Reported on 07/02/2019), Disp: 60 tablet, Rfl: 0 .  amoxicillin-clavulanate (AUGMENTIN) 875-125 MG tablet, Take 1 tablet by mouth 2 (two) times daily. (Patient not taking: Reported on 07/02/2019), Disp: 14 tablet, Rfl: 0 .  DULoxetine (CYMBALTA) 60 MG capsule, Take 1 capsule by mouth daily., Disp: , Rfl:  .  ezetimibe (ZETIA) 10 MG tablet, Take 1 tablet (10 mg total) by mouth daily., Disp: 30 tablet, Rfl: 2 .  FLUoxetine (PROZAC) 40 MG capsule, Take 1 capsule (40 mg total) by mouth daily., Disp: 30 capsule, Rfl: 0 .  fluticasone (FLONASE) 50  MCG/ACT nasal spray, Place 1 spray into both nostrils daily as needed for allergies. Reported on 11/25/2015, Disp: , Rfl:  .  hydrochlorothiazide (MICROZIDE) 12.5 MG capsule, TAKE ONE CAPSULE DAILY., Disp: 90 capsule, Rfl: 0 .  HYDROcodone-acetaminophen (NORCO) 7.5-325 MG tablet, , Disp: , Rfl:  .  hydrocortisone (ANUSOL-HC) 25 MG suppository, Place 1 suppository (25 mg total) rectally 2 (two) times daily., Disp: 14 suppository, Rfl: 0 .  loratadine (CLARITIN) 10 MG tablet, Take 10 mg by mouth daily as needed for allergies., Disp: , Rfl:  .  meloxicam (MOBIC) 15 MG tablet, Take 1 tablet (15 mg total) by mouth daily., Disp: 30 tablet, Rfl: 2 .  nitrofurantoin, macrocrystal-monohydrate, (MACROBID) 100 MG capsule, Take 1 capsule (100 mg total) by mouth 2  (two) times daily. With food, Disp: 10 capsule, Rfl: 0 .  omeprazole (PRILOSEC) 40 MG capsule, Take 40 mg by mouth daily. , Disp: , Rfl:  .  oseltamivir (TAMIFLU) 75 MG capsule, Take 1 capsule (75 mg total) by mouth 2 (two) times daily. (Patient not taking: Reported on 07/02/2019), Disp: 10 capsule, Rfl: 0 .  potassium chloride (K-DUR) 10 MEQ tablet, Take 1 tablet (10 mEq total) by mouth daily., Disp: 30 tablet, Rfl: 5 .  potassium chloride (K-DUR,KLOR-CON) 10 MEQ tablet, Take 1 tablet (10 mEq total) by mouth daily., Disp: 30 tablet, Rfl: 2 .  pravastatin (PRAVACHOL) 10 MG tablet, Take 1 tablet (10 mg total) by mouth daily., Disp: 90 tablet, Rfl: 1 .  predniSONE (DELTASONE) 10 MG tablet, 6 tablets on Day 1 , then reduce by 1 tablet daily until gone (Patient not taking: Reported on 07/02/2019), Disp: 21 tablet, Rfl: 0 .  promethazine (PHENERGAN) 12.5 MG tablet, Take 1 tablet (12.5 mg total) by mouth 2 (two) times daily as needed for nausea or vomiting. (Patient not taking: Reported on 07/02/2019), Disp: 20 tablet, Rfl: 0 .  tiZANidine (ZANAFLEX) 4 MG tablet, Take 2-4 mg by mouth 3 times daily as needed for muscle spasms, Disp: , Rfl:  .  traZODone (DESYREL) 50 MG tablet, Take 2 tablets by mouth at bedtime., Disp: , Rfl:   EXAM:  VITALS per patient if applicable:  GENERAL: alert, oriented, appears well and in no acute distress  HEENT: atraumatic, conjunttiva clear, no obvious abnormalities on inspection of external nose and ears  NECK: normal movements of the head and neck  LUNGS: on inspection no signs of respiratory distress, breathing rate appears normal, no obvious gross SOB, gasping or wheezing  CV: no obvious cyanosis  MS: moves all visible extremities without noticeable abnormality  PSYCH/NEURO: pleasant and cooperative, no obvious depression or anxiety, speech and thought processing grossly intact  ASSESSMENT AND PLAN:  Discussed the following assessment and plan:  Acute cystitis  without hematuria - Plan: Urinalysis, Routine w reflex microscopic, Urine Culture, nitrofurantoin, macrocrystal-monohydrate, (MACROBID) 100 MG capsule bid x 5 days,   Hematuria, r/o UTI vs if related kidney stones - Plan: nitrofurantoin, macrocrystal-monohydrate, (MACROBID) 100 MG capsule bid x 5 days  See above if culture negative consider CT renal w/o contrast to w/u stones       I discussed the assessment and treatment plan with the patient. The patient was provided an opportunity to ask questions and all were answered. The patient agreed with the plan and demonstrated an understanding of the instructions.   The patient was advised to call back or seek an in-person evaluation if the symptoms worsen or if the condition fails to improve as anticipated.  Time spent 15 minutes  Delorise Jackson, MD

## 2019-07-05 ENCOUNTER — Telehealth: Payer: Self-pay

## 2019-07-05 ENCOUNTER — Telehealth: Payer: Self-pay | Admitting: Internal Medicine

## 2019-07-05 NOTE — Telephone Encounter (Signed)
Called pt to triage.  No answer.  LMTCB.

## 2019-07-05 NOTE — Telephone Encounter (Signed)
Copied from [place] [id number]. Topic: General - Other >> Jul 05, 2019  1:04 PM Rainey Pines A wrote: Patient has been experiencing diarrhea and abdominal pain since taking antibiotic and was told by provider to call if she experienced these symptoms from medication. Patient has only taking 3 doses since prescribed.  Patient would like a callback from nurse today.

## 2019-07-05 NOTE — Telephone Encounter (Signed)
I called pt and left a vm to call ofc and sch Return for 11/02/19 with PCP Dr. Nicki Reaper .

## 2019-07-06 LAB — URINE CULTURE

## 2019-07-06 LAB — URINALYSIS, ROUTINE W REFLEX MICROSCOPIC
Bilirubin, UA: NEGATIVE
Glucose, UA: NEGATIVE
Ketones, UA: NEGATIVE
Nitrite, UA: POSITIVE — AB
Protein,UA: NEGATIVE
Specific Gravity, UA: 1.013 (ref 1.005–1.030)
Urobilinogen, Ur: 0.2 mg/dL (ref 0.2–1.0)
pH, UA: 5.5 (ref 5.0–7.5)

## 2019-07-06 LAB — MICROSCOPIC EXAMINATION
Casts: NONE SEEN /lpf
Epithelial Cells (non renal): >10 /hpf — AB (ref 0–10)
WBC, UA: >30 /hpf — AB (ref 0–5)

## 2019-07-07 ENCOUNTER — Encounter: Payer: Self-pay | Admitting: Internal Medicine

## 2019-07-07 DIAGNOSIS — Z1239 Encounter for other screening for malignant neoplasm of breast: Secondary | ICD-10-CM | POA: Insufficient documentation

## 2019-07-07 NOTE — Assessment & Plan Note (Signed)
Low carb diet and exercise.  Follow met b and a1c.  

## 2019-07-07 NOTE — Assessment & Plan Note (Signed)
Controlled on current regimen.  Follow.  

## 2019-07-07 NOTE — Assessment & Plan Note (Signed)
Stable

## 2019-07-07 NOTE — Assessment & Plan Note (Signed)
On pravastatin.  Low cholesterol diet and exercise.  Follow lipid panel and liver function tests.   

## 2019-07-07 NOTE — Assessment & Plan Note (Signed)
Blood pressure as outlined.  Follow pressures.  Follow metabolic panel.   

## 2019-07-07 NOTE — Assessment & Plan Note (Signed)
She will schedule her own mammogram.

## 2019-07-07 NOTE — Assessment & Plan Note (Signed)
On pravastatin.   

## 2019-07-07 NOTE — Assessment & Plan Note (Signed)
Being followed by Dr Nicolasa Ducking.  Doing better on her current medication regimen.  Still feels needs something to have to take prn.  Plans to discuss with Dr Nicolasa Ducking.

## 2019-07-07 NOTE — Assessment & Plan Note (Signed)
Followed by Dr Nicolasa Ducking as outlined.  Plans to f/u regarding the question of need to have something if needed.  Follow.

## 2019-07-09 NOTE — Telephone Encounter (Signed)
Pt was on the phone w/ [facility] nurse getting lab results when she was transferred.  Pt said that she is straining to urinate.  Pt said that this is not normal.  Pt says that she feels like her muscles are contracting.  Pt says that diarrhea has stopped.  Pt stopped antibiotic after 3 dosages due to abdominal pain and major diarrhea.    Pt feels that she needs another type of antibiotic called in.  Pt said that she her last visit was with Dr. Aundra Dubin last week.

## 2019-07-09 NOTE — Telephone Encounter (Signed)
She can try Augmentin or Bactrim for this either will treat  What would she like called in since she cant tolerate macrobid?   [place]

## 2019-07-10 ENCOUNTER — Other Ambulatory Visit: Payer: Self-pay | Admitting: Internal Medicine

## 2019-07-10 MED ORDER — AMOXICILLIN-POT CLAVULANATE 875-125 MG PO TABS: 1.0000 | ORAL_TABLET | Freq: Two times a day (BID) | ORAL | 0 refills | Status: DC

## 2019-07-10 NOTE — Telephone Encounter (Signed)
Patient says she has taken Augmentin before could PCP please call this medication in.

## 2019-07-10 NOTE — Telephone Encounter (Signed)
Sent augmentin

## 2019-07-10 NOTE — Telephone Encounter (Signed)
Patient aware.

## 2019-07-12 ENCOUNTER — Encounter: Payer: Self-pay | Admitting: Internal Medicine

## 2019-07-17 ENCOUNTER — Telehealth: Payer: Self-pay | Admitting: Internal Medicine

## 2019-07-17 NOTE — Telephone Encounter (Signed)
I called pt and left a vm to call ofc to sch follow up 11/02/19 with PCP Dr. Nicki Reaper

## 2019-08-15 ENCOUNTER — Ambulatory Visit: Payer: BC Managed Care – PPO | Admitting: Internal Medicine

## 2019-08-23 ENCOUNTER — Other Ambulatory Visit: Payer: Self-pay

## 2019-08-23 ENCOUNTER — Ambulatory Visit (INDEPENDENT_AMBULATORY_CARE_PROVIDER_SITE_OTHER)

## 2019-08-23 ENCOUNTER — Other Ambulatory Visit: Payer: Self-pay | Admitting: Internal Medicine

## 2019-08-23 DIAGNOSIS — Z23 Encounter for immunization: Secondary | ICD-10-CM | POA: Diagnosis not present

## 2019-08-23 NOTE — Progress Notes (Addendum)
Patient came in today for flu vaccine in LD. Patient tolerated well.   Reviewed.  Dr Nicki Reaper

## 2019-09-11 ENCOUNTER — Telehealth: Payer: Self-pay

## 2019-09-11 NOTE — Telephone Encounter (Signed)
Noted.  Let us know if any problems.

## 2019-09-11 NOTE — Telephone Encounter (Signed)
Noted. Left detailed message

## 2019-09-11 NOTE — Telephone Encounter (Signed)
Copied from [address] [id number]. Topic: General - Other >> Sep 11, 2019  2:26 PM Antonieta Iba C wrote: Reason for CRM: pt called in to speak with PCP, CMA, pt says that she is having some insurance issues.. pt wouldn't say the concern but is requesting a call back   CB: [phone]

## 2019-09-12 ENCOUNTER — Other Ambulatory Visit: Payer: Self-pay | Admitting: Internal Medicine

## 2019-09-13 NOTE — Telephone Encounter (Signed)
Please confirm if pt taking still and does she need refill.

## 2019-09-13 NOTE — Telephone Encounter (Signed)
Patient is still taking and needs refill. Sent in x1 per Dr Nicki Reaper

## 2019-10-25 ENCOUNTER — Other Ambulatory Visit: Payer: Self-pay | Admitting: Internal Medicine

## 2019-10-26 NOTE — Telephone Encounter (Signed)
I have refilled her meloxicam.  She needs fasting labs.  Overdue.  Please schedule.

## 2019-11-13 ENCOUNTER — Encounter: Payer: BC Managed Care – PPO | Admitting: Internal Medicine

## 2019-11-20 ENCOUNTER — Other Ambulatory Visit: Payer: Self-pay | Admitting: Internal Medicine

## 2019-11-20 NOTE — Telephone Encounter (Signed)
I refilled the meloxicam x 1.  Overdue labs.  Needs labs.  Please schedule fasting lab appt.

## 2019-11-27 NOTE — Telephone Encounter (Signed)
Called and scheduled pt

## 2019-11-29 ENCOUNTER — Other Ambulatory Visit: Payer: Self-pay | Admitting: Internal Medicine

## 2019-12-17 ENCOUNTER — Other Ambulatory Visit: Payer: Self-pay | Admitting: Internal Medicine

## 2019-12-21 ENCOUNTER — Telehealth: Payer: Self-pay | Admitting: *Deleted

## 2019-12-21 ENCOUNTER — Other Ambulatory Visit (INDEPENDENT_AMBULATORY_CARE_PROVIDER_SITE_OTHER)

## 2019-12-21 ENCOUNTER — Other Ambulatory Visit: Payer: Self-pay

## 2019-12-21 DIAGNOSIS — I10 Essential (primary) hypertension: Secondary | ICD-10-CM | POA: Diagnosis not present

## 2019-12-21 DIAGNOSIS — R739 Hyperglycemia, unspecified: Secondary | ICD-10-CM

## 2019-12-21 DIAGNOSIS — K219 Gastro-esophageal reflux disease without esophagitis: Secondary | ICD-10-CM

## 2019-12-21 DIAGNOSIS — E7800 Pure hypercholesterolemia, unspecified: Secondary | ICD-10-CM

## 2019-12-21 LAB — HEPATIC FUNCTION PANEL
ALT: 11 U/L (ref 0–35)
AST: 22 U/L (ref 0–37)
Albumin: 4.6 g/dL (ref 3.5–5.2)
Alkaline Phosphatase: 110 U/L (ref 39–117)
Bilirubin, Direct: 0.1 mg/dL (ref 0.0–0.3)
Total Bilirubin: 0.6 mg/dL (ref 0.2–1.2)
Total Protein: 7.8 g/dL (ref 6.0–8.3)

## 2019-12-21 LAB — BASIC METABOLIC PANEL
BUN: 16 mg/dL (ref 6–23)
CO2: 26 mEq/L (ref 19–32)
Calcium: 9.7 mg/dL (ref 8.4–10.5)
Chloride: 100 mEq/L (ref 96–112)
Creatinine, Ser: 0.91 mg/dL (ref 0.40–1.20)
GFR: 62.26 mL/min (ref >60.00)
Glucose, Bld: 141 mg/dL — ABNORMAL HIGH (ref 70–99)
Potassium: 3.3 mEq/L — ABNORMAL LOW (ref 3.5–5.1)
Sodium: 138 mEq/L (ref 135–145)

## 2019-12-21 LAB — VITAMIN D 25 HYDROXY (VIT D DEFICIENCY, FRACTURES): VITD: 21.26 ng/mL — ABNORMAL LOW (ref 30.00–100.00)

## 2019-12-21 LAB — LDL CHOLESTEROL, DIRECT: Direct LDL: 198 mg/dL

## 2019-12-21 LAB — LIPID PANEL
Cholesterol: 266 mg/dL — ABNORMAL HIGH (ref 0–200)
HDL: 50.1 mg/dL (ref >39.00)
NonHDL: 215.5
Total CHOL/HDL Ratio: 5
Triglycerides: 229 mg/dL — ABNORMAL HIGH (ref 0.0–149.0)
VLDL: 45.8 mg/dL — ABNORMAL HIGH (ref 0.0–40.0)

## 2019-12-21 LAB — HEMOGLOBIN A1C: Hgb A1c MFr Bld: 6 % (ref 4.6–6.5)

## 2019-12-21 NOTE — Telephone Encounter (Signed)
Add on sheet faxed 

## 2019-12-21 NOTE — Telephone Encounter (Signed)
Pt had labs prior to CPE on Tuesday & would like to have a Vit D added. If agreeable, please place future order.  Thanks

## 2019-12-21 NOTE — Telephone Encounter (Signed)
I have placed the order for the vitamin D.  It did not flag for her to sign a waiver, but I am not sure that insurance will cover.  Please make sure ok to have drawn.

## 2019-12-25 ENCOUNTER — Other Ambulatory Visit: Payer: Self-pay

## 2019-12-25 ENCOUNTER — Other Ambulatory Visit: Payer: Self-pay | Admitting: Internal Medicine

## 2019-12-25 ENCOUNTER — Ambulatory Visit (INDEPENDENT_AMBULATORY_CARE_PROVIDER_SITE_OTHER): Admitting: Internal Medicine

## 2019-12-25 VITALS — BP 128/78 | HR 100 | Temp 95.4°F | Resp 16 | Ht 65.0 in | Wt 194.0 lb

## 2019-12-25 DIAGNOSIS — I700 Atherosclerosis of aorta: Secondary | ICD-10-CM

## 2019-12-25 DIAGNOSIS — Z713 Dietary counseling and surveillance: Secondary | ICD-10-CM

## 2019-12-25 DIAGNOSIS — E7800 Pure hypercholesterolemia, unspecified: Secondary | ICD-10-CM

## 2019-12-25 DIAGNOSIS — Z0000 Encounter for general adult medical examination without abnormal findings: Secondary | ICD-10-CM | POA: Diagnosis not present

## 2019-12-25 DIAGNOSIS — F419 Anxiety disorder, unspecified: Secondary | ICD-10-CM | POA: Diagnosis not present

## 2019-12-25 DIAGNOSIS — F32A Depression, unspecified: Secondary | ICD-10-CM

## 2019-12-25 DIAGNOSIS — R5383 Other fatigue: Secondary | ICD-10-CM

## 2019-12-25 DIAGNOSIS — I10 Essential (primary) hypertension: Secondary | ICD-10-CM

## 2019-12-25 DIAGNOSIS — R739 Hyperglycemia, unspecified: Secondary | ICD-10-CM

## 2019-12-25 DIAGNOSIS — F329 Major depressive disorder, single episode, unspecified: Secondary | ICD-10-CM

## 2019-12-25 DIAGNOSIS — K219 Gastro-esophageal reflux disease without esophagitis: Secondary | ICD-10-CM

## 2019-12-25 NOTE — Progress Notes (Signed)
Patient ID: Angel Riddle, female   DOB: 1956/01/20, 64 y.o.   MRN: [id number]   Subjective:    Patient ID: Angel Riddle, female    DOB: [birth year], 64 y.o.   MRN: [id number]  HPI  This visit occurred during the SARS-CoV-2 public health emergency.  Safety protocols were in place, including screening questions prior to the visit, additional usage of staff PPE, and extensive cleaning of exam room while observing appropriate contact time as indicated for disinfecting solutions.  Patient here for her physical exam.  Increased stress - family stress.  Sees Dr Nicolasa Ducking.  On cymbalta and prozac.  Feels needs something to have if needed.  Still has some xanax left over.  Discussed the need to discuss with Dr Nicolasa Ducking.  Tries to stay active.  No chest pain.  No sob.  No acid reflux.  No abdominal pain.  Bowels moving.  Increased fatigue.  Not sleeping well.  Daytime somnolence.  Discussed possible sleep apnea.  Discussed further evaluation. Discussed labs. Discussed elevated cholesterol.  Discussed changing treatment.  She declines.  She is interested in weight loss surgery. Discussed diet and exercise.  Request referral to Dr Lebron Quam.     Past Medical History:  Diagnosis Date  . Allergy   . Anxiety   . Arthritis   . Cancer (HCC)    basal cell nose  . Chronic back pain    degenerative disc disease  . Degenerative disc disease, lumbar   . Depression   . Dyspnea    with exertion / pain  . Gallstones   . GERD (gastroesophageal reflux disease)   . Heart murmur    followed by PCP  . Hx of colonic polyp   . Hx: UTI (urinary tract infection)   . Hypercholesterolemia   . Hypertension   . Panic attacks    H/O  . Wears dentures    partial upper and lower   Past Surgical History:  Procedure Laterality Date  . ABDOMINAL HYSTERECTOMY  1992   partial  . BACK SURGERY     lumbar fusion  . CATARACT EXTRACTION Bilateral 2010,2011  . CHOLECYSTECTOMY  04-19-14  . COLONOSCOPY  07/02/11   DR. Byrnett    . COLONOSCOPY WITH PROPOFOL N/A 09/25/2015   Procedure: COLONOSCOPY WITH PROPOFOL;  Surgeon: Lucilla Lame, MD;  Location: [place];  Service: Endoscopy;  Laterality: N/A;  . FOOT SURGERY Right 2009  . MOUTH SURGERY    . POLYPECTOMY  09/25/2015   Procedure: POLYPECTOMY;  Surgeon: Lucilla Lame, MD;  Location: [place];  Service: Endoscopy;;  . skin surgery Left 08/27/13   Basal cell removal-left nostril  . TONSILLECTOMY AND ADENOIDECTOMY  1969   Family History  Problem Relation Age of Onset  . Hyperlipidemia Mother   . Hypertension Mother   . Alcohol abuse Father   . Hyperlipidemia Father   . Heart disease Father   . Diabetes Father   . Hyperlipidemia Sister   . Lung cancer Brother   . Hyperlipidemia Brother   . Breast cancer Neg Hx    Social History   Socioeconomic History  . Marital status: Married    Spouse name: Not on file  . Number of children: 2  . Years of education: Not on file  . Highest education level: Not on file  Occupational History  . Not on file  Tobacco Use  . Smoking status: Former Smoker    Packs/day: 1.00    Years: 37.00  Pack years: 37.00    Quit date: 2012    Years since quitting: 9.0  . Smokeless tobacco: Never Used  Substance and Sexual Activity  . Alcohol use: Yes    Alcohol/week: 1.0 standard drinks    Types: 1 Glasses of wine per week  . Drug use: No  . Sexual activity: Not on file  Other Topics Concern  . Not on file  Social History Narrative  . Not on file   Social Determinants of Health   Financial Resource Strain:   . Difficulty of Paying Living Expenses: Not on file  Food Insecurity:   . Worried About Charity fundraiser in the Last Year: Not on file  . Ran Out of Food in the Last Year: Not on file  Transportation Needs:   . Lack of Transportation (Medical): Not on file  . Lack of Transportation (Non-Medical): Not on file  Physical Activity:   . Days of Exercise per Week: Not on file  . Minutes of  Exercise per Session: Not on file  Stress:   . Feeling of Stress : Not on file  Social Connections:   . Frequency of Communication with Friends and Family: Not on file  . Frequency of Social Gatherings with Friends and Family: Not on file  . Attends Religious Services: Not on file  . Active Member of Clubs or Organizations: Not on file  . Attends Archivist Meetings: Not on file  . Marital Status: Not on file    Outpatient Encounter Medications as of 12/25/2019  Medication Sig  . ALPRAZolam (XANAX) 1 MG tablet TAKE (1) TABLET TWICE A DAY. (Patient not taking: Reported on 07/02/2019)  . DULoxetine (CYMBALTA) 60 MG capsule Take 1 capsule by mouth daily.  Marland Kitchen ezetimibe (ZETIA) 10 MG tablet Take 1 tablet (10 mg total) by mouth daily.  Marland Kitchen FLUoxetine (PROZAC) 40 MG capsule Take 1 capsule (40 mg total) by mouth daily.  . fluticasone (FLONASE) 50 MCG/ACT nasal spray Place 1 spray into both nostrils daily as needed for allergies. Reported on 11/25/2015  . hydrochlorothiazide (MICROZIDE) 12.5 MG capsule TAKE ONE CAPSULE DAILY.  Marland Kitchen HYDROcodone-acetaminophen (NORCO) 7.5-325 MG tablet   . hydrocortisone (ANUSOL-HC) 25 MG suppository Place 1 suppository (25 mg total) rectally 2 (two) times daily.  Marland Kitchen loratadine (CLARITIN) 10 MG tablet Take 10 mg by mouth daily as needed for allergies.  . meloxicam (MOBIC) 15 MG tablet Take 1 tablet (15 mg total) by mouth daily.  Marland Kitchen omeprazole (PRILOSEC) 40 MG capsule Take 40 mg by mouth daily.   . potassium chloride (K-DUR) 10 MEQ tablet Take 1 tablet (10 mEq total) by mouth daily.  . pravastatin (PRAVACHOL) 10 MG tablet Take 1 tablet (10 mg total) by mouth daily.  . promethazine (PHENERGAN) 12.5 MG tablet Take 1 tablet (12.5 mg total) by mouth 2 (two) times daily as needed for nausea or vomiting. (Patient not taking: Reported on 07/02/2019)  . tiZANidine (ZANAFLEX) 4 MG tablet Take 2-4 mg by mouth 3 times daily as needed for muscle spasms  . traZODone (DESYREL) 50  MG tablet Take 2 tablets by mouth at bedtime.  . [DISCONTINUED] amoxicillin-clavulanate (AUGMENTIN) 875-125 MG tablet Take 1 tablet by mouth 2 (two) times daily.  . [DISCONTINUED] nitrofurantoin, macrocrystal-monohydrate, (MACROBID) 100 MG capsule Take 1 capsule (100 mg total) by mouth 2 (two) times daily. With food  . [DISCONTINUED] oseltamivir (TAMIFLU) 75 MG capsule Take 1 capsule (75 mg total) by mouth 2 (two) times daily. (Patient not  taking: Reported on 07/02/2019)  . [DISCONTINUED] potassium chloride (K-DUR,KLOR-CON) 10 MEQ tablet Take 1 tablet (10 mEq total) by mouth daily.  . [DISCONTINUED] predniSONE (DELTASONE) 10 MG tablet 6 tablets on Day 1 , then reduce by 1 tablet daily until gone (Patient not taking: Reported on 07/02/2019)   No facility-administered encounter medications on file as of 12/25/2019.   Review of Systems  Constitutional: Negative for appetite change and unexpected weight change.  HENT: Negative for congestion and sinus pressure.   Eyes: Negative for pain and visual disturbance.  Respiratory: Negative for cough, chest tightness and shortness of breath.   Cardiovascular: Negative for chest pain, palpitations and leg swelling.  Gastrointestinal: Negative for abdominal pain, diarrhea and nausea.  Genitourinary: Negative for difficulty urinating and dysuria.  Musculoskeletal: Negative for joint swelling and myalgias.  Skin: Negative for color change and rash.  Neurological: Negative for dizziness, light-headedness and headaches.  Hematological: Negative for adenopathy. Does not bruise/bleed easily.  Psychiatric/Behavioral: Negative for agitation and dysphoric mood.       Increased stress as outlined.         Objective:    Physical Exam Constitutional:      General: She is not in acute distress.    Appearance: Normal appearance. She is well-developed.  HENT:     Head: Normocephalic and atraumatic.     Right Ear: External ear normal.     Left Ear: External ear  normal.  Eyes:     General: No scleral icterus.       Right eye: No discharge.        Left eye: No discharge.     Conjunctiva/sclera: Conjunctivae normal.  Neck:     Thyroid: No thyromegaly.  Cardiovascular:     Rate and Rhythm: Normal rate and regular rhythm.  Pulmonary:     Effort: No tachypnea, accessory muscle usage or respiratory distress.     Breath sounds: Normal breath sounds. No decreased breath sounds or wheezing.  Chest:     Breasts:        Right: No inverted nipple, mass, nipple discharge or tenderness (no axillary adenopathy).        Left: No inverted nipple, mass, nipple discharge or tenderness (no axilarry adenopathy).  Abdominal:     General: Bowel sounds are normal.     Palpations: Abdomen is soft.     Tenderness: There is no abdominal tenderness.  Musculoskeletal:        General: No swelling or tenderness.     Cervical back: Neck supple. No tenderness.  Lymphadenopathy:     Cervical: No cervical adenopathy.  Skin:    Findings: No erythema or rash.  Neurological:     Mental Status: She is alert and oriented to person, place, and time.  Psychiatric:        Mood and Affect: Mood normal.        Behavior: Behavior normal.     BP 128/78   Pulse 100   Temp (!) 95.4 F (35.2 C)   Resp 16   Ht '5\' 5"'  (1.651 m)   Wt 194 lb (88 kg)   SpO2 97%   BMI 32.28 kg/m  Wt Readings from Last 3 Encounters:  12/25/19 194 lb (88 kg)  02/08/19 194 lb 12.8 oz (88.4 kg)  01/31/19 190 lb (86.2 kg)     Lab Results  Component Value Date   WBC 6.7 02/07/2019   HGB 13.4 02/07/2019   HCT 39.2 02/07/2019   PLT 320.0  02/07/2019   GLUCOSE 141 (H) 12/21/2019   CHOL 266 (H) 12/21/2019   TRIG 229.0 (H) 12/21/2019   HDL 50.10 12/21/2019   LDLDIRECT 198.0 12/21/2019   LDLCALC 117 (H) 02/07/2019   ALT 11 12/21/2019   AST 22 12/21/2019   NA 138 12/21/2019   K 3.3 (L) 12/21/2019   CL 100 12/21/2019   CREATININE 0.91 12/21/2019   BUN 16 12/21/2019   CO2 26 12/21/2019    TSH 0.88 02/07/2019   HGBA1C 6.0 12/21/2019    CT CHEST LUNG CA SCREEN LOW DOSE W/O CM  Result Date: 01/31/2019 CLINICAL DATA:  64 year old female former smoker, quit 7 years ago, with 37 pack-year history of smoking, for follow-up lung cancer screening EXAM: CT CHEST WITHOUT CONTRAST LOW-DOSE FOR LUNG CANCER SCREENING TECHNIQUE: Multidetector CT imaging of the chest was performed following the standard protocol without IV contrast. COMPARISON:  Low-dose lung cancer screening CT chest dated 12/26/2017 FINDINGS: Cardiovascular: The heart is normal in size. No pericardial effusion. No evidence of thoracic aortic aneurysm. Mild atherosclerotic calcifications of the aortic arch. Mediastinum/Nodes: No suspicious mediastinal lymphadenopathy. Visualized thyroid is unremarkable. Lungs/Pleura: Mild centrilobular emphysematous changes, upper lobe predominant. No focal consolidation. Mild lingular scarring. 1.9 mm subpleural nodule in the left upper lobe. No pleural effusion or pneumothorax. Upper Abdomen: Visualized upper abdomen is notable for prior cholecystectomy. Musculoskeletal: Mild degenerative changes of the visualized thoracolumbar spine. IMPRESSION: Lung-RADS 2, benign appearance or behavior. Continue annual screening with low-dose chest CT without contrast in 12 months. Aortic Atherosclerosis (ICD10-I70.0) and Emphysema (ICD10-J43.9). Electronically Signed   By: Julian Hy M.D.   On: 01/31/2019 15:15       Assessment & Plan:   Problem List Items Addressed This Visit    Anxiety    Increased stress and anxiety as outlined.  On cymbalta and prozac.  Feels needs something to have to take if needed.  Discussed that she needs to discuss with Dr Nicolasa Ducking.  Follow.        Aortic atherosclerosis (HCC)    On pravastatin.        Depression    Followed by Dr Nicolasa Ducking.  On cymbalta and prozac.  Continue f/u with Dr Nicolasa Ducking.        Fatigue    Daytime somnolence, fatigue, etc.  Discussed further  evaluation for possible sleep apnea.  She is agreeable.  Refer to pulmonary.        Relevant Orders   Ambulatory referral to Pulmonology   GERD (gastroesophageal reflux disease)    Controlled.       Health care maintenance    Physical today 12/25/19.  Mammogram 07/25/18 - Birads I.  Schedule f/u mammogram.  colonnoscopy 09/2015.  Recommend f/u in 5 years.        Hypercholesterolemia    On pravastatin.  Low cholesterol diet and exercise.  Desires no change in medication.  Follow lipid panel and liver function tests.        Relevant Orders   Hepatic function panel   Lipid panel   Hyperglycemia    Low carb diet and exercise.  Follow met b and a1c.       Relevant Orders   Hemoglobin A1c   Hypertension    Blood pressure under good control.  Continue same medication regimen.  Follow pressures.  Follow metabolic panel.        Relevant Orders   CBC with Differential/Platelet   TSH   Basic metabolic panel   Weight loss counseling, encounter  for    Desires referral to discuss weight loss surgery.  Discussed diet and exercise.  Request referral to Dr Lebron Quam.        Relevant Orders   Ambulatory referral to General Surgery    Other Visit Diagnoses    Routine general medical examination at a health care facility    -  Primary       Einar Pheasant, MD

## 2019-12-30 ENCOUNTER — Encounter: Payer: Self-pay | Admitting: Internal Medicine

## 2019-12-30 DIAGNOSIS — R5383 Other fatigue: Secondary | ICD-10-CM | POA: Insufficient documentation

## 2019-12-30 DIAGNOSIS — Z713 Dietary counseling and surveillance: Secondary | ICD-10-CM | POA: Insufficient documentation

## 2019-12-30 NOTE — Assessment & Plan Note (Signed)
On pravastatin.   

## 2019-12-30 NOTE — Assessment & Plan Note (Signed)
Controlled.  

## 2019-12-30 NOTE — Assessment & Plan Note (Signed)
Daytime somnolence, fatigue, etc.  Discussed further evaluation for possible sleep apnea.  She is agreeable.  Refer to pulmonary.

## 2019-12-30 NOTE — Assessment & Plan Note (Signed)
Physical today 12/25/19.  Mammogram 07/25/18 - Birads I.  Schedule f/u mammogram.  colonnoscopy 09/2015.  Recommend f/u in 5 years.

## 2019-12-30 NOTE — Assessment & Plan Note (Signed)
Blood pressure under good control.  Continue same medication regimen.  Follow pressures.  Follow metabolic panel.   

## 2019-12-30 NOTE — Assessment & Plan Note (Signed)
Followed by Dr Nicolasa Ducking.  On cymbalta and prozac.  Continue f/u with Dr Nicolasa Ducking.

## 2019-12-30 NOTE — Assessment & Plan Note (Signed)
Increased stress and anxiety as outlined.  On cymbalta and prozac.  Feels needs something to have to take if needed.  Discussed that she needs to discuss with Dr Nicolasa Ducking.  Follow.

## 2019-12-30 NOTE — Assessment & Plan Note (Signed)
On pravastatin.  Low cholesterol diet and exercise.  Desires no change in medication.  Follow lipid panel and liver function tests.

## 2019-12-30 NOTE — Assessment & Plan Note (Signed)
Desires referral to discuss weight loss surgery.  Discussed diet and exercise.  Request referral to Dr Lebron Quam.

## 2019-12-30 NOTE — Assessment & Plan Note (Signed)
Low carb diet and exercise.  Follow met b and a1c.  

## 2020-01-16 ENCOUNTER — Institutional Professional Consult (permissible substitution): Admitting: Pulmonary Disease

## 2020-01-17 ENCOUNTER — Other Ambulatory Visit: Payer: Self-pay | Admitting: Internal Medicine

## 2020-01-21 ENCOUNTER — Telehealth: Payer: Self-pay | Admitting: Internal Medicine

## 2020-01-21 ENCOUNTER — Telehealth: Payer: Self-pay | Admitting: *Deleted

## 2020-01-21 DIAGNOSIS — Z87891 Personal history of nicotine dependence: Secondary | ICD-10-CM

## 2020-01-21 NOTE — Telephone Encounter (Signed)
FYI-pt would like 60 day supply on all medications and to please refill potassium chloride (KLOR-CON) 10 MEQ tablet.

## 2020-01-21 NOTE — Telephone Encounter (Signed)
Patient has been notified that annual lung cancer screening low dose CT scan is due currently or will be in near future. Confirmed that patient is within the age range of 55-77, and asymptomatic, (no signs or symptoms of lung cancer). Patient denies illness that would prevent curative treatment for lung cancer if found. Verified smoking history, (current, 37.25 pack year). The shared decision making visit was done 12/14/16. Patient is agreeable for CT scan being scheduled.

## 2020-01-22 ENCOUNTER — Other Ambulatory Visit: Payer: Self-pay

## 2020-01-22 ENCOUNTER — Telehealth: Payer: Self-pay | Admitting: *Deleted

## 2020-01-22 MED ORDER — MELOXICAM 15 MG PO TABS: ORAL_TABLET | ORAL | 2 refills | Status: DC

## 2020-01-22 MED ORDER — EZETIMIBE 10 MG PO TABS: 10.0000 mg | ORAL_TABLET | Freq: Every day | ORAL | 2 refills | Status: DC

## 2020-01-22 MED ORDER — PRAVASTATIN SODIUM 10 MG PO TABS: 10.0000 mg | ORAL_TABLET | Freq: Every day | ORAL | 2 refills | Status: DC

## 2020-01-22 MED ORDER — POTASSIUM CHLORIDE ER 10 MEQ PO TBCR: 10.0000 meq | EXTENDED_RELEASE_TABLET | Freq: Every day | ORAL | 2 refills | Status: DC

## 2020-01-22 NOTE — Telephone Encounter (Signed)
Medication refilled

## 2020-01-22 NOTE — Telephone Encounter (Signed)
(  01/22/2020) Left message for patient to notify them that it is time to schedule annual low dose lung cancer screening CT scan. Instructed patient to call back to verify information prior to the scan being scheduled °SRW °  ° ° °

## 2020-02-01 ENCOUNTER — Ambulatory Visit
Admission: RE | Admit: 2020-02-01 | Discharge: 2020-02-01 | Disposition: A | Discharge status: Home / Self Care | Payer: BC Managed Care – PPO | Source: Ambulatory Visit | Attending: Oncology | Admitting: Oncology

## 2020-02-01 ENCOUNTER — Other Ambulatory Visit: Payer: Self-pay

## 2020-02-01 DIAGNOSIS — Z87891 Personal history of nicotine dependence: Secondary | ICD-10-CM

## 2020-02-06 ENCOUNTER — Encounter: Payer: Self-pay | Admitting: *Deleted

## 2020-03-10 ENCOUNTER — Ambulatory Visit
Admission: RE | Admit: 2020-03-10 | Discharge: 2020-03-10 | Disposition: A | Discharge status: Home / Self Care | Payer: BC Managed Care – PPO | Source: Ambulatory Visit | Attending: Internal Medicine | Admitting: Internal Medicine

## 2020-03-10 DIAGNOSIS — Z1231 Encounter for screening mammogram for malignant neoplasm of breast: Secondary | ICD-10-CM | POA: Insufficient documentation

## 2020-03-10 DIAGNOSIS — Z1239 Encounter for other screening for malignant neoplasm of breast: Secondary | ICD-10-CM

## 2020-03-17 ENCOUNTER — Other Ambulatory Visit: Payer: Self-pay | Admitting: Internal Medicine

## 2020-03-27 ENCOUNTER — Institutional Professional Consult (permissible substitution): Admitting: Pulmonary Disease

## 2020-03-27 ENCOUNTER — Other Ambulatory Visit

## 2020-03-31 ENCOUNTER — Other Ambulatory Visit: Payer: Self-pay

## 2020-03-31 ENCOUNTER — Other Ambulatory Visit (INDEPENDENT_AMBULATORY_CARE_PROVIDER_SITE_OTHER): Payer: BC Managed Care – PPO

## 2020-03-31 DIAGNOSIS — R739 Hyperglycemia, unspecified: Secondary | ICD-10-CM | POA: Diagnosis not present

## 2020-03-31 DIAGNOSIS — I10 Essential (primary) hypertension: Secondary | ICD-10-CM | POA: Diagnosis not present

## 2020-03-31 DIAGNOSIS — E7800 Pure hypercholesterolemia, unspecified: Secondary | ICD-10-CM

## 2020-03-31 LAB — LIPID PANEL
Cholesterol: 254 mg/dL — ABNORMAL HIGH (ref 0–200)
HDL: 46 mg/dL (ref >39.00)
NonHDL: 207.62
Total CHOL/HDL Ratio: 6
Triglycerides: 245 mg/dL — ABNORMAL HIGH (ref 0.0–149.0)
VLDL: 49 mg/dL — ABNORMAL HIGH (ref 0.0–40.0)

## 2020-03-31 LAB — BASIC METABOLIC PANEL
BUN: 16 mg/dL (ref 6–23)
CO2: 29 mEq/L (ref 19–32)
Calcium: 9.2 mg/dL (ref 8.4–10.5)
Chloride: 102 mEq/L (ref 96–112)
Creatinine, Ser: 0.84 mg/dL (ref 0.40–1.20)
GFR: 68.23 mL/min (ref >60.00)
Glucose, Bld: 97 mg/dL (ref 70–99)
Potassium: 3.6 mEq/L (ref 3.5–5.1)
Sodium: 139 mEq/L (ref 135–145)

## 2020-03-31 LAB — CBC WITH DIFFERENTIAL/PLATELET
Basophils Absolute: 0.1 10*3/uL (ref 0.0–0.1)
Basophils Relative: 0.7 % (ref 0.0–3.0)
Eosinophils Absolute: 0.2 10*3/uL (ref 0.0–0.7)
Eosinophils Relative: 1.8 % (ref 0.0–5.0)
HCT: 41.9 % (ref 36.0–46.0)
Hemoglobin: 14 g/dL (ref 12.0–15.0)
Lymphocytes Relative: 30.5 % (ref 12.0–46.0)
Lymphs Abs: 2.8 10*3/uL (ref 0.7–4.0)
MCHC: 33.4 g/dL (ref 30.0–36.0)
MCV: 87.9 fl (ref 78.0–100.0)
Monocytes Absolute: 0.6 10*3/uL (ref 0.1–1.0)
Monocytes Relative: 6.9 % (ref 3.0–12.0)
Neutro Abs: 5.5 10*3/uL (ref 1.4–7.7)
Neutrophils Relative %: 60.1 % (ref 43.0–77.0)
Platelets: 360 10*3/uL (ref 150.0–400.0)
RBC: 4.76 Mil/uL (ref 3.87–5.11)
RDW: 13.7 % (ref 11.5–15.5)
WBC: 9.1 10*3/uL (ref 4.0–10.5)

## 2020-03-31 LAB — HEPATIC FUNCTION PANEL
ALT: 12 U/L (ref 0–35)
AST: 23 U/L (ref 0–37)
Albumin: 4.2 g/dL (ref 3.5–5.2)
Alkaline Phosphatase: 98 U/L (ref 39–117)
Bilirubin, Direct: 0.1 mg/dL (ref 0.0–0.3)
Total Bilirubin: 0.5 mg/dL (ref 0.2–1.2)
Total Protein: 7.1 g/dL (ref 6.0–8.3)

## 2020-03-31 LAB — HEMOGLOBIN A1C: Hgb A1c MFr Bld: 6 % (ref 4.6–6.5)

## 2020-03-31 LAB — LDL CHOLESTEROL, DIRECT: Direct LDL: 170 mg/dL

## 2020-03-31 LAB — TSH: TSH: 1.18 u[IU]/mL (ref 0.35–4.50)

## 2020-04-01 ENCOUNTER — Encounter: Payer: Self-pay | Admitting: Internal Medicine

## 2020-04-01 ENCOUNTER — Ambulatory Visit (INDEPENDENT_AMBULATORY_CARE_PROVIDER_SITE_OTHER): Payer: BC Managed Care – PPO | Admitting: Internal Medicine

## 2020-04-01 DIAGNOSIS — E7800 Pure hypercholesterolemia, unspecified: Secondary | ICD-10-CM | POA: Diagnosis not present

## 2020-04-01 DIAGNOSIS — I10 Essential (primary) hypertension: Secondary | ICD-10-CM | POA: Diagnosis not present

## 2020-04-01 DIAGNOSIS — G8929 Other chronic pain: Secondary | ICD-10-CM

## 2020-04-01 DIAGNOSIS — I700 Atherosclerosis of aorta: Secondary | ICD-10-CM

## 2020-04-01 DIAGNOSIS — K219 Gastro-esophageal reflux disease without esophagitis: Secondary | ICD-10-CM

## 2020-04-01 DIAGNOSIS — R739 Hyperglycemia, unspecified: Secondary | ICD-10-CM | POA: Diagnosis not present

## 2020-04-01 DIAGNOSIS — M549 Dorsalgia, unspecified: Secondary | ICD-10-CM

## 2020-04-01 DIAGNOSIS — Z86010 Personal history of colonic polyps: Secondary | ICD-10-CM

## 2020-04-01 DIAGNOSIS — F32A Depression, unspecified: Secondary | ICD-10-CM

## 2020-04-01 DIAGNOSIS — F329 Major depressive disorder, single episode, unspecified: Secondary | ICD-10-CM

## 2020-04-01 MED ORDER — EZETIMIBE 10 MG PO TABS: 10.0000 mg | ORAL_TABLET | Freq: Every day | ORAL | 1 refills | Status: DC

## 2020-04-01 MED ORDER — HYDROCHLOROTHIAZIDE 12.5 MG PO CAPS: 12.5000 mg | ORAL_CAPSULE | Freq: Every day | ORAL | 1 refills | Status: DC

## 2020-04-01 MED ORDER — FLUOXETINE HCL 40 MG PO CAPS: ORAL_CAPSULE | ORAL | 1 refills | Status: DC

## 2020-04-01 MED ORDER — POTASSIUM CHLORIDE ER 10 MEQ PO TBCR: 10.0000 meq | EXTENDED_RELEASE_TABLET | Freq: Every day | ORAL | 1 refills | Status: DC

## 2020-04-01 MED ORDER — MELOXICAM 15 MG PO TABS: ORAL_TABLET | ORAL | 1 refills | Status: DC

## 2020-04-01 MED ORDER — PRAVASTATIN SODIUM 10 MG PO TABS: 10.0000 mg | ORAL_TABLET | Freq: Every day | ORAL | 1 refills | Status: DC

## 2020-04-01 NOTE — Progress Notes (Signed)
Patient ID: Angel Riddle, female   DOB: 05/15/1956, 64 y.o.   MRN: [id number]   Subjective:    Patient ID: Angel Riddle, female    DOB: 12/22/1955, 64 y.o.   MRN: [id number]  HPI This visit occurred during the SARS-CoV-2 public health emergency.  Safety protocols were in place, including screening questions prior to the visit, additional usage of staff PPE, and extensive cleaning of exam room while observing appropriate contact time as indicated for disinfecting solutions.  Patient here for a scheduled follow up. She reports she is doing relatively well.  Is having increased pain in her hips and legs. notices pain when she gets up.  Feels like left leg is going to give away.  Notices when rolls over - leg grabs.  Taking some left over hydrocodone.   Is s/p lumbar fusion - two years ago.  Saw neurosurgery 12/2019.  Was having some mild radicular symptoms then,   It appears she is having increased pain.  [facility] had noted that if symptoms increased - to notify, may need f/u lumbar MRI.  Discussed with her.  She plans to f/u with [facility].  Handling stress.  No chest pain or sob reported.  No abdominal pain.  Bowels stable.  Still seeing psychiatry.  On prozac and cymbalta.    Past Medical History:  Diagnosis Date  . Allergy   . Anxiety   . Arthritis   . Cancer (HCC)    basal cell nose  . Chronic back pain    degenerative disc disease  . Degenerative disc disease, lumbar   . Depression   . Dyspnea    with exertion / pain  . Gallstones   . GERD (gastroesophageal reflux disease)   . Heart murmur    followed by PCP  . Hx of colonic polyp   . Hx: UTI (urinary tract infection)   . Hypercholesterolemia   . Hypertension   . Panic attacks    H/O  . Wears dentures    partial upper and lower   Past Surgical History:  Procedure Laterality Date  . ABDOMINAL HYSTERECTOMY  1992   partial  . BACK SURGERY     lumbar fusion  . CATARACT EXTRACTION Bilateral 2010,2011  . CHOLECYSTECTOMY  04-19-14  .  COLONOSCOPY  07/02/11   DR. Byrnett  . COLONOSCOPY WITH PROPOFOL N/A 09/25/2015   Procedure: COLONOSCOPY WITH PROPOFOL;  Surgeon: Darren Wohl, MD;  Location: [facility];  Service: Endoscopy;  Laterality: N/A;  . FOOT SURGERY Right 2009  . MOUTH SURGERY    . POLYPECTOMY  09/25/2015   Procedure: POLYPECTOMY;  Surgeon: Darren Wohl, MD;  Location: [facility];  Service: Endoscopy;;  . skin surgery Left 08/27/13   Basal cell removal-left nostril  . TONSILLECTOMY AND ADENOIDECTOMY  1969   Family History  Problem Relation Age of Onset  . Hyperlipidemia Mother   . Hypertension Mother   . Alcohol abuse Father   . Hyperlipidemia Father   . Heart disease Father   . Diabetes Father   . Hyperlipidemia Sister   . Lung cancer Brother   . Hyperlipidemia Brother   . Breast cancer Neg Hx    Social History   Socioeconomic History  . Marital status: Married    Spouse name: Not on file  . Number of children: 2  . Years of education: Not on file  . Highest education level: Not on file  Occupational History  . Not on file  Tobacco Use  .   Smoking status: Former Smoker    Packs/day: 1.00    Years: 37.00    Pack years: 37.00    Quit date: 2012    Years since quitting: 9.3  . Smokeless tobacco: Never Used  Substance and Sexual Activity  . Alcohol use: Yes    Alcohol/week: 1.0 standard drinks    Types: 1 Glasses of wine per week  . Drug use: No  . Sexual activity: Not on file  Other Topics Concern  . Not on file  Social History Narrative  . Not on file   Social Determinants of Health   Financial Resource Strain:   . Difficulty of Paying Living Expenses:   Food Insecurity:   . Worried About Charity fundraiser in the Last Year:   . Arboriculturist in the Last Year:   Transportation Needs:   . Film/video editor (Medical):   Marland Kitchen Lack of Transportation (Non-Medical):   Physical Activity:   . Days of Exercise per Week:   . Minutes of Exercise per Session:   Stress:    . Feeling of Stress :   Social Connections:   . Frequency of Communication with Friends and Family:   . Frequency of Social Gatherings with Friends and Family:   . Attends Religious Services:   . Active Member of Clubs or Organizations:   . Attends Archivist Meetings:   Marland Kitchen Marital Status:     Outpatient Encounter Medications as of 04/01/2020  Medication Sig  . ALPRAZolam (XANAX) 1 MG tablet TAKE (1) TABLET TWICE A DAY.  Marland Kitchen amoxicillin (AMOXIL) 500 MG capsule Take 500 mg by mouth 3 (three) times daily.  . DULoxetine (CYMBALTA) 60 MG capsule Take 1 capsule by mouth daily.  Marland Kitchen ezetimibe (ZETIA) 10 MG tablet Take 1 tablet (10 mg total) by mouth daily.  Marland Kitchen FLUoxetine (PROZAC) 40 MG capsule Take 1 capsule (40 mg total) by mouth daily.  . fluticasone (FLONASE) 50 MCG/ACT nasal spray Place 1 spray into both nostrils daily as needed for allergies. Reported on 11/25/2015  . hydrochlorothiazide (MICROZIDE) 12.5 MG capsule Take 1 capsule (12.5 mg total) by mouth daily.  Marland Kitchen HYDROcodone-acetaminophen (NORCO) 7.5-325 MG tablet   . hydrocortisone (ANUSOL-HC) 25 MG suppository Place 1 suppository (25 mg total) rectally 2 (two) times daily.  Marland Kitchen loratadine (CLARITIN) 10 MG tablet Take 10 mg by mouth daily as needed for allergies.  . meloxicam (MOBIC) 15 MG tablet Take 1 tablet (15 mg total) by mouth daily.  Marland Kitchen omeprazole (PRILOSEC) 40 MG capsule Take 40 mg by mouth daily.   . potassium chloride (KLOR-CON) 10 MEQ tablet Take 1 tablet (10 mEq total) by mouth daily.  . pravastatin (PRAVACHOL) 10 MG tablet Take 1 tablet (10 mg total) by mouth daily.  . promethazine (PHENERGAN) 12.5 MG tablet Take 1 tablet (12.5 mg total) by mouth 2 (two) times daily as needed for nausea or vomiting.  Marland Kitchen tiZANidine (ZANAFLEX) 4 MG tablet Take 2-4 mg by mouth 3 times daily as needed for muscle spasms  . traZODone (DESYREL) 50 MG tablet Take 2 tablets by mouth at bedtime.  . [DISCONTINUED] ezetimibe (ZETIA) 10 MG tablet Take  1 tablet (10 mg total) by mouth daily.  . [DISCONTINUED] FLUoxetine (PROZAC) 40 MG capsule Take 1 capsule (40 mg total) by mouth daily.  . [DISCONTINUED] hydrochlorothiazide (MICROZIDE) 12.5 MG capsule TAKE ONE CAPSULE DAILY.  . [DISCONTINUED] meloxicam (MOBIC) 15 MG tablet Take 1 tablet (15 mg total) by mouth daily.  . [  DISCONTINUED] potassium chloride (KLOR-CON) 10 MEQ tablet Take 1 tablet (10 mEq total) by mouth daily.  . [DISCONTINUED] pravastatin (PRAVACHOL) 10 MG tablet Take 1 tablet (10 mg total) by mouth daily.   No facility-administered encounter medications on file as of 04/01/2020.    Review of Systems  Constitutional: Negative for appetite change and unexpected weight change.  HENT: Negative for congestion and sinus pressure.   Respiratory: Negative for cough, chest tightness and shortness of breath.   Cardiovascular: Negative for chest pain, palpitations and leg swelling.  Gastrointestinal: Negative for abdominal pain, diarrhea, nausea and vomiting.  Genitourinary: Negative for difficulty urinating and dysuria.  Musculoskeletal: Negative for joint swelling and myalgias.       Back - leg pain as outlined.    Skin: Negative for color change and rash.  Neurological: Negative for dizziness, light-headedness and headaches.  Psychiatric/Behavioral: Negative for agitation and dysphoric mood.       Objective:    Physical Exam Constitutional:      General: She is not in acute distress.    Appearance: Normal appearance.  HENT:     Head: Normocephalic and atraumatic.     Right Ear: External ear normal.     Left Ear: External ear normal.  Eyes:     General: No scleral icterus.       Right eye: No discharge.        Left eye: No discharge.     Conjunctiva/sclera: Conjunctivae normal.  Neck:     Thyroid: No thyromegaly.  Cardiovascular:     Rate and Rhythm: Normal rate and regular rhythm.  Pulmonary:     Effort: No respiratory distress.     Breath sounds: Normal breath  sounds. No wheezing.  Abdominal:     General: Bowel sounds are normal.     Palpations: Abdomen is soft.     Tenderness: There is no abdominal tenderness.  Musculoskeletal:        General: No swelling or tenderness.     Cervical back: Neck supple. No tenderness.  Lymphadenopathy:     Cervical: No cervical adenopathy.  Skin:    Findings: No erythema or rash.  Neurological:     Mental Status: She is alert.  Psychiatric:        Mood and Affect: Mood normal.        Behavior: Behavior normal.     BP 140/88 (BP Location: Left Arm, Patient Position: Sitting, Cuff Size: Small)   Pulse 87   Temp 97.7 F (36.5 C) (Skin)   Ht 5' 5" (1.651 m)   Wt 189 lb 9.6 oz (86 kg)   SpO2 99%   BMI 31.55 kg/m  Wt Readings from Last 3 Encounters:  04/01/20 189 lb 9.6 oz (86 kg)  02/01/20 194 lb (88 kg)  12/25/19 194 lb (88 kg)     Lab Results  Component Value Date   WBC 9.1 03/31/2020   HGB 14.0 03/31/2020   HCT 41.9 03/31/2020   PLT 360.0 03/31/2020   GLUCOSE 97 03/31/2020   CHOL 254 (H) 03/31/2020   TRIG 245.0 (H) 03/31/2020   HDL 46.00 03/31/2020   LDLDIRECT 170.0 03/31/2020   LDLCALC 117 (H) 02/07/2019   ALT 12 03/31/2020   AST 23 03/31/2020   NA 139 03/31/2020   K 3.6 03/31/2020   CL 102 03/31/2020   CREATININE 0.84 03/31/2020   BUN 16 03/31/2020   CO2 29 03/31/2020   TSH 1.18 03/31/2020   HGBA1C 6.0 03/31/2020      MM 3D SCREEN BREAST BILATERAL  Result Date: 03/10/2020 CLINICAL DATA:  Screening. EXAM: DIGITAL SCREENING BILATERAL MAMMOGRAM WITH TOMO AND CAD COMPARISON:  Previous exam(s). ACR Breast Density Category b: There are scattered areas of fibroglandular density. FINDINGS: There are no findings suspicious for malignancy. Images were processed with CAD. IMPRESSION: No mammographic evidence of malignancy. A result letter of this screening mammogram will be mailed directly to the patient. RECOMMENDATION: Screening mammogram in one year. (Code:SM-B-01Y) BI-RADS CATEGORY  1:  Negative. Electronically Signed   By: Lillia Mountain M.D.   On: 03/10/2020 11:35       Assessment & Plan:   Problem List Items Addressed This Visit    Aortic atherosclerosis ([place])    On pravastatin.        Relevant Medications   ezetimibe (ZETIA) 10 MG tablet   hydrochlorothiazide (MICROZIDE) 12.5 MG capsule   pravastatin (PRAVACHOL) 10 MG tablet   Chronic back pain    S/p surgery.  Increased pain - leg pain as outlined.  Saw [facility].  Was stable at visit.  Now with increased symptoms.  Plans f/u with [facility] - for question of need for f/u MRI.       Relevant Medications   FLUoxetine (PROZAC) 40 MG capsule   meloxicam (MOBIC) 15 MG tablet   Depression    Followed by Dr Nicolasa Ducking.  On cymbalta and prozac.  Stable.        Relevant Medications   FLUoxetine (PROZAC) 40 MG capsule   GERD (gastroesophageal reflux disease)    Controlled on prilosec.        History of colonic polyps    colonoscopy 09/2015 -  Sigmoid polyp and fissure and no bleeding internal hemorrhoids.  Recommended f/u colonoscopy in 5 years.        Hypercholesterolemia    On pravastatin.  Low cholesterol diet and exercise.  Follow lipid panel and liver function tests.        Relevant Medications   ezetimibe (ZETIA) 10 MG tablet   hydrochlorothiazide (MICROZIDE) 12.5 MG capsule   pravastatin (PRAVACHOL) 10 MG tablet   Other Relevant Orders   Hepatic function panel   Lipid panel   Hyperglycemia    Low carb diet and exercise.  Follow met b and a1c.       Relevant Orders   Hemoglobin A1c   Hypertension    Blood pressure on recheck improved.  Continue to follow pressures.  Follow metabolic panel.         Relevant Medications   ezetimibe (ZETIA) 10 MG tablet   hydrochlorothiazide (MICROZIDE) 12.5 MG capsule   pravastatin (PRAVACHOL) 10 MG tablet   Other Relevant Orders   Basic metabolic panel       Einar Pheasant, MD

## 2020-04-06 ENCOUNTER — Telehealth: Payer: Self-pay | Admitting: Internal Medicine

## 2020-04-06 ENCOUNTER — Encounter: Payer: Self-pay | Admitting: Internal Medicine

## 2020-04-06 NOTE — Assessment & Plan Note (Signed)
Controlled on prilosec.   

## 2020-04-06 NOTE — Assessment & Plan Note (Signed)
On pravastatin.  Low cholesterol diet and exercise.  Follow lipid panel and liver function tests.   

## 2020-04-06 NOTE — Assessment & Plan Note (Signed)
Low carb diet and exercise.  Follow met b and a1c.  

## 2020-04-06 NOTE — Assessment & Plan Note (Signed)
Blood pressure on recheck improved.  Continue to follow pressures.  Follow metabolic panel.

## 2020-04-06 NOTE — Assessment & Plan Note (Signed)
S/p surgery.  Increased pain - leg pain as outlined.  Saw [facility].  Was stable at visit.  Now with increased symptoms.  Plans f/u with [facility] - for question of need for f/u MRI.

## 2020-04-06 NOTE — Telephone Encounter (Signed)
Angel Riddle sees Dr Earle Gell [facility].  Last evaluated 12/2019.  Per his note, if pt had increased symptoms, needed earlier appt.  Needs earlier appt. Pt having increased radicular symptoms.  Please schedule pt an earlier appt with Dr Arnoldo Morale.

## 2020-04-06 NOTE — Assessment & Plan Note (Signed)
colonoscopy 09/2015 -  Sigmoid polyp and fissure and no bleeding internal hemorrhoids.  Recommended f/u colonoscopy in 5 years.

## 2020-04-06 NOTE — Assessment & Plan Note (Signed)
Followed by Dr Nicolasa Ducking.  On cymbalta and prozac.  Stable.

## 2020-04-06 NOTE — Assessment & Plan Note (Signed)
On pravastatin.   

## 2020-04-07 NOTE — Telephone Encounter (Signed)
Good morning!  I called [facility] to get pt scheduled with a sooner appt due to pt increased radicular symptoms. Per ashley at that ofc she states she has to send Dr Arnoldo Morale a message before scheduling.

## 2020-04-07 NOTE — Telephone Encounter (Signed)
Is pt aware she needs to call office?

## 2020-04-09 NOTE — Telephone Encounter (Signed)
They will call pt after sending Dr Arnoldo Morale a message.

## 2020-04-15 ENCOUNTER — Other Ambulatory Visit: Payer: Self-pay | Admitting: Student

## 2020-04-15 DIAGNOSIS — M5416 Radiculopathy, lumbar region: Secondary | ICD-10-CM

## 2020-04-28 ENCOUNTER — Ambulatory Visit: Payer: BC Managed Care – PPO

## 2020-05-10 ENCOUNTER — Ambulatory Visit
Admission: RE | Admit: 2020-05-10 | Discharge: 2020-05-10 | Disposition: A | Discharge status: Home / Self Care | Payer: BC Managed Care – PPO | Source: Ambulatory Visit | Attending: Student | Admitting: Student

## 2020-05-10 ENCOUNTER — Other Ambulatory Visit: Payer: Self-pay

## 2020-05-10 DIAGNOSIS — M5416 Radiculopathy, lumbar region: Secondary | ICD-10-CM | POA: Diagnosis present

## 2020-05-10 MED ORDER — GADOBUTROL 1 MMOL/ML IV SOLN
9.0000 mL | Freq: Once | INTRAVENOUS | Status: AC | PRN
  Administered 2020-05-10: 9 mL via INTRAVENOUS

## 2020-05-21 ENCOUNTER — Other Ambulatory Visit: Payer: Self-pay

## 2020-05-21 ENCOUNTER — Encounter: Payer: Self-pay | Admitting: Internal Medicine

## 2020-05-21 ENCOUNTER — Ambulatory Visit (INDEPENDENT_AMBULATORY_CARE_PROVIDER_SITE_OTHER): Payer: BC Managed Care – PPO

## 2020-05-21 ENCOUNTER — Ambulatory Visit (INDEPENDENT_AMBULATORY_CARE_PROVIDER_SITE_OTHER): Payer: BC Managed Care – PPO | Admitting: Internal Medicine

## 2020-05-21 VITALS — BP 140/84 | HR 95 | Temp 97.5°F | Ht 65.0 in | Wt 187.1 lb

## 2020-05-21 DIAGNOSIS — M25552 Pain in left hip: Secondary | ICD-10-CM | POA: Diagnosis not present

## 2020-05-21 DIAGNOSIS — M25562 Pain in left knee: Secondary | ICD-10-CM

## 2020-05-21 DIAGNOSIS — M25551 Pain in right hip: Secondary | ICD-10-CM

## 2020-05-21 DIAGNOSIS — M533 Sacrococcygeal disorders, not elsewhere classified: Secondary | ICD-10-CM | POA: Diagnosis not present

## 2020-05-21 DIAGNOSIS — M25522 Pain in left elbow: Secondary | ICD-10-CM | POA: Diagnosis not present

## 2020-05-21 DIAGNOSIS — G8929 Other chronic pain: Secondary | ICD-10-CM | POA: Diagnosis not present

## 2020-05-21 NOTE — Progress Notes (Signed)
Patient has been having on going back pain. Had a MRI of her Lumbar spine which reveled some Osteoarthritis and narrowing. States she saw Dr Renita Papa in [place] and he stated nothing needed surgery. Pain is now radiating down in to the left hip and knee. Pain is 7/10.   New left elbow pain. States it is a sharp on and off pain.

## 2020-05-21 NOTE — Patient Instructions (Signed)
voltaren gel as needed  Tylenol     Hip Exercises Ask your health care provider which exercises are safe for you. Do exercises exactly as told by your health care provider and adjust them as directed. It is normal to feel mild stretching, pulling, tightness, or discomfort as you do these exercises. Stop right away if you feel sudden pain or your pain gets worse. Do not begin these exercises until told by your health care provider. Stretching and range-of-motion exercises These exercises warm up your muscles and joints and improve the movement and flexibility of your hip. These exercises also help to relieve pain, numbness, and tingling. You may be asked to limit your range of motion if you had a hip replacement. Talk to your health care provider about these restrictions. Hamstrings, supine  1. Lie on your back (supine position). 2. Loop a belt or towel over the ball of your left / right foot. The ball of your foot is on the walking surface, right under your toes. 3. Straighten your left / right knee and slowly pull on the belt or towel to raise your leg until you feel a gentle stretch behind your knee (hamstring). ? Do not let your knee bend while you do this. ? Keep your other leg flat on the floor. 4. Hold this position for __________ seconds. 5. Slowly return your leg to the starting position. Repeat __________ times. Complete this exercise __________ times a day. Hip rotation  1. Lie on your back on a firm surface. 2. With your left / right hand, gently pull your left / right knee toward the shoulder that is on the same side of the body. Stop when your knee is pointing toward the ceiling. 3. Hold your left / right ankle with your other hand. 4. Keeping your knee steady, gently pull your left / right ankle toward your other shoulder until you feel a stretch in your buttocks. ? Keep your hips and shoulders firmly planted while you do this stretch. 5. Hold this position for __________  seconds. Repeat __________ times. Complete this exercise __________ times a day. Seated stretch This exercise is sometimes called hamstrings and adductors stretch. 1. Sit on the floor with your legs stretched wide. Keep your knees straight during this exercise. 2. Keeping your head and back in a straight line, bend at your waist to reach for your left foot (position A). You should feel a stretch in your right inner thigh (adductors). 3. Hold this position for __________ seconds. Then slowly return to the upright position. 4. Keeping your head and back in a straight line, bend at your waist to reach forward (position B). You should feel a stretch behind both of your thighs and knees (hamstrings). 5. Hold this position for __________ seconds. Then slowly return to the upright position. 6. Keeping your head and back in a straight line, bend at your waist to reach for your right foot (position C). You should feel a stretch in your left inner thigh (adductors). 7. Hold this position for __________ seconds. Then slowly return to the upright position. Repeat __________ times. Complete this exercise __________ times a day. Lunge This exercise stretches the muscles of the hip (hip flexors). 1. Place your left / right knee on the floor and bend your other knee so that is directly over your ankle. You should be half-kneeling. 2. Keep good posture with your head over your shoulders. 3. Tighten your buttocks to point your tailbone downward. This will prevent your  back from arching too much. 4. You should feel a gentle stretch in the front of your left / right thigh and hip. If you do not feel a stretch, slide your other foot forward slightly and then slowly lunge forward with your chest up until your knee once again lines up over your ankle. ? Make sure your tailbone continues to point downward. 5. Hold this position for __________ seconds. 6. Slowly return to the starting position. Repeat __________ times.  Complete this exercise __________ times a day. Strengthening exercises These exercises build strength and endurance in your hip. Endurance is the ability to use your muscles for a long time, even after they get tired. Bridge This exercise strengthens the muscles of your hip (hip extensors). 1. Lie on your back on a firm surface with your knees bent and your feet flat on the floor. 2. Tighten your buttocks muscles and lift your bottom off the floor until the trunk of your body and your hips are level with your thighs. ? Do not arch your back. ? You should feel the muscles working in your buttocks and the back of your thighs. If you do not feel these muscles, slide your feet 1-2 inches (2.5-5 cm) farther away from your buttocks. 3. Hold this position for __________ seconds. 4. Slowly lower your hips to the starting position. 5. Let your muscles relax completely between repetitions. Repeat __________ times. Complete this exercise __________ times a day. Straight leg raises, side-lying This exercise strengthens the muscles that move the hip joint away from the center of the body (hip abductors). 1. Lie on your side with your left / right leg in the top position. Lie so your head, shoulder, hip, and knee line up. You may bend your bottom knee slightly to help you balance. 2. Roll your hips slightly forward, so your hips are stacked directly over each other and your left / right knee is facing forward. 3. Leading with your heel, lift your top leg 4-6 inches (10-15 cm). You should feel the muscles in your top hip lifting. ? Do not let your foot drift forward. ? Do not let your knee roll toward the ceiling. 4. Hold this position for __________ seconds. 5. Slowly return to the starting position. 6. Let your muscles relax completely between repetitions. Repeat __________ times. Complete this exercise __________ times a day. Straight leg raises, side-lying This exercise strengthens the muscles that  move the hip joint toward the center of the body (hip adductors). 1. Lie on your side with your left / right leg in the bottom position. Lie so your head, shoulder, hip, and knee line up. You may place your upper foot in front to help you balance. 2. Roll your hips slightly forward, so your hips are stacked directly over each other and your left / right knee is facing forward. 3. Tense the muscles in your inner thigh and lift your bottom leg 4-6 inches (10-15 cm). 4. Hold this position for __________ seconds. 5. Slowly return to the starting position. 6. Let your muscles relax completely between repetitions. Repeat __________ times. Complete this exercise __________ times a day. Straight leg raises, supine This exercise strengthens the muscles in the front of your thigh (quadriceps). 1. Lie on your back (supine position) with your left / right leg extended and your other knee bent. 2. Tense the muscles in the front of your left / right thigh. You should see your kneecap slide up or see increased dimpling just above your  knee. 3. Keep these muscles tight as you raise your leg 4-6 inches (10-15 cm) off the floor. Do not let your knee bend. 4. Hold this position for __________ seconds. 5. Keep these muscles tense as you lower your leg. 6. Relax the muscles slowly and completely between repetitions. Repeat __________ times. Complete this exercise __________ times a day. Hip abductors, standing This exercise strengthens the muscles that move the leg and hip joint away from the center of the body (hip abductors). 1. Tie one end of a rubber exercise band or tubing to a secure surface, such as a chair, table, or pole. 2. Loop the other end of the band or tubing around your left / right ankle. 3. Keeping your ankle with the band or tubing directly opposite the secured end, step away until there is tension in the tubing or band. Hold on to a chair, table, or pole as needed for balance. 4. Lift your left  / right leg out to your side. While you do this: ? Keep your back upright. ? Keep your shoulders over your hips. ? Keep your toes pointing forward. ? Make sure to use your hip muscles to slowly lift your leg. Do not tip your body or forcefully lift your leg. 5. Hold this position for __________ seconds. 6. Slowly return to the starting position. Repeat __________ times. Complete this exercise __________ times a day. Squats This exercise strengthens the muscles in the front of your thigh (quadriceps). 1. Stand in a door frame so your feet and knees are in line with the frame. You may place your hands on the frame for balance. 2. Slowly bend your knees and lower your hips like you are going to sit in a chair. ? Keep your lower legs in a straight-up-and-down position. ? Do not let your hips go lower than your knees. ? Do not bend your knees lower than told by your health care provider. ? If your hip pain increases, do not bend as low. 3. Hold this position for ___________ seconds. 4. Slowly push with your legs to return to standing. Do not use your hands to pull yourself to standing. Repeat __________ times. Complete this exercise __________ times a day. This information is not intended to replace advice given to you by your health care provider. Make sure you discuss any questions you have with your health care provider. Document Revised: 06/28/2019 Document Reviewed: 10/03/2018 [employer] Patient Education  2020 [place].  Back Exercises The following exercises strengthen the muscles that help to support the trunk and back. They also help to keep the lower back flexible. Doing these exercises can help to prevent back pain or lessen existing pain.  If you have back pain or discomfort, try doing these exercises 2-3 times each day or as told by your health care provider.  As your pain improves, do them once each day, but increase the number of times that you repeat the steps for each  exercise (do more repetitions).  To prevent the recurrence of back pain, continue to do these exercises once each day or as told by your health care provider. Do exercises exactly as told by your health care provider and adjust them as directed. It is normal to feel mild stretching, pulling, tightness, or discomfort as you do these exercises, but you should stop right away if you feel sudden pain or your pain gets worse. Exercises Single knee to chest Repeat these steps 3-5 times for each leg: 1. Lie on  your back on a firm bed or the floor with your legs extended. 2. Bring one knee to your chest. Your other leg should stay extended and in contact with the floor. 3. Hold your knee in place by grabbing your knee or thigh with both hands and hold. 4. Pull on your knee until you feel a gentle stretch in your lower back or buttocks. 5. Hold the stretch for 10-30 seconds. 6. Slowly release and straighten your leg. Pelvic tilt Repeat these steps 5-10 times: 1. Lie on your back on a firm bed or the floor with your legs extended. 2. Bend your knees so they are pointing toward the ceiling and your feet are flat on the floor. 3. Tighten your lower abdominal muscles to press your lower back against the floor. This motion will tilt your pelvis so your tailbone points up toward the ceiling instead of pointing to your feet or the floor. 4. With gentle tension and even breathing, hold this position for 5-10 seconds. Cat-cow Repeat these steps until your lower back becomes more flexible: 1. Get into a hands-and-knees position on a firm surface. Keep your hands under your shoulders, and keep your knees under your hips. You may place padding under your knees for comfort. 2. Let your head hang down toward your chest. Contract your abdominal muscles and point your tailbone toward the floor so your lower back becomes rounded like the back of a cat. 3. Hold this position for 5 seconds. 4. Slowly lift your head, let  your abdominal muscles relax and point your tailbone up toward the ceiling so your back forms a sagging arch like the back of a cow. 5. Hold this position for 5 seconds.  Press-ups Repeat these steps 5-10 times: 1. Lie on your abdomen (face-down) on the floor. 2. Place your palms near your head, about shoulder-width apart. 3. Keeping your back as relaxed as possible and keeping your hips on the floor, slowly straighten your arms to raise the top half of your body and lift your shoulders. Do not use your back muscles to raise your upper torso. You may adjust the placement of your hands to make yourself more comfortable. 4. Hold this position for 5 seconds while you keep your back relaxed. 5. Slowly return to lying flat on the floor.  Bridges Repeat these steps 10 times: 1. Lie on your back on a firm surface. 2. Bend your knees so they are pointing toward the ceiling and your feet are flat on the floor. Your arms should be flat at your sides, next to your body. 3. Tighten your buttocks muscles and lift your buttocks off the floor until your waist is at almost the same height as your knees. You should feel the muscles working in your buttocks and the back of your thighs. If you do not feel these muscles, slide your feet 1-2 inches farther away from your buttocks. 4. Hold this position for 3-5 seconds. 5. Slowly lower your hips to the starting position, and allow your buttocks muscles to relax completely. If this exercise is too easy, try doing it with your arms crossed over your chest. Abdominal crunches Repeat these steps 5-10 times: 1. Lie on your back on a firm bed or the floor with your legs extended. 2. Bend your knees so they are pointing toward the ceiling and your feet are flat on the floor. 3. Cross your arms over your chest. 4. Tip your chin slightly toward your chest without bending your neck.  5. Tighten your abdominal muscles and slowly raise your trunk (torso) high enough to lift  your shoulder blades a tiny bit off the floor. Avoid raising your torso higher than that because it can put too much stress on your low back and does not help to strengthen your abdominal muscles. 6. Slowly return to your starting position. Back lifts Repeat these steps 5-10 times: 1. Lie on your abdomen (face-down) with your arms at your sides, and rest your forehead on the floor. 2. Tighten the muscles in your legs and your buttocks. 3. Slowly lift your chest off the floor while you keep your hips pressed to the floor. Keep the back of your head in line with the curve in your back. Your eyes should be looking at the floor. 4. Hold this position for 3-5 seconds. 5. Slowly return to your starting position. Contact a health care provider if:  Your back pain or discomfort gets much worse when you do an exercise.  Your worsening back pain or discomfort does not lessen within 2 hours after you exercise. If you have any of these problems, stop doing these exercises right away. Do not do them again unless your health care provider says that you can. Get help right away if:  You develop sudden, severe back pain. If this happens, stop doing the exercises right away. Do not do them again unless your health care provider says that you can. This information is not intended to replace advice given to you by your health care provider. Make sure you discuss any questions you have with your health care provider. Document Revised: 03/29/2019 Document Reviewed: 08/24/2018 [employer] Patient Education  [place].   Elbow and Forearm Exercises Ask your health care provider which exercises are safe for you. Do exercises exactly as told by your health care provider and adjust them as directed. It is normal to feel mild stretching, pulling, tightness, or discomfort as you do these exercises. Stop right away if you feel sudden pain or your pain gets worse. Do not begin these exercises until told by your  health care provider. Range-of-motion exercises These exercises warm up your muscles and joints and improve the movement and flexibility of your injured elbow and forearm. The exercises also help to relieve pain, numbness, and tingling. These exercises are done using the muscles in your injured elbow and forearm (active). Elbow flexion, active 6. Hold your left / right arm at your side, and bend your elbow (flexion) as far as you can using only your arm muscles. 7. Hold this position for __________ seconds. 8. Slowly return to the starting position. Repeat __________ times. Complete this exercise __________ times a day. Elbow extension, active 6. Hold your left / right arm at your side, and straighten your elbow (extension) as much as you can using only your arm muscles. 7. Hold this position for __________ seconds. 8. Slowly return to the starting position. Repeat __________ times. Complete this exercise __________ times a day. Active forearm rotation, supination This is an exercise in which you turn (rotate) your forearm palm up (supination). 8. Stand or sit with your elbows at your sides. [place] your left / right elbow to a 90-degree angle (right angle). 10. Rotate your palm up until you feel a gentle stretch on the inside of your forearm. 11. Hold this position for __________ seconds. 12. Slowly return to the starting position. Repeat __________ times. Complete this exercise __________ times a day. Active forearm rotation, pronation This is  an exercise in which you turn (rotate) your forearm palm down (pronation). 7. Stand or sit with your elbows at your sides. 8. Bend your left / right elbow to a 90-degree angle (right angle). 9. Rotate your palm down until you feel a gentle stretch on the top of your forearm. 10. Hold this position for __________ seconds. 11. Slowly return to the starting position. Repeat __________ times. Complete this exercise __________ times a day. Stretching  exercises These exercises warm up your muscles and joints and improve the movement and flexibility of your injured elbow and forearm. These exercises also help to relieve pain, numbness, and tingling. These exercises are done using your healthy elbow and forearm to help stretch the muscles in your injured elbow and forearm (active-assisted). Elbow flexion, active-assisted  6. Hold your left / right arm at your side, and bend your elbow (flexion) as much as you can using your left / right arm muscles. 7. Use your other hand to bend your left / right elbow farther. To do this, gently push up on your forearm until you feel a gentle stretch on the back of your elbow. 8. Hold this position for __________ seconds. 9. Slowly return to the starting position. Repeat __________ times. Complete this exercise __________ times a day. Elbow extension, active-assisted  7. Hold your left / right arm at your side, and straighten your elbow (extension) as much as you can using your left / right arm muscles. 8. Use your other hand to straighten the left / right elbow farther. To do this, gently push down on your forearm until you feel a gentle stretch on the inside of your elbow. 9. Hold this position for __________ seconds. 10. Slowly return to the starting position. Repeat __________ times. Complete this exercise __________ times a day. Active-assisted forearm rotation, supination This is an exercise in which you turn (rotate) your forearm palm up (supination). 7. Sit with your left / right elbow bent in a 90-degree angle (right angle) with your forearm resting on a table. 8. Keeping your upper body and shoulder still, rotate your forearm so your palm faces upward. 9. Use your other hand to help rotate your forearm further until you feel a gentle to moderate stretch. 10. Hold this position for __________ seconds. 11. Slowly release the stretch and return to the starting position. Repeat __________ times.  Complete this exercise __________ times a day. Active-assisted forearm rotation, pronation This is an exercise in which you turn (rotate) your forearm palm down (pronation). 7. Sit with your left / right elbow bent in a 90-degree angle (right angle) with your forearm resting on a table. 8. Keeping your upper body and shoulder still, rotate your forearm so your palm faces the tabletop. 9. Use your other hand to help rotate your forearm further until you feel a gentle to moderate stretch. 10. Hold this position for __________ seconds. 11. Slowly release the stretch and return to the starting position. Repeat __________ times. Complete this exercise __________ times a day. Passive elbow flexion, supine 7. Lie on your back (supine position). 8. Extend your left / right arm up in the air, bracing it with your other hand. 9. Let your left / right hand slowly lower toward your shoulder (passive flexion), while your elbow stays pointed toward the ceiling. You should feel a gentle stretch along the back of your upper arm and elbow. 10. If instructed by your health care provider, you may increase the intensity of your stretch by adding  a small wrist weight or hand weight. 11. Hold this position for __________ seconds. 12. Slowly return to the starting position. Repeat __________ times. Complete this exercise __________ times a day. Passive elbow extension, supine  5. Lie on your back (supine position). Make sure that you are in a comfortable position that lets you relax your arm muscles. 6. Place a folded towel under your left / right upper arm so your elbow and shoulder are at the same height. Straighten your left / right arm so your elbow does not rest on the bed or towel. 7. Let the weight of your hand stretch your elbow (passive extension). Keep your arm and chest muscles relaxed. You should feel a stretch on the inside of your elbow. 8. If told by your health care provider, you may increase the  intensity of your stretch by adding a small wrist weight or hand weight. 9. Hold this position for __________ seconds. 10. Slowly release the stretch. Repeat __________ times. Complete this exercise __________ times a day. Strengthening exercises These exercises build strength and endurance in your elbow and forearm. Endurance is the ability to use your muscles for a long time, even after they get tired. Elbow flexion, isometric  1. Stand or sit up straight. 2. Bend your left / right elbow in a 90-degree angle (right angle), and keep your forearm at the height of your waist. Your thumb should be pointed toward the ceiling (neutral forearm). 3. Place your other hand on top of your left / right forearm. Gently push down while you resist with your left / right arm (isometric flexion). Push as hard as you can with both arms without causing any pain or movement at your left / right elbow. 4. Hold this position for __________ seconds. 5. Slowly release the tension in both arms. Let your muscles relax completely before you repeat the exercise. Repeat __________ times. Complete this exercise __________ times a day. Elbow extension, isometric  1. Stand or sit up straight. 2. Place your left / right arm so your palm faces your abdomen and is at the height of your waist. 3. Place your other hand on the underside of your left / right forearm. Gently push up while you resist with your left / right arm (isometric extension). Push as hard as you can with both arms without causing any pain or movement at your left / right elbow. 4. Hold this position for __________ seconds. 5. Slowly release the tension in both arms. Let your muscles relax completely before you repeat the exercise. Repeat __________ times. Complete this exercise __________ times a day. Elbow flexion with forearm palm up  1. Sit on a firm chair without armrests, or stand up. 2. Place your left / right arm at your side with your elbow  straight and your palm facing forward. 3. Holding a __________weight or gripping a rubber exercise band or tubing, bend your elbow to bring your hand toward your shoulder (flexion). 4. Hold this position for __________ seconds. 5. Slowly return to the starting position. Repeat __________ times. Complete this exercise __________ times a day. Elbow extension, active  1. Sit on a firm chair without armrests, or stand up. 2. Hold a rubber exercise band or tubing in both hands. 3. Keeping your upper arms at your sides, bring both hands up to your left / right shoulder. Keep your left / right hand just below your other hand. 4. Straighten your left / right elbow (extension) while keeping your other arm  still. 5. Hold this position for __________ seconds. 6. Control the resistance of the band or tubing as you return to the starting position. Repeat __________ times. Complete this exercise __________ times a day. Forearm rotation, supination  1. Sit with your left / right forearm supported on a table. Your elbow should be at waist height and bent at a 90-degree angle (right angle). 2. Gently grasp a lightweight hammer. 3. Rest your hand over the edge of the table with your palm facing down. 4. Without moving your left / right elbow, slowly rotate your forearm to turn your palm up toward the ceiling (supination). 5. Hold this position for __________ seconds. 6. Slowly return to the starting position. Repeat __________ times. Complete this exercise __________ times a day. Forearm rotation, pronation  1. Sit with your left / right forearm supported on a table. Keep your elbow below shoulder height. 2. Gently grasp a lightweight hammer. 3. Rest your hand over the edge of the table with your palm facing up. 4. Without moving your left / right elbow, slowly rotate your forearm to turn your palm down toward the floor (pronation). 5. Hold this position for __________seconds. 6. Slowly return to the  starting position. Repeat __________ times. Complete this exercise __________ times a day. This information is not intended to replace advice given to you by your health care provider. Make sure you discuss any questions you have with your health care provider. Document Revised: 03/15/2019 Document Reviewed: 12/13/2018 Elsevier Patient Education  [place].

## 2020-05-21 NOTE — Progress Notes (Signed)
Chief Complaint  Patient presents with   Back Pain   Elbow Pain    Left elbow   Acute visit  C/o left lower back/tailbone pain and left hip pain and right lower back pain. At times pain 7/10 radiates to left hip/knee/calf. Tried voltaren gel and dotterra deep blue and taking norcol 7.5-325 mg 1.5 tablets qd to bid to tid prn which helps at times in the am feels like hit with at board and legs feel sore and like they are going to give out.  She had lamiectomy 09/23/19 with Dr. Arnoldo Morale NS and they do not think its coming from her back and she has appt 06/2020   C/o left elbow pain   MRI 05/10/20  TECHNIQUE: Multiplanar and multiecho pulse sequences of the lumbar spine were obtained without and with intravenous contrast.  CONTRAST:  34mL GADAVIST GADOBUTROL 1 MMOL/ML IV SOLN  COMPARISON:  02/22/2017  FINDINGS: Segmentation:  Standard lumbar numbering  Alignment: Grade 1 anterolisthesis at L1-2, facet mediated and chronic.  Vertebrae:  L3-L5 PLIF with solid appearing arthrodesis.  Conus medullaris and cauda equina: Conus extends to the L1-2 level. Conus and cauda equina appear normal.  Paraspinal and other soft tissues: Negative  Disc levels:  T12- L1: Minimal facet spurring.  No herniation or impingement  L1-L2: Progressed degenerative facet spurring with mild anterolisthesis. Bulging of the disc. No neural impingement  L2-L3: Progressed degenerative facet spurring and mild disc bulging. No neural impingement  L3-L4: PLIF and laminectomy.  No impingement  L4-L5: PLIF and laminectomy.  No impingement  L5-S1:Facet osteoarthritis with spurring and posterior left sided synovial cyst, progressed. Mild to moderate left foraminal narrowing.  IMPRESSION: 1. L3-L5 PLIF with probable solid arthrodesis and no residual impingement. 2. 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 3. L5-S1 mild to moderate left  foraminal narrowing.   Review of Systems  Constitutional: Negative for weight loss.  HENT: Negative for hearing loss.   Eyes: Negative for blurred vision.  Respiratory: Negative for shortness of breath.   Cardiovascular: Negative for chest pain.  Gastrointestinal: Negative for abdominal pain.  Musculoskeletal: Positive for back pain and joint pain.  Skin: Negative for rash.   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   Chronic back pain    degenerative disc disease   Degenerative disc disease, lumbar    Depression    Dyspnea    with exertion / pain   Gallstones    GERD (gastroesophageal reflux disease)    Heart murmur    followed by PCP   Hx of colonic polyp    Hx: UTI (urinary tract infection)    Hypercholesterolemia    Hypertension    Panic attacks    H/O   Wears dentures    partial upper and lower   Past Surgical History:  Procedure Laterality Date   ABDOMINAL HYSTERECTOMY  1992   partial   BACK SURGERY     lumbar fusion   CATARACT EXTRACTION Bilateral 2010,2011   CHOLECYSTECTOMY  04-19-14   COLONOSCOPY  07/02/11   DR. Byrnett   COLONOSCOPY WITH PROPOFOL N/A 09/25/2015   Procedure: COLONOSCOPY WITH PROPOFOL;  Surgeon: Lucilla Lame, MD;  Location: [facility];  Service: Endoscopy;  Laterality: N/A;   FOOT SURGERY Right 2009   MOUTH SURGERY     POLYPECTOMY  09/25/2015  Procedure: POLYPECTOMY;  Surgeon: Lucilla Lame, MD;  Location: [facility];  Service: Endoscopy;;   skin surgery Left 08/27/13   Basal cell removal-left nostril   TONSILLECTOMY AND ADENOIDECTOMY  1969   Family History  Problem Relation Age of Onset   Hyperlipidemia Mother    Hypertension Mother    Alcohol abuse Father    Hyperlipidemia Father    Heart disease Father    Diabetes Father    Hyperlipidemia Sister    Lung cancer Brother    Hyperlipidemia Brother    Breast  cancer Neg Hx    Social History   Socioeconomic History   Marital status: Married    Spouse name: Not on file   Number of children: 2   Years of education: Not on file   Highest education level: Not on file  Occupational History   Not on file  Tobacco Use   Smoking status: Current Every Day Smoker    Packs/day: 1.00    Years: 37.00    Pack years: 37.00    Types: Cigarettes    Start date: 08/07/2019   Smokeless tobacco: Never Used  Vaping Use   Vaping Use: Former  Substance and Sexual Activity   Alcohol use: Yes    Alcohol/week: 1.0 standard drink    Types: 1 Glasses of wine per week   Drug use: No   Sexual activity: Not on file  Other Topics Concern   Not on file  Social History Narrative   Not on file   Social Determinants of Health   Financial Resource Strain:    Difficulty of Paying Living Expenses:   Food Insecurity:    Worried About Charity fundraiser in the Last Year:    Arboriculturist in the Last Year:   Transportation Needs:    Film/video editor (Medical):    Lack of Transportation (Non-Medical):   Physical Activity:    Days of Exercise per Week:    Minutes of Exercise per Session:   Stress:    Feeling of Stress :   Social Connections:    Frequency of Communication with Friends and Family:    Frequency of Social Gatherings with Friends and Family:    Attends Religious Services:    Active Member of Clubs or Organizations:    Attends Music therapist:    Marital Status:   Intimate Partner Violence:    Fear of Current or Ex-Partner:    Emotionally Abused:    Physically Abused:    Sexually Abused:    Current Meds  Medication Sig   ezetimibe (ZETIA) 10 MG tablet Take 1 tablet (10 mg total) by mouth daily.   FLUoxetine (PROZAC) 40 MG capsule Take 1 capsule (40 mg total) by mouth daily.   fluticasone (FLONASE) 50 MCG/ACT nasal spray Place 1 spray into both nostrils daily as needed for allergies.  Reported on 11/25/2015   hydrochlorothiazide (MICROZIDE) 12.5 MG capsule Take 1 capsule (12.5 mg total) by mouth daily.   HYDROcodone-acetaminophen (NORCO) 7.5-325 MG tablet    meloxicam (MOBIC) 15 MG tablet Take 1 tablet (15 mg total) by mouth daily.   omeprazole (PRILOSEC) 40 MG capsule Take 40 mg by mouth daily.    potassium chloride (KLOR-CON) 10 MEQ tablet Take 1 tablet (10 mEq total) by mouth daily.   pravastatin (PRAVACHOL) 10 MG tablet Take 1 tablet (10 mg total) by mouth daily.   tiZANidine (ZANAFLEX) 4 MG tablet Take 2-4 mg by mouth 3 times  daily as needed for muscle spasms   Allergies  Allergen Reactions   Levaquin [Levofloxacin In D5w] Other (See Comments)    Muscle pain   Adhesive [Tape] Other (See Comments)    Pulls skin, anything that does not pull is recommended    Doxycycline Nausea And Vomiting   Ivp Dye [Iodinated Diagnostic Agents] Nausea And Vomiting and Other (See Comments)     Eyes turned blood red   Recent Results (from the past 2160 hour(s))  Basic metabolic panel     Status: None   Collection Time: 03/31/20 10:06 AM  Result Value Ref Range   Sodium 139 135 - 145 mEq/L   Potassium 3.6 3.5 - 5.1 mEq/L   Chloride 102 96 - 112 mEq/L   CO2 29 19 - 32 mEq/L   Glucose, Bld 97 70 - 99 mg/dL   BUN 16 6 - 23 mg/dL   Creatinine, Ser 0.84 0.40 - 1.20 mg/dL   GFR 68.23 >60.00 mL/min   Calcium 9.2 8.4 - 10.5 mg/dL  TSH     Status: None   Collection Time: 03/31/20 10:06 AM  Result Value Ref Range   TSH 1.18 0.35 - 4.50 uIU/mL  Lipid panel     Status: Abnormal   Collection Time: 03/31/20 10:06 AM  Result Value Ref Range   Cholesterol 254 (H) 0 - 200 mg/dL    Comment: ATP III Classification       Desirable:  < 200 mg/dL               Borderline High:  200 - 239 mg/dL          High:  > = 240 mg/dL   Triglycerides 245.0 (H) 0 - 149 mg/dL    Comment: Normal:  <150 mg/dLBorderline High:  150 - 199 mg/dL   HDL 46.00 >39.00 mg/dL   VLDL 49.0 (H) 0.0 - 40.0  mg/dL   Total CHOL/HDL Ratio 6     Comment:                Men          Women1/2 Average Risk     3.4          3.3Average Risk          5.0          4.42X Average Risk          9.6          7.13X Average Risk          15.0          11.0                       NonHDL 207.62     Comment: NOTE:  Non-HDL goal should be 30 mg/dL higher than patient's LDL goal (i.e. LDL goal of < 70 mg/dL, would have non-HDL goal of < 100 mg/dL)  Hepatic function panel     Status: None   Collection Time: 03/31/20 10:06 AM  Result Value Ref Range   Total Bilirubin 0.5 0.2 - 1.2 mg/dL   Bilirubin, Direct 0.1 0.0 - 0.3 mg/dL   Alkaline Phosphatase 98 39 - 117 U/L   AST 23 0 - 37 U/L   ALT 12 0 - 35 U/L   Total Protein 7.1 6.0 - 8.3 g/dL   Albumin 4.2 3.5 - 5.2 g/dL  Hemoglobin A1c     Status: None   Collection Time: 03/31/20  10:06 AM  Result Value Ref Range   Hgb A1c MFr Bld 6.0 4.6 - 6.5 %    Comment: Glycemic Control Guidelines for People with Diabetes:Non Diabetic:  <6%Goal of Therapy: <7%Additional Action Suggested:  >8%   CBC with Differential/Platelet     Status: None   Collection Time: 03/31/20 10:06 AM  Result Value Ref Range   WBC 9.1 4.0 - 10.5 K/uL   RBC 4.76 3.87 - 5.11 Mil/uL   Hemoglobin 14.0 12.0 - 15.0 g/dL   HCT 41.9 36 - 46 %   MCV 87.9 78.0 - 100.0 fl   MCHC 33.4 30.0 - 36.0 g/dL   RDW 13.7 11.5 - 15.5 %   Platelets 360.0 150 - 400 K/uL   Neutrophils Relative % 60.1 43 - 77 %   Lymphocytes Relative 30.5 12 - 46 %   Monocytes Relative 6.9 3 - 12 %   Eosinophils Relative 1.8 0 - 5 %   Basophils Relative 0.7 0 - 3 %   Neutro Abs 5.5 1.4 - 7.7 K/uL   Lymphs Abs 2.8 0.7 - 4.0 K/uL   Monocytes Absolute 0.6 0 - 1 K/uL   Eosinophils Absolute 0.2 0 - 0 K/uL   Basophils Absolute 0.1 0 - 0 K/uL  LDL cholesterol, direct     Status: None   Collection Time: 03/31/20 10:06 AM  Result Value Ref Range   Direct LDL 170.0 mg/dL    Comment: Optimal:  <100 mg/dLNear or Above Optimal:  100-129  mg/dLBorderline High:  130-159 mg/dLHigh:  160-189 mg/dLVery High:  >190 mg/dL   Objective  Body mass index is 31.14 kg/m. Wt Readings from Last 3 Encounters:  05/21/20 187 lb 1.9 oz (84.9 kg)  04/01/20 189 lb 9.6 oz (86 kg)  02/01/20 194 lb (88 kg)   Temp Readings from Last 3 Encounters:  05/21/20 (!) 97.5 F (36.4 C) (Temporal)  04/01/20 97.7 F (36.5 C) (Skin)  12/25/19 (!) 95.4 F (35.2 C)   BP Readings from Last 3 Encounters:  05/21/20 140/84  04/01/20 140/88  12/25/19 128/78   Pulse Readings from Last 3 Encounters:  05/21/20 95  04/01/20 87  12/25/19 100    Physical Exam Vitals and nursing note reviewed.  Constitutional:      Appearance: Normal appearance. She is well-developed and well-groomed.  HENT:     Head: Normocephalic and atraumatic.  Eyes:     Conjunctiva/sclera: Conjunctivae normal.     Pupils: Pupils are equal, round, and reactive to light.  Cardiovascular:     Rate and Rhythm: Normal rate and regular rhythm.     Heart sounds: Normal heart sounds. No murmur heard.   Pulmonary:     Effort: Pulmonary effort is normal.     Breath sounds: Normal breath sounds.  Musculoskeletal:     Lumbar back: Tenderness present.       Back:     Left hip: Tenderness present.     Left knee: No tenderness.  Skin:    General: Skin is warm and dry.  Neurological:     General: No focal deficit present.     Mental Status: She is alert and oriented to person, place, and time. Mental status is at baseline.     Gait: Gait normal.  Psychiatric:        Attention and Perception: Attention and perception normal.        Mood and Affect: Mood and affect normal.        Speech: Speech normal.  Behavior: Behavior normal. Behavior is cooperative.        Thought Content: Thought content normal.        Cognition and Memory: Cognition and memory normal.        Judgment: Judgment normal.     Assessment  Plan  Bilateral hip pain - Plan: DG Hip Unilat W OR W/O Pelvis  2-3 Views Right, DG Hip Unilat W OR W/O Pelvis 2-3 Views Left  Ambulatory referral to Orthopedic Surgery Dr. Alvan Dame in [place]    Chronic pain of left knee - Plan: DG Knee Complete 4 Views Left, Ambulatory referral to Orthopedic Surgery  Chronic left sacroiliac pain - Plan: Ambulatory referral to Orthopedic Surgery  Left elbow pain - Plan: Ambulatory referral to Orthopedic Surgery  Provider: Dr. Olivia Mackie McLean-Scocuzza-[facility]

## 2020-06-10 ENCOUNTER — Telehealth: Payer: Self-pay | Admitting: Internal Medicine

## 2020-06-10 NOTE — Telephone Encounter (Signed)
Pt would like to pickup a copy of her xrays from 05/21/20

## 2020-06-10 NOTE — Telephone Encounter (Signed)
CD placed up front for pickup. Pt notified

## 2020-07-02 ENCOUNTER — Other Ambulatory Visit: Payer: Self-pay

## 2020-07-02 ENCOUNTER — Other Ambulatory Visit (INDEPENDENT_AMBULATORY_CARE_PROVIDER_SITE_OTHER)

## 2020-07-02 DIAGNOSIS — I10 Essential (primary) hypertension: Secondary | ICD-10-CM | POA: Diagnosis not present

## 2020-07-02 DIAGNOSIS — R739 Hyperglycemia, unspecified: Secondary | ICD-10-CM

## 2020-07-02 DIAGNOSIS — E7800 Pure hypercholesterolemia, unspecified: Secondary | ICD-10-CM

## 2020-07-02 LAB — LIPID PANEL
Cholesterol: 224 mg/dL — ABNORMAL HIGH (ref 0–200)
HDL: 48.7 mg/dL (ref >39.00)
LDL Cholesterol: 146 mg/dL — ABNORMAL HIGH (ref 0–99)
NonHDL: 175.34
Total CHOL/HDL Ratio: 5
Triglycerides: 148 mg/dL (ref 0.0–149.0)
VLDL: 29.6 mg/dL (ref 0.0–40.0)

## 2020-07-02 LAB — HEPATIC FUNCTION PANEL
ALT: 9 U/L (ref 0–35)
AST: 16 U/L (ref 0–37)
Albumin: 4.1 g/dL (ref 3.5–5.2)
Alkaline Phosphatase: 102 U/L (ref 39–117)
Bilirubin, Direct: 0.1 mg/dL (ref 0.0–0.3)
Total Bilirubin: 0.7 mg/dL (ref 0.2–1.2)
Total Protein: 6.7 g/dL (ref 6.0–8.3)

## 2020-07-02 LAB — BASIC METABOLIC PANEL
BUN: 13 mg/dL (ref 6–23)
CO2: 27 mEq/L (ref 19–32)
Calcium: 9.5 mg/dL (ref 8.4–10.5)
Chloride: 104 mEq/L (ref 96–112)
Creatinine, Ser: 0.82 mg/dL (ref 0.40–1.20)
GFR: 70.1 mL/min (ref >60.00)
Glucose, Bld: 115 mg/dL — ABNORMAL HIGH (ref 70–99)
Potassium: 4.2 mEq/L (ref 3.5–5.1)
Sodium: 137 mEq/L (ref 135–145)

## 2020-07-02 LAB — HEMOGLOBIN A1C: Hgb A1c MFr Bld: 6.1 % (ref 4.6–6.5)

## 2020-07-04 ENCOUNTER — Ambulatory Visit (INDEPENDENT_AMBULATORY_CARE_PROVIDER_SITE_OTHER): Admitting: Internal Medicine

## 2020-07-04 ENCOUNTER — Encounter: Payer: Self-pay | Admitting: Internal Medicine

## 2020-07-04 ENCOUNTER — Other Ambulatory Visit: Payer: Self-pay

## 2020-07-04 DIAGNOSIS — K219 Gastro-esophageal reflux disease without esophagitis: Secondary | ICD-10-CM

## 2020-07-04 DIAGNOSIS — R739 Hyperglycemia, unspecified: Secondary | ICD-10-CM

## 2020-07-04 DIAGNOSIS — I10 Essential (primary) hypertension: Secondary | ICD-10-CM

## 2020-07-04 DIAGNOSIS — F329 Major depressive disorder, single episode, unspecified: Secondary | ICD-10-CM

## 2020-07-04 DIAGNOSIS — I700 Atherosclerosis of aorta: Secondary | ICD-10-CM

## 2020-07-04 DIAGNOSIS — H9319 Tinnitus, unspecified ear: Secondary | ICD-10-CM

## 2020-07-04 DIAGNOSIS — Z86010 Personal history of colonic polyps: Secondary | ICD-10-CM

## 2020-07-04 DIAGNOSIS — F419 Anxiety disorder, unspecified: Secondary | ICD-10-CM

## 2020-07-04 DIAGNOSIS — F32A Depression, unspecified: Secondary | ICD-10-CM

## 2020-07-04 DIAGNOSIS — E7800 Pure hypercholesterolemia, unspecified: Secondary | ICD-10-CM | POA: Diagnosis not present

## 2020-07-04 MED ORDER — BUSPIRONE HCL 5 MG PO TABS: 5.0000 mg | ORAL_TABLET | Freq: Two times a day (BID) | ORAL | 1 refills | Status: DC | PRN

## 2020-07-04 NOTE — Progress Notes (Addendum)
Patient ID: Angel Riddle, female   DOB: 1956-01-26, 64 y.o.   MRN: [id number]   Subjective:    Patient ID: Angel Riddle, female    DOB: [birth year], 64 y.o.   MRN: [id number]  HPI This visit occurred during the SARS-CoV-2 public health emergency.  Safety protocols were in place, including screening questions prior to the visit, additional usage of staff PPE, and extensive cleaning of exam room while observing appropriate contact time as indicated for disinfecting solutions.  Patient here for a scheduled follow up.  Increased stress.  Discussed with her today.  Having issues with her insurance.  Increased stress with her husband's health issues.  Does not feel needs f/u with psychiatry.  On prozac.  Not taking xanax.  Request refill.  Discussed alternatives.  Would like to avoid xanax.  Seeing ortho - hip/leg pain.  S/p injection.  She is going to water aerobics.  Doing weight watchers.  Has lost weight.  No chest pain or sob reported.  No abdominal pain.  No increased cough or congestion.  No bowel problems reported.  Did report tinnitus and discussed hearing change.  Discussed labs.    Past Medical History:  Diagnosis Date  . Allergy   . Anxiety   . Arthritis   . Cancer (HCC)    basal cell nose  . Chronic back pain    degenerative disc disease  . Degenerative disc disease, lumbar   . Depression   . Dyspnea    with exertion / pain  . Gallstones   . GERD (gastroesophageal reflux disease)   . Heart murmur    followed by PCP  . Hx of colonic polyp   . Hx: UTI (urinary tract infection)   . Hypercholesterolemia   . Hypertension   . Panic attacks    H/O  . Wears dentures    partial upper and lower   Past Surgical History:  Procedure Laterality Date  . ABDOMINAL HYSTERECTOMY  1992   partial  . BACK SURGERY     lumbar fusion  . CATARACT EXTRACTION Bilateral 2010,2011  . CHOLECYSTECTOMY  04-19-14  . COLONOSCOPY  07/02/11   DR. Byrnett  . COLONOSCOPY WITH PROPOFOL N/A  09/25/2015   Procedure: COLONOSCOPY WITH PROPOFOL;  Surgeon: Lucilla Lame, MD;  Location: [place];  Service: Endoscopy;  Laterality: N/A;  . FOOT SURGERY Right 2009  . MOUTH SURGERY    . POLYPECTOMY  09/25/2015   Procedure: POLYPECTOMY;  Surgeon: Lucilla Lame, MD;  Location: [place];  Service: Endoscopy;;  . skin surgery Left 08/27/13   Basal cell removal-left nostril  . TONSILLECTOMY AND ADENOIDECTOMY  1969   Family History  Problem Relation Age of Onset  . Hyperlipidemia Mother   . Hypertension Mother   . Alcohol abuse Father   . Hyperlipidemia Father   . Heart disease Father   . Diabetes Father   . Hyperlipidemia Sister   . Lung cancer Brother   . Hyperlipidemia Brother   . Breast cancer Neg Hx    Social History   Socioeconomic History  . Marital status: Married    Spouse name: Not on file  . Number of children: 2  . Years of education: Not on file  . Highest education level: Not on file  Occupational History  . Not on file  Tobacco Use  . Smoking status: Current Every Day Smoker    Packs/day: 1.00    Years: 37.00    Pack years: 37.00  Types: Cigarettes    Start date: 08/07/2019  . Smokeless tobacco: Never Used  Vaping Use  . Vaping Use: Former  Substance and Sexual Activity  . Alcohol use: Yes    Alcohol/week: 1.0 standard drink    Types: 1 Glasses of wine per week  . Drug use: No  . Sexual activity: Not on file  Other Topics Concern  . Not on file  Social History Narrative  . Not on file   Social Determinants of Health   Financial Resource Strain:   . Difficulty of Paying Living Expenses:   Food Insecurity:   . Worried About Charity fundraiser in the Last Year:   . Arboriculturist in the Last Year:   Transportation Needs:   . Film/video editor (Medical):   Marland Kitchen Lack of Transportation (Non-Medical):   Physical Activity:   . Days of Exercise per Week:   . Minutes of Exercise per Session:   Stress:   . Feeling of Stress :     Social Connections:   . Frequency of Communication with Friends and Family:   . Frequency of Social Gatherings with Friends and Family:   . Attends Religious Services:   . Active Member of Clubs or Organizations:   . Attends Archivist Meetings:   Marland Kitchen Marital Status:     Outpatient Encounter Medications as of 07/04/2020  Medication Sig  . ezetimibe (ZETIA) 10 MG tablet Take 1 tablet (10 mg total) by mouth daily.  Marland Kitchen FLUoxetine (PROZAC) 40 MG capsule Take 1 capsule (40 mg total) by mouth daily.  . fluticasone (FLONASE) 50 MCG/ACT nasal spray Place 1 spray into both nostrils daily as needed for allergies. Reported on 11/25/2015  . hydrochlorothiazide (MICROZIDE) 12.5 MG capsule Take 1 capsule (12.5 mg total) by mouth daily.  Marland Kitchen HYDROcodone-acetaminophen (NORCO) 7.5-325 MG tablet   . meloxicam (MOBIC) 15 MG tablet Take 1 tablet (15 mg total) by mouth daily.  Marland Kitchen omeprazole (PRILOSEC) 40 MG capsule Take 40 mg by mouth daily.   . potassium chloride (KLOR-CON) 10 MEQ tablet Take 1 tablet (10 mEq total) by mouth daily.  . pravastatin (PRAVACHOL) 10 MG tablet Take 1 tablet (10 mg total) by mouth daily.  Marland Kitchen tiZANidine (ZANAFLEX) 4 MG tablet Take 2-4 mg by mouth 3 times daily as needed for muscle spasms  . busPIRone (BUSPAR) 5 MG tablet Take 1 tablet (5 mg total) by mouth 2 (two) times daily as needed.  . hydrocortisone (ANUSOL-HC) 25 MG suppository Place 1 suppository (25 mg total) rectally 2 (two) times daily. (Patient not taking: Reported on 05/21/2020)  . [DISCONTINUED] loratadine (CLARITIN) 10 MG tablet Take 10 mg by mouth daily as needed for allergies. (Patient not taking: Reported on 05/21/2020)   No facility-administered encounter medications on file as of 07/04/2020.    Review of Systems  Constitutional: Negative for appetite change and unexpected weight change.  HENT: Negative for congestion and sinus pressure.   Respiratory: Negative for cough, chest tightness and shortness of  breath.   Cardiovascular: Negative for chest pain, palpitations and leg swelling.  Gastrointestinal: Negative for abdominal pain, diarrhea, nausea and vomiting.  Genitourinary: Negative for difficulty urinating and dysuria.  Musculoskeletal: Negative for joint swelling and myalgias.  Skin: Negative for color change and rash.  Neurological: Negative for dizziness, light-headedness and headaches.  Psychiatric/Behavioral: Negative for agitation and dysphoric mood.       Increased stress.         Objective:  Physical Exam Constitutional:      General: She is not in acute distress.    Appearance: Normal appearance.  HENT:     Head: Normocephalic and atraumatic.     Right Ear: External ear normal.     Nose: Nose normal.  Neck:     Thyroid: No thyromegaly.  Cardiovascular:     Rate and Rhythm: Normal rate and regular rhythm.  Pulmonary:     Effort: No respiratory distress.     Breath sounds: Normal breath sounds. No wheezing.  Abdominal:     General: Bowel sounds are normal.     Palpations: Abdomen is soft.     Tenderness: There is no abdominal tenderness.  Musculoskeletal:        General: No swelling or tenderness.     Cervical back: Neck supple.  Lymphadenopathy:     Cervical: No cervical adenopathy.  Skin:    Findings: No erythema or rash.  Neurological:     Mental Status: She is alert.  Psychiatric:        Mood and Affect: Mood normal.        Behavior: Behavior normal.     BP (!) 128/86   Pulse 90   Temp 98.8 F (37.1 C) (Oral)   Resp 15   Ht '5\' 5"'  (1.651 m)   Wt 182 lb 3.2 oz (82.6 kg)   SpO2 97%   BMI 30.32 kg/m  Wt Readings from Last 3 Encounters:  07/04/20 182 lb 3.2 oz (82.6 kg)  05/21/20 187 lb 1.9 oz (84.9 kg)  04/01/20 189 lb 9.6 oz (86 kg)     Lab Results  Component Value Date   WBC 9.1 03/31/2020   HGB 14.0 03/31/2020   HCT 41.9 03/31/2020   PLT 360.0 03/31/2020   GLUCOSE 115 (H) 07/02/2020   CHOL 224 (H) 07/02/2020   TRIG 148.0  07/02/2020   HDL 48.70 07/02/2020   LDLDIRECT 170.0 03/31/2020   LDLCALC 146 (H) 07/02/2020   ALT 9 07/02/2020   AST 16 07/02/2020   NA 137 07/02/2020   K 4.2 07/02/2020   CL 104 07/02/2020   CREATININE 0.82 07/02/2020   BUN 13 07/02/2020   CO2 27 07/02/2020   TSH 1.18 03/31/2020   HGBA1C 6.1 07/02/2020    MR Lumbar Spine W Wo Contrast  Result Date: 05/12/2020 CLINICAL DATA:  Chronic left-sided back pain extending into the left lower extremity with weakness EXAM: MRI LUMBAR SPINE WITHOUT AND WITH CONTRAST TECHNIQUE: Multiplanar and multiecho pulse sequences of the lumbar spine were obtained without and with intravenous contrast. CONTRAST:  76m GADAVIST GADOBUTROL 1 MMOL/ML IV SOLN COMPARISON:  02/22/2017 FINDINGS: Segmentation:  Standard lumbar numbering Alignment: Grade 1 anterolisthesis at L1-2, facet mediated and chronic. Vertebrae:  L3-L5 PLIF with solid appearing arthrodesis. Conus medullaris and cauda equina: Conus extends to the L1-2 level. Conus and cauda equina appear normal. Paraspinal and other soft tissues: Negative Disc levels: T12- L1: Minimal facet spurring.  No herniation or impingement L1-L2: Progressed degenerative facet spurring with mild anterolisthesis. Bulging of the disc. No neural impingement L2-L3: Progressed degenerative facet spurring and mild disc bulging. No neural impingement L3-L4: PLIF and laminectomy.  No impingement L4-L5: PLIF and laminectomy.  No impingement L5-S1:Facet osteoarthritis with spurring and posterior left sided synovial cyst, progressed. Mild to moderate left foraminal narrowing. IMPRESSION: 1. L3-L5 PLIF with probable solid arthrodesis and no residual impingement. 2. 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 3. L5-S1 mild  to moderate left foraminal narrowing. Electronically Signed   By: Monte Fantasia M.D.   On: 05/12/2020 04:52       Assessment & Plan:   Problem List Items Addressed This  Visit    Anxiety    Add buspar as directed.        Relevant Medications   busPIRone (BUSPAR) 5 MG tablet   Aortic atherosclerosis (HCC)    On pravastatin.       Depression    Has been followed by Dr Nicolasa Ducking.  On prozac.  Feels needs something more to help with stress/anxiety.  Requested xanax.  Hopefully can avoid xanax.  Discussed buspar.  Trial of buspar.  Follow.        Relevant Medications   busPIRone (BUSPAR) 5 MG tablet   GERD (gastroesophageal reflux disease)    Controlled.  On prilosec.       History of colonic polyps    Colonoscopy 09/2015 - sigmoid polyp and fissure.  Non bleeding internal hemorrhoids.  Recommended f/u in 5 years.        Hypercholesterolemia    On pravastatin.  Low cholesterol diet and exercise.  Follow lipid panel and liver function tests. Tolerating the low dose.  Desires not to increase dose.        Relevant Orders   Hepatic function panel   Lipid panel   Hyperglycemia    Low carb diet and exercise.  Follow met b and a1c.       Relevant Orders   Hemoglobin A1c   Hypertension    Blood pressure as outlined.  Continue hctz.  Follow pressures.  Follow metabolic panel.       Relevant Orders   Basic metabolic panel   Tinnitus    Persistent problem.  Question of change in hearing. Discussed ENT referral.  Will notify me when ready.           Einar Pheasant, MD

## 2020-07-05 ENCOUNTER — Encounter: Payer: Self-pay | Admitting: Internal Medicine

## 2020-07-05 DIAGNOSIS — H9319 Tinnitus, unspecified ear: Secondary | ICD-10-CM | POA: Insufficient documentation

## 2020-07-05 NOTE — Assessment & Plan Note (Signed)
Blood pressure as outlined.  Continue hctz.  Follow pressures.  Follow metabolic panel.  

## 2020-07-05 NOTE — Assessment & Plan Note (Addendum)
On pravastatin.  Low cholesterol diet and exercise.  Follow lipid panel and liver function tests. Tolerating the low dose.  Desires not to increase dose.

## 2020-07-05 NOTE — Assessment & Plan Note (Signed)
Has been followed by Dr Nicolasa Ducking.  On prozac.  Feels needs something more to help with stress/anxiety.  Requested xanax.  Hopefully can avoid xanax.  Discussed buspar.  Trial of buspar.  Follow.

## 2020-07-05 NOTE — Assessment & Plan Note (Signed)
Add buspar as directed.

## 2020-07-05 NOTE — Assessment & Plan Note (Signed)
Controlled. On prilosec.  

## 2020-07-05 NOTE — Assessment & Plan Note (Signed)
On pravastatin.   

## 2020-07-05 NOTE — Assessment & Plan Note (Signed)
Low carb diet and exercise.  Follow met b and a1c.  

## 2020-07-05 NOTE — Assessment & Plan Note (Signed)
Colonoscopy 09/2015 - sigmoid polyp and fissure.  Non bleeding internal hemorrhoids.  Recommended f/u in 5 years.

## 2020-07-05 NOTE — Assessment & Plan Note (Signed)
Persistent problem.  Question of change in hearing. Discussed ENT referral.  Will notify me when ready.

## 2020-07-17 ENCOUNTER — Encounter: Payer: Self-pay | Admitting: Internal Medicine

## 2020-07-19 ENCOUNTER — Encounter: Payer: Self-pay | Admitting: Internal Medicine

## 2020-07-19 MED ORDER — BUSPIRONE HCL 10 MG PO TABS: 10.0000 mg | ORAL_TABLET | Freq: Two times a day (BID) | ORAL | 1 refills | Status: DC | PRN

## 2020-07-19 NOTE — Telephone Encounter (Signed)
rx sent in for buspar 10mg  bid #60 with one refill.

## 2020-08-25 ENCOUNTER — Other Ambulatory Visit: Payer: Self-pay

## 2020-08-25 ENCOUNTER — Encounter: Payer: Self-pay | Admitting: Internal Medicine

## 2020-08-25 NOTE — Telephone Encounter (Signed)
Patient called office and is waiting for respone.

## 2020-08-27 NOTE — Telephone Encounter (Signed)
Reviewed note.  It appears she was treated with augmentin and finished yesterday.  Is she better.  Still having symptoms?  Any sob?  If any acute symptoms will need to be seen.  I do not mind ordering cxr, but need a little more information to see if needs evaluation.

## 2020-08-28 ENCOUNTER — Other Ambulatory Visit: Payer: Self-pay | Admitting: Internal Medicine

## 2020-08-28 NOTE — Telephone Encounter (Signed)
Patient finished abx. Confirmed feeling better. Would like to hold off on cxr for now. No SOB. Will let me know if she changes her mind

## 2020-09-09 ENCOUNTER — Encounter: Payer: Self-pay | Admitting: Internal Medicine

## 2020-10-14 ENCOUNTER — Other Ambulatory Visit: Payer: Self-pay | Admitting: Internal Medicine

## 2020-10-31 ENCOUNTER — Other Ambulatory Visit: Payer: Self-pay | Admitting: Internal Medicine

## 2020-11-05 ENCOUNTER — Other Ambulatory Visit: Payer: Self-pay

## 2020-11-05 ENCOUNTER — Other Ambulatory Visit (INDEPENDENT_AMBULATORY_CARE_PROVIDER_SITE_OTHER): Payer: 59

## 2020-11-05 DIAGNOSIS — I10 Essential (primary) hypertension: Secondary | ICD-10-CM | POA: Diagnosis not present

## 2020-11-05 DIAGNOSIS — E7800 Pure hypercholesterolemia, unspecified: Secondary | ICD-10-CM

## 2020-11-05 DIAGNOSIS — R739 Hyperglycemia, unspecified: Secondary | ICD-10-CM | POA: Diagnosis not present

## 2020-11-05 LAB — BASIC METABOLIC PANEL
BUN: 20 mg/dL (ref 6–23)
CO2: 28 mEq/L (ref 19–32)
Calcium: 9.2 mg/dL (ref 8.4–10.5)
Chloride: 102 mEq/L (ref 96–112)
Creatinine, Ser: 0.88 mg/dL (ref 0.40–1.20)
GFR: 69.29 mL/min (ref >60.00)
Glucose, Bld: 82 mg/dL (ref 70–99)
Potassium: 3.7 mEq/L (ref 3.5–5.1)
Sodium: 138 mEq/L (ref 135–145)

## 2020-11-05 LAB — LIPID PANEL
Cholesterol: 218 mg/dL — ABNORMAL HIGH (ref 0–200)
HDL: 50.6 mg/dL (ref >39.00)
LDL Cholesterol: 134 mg/dL — ABNORMAL HIGH (ref 0–99)
NonHDL: 167.11
Total CHOL/HDL Ratio: 4
Triglycerides: 166 mg/dL — ABNORMAL HIGH (ref 0.0–149.0)
VLDL: 33.2 mg/dL (ref 0.0–40.0)

## 2020-11-05 LAB — HEPATIC FUNCTION PANEL
ALT: 6 U/L (ref 0–35)
AST: 16 U/L (ref 0–37)
Albumin: 4.1 g/dL (ref 3.5–5.2)
Alkaline Phosphatase: 84 U/L (ref 39–117)
Bilirubin, Direct: 0.1 mg/dL (ref 0.0–0.3)
Total Bilirubin: 0.5 mg/dL (ref 0.2–1.2)
Total Protein: 6.8 g/dL (ref 6.0–8.3)

## 2020-11-05 LAB — HEMOGLOBIN A1C: Hgb A1c MFr Bld: 6 % (ref 4.6–6.5)

## 2020-11-07 ENCOUNTER — Other Ambulatory Visit: Payer: Self-pay

## 2020-11-07 ENCOUNTER — Encounter: Payer: Self-pay | Admitting: Internal Medicine

## 2020-11-07 ENCOUNTER — Ambulatory Visit: Admitting: Internal Medicine

## 2020-11-07 DIAGNOSIS — I700 Atherosclerosis of aorta: Secondary | ICD-10-CM

## 2020-11-07 DIAGNOSIS — F419 Anxiety disorder, unspecified: Secondary | ICD-10-CM

## 2020-11-07 DIAGNOSIS — Z860100 Personal history of colon polyps, unspecified: Secondary | ICD-10-CM

## 2020-11-07 DIAGNOSIS — R739 Hyperglycemia, unspecified: Secondary | ICD-10-CM

## 2020-11-07 DIAGNOSIS — I10 Essential (primary) hypertension: Secondary | ICD-10-CM | POA: Diagnosis not present

## 2020-11-07 DIAGNOSIS — Z87891 Personal history of nicotine dependence: Secondary | ICD-10-CM | POA: Diagnosis not present

## 2020-11-07 DIAGNOSIS — F32A Depression, unspecified: Secondary | ICD-10-CM

## 2020-11-07 DIAGNOSIS — K219 Gastro-esophageal reflux disease without esophagitis: Secondary | ICD-10-CM

## 2020-11-07 DIAGNOSIS — E7800 Pure hypercholesterolemia, unspecified: Secondary | ICD-10-CM

## 2020-11-07 DIAGNOSIS — G8929 Other chronic pain: Secondary | ICD-10-CM

## 2020-11-07 DIAGNOSIS — M549 Dorsalgia, unspecified: Secondary | ICD-10-CM

## 2020-11-07 NOTE — Progress Notes (Signed)
Patient ID: Angel Riddle, female   DOB: March 14, 1956, 64 y.o.   MRN: [id number]   Subjective:    Patient ID: Angel Riddle, female    DOB: [birth year], 64 y.o.   MRN: [id number]  HPI This visit occurred during the SARS-CoV-2 public health emergency.  Safety protocols were in place, including screening questions prior to the visit, additional usage of staff PPE, and extensive cleaning of exam room while observing appropriate contact time as indicated for disinfecting solutions.  Patient here for a scheduled follow up.  Here to follow up regarding her blood pressure, cholesterol and to follow regarding increased stress and anxiety.  Previously was experiencing some left posterior upper arm pain.  This has resolved.  Doing water aerobics.  Back pain limits her exercise at times.  S/p previous injection. No chest pain or sob reported.  No increased cough or congestion.  She is still smoking.  One pack will last her three days.  Discussed the need to quit.  She is taking delta 8 gummies at night.  Helps her relax and sleep.  Occasionally will take two benadryl. She is no longer on xanax.  Have discussed with her my desires for her to remain off xanax.  Feels she needs to have something to use if needed.  Husband is better.  Overall appears to be dong better.  She is having pain - left leg.  Request NCS.  Wants to wait until 02/2021.    Past Medical History:  Diagnosis Date  . Allergy   . Anxiety   . Arthritis   . Cancer (HCC)    basal cell nose  . Chronic back pain    degenerative disc disease  . Degenerative disc disease, lumbar   . Depression   . Dyspnea    with exertion / pain  . Gallstones   . GERD (gastroesophageal reflux disease)   . Heart murmur    followed by PCP  . Hx of colonic polyp   . Hx: UTI (urinary tract infection)   . Hypercholesterolemia   . Hypertension   . Panic attacks    H/O  . Wears dentures    partial upper and lower   Past Surgical History:  Procedure  Laterality Date  . ABDOMINAL HYSTERECTOMY  1992   partial  . BACK SURGERY     lumbar fusion  . CATARACT EXTRACTION Bilateral 2010,2011  . CHOLECYSTECTOMY  04-19-14  . COLONOSCOPY  07/02/11   DR. Byrnett  . COLONOSCOPY WITH PROPOFOL N/A 09/25/2015   Procedure: COLONOSCOPY WITH PROPOFOL;  Surgeon: Lucilla Lame, MD;  Location: [facility];  Service: Endoscopy;  Laterality: N/A;  . FOOT SURGERY Right 2009  . MOUTH SURGERY    . POLYPECTOMY  09/25/2015   Procedure: POLYPECTOMY;  Surgeon: Lucilla Lame, MD;  Location: [facility];  Service: Endoscopy;;  . skin surgery Left 08/27/13   Basal cell removal-left nostril  . TONSILLECTOMY AND ADENOIDECTOMY  1969   Family History  Problem Relation Age of Onset  . Hyperlipidemia Mother   . Hypertension Mother   . Alcohol abuse Father   . Hyperlipidemia Father   . Heart disease Father   . Diabetes Father   . Hyperlipidemia Sister   . Lung cancer Brother   . Hyperlipidemia Brother   . Breast cancer Neg Hx    Social History   Socioeconomic History  . Marital status: Married    Spouse name: Not on file  . Number of children: 2  .  Years of education: Not on file  . Highest education level: Not on file  Occupational History  . Not on file  Tobacco Use  . Smoking status: Current Every Day Smoker    Packs/day: 1.00    Years: 37.00    Pack years: 37.00    Types: Cigarettes    Start date: 08/07/2019  . Smokeless tobacco: Never Used  Vaping Use  . Vaping Use: Former  Substance and Sexual Activity  . Alcohol use: Yes    Alcohol/week: 1.0 standard drink    Types: 1 Glasses of wine per week  . Drug use: No  . Sexual activity: Not on file  Other Topics Concern  . Not on file  Social History Narrative  . Not on file   Social Determinants of Health   Financial Resource Strain:   . Difficulty of Paying Living Expenses: Not on file  Food Insecurity:   . Worried About Charity fundraiser in the Last Year: Not on file  . Ran  Out of Food in the Last Year: Not on file  Transportation Needs:   . Lack of Transportation (Medical): Not on file  . Lack of Transportation (Non-Medical): Not on file  Physical Activity:   . Days of Exercise per Week: Not on file  . Minutes of Exercise per Session: Not on file  Stress:   . Feeling of Stress : Not on file  Social Connections:   . Frequency of Communication with Friends and Family: Not on file  . Frequency of Social Gatherings with Friends and Family: Not on file  . Attends Religious Services: Not on file  . Active Member of Clubs or Organizations: Not on file  . Attends Archivist Meetings: Not on file  . Marital Status: Not on file    Outpatient Encounter Medications as of 11/07/2020  Medication Sig  . busPIRone (BUSPAR) 10 MG tablet Take 1 tablet (10 mg total) by mouth 2 (two) times daily as needed.  . ezetimibe (ZETIA) 10 MG tablet Take 1 tablet (10 mg total) by mouth daily.  Marland Kitchen FLUoxetine (PROZAC) 40 MG capsule Take 1 capsule (40 mg total) by mouth daily.  . fluticasone (FLONASE) 50 MCG/ACT nasal spray Place 1 spray into both nostrils daily as needed for allergies. Reported on 11/25/2015  . hydrochlorothiazide (MICROZIDE) 12.5 MG capsule Take 1 capsule (12.5 mg total) by mouth daily.  Marland Kitchen HYDROcodone-acetaminophen (NORCO) 7.5-325 MG tablet   . hydrocortisone (ANUSOL-HC) 25 MG suppository Place 1 suppository (25 mg total) rectally 2 (two) times daily. (Patient not taking: Reported on 05/21/2020)  . meloxicam (MOBIC) 15 MG tablet Take 1 tablet (15 mg total) by mouth daily.  Marland Kitchen omeprazole (PRILOSEC) 40 MG capsule Take 40 mg by mouth daily.   . pantoprazole (PROTONIX) 40 MG tablet Take 1 tablet (40 mg total) by mouth daily.  . potassium chloride (KLOR-CON) 10 MEQ tablet Take 1 tablet (10 mEq total) by mouth daily.  . pravastatin (PRAVACHOL) 10 MG tablet Take 1 tablet (10 mg total) by mouth daily.  Marland Kitchen tiZANidine (ZANAFLEX) 4 MG tablet Take 2-4 mg by mouth 3 times  daily as needed for muscle spasms   No facility-administered encounter medications on file as of 11/07/2020.    Review of Systems  Constitutional: Negative for appetite change and unexpected weight change.       Has lost weight.  Adjusted her diet.   HENT: Negative for congestion and sinus pressure.   Respiratory: Negative for cough, chest  tightness and shortness of breath.   Cardiovascular: Negative for chest pain, palpitations and leg swelling.  Gastrointestinal: Negative for abdominal pain, diarrhea, nausea and vomiting.  Genitourinary: Negative for difficulty urinating and dysuria.  Musculoskeletal: Positive for back pain. Negative for joint swelling.       Leg pain  Skin: Negative for color change and rash.  Neurological: Negative for dizziness, light-headedness and headaches.  Psychiatric/Behavioral: Negative for agitation and dysphoric mood.       Increased stress.         Objective:    Physical Exam Vitals reviewed.  Constitutional:      General: She is not in acute distress.    Appearance: Normal appearance.  HENT:     Head: Normocephalic and atraumatic.     Right Ear: External ear normal.     Left Ear: External ear normal.  Eyes:     General:        Right eye: No discharge.        Left eye: No discharge.     Conjunctiva/sclera: Conjunctivae normal.  Neck:     Thyroid: No thyromegaly.  Cardiovascular:     Rate and Rhythm: Normal rate and regular rhythm.  Pulmonary:     Effort: No respiratory distress.     Breath sounds: Normal breath sounds. No wheezing.  Abdominal:     General: Bowel sounds are normal.     Palpations: Abdomen is soft.     Tenderness: There is no abdominal tenderness.  Musculoskeletal:        General: No swelling or tenderness.     Cervical back: Neck supple. No tenderness.  Lymphadenopathy:     Cervical: No cervical adenopathy.  Skin:    Findings: No erythema or rash.  Neurological:     Mental Status: She is alert.  Psychiatric:          Mood and Affect: Mood normal.        Behavior: Behavior normal.     BP 128/70   Pulse 94   Temp 98.8 F (37.1 C) (Oral)   Resp 16   Ht _0  (1.651 m)   Wt 169 lb (76.7 kg)   SpO2 98%   BMI 28.12 kg/m  Wt Readings from Last 3 Encounters:  11/07/20 169 lb (76.7 kg)  07/04/20 182 lb 3.2 oz (82.6 kg)  05/21/20 187 lb 1.9 oz (84.9 kg)     Lab Results  Component Value Date   WBC 9.1 03/31/2020   HGB 14.0 03/31/2020   HCT 41.9 03/31/2020   PLT 360.0 03/31/2020   GLUCOSE 82 11/05/2020   CHOL 218 (H) 11/05/2020   TRIG 166.0 (H) 11/05/2020   HDL 50.60 11/05/2020   LDLDIRECT 170.0 03/31/2020   LDLCALC 134 (H) 11/05/2020   ALT 6 11/05/2020   AST 16 11/05/2020   NA 138 11/05/2020   K 3.7 11/05/2020   CL 102 11/05/2020   CREATININE 0.88 11/05/2020   BUN 20 11/05/2020   CO2 28 11/05/2020   TSH 1.18 03/31/2020   HGBA1C 6.0 11/05/2020    MR Lumbar Spine W Wo Contrast  Result Date: 05/12/2020 CLINICAL DATA:  Chronic left-sided back pain extending into the left lower extremity with weakness EXAM: MRI LUMBAR SPINE WITHOUT AND WITH CONTRAST TECHNIQUE: Multiplanar and multiecho pulse sequences of the lumbar spine were obtained without and with intravenous contrast. CONTRAST:  81m GADAVIST GADOBUTROL 1 MMOL/ML IV SOLN COMPARISON:  02/22/2017 FINDINGS: Segmentation:  Standard lumbar numbering Alignment: Grade 1 anterolisthesis  at L1-2, facet mediated and chronic. Vertebrae:  L3-L5 PLIF with solid appearing arthrodesis. Conus medullaris and cauda equina: Conus extends to the L1-2 level. Conus and cauda equina appear normal. Paraspinal and other soft tissues: Negative Disc levels: T12- L1: Minimal facet spurring.  No herniation or impingement L1-L2: Progressed degenerative facet spurring with mild anterolisthesis. Bulging of the disc. No neural impingement L2-L3: Progressed degenerative facet spurring and mild disc bulging. No neural impingement L3-L4: PLIF and laminectomy.  No impingement  L4-L5: PLIF and laminectomy.  No impingement L5-S1:Facet osteoarthritis with spurring and posterior left sided synovial cyst, progressed. Mild to moderate left foraminal narrowing. IMPRESSION: 1. L3-L5 PLIF with probable solid arthrodesis and no residual impingement. 2. 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 3. L5-S1 mild to moderate left foraminal narrowing. Electronically Signed   By: Monte Fantasia M.D.   On: 05/12/2020 04:52       Assessment & Plan:   Problem List Items Addressed This Visit    Personal history of tobacco use, presenting hazards to health    Discussed the need to quit smoking.  Has cut down.  She declines to quit at this time.  Follow.       Hypertension    Continue hctz.  Blood pressure doing well.  Follow pressures.  Follow metabolic panel.       Hyperglycemia    Low carb diet and exercise.  Follow met b and a1c.       Relevant Orders   Hemoglobin G6Y   Basic metabolic panel   Hypercholesterolemia    Low cholesterol diet and exercise.  On pravastatin and zetia.  Follow lipid panel and liver function tests.        Relevant Orders   CBC with Differential/Platelet   Hepatic function panel   Lipid panel   TSH   History of colonic polyps    Colonoscopy 09/2015 - sigmoid polyp and fissure, non bleeding internal hemorrhoids.  Recommended f/u in 5 years.        GERD (gastroesophageal reflux disease)    No upper symptoms reported.  On prilosec.        Depression    Was previously followed by Dr Nicolasa Ducking.  On prozac.  Doing better.  Off xanax.  Request to have something to take as needed.  D/w psychiatry.       Chronic back pain    S/p surgery.  Increased pain - back and left leg pain.  Previously saw [facility].  Discussed with her today.  S/p injection.  She request NCS to evaluated left leg.  Wants to hold on scheduling (wants to schedule in 02/2021).        Aortic atherosclerosis (HCC)    Continue  pravastatin and zetia.       Anxiety    Off buspar.  Did not feel helped.  Has tried hydroxyzine.  Request to have something to take as needed.  D/w psychiatry.            Einar Pheasant, MD

## 2020-11-08 NOTE — Assessment & Plan Note (Signed)
Colonoscopy 09/2015 - sigmoid polyp and fissure, non bleeding internal hemorrhoids.  Recommended f/u in 5 years.

## 2020-11-08 NOTE — Assessment & Plan Note (Signed)
Was previously followed by Dr Nicolasa Ducking.  On prozac.  Doing better.  Off xanax.  Request to have something to take as needed.  D/w psychiatry.

## 2020-11-08 NOTE — Assessment & Plan Note (Signed)
Continue pravastatin and zetia.

## 2020-11-08 NOTE — Assessment & Plan Note (Signed)
Low carb diet and exercise.  Follow met b and a1c.  

## 2020-11-08 NOTE — Assessment & Plan Note (Signed)
No upper symptoms reported. On prilosec.  

## 2020-11-08 NOTE — Assessment & Plan Note (Signed)
Off buspar.  Did not feel helped.  Has tried hydroxyzine.  Request to have something to take as needed.  D/w psychiatry.

## 2020-11-08 NOTE — Assessment & Plan Note (Signed)
Discussed the need to quit smoking.  Has cut down.  She declines to quit at this time.  Follow.

## 2020-11-08 NOTE — Assessment & Plan Note (Signed)
Continue hctz.  Blood pressure doing well.  Follow pressures.  Follow metabolic panel.  

## 2020-11-08 NOTE — Assessment & Plan Note (Signed)
S/p surgery.  Increased pain - back and left leg pain.  Previously saw [facility].  Discussed with her today.  S/p injection.  She request NCS to evaluated left leg.  Wants to hold on scheduling (wants to schedule in 02/2021).

## 2020-11-08 NOTE — Assessment & Plan Note (Signed)
Low cholesterol diet and exercise.  On pravastatin and zetia.  Follow lipid panel and liver function tests.

## 2020-11-24 ENCOUNTER — Other Ambulatory Visit: Payer: Self-pay | Admitting: Internal Medicine

## 2020-11-27 ENCOUNTER — Telehealth: Payer: Self-pay

## 2020-11-27 NOTE — Telephone Encounter (Signed)
I tried to call patient at available number, but no VM was set up to LM.

## 2020-11-27 NOTE — Telephone Encounter (Signed)
Pt tested positive for covid on 11/24/20. She just wanted to know if there was anything that she needed to do other than OTC meds. She has a cough-fever has subsided.

## 2020-12-09 ENCOUNTER — Other Ambulatory Visit: Payer: Self-pay | Admitting: Internal Medicine

## 2020-12-09 NOTE — Telephone Encounter (Signed)
Patient confirmed doing better. No lingering symptoms. Will let us know if she needs anything,

## 2020-12-19 ENCOUNTER — Other Ambulatory Visit: Payer: Self-pay | Admitting: Internal Medicine

## 2021-01-03 ENCOUNTER — Encounter: Payer: Self-pay | Admitting: *Deleted

## 2021-01-28 ENCOUNTER — Telehealth: Payer: Self-pay

## 2021-01-28 DIAGNOSIS — F17200 Nicotine dependence, unspecified, uncomplicated: Secondary | ICD-10-CM

## 2021-01-28 DIAGNOSIS — Z87891 Personal history of nicotine dependence: Secondary | ICD-10-CM

## 2021-01-28 DIAGNOSIS — Z122 Encounter for screening for malignant neoplasm of respiratory organs: Secondary | ICD-10-CM

## 2021-01-29 NOTE — Telephone Encounter (Signed)
Current smoker, 37.5 pack year history.

## 2021-02-03 ENCOUNTER — Other Ambulatory Visit: Payer: Self-pay | Admitting: Internal Medicine

## 2021-02-11 ENCOUNTER — Other Ambulatory Visit: Payer: Self-pay

## 2021-02-11 ENCOUNTER — Ambulatory Visit
Admission: RE | Admit: 2021-02-11 | Discharge: 2021-02-11 | Disposition: A | Discharge status: Home / Self Care | Payer: Medicare Other | Source: Ambulatory Visit | Attending: Oncology | Admitting: Oncology

## 2021-02-11 DIAGNOSIS — Z122 Encounter for screening for malignant neoplasm of respiratory organs: Secondary | ICD-10-CM | POA: Diagnosis present

## 2021-02-11 DIAGNOSIS — F17200 Nicotine dependence, unspecified, uncomplicated: Secondary | ICD-10-CM | POA: Diagnosis present

## 2021-02-11 DIAGNOSIS — Z87891 Personal history of nicotine dependence: Secondary | ICD-10-CM | POA: Diagnosis not present

## 2021-02-18 ENCOUNTER — Encounter: Payer: Self-pay | Admitting: *Deleted

## 2021-02-18 ENCOUNTER — Other Ambulatory Visit: Payer: Self-pay | Admitting: Internal Medicine

## 2021-02-18 DIAGNOSIS — Z1231 Encounter for screening mammogram for malignant neoplasm of breast: Secondary | ICD-10-CM

## 2021-03-04 ENCOUNTER — Other Ambulatory Visit: Payer: 59

## 2021-03-05 ENCOUNTER — Other Ambulatory Visit: Payer: Self-pay

## 2021-03-05 ENCOUNTER — Other Ambulatory Visit (INDEPENDENT_AMBULATORY_CARE_PROVIDER_SITE_OTHER): Payer: Medicare Other

## 2021-03-05 DIAGNOSIS — R739 Hyperglycemia, unspecified: Secondary | ICD-10-CM

## 2021-03-05 DIAGNOSIS — E7800 Pure hypercholesterolemia, unspecified: Secondary | ICD-10-CM | POA: Diagnosis not present

## 2021-03-05 LAB — CBC WITH DIFFERENTIAL/PLATELET
Basophils Absolute: 0 10*3/uL (ref 0.0–0.1)
Basophils Relative: 0.4 % (ref 0.0–3.0)
Eosinophils Absolute: 0.1 10*3/uL (ref 0.0–0.7)
Eosinophils Relative: 1.2 % (ref 0.0–5.0)
HCT: 40.6 % (ref 36.0–46.0)
Hemoglobin: 13.6 g/dL (ref 12.0–15.0)
Lymphocytes Relative: 24.4 % (ref 12.0–46.0)
Lymphs Abs: 2.7 10*3/uL (ref 0.7–4.0)
MCHC: 33.5 g/dL (ref 30.0–36.0)
MCV: 86.1 fl (ref 78.0–100.0)
Monocytes Absolute: 0.7 10*3/uL (ref 0.1–1.0)
Monocytes Relative: 6.6 % (ref 3.0–12.0)
Neutro Abs: 7.4 10*3/uL (ref 1.4–7.7)
Neutrophils Relative %: 67.4 % (ref 43.0–77.0)
Platelets: 299 10*3/uL (ref 150.0–400.0)
RBC: 4.72 Mil/uL (ref 3.87–5.11)
RDW: 13.6 % (ref 11.5–15.5)
WBC: 11 10*3/uL — ABNORMAL HIGH (ref 4.0–10.5)

## 2021-03-05 LAB — BASIC METABOLIC PANEL
BUN: 19 mg/dL (ref 6–23)
CO2: 29 mEq/L (ref 19–32)
Calcium: 9.5 mg/dL (ref 8.4–10.5)
Chloride: 101 mEq/L (ref 96–112)
Creatinine, Ser: 0.82 mg/dL (ref 0.40–1.20)
GFR: 75.24 mL/min (ref >60.00)
Glucose, Bld: 93 mg/dL (ref 70–99)
Potassium: 4 mEq/L (ref 3.5–5.1)
Sodium: 139 mEq/L (ref 135–145)

## 2021-03-05 LAB — LIPID PANEL
Cholesterol: 241 mg/dL — ABNORMAL HIGH (ref 0–200)
HDL: 50.3 mg/dL (ref >39.00)
NonHDL: 191
Total CHOL/HDL Ratio: 5
Triglycerides: 205 mg/dL — ABNORMAL HIGH (ref 0.0–149.0)
VLDL: 41 mg/dL — ABNORMAL HIGH (ref 0.0–40.0)

## 2021-03-05 LAB — HEPATIC FUNCTION PANEL
ALT: 10 U/L (ref 0–35)
AST: 19 U/L (ref 0–37)
Albumin: 4.2 g/dL (ref 3.5–5.2)
Alkaline Phosphatase: 105 U/L (ref 39–117)
Bilirubin, Direct: 0.1 mg/dL (ref 0.0–0.3)
Total Bilirubin: 0.5 mg/dL (ref 0.2–1.2)
Total Protein: 6.8 g/dL (ref 6.0–8.3)

## 2021-03-05 LAB — HEMOGLOBIN A1C: Hgb A1c MFr Bld: 6 % (ref 4.6–6.5)

## 2021-03-05 LAB — TSH: TSH: 1.64 u[IU]/mL (ref 0.35–4.50)

## 2021-03-05 LAB — LDL CHOLESTEROL, DIRECT: Direct LDL: 158 mg/dL

## 2021-03-09 ENCOUNTER — Other Ambulatory Visit: Payer: Self-pay

## 2021-03-09 ENCOUNTER — Ambulatory Visit (INDEPENDENT_AMBULATORY_CARE_PROVIDER_SITE_OTHER): Payer: Medicare Other | Admitting: Internal Medicine

## 2021-03-09 ENCOUNTER — Other Ambulatory Visit (HOSPITAL_COMMUNITY)
Admission: RE | Admit: 2021-03-09 | Discharge: 2021-03-09 | Disposition: A | Discharge status: Home / Self Care | Payer: Medicare Other | Source: Ambulatory Visit | Attending: Internal Medicine | Admitting: Internal Medicine

## 2021-03-09 VITALS — BP 128/72 | HR 75 | Temp 97.5°F | Resp 16 | Ht 65.0 in | Wt 170.0 lb

## 2021-03-09 DIAGNOSIS — F419 Anxiety disorder, unspecified: Secondary | ICD-10-CM | POA: Diagnosis not present

## 2021-03-09 DIAGNOSIS — Z124 Encounter for screening for malignant neoplasm of cervix: Secondary | ICD-10-CM | POA: Diagnosis not present

## 2021-03-09 DIAGNOSIS — Z87891 Personal history of nicotine dependence: Secondary | ICD-10-CM

## 2021-03-09 DIAGNOSIS — M549 Dorsalgia, unspecified: Secondary | ICD-10-CM | POA: Diagnosis not present

## 2021-03-09 DIAGNOSIS — Z86010 Personal history of colonic polyps: Secondary | ICD-10-CM | POA: Diagnosis not present

## 2021-03-09 DIAGNOSIS — N8110 Cystocele, unspecified: Secondary | ICD-10-CM

## 2021-03-09 DIAGNOSIS — E7800 Pure hypercholesterolemia, unspecified: Secondary | ICD-10-CM

## 2021-03-09 DIAGNOSIS — Z1151 Encounter for screening for human papillomavirus (HPV): Secondary | ICD-10-CM | POA: Diagnosis not present

## 2021-03-09 DIAGNOSIS — I10 Essential (primary) hypertension: Secondary | ICD-10-CM | POA: Diagnosis not present

## 2021-03-09 DIAGNOSIS — G8929 Other chronic pain: Secondary | ICD-10-CM

## 2021-03-09 DIAGNOSIS — H532 Diplopia: Secondary | ICD-10-CM

## 2021-03-09 DIAGNOSIS — R739 Hyperglycemia, unspecified: Secondary | ICD-10-CM

## 2021-03-09 DIAGNOSIS — I700 Atherosclerosis of aorta: Secondary | ICD-10-CM | POA: Diagnosis not present

## 2021-03-09 DIAGNOSIS — F32A Depression, unspecified: Secondary | ICD-10-CM | POA: Diagnosis not present

## 2021-03-09 DIAGNOSIS — K219 Gastro-esophageal reflux disease without esophagitis: Secondary | ICD-10-CM | POA: Diagnosis not present

## 2021-03-09 MED ORDER — GABAPENTIN 300 MG PO CAPS: 300.0000 mg | ORAL_CAPSULE | Freq: Every day | ORAL | 1 refills | Status: DC

## 2021-03-09 NOTE — Progress Notes (Signed)
Patient ID: Angel Riddle, female   DOB: [birth year], 65 y.o.   MRN: [id number]   Subjective:    Patient ID: Angel Riddle, female    DOB: [birth year], 65 y.o.   MRN: [id number]  HPI This visit occurred during the SARS-CoV-2 public health emergency.  Safety protocols were in place, including screening questions prior to the visit, additional usage of staff PPE, and extensive cleaning of exam room while observing appropriate contact time as indicated for disinfecting solutions.  Patient here for a scheduled physical.  Was changed to a follow up appt. She reports increased stress - increased stress with her husband's health issues.  States she is not sleeping well.  Taking prozac.  Previously seeing psychiatry.  Request to have xanax refilled.  Have discussed with her regarding remaining off xanax and trying to find other medication for sleep and to help with anxiety.  Previously took xanax daily.  Off now.  Would prefer not to restart.  Increased pain.  Discussed trial of gabapentin.  No chest pain. Breathing stable.  No acid reflux reported.  No abdominal pain.  Bowels moving.  Concern regarding - bladder fallen.  No vaginal discharge.  Also reported episodes - double vision.  Not sure if both eyes involved.  Has been seeing ophthalmology.  No headache.  No slurred speech.  No facial drooping.   Past Medical History:  Diagnosis Date  . Allergy   . Anxiety   . Arthritis   . Cancer (HCC)    basal cell nose  . Chronic back pain    degenerative disc disease  . Degenerative disc disease, lumbar   . Depression   . Dyspnea    with exertion / pain  . Gallstones   . GERD (gastroesophageal reflux disease)   . Heart murmur    followed by PCP  . Hx of colonic polyp   . Hx: UTI (urinary tract infection)   . Hypercholesterolemia   . Hypertension   . Panic attacks    H/O  . Wears dentures    partial upper and lower   Past Surgical History:  Procedure Laterality Date  . ABDOMINAL HYSTERECTOMY   1992   partial  . BACK SURGERY     lumbar fusion  . CATARACT EXTRACTION Bilateral 2010,2011  . CHOLECYSTECTOMY  04-19-14  . COLONOSCOPY  07/02/11   DR. Byrnett  . COLONOSCOPY WITH PROPOFOL N/A 09/25/2015   Procedure: COLONOSCOPY WITH PROPOFOL;  Surgeon: Lucilla Lame, MD;  Location: [facility];  Service: Endoscopy;  Laterality: N/A;  . FOOT SURGERY Right 2009  . MOUTH SURGERY    . POLYPECTOMY  09/25/2015   Procedure: POLYPECTOMY;  Surgeon: Lucilla Lame, MD;  Location: [facility];  Service: Endoscopy;;  . skin surgery Left 08/27/13   Basal cell removal-left nostril  . TONSILLECTOMY AND ADENOIDECTOMY  1969   Family History  Problem Relation Age of Onset  . Hyperlipidemia Mother   . Hypertension Mother   . Alcohol abuse Father   . Hyperlipidemia Father   . Heart disease Father   . Diabetes Father   . Hyperlipidemia Sister   . Lung cancer Brother   . Hyperlipidemia Brother   . Breast cancer Neg Hx    Social History   Socioeconomic History  . Marital status: Married    Spouse name: Not on file  . Number of children: 2  . Years of education: Not on file  . Highest education level: Not on file  Occupational History  .  Not on file  Tobacco Use  . Smoking status: Current Every Day Smoker    Packs/day: 1.00    Years: 37.00    Pack years: 37.00    Types: Cigarettes    Start date: 08/07/2019  . Smokeless tobacco: Never Used  Vaping Use  . Vaping Use: Former  Substance and Sexual Activity  . Alcohol use: Yes    Alcohol/week: 1.0 standard drink    Types: 1 Glasses of wine per week  . Drug use: No  . Sexual activity: Not on file  Other Topics Concern  . Not on file  Social History Narrative  . Not on file   Social Determinants of Health   Financial Resource Strain: Not on file  Food Insecurity: Not on file  Transportation Needs: Not on file  Physical Activity: Not on file  Stress: Not on file  Social Connections: Not on file    Outpatient Encounter  Medications as of 03/09/2021  Medication Sig  . aspirin EC 81 MG tablet Take 1 tablet (81 mg total) by mouth daily. Swallow whole.  . gabapentin (NEURONTIN) 300 MG capsule Take 1 capsule (300 mg total) by mouth at bedtime.  Marland Kitchen ezetimibe (ZETIA) 10 MG tablet Take 1 tablet (10 mg total) by mouth daily.  . hydrochlorothiazide (MICROZIDE) 12.5 MG capsule Take 1 capsule (12.5 mg total) by mouth daily.  Marland Kitchen HYDROcodone-acetaminophen (NORCO) 7.5-325 MG tablet   . meloxicam (MOBIC) 15 MG tablet Take 1 tablet (15 mg total) by mouth daily.  Marland Kitchen omeprazole (PRILOSEC) 40 MG capsule Take 40 mg by mouth daily.   . pantoprazole (PROTONIX) 40 MG tablet Take 1 tablet (40 mg total) by mouth daily.  Marland Kitchen tiZANidine (ZANAFLEX) 4 MG tablet Take 2-4 mg by mouth 3 times daily as needed for muscle spasms  . [DISCONTINUED] busPIRone (BUSPAR) 10 MG tablet Take 1 tablet (10 mg total) by mouth 2 (two) times daily as needed.  . [DISCONTINUED] FLUoxetine (PROZAC) 40 MG capsule Take 1 capsule (40 mg total) by mouth daily.  . [DISCONTINUED] fluticasone (FLONASE) 50 MCG/ACT nasal spray Place 1 spray into both nostrils daily as needed for allergies. Reported on 11/25/2015  . [DISCONTINUED] hydrocortisone (ANUSOL-HC) 25 MG suppository Place 1 suppository (25 mg total) rectally 2 (two) times daily. (Patient not taking: Reported on 05/21/2020)  . [DISCONTINUED] potassium chloride (KLOR-CON) 10 MEQ tablet Take 1 tablet (10 mEq total) by mouth daily.  . [DISCONTINUED] pravastatin (PRAVACHOL) 10 MG tablet Take 1 tablet (10 mg total) by mouth daily.   No facility-administered encounter medications on file as of 03/09/2021.    Review of Systems  Constitutional: Negative for appetite change and unexpected weight change.  HENT: Negative for congestion and sinus pressure.   Respiratory: Negative for cough, chest tightness and shortness of breath.   Cardiovascular: Negative for chest pain, palpitations and leg swelling.  Gastrointestinal: Negative  for abdominal pain, diarrhea, nausea and vomiting.  Genitourinary: Negative for difficulty urinating and dysuria.  Musculoskeletal: Negative for joint swelling.       Leg pain.  Hip pain.   Skin: Negative for color change and rash.  Neurological: Negative for dizziness, light-headedness and headaches.  Psychiatric/Behavioral: Negative for agitation and dysphoric mood.       Objective:    Physical Exam Vitals reviewed.  Constitutional:      General: She is not in acute distress.    Appearance: Normal appearance.  HENT:     Head: Normocephalic and atraumatic.     Right Ear:  External ear normal.     Left Ear: External ear normal.  Eyes:     General: No scleral icterus.       Right eye: No discharge.        Left eye: No discharge.     Conjunctiva/sclera: Conjunctivae normal.  Neck:     Thyroid: No thyromegaly.  Cardiovascular:     Rate and Rhythm: Normal rate and regular rhythm.  Pulmonary:     Effort: No respiratory distress.     Breath sounds: Normal breath sounds. No wheezing.  Abdominal:     General: Bowel sounds are normal.     Palpations: Abdomen is soft.     Tenderness: There is no abdominal tenderness.  Genitourinary:    Comments: Normal external genitalia.  Vaginal vault without lesions.  Pap smear performed.  Could not appreciate any adnexal masses or tenderness.  Vaginal wall - weakness.  Musculoskeletal:        General: No swelling or tenderness.     Cervical back: Neck supple. No tenderness.  Lymphadenopathy:     Cervical: No cervical adenopathy.  Skin:    Findings: No erythema or rash.  Neurological:     Mental Status: She is alert.  Psychiatric:        Mood and Affect: Mood normal.        Behavior: Behavior normal.     BP 128/72   Pulse 75   Temp (!) 97.5 F (36.4 C) (Oral)   Resp 16   Ht _0  (1.651 m)   Wt 170 lb (77.1 kg)   SpO2 98%   BMI 28.29 kg/m  Wt Readings from Last 3 Encounters:  03/09/21 170 lb (77.1 kg)  02/11/21 165 lb (74.8  kg)  11/07/20 169 lb (76.7 kg)     Lab Results  Component Value Date   WBC 11.0 (H) 03/05/2021   HGB 13.6 03/05/2021   HCT 40.6 03/05/2021   PLT 299.0 03/05/2021   GLUCOSE 93 03/05/2021   CHOL 241 (H) 03/05/2021   TRIG 205.0 (H) 03/05/2021   HDL 50.30 03/05/2021   LDLDIRECT 158.0 03/05/2021   LDLCALC 134 (H) 11/05/2020   ALT 10 03/05/2021   AST 19 03/05/2021   NA 139 03/05/2021   K 4.0 03/05/2021   CL 101 03/05/2021   CREATININE 0.82 03/05/2021   BUN 19 03/05/2021   CO2 29 03/05/2021   TSH 1.64 03/05/2021   HGBA1C 6.0 03/05/2021    CT CHEST LUNG CANCER SCREENING LOW DOSE WO CONTRAST  Result Date: 02/12/2021 CLINICAL DATA:  65 year old female current smoker with 38 pack-year history of smoking. Lung cancer screening examination. EXAM: CT CHEST WITHOUT CONTRAST LOW-DOSE FOR LUNG CANCER SCREENING TECHNIQUE: Multidetector CT imaging of the chest was performed following the standard protocol without IV contrast. COMPARISON:  Low-dose lung cancer screening chest CT 02/01/2020. FINDINGS: Cardiovascular: Heart size is normal. There is no significant pericardial fluid, thickening or pericardial calcification. There is aortic atherosclerosis, as well as atherosclerosis of the great vessels of the mediastinum and the coronary arteries, including calcified atherosclerotic plaque in the left main, left circumflex and right coronary arteries. Mediastinum/Nodes: No pathologically enlarged mediastinal or hilar lymph nodes. Please note that accurate exclusion of hilar adenopathy is limited on noncontrast CT scans. Esophagus is unremarkable in appearance. No axillary lymphadenopathy. Lungs/Pleura: Multiple small pulmonary nodules are noted in the lungs, largest of which is in the periphery of the right upper lobe (axial image 86 of series 3), with a volume derived  mean diameter of 3.2 mm. No other larger more suspicious appearing pulmonary nodules or masses are noted. No acute consolidative airspace  disease. No pleural effusions. Diffuse bronchial wall thickening with very mild centrilobular and paraseptal emphysema. Upper Abdomen: Aortic atherosclerosis.  Status post cholecystectomy. Musculoskeletal: There are no aggressive appearing lytic or blastic lesions noted in the visualized portions of the skeleton. IMPRESSION: 1. Lung-RADS 2S, benign appearance or behavior. Continue annual screening with low-dose chest CT without contrast in 12 months. 2. The "S" modifier above refers to potentially clinically significant non lung cancer related findings. Specifically, there is aortic atherosclerosis, in addition to left main and 2 vessel coronary artery disease. Please note that although the presence of coronary artery calcium documents the presence of coronary artery disease, the severity of this disease and any potential stenosis cannot be assessed on this non-gated CT examination. Assessment for potential risk factor modification, dietary therapy or pharmacologic therapy may be warranted, if clinically indicated. 3. Mild diffuse bronchial wall thickening with very mild centrilobular and paraseptal emphysema; imaging findings suggestive of underlying COPD. Aortic Atherosclerosis (ICD10-I70.0) and Emphysema (ICD10-J43.9). Electronically Signed   By: Vinnie Langton M.D.   On: 02/12/2021 11:15       Assessment & Plan:   Problem List Items Addressed This Visit    Anxiety    Feels buspar did not help.  Continues on prozac.  Trial of gabapentin as directed.  Follow.       Relevant Orders   Ambulatory referral to Psychiatry   Aortic atherosclerosis ([place])    Continue pravastatin and zetia.  Follow.       Relevant Medications   aspirin EC 81 MG tablet   Chronic back pain    S/p surgery. Persistent pain.  Trial of gabapentin.  Follow.       Relevant Medications   gabapentin (NEURONTIN) 300 MG capsule   aspirin EC 81 MG tablet   Depression    Was previously followed by psychiatry.  Currently on  prozac.  Increased stress as outlined.  Previously on xanax daily.  Discussed preference to use something other than xanax to help with sleep, etc.  Trial of gabapentin as directed.  Hopefully this will help with pain and sleep.  Follow.  Refer back to psychiatry.       Relevant Orders   Ambulatory referral to Psychiatry   Double vision    Acute episodes of double visions as outlined.  Seeing ophthalmology.  Discussed the need for f/u eye evaluation.  Question of possible TIA.  Start ECASA 63m q day.  Check carotid ultrasound and MRI.  Any return of symptoms or worsening problems, needs evaluation.       Relevant Orders   MR Brain W Wo Contrast   VAS [place]   Female bladder prolapse    Prolapse noted on exam.  Will notify me if desires referral.       GERD (gastroesophageal reflux disease)    No upper symptoms reported.  Continue prilosec.       History of colonic polyps    Colonoscopy 09/2015 - sigmoid polyp and fissure, non bleeding internal hemorrhoids.  Recommended f/u in 5 years. Once above issues sorted through - needs f/u.       Hypercholesterolemia    Continue pravastatin and zetia.  Low cholesterol diet and exercise.  Unable to increase statin - intolerance.  Discussed possible repatha.  Agreeable to CCM referral. Follow lipid panel and liver function tests.  Relevant Medications   aspirin EC 81 MG tablet   Other Relevant Orders   AMB Referral to [facility]   Hyperglycemia    Low carb diet and exercise.  Follow met b and a1c.       Hypertension    Continue hctz.  Blood pressure doing well.  Follow pressures.  Follow metabolic panel.       Relevant Medications   aspirin EC 81 MG tablet   Personal history of tobacco use, presenting hazards to health    Has declined to quit.  Follow.        Other Visit Diagnoses    Cervical cancer screening    -  Primary   Relevant Orders   Cytology - PAP( [place]) (Completed)      I spent  over 40 minutes with the patient and more than 50% of the time was spent in consultation regarding the above.  Time spent discussing current symptoms and concerns.  Time also spent discussing treatment and further evaluation.   Einar Pheasant, MD

## 2021-03-10 ENCOUNTER — Other Ambulatory Visit: Payer: Self-pay | Admitting: Internal Medicine

## 2021-03-10 LAB — CYTOLOGY - PAP
Comment: NEGATIVE
Diagnosis: NEGATIVE
High risk HPV: NEGATIVE

## 2021-03-11 ENCOUNTER — Ambulatory Visit
Admission: RE | Admit: 2021-03-11 | Discharge: 2021-03-11 | Disposition: A | Discharge status: Home / Self Care | Payer: Medicare Other | Source: Ambulatory Visit | Attending: Internal Medicine | Admitting: Internal Medicine

## 2021-03-11 ENCOUNTER — Other Ambulatory Visit: Payer: Self-pay

## 2021-03-11 DIAGNOSIS — Z1231 Encounter for screening mammogram for malignant neoplasm of breast: Secondary | ICD-10-CM | POA: Diagnosis not present

## 2021-03-14 ENCOUNTER — Encounter: Payer: Self-pay | Admitting: Internal Medicine

## 2021-03-14 DIAGNOSIS — N8110 Cystocele, unspecified: Secondary | ICD-10-CM | POA: Insufficient documentation

## 2021-03-14 DIAGNOSIS — H532 Diplopia: Secondary | ICD-10-CM | POA: Insufficient documentation

## 2021-03-14 MED ORDER — ASPIRIN EC 81 MG PO TBEC: 81.0000 mg | DELAYED_RELEASE_TABLET | Freq: Every day | ORAL | 0 refills | Status: DC

## 2021-03-14 NOTE — Assessment & Plan Note (Signed)
Prolapse noted on exam.  Will notify me if desires referral.

## 2021-03-14 NOTE — Assessment & Plan Note (Signed)
Continue hctz.  Blood pressure doing well.  Follow pressures.  Follow metabolic panel.  

## 2021-03-14 NOTE — Assessment & Plan Note (Signed)
Has declined to quit.  Follow.  

## 2021-03-14 NOTE — Assessment & Plan Note (Signed)
Feels buspar did not help.  Continues on prozac.  Trial of gabapentin as directed.  Follow.

## 2021-03-14 NOTE — Assessment & Plan Note (Signed)
Continue pravastatin and zetia.  Follow.

## 2021-03-14 NOTE — Assessment & Plan Note (Signed)
Acute episodes of double visions as outlined.  Seeing ophthalmology.  Discussed the need for f/u eye evaluation.  Question of possible TIA.  Start ECASA 81mg  q day.  Check carotid ultrasound and MRI.  Any return of symptoms or worsening problems, needs evaluation.

## 2021-03-14 NOTE — Assessment & Plan Note (Addendum)
Continue pravastatin and zetia.  Low cholesterol diet and exercise.  Unable to increase statin - intolerance.  Discussed possible repatha.  Agreeable to CCM referral. Follow lipid panel and liver function tests.

## 2021-03-14 NOTE — Assessment & Plan Note (Signed)
Colonoscopy 09/2015 - sigmoid polyp and fissure, non bleeding internal hemorrhoids.  Recommended f/u in 5 years. Once above issues sorted through - needs f/u.

## 2021-03-14 NOTE — Assessment & Plan Note (Signed)
Low carb diet and exercise.  Follow met b and a1c.  

## 2021-03-14 NOTE — Assessment & Plan Note (Signed)
S/p surgery. Persistent pain.  Trial of gabapentin.  Follow.

## 2021-03-14 NOTE — Assessment & Plan Note (Signed)
No upper symptoms reported.  Continue prilosec.  

## 2021-03-14 NOTE — Assessment & Plan Note (Signed)
Was previously followed by psychiatry.  Currently on prozac.  Increased stress as outlined.  Previously on xanax daily.  Discussed preference to use something other than xanax to help with sleep, etc.  Trial of gabapentin as directed.  Hopefully this will help with pain and sleep.  Follow.  Refer back to psychiatry.

## 2021-03-16 ENCOUNTER — Telehealth: Payer: Self-pay

## 2021-03-16 ENCOUNTER — Encounter: Payer: Self-pay | Admitting: Internal Medicine

## 2021-03-16 NOTE — Telephone Encounter (Signed)
Agree with both being evaluated today.  I know that Ms Wendell husband sees Dr Olivia Mackie, but if he hit his head, he needs to be seen today.

## 2021-03-16 NOTE — Chronic Care Management (AMB) (Signed)
  Chronic Care Management   Note  03/16/2021 Name: Angel Riddle MRN: [id number] DOB: 01-27-1956  Angel Riddle is a 65 y.o. year old female who is a primary care patient of Einar Pheasant, MD. I reached out to [place] by phone today in response to a referral sent by [name]'s PCP, Einar Pheasant, MD     [name] was given information about Chronic Care Management services today including:  1. CCM service includes personalized support from designated clinical staff supervised by her physician, including individualized plan of care and coordination with other care providers 2. 24/7 contact phone numbers for assistance for urgent and routine care needs. 3. Service will only be billed when office clinical staff spend 20 minutes or more in a month to coordinate care. 4. Only one practitioner may furnish and bill the service in a calendar month. 5. The patient may stop CCM services at any time (effective at the end of the month) by phone call to the office staff. 6. The patient will be responsible for cost sharing (co-pay) of up to 20% of the service fee (after annual deductible is met).  Patient agreed to services and verbal consent obtained.   Follow up plan: Telephone appointment with care management team member scheduled for:04/10/2021  Noreene Larsson, [place], [place], [place] [address] Direct Dial: [phone] [email or web address]: [email or web address]

## 2021-03-16 NOTE — Telephone Encounter (Signed)
Called and spoke with the Patient's wife. She had sent another mychart message about COVID related symptoms for herself to Dr Nicki Reaper. Informed that with her symptoms the Patient would not be able to be seen in person on Wednesday.   Gave the Patient information on [facility], states they could both be seen today. They could both be evaluated in person, tested, and if the Patient needed imaging they could do this as well.   Patient's wife verbalized understanding and was given the walk in number, [phone]. She and the Patient  will go to be seen today.   Informed that once they had been evaluated and test result came back she could call back in to reschedule Patient's lab appointment. Both appointments for Wednesday canceled.

## 2021-03-16 NOTE — Telephone Encounter (Signed)
Called and spoke with the Patient. Informed that with these symptoms we would be unable to see either her or her husband in person.   Informed that the soonest available appointment would be Wednesday.   Gave the Patient information on [facility], states they could both be seen today. They could both be evaluated in person, tested, and if her husband needed imaging they could do this as well.   Patient verbalized understanding and was given the walk in number, [phone].   She and her husband will go to be seen today.

## 2021-04-01 ENCOUNTER — Other Ambulatory Visit: Payer: Self-pay

## 2021-04-01 ENCOUNTER — Ambulatory Visit (INDEPENDENT_AMBULATORY_CARE_PROVIDER_SITE_OTHER): Payer: Medicare Other

## 2021-04-01 DIAGNOSIS — H532 Diplopia: Secondary | ICD-10-CM | POA: Diagnosis not present

## 2021-04-07 ENCOUNTER — Ambulatory Visit
Admission: RE | Admit: 2021-04-07 | Discharge: 2021-04-07 | Disposition: A | Discharge status: Home / Self Care | Payer: Medicare Other | Source: Ambulatory Visit | Attending: Internal Medicine | Admitting: Internal Medicine

## 2021-04-07 ENCOUNTER — Other Ambulatory Visit: Payer: Self-pay

## 2021-04-07 ENCOUNTER — Other Ambulatory Visit: Payer: Self-pay | Admitting: Internal Medicine

## 2021-04-07 DIAGNOSIS — H532 Diplopia: Secondary | ICD-10-CM | POA: Diagnosis present

## 2021-04-07 MED ORDER — GADOBUTROL 1 MMOL/ML IV SOLN
7.5000 mL | Freq: Once | INTRAVENOUS | Status: AC | PRN
  Administered 2021-04-07: 7.5 mL via INTRAVENOUS

## 2021-04-08 ENCOUNTER — Telehealth: Payer: Self-pay | Admitting: Internal Medicine

## 2021-04-08 ENCOUNTER — Other Ambulatory Visit: Payer: Self-pay | Admitting: Internal Medicine

## 2021-04-08 DIAGNOSIS — H532 Diplopia: Secondary | ICD-10-CM

## 2021-04-08 NOTE — Progress Notes (Signed)
Order placed for neurology referral.   

## 2021-04-08 NOTE — Telephone Encounter (Signed)
See result note.  

## 2021-04-08 NOTE — Telephone Encounter (Signed)
Pt called to get MRI results  

## 2021-04-10 ENCOUNTER — Ambulatory Visit (INDEPENDENT_AMBULATORY_CARE_PROVIDER_SITE_OTHER): Payer: Medicare Other | Admitting: Pharmacist

## 2021-04-10 DIAGNOSIS — E7800 Pure hypercholesterolemia, unspecified: Secondary | ICD-10-CM | POA: Diagnosis not present

## 2021-04-10 DIAGNOSIS — I10 Essential (primary) hypertension: Secondary | ICD-10-CM

## 2021-04-10 DIAGNOSIS — I700 Atherosclerosis of aorta: Secondary | ICD-10-CM

## 2021-04-10 DIAGNOSIS — F32A Depression, unspecified: Secondary | ICD-10-CM | POA: Diagnosis not present

## 2021-04-10 DIAGNOSIS — H532 Diplopia: Secondary | ICD-10-CM

## 2021-04-10 DIAGNOSIS — G8929 Other chronic pain: Secondary | ICD-10-CM

## 2021-04-10 NOTE — Chronic Care Management (AMB) (Signed)
Chronic Care Management Pharmacy Note  04/10/2021 Name:  Angel Riddle MRN:  [id number] DOB:  04/13/1956  Subjective: Angel Riddle is an 65 y.o. year old female who is a primary patient of Einar Pheasant, MD.  The CCM team was consulted for assistance with disease management and care coordination needs.    Engaged with patient by telephone for initial visit in response to provider referral for pharmacy case management and/or care coordination services.   Consent to Services:  The patient was given the following information about Chronic Care Management services today, agreed to services, and gave verbal consent: 1. CCM service includes personalized support from designated clinical staff supervised by the primary care provider, including individualized plan of care and coordination with other care providers 2. 24/7 contact phone numbers for assistance for urgent and routine care needs. 3. Service will only be billed when office clinical staff spend 20 minutes or more in a month to coordinate care. 4. Only one practitioner may furnish and bill the service in a calendar month. 5.The patient may stop CCM services at any time (effective at the end of the month) by phone call to the office staff. 6. The patient will be responsible for cost sharing (co-pay) of up to 20% of the service fee (after annual deductible is met). Patient agreed to services and consent obtained.  Patient Care Team: Einar Pheasant, MD as PCP - General (Internal Medicine) Einar Pheasant, MD (Internal Medicine) Bary Castilla, Forest Gleason, MD (General Surgery) De Hollingshead, RPH-CPP (Pharmacist)  Recent office visits:  4/4 - PCP - f/u anxiety, psych referral, hx buspirone and BZD w/ no desire to restart, added gabapentin for back pain; discuss PCSK9i; neurology referral  Recent consult visits: None recently   Hospital visits: None in previous 6 months  Objective:  Lab Results  Component Value Date    CREATININE 0.82 03/05/2021   CREATININE 0.88 11/05/2020   CREATININE 0.82 07/02/2020    Lab Results  Component Value Date   HGBA1C 6.0 03/05/2021   Last diabetic Eye exam: No results found for: HMDIABEYEEXA  Last diabetic Foot exam: No results found for: HMDIABFOOTEX      Component Value Date/Time   CHOL 241 (H) 03/05/2021 1021   TRIG 205.0 (H) 03/05/2021 1021   HDL 50.30 03/05/2021 1021   CHOLHDL 5 03/05/2021 1021   VLDL 41.0 (H) 03/05/2021 1021   LDLCALC 134 (H) 11/05/2020 1028   LDLDIRECT 158.0 03/05/2021 1021    Hepatic Function Latest Ref Rng & Units 03/05/2021 11/05/2020 07/02/2020  Total Protein 6.0 - 8.3 g/dL 6.8 6.8 6.7  Albumin 3.5 - 5.2 g/dL 4.2 4.1 4.1  AST 0 - 37 U/L $Remo'19 16 16  'QBhfE$ ALT 0 - 35 U/L $Remo'10 6 9  'JaSxV$ Alk Phosphatase 39 - 117 U/L 105 84 102  Total Bilirubin 0.2 - 1.2 mg/dL 0.5 0.5 0.7  Bilirubin, Direct 0.0 - 0.3 mg/dL 0.1 0.1 0.1    Lab Results  Component Value Date/Time   TSH 1.64 03/05/2021 10:21 AM   TSH 1.18 03/31/2020 10:06 AM    CBC Latest Ref Rng & Units 03/05/2021 03/31/2020 02/07/2019  WBC 4.0 - 10.5 K/uL 11.0(H) 9.1 6.7  Hemoglobin 12.0 - 15.0 g/dL 13.6 14.0 13.4  Hematocrit 36.0 - 46.0 % 40.6 41.9 39.2  Platelets 150.0 - 400.0 K/uL 299.0 360.0 320.0    Lab Results  Component Value Date/Time   VD25OH 21.26 (L) 12/21/2019 03:43 PM    Clinical ASCVD: No  The 10-year  ASCVD risk score Mikey Bussing DC Jr., et al., 2013) is: 15.3%   Values used to calculate the score:     Age: 20 years     Sex: Female     Is Non-Hispanic African American: No     Diabetic: No     Tobacco smoker: Yes     Systolic Blood Pressure: 017 mmHg     Is BP treated: Yes     HDL Cholesterol: 50.3 mg/dL     Total Cholesterol: 241 mg/dL    Social History   Tobacco Use  Smoking Status Current Every Day Smoker  . Packs/day: 1.00  . Years: 37.00  . Pack years: 37.00  . Types: Cigarettes  . Start date: 08/07/2019  Smokeless Tobacco Never Used   BP Readings from Last 3  Encounters:  03/09/21 128/72  11/07/20 128/70  07/05/20 (!) 128/86   Pulse Readings from Last 3 Encounters:  03/09/21 75  11/07/20 94  07/04/20 90   Wt Readings from Last 3 Encounters:  03/09/21 170 lb (77.1 kg)  02/11/21 165 lb (74.8 kg)  11/07/20 169 lb (76.7 kg)    Assessment: Review of patient past medical history, allergies, medications, health status, including review of consultants reports, laboratory and other test data, was performed as part of comprehensive evaluation and provision of chronic care management services.   SDOH:  (Social Determinants of Health) assessments and interventions performed:  SDOH Interventions   Flowsheet Row Most Recent Value  SDOH Interventions   Financial Strain Interventions Intervention Not Indicated  Stress Interventions Other (Comment)  [provided empathetic listening. Offered LCSW referral. Patient declined at this time]      CCM Care Plan  Allergies  Allergen Reactions  . Levaquin [Levofloxacin In D5w] Other (See Comments)    Muscle pain  . Adhesive [Tape] Other (See Comments)    Pulls skin, anything that does not pull is recommended   . Doxycycline Nausea And Vomiting  . Ivp Dye [Iodinated Diagnostic Agents] Nausea And Vomiting and Other (See Comments)     Eyes turned blood red    Medications Reviewed Today    Reviewed by De Hollingshead, RPH-CPP (Pharmacist) on 04/10/21 at 1117  Med List Status: <None>  Medication Order Taking? Sig Documenting Provider Last Dose Status Informant  aspirin EC 81 MG tablet [id number] Yes Take 1 tablet (81 mg total) by mouth daily. Swallow whole. Einar Pheasant, MD Taking Active   ezetimibe (ZETIA) 10 MG tablet [id number] Yes Take 1 tablet (10 mg total) by mouth daily. Einar Pheasant, MD Taking Active   FLUoxetine (PROZAC) 40 MG capsule [id number] Yes Take 1 capsule (40 mg total) by mouth daily. Einar Pheasant, MD Taking Active   gabapentin (NEURONTIN) 300 MG capsule [id number] Yes Take 1  capsule (300 mg total) by mouth at bedtime. Einar Pheasant, MD Taking Active   hydrochlorothiazide (MICROZIDE) 12.5 MG capsule [id number] Yes Take 1 capsule (12.5 mg total) by mouth daily. Einar Pheasant, MD Taking Active   HYDROcodone-acetaminophen [facility]) 7.5-325 MG tablet [id number] Yes Take 1.5 tablets by mouth as needed. Using 1-2 times daily [provider] Taking Active   meloxicam (MOBIC) 15 MG tablet [id number] Yes Take 1 tablet (15 mg total) by mouth daily. Einar Pheasant, MD Taking Active   pantoprazole (PROTONIX) 40 MG tablet [id number] Yes Take 1 tablet (40 mg total) by mouth daily. Einar Pheasant, MD Taking Active   potassium chloride (KLOR-CON) 10 MEQ tablet [id number] Yes Take 1 tablet (10 mEq total) by mouth daily. Nicki Reaper,  Randell Patient, MD Taking Active   pravastatin (PRAVACHOL) 10 MG tablet [id number] Yes Take 1 tablet (10 mg total) by mouth daily. Einar Pheasant, MD Taking Active   tiZANidine (ZANAFLEX) 4 MG tablet [id number] Yes Take 2-4 mg by mouth 3 times daily as needed for muscle spasms [provider] Taking Active Self          Patient Active Problem List   Diagnosis Date Noted  . Double vision 03/14/2021  . Female bladder prolapse 03/14/2021  . Tinnitus 07/05/2020  . Chronic left sacroiliac pain 05/21/2020  . Chronic pain of left knee 05/21/2020  . Bilateral hip pain 05/21/2020  . Left elbow pain 05/21/2020  . Fatigue 12/30/2019  . Weight loss counseling, encounter for 12/30/2019  . Breast cancer screening 07/07/2019  . Aortic atherosclerosis ([place]) 02/10/2019  . GERD (gastroesophageal reflux disease) 06/20/2018  . Chest pain 01/06/2018  . Lumbar stenosis with neurogenic claudication 09/15/2017  . BMI 31.0-31.9,adult 04/25/2017  . Hyperglycemia 04/25/2017  . Personal history of tobacco use, presenting hazards to health 12/15/2016  . Nausea without vomiting 03/29/2016  . Personal history of colonic polyps   . Anal fissure   . Benign neoplasm of  sigmoid colon   . Left arm pain 08/03/2015  . Cough 07/30/2015  . Urinary hesitancy 07/30/2015  . Health care maintenance 01/12/2015  . Rectal irritation 11/24/2014  . Gallstones 04/01/2014  . Abdominal fullness 03/10/2014  . Numbness and tingling 10/02/2013  . Hypertension 10/02/2013  . Chronic back pain 07/18/2013  . Depression 07/18/2013  . Anxiety 07/18/2013  . Panic attacks 07/18/2013  . Environmental allergies 07/18/2013  . Hypercholesterolemia 07/18/2013  . History of colonic polyps 07/18/2013    Immunization History  Administered Date(s) Administered  . Influenza,inj,Quad PF,6+ Mos 09/05/2013, 08/05/2014, 08/20/2015, 08/23/2019  . Influenza-Unspecified 09/11/2016, 09/03/2017, 08/21/2018, 11/07/2020  . Tdap 06/15/2018    Conditions to be addressed/monitored: HTN, HLD, Anxiety and Depression  Care Plan : Medication Management  Updates made by De Hollingshead, RPH-CPP since 04/10/2021 12:00 AM    Problem: HLD, Depression, Anxiety, Chronic Pain     Long-Range Goal: Disease Progression Prevention   Start Date: 04/10/2021  This Visit's Progress: On track  Priority: High  Note:   Current Barriers:  . Unable to achieve control of cholesterol   Pharmacist Clinical Goal(s):  Marland Kitchen Over the next 90 days, patient will achieve control of cholesterol through collaboration with PharmD and provider.   Interventions: . 1:1 collaboration with Einar Pheasant, MD regarding development and update of comprehensive plan of care as evidenced by provider attestation and co-signature . Inter-disciplinary care team collaboration (see longitudinal plan of care) . Comprehensive medication review performed; medication list updated in electronic medical record  Hyperlipidemia, ASCVD risk reduction: . Uncontrolled; current treatment: pravastatin 10 mg daily, ezetimibe 10 mg daily  . Medications previously tried: pravastatin 20 mg daily - myalgias; atorvastatin, rosuvastatin - myalgias, she  does not remember doses and pharmacy records do not go back that far.  . Antiplatelet regimen: aspirin 81 mg daily  . Reports a family history of high cholesterol and ASCVD . Discussed PCSK9i, including administration, mechanism of action, side effects, monitoring. Patient amenable. PA started for Repatha, appears Praluent is preferred. Submitted PA for Praluent 75 mg injection. Will follow for decision. Discussed [place] availability for copay support. Patient meets income requirement . Recommend lipid/LFT recheck in ~ 6-12 weeks s/p Praluent initiation. If LDL not at goal, recommend increase in Praluent dose. . Will discuss tobacco use  moving forward.   Hypertension: . Controlled; current treatment: HCTZ 12.5 mg daily, potassium 10 mEq daily  . Recommend to continue current regimen at this time.   Depression/Anxiety, Chronic Pain . Uncontrolled; current treatment: fluoxetine 40 mg daily;  o Hx buspirone with no benefit. Discussion last visit w/ Dr. Nicki Reaper about avoiding BZD. Hx duloxetine  . Chronic pain: current regimen: hydrocodone/acetaminophen 7.5/325 mg 1.5 tablet PRN, 0-2 times daily, meloxicam 15 mg daily, gabapentin 300 mg QPM, tizanidine 4 mg PRN, Reports no benefit but no sedation from gabapentin. Plans to follow up with Dr. Arnoldo Morale next month. . Stress caring for her husband. Personality changes s/p CVA. Lung cancer. Notes that there seem to be lots of arguments.  . Asks if she could be prescribed triazolam, as she slept well after receiving this for dental procedures.  . PCP placed psych referral. Patient concerned about adding extra stress right now of having to meet with an extra person. Offered referral to CCM LCSW if patient changes her mind. . Extensive education provided with risks of benzodiazepine. Avoid triazolam. Discussed that gabapentin dose can be increased moving forward. Encouraged to discuss increasing gabapentin dose with Dr. Nicki Reaper or Dr. Arnoldo Morale moving  forward.   Patient Goals/Self-Care Activities . Over the next 90 days, patient will:  - take medications as prescribed collaborate with provider on medication access solutions  Follow Up Plan: Telephone follow up appointment with care management team member scheduled for: pending medication access plan     Medication Assistance: None required.  Patient affirms current coverage meets needs.  Patient's preferred pharmacy is:  [place], [place] - [address] [place] [address] Phone: [phone] Fax: [phone]   Follow Up:  Patient agrees to Care Plan and Follow-up.  Plan: Telephone follow up appointment with care management team member scheduled for:  pending medication access plan  Catie Darnelle Maffucci, PharmD, [place], [place] Clinical Pharmacist [employer] at [facility] [phone]

## 2021-04-10 NOTE — Patient Instructions (Addendum)
Angel Riddle,   It was great talking to you today!  I will let you know when I hear something from your insurance company regarding the cholesterol injection.  Talk to Dr. Hulda Humphrey about increasing the gabapentin dose.    Let me know if you change your mind and want to chat with our in office licensed clinical social worker (which means she is trained in mental health support). 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   For insurance help, I recommend you work with the [employer] ([place]). This is their website (https://www.strong.com/). Their local reps are at:   [address] [place]  [phone]      Take care,   Catie Darnelle Maffucci, PharmD, [place], [place]  Visit Information   PATIENT GOALS:  Goals Addressed            This Visit's Progress   . Medication Monitoring       Patient Goals/Self-Care Activities . Over the next 90 days, patient will:  - take medications as prescribed collaborate with provider on medication access solutions        Consent to CCM Services: Angel Riddle was given information about Chronic Care Management services today including:  1. CCM service includes personalized support from designated clinical staff supervised by her physician, including individualized plan of care and coordination with other care providers 2. 24/7 contact phone numbers for assistance for urgent and routine care needs. 3. Service will only be billed when office clinical staff spend 20 minutes or more in a month to coordinate care. 4. Only one practitioner may furnish and bill the service in a calendar month. 5. The patient may stop CCM services at any time (effective at the end of the month) by phone call to the office staff. 6. The patient will be responsible for cost sharing (co-pay) of up to 20% of the service  fee (after annual deductible is met).  Patient agreed to services and verbal consent obtained.   The patient verbalized understanding of instructions, educational materials, and care plan provided today and agreed to receive a mailed copy of patient instructions, educational materials, and care plan.   Plan: Telephone follow up appointment with care management team member scheduled for:  pending medication access plan  Catie Darnelle Maffucci, PharmD, [place], CPP Clinical Pharmacist [place] at [facility]: Patient Care Plan: Medication Management    Problem Identified: HLD, Depression, Anxiety, Chronic Pain     Long-Range Goal: Disease Progression Prevention   Start Date: 04/10/2021  This Visit's Progress: On track  Priority: High  Note:   Current Barriers:  . Unable to achieve control of cholesterol   Pharmacist Clinical Goal(s):  Marland Kitchen Over the next 90 days, patient will achieve control of cholesterol through collaboration with PharmD and provider.   Interventions: . 1:1 collaboration with Einar Pheasant, MD regarding development and update of comprehensive plan of care as evidenced by provider attestation and co-signature . Inter-disciplinary care team collaboration (see longitudinal plan of care) . Comprehensive medication review performed; medication list updated in electronic medical record  Hyperlipidemia, ASCVD risk reduction: . Uncontrolled; current treatment: pravastatin 10 mg daily, ezetimibe 10 mg daily  . Medications previously tried: pravastatin 20 mg daily - myalgias; atorvastatin, rosuvastatin - myalgias, she does not remember doses and pharmacy records do not go back that far.  . Antiplatelet regimen: aspirin 81 mg daily  .  Reports a family history of high cholesterol and ASCVD . Discussed PCSK9i, including administration, mechanism of action, side effects, monitoring. Patient amenable. PA started for Repatha, appears Praluent  is preferred. Submitted PA for Praluent 75 mg injection. Will follow for decision. Discussed [place] availability for copay support. Patient meets income requirement . Recommend lipid/LFT recheck in ~ 6-12 weeks s/p Praluent initiation. If LDL not at goal, recommend increase in Praluent dose. . Will discuss tobacco use moving forward.   Hypertension: . Controlled; current treatment: HCTZ 12.5 mg daily, potassium 10 mEq daily  . Recommend to continue current regimen at this time.   Depression/Anxiety, Chronic Pain . Uncontrolled; current treatment: fluoxetine 40 mg daily;  o Hx buspirone with no benefit. Discussion last visit w/ Dr. Nicki Reaper about avoiding BZD. Hx duloxetine  . Chronic pain: current regimen: hydrocodone/acetaminophen 7.5/325 mg 1.5 tablet PRN, 0-2 times daily, meloxicam 15 mg daily, gabapentin 300 mg QPM, tizanidine 4 mg PRN, Reports no benefit but no sedation from gabapentin. Plans to follow up with Dr. Arnoldo Morale next month. . Stress caring for her husband. Personality changes s/p CVA. Lung cancer. Notes that there seem to be lots of arguments.  . Asks if she could be prescribed triazolam, as she slept well after receiving this for dental procedures.  . PCP placed psych referral. Patient concerned about adding extra stress right now of having to meet with an extra person. Offered referral to CCM LCSW if patient changes her mind. . Extensive education provided with risks of benzodiazepine. Avoid triazolam. Discussed that gabapentin dose can be increased moving forward. Encouraged to discuss increasing gabapentin dose with Dr. Nicki Reaper or Dr. Arnoldo Morale moving forward.   Patient Goals/Self-Care Activities . Over the next 90 days, patient will:  - take medications as prescribed collaborate with provider on medication access solutions  Follow Up Plan: Telephone follow up appointment with care management team member scheduled for: pending medication access plan

## 2021-04-13 ENCOUNTER — Ambulatory Visit: Payer: Medicare Other | Admitting: Pharmacist

## 2021-04-13 DIAGNOSIS — M791 Myalgia, unspecified site: Secondary | ICD-10-CM

## 2021-04-13 DIAGNOSIS — E7800 Pure hypercholesterolemia, unspecified: Secondary | ICD-10-CM

## 2021-04-13 DIAGNOSIS — F32A Depression, unspecified: Secondary | ICD-10-CM

## 2021-04-13 DIAGNOSIS — G8929 Other chronic pain: Secondary | ICD-10-CM

## 2021-04-13 DIAGNOSIS — H532 Diplopia: Secondary | ICD-10-CM

## 2021-04-13 DIAGNOSIS — I700 Atherosclerosis of aorta: Secondary | ICD-10-CM

## 2021-04-13 DIAGNOSIS — T466X5A Adverse effect of antihyperlipidemic and antiarteriosclerotic drugs, initial encounter: Secondary | ICD-10-CM

## 2021-04-13 DIAGNOSIS — M549 Dorsalgia, unspecified: Secondary | ICD-10-CM

## 2021-04-13 MED ORDER — PRALUENT 75 MG/ML ~~LOC~~ SOAJ: 75.0000 mg | SUBCUTANEOUS | 2 refills | Status: DC

## 2021-04-13 NOTE — Chronic Care Management (AMB) (Signed)
Chronic Care Management Pharmacy Note  04/13/2021 Name:  Angel Riddle MRN:  [id number] DOB:  August 13, 1956  Subjective: Angel Riddle is an 65 y.o. year old female who is a primary patient of Einar Pheasant, MD.  The CCM team was consulted for assistance with disease management and care coordination needs.    Care coordination for medication access  in response to provider referral for pharmacy case management and/or care coordination services.   Consent to Services:  The patient was given information about Chronic Care Management services, agreed to services, and gave verbal consent prior to initiation of services.  Please see initial visit note for detailed documentation.   Patient Care Team: Einar Pheasant, MD as PCP - General (Internal Medicine) Einar Pheasant, MD (Internal Medicine) Bary Castilla Forest Gleason, MD (General Surgery) De Hollingshead, RPH-CPP (Pharmacist)  Recent office visits: None since our last call  Recent consult visits: None since our last call  Hospital visits: None in previous 6 months  Objective:  Lab Results  Component Value Date   CREATININE 0.82 03/05/2021   CREATININE 0.88 11/05/2020   CREATININE 0.82 07/02/2020    Lab Results  Component Value Date   HGBA1C 6.0 03/05/2021   Last diabetic Eye exam: No results found for: HMDIABEYEEXA  Last diabetic Foot exam: No results found for: HMDIABFOOTEX      Component Value Date/Time   CHOL 241 (H) 03/05/2021 1021   TRIG 205.0 (H) 03/05/2021 1021   HDL 50.30 03/05/2021 1021   CHOLHDL 5 03/05/2021 1021   VLDL 41.0 (H) 03/05/2021 1021   LDLCALC 134 (H) 11/05/2020 1028   LDLDIRECT 158.0 03/05/2021 1021    Hepatic Function Latest Ref Rng & Units 03/05/2021 11/05/2020 07/02/2020  Total Protein 6.0 - 8.3 g/dL 6.8 6.8 6.7  Albumin 3.5 - 5.2 g/dL 4.2 4.1 4.1  AST 0 - 37 U/L '19 16 16  ' ALT 0 - 35 U/L '10 6 9  ' Alk Phosphatase 39 - 117 U/L 105 84 102  Total Bilirubin 0.2 - 1.2 mg/dL 0.5 0.5 0.7   Bilirubin, Direct 0.0 - 0.3 mg/dL 0.1 0.1 0.1    Lab Results  Component Value Date/Time   TSH 1.64 03/05/2021 10:21 AM   TSH 1.18 03/31/2020 10:06 AM    CBC Latest Ref Rng & Units 03/05/2021 03/31/2020 02/07/2019  WBC 4.0 - 10.5 K/uL 11.0(H) 9.1 6.7  Hemoglobin 12.0 - 15.0 g/dL 13.6 14.0 13.4  Hematocrit 36.0 - 46.0 % 40.6 41.9 39.2  Platelets 150.0 - 400.0 K/uL 299.0 360.0 320.0    Lab Results  Component Value Date/Time   VD25OH 21.26 (L) 12/21/2019 03:43 PM    Clinical ASCVD: No  The 10-year ASCVD risk score Mikey Bussing DC Jr., et al., 2013) is: 15.3%   Values used to calculate the score:     Age: 65 years     Sex: Female     Is Non-Hispanic African American: No     Diabetic: No     Tobacco smoker: Yes     Systolic Blood Pressure: 035 mmHg     Is BP treated: Yes     HDL Cholesterol: 50.3 mg/dL     Total Cholesterol: 241 mg/dL     Social History   Tobacco Use  Smoking Status Current Every Day Smoker  . Packs/day: 1.00  . Years: 37.00  . Pack years: 37.00  . Types: Cigarettes  . Start date: 08/07/2019  Smokeless Tobacco Never Used   BP Readings from Last 3  Encounters:  03/09/21 128/72  11/07/20 128/70  07/05/20 (!) 128/86   Pulse Readings from Last 3 Encounters:  03/09/21 75  11/07/20 94  07/04/20 90   Wt Readings from Last 3 Encounters:  03/09/21 170 lb (77.1 kg)  02/11/21 165 lb (74.8 kg)  11/07/20 169 lb (76.7 kg)    Assessment: Review of patient past medical history, allergies, medications, health status, including review of consultants reports, laboratory and other test data, was performed as part of comprehensive evaluation and provision of chronic care management services.   SDOH:  (Social Determinants of Health) assessments and interventions performed:  SDOH Interventions   Flowsheet Row Most Recent Value  SDOH Interventions   Financial Strain Interventions Intervention Not Indicated      CCM Care Plan  Allergies  Allergen Reactions  .  Levaquin [Levofloxacin In D5w] Other (See Comments)    Muscle pain  . Adhesive [Tape] Other (See Comments)    Pulls skin, anything that does not pull is recommended   . Doxycycline Nausea And Vomiting  . Ivp Dye [Iodinated Diagnostic Agents] Nausea And Vomiting and Other (See Comments)     Eyes turned blood red    Medications Reviewed Today    Reviewed by De Hollingshead, RPH-CPP (Pharmacist) on 04/10/21 at 1117  Med List Status: <None>  Medication Order Taking? Sig Documenting Provider Last Dose Status Informant  aspirin EC 81 MG tablet [id number] Yes Take 1 tablet (81 mg total) by mouth daily. Swallow whole. Einar Pheasant, MD Taking Active   ezetimibe (ZETIA) 10 MG tablet [id number] Yes Take 1 tablet (10 mg total) by mouth daily. Einar Pheasant, MD Taking Active   FLUoxetine (PROZAC) 40 MG capsule [id number] Yes Take 1 capsule (40 mg total) by mouth daily. Einar Pheasant, MD Taking Active   gabapentin (NEURONTIN) 300 MG capsule [id number] Yes Take 1 capsule (300 mg total) by mouth at bedtime. Einar Pheasant, MD Taking Active   hydrochlorothiazide (MICROZIDE) 12.5 MG capsule [id number] Yes Take 1 capsule (12.5 mg total) by mouth daily. Einar Pheasant, MD Taking Active   HYDROcodone-acetaminophen [facility]) 7.5-325 MG tablet [id number] Yes Take 1.5 tablets by mouth as needed. Using 1-2 times daily [provider] Taking Active   meloxicam (MOBIC) 15 MG tablet [id number] Yes Take 1 tablet (15 mg total) by mouth daily. Einar Pheasant, MD Taking Active   pantoprazole (PROTONIX) 40 MG tablet [id number] Yes Take 1 tablet (40 mg total) by mouth daily. Einar Pheasant, MD Taking Active   potassium chloride (KLOR-CON) 10 MEQ tablet [id number] Yes Take 1 tablet (10 mEq total) by mouth daily. Einar Pheasant, MD Taking Active   pravastatin (PRAVACHOL) 10 MG tablet [id number] Yes Take 1 tablet (10 mg total) by mouth daily. Einar Pheasant, MD Taking Active   tiZANidine (ZANAFLEX) 4 MG tablet  [id number] Yes Take 2-4 mg by mouth 3 times daily as needed for muscle spasms [provider] Taking Active Self          Patient Active Problem List   Diagnosis Date Noted  . Double vision 03/14/2021  . Female bladder prolapse 03/14/2021  . Tinnitus 07/05/2020  . Chronic left sacroiliac pain 05/21/2020  . Chronic pain of left knee 05/21/2020  . Bilateral hip pain 05/21/2020  . Left elbow pain 05/21/2020  . Fatigue 12/30/2019  . Weight loss counseling, encounter for 12/30/2019  . Breast cancer screening 07/07/2019  . Aortic atherosclerosis ([place]) 02/10/2019  . GERD (gastroesophageal reflux disease) 06/20/2018  . Chest pain 01/06/2018  . Lumbar  stenosis with neurogenic claudication 09/15/2017  . BMI 31.0-31.9,adult 04/25/2017  . Hyperglycemia 04/25/2017  . Personal history of tobacco use, presenting hazards to health 12/15/2016  . Nausea without vomiting 03/29/2016  . Personal history of colonic polyps   . Anal fissure   . Benign neoplasm of sigmoid colon   . Left arm pain 08/03/2015  . Cough 07/30/2015  . Urinary hesitancy 07/30/2015  . Health care maintenance 01/12/2015  . Rectal irritation 11/24/2014  . Gallstones 04/01/2014  . Abdominal fullness 03/10/2014  . Numbness and tingling 10/02/2013  . Hypertension 10/02/2013  . Chronic back pain 07/18/2013  . Depression 07/18/2013  . Anxiety 07/18/2013  . Panic attacks 07/18/2013  . Environmental allergies 07/18/2013  . Hypercholesterolemia 07/18/2013  . History of colonic polyps 07/18/2013    Immunization History  Administered Date(s) Administered  . Influenza,inj,Quad PF,6+ Mos 09/05/2013, 08/05/2014, 08/20/2015, 08/23/2019  . Influenza-Unspecified 09/11/2016, 09/03/2017, 08/21/2018, 11/07/2020  . Tdap 06/15/2018    Conditions to be addressed/monitored: HLD, Anxiety, Depression and chronic pain  Care Plan : Medication Management  Updates made by De Hollingshead, RPH-CPP since 04/13/2021 12:00 AM     Problem: HLD, Depression, Anxiety, Chronic Pain     Long-Range Goal: Disease Progression Prevention   Start Date: 04/10/2021  This Visit's Progress: On track  Recent Progress: On track  Priority: High  Note:   Current Barriers:  . Unable to achieve control of cholesterol   Pharmacist Clinical Goal(s):  Marland Kitchen Over the next 90 days, patient will achieve control of cholesterol through collaboration with PharmD and provider.   Interventions: . 1:1 collaboration with Einar Pheasant, MD regarding development and update of comprehensive plan of care as evidenced by provider attestation and co-signature . Inter-disciplinary care team collaboration (see longitudinal plan of care) . Comprehensive medication review performed; medication list updated in electronic medical record  Hyperlipidemia, ASCVD risk reduction: . Uncontrolled; current treatment: pravastatin 10 mg daily, ezetimibe 10 mg daily  . Medications previously tried: pravastatin 20 mg daily - myalgias; atorvastatin, rosuvastatin - myalgias, she does not remember doses and pharmacy records do not go back that far.  . Antiplatelet regimen: aspirin 81 mg daily  . PA for Praluent was approved. [place] to ask them to run the prescription. Appears they have something wrong on file with patient's insurance, as what they are showing is showing as inactive. If her drug plan was inactive, PA would not have gone through. Called patient, encouraged her to take her insurance card to [employer]. She will call me once this is fixed and we will discuss copay and if [place] for copay funding assistance is needed.   Hypertension: . Controlled; current treatment: HCTZ 12.5 mg daily, potassium 10 mEq daily  . Previously recommended to continue current regimen at this time.   Depression/Anxiety, Chronic Pain . Uncontrolled; current treatment: fluoxetine 40 mg daily;  o Hx buspirone with no benefit. Discussion last visit w/ Dr.  Nicki Reaper about avoiding BZD. Hx duloxetine  . Chronic pain: current regimen: hydrocodone/acetaminophen 7.5/325 mg 1.5 tablet PRN, 0-2 times daily, meloxicam 15 mg daily, gabapentin 300 mg QPM, tizanidine 4 mg PRN, Reports no benefit but no sedation from gabapentin. Plans to follow up with Dr. Arnoldo Morale next month. . Stress caring for her husband. Personality changes s/p CVA. Lung cancer. Notes that there seem to be lots of arguments.  . Previously discussed that gabapentin dose can be increased moving forward. Encouraged to discuss increasing gabapentin dose with Dr. Nicki Reaper or Dr.  Arnoldo Morale moving forward.   Patient Goals/Self-Care Activities . Over the next 90 days, patient will:  - take medications as prescribed collaborate with provider on medication access solutions  Follow Up Plan: Telephone follow up appointment with care management team member scheduled for: pending medication access plan     Medication Assistance: will determine once pharmacy has correct insurance on file  Patient's preferred pharmacy is:  [facility], [place] [address] Phone: [phone] Fax: [phone]   Follow Up:  Patient agrees to Care Plan and Follow-up.  Plan: Telephone follow up appointment with care management team member scheduled for:  pending insurance update  Catie Darnelle Maffucci, PharmD, [place], [place] Clinical Pharmacist [employer] [facility] [employer] [phone]

## 2021-04-13 NOTE — Patient Instructions (Signed)
Visit Information  PATIENT GOALS: Goals Addressed            This Visit's Progress   . Medication Monitoring       Patient Goals/Self-Care Activities . Over the next 90 days, patient will:  - take medications as prescribed collaborate with provider on medication access solutions       Patient verbalizes understanding of instructions provided today and agrees to view in [place].    Plan: Telephone follow up appointment with care management team member scheduled for:  pending insurance update  Catie Darnelle Maffucci, PharmD, [place], [place] Clinical Pharmacist [employer] at [employer] [phone]

## 2021-04-15 ENCOUNTER — Ambulatory Visit: Payer: Medicare Other | Admitting: Pharmacist

## 2021-04-15 DIAGNOSIS — M791 Myalgia, unspecified site: Secondary | ICD-10-CM

## 2021-04-15 DIAGNOSIS — G8929 Other chronic pain: Secondary | ICD-10-CM

## 2021-04-15 DIAGNOSIS — E7800 Pure hypercholesterolemia, unspecified: Secondary | ICD-10-CM

## 2021-04-15 DIAGNOSIS — F32A Depression, unspecified: Secondary | ICD-10-CM

## 2021-04-15 NOTE — Patient Instructions (Signed)
Visit Information  PATIENT GOALS: Goals Addressed            This Visit's Progress   . Medication Monitoring       Patient Goals/Self-Care Activities . Over the next 90 days, patient will:  - take medications as prescribed collaborate with provider on medication access solutions        Patient verbalizes understanding of instructions provided today and agrees to view in [place].    Plan: Telephone follow up appointment with care management team member scheduled for:  ~ 2 days  Catie Darnelle Maffucci, PharmD, [place], [place] Clinical Pharmacist [employer] at [employer] [phone]

## 2021-04-15 NOTE — Chronic Care Management (AMB) (Signed)
Chronic Care Management Pharmacy Note  04/15/2021 Name:  Angel Riddle MRN:  [id number] DOB:  03-27-1956  Subjective: Angel Riddle is an 65 y.o. year old female who is a primary patient of Einar Pheasant, MD.  The CCM team was consulted for assistance with disease management and care coordination needs.    Care coordination for follow up visit in response to provider referral for pharmacy case management and/or care coordination services.   Consent to Services:  The patient was given information about Chronic Care Management services, agreed to services, and gave verbal consent prior to initiation of services.  Please see initial visit note for detailed documentation.   Patient Care Team: Einar Pheasant, MD as PCP - General (Internal Medicine) Einar Pheasant, MD (Internal Medicine) Bary Castilla [place] Gleason, MD (General Surgery) De Hollingshead, RPH-CPP (Pharmacist)  Recent office visits: None since our last call  Recent consult visits: None since our last call  Hospital visits: None in previous 6 months  Objective:  Lab Results  Component Value Date   CREATININE 0.82 03/05/2021   CREATININE 0.88 11/05/2020   CREATININE 0.82 07/02/2020    Lab Results  Component Value Date   HGBA1C 6.0 03/05/2021   Last diabetic Eye exam: No results found for: HMDIABEYEEXA  Last diabetic Foot exam: No results found for: HMDIABFOOTEX      Component Value Date/Time   CHOL 241 (H) 03/05/2021 1021   TRIG 205.0 (H) 03/05/2021 1021   HDL 50.30 03/05/2021 1021   CHOLHDL 5 03/05/2021 1021   VLDL 41.0 (H) 03/05/2021 1021   LDLCALC 134 (H) 11/05/2020 1028   LDLDIRECT 158.0 03/05/2021 1021    Hepatic Function Latest Ref Rng & Units 03/05/2021 11/05/2020 07/02/2020  Total Protein 6.0 - 8.3 g/dL 6.8 6.8 6.7  Albumin 3.5 - 5.2 g/dL 4.2 4.1 4.1  AST 0 - 37 U/L '19 16 16  ' ALT 0 - 35 U/L '10 6 9  ' Alk Phosphatase 39 - 117 U/L 105 84 102  Total Bilirubin 0.2 - 1.2 mg/dL 0.5 0.5 0.7   Bilirubin, Direct 0.0 - 0.3 mg/dL 0.1 0.1 0.1    Lab Results  Component Value Date/Time   TSH 1.64 03/05/2021 10:21 AM   TSH 1.18 03/31/2020 10:06 AM    CBC Latest Ref Rng & Units 03/05/2021 03/31/2020 02/07/2019  WBC 4.0 - 10.5 K/uL 11.0(H) 9.1 6.7  Hemoglobin 12.0 - 15.0 g/dL 13.6 14.0 13.4  Hematocrit 36.0 - 46.0 % 40.6 41.9 39.2  Platelets 150.0 - 400.0 K/uL 299.0 360.0 320.0    Lab Results  Component Value Date/Time   VD25OH 21.26 (L) 12/21/2019 03:43 PM    Clinical ASCVD: No  The 10-year ASCVD risk score Mikey Bussing DC Jr., et al., 2013) is: 15.3%   Values used to calculate the score:     Age: 89 years     Sex: Female     Is Non-Hispanic African American: No     Diabetic: No     Tobacco smoker: Yes     Systolic Blood Pressure: 882 mmHg     Is BP treated: Yes     HDL Cholesterol: 50.3 mg/dL     Total Cholesterol: 241 mg/dL     Social History   Tobacco Use  Smoking Status Current Every Day Smoker  . Packs/day: 1.00  . Years: 37.00  . Pack years: 37.00  . Types: Cigarettes  . Start date: 08/07/2019  Smokeless Tobacco Never Used   BP Readings from Last 3  Encounters:  03/09/21 128/72  11/07/20 128/70  07/05/20 (!) 128/86   Pulse Readings from Last 3 Encounters:  03/09/21 75  11/07/20 94  07/04/20 90   Wt Readings from Last 3 Encounters:  03/09/21 170 lb (77.1 kg)  02/11/21 165 lb (74.8 kg)  11/07/20 169 lb (76.7 kg)    Assessment: Review of patient past medical history, allergies, medications, health status, including review of consultants reports, laboratory and other test data, was performed as part of comprehensive evaluation and provision of chronic care management services.   SDOH:  (Social Determinants of Health) assessments and interventions performed:    CCM Care Plan  Allergies  Allergen Reactions  . Levaquin [Levofloxacin In D5w] Other (See Comments)    Muscle pain  . Adhesive [Tape] Other (See Comments)    Pulls skin, anything that does not  pull is recommended   . Doxycycline Nausea And Vomiting  . Ivp Dye [Iodinated Diagnostic Agents] Nausea And Vomiting and Other (See Comments)     Eyes turned blood red    Medications Reviewed Today    Reviewed by De Hollingshead, RPH-CPP (Pharmacist) on 04/10/21 at 1117  Med List Status: <None>  Medication Order Taking? Sig Documenting Provider Last Dose Status Informant  aspirin EC 81 MG tablet [id number] Yes Take 1 tablet (81 mg total) by mouth daily. Swallow whole. Einar Pheasant, MD Taking Active   ezetimibe (ZETIA) 10 MG tablet [id number] Yes Take 1 tablet (10 mg total) by mouth daily. Einar Pheasant, MD Taking Active   FLUoxetine (PROZAC) 40 MG capsule [id number] Yes Take 1 capsule (40 mg total) by mouth daily. Einar Pheasant, MD Taking Active   gabapentin (NEURONTIN) 300 MG capsule [id number] Yes Take 1 capsule (300 mg total) by mouth at bedtime. Einar Pheasant, MD Taking Active   hydrochlorothiazide (MICROZIDE) 12.5 MG capsule [id number] Yes Take 1 capsule (12.5 mg total) by mouth daily. Einar Pheasant, MD Taking Active   HYDROcodone-acetaminophen [facility]) 7.5-325 MG tablet [id number] Yes Take 1.5 tablets by mouth as needed. Using 1-2 times daily [provider] Taking Active   meloxicam (MOBIC) 15 MG tablet [id number] Yes Take 1 tablet (15 mg total) by mouth daily. Einar Pheasant, MD Taking Active   pantoprazole (PROTONIX) 40 MG tablet [id number] Yes Take 1 tablet (40 mg total) by mouth daily. Einar Pheasant, MD Taking Active   potassium chloride (KLOR-CON) 10 MEQ tablet [id number] Yes Take 1 tablet (10 mEq total) by mouth daily. Einar Pheasant, MD Taking Active   pravastatin (PRAVACHOL) 10 MG tablet [id number] Yes Take 1 tablet (10 mg total) by mouth daily. Einar Pheasant, MD Taking Active   tiZANidine (ZANAFLEX) 4 MG tablet [id number] Yes Take 2-4 mg by mouth 3 times daily as needed for muscle spasms [provider] Taking Active Self          Patient Active  Problem List   Diagnosis Date Noted  . Double vision 03/14/2021  . Female bladder prolapse 03/14/2021  . Tinnitus 07/05/2020  . Chronic left sacroiliac pain 05/21/2020  . Chronic pain of left knee 05/21/2020  . Bilateral hip pain 05/21/2020  . Left elbow pain 05/21/2020  . Fatigue 12/30/2019  . Weight loss counseling, encounter for 12/30/2019  . Breast cancer screening 07/07/2019  . Aortic atherosclerosis ([place]) 02/10/2019  . GERD (gastroesophageal reflux disease) 06/20/2018  . Chest pain 01/06/2018  . Lumbar stenosis with neurogenic claudication 09/15/2017  . BMI 31.0-31.9,adult 04/25/2017  . Hyperglycemia 04/25/2017  . Personal history of tobacco use, presenting hazards  to health 12/15/2016  . Nausea without vomiting 03/29/2016  . Personal history of colonic polyps   . Anal fissure   . Benign neoplasm of sigmoid colon   . Left arm pain 08/03/2015  . Cough 07/30/2015  . Urinary hesitancy 07/30/2015  . Health care maintenance 01/12/2015  . Rectal irritation 11/24/2014  . Gallstones 04/01/2014  . Abdominal fullness 03/10/2014  . Numbness and tingling 10/02/2013  . Hypertension 10/02/2013  . Chronic back pain 07/18/2013  . Depression 07/18/2013  . Anxiety 07/18/2013  . Panic attacks 07/18/2013  . Environmental allergies 07/18/2013  . Hypercholesterolemia 07/18/2013  . History of colonic polyps 07/18/2013    Immunization History  Administered Date(s) Administered  . Influenza,inj,Quad PF,6+ Mos 09/05/2013, 08/05/2014, 08/20/2015, 08/23/2019  . Influenza-Unspecified 09/11/2016, 09/03/2017, 08/21/2018, 11/07/2020  . Tdap 06/15/2018    Conditions to be addressed/monitored: HLD, Depression and chronic pain  Care Plan : Medication Management  Updates made by De Hollingshead, RPH-CPP since 04/15/2021 12:00 AM    Problem: HLD, Depression, Anxiety, Chronic Pain     Long-Range Goal: Disease Progression Prevention   Start Date: 04/10/2021  This Visit's Progress: On track   Recent Progress: On track  Priority: High  Note:   Current Barriers:  . Unable to achieve control of cholesterol   Pharmacist Clinical Goal(s):  Marland Kitchen Over the next 90 days, patient will achieve control of cholesterol through collaboration with PharmD and provider.   Interventions: . 1:1 collaboration with Einar Pheasant, MD regarding development and update of comprehensive plan of care as evidenced by provider attestation and co-signature . Inter-disciplinary care team collaboration (see longitudinal plan of care) . Comprehensive medication review performed; medication list updated in electronic medical record  Health Maintenance: . Reports that she has respiratory symptoms, self-tested for COVID and was negative.   Hyperlipidemia, ASCVD risk reduction: . Uncontrolled; current treatment: pravastatin 10 mg daily, ezetimibe 10 mg daily  . Medications previously tried: pravastatin 20 mg daily - myalgias; atorvastatin, rosuvastatin - myalgias, she does not remember doses and pharmacy records do not go back that far.  . Antiplatelet regimen: aspirin 81 mg daily  . PA for Praluent was approved. Contacted [employer]. They now have per insurance information on file and with her deductible, copay is >$400. Contacted patient to discuss and discuss [employer]. Patient requests that we do a three way call on Friday afternoon. Will call her back at that time.   Hypertension: . Controlled; current treatment: HCTZ 12.5 mg daily, potassium 10 mEq daily  . Previously recommended to continue current regimen at this time.   Depression/Anxiety, Chronic Pain . Uncontrolled; current treatment: fluoxetine 40 mg daily;  o Hx buspirone with no benefit. Discussion last visit w/ Dr. Nicki Reaper about avoiding BZD. Hx duloxetine  . Chronic pain: current regimen: hydrocodone/acetaminophen 7.5/325 mg 1.5 tablet PRN, 0-2 times daily, meloxicam 15 mg daily, gabapentin 300 mg QPM, tizanidine 4 mg PRN, Reports no  benefit but no sedation from gabapentin. Plans to follow up with Dr. Arnoldo Morale next month. . Stress caring for her husband. Personality changes s/p CVA. Lung cancer. Notes that there seem to be lots of arguments.  . Previously discussed that gabapentin dose can be increased moving forward. Encouraged to discuss increasing gabapentin dose with Dr. Nicki Reaper or Dr. Arnoldo Morale moving forward.   Patient Goals/Self-Care Activities . Over the next 90 days, patient will:  - take medications as prescribed collaborate with provider on medication access solutions  Follow Up Plan: Telephone follow up appointment with  care management team member scheduled for: 2 days     Medication Assistance: Will discuss [place] funding at next visit  Patient's preferred pharmacy is:  [place], [place] [address] [place] [address] Phone: [phone] Fax: [phone]   Follow Up:  Patient agrees to Care Plan and Follow-up.  Plan: Telephone follow up appointment with care management team member scheduled for:  ~ 2 days  Catie Darnelle Maffucci, PharmD, [place], [place] Clinical Pharmacist [employer] at [employer] [phone]

## 2021-04-17 ENCOUNTER — Encounter: Payer: Self-pay | Admitting: Internal Medicine

## 2021-04-17 ENCOUNTER — Telehealth: Payer: Self-pay | Admitting: Pharmacist

## 2021-04-17 ENCOUNTER — Telehealth: Payer: Medicare Other

## 2021-04-17 NOTE — Telephone Encounter (Signed)
Called patient for CCM visit, but she reports that she still has a painful cough. Requesting something for cough and an antibiotic, if Dr. Nicki Reaper thinks she needs one. Advised she would likely need an appointment, but that I would route to Dr. Bary Leriche CMA.

## 2021-04-20 DIAGNOSIS — J209 Acute bronchitis, unspecified: Secondary | ICD-10-CM | POA: Diagnosis not present

## 2021-04-20 DIAGNOSIS — Z03818 Encounter for observation for suspected exposure to other biological agents ruled out: Secondary | ICD-10-CM | POA: Diagnosis not present

## 2021-04-20 NOTE — Telephone Encounter (Signed)
Per chart review, patient sent MyChart over the weekend that she was feeling better. Closing phone note.

## 2021-04-20 NOTE — Telephone Encounter (Signed)
Called patient to schedule appt. Unable to leave message on VM

## 2021-04-20 NOTE — Telephone Encounter (Signed)
See my chart message

## 2021-04-20 NOTE — Telephone Encounter (Signed)
Pt evaluated at urgent care at 12:30 today

## 2021-04-30 ENCOUNTER — Ambulatory Visit: Payer: Medicare Other | Admitting: Pharmacist

## 2021-04-30 ENCOUNTER — Other Ambulatory Visit (HOSPITAL_COMMUNITY): Payer: Self-pay

## 2021-04-30 DIAGNOSIS — E7800 Pure hypercholesterolemia, unspecified: Secondary | ICD-10-CM

## 2021-04-30 DIAGNOSIS — G8929 Other chronic pain: Secondary | ICD-10-CM

## 2021-04-30 DIAGNOSIS — M549 Dorsalgia, unspecified: Secondary | ICD-10-CM

## 2021-04-30 DIAGNOSIS — F32A Depression, unspecified: Secondary | ICD-10-CM

## 2021-04-30 MED ORDER — PRALUENT 75 MG/ML ~~LOC~~ SOAJ
75.0000 mg | SUBCUTANEOUS | 2 refills | Status: DC
  Filled 2021-04-30: qty 2, 28d supply, fill #0
  Filled 2021-06-02: qty 2, 28d supply, fill #1
  Filled 2021-07-02: qty 2, 28d supply, fill #2

## 2021-04-30 NOTE — Patient Instructions (Signed)
Visit Information  PATIENT GOALS: Goals Addressed            This Visit's Progress   . Medication Monitoring       Patient Goals/Self-Care Activities . Over the next 90 days, patient will:  - take medications as prescribed collaborate with provider on medication access solutions         Patient verbalizes understanding of instructions provided today and agrees to view in [place].   Plan: Face to Face appointment with care management team member scheduled for: ~ 2 weeks  Catie Darnelle Maffucci, PharmD, [place], [place] Clinical Pharmacist [employer] at [employer] [phone]

## 2021-04-30 NOTE — Chronic Care Management (AMB) (Signed)
Chronic Care Management Pharmacy Note  04/30/2021 Name:  Angel Riddle MRN:  [id number] DOB:  Oct 04, 1956  Subjective: Angel Riddle is an 65 y.o. year old female who is a primary patient of Einar Pheasant, MD.  The CCM team was consulted for assistance with disease management and care coordination needs.    Engaged with patient by telephone for follow up visit in response to provider referral for pharmacy case management and/or care coordination services.   Consent to Services:  The patient was given information about Chronic Care Management services, agreed to services, and gave verbal consent prior to initiation of services.  Please see initial visit note for detailed documentation.   Patient Care Team: Einar Pheasant, MD as PCP - General (Internal Medicine) Einar Pheasant, MD (Internal Medicine) Bary Castilla [place] Gleason, MD (General Surgery) De Hollingshead, RPH-CPP (Pharmacist)  Recent office visits:  5/16- acute visit for bronchitis. Prescribed methylprednisolone, benzonatate, albuterol inhaler.   Recent consult visits:  5/20 - Dr. Babs Bertin Neurosurgery (note not visible)  Hospital visits: None in previous 6 months  Objective:  Lab Results  Component Value Date   CREATININE 0.82 03/05/2021   CREATININE 0.88 11/05/2020   CREATININE 0.82 07/02/2020    Lab Results  Component Value Date   HGBA1C 6.0 03/05/2021   Last diabetic Eye exam: No results found for: HMDIABEYEEXA  Last diabetic Foot exam: No results found for: HMDIABFOOTEX      Component Value Date/Time   CHOL 241 (H) 03/05/2021 1021   TRIG 205.0 (H) 03/05/2021 1021   HDL 50.30 03/05/2021 1021   CHOLHDL 5 03/05/2021 1021   VLDL 41.0 (H) 03/05/2021 1021   LDLCALC 134 (H) 11/05/2020 1028   LDLDIRECT 158.0 03/05/2021 1021    Hepatic Function Latest Ref Rng & Units 03/05/2021 11/05/2020 07/02/2020  Total Protein 6.0 - 8.3 g/dL 6.8 6.8 6.7  Albumin 3.5 - 5.2 g/dL 4.2 4.1 4.1  AST 0 - 37  U/L '19 16 16  ' ALT 0 - 35 U/L '10 6 9  ' Alk Phosphatase 39 - 117 U/L 105 84 102  Total Bilirubin 0.2 - 1.2 mg/dL 0.5 0.5 0.7  Bilirubin, Direct 0.0 - 0.3 mg/dL 0.1 0.1 0.1    Lab Results  Component Value Date/Time   TSH 1.64 03/05/2021 10:21 AM   TSH 1.18 03/31/2020 10:06 AM    CBC Latest Ref Rng & Units 03/05/2021 03/31/2020 02/07/2019  WBC 4.0 - 10.5 K/uL 11.0(H) 9.1 6.7  Hemoglobin 12.0 - 15.0 g/dL 13.6 14.0 13.4  Hematocrit 36.0 - 46.0 % 40.6 41.9 39.2  Platelets 150.0 - 400.0 K/uL 299.0 360.0 320.0    Lab Results  Component Value Date/Time   VD25OH 21.26 (L) 12/21/2019 03:43 PM    Clinical ASCVD: No  The 10-year ASCVD risk score Mikey Bussing DC Jr., et al., 2013) is: 14.4%   Values used to calculate the score:     Age: 74 years     Sex: Female     Is Non-Hispanic African American: No     Diabetic: No     Tobacco smoker: Yes     Systolic Blood Pressure: 412 mmHg     Is BP treated: Yes     HDL Cholesterol: 50.3 mg/dL     Total Cholesterol: 241 mg/dL      Social History   Tobacco Use  Smoking Status Current Every Day Smoker  . Packs/day: 1.00  . Years: 37.00  . Pack years: 37.00  . Types: Cigarettes  .  Start date: 08/07/2019  Smokeless Tobacco Never Used   BP Readings from Last 3 Encounters:  03/09/21 128/72  11/07/20 128/70  07/05/20 (!) 128/86   Pulse Readings from Last 3 Encounters:  03/09/21 75  11/07/20 94  07/04/20 90   Wt Readings from Last 3 Encounters:  03/09/21 170 lb (77.1 kg)  02/11/21 165 lb (74.8 kg)  11/07/20 169 lb (76.7 kg)    Assessment: Review of patient past medical history, allergies, medications, health status, including review of consultants reports, laboratory and other test data, was performed as part of comprehensive evaluation and provision of chronic care management services.   SDOH:  (Social Determinants of Health) assessments and interventions performed:  SDOH Interventions   Flowsheet Row Most Recent Value  SDOH Interventions    Financial Strain Interventions Other (Comment)  [healthwell foundation funding]      CCM Care Plan  Allergies  Allergen Reactions  . Levaquin [Levofloxacin In D5w] Other (See Comments)    Muscle pain  . Adhesive [Tape] Other (See Comments)    Pulls skin, anything that does not pull is recommended   . Doxycycline Nausea And Vomiting  . Ivp Dye [Iodinated Diagnostic Agents] Nausea And Vomiting and Other (See Comments)     Eyes turned blood red    Medications Reviewed Today    Reviewed by De Hollingshead, RPH-CPP (Pharmacist) on 04/10/21 at 1117  Med List Status: <None>  Medication Order Taking? Sig Documenting Provider Last Dose Status Informant  aspirin EC 81 MG tablet [id number] Yes Take 1 tablet (81 mg total) by mouth daily. Swallow whole. Einar Pheasant, MD Taking Active   ezetimibe (ZETIA) 10 MG tablet [id number] Yes Take 1 tablet (10 mg total) by mouth daily. Einar Pheasant, MD Taking Active   FLUoxetine (PROZAC) 40 MG capsule [id number] Yes Take 1 capsule (40 mg total) by mouth daily. Einar Pheasant, MD Taking Active   gabapentin (NEURONTIN) 300 MG capsule [id number] Yes Take 1 capsule (300 mg total) by mouth at bedtime. Einar Pheasant, MD Taking Active   hydrochlorothiazide (MICROZIDE) 12.5 MG capsule [id number] Yes Take 1 capsule (12.5 mg total) by mouth daily. Einar Pheasant, MD Taking Active   HYDROcodone-acetaminophen [facility]) 7.5-325 MG tablet [id number] Yes Take 1.5 tablets by mouth as needed. Using 1-2 times daily [provider] Taking Active   meloxicam (MOBIC) 15 MG tablet [id number] Yes Take 1 tablet (15 mg total) by mouth daily. Einar Pheasant, MD Taking Active   pantoprazole (PROTONIX) 40 MG tablet [id number] Yes Take 1 tablet (40 mg total) by mouth daily. Einar Pheasant, MD Taking Active   potassium chloride (KLOR-CON) 10 MEQ tablet [id number] Yes Take 1 tablet (10 mEq total) by mouth daily. Einar Pheasant, MD Taking Active   pravastatin (PRAVACHOL) 10 MG  tablet [id number] Yes Take 1 tablet (10 mg total) by mouth daily. Einar Pheasant, MD Taking Active   tiZANidine (ZANAFLEX) 4 MG tablet [id number] Yes Take 2-4 mg by mouth 3 times daily as needed for muscle spasms [provider] Taking Active Self          Patient Active Problem List   Diagnosis Date Noted  . Double vision 03/14/2021  . Female bladder prolapse 03/14/2021  . Tinnitus 07/05/2020  . Chronic left sacroiliac pain 05/21/2020  . Chronic pain of left knee 05/21/2020  . Bilateral hip pain 05/21/2020  . Left elbow pain 05/21/2020  . Fatigue 12/30/2019  . Weight loss counseling, encounter for 12/30/2019  . Breast cancer screening 07/07/2019  . Aortic  atherosclerosis ([place]) 02/10/2019  . GERD (gastroesophageal reflux disease) 06/20/2018  . Chest pain 01/06/2018  . Lumbar stenosis with neurogenic claudication 09/15/2017  . BMI 31.0-31.9,adult 04/25/2017  . Hyperglycemia 04/25/2017  . Personal history of tobacco use, presenting hazards to health 12/15/2016  . Nausea without vomiting 03/29/2016  . Personal history of colonic polyps   . Anal fissure   . Benign neoplasm of sigmoid colon   . Left arm pain 08/03/2015  . Cough 07/30/2015  . Urinary hesitancy 07/30/2015  . Health care maintenance 01/12/2015  . Rectal irritation 11/24/2014  . Gallstones 04/01/2014  . Abdominal fullness 03/10/2014  . Numbness and tingling 10/02/2013  . Hypertension 10/02/2013  . Chronic back pain 07/18/2013  . Depression 07/18/2013  . Anxiety 07/18/2013  . Panic attacks 07/18/2013  . Environmental allergies 07/18/2013  . Hypercholesterolemia 07/18/2013  . History of colonic polyps 07/18/2013    Immunization History  Administered Date(s) Administered  . Influenza,inj,Quad PF,6+ Mos 09/05/2013, 08/05/2014, 08/20/2015, 08/23/2019  . Influenza-Unspecified 09/11/2016, 09/03/2017, 08/21/2018, 11/07/2020  . Tdap 06/15/2018    Conditions to be addressed/monitored: HTN, HLD and  DMII  Care Plan : Medication Management  Updates made by De Hollingshead, RPH-CPP since 04/30/2021 12:00 AM    Problem: HLD, Depression, Anxiety, Chronic Pain     Long-Range Goal: Disease Progression Prevention   Start Date: 04/10/2021  This Visit's Progress: On track  Recent Progress: On track  Priority: High  Note:   Current Barriers:  . Unable to achieve control of cholesterol   Pharmacist Clinical Goal(s):  Marland Kitchen Over the next 90 days, patient will achieve control of cholesterol through collaboration with PharmD and provider.   Interventions: . 1:1 collaboration with Einar Pheasant, MD regarding development and update of comprehensive plan of care as evidenced by provider attestation and co-signature . Inter-disciplinary care team collaboration (see longitudinal plan of care) . Comprehensive medication review performed; medication list updated in electronic medical record  Health Maintenance: . S/p bronchitis. Completed methylprednisolone course. Continues using benzonatate 200 mg PRN, albuterol HFA PRN- using Q4H to help with breathing, phelgm. Reports her energy level has improved today.   Hyperlipidemia, ASCVD risk reduction: . Uncontrolled; current treatment: pravastatin 10 mg daily, ezetimibe 10 mg daily, prescribed Praluent 75 mg Q14 days, however, copay was >$400 (high deductible). . Medications previously tried: pravastatin 20 mg daily - myalgias; atorvastatin, rosuvastatin - myalgias, she does not remember doses and pharmacy records do not go back that far.  . Antiplatelet regimen: aspirin 81 mg daily  . Collaborated with patient to complete three way call to [employer]. Enrolled in assistance fund for hypercholesterolemia. Approved 03/31/21-03/30/22. $2500 over 12 months. ID: [id number] ; Group: [id number]; Kara Dies: [id number]; PCN: [id number] ; [employer] ID: [id number]  . 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 Discussed trying another pharmacy in [place]  vs [place] mail order pharmacy. Patient agreeable to [facility]. Spoke with Brayton Layman, PharmD at [facility]. They will fill Praluent w/ insurance + [employer] and mail out to patient. She will bring with her to upcoming appointment with PCP and we'll education on administration  Hypertension: . Controlled; current treatment: HCTZ 12.5 mg daily, potassium 10 mEq daily  . Previously recommended to continue current regimen at this time.   Depression/Anxiety, Chronic Pain . Uncontrolled; current treatment: fluoxetine 40 mg daily;  o Hx buspirone with no benefit. Discussion last visit w/ Dr. Nicki Reaper about avoiding BZD. Hx duloxetine  . Chronic pain: current regimen: hydrocodone/acetaminophen 7.5/325 mg  1.5 tablet PRN, 0-2 times daily, meloxicam 15 mg daily, gabapentin 300 mg QPM, tizanidine 4 mg PRN, Reports no benefit but no sedation from gabapentin. Continues to follow w/ Dr. Arnoldo Morale at [facility] . Stress caring for her husband. Personality changes s/p CVA. Lung cancer. Notes that there seem to be lots of arguments.  . Previously discussed that gabapentin dose can be increased moving forward. Encouraged to discuss increasing gabapentin dose with Dr. Nicki Reaper or Dr. Arnoldo Morale moving forward.   Patient Goals/Self-Care Activities . Over the next 90 days, patient will:  - take medications as prescribed collaborate with provider on medication access solutions  Follow Up Plan: Telephone follow up appointment with care management team member scheduled for: 1 week     Medication Assistance: [place] through 03/30/22  Patient's preferred pharmacy is:  [facility], [place] [address] Phone: [phone] Fax: [place] [address] Phone: [phone] Fax: [phone]   Follow Up:  Patient agrees to Care Plan and Follow-up.  Plan: Face to  Face appointment with care management team member scheduled for: ~ 2 weeks  Catie Darnelle Maffucci, PharmD, [place], [place] Clinical Pharmacist [employer] at [employer] [phone]

## 2021-05-06 ENCOUNTER — Other Ambulatory Visit: Payer: Self-pay

## 2021-05-06 ENCOUNTER — Ambulatory Visit (INDEPENDENT_AMBULATORY_CARE_PROVIDER_SITE_OTHER): Payer: Medicare Other | Admitting: Pharmacist

## 2021-05-06 ENCOUNTER — Encounter: Payer: Self-pay | Admitting: Internal Medicine

## 2021-05-06 ENCOUNTER — Ambulatory Visit (INDEPENDENT_AMBULATORY_CARE_PROVIDER_SITE_OTHER): Payer: Medicare Other | Admitting: Internal Medicine

## 2021-05-06 VITALS — BP 100/60 | HR 94 | Temp 99.2°F | Ht 65.0 in

## 2021-05-06 DIAGNOSIS — I10 Essential (primary) hypertension: Secondary | ICD-10-CM

## 2021-05-06 DIAGNOSIS — R739 Hyperglycemia, unspecified: Secondary | ICD-10-CM

## 2021-05-06 DIAGNOSIS — K219 Gastro-esophageal reflux disease without esophagitis: Secondary | ICD-10-CM

## 2021-05-06 DIAGNOSIS — M48062 Spinal stenosis, lumbar region with neurogenic claudication: Secondary | ICD-10-CM

## 2021-05-06 DIAGNOSIS — I700 Atherosclerosis of aorta: Secondary | ICD-10-CM

## 2021-05-06 DIAGNOSIS — D72829 Elevated white blood cell count, unspecified: Secondary | ICD-10-CM | POA: Diagnosis not present

## 2021-05-06 DIAGNOSIS — M791 Myalgia, unspecified site: Secondary | ICD-10-CM

## 2021-05-06 DIAGNOSIS — F32A Depression, unspecified: Secondary | ICD-10-CM | POA: Diagnosis not present

## 2021-05-06 DIAGNOSIS — E7800 Pure hypercholesterolemia, unspecified: Secondary | ICD-10-CM | POA: Diagnosis not present

## 2021-05-06 DIAGNOSIS — M79605 Pain in left leg: Secondary | ICD-10-CM

## 2021-05-06 DIAGNOSIS — T466X5A Adverse effect of antihyperlipidemic and antiarteriosclerotic drugs, initial encounter: Secondary | ICD-10-CM

## 2021-05-06 MED ORDER — GABAPENTIN 300 MG PO CAPS: ORAL_CAPSULE | ORAL | 2 refills | Status: DC

## 2021-05-06 NOTE — Chronic Care Management (AMB) (Signed)
Chronic Care Management Pharmacy Note  05/06/2021 Name:  Angel Riddle MRN:  [id number] DOB:  1956/01/24  Subjective: Angel Riddle is an 65 y.o. year old female who is a primary patient of Einar Pheasant, MD.  The CCM team was consulted for assistance with disease management and care coordination needs.    Engaged with patient face to face for follow up visit in response to provider referral for pharmacy case management and/or care coordination services.   Consent to Services:  The patient was given information about Chronic Care Management services, agreed to services, and gave verbal consent prior to initiation of services.  Please see initial visit note for detailed documentation.   Patient Care Team: Einar Pheasant, MD as PCP - General (Internal Medicine) Einar Pheasant, MD (Internal Medicine) Bary Castilla, Forest Gleason, MD (General Surgery) De Hollingshead, RPH-CPP (Pharmacist)   Objective:  Lab Results  Component Value Date   CREATININE 0.82 03/05/2021   CREATININE 0.88 11/05/2020   CREATININE 0.82 07/02/2020    Lab Results  Component Value Date   HGBA1C 6.0 03/05/2021   Last diabetic Eye exam: No results found for: HMDIABEYEEXA  Last diabetic Foot exam: No results found for: HMDIABFOOTEX      Component Value Date/Time   CHOL 241 (H) 03/05/2021 1021   TRIG 205.0 (H) 03/05/2021 1021   HDL 50.30 03/05/2021 1021   CHOLHDL 5 03/05/2021 1021   VLDL 41.0 (H) 03/05/2021 1021   LDLCALC 134 (H) 11/05/2020 1028   LDLDIRECT 158.0 03/05/2021 1021    Hepatic Function Latest Ref Rng & Units 03/05/2021 11/05/2020 07/02/2020  Total Protein 6.0 - 8.3 g/dL 6.8 6.8 6.7  Albumin 3.5 - 5.2 g/dL 4.2 4.1 4.1  AST 0 - 37 U/L '19 16 16  ' ALT 0 - 35 U/L '10 6 9  ' Alk Phosphatase 39 - 117 U/L 105 84 102  Total Bilirubin 0.2 - 1.2 mg/dL 0.5 0.5 0.7  Bilirubin, Direct 0.0 - 0.3 mg/dL 0.1 0.1 0.1    Lab Results  Component Value Date/Time   TSH 1.64 03/05/2021 10:21 AM   TSH  1.18 03/31/2020 10:06 AM    CBC Latest Ref Rng & Units 03/05/2021 03/31/2020 02/07/2019  WBC 4.0 - 10.5 K/uL 11.0(H) 9.1 6.7  Hemoglobin 12.0 - 15.0 g/dL 13.6 14.0 13.4  Hematocrit 36.0 - 46.0 % 40.6 41.9 39.2  Platelets 150.0 - 400.0 K/uL 299.0 360.0 320.0    Lab Results  Component Value Date/Time   VD25OH 21.26 (L) 12/21/2019 03:43 PM    Clinical ASCVD: Yes  The 10-year ASCVD risk score Mikey Bussing DC Jr., et al., 2013) is: 9.6%   Values used to calculate the score:     Age: 51 years     Sex: Female     Is Non-Hispanic African American: No     Diabetic: No     Tobacco smoker: Yes     Systolic Blood Pressure: 585 mmHg     Is BP treated: Yes     HDL Cholesterol: 50.3 mg/dL     Total Cholesterol: 241 mg/dL    Other: (CHADS2VASc if Afib, PHQ9 if depression, MMRC or CAT for COPD, ACT, DEXA)  Social History   Tobacco Use  Smoking Status Current Every Day Smoker  . Packs/day: 1.00  . Years: 37.00  . Pack years: 37.00  . Types: Cigarettes  . Start date: 08/07/2019  Smokeless Tobacco Never Used   BP Readings from Last 3 Encounters:  05/06/21 100/60  03/09/21 128/72  11/07/20 128/70   Pulse Readings from Last 3 Encounters:  05/06/21 94  03/09/21 75  11/07/20 94   Wt Readings from Last 3 Encounters:  03/09/21 170 lb (77.1 kg)  02/11/21 165 lb (74.8 kg)  11/07/20 169 lb (76.7 kg)    Assessment: Review of patient past medical history, allergies, medications, health status, including review of consultants reports, laboratory and other test data, was performed as part of comprehensive evaluation and provision of chronic care management services.   SDOH:  (Social Determinants of Health) assessments and interventions performed:    CCM Care Plan  Allergies  Allergen Reactions  . Gadolinium Nausea And Vomiting  . Levaquin [Levofloxacin In D5w] Other (See Comments)    Muscle pain  . Adhesive [Tape] Other (See Comments)    Pulls skin, anything that does not pull is recommended    . Doxycycline Nausea And Vomiting  . Ivp Dye [Iodinated Diagnostic Agents] Nausea And Vomiting and Other (See Comments)     Eyes turned blood red    Medications Reviewed Today    Reviewed by Earlyne Iba, CMA (Certified Medical Assistant) on 05/06/21 at 1137  Med List Status: <None>  Medication Order Taking? Sig Documenting Provider Last Dose Status Informant  albuterol (VENTOLIN HFA) 108 (90 Base) MCG/ACT inhaler [id number] Yes Inhale 1-2 puffs into the lungs every 4 (four) hours as needed. [provider] Taking Active   Alirocumab (PRALUENT) 75 MG/ML SOAJ [id number] Yes Inject 75 mg into the skin every 14 (fourteen) days. Einar Pheasant, MD Taking Active   aspirin EC 81 MG tablet [id number] Yes Take 1 tablet (81 mg total) by mouth daily. Swallow whole. Einar Pheasant, MD Taking Active   benzonatate (TESSALON) 200 MG capsule [id number] No Take 200 mg by mouth as needed.  Patient not taking: Reported on 05/06/2021   [provider] Not Taking Consider Medication Status and Discontinue   ezetimibe (ZETIA) 10 MG tablet [id number] Yes Take 1 tablet (10 mg total) by mouth daily. Einar Pheasant, MD Taking Active   FLUoxetine (PROZAC) 40 MG capsule [id number] Yes Take 1 capsule (40 mg total) by mouth daily. Einar Pheasant, MD Taking Active   gabapentin (NEURONTIN) 300 MG capsule [id number] Yes Take 1 capsule (300 mg total) by mouth at bedtime. Einar Pheasant, MD Taking Active   hydrochlorothiazide (MICROZIDE) 12.5 MG capsule [id number] Yes Take 1 capsule (12.5 mg total) by mouth daily. Einar Pheasant, MD Taking Active   HYDROcodone-acetaminophen [facility]) 7.5-325 MG tablet [id number] Yes Take 1.5 tablets by mouth as needed. Using 1-2 times daily [provider] Taking Active   meloxicam (MOBIC) 15 MG tablet [id number] Yes Take 1 tablet (15 mg total) by mouth daily. Einar Pheasant, MD Taking Active   pantoprazole (PROTONIX) 40 MG tablet [id number] Yes Take 1 tablet (40 mg total)  by mouth daily. Einar Pheasant, MD Taking Active   potassium chloride (KLOR-CON) 10 MEQ tablet [id number] Yes Take 1 tablet (10 mEq total) by mouth daily. Einar Pheasant, MD Taking Active   pravastatin (PRAVACHOL) 10 MG tablet [id number] Yes Take 1 tablet (10 mg total) by mouth daily. Einar Pheasant, MD Taking Active   tiZANidine (ZANAFLEX) 4 MG tablet [id number] Yes Take 2-4 mg by mouth 3 times daily as needed for muscle spasms [provider] Taking Active Self          Patient Active Problem List   Diagnosis Date Noted  . Double vision 03/14/2021  . Female bladder prolapse 03/14/2021  . Tinnitus 07/05/2020  .  Chronic left sacroiliac pain 05/21/2020  . Chronic pain of left knee 05/21/2020  . Bilateral hip pain 05/21/2020  . Left elbow pain 05/21/2020  . Fatigue 12/30/2019  . Weight loss counseling, encounter for 12/30/2019  . Breast cancer screening 07/07/2019  . Aortic atherosclerosis ([place]) 02/10/2019  . GERD (gastroesophageal reflux disease) 06/20/2018  . Chest pain 01/06/2018  . Lumbar stenosis with neurogenic claudication 09/15/2017  . BMI 31.0-31.9,adult 04/25/2017  . Hyperglycemia 04/25/2017  . Personal history of tobacco use, presenting hazards to health 12/15/2016  . Nausea without vomiting 03/29/2016  . Personal history of colonic polyps   . Anal fissure   . Benign neoplasm of sigmoid colon   . Left arm pain 08/03/2015  . Cough 07/30/2015  . Urinary hesitancy 07/30/2015  . Health care maintenance 01/12/2015  . Rectal irritation 11/24/2014  . Gallstones 04/01/2014  . Abdominal fullness 03/10/2014  . Numbness and tingling 10/02/2013  . Hypertension 10/02/2013  . Chronic back pain 07/18/2013  . Depression 07/18/2013  . Anxiety 07/18/2013  . Panic attacks 07/18/2013  . Environmental allergies 07/18/2013  . Hypercholesterolemia 07/18/2013  . History of colonic polyps 07/18/2013    Immunization History  Administered Date(s) Administered  .  Influenza,inj,Quad PF,6+ Mos 09/05/2013, 08/05/2014, 08/20/2015, 08/23/2019  . Influenza-Unspecified 09/11/2016, 09/03/2017, 08/21/2018, 11/07/2020  . Tdap 06/15/2018    Conditions to be addressed/monitored: HTN, HLD and Depression  Care Plan : Medication Management  Updates made by De Hollingshead, RPH-CPP since 05/06/2021 12:00 AM    Problem: HLD, Depression, Anxiety, Chronic Pain     Long-Range Goal: Disease Progression Prevention   Start Date: 04/10/2021  This Visit's Progress: On track  Recent Progress: On track  Priority: High  Note:   Current Barriers:  . Unable to achieve control of cholesterol   Pharmacist Clinical Goal(s):  Marland Kitchen Over the next 90 days, patient will achieve control of cholesterol through collaboration with PharmD and provider.   Interventions: . 1:1 collaboration with Einar Pheasant, MD regarding development and update of comprehensive plan of care as evidenced by provider attestation and co-signature . Inter-disciplinary care team collaboration (see longitudinal plan of care) . Comprehensive medication review performed; medication list updated in electronic medical record   Hyperlipidemia, ASCVD risk reduction: . Uncontrolled; current treatment: pravastatin 10 mg daily, ezetimibe 10 mg daily, prescribed Praluent 75 mg Q14 days . Medications previously tried: pravastatin 20 mg daily - myalgias; atorvastatin, rosuvastatin - myalgias, she does not remember doses and pharmacy records do not go back that far.  . Antiplatelet regimen: aspirin 81 mg daily  . Demonstrated injection technique for Praluent. Patient administered first injection today in office. Follow for tolerability and efficacy. Recommend lipid panel in 4-12 weeks.   Hypertension: . Controlled; current treatment: HCTZ 12.5 mg daily, potassium 10 mEq daily  . Previously recommended to continue current regimen at this time.   Depression/Anxiety, Chronic Pain . Uncontrolled; current treatment:  fluoxetine 40 mg daily;  o Hx buspirone with no benefit. Discussion last visit w/ Dr. Nicki Reaper about avoiding BZD. Hx duloxetine  . Chronic pain: current regimen: hydrocodone/acetaminophen 7.5/325 mg 1.5 tablet PRN, 0-2 times daily, meloxicam 15 mg daily, gabapentin 300 mg QPM, tizanidine 4 mg PRN, Reports no benefit but no sedation from gabapentin. Continues to follow w/ Dr. Arnoldo Morale at [facility] . Previously discussed that gabapentin dose can be increased moving forward. Encouraged to discuss increasing gabapentin dose with Dr. Nicki Reaper or Dr. Arnoldo Morale moving forward.   Patient Goals/Self-Care Activities . Over the next  90 days, patient will:  - take medications as prescribed collaborate with provider on medication access solutions  Follow Up Plan: Telephone follow up appointment with care management team member scheduled for: 6 weeks     Medication Assistance: [place] approval through 03/2022  Patient's preferred pharmacy is:  [place], [place] [address] Phone: [phone] Fax: [place] [address] Phone: [phone] Fax: [phone]  Follow Up:  Patient agrees to Care Plan and Follow-up.  Plan: Telephone follow up appointment with care management team member scheduled for:  ~ 6 weeks  Catie Darnelle Maffucci, PharmD, [place], [place] Clinical Pharmacist [employer] at [employer] [phone]

## 2021-05-06 NOTE — Patient Instructions (Signed)
Visit Information  PATIENT GOALS: Goals Addressed            This Visit's Progress   . Medication Monitoring       Patient Goals/Self-Care Activities . Over the next 90 days, patient will:  - take medications as prescribed collaborate with provider on medication access solutions       Patient verbalizes understanding of instructions provided today and agrees to view in [place].   Plan: Telephone follow up appointment with care management team member scheduled for:  ~ 6 weeks  Catie Darnelle Maffucci, PharmD, [place], [place] Clinical Pharmacist [employer] at [employer] [phone]

## 2021-05-06 NOTE — Progress Notes (Signed)
Patient ID: Angel Riddle, female   DOB: 1956-07-21, 65 y.o.   MRN: [id number]   Subjective:    Patient ID: Angel Riddle, female    DOB: 08-Aug-1956, 65 y.o.   MRN: [id number]  HPI This visit occurred during the SARS-CoV-2 public health emergency.  Safety protocols were in place, including screening questions prior to the visit, additional usage of staff PPE, and extensive cleaning of exam room while observing appropriate contact time as indicated for disinfecting solutions.  Patient here for a scheduled follow up.  Here to follow up regarding her leg pain and sleep issues.  Also following up regarding her cholesterol and blood pressure.  Was recently diagnosed with bronchitis.  Treated with prednisone and tessalon perles.  Has albuterol.  Discussed spacer.  Cough is better.  No sob reported.  No chest pain.  Feeling better.  Increased stress with her husband's medical issues.  Still with left leg pain - lateral leg and extends around knee and down to lower leg.  Saw Dr Arnoldo Morale recently.  Discussed.  Planning f/u in 6 months.  Wears a back brace.  Helps.  No abdominal pain.  Bowels stable.  Discussed with pharmacy regarding cholesterol medication.    Past Medical History:  Diagnosis Date  . Allergy   . Anxiety   . Arthritis   . Cancer (HCC)    basal cell nose  . Chronic back pain    degenerative disc disease  . Degenerative disc disease, lumbar   . Depression   . Dyspnea    with exertion / pain  . Gallstones   . GERD (gastroesophageal reflux disease)   . Heart murmur    followed by PCP  . Hx of colonic polyp   . Hx: UTI (urinary tract infection)   . Hypercholesterolemia   . Hypertension   . Panic attacks    H/O  . Wears dentures    partial upper and lower   Past Surgical History:  Procedure Laterality Date  . ABDOMINAL HYSTERECTOMY  1992   partial  . BACK SURGERY     lumbar fusion  . CATARACT EXTRACTION Bilateral 2010,2011  . CHOLECYSTECTOMY  04-19-14  . COLONOSCOPY   07/02/11   DR. Byrnett  . COLONOSCOPY WITH PROPOFOL N/A 09/25/2015   Procedure: COLONOSCOPY WITH PROPOFOL;  Surgeon: Lucilla Lame, MD;  Location: [facility];  Service: Endoscopy;  Laterality: N/A;  . FOOT SURGERY Right 2009  . MOUTH SURGERY    . POLYPECTOMY  09/25/2015   Procedure: POLYPECTOMY;  Surgeon: Lucilla Lame, MD;  Location: [facility];  Service: Endoscopy;;  . skin surgery Left 08/27/13   Basal cell removal-left nostril  . TONSILLECTOMY AND ADENOIDECTOMY  1969   Family History  Problem Relation Age of Onset  . Hyperlipidemia Mother   . Hypertension Mother   . Alcohol abuse Father   . Hyperlipidemia Father   . Heart disease Father   . Diabetes Father   . Hyperlipidemia Sister   . Lung cancer Brother   . Hyperlipidemia Brother   . Breast cancer Neg Hx    Social History   Socioeconomic History  . Marital status: Married    Spouse name: Not on file  . Number of children: 2  . Years of education: Not on file  . Highest education level: Not on file  Occupational History  . Not on file  Tobacco Use  . Smoking status: Current Every Day Smoker    Packs/day: 1.00  Years: 37.00    Pack years: 37.00    Types: Cigarettes    Start date: 08/07/2019  . Smokeless tobacco: Never Used  Vaping Use  . Vaping Use: Former  Substance and Sexual Activity  . Alcohol use: Yes    Alcohol/week: 1.0 standard drink    Types: 1 Glasses of wine per week  . Drug use: No  . Sexual activity: Not on file  Other Topics Concern  . Not on file  Social History Narrative  . Not on file   Social Determinants of Health   Financial Resource Strain: Medium Risk  . Difficulty of Paying Living Expenses: Somewhat hard  Food Insecurity: Not on file  Transportation Needs: Not on file  Physical Activity: Not on file  Stress: Stress Concern Present  . Feeling of Stress : Rather much  Social Connections: Not on file    Outpatient Encounter Medications as of 05/06/2021  Medication  Sig  . albuterol (VENTOLIN HFA) 108 (90 Base) MCG/ACT inhaler Inhale 1-2 puffs into the lungs every 4 (four) hours as needed.  . Alirocumab (PRALUENT) 75 MG/ML SOAJ Inject 75 mg into the skin every 14 (fourteen) days.  . aspirin EC 81 MG tablet Take 1 tablet (81 mg total) by mouth daily. Swallow whole.  . FLUoxetine (PROZAC) 40 MG capsule Take 1 capsule (40 mg total) by mouth daily.  . hydrochlorothiazide (MICROZIDE) 12.5 MG capsule Take 1 capsule (12.5 mg total) by mouth daily.  . HYDROcodone-acetaminophen (NORCO) 7.5-325 MG tablet Take 1.5 tablets by mouth as needed. Using 1-2 times daily  . meloxicam (MOBIC) 15 MG tablet Take 1 tablet (15 mg total) by mouth daily.  . pantoprazole (PROTONIX) 40 MG tablet Take 1 tablet (40 mg total) by mouth daily.  . tiZANidine (ZANAFLEX) 4 MG tablet Take 2-4 mg by mouth 3 times daily as needed for muscle spasms  . [DISCONTINUED] ezetimibe (ZETIA) 10 MG tablet Take 1 tablet (10 mg total) by mouth daily.  . [DISCONTINUED] gabapentin (NEURONTIN) 300 MG capsule Take 1 capsule (300 mg total) by mouth at bedtime.  . [DISCONTINUED] potassium chloride (KLOR-CON) 10 MEQ tablet Take 1 tablet (10 mEq total) by mouth daily.  . [DISCONTINUED] pravastatin (PRAVACHOL) 10 MG tablet Take 1 tablet (10 mg total) by mouth daily.  . gabapentin (NEURONTIN) 300 MG capsule Take 2 capsules q hs  . [DISCONTINUED] benzonatate (TESSALON) 200 MG capsule Take 200 mg by mouth as needed. (Patient not taking: Reported on 05/06/2021)   No facility-administered encounter medications on file as of 05/06/2021.    Review of Systems  Constitutional: Negative for appetite change and unexpected weight change.  HENT: Negative for congestion and sinus pressure.   Respiratory: Negative for cough, chest tightness and shortness of breath.   Cardiovascular: Negative for chest pain, palpitations and leg swelling.  Gastrointestinal: Negative for abdominal pain, diarrhea, nausea and vomiting.   Genitourinary: Negative for difficulty urinating and dysuria.  Musculoskeletal: Negative for joint swelling and myalgias.       Back/leg pain as outlined.  Seeing Dr Jenkins.   Skin: Negative for color change and rash.  Neurological: Negative for dizziness, light-headedness and headaches.  Psychiatric/Behavioral: Negative for agitation and dysphoric mood.       Increased stress as outlined.        Objective:    Physical Exam Vitals reviewed.  Constitutional:      General: She is not in acute distress.    Appearance: Normal appearance.  HENT:     Head:   Normocephalic and atraumatic.     Right Ear: External ear normal.     Left Ear: External ear normal.  Eyes:     General: No scleral icterus.       Right eye: No discharge.        Left eye: No discharge.     Conjunctiva/sclera: Conjunctivae normal.  Neck:     Thyroid: No thyromegaly.  Cardiovascular:     Rate and Rhythm: Normal rate and regular rhythm.  Pulmonary:     Effort: No respiratory distress.     Breath sounds: Normal breath sounds. No wheezing.  Abdominal:     General: Bowel sounds are normal.     Palpations: Abdomen is soft.     Tenderness: There is no abdominal tenderness.  Musculoskeletal:        General: No swelling or tenderness.     Cervical back: Neck supple. No tenderness.  Lymphadenopathy:     Cervical: No cervical adenopathy.  Skin:    Findings: No erythema or rash.  Neurological:     Mental Status: She is alert.  Psychiatric:        Mood and Affect: Mood normal.        Behavior: Behavior normal.     BP 100/60 (BP Location: Left Arm, Patient Position: Sitting, Cuff Size: Large)   Pulse 94   Temp 99.2 F (37.3 C) (Oral)   Ht 5' 5" (1.651 m)   SpO2 98%   BMI 28.29 kg/m  Wt Readings from Last 3 Encounters:  03/09/21 170 lb (77.1 kg)  02/11/21 165 lb (74.8 kg)  11/07/20 169 lb (76.7 kg)     Lab Results  Component Value Date   WBC 11.0 (H) 03/05/2021   HGB 13.6 03/05/2021   HCT 40.6  03/05/2021   PLT 299.0 03/05/2021   GLUCOSE 93 03/05/2021   CHOL 241 (H) 03/05/2021   TRIG 205.0 (H) 03/05/2021   HDL 50.30 03/05/2021   LDLDIRECT 158.0 03/05/2021   LDLCALC 134 (H) 11/05/2020   ALT 10 03/05/2021   AST 19 03/05/2021   NA 139 03/05/2021   K 4.0 03/05/2021   CL 101 03/05/2021   CREATININE 0.82 03/05/2021   BUN 19 03/05/2021   CO2 29 03/05/2021   TSH 1.64 03/05/2021   HGBA1C 6.0 03/05/2021    MR Brain W Wo Contrast  Result Date: 04/07/2021 CLINICAL DATA:  [place]. EXAM: MRI HEAD WITHOUT AND WITH CONTRAST TECHNIQUE: Multiplanar, multiecho pulse sequences of the brain and surrounding structures were obtained without and with intravenous contrast. CONTRAST:  7.5mL GADAVIST GADOBUTROL 1 MMOL/ML IV SOLN COMPARISON:  Head CT July 11, 2018.  MRI of the brain Apr 30, 2010. FINDINGS: Brain: No acute infarction, hemorrhage, hydrocephalus, extra-axial collection or mass lesion. Few scattered foci of T2 hyperintensity are seen within the white matter of the cerebral hemispheres, nonspecific. No focus of abnormal contrast enhancement. Vascular: Normal flow voids. Skull and upper cervical spine: Normal marrow signal. Sinuses/Orbits: Bilateral lens surgery. Paranasal sinuses are clear. Other: Partial empty sella, unchanged since 2011. IMPRESSION: Mild chronic white matter ischemic changes. Electronically Signed   By: Katyucia  De Macedo Rodrigues M.D.   On: 04/07/2021 16:45       Assessment & Plan:   Problem List Items Addressed This Visit    Aortic atherosclerosis (HCC)    Continue pravastatin and zetia.  Follow.       Depression    Continue prozac.  Off xanax.  Using gabapentin at night.  Follow.         GERD (gastroesophageal reflux disease)    No upper symptoms reported.  Continue prilosec.        Hypercholesterolemia    Low cholesterol diet and exercise.  Continue zetia and pravastatin.  Seeing Catie - Praluent.  Follow lipid panel and liver function tests.         Relevant Orders   Hepatic function panel   Lipid panel   Hyperglycemia    Low carb diet and exercise.  Follow met b and a1c.       Relevant Orders   Hemoglobin A1c   Hypertension    Continue hctz.  Blood pressure doing well.  Follow pressures.  Follow metabolic panel.       Relevant Orders   Basic metabolic panel   Leg pain    Leg pain as outlined.  On gabapentin.  Feels needs to adjust dose.  Will increase gabapentin to 624m q hs.  Follow.        Lumbar stenosis with neurogenic claudication    Seeing Dr JArnoldo Morale  F/u planned as outlined.        Relevant Medications   gabapentin (NEURONTIN) 300 MG capsule    Other Visit Diagnoses    Leukocytosis, unspecified type    -  Primary   Relevant Orders   CBC with Differential/Platelet       CEinar Pheasant MD

## 2021-05-07 ENCOUNTER — Other Ambulatory Visit: Payer: Self-pay | Admitting: Internal Medicine

## 2021-05-12 ENCOUNTER — Other Ambulatory Visit: Payer: Self-pay | Admitting: Internal Medicine

## 2021-05-13 DIAGNOSIS — M79606 Pain in leg, unspecified: Secondary | ICD-10-CM | POA: Insufficient documentation

## 2021-05-13 NOTE — Assessment & Plan Note (Signed)
Continue hctz.  Blood pressure doing well.  Follow pressures.  Follow metabolic panel.  

## 2021-05-13 NOTE — Assessment & Plan Note (Signed)
Leg pain as outlined.  On gabapentin.  Feels needs to adjust dose.  Will increase gabapentin to 600mg  q hs.  Follow.

## 2021-05-13 NOTE — Assessment & Plan Note (Signed)
Low cholesterol diet and exercise.  Continue zetia and pravastatin.  Seeing Catie - Praluent.  Follow lipid panel and liver function tests.

## 2021-05-13 NOTE — Assessment & Plan Note (Signed)
Continue pravastatin and zetia.  Follow.

## 2021-05-13 NOTE — Assessment & Plan Note (Signed)
Seeing Dr Arnoldo Morale.  F/u planned as outlined.

## 2021-05-13 NOTE — Assessment & Plan Note (Signed)
Continue prozac.  Off xanax.  Using gabapentin at night.  Follow.

## 2021-05-13 NOTE — Assessment & Plan Note (Signed)
No upper symptoms reported.  Continue prilosec.  

## 2021-05-13 NOTE — Assessment & Plan Note (Signed)
Low carb diet and exercise.  Follow met b and a1c.

## 2021-05-21 ENCOUNTER — Other Ambulatory Visit: Payer: Self-pay | Admitting: Internal Medicine

## 2021-06-02 ENCOUNTER — Other Ambulatory Visit (HOSPITAL_COMMUNITY): Payer: Self-pay

## 2021-06-04 ENCOUNTER — Telehealth: Payer: Self-pay | Admitting: Internal Medicine

## 2021-06-04 DIAGNOSIS — E538 Deficiency of other specified B group vitamins: Secondary | ICD-10-CM

## 2021-06-04 NOTE — Telephone Encounter (Signed)
Pt was agreeable to having lab checked first. Pt has been scheduled and lab has been ordered.

## 2021-06-04 NOTE — Telephone Encounter (Signed)
She has not had her B12 level checked since 2019. Do you want to check this prior to her restarting b12 injections?

## 2021-06-04 NOTE — Telephone Encounter (Signed)
Patient would like to start taking B12 injections again. She stated it has been a few years since she had one. Please let me know when orders are in and I will schedule. Thanks

## 2021-06-04 NOTE — Telephone Encounter (Signed)
Yes.  She if she would be agreeable to come in to have B12 level checked and then can see if B12 injections are needed.

## 2021-06-04 NOTE — Addendum Note (Signed)
Addended by: Adair Laundry on: 06/04/2021 03:39 PM   Modules accepted: Orders

## 2021-06-05 ENCOUNTER — Other Ambulatory Visit: Payer: Medicare Other

## 2021-06-14 ENCOUNTER — Other Ambulatory Visit: Payer: Self-pay | Admitting: Internal Medicine

## 2021-06-16 ENCOUNTER — Ambulatory Visit (INDEPENDENT_AMBULATORY_CARE_PROVIDER_SITE_OTHER): Payer: Medicare Other | Admitting: Pharmacist

## 2021-06-16 DIAGNOSIS — E7800 Pure hypercholesterolemia, unspecified: Secondary | ICD-10-CM

## 2021-06-16 DIAGNOSIS — I700 Atherosclerosis of aorta: Secondary | ICD-10-CM

## 2021-06-16 DIAGNOSIS — M791 Myalgia, unspecified site: Secondary | ICD-10-CM

## 2021-06-16 DIAGNOSIS — I10 Essential (primary) hypertension: Secondary | ICD-10-CM | POA: Diagnosis not present

## 2021-06-16 DIAGNOSIS — F32A Depression, unspecified: Secondary | ICD-10-CM | POA: Diagnosis not present

## 2021-06-16 DIAGNOSIS — T466X5A Adverse effect of antihyperlipidemic and antiarteriosclerotic drugs, initial encounter: Secondary | ICD-10-CM

## 2021-06-16 NOTE — Patient Instructions (Signed)
Visit Information  PATIENT GOALS:  Goals Addressed             This Visit's Progress    Medication Monitoring       Patient Goals/Self-Care Activities Over the next 90 days, patient will:  - take medications as prescribed collaborate with provider on medication access solutions          Patient verbalizes understanding of instructions provided today and agrees to view in [place].    Plan: Telephone follow up appointment with care management team member scheduled for:  ~ 12 weeks  Catie Darnelle Maffucci, PharmD, [place], [place] Clinical Pharmacist [employer] [facility] [employer] [phone]

## 2021-06-16 NOTE — Chronic Care Management (AMB) (Signed)
Chronic Care Management Pharmacy Note  06/16/2021 Name:  Angel Riddle MRN:  [id number] DOB:  08-13-1956  Subjective: Angel Riddle is an 65 y.o. year old female who is a primary patient of Einar Pheasant, MD.  The CCM team was consulted for assistance with disease management and care coordination needs.    Engaged with patient by telephone for follow up visit in response to provider referral for pharmacy case management and/or care coordination services.   Consent to Services:  The patient was given information about Chronic Care Management services, agreed to services, and gave verbal consent prior to initiation of services.  Please see initial visit note for detailed documentation.   Patient Care Team: Einar Pheasant, MD as PCP - General (Internal Medicine) Einar Pheasant, MD (Internal Medicine) Bary Castilla, Forest Gleason, MD (General Surgery) De Hollingshead, RPH-CPP (Pharmacist)   Objective:  Lab Results  Component Value Date   CREATININE 0.82 03/05/2021   CREATININE 0.88 11/05/2020   CREATININE 0.82 07/02/2020    Lab Results  Component Value Date   HGBA1C 6.0 03/05/2021   Last diabetic Eye exam: No results found for: HMDIABEYEEXA  Last diabetic Foot exam: No results found for: HMDIABFOOTEX      Component Value Date/Time   CHOL 241 (H) 03/05/2021 1021   TRIG 205.0 (H) 03/05/2021 1021   HDL 50.30 03/05/2021 1021   CHOLHDL 5 03/05/2021 1021   VLDL 41.0 (H) 03/05/2021 1021   LDLCALC 134 (H) 11/05/2020 1028   LDLDIRECT 158.0 03/05/2021 1021    Hepatic Function Latest Ref Rng & Units 03/05/2021 11/05/2020 07/02/2020  Total Protein 6.0 - 8.3 g/dL 6.8 6.8 6.7  Albumin 3.5 - 5.2 g/dL 4.2 4.1 4.1  AST 0 - 37 U/L '19 16 16  ' ALT 0 - 35 U/L '10 6 9  ' Alk Phosphatase 39 - 117 U/L 105 84 102  Total Bilirubin 0.2 - 1.2 mg/dL 0.5 0.5 0.7  Bilirubin, Direct 0.0 - 0.3 mg/dL 0.1 0.1 0.1    Lab Results  Component Value Date/Time   TSH 1.64 03/05/2021 10:21 AM   TSH  1.18 03/31/2020 10:06 AM    CBC Latest Ref Rng & Units 03/05/2021 03/31/2020 02/07/2019  WBC 4.0 - 10.5 K/uL 11.0(H) 9.1 6.7  Hemoglobin 12.0 - 15.0 g/dL 13.6 14.0 13.4  Hematocrit 36.0 - 46.0 % 40.6 41.9 39.2  Platelets 150.0 - 400.0 K/uL 299.0 360.0 320.0    Lab Results  Component Value Date/Time   VD25OH 21.26 (L) 12/21/2019 03:43 PM    Clinical ASCVD: No  The 10-year ASCVD risk score Mikey Bussing DC Jr., et al., 2013) is: 5.3%   Values used to calculate the score:     Age: 25 years     Sex: Female     Is Non-Hispanic African American: No     Diabetic: No     Tobacco smoker: No     Systolic Blood Pressure: 410 mmHg     Is BP treated: Yes     HDL Cholesterol: 50.3 mg/dL     Total Cholesterol: 241 mg/dL      Social History   Tobacco Use  Smoking Status Former   Packs/day: 1.00   Years: 37.00   Pack years: 37.00   Types: Cigarettes   Start date: 08/07/2019   Quit date: 05/25/2021   Years since quitting: 0.0  Smokeless Tobacco Never   BP Readings from Last 3 Encounters:  05/06/21 100/60  03/09/21 128/72  11/07/20 128/70   Pulse Readings  from Last 3 Encounters:  05/06/21 94  03/09/21 75  11/07/20 94   Wt Readings from Last 3 Encounters:  03/09/21 170 lb (77.1 kg)  02/11/21 165 lb (74.8 kg)  11/07/20 169 lb (76.7 kg)    Assessment: Review of patient past medical history, allergies, medications, health status, including review of consultants reports, laboratory and other test data, was performed as part of comprehensive evaluation and provision of chronic care management services.   SDOH:  (Social Determinants of Health) assessments and interventions performed:  SDOH Interventions    Flowsheet Row Most Recent Value  SDOH Interventions   Financial Strain Interventions Other (Comment)  [place] funding]       [place]  Allergies  Allergen Reactions   Gadolinium Nausea And Vomiting   Levaquin [Levofloxacin In D5w] Other (See Comments)     Muscle pain   Adhesive [Tape] Other (See Comments)    Pulls skin, anything that does not pull is recommended    Doxycycline Nausea And Vomiting   Ivp Dye [Iodinated Diagnostic Agents] Nausea And Vomiting and Other (See Comments)     Eyes turned blood red    Medications Reviewed Today     Reviewed by De Hollingshead, RPH-CPP (Pharmacist) on 06/16/21 at 1523  Med List Status: <None>   Medication Order Taking? Sig Documenting Provider Last Dose Status Informant  albuterol (VENTOLIN HFA) 108 (90 Base) MCG/ACT inhaler [id number] No Inhale 1-2 puffs into the lungs every 4 (four) hours as needed.  Patient not taking: Reported on 06/16/2021   [provider] Not Taking Active   Alirocumab (PRALUENT) 75 MG/ML SOAJ [id number] Yes Inject 75 mg into the skin every 14 (fourteen) days. Einar Pheasant, MD Taking Active   aspirin EC 81 MG tablet [id number] Yes Take 1 tablet (81 mg total) by mouth daily. Swallow whole. Einar Pheasant, MD Taking Active   ezetimibe (ZETIA) 10 MG tablet [id number] Yes Take 1 tablet (10 mg total) by mouth daily. Einar Pheasant, MD Taking Active   FLUoxetine (PROZAC) 40 MG capsule [id number] Yes Take 1 capsule (40 mg total) by mouth daily. Einar Pheasant, MD Taking Active   gabapentin (NEURONTIN) 300 MG capsule [id number] Yes Take 2 capsules q hs Einar Pheasant, MD Taking Active   hydrochlorothiazide (MICROZIDE) 12.5 MG capsule [id number] Yes Take 1 capsule (12.5 mg total) by mouth daily. Einar Pheasant, MD Taking Active   HYDROcodone-acetaminophen [facility]) 7.5-325 MG tablet [id number] Yes Take 1.5 tablets by mouth as needed. Using 1-2 times daily [provider] Taking Active   meloxicam (MOBIC) 15 MG tablet [id number] Yes Take 1 tablet (15 mg total) by mouth daily. Einar Pheasant, MD Taking Active   pantoprazole (PROTONIX) 40 MG tablet [id number] Yes Take 1 tablet (40 mg total) by mouth daily. Einar Pheasant, MD Taking Active   potassium chloride (KLOR-CON)  10 MEQ tablet [id number] Yes Take 1 tablet (10 mEq total) by mouth daily. Einar Pheasant, MD Taking Active   pravastatin (PRAVACHOL) 10 MG tablet [id number] Yes Take 1 tablet (10 mg total) by mouth daily. Einar Pheasant, MD Taking Active   tiZANidine (ZANAFLEX) 4 MG tablet [id number] No Take 2-4 mg by mouth 3 times daily as needed for muscle spasms  Patient not taking: Reported on 06/16/2021   [provider] Not Taking Active Self            Patient Active Problem List   Diagnosis Date Noted   Leg pain 05/13/2021   Double vision 03/14/2021  Female bladder prolapse 03/14/2021   Tinnitus 07/05/2020   Chronic left sacroiliac pain 05/21/2020   Chronic pain of left knee 05/21/2020   Bilateral hip pain 05/21/2020   Left elbow pain 05/21/2020   Fatigue 12/30/2019   Weight loss counseling, encounter for 12/30/2019   Breast cancer screening 07/07/2019   Aortic atherosclerosis ([place]) 02/10/2019   GERD (gastroesophageal reflux disease) 06/20/2018   Chest pain 01/06/2018   Lumbar stenosis with neurogenic claudication 09/15/2017   BMI 31.0-31.9,adult 04/25/2017   Hyperglycemia 04/25/2017   Personal history of tobacco use, presenting hazards to health 12/15/2016   Nausea without vomiting 03/29/2016   Personal history of colonic polyps    Anal fissure    Benign neoplasm of sigmoid colon    Left arm pain 08/03/2015   Cough 07/30/2015   Urinary hesitancy 07/30/2015   Health care maintenance 01/12/2015   Rectal irritation 11/24/2014   Gallstones 04/01/2014   Abdominal fullness 03/10/2014   Numbness and tingling 10/02/2013   Hypertension 10/02/2013   Chronic back pain 07/18/2013   Depression 07/18/2013   Anxiety 07/18/2013   Panic attacks 07/18/2013   Environmental allergies 07/18/2013   Hypercholesterolemia 07/18/2013   History of colonic polyps 07/18/2013    Immunization History  Administered Date(s) Administered   Influenza,inj,Quad PF,6+ Mos 09/05/2013, 08/05/2014,  08/20/2015, 08/23/2019   Influenza-Unspecified 09/11/2016, 09/03/2017, 08/21/2018, 11/07/2020   Tdap 06/15/2018    Conditions to be addressed/monitored: HLD, Anxiety, and Depression  Care Plan : Medication Management  Updates made by De Hollingshead, RPH-CPP since 06/16/2021 12:00 AM     Problem: HLD, Depression, Anxiety, Chronic Pain      Long-Range Goal: Disease Progression Prevention   Start Date: 04/10/2021  This Visit's Progress: On track  Recent Progress: On track  Priority: High  Note:   Current Barriers:  Unable to achieve control of cholesterol   Pharmacist Clinical Goal(s):  Over the next 90 days, patient will achieve control of cholesterol through collaboration with PharmD and provider.   Interventions: 1:1 collaboration with Einar Pheasant, MD regarding development and update of comprehensive plan of care as evidenced by provider attestation and co-signature Inter-disciplinary care team collaboration (see longitudinal plan of care) Comprehensive medication review performed; medication list updated in electronic medical record   Health Maintenance: Reports she has QUIT SMOKING about 3 weeks ago. Started with nicotine patches, but has stopped these as they were worsening her insomnia. Praised for this. Encouraged to let us know if she needs anything in the meantime.   SDOH: Continues to struggle with stress, prognosis, husband's health.  Hyperlipidemia, ASCVD risk reduction: Uncontrolled but anticipated to be improved; current treatment: pravastatin 10 mg daily, ezetimibe 10 mg daily, Praluent 75 mg Q14 days Medications previously tried: pravastatin 20 mg daily - myalgias; atorvastatin, rosuvastatin - myalgias, she does not remember doses and pharmacy records do not go back that far.  Antiplatelet regimen: aspirin 81 mg daily  Denies any side effects or concerns with Praluent. Continue current regimen at this time. If LDL at goal with next lipid panel, recommend  sending 90 day supply to [facility]. If not at goal, increase to 140 mg Q14 days.   Hypertension: Controlled per last office visit; current treatment: HCTZ 12.5 mg daily, potassium 10 mEq daily  Recommended to continue current regimen at this time.   Depression/Anxiety, Chronic Pain, Insomnia Uncontrolled; current treatment: fluoxetine 40 mg daily;  Hx buspirone with no benefit. Discussion last visit w/ Dr. Nicki Reaper about avoiding BZD. Hx duloxetine  Hx trazodone Hx alprazolam - requested to discontinue. Has not slept well since Chronic pain: current regimen: hydrocodone/acetaminophen 7.5/325 mg 1.5 tablet PRN, 0-2 times daily, meloxicam 15 mg daily, gabapentin 600 mg QPM, tizanidine 4 mg PRN, but only uses if needed. Continues to follow w/ Dr. Arnoldo Morale at [facility] Denies caffeine after noon, naps during the day, screen time before bed. Reports one day of improved sleep after increasing gabapentin dose, but only helped for one day.  Recommended to continue current regimen at this time along with collaboration with PCP, neurosurgery. Previously declined LCSW support. Continue to follow.  Patient Goals/Self-Care Activities Over the next 90 days, patient will:  - take medications as prescribed collaborate with provider on medication access solutions  Follow Up Plan: Telephone follow up appointment with care management team member scheduled for: 10 weeks     Medication Assistance: [employer] through 03/31/21-03/30/22  Patient's preferred pharmacy is:  [facility], [address] [place] [address] Phone: [phone] Fax: [place] [address] Phone: [phone] Fax: [phone]   Follow Up:  Patient agrees to Care Plan and Follow-up.  Plan: Telephone follow up appointment with care management team member scheduled for:  ~ 12  weeks  Catie Darnelle Maffucci, PharmD, [place], [place] Clinical Pharmacist [employer] at [employer] [phone]

## 2021-06-18 ENCOUNTER — Other Ambulatory Visit: Payer: Self-pay | Admitting: Internal Medicine

## 2021-07-01 ENCOUNTER — Telehealth: Payer: Self-pay | Admitting: Internal Medicine

## 2021-07-01 DIAGNOSIS — H9201 Otalgia, right ear: Secondary | ICD-10-CM

## 2021-07-01 NOTE — Telephone Encounter (Signed)
Patient is having ear pain and would like to know if she needs approval from [place] for a referral to an ENT.

## 2021-07-02 ENCOUNTER — Other Ambulatory Visit (HOSPITAL_COMMUNITY): Payer: Self-pay

## 2021-07-02 NOTE — Telephone Encounter (Signed)
Order placed for ENT referral.   

## 2021-07-02 NOTE — Telephone Encounter (Signed)
Patient says that she has been having some right ear discomfort for the last few weeks and would like to see ENT. Are you okay with referring her to [facility]?

## 2021-07-03 NOTE — Telephone Encounter (Signed)
Patient aware.

## 2021-07-16 ENCOUNTER — Other Ambulatory Visit: Payer: Self-pay | Admitting: Internal Medicine

## 2021-07-24 ENCOUNTER — Other Ambulatory Visit: Payer: Medicare Other

## 2021-07-24 ENCOUNTER — Other Ambulatory Visit: Payer: Self-pay

## 2021-07-24 ENCOUNTER — Other Ambulatory Visit (INDEPENDENT_AMBULATORY_CARE_PROVIDER_SITE_OTHER): Payer: Medicare Other

## 2021-07-24 DIAGNOSIS — E7800 Pure hypercholesterolemia, unspecified: Secondary | ICD-10-CM

## 2021-07-24 DIAGNOSIS — I10 Essential (primary) hypertension: Secondary | ICD-10-CM | POA: Diagnosis not present

## 2021-07-24 DIAGNOSIS — R739 Hyperglycemia, unspecified: Secondary | ICD-10-CM

## 2021-07-24 DIAGNOSIS — D72829 Elevated white blood cell count, unspecified: Secondary | ICD-10-CM

## 2021-07-24 DIAGNOSIS — E538 Deficiency of other specified B group vitamins: Secondary | ICD-10-CM

## 2021-07-24 LAB — BASIC METABOLIC PANEL
BUN: 14 mg/dL (ref 6–23)
CO2: 27 mEq/L (ref 19–32)
Calcium: 9.4 mg/dL (ref 8.4–10.5)
Chloride: 104 mEq/L (ref 96–112)
Creatinine, Ser: 0.84 mg/dL (ref 0.40–1.20)
GFR: 72.9 mL/min (ref >60.00)
Glucose, Bld: 86 mg/dL (ref 70–99)
Potassium: 4.1 mEq/L (ref 3.5–5.1)
Sodium: 141 mEq/L (ref 135–145)

## 2021-07-24 LAB — CBC WITH DIFFERENTIAL/PLATELET
Basophils Absolute: 0 10*3/uL (ref 0.0–0.1)
Basophils Relative: 0.3 % (ref 0.0–3.0)
Eosinophils Absolute: 0.1 10*3/uL (ref 0.0–0.7)
Eosinophils Relative: 1.2 % (ref 0.0–5.0)
HCT: 41.2 % (ref 36.0–46.0)
Hemoglobin: 13.6 g/dL (ref 12.0–15.0)
Lymphocytes Relative: 31.2 % (ref 12.0–46.0)
Lymphs Abs: 2.6 10*3/uL (ref 0.7–4.0)
MCHC: 33 g/dL (ref 30.0–36.0)
MCV: 86.8 fl (ref 78.0–100.0)
Monocytes Absolute: 0.6 10*3/uL (ref 0.1–1.0)
Monocytes Relative: 7.4 % (ref 3.0–12.0)
Neutro Abs: 5.1 10*3/uL (ref 1.4–7.7)
Neutrophils Relative %: 59.9 % (ref 43.0–77.0)
Platelets: 309 10*3/uL (ref 150.0–400.0)
RBC: 4.75 Mil/uL (ref 3.87–5.11)
RDW: 13.6 % (ref 11.5–15.5)
WBC: 8.5 10*3/uL (ref 4.0–10.5)

## 2021-07-24 LAB — HEPATIC FUNCTION PANEL
ALT: 7 U/L (ref 0–35)
AST: 17 U/L (ref 0–37)
Albumin: 4 g/dL (ref 3.5–5.2)
Alkaline Phosphatase: 90 U/L (ref 39–117)
Bilirubin, Direct: 0.1 mg/dL (ref 0.0–0.3)
Total Bilirubin: 0.5 mg/dL (ref 0.2–1.2)
Total Protein: 6.5 g/dL (ref 6.0–8.3)

## 2021-07-24 LAB — LIPID PANEL
Cholesterol: 134 mg/dL (ref 0–200)
HDL: 57 mg/dL (ref >39.00)
LDL Cholesterol: 42 mg/dL (ref 0–99)
NonHDL: 77.39
Total CHOL/HDL Ratio: 2
Triglycerides: 179 mg/dL — ABNORMAL HIGH (ref 0.0–149.0)
VLDL: 35.8 mg/dL (ref 0.0–40.0)

## 2021-07-24 LAB — VITAMIN B12: Vitamin B-12: 154 pg/mL — ABNORMAL LOW (ref 211–911)

## 2021-07-24 LAB — HEMOGLOBIN A1C: Hgb A1c MFr Bld: 6.1 % (ref 4.6–6.5)

## 2021-07-27 ENCOUNTER — Other Ambulatory Visit (HOSPITAL_COMMUNITY): Payer: Self-pay

## 2021-07-27 ENCOUNTER — Ambulatory Visit: Payer: Medicare Other | Admitting: Internal Medicine

## 2021-07-27 ENCOUNTER — Other Ambulatory Visit: Payer: Self-pay | Admitting: Internal Medicine

## 2021-07-27 DIAGNOSIS — E7800 Pure hypercholesterolemia, unspecified: Secondary | ICD-10-CM

## 2021-07-27 MED ORDER — PRALUENT 75 MG/ML ~~LOC~~ SOAJ
75.0000 mg | SUBCUTANEOUS | 2 refills | Status: DC
  Filled 2021-07-27: qty 2, 28d supply, fill #0
  Filled 2021-08-31: qty 2, 28d supply, fill #1

## 2021-07-28 ENCOUNTER — Ambulatory Visit (INDEPENDENT_AMBULATORY_CARE_PROVIDER_SITE_OTHER): Payer: Medicare Other

## 2021-07-28 ENCOUNTER — Other Ambulatory Visit: Payer: Self-pay

## 2021-07-28 DIAGNOSIS — E538 Deficiency of other specified B group vitamins: Secondary | ICD-10-CM | POA: Diagnosis not present

## 2021-07-28 MED ORDER — CYANOCOBALAMIN 1000 MCG/ML IJ SOLN
1000.0000 ug | Freq: Once | INTRAMUSCULAR | Status: AC
  Administered 2021-07-28: 1000 ug via INTRAMUSCULAR

## 2021-07-28 NOTE — Progress Notes (Signed)
Patient presented for B 12 injection to right deltoid, patient voiced no concerns nor showed any signs of distress during injection. 

## 2021-08-04 ENCOUNTER — Other Ambulatory Visit: Payer: Self-pay

## 2021-08-04 ENCOUNTER — Ambulatory Visit (INDEPENDENT_AMBULATORY_CARE_PROVIDER_SITE_OTHER): Payer: Medicare Other

## 2021-08-04 DIAGNOSIS — E538 Deficiency of other specified B group vitamins: Secondary | ICD-10-CM | POA: Diagnosis not present

## 2021-08-04 MED ORDER — CYANOCOBALAMIN 1000 MCG/ML IJ SOLN
1000.0000 ug | Freq: Once | INTRAMUSCULAR | Status: AC
  Administered 2021-08-04: 1000 ug via INTRAMUSCULAR

## 2021-08-04 NOTE — Progress Notes (Signed)
Patient presented for B 12 injection to right deltoid, patient voiced no concerns nor showed any signs of distress during injection. 

## 2021-08-10 ENCOUNTER — Other Ambulatory Visit: Payer: Self-pay | Admitting: Internal Medicine

## 2021-08-11 ENCOUNTER — Ambulatory Visit (INDEPENDENT_AMBULATORY_CARE_PROVIDER_SITE_OTHER): Payer: Medicare Other | Admitting: Internal Medicine

## 2021-08-11 ENCOUNTER — Ambulatory Visit: Payer: Medicare Other

## 2021-08-11 ENCOUNTER — Encounter: Payer: Self-pay | Admitting: Internal Medicine

## 2021-08-11 ENCOUNTER — Other Ambulatory Visit: Payer: Self-pay

## 2021-08-11 VITALS — BP 114/70 | HR 100 | Temp 97.6°F | Resp 16 | Ht 65.0 in | Wt 176.0 lb

## 2021-08-11 DIAGNOSIS — R739 Hyperglycemia, unspecified: Secondary | ICD-10-CM

## 2021-08-11 DIAGNOSIS — Z86010 Personal history of colonic polyps: Secondary | ICD-10-CM

## 2021-08-11 DIAGNOSIS — E7800 Pure hypercholesterolemia, unspecified: Secondary | ICD-10-CM

## 2021-08-11 DIAGNOSIS — I700 Atherosclerosis of aorta: Secondary | ICD-10-CM | POA: Diagnosis not present

## 2021-08-11 DIAGNOSIS — F32A Depression, unspecified: Secondary | ICD-10-CM

## 2021-08-11 DIAGNOSIS — Z23 Encounter for immunization: Secondary | ICD-10-CM

## 2021-08-11 DIAGNOSIS — K219 Gastro-esophageal reflux disease without esophagitis: Secondary | ICD-10-CM | POA: Diagnosis not present

## 2021-08-11 DIAGNOSIS — I10 Essential (primary) hypertension: Secondary | ICD-10-CM | POA: Diagnosis not present

## 2021-08-11 DIAGNOSIS — E2839 Other primary ovarian failure: Secondary | ICD-10-CM

## 2021-08-11 NOTE — Progress Notes (Signed)
Patient ID: Angel Riddle, female   DOB: [birth year], 65 y.o.   MRN: [id number]   Subjective:    Patient ID: Angel Riddle, female    DOB: May 04, 1956, 65 y.o.   MRN: [id number]  This visit occurred during the SARS-CoV-2 public health emergency.  Safety protocols were in place, including screening questions prior to the visit, additional usage of staff PPE, and extensive cleaning of exam room while observing appropriate contact time as indicated for disinfecting solutions.   Patient here for a scheduled follow up.    HPI Here to follow up regarding her blood pressure, cholesterol and increased stress.  [facility] tolerated.  Has chronic back issues.  Also chronic sleep issues.  Off gabapentin.  Did not feel helped.  Has tried other sleeping medication.  Feels xanax is the only thing that has helped.  Discussed sleep issues and possible sleep apnea, etc.  Discussed seeing a sleep specialist.  Increased stress with her husband's health issues.  No chest pain or increased sob reported.  No abdominal pain or bowel change reported.     Past Medical History:  Diagnosis Date   Allergy    Anxiety    Arthritis    Cancer ([place])    basal cell nose   Chronic back pain    degenerative disc disease   Degenerative disc disease, lumbar    Depression    Dyspnea    with exertion / pain   Gallstones    GERD (gastroesophageal reflux disease)    Heart murmur    followed by PCP   Hx of colonic polyp    Hx: UTI (urinary tract infection)    Hypercholesterolemia    Hypertension    Panic attacks    H/O   Wears dentures    partial upper and lower   Past Surgical History:  Procedure Laterality Date   ABDOMINAL HYSTERECTOMY  1992   partial   BACK SURGERY     lumbar fusion   CATARACT EXTRACTION Bilateral 2010,2011   CHOLECYSTECTOMY  04-19-14   COLONOSCOPY  07/02/11   DR. Byrnett   COLONOSCOPY WITH PROPOFOL N/A 09/25/2015   Procedure: COLONOSCOPY WITH PROPOFOL;   Surgeon: Lucilla Lame, MD;  Location: [facility];  Service: Endoscopy;  Laterality: N/A;   FOOT SURGERY Right 2009   MOUTH SURGERY     POLYPECTOMY  09/25/2015   Procedure: POLYPECTOMY;  Surgeon: Lucilla Lame, MD;  Location: [facility];  Service: Endoscopy;;   skin surgery Left 08/27/13   Basal cell removal-left nostril   TONSILLECTOMY AND ADENOIDECTOMY  1969   Family History  Problem Relation Age of Onset   Hyperlipidemia Mother    Hypertension Mother    Alcohol abuse Father    Hyperlipidemia Father    Heart disease Father    Diabetes Father    Hyperlipidemia Sister    Lung cancer Brother    Hyperlipidemia Brother    Breast cancer Neg Hx    Social History   Socioeconomic History   Marital status: Married    Spouse name: Not on file   Number of children: 2   Years of education: Not on file   Highest education level: Not on file  Occupational History   Not on file  Tobacco Use   Smoking status: Former    Packs/day: 1.00    Years: 37.00    Pack years: 37.00    Types: Cigarettes    Start  date: 08/07/2019    Quit date: 05/25/2021    Years since quitting: 0.2   Smokeless tobacco: Never  Vaping Use   Vaping Use: Former  Substance and Sexual Activity   Alcohol use: Yes    Alcohol/week: 1.0 standard drink    Types: 1 Glasses of wine per week   Drug use: No   Sexual activity: Not on file  Other Topics Concern   Not on file  Social History Narrative   Not on file   Social Determinants of Health   Financial Resource Strain: Medium Risk   Difficulty of Paying Living Expenses: Somewhat hard  Food Insecurity: Not on file  Transportation Needs: Not on file  Physical Activity: Not on file  Stress: Stress Concern Present   Feeling of Stress : Rather much  Social Connections: Not on file    Review of Systems  Constitutional:  Negative for appetite change and unexpected weight change.  HENT:  Negative for congestion.   Respiratory:  Negative for cough,  chest tightness and shortness of breath.   Cardiovascular:  Negative for chest pain, palpitations and leg swelling.  Gastrointestinal:  Negative for abdominal pain, diarrhea, nausea and vomiting.  Genitourinary:  Negative for difficulty urinating and dysuria.  Musculoskeletal:  Negative for myalgias.       Chronic back pain.   Skin:  Negative for color change and rash.  Neurological:  Negative for dizziness, light-headedness and headaches.  Psychiatric/Behavioral:  Positive for sleep disturbance. Negative for agitation.       Objective:     BP 114/70   Pulse 100   Temp 97.6 F (36.4 C)   Resp 16   Ht '5\' 5"'  (1.651 m)   Wt 176 lb (79.8 kg)   SpO2 97%   BMI 29.29 kg/m  Wt Readings from Last 3 Encounters:  08/11/21 176 lb (79.8 kg)  03/09/21 170 lb (77.1 kg)  02/11/21 165 lb (74.8 kg)    Physical Exam Vitals reviewed.  Constitutional:      General: She is not in acute distress.    Appearance: Normal appearance.  HENT:     Head: Normocephalic and atraumatic.     Right Ear: External ear normal.     Left Ear: External ear normal.  Eyes:     General: No scleral icterus.       Right eye: No discharge.        Left eye: No discharge.     Conjunctiva/sclera: Conjunctivae normal.  Neck:     Thyroid: No thyromegaly.  Cardiovascular:     Rate and Rhythm: Normal rate and regular rhythm.  Pulmonary:     Effort: No respiratory distress.     Breath sounds: Normal breath sounds. No wheezing.  Abdominal:     General: Bowel sounds are normal.     Palpations: Abdomen is soft.     Tenderness: There is no abdominal tenderness.  Musculoskeletal:        General: No swelling or tenderness.     Cervical back: Neck supple. No tenderness.  Lymphadenopathy:     Cervical: No cervical adenopathy.  Skin:    Findings: No erythema or rash.  Neurological:     Mental Status: She is alert.  Psychiatric:        Mood and Affect: Mood normal.        Behavior: Behavior normal.      Outpatient Encounter Medications as of 08/11/2021  Medication Sig   Alirocumab (PRALUENT) 75 MG/ML SOAJ Inject  1 pen (75 mg) into the skin every 14 days.   aspirin EC 81 MG tablet Take 1 tablet (81 mg total) by mouth daily. Swallow whole.   ezetimibe (ZETIA) 10 MG tablet Take 1 tablet (10 mg total) by mouth daily.   FLUoxetine (PROZAC) 40 MG capsule Take 1 capsule (40 mg total) by mouth daily.   hydrochlorothiazide (MICROZIDE) 12.5 MG capsule Take 1 capsule (12.5 mg total) by mouth daily.   HYDROcodone-acetaminophen (NORCO) 7.5-325 MG tablet Take 1.5 tablets by mouth as needed. Using 1-2 times daily   meloxicam (MOBIC) 15 MG tablet Take 1 tablet (15 mg total) by mouth daily.   pantoprazole (PROTONIX) 40 MG tablet Take 1 tablet (40 mg total) by mouth daily.   potassium chloride (KLOR-CON) 10 MEQ tablet Take 1 tablet (10 mEq total) by mouth daily.   pravastatin (PRAVACHOL) 10 MG tablet Take 1 tablet (10 mg total) by mouth daily.   [DISCONTINUED] albuterol (VENTOLIN HFA) 108 (90 Base) MCG/ACT inhaler Inhale 1-2 puffs into the lungs every 4 (four) hours as needed. (Patient not taking: Reported on 06/16/2021)   [DISCONTINUED] gabapentin (NEURONTIN) 300 MG capsule Take 2 capsules q hs   [DISCONTINUED] tiZANidine (ZANAFLEX) 4 MG tablet Take 2-4 mg by mouth 3 times daily as needed for muscle spasms (Patient not taking: Reported on 06/16/2021)   No facility-administered encounter medications on file as of 08/11/2021.     Lab Results  Component Value Date   WBC 8.5 07/24/2021   HGB 13.6 07/24/2021   HCT 41.2 07/24/2021   PLT 309.0 07/24/2021   GLUCOSE 86 07/24/2021   CHOL 134 07/24/2021   TRIG 179.0 (H) 07/24/2021   HDL 57.00 07/24/2021   LDLDIRECT 158.0 03/05/2021   LDLCALC 42 07/24/2021   ALT 7 07/24/2021   AST 17 07/24/2021   NA 141 07/24/2021   K 4.1 07/24/2021   CL 104 07/24/2021   CREATININE 0.84 07/24/2021   BUN 14 07/24/2021   CO2 27 07/24/2021   TSH 1.64 03/05/2021   HGBA1C  6.1 07/24/2021    MR Brain W Wo Contrast  Result Date: 04/07/2021 CLINICAL DATA:  [facility]. EXAM: MRI HEAD WITHOUT AND WITH CONTRAST TECHNIQUE: Multiplanar, multiecho pulse sequences of the brain and surrounding structures were obtained without and with intravenous contrast. CONTRAST:  7.74m GADAVIST GADOBUTROL 1 MMOL/ML IV SOLN COMPARISON:  Head CT July 11, 2018.  MRI of the brain Apr 30, 2010. FINDINGS: Brain: No acute infarction, hemorrhage, hydrocephalus, extra-axial collection or mass lesion. Few scattered foci of T2 hyperintensity are seen within the white matter of the cerebral hemispheres, nonspecific. No focus of abnormal contrast enhancement. Vascular: Normal flow voids. Skull and upper cervical spine: Normal marrow signal. Sinuses/Orbits: Bilateral lens surgery. Paranasal sinuses are clear. Other: Partial empty sella, unchanged since 2011. IMPRESSION: Mild chronic white matter ischemic changes. Electronically Signed   By: KPedro EarlsM.D.   On: 04/07/2021 16:45       Assessment & Plan:   Problem List Items Addressed This Visit     Aortic atherosclerosis ([place]    Low cholesterol diet and exercise.  Continue zetia and pravastatin.  Seeing Catie - Praluent.  Follow lipid panel and liver function tests.        Depression    Continue prozac.  Off xanax.  Stopped gabapentin.  Discussed. Desires no further intervention.  Follow.       GERD (gastroesophageal reflux disease)    No upper symptoms reported.  Continue prilosec.  History of colonic polyps    Colonoscopy 09/2015 - sigmoid polyp and fissure, non bleeding internal hemorrhoids.  Recommended f/u in 5 years. Once above issues sorted through - needs f/u. Discussed.  Agreeable for referral.  Request Dr Bary Castilla.       Relevant Orders   Ambulatory referral to General Surgery   Hypercholesterolemia    Low cholesterol diet and exercise.  Continue zetia and pravastatin.  Seeing Catie - praluent. Follow lipid  panel and liver function tests.   Lab Results  Component Value Date   HGBA1C 6.1 07/24/2021       Relevant Orders   Hepatic function panel   Lipid panel   TSH   Hyperglycemia    Low carb diet and exercise.  Follow met b and a1c.  Lab Results  Component Value Date   HGBA1C 6.1 07/24/2021       Relevant Orders   Hemoglobin A1c   Hypertension    Continue hctz.  Blood pressure doing well.  Follow pressures.  Follow metabolic panel.       Relevant Orders   Basic metabolic panel   Other Visit Diagnoses     Need for immunization against influenza    -  Primary   Relevant Orders   Flu Vaccine QUAD High Dose(Fluad) (Completed)   Estrogen deficiency       Relevant Orders   DG Bone Density        Einar Pheasant, MD

## 2021-08-15 ENCOUNTER — Other Ambulatory Visit: Payer: Self-pay | Admitting: Internal Medicine

## 2021-08-16 ENCOUNTER — Encounter: Payer: Self-pay | Admitting: Internal Medicine

## 2021-08-16 NOTE — Assessment & Plan Note (Signed)
Colonoscopy 09/2015 - sigmoid polyp and fissure, non bleeding internal hemorrhoids.  Recommended f/u in 5 years. Once above issues sorted through - needs f/u. Discussed.  Agreeable for referral.  Request Dr Bary Castilla.

## 2021-08-16 NOTE — Assessment & Plan Note (Addendum)
Low cholesterol diet and exercise.  Continue zetia and pravastatin.  Seeing Catie - praluent. Follow lipid panel and liver function tests.   Lab Results  Component Value Date   HGBA1C 6.1 07/24/2021

## 2021-08-16 NOTE — Assessment & Plan Note (Signed)
Low carb diet and exercise.  Follow met b and a1c.  Lab Results  Component Value Date   HGBA1C 6.1 07/24/2021

## 2021-08-16 NOTE — Assessment & Plan Note (Signed)
No upper symptoms reported.  Continue prilosec.  

## 2021-08-16 NOTE — Assessment & Plan Note (Signed)
Continue prozac.  Off xanax.  Stopped gabapentin.  Discussed. Desires no further intervention.  Follow.

## 2021-08-16 NOTE — Assessment & Plan Note (Signed)
Low cholesterol diet and exercise.  Continue zetia and pravastatin.  Seeing Catie - Praluent.  Follow lipid panel and liver function tests.

## 2021-08-16 NOTE — Assessment & Plan Note (Signed)
Continue hctz.  Blood pressure doing well.  Follow pressures.  Follow metabolic panel.  

## 2021-08-17 ENCOUNTER — Telehealth: Payer: Self-pay | Admitting: Internal Medicine

## 2021-08-17 NOTE — Telephone Encounter (Signed)
Patient informed, Due to the high volume of calls and your symptoms we have to forward your call to our Triage Nurse to expedient your call. Please hold for the transfer.  Patient transferred to Access Nurse. Due to possibly having a UTI and she has taken Azo for this but is not working.She would like to know if Dr.Scott can send in a medicine for her,no openings in office or virtual.

## 2021-08-17 NOTE — Telephone Encounter (Signed)
Unable to leave message for patient to return call back.VM box has not been set up yet.

## 2021-08-17 NOTE — Telephone Encounter (Signed)
Patient is having UTI sx of urine frequency and painful uriniation. Patient has taken azo but has not helped. Access nurse stated to see PCP within 24 hours and provided homecare and instructed if sx worsen go to UC/ED. Patient has seen PCP within the past week.

## 2021-08-17 NOTE — Telephone Encounter (Signed)
Patient refused urine test for today. She stated she is coming in for B12 and will give reply then. She stated the azo she got previous was the wrong one/type. Sop she bought another one today and is going to see if that will work first. No nausea or vomiting.

## 2021-08-17 NOTE — Telephone Encounter (Signed)
Confirm no vomiting or nausea.  Can she come in and give a urine sample today and schedule virtual visit tomorrow am.

## 2021-08-18 ENCOUNTER — Ambulatory Visit (INDEPENDENT_AMBULATORY_CARE_PROVIDER_SITE_OTHER): Payer: Medicare Other

## 2021-08-18 ENCOUNTER — Other Ambulatory Visit: Payer: Self-pay

## 2021-08-18 DIAGNOSIS — R300 Dysuria: Secondary | ICD-10-CM

## 2021-08-18 DIAGNOSIS — E538 Deficiency of other specified B group vitamins: Secondary | ICD-10-CM

## 2021-08-18 MED ORDER — CYANOCOBALAMIN 1000 MCG/ML IJ SOLN
1000.0000 ug | Freq: Once | INTRAMUSCULAR | Status: AC
  Administered 2021-08-18: 1000 ug via INTRAMUSCULAR

## 2021-08-18 NOTE — Progress Notes (Signed)
Patient came in today for B-12 injection in left deltoid IM. Patient tolerated will with no signs of distress.

## 2021-08-19 ENCOUNTER — Telehealth (INDEPENDENT_AMBULATORY_CARE_PROVIDER_SITE_OTHER): Payer: Medicare Other | Admitting: Internal Medicine

## 2021-08-19 DIAGNOSIS — I10 Essential (primary) hypertension: Secondary | ICD-10-CM

## 2021-08-19 DIAGNOSIS — N3000 Acute cystitis without hematuria: Secondary | ICD-10-CM

## 2021-08-19 LAB — URINALYSIS, ROUTINE W REFLEX MICROSCOPIC

## 2021-08-19 MED ORDER — CEPHALEXIN 500 MG PO CAPS: 500.0000 mg | ORAL_CAPSULE | Freq: Two times a day (BID) | ORAL | 0 refills | Status: DC

## 2021-08-19 NOTE — Progress Notes (Signed)
Patient ID: Angel Riddle, female   DOB: [birth year], 65 y.o.   MRN: [id number]   Virtual Visit via telephone Note  This visit type was conducted due to national recommendations for restrictions regarding the COVID-19 pandemic (e.g. social distancing).  This format is felt to be most appropriate for this patient at this time.  All issues noted in this document were discussed and addressed.  No physical exam was performed (except for noted visual exam findings with Video Visits).   I connected with Mikele Falwell today by telephone and verified that I am speaking with the correct person using two identifiers. Location patient: home Location provider: work  Persons participating in the telephone visit: patient, provider  The limitations, risks, security and privacy concerns of performing an evaluation and management service by telephone and the availability of in person appointments have been discussed.  It has also been discussed with the patient that there may be a patient responsible charge related to this service. The patient expressed understanding and agreed to proceed.   Reason for visit: work in appt  HPI: Work in with concerns regarding possible UTI.  States symptoms started Sunday 08/16/21.  Noticed discomfort when finished urinating.  The following day noticed dysuria.  Increased frequency.  No hematuria.  No fever.  No abdominal pain.  Eating and drinking.  Has taken AZO.  For about 24 hours last week she noticed loose bowels.     ROS: See pertinent positives and negatives per HPI.  Past Medical History:  Diagnosis Date   Allergy    Anxiety    Arthritis    Cancer ([place])    basal cell nose   Chronic back pain    degenerative disc disease   Degenerative disc disease, lumbar    Depression    Dyspnea    with exertion / pain   Gallstones    GERD (gastroesophageal reflux disease)    Heart murmur    followed by PCP   Hx of colonic polyp    Hx: UTI (urinary tract infection)     Hypercholesterolemia    Hypertension    Panic attacks    H/O   Wears dentures    partial upper and lower    Past Surgical History:  Procedure Laterality Date   ABDOMINAL HYSTERECTOMY  1992   partial   BACK SURGERY     lumbar fusion   CATARACT EXTRACTION Bilateral 2010,2011   CHOLECYSTECTOMY  04-19-14   COLONOSCOPY  07/02/11   DR. Byrnett   COLONOSCOPY WITH PROPOFOL N/A 09/25/2015   Procedure: COLONOSCOPY WITH PROPOFOL;  Surgeon: Lucilla Lame, MD;  Location: [facility];  Service: Endoscopy;  Laterality: N/A;   FOOT SURGERY Right 2009   MOUTH SURGERY     POLYPECTOMY  09/25/2015   Procedure: POLYPECTOMY;  Surgeon: Lucilla Lame, MD;  Location: [facility];  Service: Endoscopy;;   skin surgery Left 08/27/13   Basal cell removal-left nostril   TONSILLECTOMY AND ADENOIDECTOMY  1969    Family History  Problem Relation Age of Onset   Hyperlipidemia Mother    Hypertension Mother    Alcohol abuse Father    Hyperlipidemia Father    Heart disease Father    Diabetes Father    Hyperlipidemia Sister    Lung cancer Brother    Hyperlipidemia Brother    Breast cancer Neg Hx     SOCIAL HX: Reviewed   Current Outpatient Medications:    Alirocumab (PRALUENT) 75 MG/ML SOAJ, Inject 1 pen (  75 mg) into the skin every 14 days., Disp: 2 mL, Rfl: 2   aspirin EC 81 MG tablet, Take 1 tablet (81 mg total) by mouth daily. Swallow whole., Disp: 30 tablet, Rfl: 0   ezetimibe (ZETIA) 10 MG tablet, Take 1 tablet (10 mg total) by mouth daily., Disp: 90 tablet, Rfl: 0   FLUoxetine (PROZAC) 40 MG capsule, Take 1 capsule (40 mg total) by mouth daily., Disp: 90 capsule, Rfl: 0   hydrochlorothiazide (MICROZIDE) 12.5 MG capsule, Take 1 capsule (12.5 mg total) by mouth daily., Disp: 90 capsule, Rfl: 0   HYDROcodone-acetaminophen (NORCO) 7.5-325 MG tablet, Take 1.5 tablets by mouth as needed. Using 1-2 times daily, Disp: , Rfl:    meloxicam (MOBIC) 15 MG tablet, Take 1 tablet (15 mg total) by  mouth daily., Disp: 90 tablet, Rfl: 0   pantoprazole (PROTONIX) 40 MG tablet, Take 1 tablet (40 mg total) by mouth daily., Disp: 30 tablet, Rfl: 0   potassium chloride (KLOR-CON) 10 MEQ tablet, Take 1 tablet (10 mEq total) by mouth daily., Disp: 30 tablet, Rfl: 0   pravastatin (PRAVACHOL) 10 MG tablet, Take 1 tablet (10 mg total) by mouth daily., Disp: 30 tablet, Rfl: 0   cephALEXin (KEFLEX) 500 MG capsule, Take 1 capsule (500 mg total) by mouth 2 (two) times daily., Disp: 10 capsule, Rfl: 0  EXAM:  GENERAL: alert.  Sounds to be in no acute distress.  Answered questions appropriately.  PSYCH/NEURO: pleasant and cooperative, no obvious depression or anxiety, speech and thought processing grossly intact  ASSESSMENT AND PLAN:  Discussed the following assessment and plan:  Problem List Items Addressed This Visit     Hypertension    Continue hctz.  Follow pressures.        UTI (urinary tract infection)    - Symptoms consistent with UTI -No systemic symptoms or signs of pyelonephritis -Will start treatment with keflex. -We will send urine culture to confirm sensitivities -Discussed return precautions       Return if symptoms worsen or fail to improve.   I discussed the assessment and treatment plan with the patient. The patient was provided an opportunity to ask questions and all were answered. The patient agreed with the plan and demonstrated an understanding of the instructions.   The patient was advised to call back or seek an in-person evaluation if the symptoms worsen or if the condition fails to improve as anticipated.  I provided 23 minutes of non-face-to-face time during this encounter.   Einar Pheasant, MD

## 2021-08-20 LAB — URINE CULTURE
MICRO NUMBER:: 12367008
SPECIMEN QUALITY:: ADEQUATE

## 2021-08-25 ENCOUNTER — Telehealth: Payer: Self-pay | Admitting: Internal Medicine

## 2021-08-25 NOTE — Telephone Encounter (Signed)
Patient calling back in, was treated for a UTI 08/19/21 and states she received a 5 day supply of an antibiotic. Finished these Saturday or Sunday. Patient is still having mild symptoms, like the UTI is trying to return.   Patient has been drinking sugar free cranberry juice, eating probiotic yogurt, but is still having frequency and a slight burn with urination.   Patient wondering if she needs more antibiotics sent in?

## 2021-08-26 ENCOUNTER — Other Ambulatory Visit: Payer: Self-pay

## 2021-08-26 MED ORDER — CEPHALEXIN 500 MG PO CAPS: 500.0000 mg | ORAL_CAPSULE | Freq: Two times a day (BID) | ORAL | 0 refills | Status: DC

## 2021-08-26 NOTE — Telephone Encounter (Signed)
Patient confirmed no new symptoms. She was getting better while on the keflex but feels like it did not clear completely. Same symptoms returned a couple days after completing abx. Ok to send in refill of the keflex?

## 2021-08-26 NOTE — Telephone Encounter (Signed)
Patient calling back in to check on this message

## 2021-08-26 NOTE — Telephone Encounter (Signed)
If she was doing better and just not completely resolved, then [facility] to refill x 1, but if persistent, will need to be reevaluated.

## 2021-08-26 NOTE — Telephone Encounter (Signed)
Rx sent in. Patient is aware.  

## 2021-08-28 ENCOUNTER — Telehealth: Payer: Self-pay | Admitting: Internal Medicine

## 2021-08-28 NOTE — Telephone Encounter (Signed)
Patient called in stating that she would like for Dr.Scott to give her a call at home.She did not specify the reason,please call her at [phone].

## 2021-08-29 ENCOUNTER — Encounter: Payer: Self-pay | Admitting: Internal Medicine

## 2021-08-29 DIAGNOSIS — N390 Urinary tract infection, site not specified: Secondary | ICD-10-CM | POA: Insufficient documentation

## 2021-08-29 NOTE — Assessment & Plan Note (Signed)
-   Symptoms consistent with UTI -No systemic symptoms or signs of pyelonephritis -Will start treatment with keflex. -We will send urine culture to confirm sensitivities -Discussed return precautions

## 2021-08-29 NOTE — Assessment & Plan Note (Signed)
Continue hctz.  Follow pressures.

## 2021-08-31 ENCOUNTER — Other Ambulatory Visit (HOSPITAL_COMMUNITY): Payer: Self-pay

## 2021-08-31 ENCOUNTER — Other Ambulatory Visit: Payer: Self-pay | Admitting: Internal Medicine

## 2021-08-31 ENCOUNTER — Telehealth: Payer: Self-pay

## 2021-08-31 NOTE — Telephone Encounter (Signed)
Patient was calling in to see if she could have something called in for her nerves. She has been hacked and they took $10,000. They are working with the police department to get their money back. She is wanting something to calm her nerves until this is resolved. Advised that you are out of office this PM and some this week. Patient gave verbal understanding. Advised that typically anxiety medication is not called in without a visit or virtual visit. Patient stated she understood and that you could call her anytime. Advised that typically you do not call without a virtual appt. Pt asked that I send message over to you.   Discussed with PCP and scheduled for virtual at 7am.

## 2021-08-31 NOTE — Chronic Care Management (AMB) (Signed)
  Care Management   Note  08/31/2021 Name: Angel Riddle MRN: [id number] DOB: November 26, 1956  Angel Riddle is a 65 y.o. year old female who is a primary care patient of Einar Pheasant, MD and is actively engaged with the care management team. I reached out to [name] by phone today to assist with re-scheduling a follow up visit with the Pharmacist  Follow up plan: The care management team will reach out to the patient again over the next 7 days.  If patient returns call to provider office, please advise to call [facility]  at [place], [address], [place], [place] [address] Direct Dial: [phone] [email or web address]

## 2021-08-31 NOTE — Telephone Encounter (Signed)
Spoke with patients husband, patient was not home. Advised to have pt call back

## 2021-09-01 ENCOUNTER — Telehealth (INDEPENDENT_AMBULATORY_CARE_PROVIDER_SITE_OTHER): Payer: Medicare Other | Admitting: Internal Medicine

## 2021-09-01 DIAGNOSIS — I10 Essential (primary) hypertension: Secondary | ICD-10-CM | POA: Diagnosis not present

## 2021-09-01 DIAGNOSIS — F419 Anxiety disorder, unspecified: Secondary | ICD-10-CM

## 2021-09-01 MED ORDER — ALPRAZOLAM 0.5 MG PO TABS: 0.5000 mg | ORAL_TABLET | Freq: Two times a day (BID) | ORAL | 0 refills | Status: DC | PRN
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
## 2021-09-02 NOTE — Chronic Care Management (AMB) (Signed)
  Care Management   Note  09/02/2021 Name: Angel Riddle MRN: [id number] DOB: 1956-05-24  Smt A Etzkorn is a 65 y.o. year old female who is a primary care patient of Einar Pheasant, MD and is actively engaged with the care management team. I reached out to [place] by phone today to assist with re-scheduling a follow up visit with the Pharmacist  Follow up plan: Unsuccessful telephone outreach attempt made. A HIPAA compliant phone message was left for the patient providing contact information and requesting a return call.  The care management team will reach out to the patient again over the next 5 days.  If patient returns call to provider office, please advise to call [facility]  at [place], [place], [place], [place] [address] Direct Dial: [phone] [email or web address] Website: [email or web address]

## 2021-09-02 NOTE — Telephone Encounter (Signed)
Patient is returning your call from earlier.Please call the patient [phone].

## 2021-09-04 ENCOUNTER — Telehealth: Payer: Self-pay | Admitting: Internal Medicine

## 2021-09-04 NOTE — Telephone Encounter (Signed)
Rejection Reason - Patient was No Show" Angel Riddle said on Sep 03, 2021 10:08 AM  Appt was on 07/29/2021 at 2pm  Msg from [place] ent

## 2021-09-04 NOTE — Chronic Care Management (AMB) (Signed)
  Care Management   Note  09/04/2021 Name: Angel Riddle MRN: [id number] DOB: 02/18/1956  Angel Riddle is a 65 y.o. year old female who is a primary care patient of Einar Pheasant, MD and is actively engaged with the care management team. I reached out to [place] by phone today to assist with re-scheduling a follow up visit with the Pharmacist  Follow up plan: Telephone appointment with care management team member scheduled for:09/21/2021  Noreene Larsson, [address], [place], [place] [address] Direct Dial: [phone] [email or web address] Website: [email or web address]

## 2021-09-06 ENCOUNTER — Encounter: Payer: Self-pay | Admitting: Internal Medicine

## 2021-09-06 NOTE — Assessment & Plan Note (Signed)
Continue hctz.  Follow pressures.

## 2021-09-06 NOTE — Assessment & Plan Note (Signed)
Feels buspar and gabapentin did not help.  Has tried hydroxyzine.  On prozac.  Request xanax refill.  Have discussed with her previously regarding my desire to limit xanax usage.  She has been in agreement, but does feel needs something now to help calm her down.  Xanax rx sent in to use prn for acute issues now - given affecting sleeping, eating, functioning.  Plans to follow with her pastor for counseling.  Refer to psychiatry.

## 2021-09-14 ENCOUNTER — Telehealth: Payer: Medicare Other

## 2021-09-14 ENCOUNTER — Other Ambulatory Visit: Payer: Self-pay | Admitting: Internal Medicine

## 2021-09-17 ENCOUNTER — Telehealth: Payer: Self-pay | Admitting: Internal Medicine

## 2021-09-17 NOTE — Telephone Encounter (Signed)
Patient is returning your call from earlier,please call her at [phone].

## 2021-09-21 ENCOUNTER — Other Ambulatory Visit (HOSPITAL_COMMUNITY): Payer: Self-pay

## 2021-09-21 ENCOUNTER — Ambulatory Visit (INDEPENDENT_AMBULATORY_CARE_PROVIDER_SITE_OTHER): Payer: Medicare Other | Admitting: Pharmacist

## 2021-09-21 DIAGNOSIS — I10 Essential (primary) hypertension: Secondary | ICD-10-CM

## 2021-09-21 DIAGNOSIS — I700 Atherosclerosis of aorta: Secondary | ICD-10-CM

## 2021-09-21 DIAGNOSIS — E7800 Pure hypercholesterolemia, unspecified: Secondary | ICD-10-CM

## 2021-09-21 MED ORDER — PRAVASTATIN SODIUM 10 MG PO TABS: 10.0000 mg | ORAL_TABLET | Freq: Every day | ORAL | 1 refills | Status: DC

## 2021-09-21 MED ORDER — PRALUENT 75 MG/ML ~~LOC~~ SOAJ
75.0000 mg | SUBCUTANEOUS | 1 refills | Status: DC
  Filled 2021-09-21: qty 2, 28d supply, fill #0
  Filled 2021-10-19: qty 2, 28d supply, fill #1
  Filled 2021-11-18: qty 2, 28d supply, fill #2
  Filled 2021-12-15: qty 2, 28d supply, fill #3
  Filled 2022-01-11: qty 2, 28d supply, fill #4
  Filled 2022-02-19: qty 2, 28d supply, fill #5

## 2021-09-21 MED ORDER — HYDROCHLOROTHIAZIDE 12.5 MG PO CAPS
12.5000 mg | ORAL_CAPSULE | Freq: Every day | ORAL | 1 refills | Status: DC
Start: 2021-09-21 — End: 2021-11-23

## 2021-09-21 MED ORDER — POTASSIUM CHLORIDE ER 10 MEQ PO TBCR: 10.0000 meq | EXTENDED_RELEASE_TABLET | Freq: Every day | ORAL | 0 refills | Status: DC

## 2021-09-21 NOTE — Telephone Encounter (Signed)
Did not call patient. Pt had visit with Angel Riddle today

## 2021-09-21 NOTE — Patient Instructions (Signed)
Visit Information  PATIENT GOALS:  Goals Addressed             This Visit's Progress    Medication Monitoring       Patient Goals/Self-Care Activities Over the next 90 days, patient will:  - take medications as prescribed collaborate with provider on medication access solutions        Patient verbalizes understanding of instructions provided today and agrees to view in [place].   Plan: Telephone follow up appointment with care management team member scheduled for:  4 monhs  Catie Darnelle Maffucci, PharmD, [place], [place] Clinical Pharmacist [employer] at [employer] [phone]

## 2021-09-21 NOTE — Chronic Care Management (AMB) (Signed)
Chronic Care Management Pharmacy Note  09/21/2021 Name:  Angel Riddle MRN:  [id number] DOB:  Jan 29, 1956  Subjective: Angel Riddle is an 65 y.o. year old female who is a primary patient of Einar Pheasant, MD.  The CCM team was consulted for assistance with disease management and care coordination needs.    Engaged with patient by telephone for follow up visit in response to provider referral for pharmacy case management and/or care coordination services.   Consent to Services:  The patient was given information about Chronic Care Management services, agreed to services, and gave verbal consent prior to initiation of services.  Please see initial visit note for detailed documentation.   Patient Care Team: Einar Pheasant, MD as PCP - General (Internal Medicine) Einar Pheasant, MD (Internal Medicine) Bary Castilla, Forest Gleason, MD (General Surgery) De Hollingshead, RPH-CPP (Pharmacist)  Objective:  Lab Results  Component Value Date   CREATININE 0.84 07/24/2021   CREATININE 0.82 03/05/2021   CREATININE 0.88 11/05/2020    Lab Results  Component Value Date   HGBA1C 6.1 07/24/2021   Last diabetic Eye exam: No results found for: HMDIABEYEEXA  Last diabetic Foot exam: No results found for: HMDIABFOOTEX      Component Value Date/Time   CHOL 134 07/24/2021 1010   TRIG 179.0 (H) 07/24/2021 1010   HDL 57.00 07/24/2021 1010   CHOLHDL 2 07/24/2021 1010   VLDL 35.8 07/24/2021 1010   LDLCALC 42 07/24/2021 1010   LDLDIRECT 158.0 03/05/2021 1021    Hepatic Function Latest Ref Rng & Units 07/24/2021 03/05/2021 11/05/2020  Total Protein 6.0 - 8.3 g/dL 6.5 6.8 6.8  Albumin 3.5 - 5.2 g/dL 4.0 4.2 4.1  AST 0 - 37 U/L _0 ALT 0 - 35 U/L _1 Alk Phosphatase 39 - 117 U/L 90 105 84  Total Bilirubin 0.2 - 1.2 mg/dL 0.5 0.5 0.5  Bilirubin, Direct 0.0 - 0.3 mg/dL 0.1 0.1 0.1    Lab Results  Component Value Date/Time   TSH 1.64 03/05/2021 10:21 AM   TSH 1.18 03/31/2020  10:06 AM    CBC Latest Ref Rng & Units 07/24/2021 03/05/2021 03/31/2020  WBC 4.0 - 10.5 K/uL 8.5 11.0(H) 9.1  Hemoglobin 12.0 - 15.0 g/dL 13.6 13.6 14.0  Hematocrit 36.0 - 46.0 % 41.2 40.6 41.9  Platelets 150.0 - 400.0 K/uL 309.0 299.0 360.0    Lab Results  Component Value Date/Time   VD25OH 21.26 (L) 12/21/2019 03:43 PM    Social History   Tobacco Use  Smoking Status Former   Packs/day: 1.00   Years: 37.00   Pack years: 37.00   Types: Cigarettes   Start date: 08/07/2019   Quit date: 05/25/2021   Years since quitting: 0.3  Smokeless Tobacco Never   BP Readings from Last 3 Encounters:  09/01/21 (!) 155/80  08/11/21 114/70  05/06/21 100/60   Pulse Readings from Last 3 Encounters:  08/11/21 100  05/06/21 94  03/09/21 75   Wt Readings from Last 3 Encounters:  09/01/21 176 lb (79.8 kg)  08/19/21 176 lb (79.8 kg)  08/11/21 176 lb (79.8 kg)    Assessment: Review of patient past medical history, allergies, medications, health status, including review of consultants reports, laboratory and other test data, was performed as part of comprehensive evaluation and provision of chronic care management services.   SDOH:  (Social Determinants of Health) assessments and interventions performed:  SDOH Interventions    Flowsheet Row Most Recent Value  SDOH Interventions   Financial Strain Interventions Other (Comment)  [[employer] funding]       CCM Care Plan  Allergies  Allergen Reactions   Gadolinium Nausea And Vomiting   Levaquin [Levofloxacin In D5w] Other (See Comments)    Muscle pain   Adhesive [Tape] Other (See Comments)    Pulls skin, anything that does not pull is recommended    Doxycycline Nausea And Vomiting   Ivp Dye [Iodinated Diagnostic Agents] Nausea And Vomiting and Other (See Comments)     Eyes turned blood red    Medications Reviewed Today     Reviewed by De Hollingshead, RPH-CPP (Pharmacist) on 09/21/21 at 1534  Med List Status: <None>    Medication Order Taking? Sig Documenting Provider Last Dose Status Informant  Alirocumab (PRALUENT) 75 MG/ML SOAJ [id number] Yes Inject 1 pen (75 mg) into the skin every 14 days. Einar Pheasant, MD Taking Active   ALPRAZolam Duanne Moron) 0.5 MG tablet [id number] Yes Take 1 tablet (0.5 mg total) by mouth 2 (two) times daily as needed for anxiety. Einar Pheasant, MD Taking Active   aspirin EC 81 MG tablet [id number] Yes Take 1 tablet (81 mg total) by mouth daily. Swallow whole. Einar Pheasant, MD Taking Active   ezetimibe (ZETIA) 10 MG tablet [id number] Yes Take 1 tablet (10 mg total) by mouth daily. Einar Pheasant, MD Taking Active   FLUoxetine (PROZAC) 40 MG capsule [id number] Yes Take 1 capsule (40 mg total) by mouth daily. Einar Pheasant, MD Taking Active   hydrochlorothiazide (MICROZIDE) 12.5 MG capsule [id number] Yes Take 1 capsule (12.5 mg total) by mouth daily. Einar Pheasant, MD Taking Active   HYDROcodone-acetaminophen [facility]) 7.5-325 MG tablet [id number] Yes Take 1.5 tablets by mouth as needed. Using 1-2 times daily [provider] Taking Active            Med Note Mayo Ao Sep 21, 2021  3:33 PM) Taking once daily  meloxicam (MOBIC) 15 MG tablet [id number] Yes Take 1 tablet (15 mg total) by mouth daily. Einar Pheasant, MD Taking Active   pantoprazole (PROTONIX) 40 MG tablet [id number] Yes Take 1 tablet (40 mg total) by mouth daily. Einar Pheasant, MD Taking Active   potassium chloride (KLOR-CON) 10 MEQ tablet [id number] Yes Take 1 tablet (10 mEq total) by mouth daily. Einar Pheasant, MD Taking Active   pravastatin (PRAVACHOL) 10 MG tablet [id number] Yes Take 1 tablet (10 mg total) by mouth daily. Einar Pheasant, MD Taking Active             Patient Active Problem List   Diagnosis Date Noted   UTI (urinary tract infection) 08/29/2021   Leg pain 05/13/2021   Double vision 03/14/2021   Female bladder prolapse 03/14/2021   Tinnitus 07/05/2020   Chronic left  sacroiliac pain 05/21/2020   Chronic pain of left knee 05/21/2020   Bilateral hip pain 05/21/2020   Left elbow pain 05/21/2020   Fatigue 12/30/2019   Weight loss counseling, encounter for 12/30/2019   Breast cancer screening 07/07/2019   Aortic atherosclerosis ([place]) 02/10/2019   GERD (gastroesophageal reflux disease) 06/20/2018   Chest pain 01/06/2018   Lumbar stenosis with neurogenic claudication 09/15/2017   BMI 31.0-31.9,adult 04/25/2017   Hyperglycemia 04/25/2017   Personal history of tobacco use, presenting hazards to health 12/15/2016   Nausea without vomiting 03/29/2016   Personal history of colonic polyps    Anal fissure    Benign neoplasm of sigmoid colon    Left arm pain  08/03/2015   Cough 07/30/2015   Urinary hesitancy 07/30/2015   Health care maintenance 01/12/2015   Rectal irritation 11/24/2014   Gallstones 04/01/2014   Abdominal fullness 03/10/2014   Numbness and tingling 10/02/2013   Hypertension 10/02/2013   Chronic back pain 07/18/2013   Depression 07/18/2013   Anxiety 07/18/2013   Panic attacks 07/18/2013   Environmental allergies 07/18/2013   Hypercholesterolemia 07/18/2013   History of colonic polyps 07/18/2013    Immunization History  Administered Date(s) Administered   Fluad Quad(high Dose 65+) 08/11/2021   Influenza,inj,Quad PF,6+ Mos 09/05/2013, 08/05/2014, 08/20/2015, 08/23/2019   Influenza-Unspecified 09/11/2016, 09/03/2017, 08/21/2018, 11/07/2020   Tdap 06/15/2018    Conditions to be addressed/monitored: HLD and Depression  Care Plan : Medication Management  Updates made by De Hollingshead, RPH-CPP since 09/21/2021 12:00 AM     Problem: HLD, Depression, Anxiety, Chronic Pain      Long-Range Goal: Disease Progression Prevention   Start Date: 04/10/2021  Recent Progress: On track  Priority: High  Note:   Current Barriers:  Unable to achieve control of cholesterol   Pharmacist Clinical Goal(s):  Over the next 90 days, patient  will achieve control of cholesterol through collaboration with PharmD and provider.   Interventions: 1:1 collaboration with Einar Pheasant, MD regarding development and update of comprehensive plan of care as evidenced by provider attestation and co-signature Inter-disciplinary care team collaboration (see longitudinal plan of care) Comprehensive medication review performed; medication list updated in electronic medical record   Hyperlipidemia, ASCVD risk reduction: Controlled; current treatment: pravastatin 10 mg daily, ezetimibe 10 mg daily, Praluent 75 mg Q14 days Pre Praluent LDL: 158; post Praluent LDL: 42 - 73% reduction in LDL [place] funding 03/31/21-03/30/22 Medications previously tried: pravastatin 20 mg daily - myalgias; atorvastatin, rosuvastatin - myalgias, she does not remember doses and pharmacy records do not go back that far.  Antiplatelet regimen: aspirin 81 mg daily  Given significant improvement in LDL, recommend discontinuation of ezetimibe to reduce pill burden. Patient amenable. Continue Praluent 75 mg Q14 days and pravastatin 10 mg daily   Hypertension: Controlled per last office visit; current treatment: HCTZ 12.5 mg daily, potassium 10 mEq daily  Due for refills on both. Sent today.  Recommended to continue current regimen at this time.   Depression/Anxiety, Chronic Pain, Insomnia Uncontrolled; current treatment: fluoxetine 40 mg daily; alprazolam 0.5 mg BID Hx buspirone with no benefit. Discussion last visit w/ Dr. Nicki Reaper about avoiding BZD. Hx duloxetine  Hx trazodone Hx alprazolam - requested to discontinue. Has not slept well since Chronic pain: current regimen: hydrocodone/acetaminophen 7.5/325 mg 1.5 tablet PRN, 0-2 times daily, meloxicam 15 mg daily, gabapentin 600 mg QPM, tizanidine 4 mg PRN, but only uses if needed. Continues to follow w/ Dr. Arnoldo Morale at [facility] Previously recommended to continue current regimen at this  time  Patient Goals/Self-Care Activities Over the next 90 days, patient will:  - take medications as prescribed collaborate with provider on medication access solutions  Follow Up Plan: Telephone follow up appointment with care management team member scheduled for: 4 months     Medication Assistance: None required.  Patient affirms current coverage meets needs.  Patient's preferred pharmacy is:  [facility], [address] [place] [address] Phone: [phone] Fax: [place] [address] Phone: [phone] Fax: [phone]   Follow Up:  Patient agrees to Care Plan and Follow-up.  Plan: Telephone follow up appointment with care management team member scheduled for:  4 monhs  Catie Darnelle Maffucci, PharmD, [place], [place] Clinical Pharmacist [employer] at [employer] [phone]

## 2021-09-22 ENCOUNTER — Other Ambulatory Visit (HOSPITAL_COMMUNITY): Payer: Self-pay

## 2021-09-23 ENCOUNTER — Other Ambulatory Visit (HOSPITAL_COMMUNITY): Payer: Self-pay

## 2021-09-26 ENCOUNTER — Other Ambulatory Visit: Payer: Self-pay | Admitting: Internal Medicine

## 2021-09-26 DIAGNOSIS — I10 Essential (primary) hypertension: Secondary | ICD-10-CM

## 2021-10-05 DIAGNOSIS — I10 Essential (primary) hypertension: Secondary | ICD-10-CM

## 2021-10-05 DIAGNOSIS — E7800 Pure hypercholesterolemia, unspecified: Secondary | ICD-10-CM | POA: Diagnosis not present

## 2021-10-19 ENCOUNTER — Other Ambulatory Visit: Payer: Self-pay | Admitting: Internal Medicine

## 2021-10-19 ENCOUNTER — Other Ambulatory Visit (HOSPITAL_COMMUNITY): Payer: Self-pay

## 2021-10-26 ENCOUNTER — Other Ambulatory Visit: Payer: Self-pay | Admitting: Internal Medicine

## 2021-11-09 ENCOUNTER — Other Ambulatory Visit (INDEPENDENT_AMBULATORY_CARE_PROVIDER_SITE_OTHER): Payer: Medicare Other

## 2021-11-09 ENCOUNTER — Other Ambulatory Visit: Payer: Self-pay

## 2021-11-09 DIAGNOSIS — R739 Hyperglycemia, unspecified: Secondary | ICD-10-CM

## 2021-11-09 DIAGNOSIS — I10 Essential (primary) hypertension: Secondary | ICD-10-CM | POA: Diagnosis not present

## 2021-11-09 DIAGNOSIS — E7800 Pure hypercholesterolemia, unspecified: Secondary | ICD-10-CM

## 2021-11-09 LAB — HEMOGLOBIN A1C: Hgb A1c MFr Bld: 5.7 % (ref 4.6–6.5)

## 2021-11-09 LAB — BASIC METABOLIC PANEL
BUN: 19 mg/dL (ref 6–23)
CO2: 28 mEq/L (ref 19–32)
Calcium: 9.5 mg/dL (ref 8.4–10.5)
Chloride: 105 mEq/L (ref 96–112)
Creatinine, Ser: 0.8 mg/dL (ref 0.40–1.20)
GFR: 77.14 mL/min (ref >60.00)
Glucose, Bld: 85 mg/dL (ref 70–99)
Potassium: 4.2 mEq/L (ref 3.5–5.1)
Sodium: 141 mEq/L (ref 135–145)

## 2021-11-09 LAB — HEPATIC FUNCTION PANEL
ALT: 8 U/L (ref 0–35)
AST: 17 U/L (ref 0–37)
Albumin: 4.3 g/dL (ref 3.5–5.2)
Alkaline Phosphatase: 82 U/L (ref 39–117)
Bilirubin, Direct: 0.1 mg/dL (ref 0.0–0.3)
Total Bilirubin: 0.5 mg/dL (ref 0.2–1.2)
Total Protein: 6.5 g/dL (ref 6.0–8.3)

## 2021-11-09 LAB — LIPID PANEL
Cholesterol: 174 mg/dL (ref 0–200)
HDL: 60.5 mg/dL (ref >39.00)
LDL Cholesterol: 78 mg/dL (ref 0–99)
NonHDL: 113.11
Total CHOL/HDL Ratio: 3
Triglycerides: 178 mg/dL — ABNORMAL HIGH (ref 0.0–149.0)
VLDL: 35.6 mg/dL (ref 0.0–40.0)

## 2021-11-09 LAB — TSH: TSH: 1.35 u[IU]/mL (ref 0.35–5.50)

## 2021-11-11 ENCOUNTER — Ambulatory Visit (INDEPENDENT_AMBULATORY_CARE_PROVIDER_SITE_OTHER): Payer: Medicare Other | Admitting: Internal Medicine

## 2021-11-11 ENCOUNTER — Encounter: Payer: Self-pay | Admitting: Internal Medicine

## 2021-11-11 ENCOUNTER — Other Ambulatory Visit: Payer: Self-pay

## 2021-11-11 VITALS — BP 128/86 | HR 85 | Temp 98.8°F | Resp 16 | Ht 65.0 in | Wt 181.0 lb

## 2021-11-11 DIAGNOSIS — E7800 Pure hypercholesterolemia, unspecified: Secondary | ICD-10-CM

## 2021-11-11 DIAGNOSIS — I700 Atherosclerosis of aorta: Secondary | ICD-10-CM

## 2021-11-11 DIAGNOSIS — I10 Essential (primary) hypertension: Secondary | ICD-10-CM

## 2021-11-11 DIAGNOSIS — K219 Gastro-esophageal reflux disease without esophagitis: Secondary | ICD-10-CM

## 2021-11-11 DIAGNOSIS — E538 Deficiency of other specified B group vitamins: Secondary | ICD-10-CM | POA: Diagnosis not present

## 2021-11-11 DIAGNOSIS — F419 Anxiety disorder, unspecified: Secondary | ICD-10-CM

## 2021-11-11 DIAGNOSIS — F32A Depression, unspecified: Secondary | ICD-10-CM

## 2021-11-11 DIAGNOSIS — M549 Dorsalgia, unspecified: Secondary | ICD-10-CM

## 2021-11-11 DIAGNOSIS — G8929 Other chronic pain: Secondary | ICD-10-CM

## 2021-11-11 DIAGNOSIS — Z86010 Personal history of colonic polyps: Secondary | ICD-10-CM

## 2021-11-11 DIAGNOSIS — R739 Hyperglycemia, unspecified: Secondary | ICD-10-CM

## 2021-11-11 DIAGNOSIS — M48062 Spinal stenosis, lumbar region with neurogenic claudication: Secondary | ICD-10-CM

## 2021-11-11 MED ORDER — CYANOCOBALAMIN 1000 MCG/ML IJ SOLN
1000.0000 ug | Freq: Once | INTRAMUSCULAR | Status: AC
  Administered 2021-11-11: 1000 ug via INTRAMUSCULAR

## 2021-11-11 MED ORDER — DULOXETINE HCL 30 MG PO CPEP: 30.0000 mg | ORAL_CAPSULE | Freq: Every day | ORAL | 2 refills | Status: DC

## 2021-11-11 MED ORDER — ALPRAZOLAM 0.5 MG PO TABS: 0.5000 mg | ORAL_TABLET | Freq: Every day | ORAL | 0 refills | Status: DC | PRN

## 2021-11-11 NOTE — Progress Notes (Signed)
Patient ID: Angel Riddle, female   DOB: 1956/10/27, 65 y.o.   MRN: [id number]   Subjective:    Patient ID: Angel Riddle, female    DOB: June 20, 1956, 65 y.o.   MRN: [id number]  This visit occurred during the SARS-CoV-2 public health emergency.  Safety protocols were in place, including screening questions prior to the visit, additional usage of staff PPE, and extensive cleaning of exam room while observing appropriate contact time as indicated for disinfecting solutions.   Patient here for scheduled follow up.  Chief Complaint  Patient presents with   Follow-up    Dis Cuss Prozac   .   HPI Follow up regarding increased stress, blood pressure and increased stress.  Saw Dr Bary Castilla 09/2021 for evaluation colon cancer screening.  Recommended cologuard.  Hold colonoscopy until 2025-2026.  No chest pain.  Breathing stable.  Seeing [facility] for chronic back pain.  No increased cough and congestion.  No abdominal pain or bowel change reported.  Main concern is increased stress.  Discussed.  Stress - specifically related to husband's health issues.  She has been on prozac for many years.  Was questioning if need to be on something different or something in addition to prozac.  Was specifically questioning if cymbalta would help.  Discussed cymbalta.  Discussed cymbalta may help with pain as well.  Discussed again need for f/u with psych.  She request xanax.     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   Chronic back pain    degenerative disc disease   Degenerative disc disease, lumbar    Depression    Dyspnea    with exertion / pain   Gallstones    GERD (gastroesophageal reflux disease)    Heart murmur    followed by PCP   Hx of colonic polyp    Hx: UTI (urinary tract infection)    Hypercholesterolemia    Hypertension    Panic attacks    H/O   Wears dentures    partial upper and lower   Past Surgical History:   Procedure Laterality Date   ABDOMINAL HYSTERECTOMY  1992   partial   BACK SURGERY     lumbar fusion   CATARACT EXTRACTION Bilateral 2010,2011   CHOLECYSTECTOMY  04-19-14   COLONOSCOPY  07/02/11   DR. Byrnett   COLONOSCOPY WITH PROPOFOL N/A 09/25/2015   Procedure: COLONOSCOPY WITH PROPOFOL;  Surgeon: Lucilla Lame, MD;  Location: [facility];  Service: Endoscopy;  Laterality: N/A;   FOOT SURGERY Right 2009   MOUTH SURGERY     POLYPECTOMY  09/25/2015   Procedure: POLYPECTOMY;  Surgeon: Lucilla Lame, MD;  Location: [facility];  Service: Endoscopy;;   skin surgery Left 08/27/13   Basal cell removal-left nostril   TONSILLECTOMY AND ADENOIDECTOMY  1969   Family History  Problem Relation Age of Onset   Hyperlipidemia Mother    Hypertension Mother    Alcohol abuse Father    Hyperlipidemia Father    Heart disease Father    Diabetes Father    Hyperlipidemia Sister    Lung cancer Brother    Hyperlipidemia Brother    Breast cancer Neg Hx    Social History   Socioeconomic History   Marital status: Married    Spouse name: Not on file   Number of children: 2   Years of education: Not on  file   Highest education level: Not on file  Occupational History   Not on file  Tobacco Use   Smoking status: Former    Packs/day: 1.00    Years: 37.00    Pack years: 37.00    Types: Cigarettes    Start date: 08/07/2019    Quit date: 05/25/2021    Years since quitting: 0.4   Smokeless tobacco: Never  Vaping Use   Vaping Use: Former  Substance and Sexual Activity   Alcohol use: Yes    Alcohol/week: 1.0 standard drink    Types: 1 Glasses of wine per week   Drug use: No   Sexual activity: Not on file  Other Topics Concern   Not on file  Social History Narrative   Not on file   Social Determinants of Health   Financial Resource Strain: Medium Risk   Difficulty of Paying Living Expenses: Somewhat hard  Food Insecurity: Not on file  Transportation Needs: Not on file   Physical Activity: Not on file  Stress: Stress Concern Present   Feeling of Stress : Rather much  Social Connections: Not on file     Review of Systems  Constitutional:  Negative for appetite change and unexpected weight change.  HENT:  Negative for congestion and sinus pressure.   Respiratory:  Negative for cough, chest tightness and shortness of breath.   Cardiovascular:  Negative for chest pain, palpitations and leg swelling.  Gastrointestinal:  Negative for abdominal pain, diarrhea, nausea and vomiting.  Genitourinary:  Negative for difficulty urinating and dysuria.  Musculoskeletal:  Positive for back pain. Negative for joint swelling and myalgias.  Skin:  Negative for color change and rash.  Neurological:  Negative for dizziness, light-headedness and headaches.  Psychiatric/Behavioral:         Increased stress and anxiety as outlined.        Objective:     BP 128/86   Pulse 85   Temp 98.8 F (37.1 C) (Oral)   Resp 16   Ht '5\' 5"'  (1.651 m)   Wt 181 lb (82.1 kg)   SpO2 96%   BMI 30.12 kg/m  Wt Readings from Last 3 Encounters:  11/11/21 181 lb (82.1 kg)  09/01/21 176 lb (79.8 kg)  08/19/21 176 lb (79.8 kg)    Physical Exam Vitals reviewed.  Constitutional:      General: She is not in acute distress.    Appearance: Normal appearance.  HENT:     Head: Normocephalic and atraumatic.     Right Ear: External ear normal.     Left Ear: External ear normal.  Eyes:     General: No scleral icterus.       Right eye: No discharge.        Left eye: No discharge.     Conjunctiva/sclera: Conjunctivae normal.  Neck:     Thyroid: No thyromegaly.  Cardiovascular:     Rate and Rhythm: Normal rate and regular rhythm.  Pulmonary:     Effort: No respiratory distress.     Breath sounds: Normal breath sounds. No wheezing.  Abdominal:     General: Bowel sounds are normal.     Palpations: Abdomen is soft.     Tenderness: There is no abdominal tenderness.  Musculoskeletal:         General: No swelling or tenderness.     Cervical back: Neck supple. No tenderness.  Lymphadenopathy:     Cervical: No cervical adenopathy.  Skin:    Findings: No  erythema or rash.  Neurological:     Mental Status: She is alert.  Psychiatric:        Mood and Affect: Mood normal.        Behavior: Behavior normal.     Outpatient Encounter Medications as of 11/11/2021  Medication Sig   Alirocumab (PRALUENT) 75 MG/ML SOAJ Inject 1 pen (75 mg) into the skin every 14 days.   aspirin EC 81 MG tablet Take 1 tablet (81 mg total) by mouth daily. Swallow whole.   DULoxetine (CYMBALTA) 30 MG capsule Take 1 capsule (30 mg total) by mouth daily.   FLUoxetine (PROZAC) 40 MG capsule Take 1 capsule (40 mg total) by mouth daily.   hydrochlorothiazide (MICROZIDE) 12.5 MG capsule Take 1 capsule (12.5 mg total) by mouth daily.   HYDROcodone-acetaminophen (NORCO) 7.5-325 MG tablet Take 1.5 tablets by mouth as needed. Using 1-2 times daily   meloxicam (MOBIC) 15 MG tablet Take 1 tablet (15 mg total) by mouth daily.   pantoprazole (PROTONIX) 40 MG tablet Take 1 tablet (40 mg total) by mouth daily.   potassium chloride (KLOR-CON) 10 MEQ tablet Take 1 tablet (10 mEq total) by mouth daily.   pravastatin (PRAVACHOL) 10 MG tablet Take 1 tablet (10 mg total) by mouth daily.   [DISCONTINUED] ALPRAZolam (XANAX) 0.5 MG tablet Take 1 tablet (0.5 mg total) by mouth 2 (two) times daily as needed for anxiety.   ALPRAZolam (XANAX) 0.5 MG tablet Take 1 tablet (0.5 mg total) by mouth daily as needed for anxiety.   [EXPIRED] cyanocobalamin ((VITAMIN B-12)) injection 1,000 mcg    No facility-administered encounter medications on file as of 11/11/2021.     Lab Results  Component Value Date   WBC 8.5 07/24/2021   HGB 13.6 07/24/2021   HCT 41.2 07/24/2021   PLT 309.0 07/24/2021   GLUCOSE 85 11/09/2021   CHOL 174 11/09/2021   TRIG 178.0 (H) 11/09/2021   HDL 60.50 11/09/2021   LDLDIRECT 158.0 03/05/2021   LDLCALC 78  11/09/2021   ALT 8 11/09/2021   AST 17 11/09/2021   NA 141 11/09/2021   K 4.2 11/09/2021   CL 105 11/09/2021   CREATININE 0.80 11/09/2021   BUN 19 11/09/2021   CO2 28 11/09/2021   TSH 1.35 11/09/2021   HGBA1C 5.7 11/09/2021    MR Brain W Wo Contrast  Result Date: 04/07/2021 CLINICAL DATA:  [facility]. EXAM: MRI HEAD WITHOUT AND WITH CONTRAST TECHNIQUE: Multiplanar, multiecho pulse sequences of the brain and surrounding structures were obtained without and with intravenous contrast. CONTRAST:  7.6m GADAVIST GADOBUTROL 1 MMOL/ML IV SOLN COMPARISON:  Head CT July 11, 2018.  MRI of the brain Apr 30, 2010. FINDINGS: Brain: No acute infarction, hemorrhage, hydrocephalus, extra-axial collection or mass lesion. Few scattered foci of T2 hyperintensity are seen within the white matter of the cerebral hemispheres, nonspecific. No focus of abnormal contrast enhancement. Vascular: Normal flow voids. Skull and upper cervical spine: Normal marrow signal. Sinuses/Orbits: Bilateral lens surgery. Paranasal sinuses are clear. Other: Partial empty sella, unchanged since 2011. IMPRESSION: Mild chronic white matter ischemic changes. Electronically Signed   By: KPedro EarlsM.D.   On: 04/07/2021 16:45       Assessment & Plan:   Problem List Items Addressed This Visit     Anxiety    Treat with cymbalta as outlined.  Add to prozac.  Follow closely.  Previously tried buspar and gabapentin - did not feel helped.  I gave her xanax .563m#20  to use sparingly.  Long discussed regarding xanax.        Relevant Medications   DULoxetine (CYMBALTA) 30 MG capsule   ALPRAZolam (XANAX) 0.5 MG tablet   Aortic atherosclerosis (HCC)    Low cholesterol diet and exercise.  Continue pravastatin. Fff zetia and on praluent.   Follow lipid panel and liver function tests.  Lab Results  Component Value Date   CHOL 174 11/09/2021   HDL 60.50 11/09/2021   LDLCALC 78 11/09/2021   LDLDIRECT 158.0 03/05/2021   TRIG  178.0 (H) 11/09/2021   CHOLHDL 3 11/09/2021         Chronic back pain    S/p previous surgery. Persistent pain.  Seeing [facility].       Relevant Medications   DULoxetine (CYMBALTA) 30 MG capsule   Depression    Increased stress and depression.  On prozac and has been on this medication for many years.  Will add cymbalta - starting 62m.  Titrate up once can assess tolerance and response.  Discussed f/u with psych.  Will notify me when agreeable.  Did request xanax.  Will give her #20 of xanax .582mto have if needed.  Discussed using sparingly.        Relevant Medications   DULoxetine (CYMBALTA) 30 MG capsule   ALPRAZolam (XANAX) 0.5 MG tablet   GERD (gastroesophageal reflux disease)    No upper symptoms reported.  Continue prilosec.        History of colonic polyps    Colonoscopy 09/2015.  Recommended f/u in 5 years.  Saw Dr ByBary Castilla Recommended cologuard.  Recommended holding on colonoscopy until 2025-2026.       Hypercholesterolemia    Low cholesterol diet and exercise.  Continue pravastatin.  Seeing Catie - praluent. Follow lipid panel and liver function tests.   Lab Results  Component Value Date   HGBA1C 5.7 11/09/2021       Hyperglycemia    Low carb diet and exercise.  Follow met b and a1c.  Lab Results  Component Value Date   HGBA1C 5.7 11/09/2021       Hypertension    Continue hctz.  Follow pressures.        Lumbar stenosis with neurogenic claudication    Being followed by [facility].       Relevant Medications   DULoxetine (CYMBALTA) 30 MG capsule   Personal history of colonic polyps    Saw Dr ByBary Castilla Recommended cologuard. Recommended holding colonoscopy until 2025-2026.        Other Visit Diagnoses     B12 deficiency    -  Primary   Relevant Medications   cyanocobalamin ((VITAMIN B-12)) injection 1,000 mcg (Completed)       I spent 45 minutes with the patient and more than 50% of the time was spent in consultation regarding the above.  Time spent  discussing current symptoms and concerns.  Time also spent discussing further treatment and evaluation.    ChEinar PheasantMD

## 2021-11-15 ENCOUNTER — Encounter: Payer: Self-pay | Admitting: Internal Medicine

## 2021-11-15 NOTE — Assessment & Plan Note (Signed)
No upper symptoms reported.  Continue prilosec.  

## 2021-11-15 NOTE — Assessment & Plan Note (Signed)
Being followed by [facility].

## 2021-11-15 NOTE — Assessment & Plan Note (Signed)
Colonoscopy 09/2015.  Recommended f/u in 5 years.  Saw Dr Bary Castilla.  Recommended cologuard.  Recommended holding on colonoscopy until 2025-2026.

## 2021-11-15 NOTE — Assessment & Plan Note (Signed)
Saw Dr Byrnett.  Recommended cologuard. Recommended holding colonoscopy until 2025-2026.   

## 2021-11-15 NOTE — Assessment & Plan Note (Signed)
Increased stress and depression.  On prozac and has been on this medication for many years.  Will add cymbalta - starting 30mg .  Titrate up once can assess tolerance and response.  Discussed f/u with psych.  Will notify me when agreeable.  Did request xanax.  Will give her #20 of xanax .5mg  to have if needed.  Discussed using sparingly.

## 2021-11-15 NOTE — Assessment & Plan Note (Signed)
S/p previous surgery. Persistent pain.  Seeing [facility].  

## 2021-11-15 NOTE — Assessment & Plan Note (Signed)
Low cholesterol diet and exercise.  Continue pravastatin.  Seeing [facility] - praluent. Follow lipid panel and liver function tests.   Lab Results  Component Value Date   HGBA1C 5.7 11/09/2021

## 2021-11-15 NOTE — Assessment & Plan Note (Signed)
Treat with cymbalta as outlined.  Add to prozac.  Follow closely.  Previously tried buspar and gabapentin - did not feel helped.  I gave her xanax .5mg  #20 to use sparingly.  Long discussed regarding xanax.

## 2021-11-15 NOTE — Assessment & Plan Note (Signed)
Low carb diet and exercise.  Follow met b and a1c.  Lab Results  Component Value Date   HGBA1C 5.7 11/09/2021

## 2021-11-15 NOTE — Assessment & Plan Note (Signed)
Low cholesterol diet and exercise.  Continue pravastatin. Fff zetia and on praluent.   Follow lipid panel and liver function tests.  Lab Results  Component Value Date   CHOL 174 11/09/2021   HDL 60.50 11/09/2021   LDLCALC 78 11/09/2021   LDLDIRECT 158.0 03/05/2021   TRIG 178.0 (H) 11/09/2021   CHOLHDL 3 11/09/2021

## 2021-11-15 NOTE — Assessment & Plan Note (Signed)
Continue hctz.  Follow pressures.

## 2021-11-18 ENCOUNTER — Other Ambulatory Visit (HOSPITAL_COMMUNITY): Payer: Self-pay

## 2021-11-19 ENCOUNTER — Encounter: Payer: Self-pay | Admitting: Internal Medicine

## 2021-11-21 ENCOUNTER — Other Ambulatory Visit: Payer: Self-pay | Admitting: Internal Medicine

## 2021-11-21 DIAGNOSIS — I10 Essential (primary) hypertension: Secondary | ICD-10-CM

## 2021-12-14 ENCOUNTER — Other Ambulatory Visit: Payer: Self-pay | Admitting: Internal Medicine

## 2021-12-15 ENCOUNTER — Other Ambulatory Visit (HOSPITAL_COMMUNITY): Payer: Self-pay

## 2021-12-22 DIAGNOSIS — H02831 Dermatochalasis of right upper eyelid: Secondary | ICD-10-CM | POA: Diagnosis not present

## 2021-12-22 DIAGNOSIS — H02834 Dermatochalasis of left upper eyelid: Secondary | ICD-10-CM | POA: Diagnosis not present

## 2021-12-28 ENCOUNTER — Telehealth: Payer: Medicare Other

## 2021-12-28 ENCOUNTER — Telehealth: Payer: Self-pay | Admitting: Pharmacist

## 2021-12-28 NOTE — Telephone Encounter (Signed)
°  Chronic Care Management   Note  12/28/2021 Name: Angel Riddle MRN: [id number] DOB: 1956/04/22   Attempted to contact patient for scheduled appointment for medication management support. Left HIPAA compliant message for patient to return my call at their convenience.    Plan: - If I do not hear back from the patient by end of business today, will collaborate with Care Guide to outreach to schedule follow up with me  Catie Darnelle Maffucci, PharmD, [place], [place] Pharmacist [employer] at [employer] [phone]

## 2021-12-31 ENCOUNTER — Telehealth: Payer: Self-pay

## 2021-12-31 NOTE — Chronic Care Management (AMB) (Signed)
°  Care Management   Note  12/31/2021 Name: KYLEY SOLOW MRN: [id number] DOB: [birth year]  Angel Riddle is a 66 y.o. year old female who is a primary care patient of Einar Pheasant, MD and is actively engaged with the care management team. I reached out to [place] by phone today to assist with re-scheduling a follow up visit with the Pharmacist  Follow up plan: Unsuccessful telephone outreach attempt made. A HIPAA compliant phone message was left for the patient providing contact information and requesting a return call.  The care management team will reach out to the patient again over the next 7 days.  If patient returns call to provider office, please advise to call [facility]  at [place], [place], [place], [place] [address] Direct Dial: [phone] [email or web address] Website: [email or web address]

## 2021-12-31 NOTE — Chronic Care Management (AMB) (Signed)
°  Care Management   Note  12/31/2021 Name: Angel Riddle MRN: [id number] DOB: Feb 16, 1956  Angel Riddle is a 66 y.o. year old female who is a primary care patient of Einar Pheasant, MD and is actively engaged with the care management team. I reached out to [place] by phone today to assist with re-scheduling a follow up visit with the Pharmacist  Follow up plan: Telephone appointment with care management team member scheduled for:02/12/2022  Noreene Larsson, [place], [place], [place] [address] Direct Dial: [phone] [email or web address]: [email or web address]

## 2022-01-11 ENCOUNTER — Other Ambulatory Visit (HOSPITAL_COMMUNITY): Payer: Self-pay

## 2022-01-11 DIAGNOSIS — M180 Bilateral primary osteoarthritis of first carpometacarpal joints: Secondary | ICD-10-CM | POA: Diagnosis not present

## 2022-01-11 DIAGNOSIS — I700 Atherosclerosis of aorta: Secondary | ICD-10-CM | POA: Diagnosis not present

## 2022-01-11 DIAGNOSIS — M1812 Unilateral primary osteoarthritis of first carpometacarpal joint, left hand: Secondary | ICD-10-CM | POA: Diagnosis not present

## 2022-01-13 ENCOUNTER — Telehealth: Payer: Self-pay | Admitting: Internal Medicine

## 2022-01-14 ENCOUNTER — Other Ambulatory Visit: Payer: Self-pay

## 2022-01-14 ENCOUNTER — Ambulatory Visit (INDEPENDENT_AMBULATORY_CARE_PROVIDER_SITE_OTHER): Payer: Medicare Other | Admitting: Internal Medicine

## 2022-01-14 ENCOUNTER — Encounter: Payer: Self-pay | Admitting: Internal Medicine

## 2022-01-14 VITALS — BP 128/70 | HR 90 | Temp 97.9°F | Resp 16 | Wt 183.0 lb

## 2022-01-14 DIAGNOSIS — K219 Gastro-esophageal reflux disease without esophagitis: Secondary | ICD-10-CM | POA: Diagnosis not present

## 2022-01-14 DIAGNOSIS — R300 Dysuria: Secondary | ICD-10-CM

## 2022-01-14 DIAGNOSIS — Z86010 Personal history of colonic polyps: Secondary | ICD-10-CM | POA: Diagnosis not present

## 2022-01-14 DIAGNOSIS — F32A Depression, unspecified: Secondary | ICD-10-CM

## 2022-01-14 DIAGNOSIS — E7800 Pure hypercholesterolemia, unspecified: Secondary | ICD-10-CM

## 2022-01-14 DIAGNOSIS — E538 Deficiency of other specified B group vitamins: Secondary | ICD-10-CM | POA: Diagnosis not present

## 2022-01-14 DIAGNOSIS — I10 Essential (primary) hypertension: Secondary | ICD-10-CM | POA: Diagnosis not present

## 2022-01-14 DIAGNOSIS — R739 Hyperglycemia, unspecified: Secondary | ICD-10-CM | POA: Diagnosis not present

## 2022-01-14 DIAGNOSIS — I700 Atherosclerosis of aorta: Secondary | ICD-10-CM

## 2022-01-14 LAB — POCT URINALYSIS DIPSTICK
Bilirubin, UA: NEGATIVE
Glucose, UA: NEGATIVE
Ketones, UA: NEGATIVE
Leukocytes, UA: NEGATIVE
Nitrite, UA: POSITIVE
Protein, UA: NEGATIVE
Spec Grav, UA: 1.025 (ref 1.010–1.025)
Urobilinogen, UA: 0.2 E.U./dL
pH, UA: 6 (ref 5.0–8.0)

## 2022-01-14 LAB — URINALYSIS, MICROSCOPIC ONLY

## 2022-01-14 MED ORDER — DULOXETINE HCL 60 MG PO CPEP: 60.0000 mg | ORAL_CAPSULE | Freq: Every day | ORAL | 2 refills | Status: DC

## 2022-01-14 MED ORDER — CYANOCOBALAMIN 1000 MCG/ML IJ SOLN
1000.0000 ug | Freq: Once | INTRAMUSCULAR | Status: AC
  Administered 2022-01-14: 1000 ug via INTRAMUSCULAR

## 2022-01-14 MED ORDER — CEPHALEXIN 500 MG PO CAPS: 500.0000 mg | ORAL_CAPSULE | Freq: Two times a day (BID) | ORAL | 0 refills | Status: DC

## 2022-01-14 NOTE — Progress Notes (Signed)
Patient ID: Angel Riddle, female   DOB: [birth year], 66 y.o.   MRN: [id number]   Subjective:    Patient ID: Angel Riddle, female    DOB: 04-19-1956, 66 y.o.   MRN: [id number]  This visit occurred during the SARS-CoV-2 public health emergency.  Safety protocols were in place, including screening questions prior to the visit, additional usage of staff PPE, and extensive cleaning of exam room while observing appropriate contact time as indicated for disinfecting solutions.   Patient here for a scheduled follow up.   Chief Complaint  Patient presents with   Anxiety   Hypertension   .   HPI Increased stress with her husband's medical issues.  Discussed.  Started on cymbalta last visit.  Feels is helping. Would like to increase the dose.  Remains on prozac.  Has xanax if needed.  No chest pain.  Breathing stable.  No increased cough. Some sinus drainage. Takes sudafed prn.  Does not feel needs anything further.  No abdominal pain.  No bowel change.  Did reports some dysuria.     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   Chronic back pain    degenerative disc disease   Degenerative disc disease, lumbar    Depression    Dyspnea    with exertion / pain   Gallstones    GERD (gastroesophageal reflux disease)    Heart murmur    followed by PCP   Hx of colonic polyp    Hx: UTI (urinary tract infection)    Hypercholesterolemia    Hypertension    Panic attacks    H/O   Wears dentures    partial upper and lower   Past Surgical History:  Procedure Laterality Date   ABDOMINAL HYSTERECTOMY  1992   partial   BACK SURGERY     lumbar fusion   CATARACT EXTRACTION Bilateral 2010,2011   CHOLECYSTECTOMY  04-19-14   COLONOSCOPY  07/02/11   DR. Byrnett   COLONOSCOPY WITH PROPOFOL N/A 09/25/2015   Procedure: COLONOSCOPY WITH PROPOFOL;  Surgeon: Lucilla Lame, MD;  Location: [facility];  Service: Endoscopy;   Laterality: N/A;   FOOT SURGERY Right 2009   MOUTH SURGERY     POLYPECTOMY  09/25/2015   Procedure: POLYPECTOMY;  Surgeon: Lucilla Lame, MD;  Location: [facility];  Service: Endoscopy;;   skin surgery Left 08/27/13   Basal cell removal-left nostril   TONSILLECTOMY AND ADENOIDECTOMY  1969   Family History  Problem Relation Age of Onset   Hyperlipidemia Mother    Hypertension Mother    Alcohol abuse Father    Hyperlipidemia Father    Heart disease Father    Diabetes Father    Hyperlipidemia Sister    Lung cancer Brother    Hyperlipidemia Brother    Breast cancer Neg Hx    Social History   Socioeconomic History   Marital status: Married    Spouse name: Not on file   Number of children: 2   Years of education: Not on file   Highest education level: Not on file  Occupational History   Not on file  Tobacco Use   Smoking status: Former    Packs/day: 1.00    Years: 37.00    Pack years: 37.00    Types: Cigarettes    Start date: 08/07/2019    Quit date: 05/25/2021  Years since quitting: 0.6   Smokeless tobacco: Never  Vaping Use   Vaping Use: Former  Substance and Sexual Activity   Alcohol use: Yes    Alcohol/week: 1.0 standard drink    Types: 1 Glasses of wine per week   Drug use: No   Sexual activity: Not on file  Other Topics Concern   Not on file  Social History Narrative   Not on file   Social Determinants of Health   Financial Resource Strain: Medium Risk   Difficulty of Paying Living Expenses: Somewhat hard  Food Insecurity: Not on file  Transportation Needs: Not on file  Physical Activity: Not on file  Stress: Stress Concern Present   Feeling of Stress : Rather much  Social Connections: Not on file     Review of Systems  Constitutional:  Negative for appetite change and unexpected weight change.  HENT:  Positive for postnasal drip. Negative for sinus pressure.   Respiratory:  Negative for cough, chest tightness and shortness of breath.    Cardiovascular:  Negative for chest pain, palpitations and leg swelling.  Gastrointestinal:  Negative for abdominal pain, diarrhea, nausea and vomiting.  Genitourinary:  Negative for difficulty urinating and dysuria.  Musculoskeletal:  Negative for joint swelling and myalgias.       Seeing ortho - bilateral thumb injections.    Skin:  Negative for color change and rash.  Neurological:  Negative for dizziness, light-headedness and headaches.  Psychiatric/Behavioral:  Negative for agitation and dysphoric mood.       Objective:     BP 128/70    Pulse 90    Temp 97.9 F (36.6 C)    Resp 16    Wt 183 lb (83 kg)    SpO2 97%    BMI 30.45 kg/m  Wt Readings from Last 3 Encounters:  01/14/22 183 lb (83 kg)  11/11/21 181 lb (82.1 kg)  09/01/21 176 lb (79.8 kg)    Physical Exam Vitals reviewed.  Constitutional:      General: She is not in acute distress.    Appearance: Normal appearance.  HENT:     Head: Normocephalic and atraumatic.     Right Ear: External ear normal.     Left Ear: External ear normal.  Eyes:     General: No scleral icterus.       Right eye: No discharge.        Left eye: No discharge.     Conjunctiva/sclera: Conjunctivae normal.  Neck:     Thyroid: No thyromegaly.  Cardiovascular:     Rate and Rhythm: Normal rate and regular rhythm.  Pulmonary:     Effort: No respiratory distress.     Breath sounds: Normal breath sounds. No wheezing.  Abdominal:     General: Bowel sounds are normal.     Palpations: Abdomen is soft.     Tenderness: There is no abdominal tenderness.  Musculoskeletal:        General: No swelling or tenderness.     Cervical back: Neck supple. No tenderness.  Lymphadenopathy:     Cervical: No cervical adenopathy.  Skin:    Findings: No erythema or rash.  Neurological:     Mental Status: She is alert.  Psychiatric:        Mood and Affect: Mood normal.        Behavior: Behavior normal.     Outpatient Encounter Medications as of 01/14/2022   Medication Sig   cephALEXin (KEFLEX) 500 MG capsule Take  1 capsule (500 mg total) by mouth 2 (two) times daily.   DULoxetine (CYMBALTA) 60 MG capsule Take 1 capsule (60 mg total) by mouth daily.   Alirocumab (PRALUENT) 75 MG/ML SOAJ Inject 1 pen (75 mg) into the skin every 14 days.   ALPRAZolam (XANAX) 0.5 MG tablet Take 1 tablet (0.5 mg total) by mouth daily as needed for anxiety.   aspirin EC 81 MG tablet Take 1 tablet (81 mg total) by mouth daily. Swallow whole.   FLUoxetine (PROZAC) 40 MG capsule Take 1 capsule (40 mg total) by mouth daily.   hydrochlorothiazide (MICROZIDE) 12.5 MG capsule Take 1 capsule (12.5 mg total) by mouth daily.   HYDROcodone-acetaminophen (NORCO) 7.5-325 MG tablet Take 1.5 tablets by mouth as needed. Using 1-2 times daily   meloxicam (MOBIC) 15 MG tablet Take 1 tablet (15 mg total) by mouth daily.   pantoprazole (PROTONIX) 40 MG tablet Take 1 tablet (40 mg total) by mouth daily.   potassium chloride (KLOR-CON) 10 MEQ tablet Take 1 tablet (10 mEq total) by mouth daily.   pravastatin (PRAVACHOL) 10 MG tablet Take 1 tablet (10 mg total) by mouth daily.   [DISCONTINUED] DULoxetine (CYMBALTA) 30 MG capsule Take 1 capsule (30 mg total) by mouth daily.   [EXPIRED] cyanocobalamin ((VITAMIN B-12)) injection 1,000 mcg    No facility-administered encounter medications on file as of 01/14/2022.     Lab Results  Component Value Date   WBC 8.5 07/24/2021   HGB 13.6 07/24/2021   HCT 41.2 07/24/2021   PLT 309.0 07/24/2021   GLUCOSE 85 11/09/2021   CHOL 174 11/09/2021   TRIG 178.0 (H) 11/09/2021   HDL 60.50 11/09/2021   LDLDIRECT 158.0 03/05/2021   LDLCALC 78 11/09/2021   ALT 8 11/09/2021   AST 17 11/09/2021   NA 141 11/09/2021   K 4.2 11/09/2021   CL 105 11/09/2021   CREATININE 0.80 11/09/2021   BUN 19 11/09/2021   CO2 28 11/09/2021   TSH 1.35 11/09/2021   HGBA1C 5.7 11/09/2021    MR Brain W Wo Contrast  Result Date: 04/07/2021 CLINICAL DATA:  [facility]. EXAM:  MRI HEAD WITHOUT AND WITH CONTRAST TECHNIQUE: Multiplanar, multiecho pulse sequences of the brain and surrounding structures were obtained without and with intravenous contrast. CONTRAST:  7.13m GADAVIST GADOBUTROL 1 MMOL/ML IV SOLN COMPARISON:  Head CT July 11, 2018.  MRI of the brain Apr 30, 2010. FINDINGS: Brain: No acute infarction, hemorrhage, hydrocephalus, extra-axial collection or mass lesion. Few scattered foci of T2 hyperintensity are seen within the white matter of the cerebral hemispheres, nonspecific. No focus of abnormal contrast enhancement. Vascular: Normal flow voids. Skull and upper cervical spine: Normal marrow signal. Sinuses/Orbits: Bilateral lens surgery. Paranasal sinuses are clear. Other: Partial empty sella, unchanged since 2011. IMPRESSION: Mild chronic white matter ischemic changes. Electronically Signed   By: KPedro EarlsM.D.   On: 04/07/2021 16:45       Assessment & Plan:   Problem List Items Addressed This Visit     [place]ortic atherosclerosis [place]    Continue pravastatin.       Depression    Continues on prozac.  Recently started on cymbalta.  Feels needs to increase the dose.  Increase cymbalta to 635mq day.  Follow.        Relevant Medications   DULoxetine (CYMBALTA) 60 MG capsule   Dysuria - Primary    Check urine to confirm no infection.       Relevant Orders  POCT urinalysis dipstick (Completed)   Urine Microscopic Only (Completed)   Urine Culture (Completed)   GERD (gastroesophageal reflux disease)    No upper symptoms reported.  Continue prilosec.        Hypercholesterolemia    Low cholesterol diet and exercise.  Continue pravastatin.  Seeing Catie - praluent. Follow lipid panel and liver function tests.   Lab Results  Component Value Date   HGBA1C 5.7 11/09/2021       Relevant Orders   Lipid panel   Hyperglycemia    Low carb diet and exercise.  Follow met b and a1c.  Lab Results  Component Value Date   HGBA1C 5.7  11/09/2021       Relevant Orders   Hemoglobin A1c   Hepatic function panel   Hypertension    Continue hctz.  Follow pressures.        Relevant Orders   Basic metabolic panel   Personal history of colonic polyps    Saw Dr Bary Castilla.  Recommended cologuard. Recommended holding colonoscopy until 2025-2026.        Other Visit Diagnoses     B12 deficiency       Relevant Medications   cyanocobalamin ((VITAMIN B-12)) injection 1,000 mcg (Completed)        Einar Pheasant, MD

## 2022-01-15 LAB — URINE CULTURE
MICRO NUMBER:: 12987224
Result:: NO GROWTH
SPECIMEN QUALITY:: ADEQUATE

## 2022-01-18 ENCOUNTER — Encounter: Payer: Self-pay | Admitting: Internal Medicine

## 2022-01-18 DIAGNOSIS — R300 Dysuria: Secondary | ICD-10-CM | POA: Insufficient documentation

## 2022-01-18 NOTE — Assessment & Plan Note (Signed)
Low cholesterol diet and exercise.  Continue pravastatin.  Seeing [facility] - praluent. Follow lipid panel and liver function tests.   Lab Results  Component Value Date   HGBA1C 5.7 11/09/2021

## 2022-01-18 NOTE — Assessment & Plan Note (Signed)
No upper symptoms reported.  Continue prilosec.  

## 2022-01-18 NOTE — Assessment & Plan Note (Signed)
Saw Dr Byrnett.  Recommended cologuard. Recommended holding colonoscopy until 2025-2026.   

## 2022-01-18 NOTE — Assessment & Plan Note (Signed)
Check urine to confirm no infection.  

## 2022-01-18 NOTE — Assessment & Plan Note (Signed)
Continues on prozac.  Recently started on cymbalta.  Feels needs to increase the dose.  Increase cymbalta to 60mg  q day.  Follow.

## 2022-01-18 NOTE — Assessment & Plan Note (Signed)
Continue hctz.  Follow pressures.

## 2022-01-18 NOTE — Assessment & Plan Note (Signed)
Continue pravastatin 

## 2022-01-18 NOTE — Assessment & Plan Note (Signed)
Low carb diet and exercise.  Follow met b and a1c.  Lab Results  Component Value Date   HGBA1C 5.7 11/09/2021   

## 2022-01-19 ENCOUNTER — Other Ambulatory Visit: Payer: Self-pay | Admitting: Internal Medicine

## 2022-01-28 ENCOUNTER — Other Ambulatory Visit (HOSPITAL_COMMUNITY): Payer: Self-pay

## 2022-02-04 ENCOUNTER — Other Ambulatory Visit: Payer: Self-pay | Admitting: Internal Medicine

## 2022-02-04 DIAGNOSIS — I10 Essential (primary) hypertension: Secondary | ICD-10-CM

## 2022-02-05 ENCOUNTER — Other Ambulatory Visit: Payer: Self-pay | Admitting: Internal Medicine

## 2022-02-05 DIAGNOSIS — I10 Essential (primary) hypertension: Secondary | ICD-10-CM

## 2022-02-05 NOTE — Telephone Encounter (Signed)
Pt called in stating that she went to her prefer pharmacy to pickup medication (potassium chloride (KLOR-CON) 10 MEQ tablet). Pharmacy advise pt that medication script has not been sent over. Pt is requesting refill on medication. Pt requesting callback  ? ? ?

## 2022-02-09 ENCOUNTER — Telehealth: Payer: Self-pay | Admitting: Internal Medicine

## 2022-02-09 DIAGNOSIS — R350 Frequency of micturition: Secondary | ICD-10-CM

## 2022-02-09 NOTE — Telephone Encounter (Signed)
Pt called in stating that she has frequent urination. Pt stated when she urinates it is warm, it burns and she has to strain to get the pee out. Pt talked to provider about it and she stated if it comes back she will refer pt to a specialist for it. Pt stated there Is no blood in urine. Sent to access nurse ?

## 2022-02-10 NOTE — Telephone Encounter (Signed)
Order placed for urology referral. Someone should be calling her with an appt date and time.  ?

## 2022-02-10 NOTE — Telephone Encounter (Signed)
Patient aware of results.

## 2022-02-10 NOTE — Telephone Encounter (Signed)
Called patient to triage and offered appt. Patient is not having any symptoms today. Patient says last time this happened she left a urine sample and it was clear so urology was discussed. Wants to see Dr Diamantina Providence. Ok to place referral for her?  ?

## 2022-02-12 ENCOUNTER — Ambulatory Visit: Payer: Self-pay | Admitting: Pharmacist

## 2022-02-12 ENCOUNTER — Telehealth: Payer: Medicare Other

## 2022-02-12 NOTE — Chronic Care Management (AMB) (Signed)
?  Chronic Care Management  ? ?Note ? ?02/12/2022 ?Name: TAYVA EASTERDAY MRN: [id number] DOB: June 26, 1956 ? ? ? ?Closing pharmacy CCM case at this time. Patient has clinic contact information for future questions or concerns.  ? ?Catie Darnelle Maffucci, PharmD, [place], CPP ?Clinical Pharmacist ?Therapist, music at [employer] ?[phone] ? ?

## 2022-02-17 ENCOUNTER — Other Ambulatory Visit: Payer: Self-pay | Admitting: Internal Medicine

## 2022-02-17 NOTE — Telephone Encounter (Signed)
Hold on refilling xanax.  Have discussed with Ms Masterson previously ?

## 2022-02-19 ENCOUNTER — Other Ambulatory Visit (HOSPITAL_COMMUNITY): Payer: Self-pay

## 2022-02-20 ENCOUNTER — Other Ambulatory Visit: Payer: Self-pay | Admitting: Internal Medicine

## 2022-02-22 ENCOUNTER — Other Ambulatory Visit: Payer: Self-pay | Admitting: Internal Medicine

## 2022-02-22 DIAGNOSIS — Z1231 Encounter for screening mammogram for malignant neoplasm of breast: Secondary | ICD-10-CM

## 2022-02-22 DIAGNOSIS — R051 Acute cough: Secondary | ICD-10-CM | POA: Diagnosis not present

## 2022-02-22 DIAGNOSIS — J189 Pneumonia, unspecified organism: Secondary | ICD-10-CM | POA: Diagnosis not present

## 2022-02-22 DIAGNOSIS — J019 Acute sinusitis, unspecified: Secondary | ICD-10-CM | POA: Diagnosis not present

## 2022-02-27 ENCOUNTER — Other Ambulatory Visit: Payer: Self-pay | Admitting: Internal Medicine

## 2022-02-27 DIAGNOSIS — I10 Essential (primary) hypertension: Secondary | ICD-10-CM

## 2022-03-01 ENCOUNTER — Other Ambulatory Visit: Payer: Self-pay

## 2022-03-01 ENCOUNTER — Encounter: Payer: Self-pay | Admitting: Urology

## 2022-03-01 ENCOUNTER — Ambulatory Visit (INDEPENDENT_AMBULATORY_CARE_PROVIDER_SITE_OTHER): Payer: Medicare Other | Admitting: Urology

## 2022-03-01 VITALS — BP 110/78 | HR 121 | Ht 65.0 in | Wt 183.0 lb

## 2022-03-01 DIAGNOSIS — N958 Other specified menopausal and perimenopausal disorders: Secondary | ICD-10-CM

## 2022-03-01 DIAGNOSIS — R350 Frequency of micturition: Secondary | ICD-10-CM

## 2022-03-01 DIAGNOSIS — N952 Postmenopausal atrophic vaginitis: Secondary | ICD-10-CM

## 2022-03-01 DIAGNOSIS — N390 Urinary tract infection, site not specified: Secondary | ICD-10-CM

## 2022-03-01 LAB — MICROSCOPIC EXAMINATION: Bacteria, UA: NONE SEEN

## 2022-03-01 LAB — URINALYSIS, COMPLETE
Bilirubin, UA: NEGATIVE
Glucose, UA: NEGATIVE
Ketones, UA: NEGATIVE
Leukocytes,UA: NEGATIVE
Nitrite, UA: NEGATIVE
Protein,UA: NEGATIVE
Specific Gravity, UA: 1.015 (ref 1.005–1.030)
Urobilinogen, Ur: 0.2 mg/dL (ref 0.2–1.0)
pH, UA: 7 (ref 5.0–7.5)

## 2022-03-01 LAB — BLADDER SCAN AMB NON-IMAGING

## 2022-03-01 MED ORDER — ESTRADIOL 0.1 MG/GM VA CREA: TOPICAL_CREAM | VAGINAL | 12 refills | Status: DC

## 2022-03-01 NOTE — Progress Notes (Signed)
? ?03/01/22 ?12:57 PM  ? ?Gwenna A Ho ?08/11/1956 ?[id number] ? ?CC: Urinary symptoms, dysuria, history of UTIs ? ?HPI: ?I saw Ms. Spahr for evaluation of the above issues.  She has had some problems with urinary infections, documented E. coli in September 2022, as well as UTI symptoms in February 2023 when urine culture was negative, however she took an antibiotic prior to having her urine tested.  Her primary urinary complaint is intermittent dysuria, which she self treats with over-the-counter Azo with improvement.  She occasionally has some urgency, and rare episode of urge incontinence, but denies significant stress incontinence or significant frequency.  She is having quite a bit of trouble with hot flashes currently.  She denies any gross hematuria.  She drinks water and 0-calorie ginger ale and tea during the day.  She continues to smoke 1/2 pack/day.  No prior abdominal cross-sectional imaging to review ? ?Urinalysis today is pending, PVR normal at 8 mL. ? ? ?PMH: ?Past Medical History:  ?Diagnosis Date  ? Allergy   ? Anxiety   ? Arthritis   ? Cancer [facility])   ? basal cell nose  ? Chronic back pain   ? degenerative disc disease  ? Degenerative disc disease, lumbar   ? Depression   ? Dyspnea   ? with exertion / pain  ? Gallstones   ? GERD (gastroesophageal reflux disease)   ? Heart murmur   ? followed by PCP  ? Hx of colonic polyp   ? Hx: UTI (urinary tract infection)   ? Hypercholesterolemia   ? Hypertension   ? Panic attacks   ? H/O  ? Wears dentures   ? partial upper and lower  ? ? ?Surgical History: ?Past Surgical History:  ?Procedure Laterality Date  ? ABDOMINAL HYSTERECTOMY  1992  ? partial  ? BACK SURGERY    ? lumbar fusion  ? CATARACT EXTRACTION Bilateral 2010,2011  ? CHOLECYSTECTOMY  04-19-14  ? COLONOSCOPY  07/02/11  ? DR. Byrnett  ? COLONOSCOPY WITH PROPOFOL N/A 09/25/2015  ? Procedure: COLONOSCOPY WITH PROPOFOL;  Surgeon: Lucilla Lame, MD;  Location: [facility];  Service: Endoscopy;   Laterality: N/A;  ? FOOT SURGERY Right 2009  ? MOUTH SURGERY    ? POLYPECTOMY  09/25/2015  ? Procedure: POLYPECTOMY;  Surgeon: Lucilla Lame, MD;  Location: [facility];  Service: Endoscopy;;  ? skin surgery Left 08/27/13  ? Basal cell removal-left nostril  ? TONSILLECTOMY AND ADENOIDECTOMY  1969  ? ?Family History: ?Family History  ?Problem Relation Age of Onset  ? Hyperlipidemia Mother   ? Hypertension Mother   ? Alcohol abuse Father   ? Hyperlipidemia Father   ? Heart disease Father   ? Diabetes Father   ? Hyperlipidemia Sister   ? Lung cancer Brother   ? Hyperlipidemia Brother   ? Breast cancer Neg Hx   ? ? ?Social History:  reports that she quit smoking about 9 months ago. Her smoking use included cigarettes. She started smoking about 2 years ago. She has a 37.00 pack-year smoking history. She has never used smokeless tobacco. She reports current alcohol use of about 1.0 standard drink per week. She reports that she does not use drugs. ? ?Physical Exam: ?BP 110/78 (BP Location: Left Arm, Patient Position: Sitting, Cuff Size: Large)   Pulse (!) 121   Ht '5\' 5"'$  (1.651 m)   Wt 183 lb (83 kg)   BMI 30.45 kg/m?   ? ?Constitutional:  Alert and oriented, No acute  distress. ?Cardiovascular: No clubbing, cyanosis, or edema. ?Respiratory: Normal respiratory effort, no increased work of breathing. ?GI: Abdomen is soft, nontender, nondistended, no abdominal masses ? ?Laboratory Data: ?Reviewed, see HPI ? ?Assessment & Plan:   ?66 year old female with recurrent UTIs as well as intermittent dysuria of unclear etiology.  She does have a smoking history.  Recent urinalysis was benign with no microscopic hematuria, however she took an antibiotic prior to giving the sample.  She denies any gross hematuria.  We discussed the evaluation and treatment of patients with recurrent UTIs at length.  We specifically discussed the differences between asymptomatic bacteriuria and true urinary tract infection.  We discussed the AUA  definition of recurrent UTI of at least 2 culture proven symptomatic acute cystitis episodes in a 76-monthperiod, or 3 within a 1 year period.  We discussed the importance of culture directed antibiotic treatment, and antibiotic stewardship.  First-line therapy includes nitrofurantoin(5 days), Bactrim(3 days), or fosfomycin(3 g single dose).  Possible etiologies of recurrent infection include periurethral tissue atrophy in postmenopausal woman, constipation, sexual activity, incomplete emptying, anatomic abnormalities, and even genetic predisposition.  Finally, we discussed the role of perineal hygiene, timed voiding, adequate hydration, topical vaginal estrogen, cranberry prophylaxis, and low-dose antibiotic prophylaxis. ? ?We also discussed possible diagnosis of genitourinary syndrome of menopause, and that topical estrogen cream is typically a first-line treatment.  I think she would benefit from this in terms of her intermittent dysuria, as well as with her recurrent infections. ? ?Finally, if she was found to have any microscopic hematuria or pyuria on urinalysis today, would recommend cystoscopy for further evaluation with her smoking history. ? ?Topical estrogen cream, instructions given ?Call with urinalysis results, proceed with cystoscopy if microscopic hematuria or pyuria ?RTC 4 months symptom check ? ?BNickolas Madrid MD ?03/01/2022 ? ?[place]?[address] ?[place] [address]?[phone] ? ?

## 2022-03-02 DIAGNOSIS — H02834 Dermatochalasis of left upper eyelid: Secondary | ICD-10-CM | POA: Diagnosis not present

## 2022-03-02 DIAGNOSIS — H02831 Dermatochalasis of right upper eyelid: Secondary | ICD-10-CM | POA: Diagnosis not present

## 2022-03-02 MED ORDER — HYDROCHLOROTHIAZIDE 12.5 MG PO CAPS: 12.5000 mg | ORAL_CAPSULE | Freq: Every day | ORAL | 0 refills | Status: DC

## 2022-03-17 ENCOUNTER — Other Ambulatory Visit: Payer: Self-pay | Admitting: Internal Medicine

## 2022-03-17 ENCOUNTER — Other Ambulatory Visit: Payer: Self-pay | Admitting: *Deleted

## 2022-03-17 ENCOUNTER — Other Ambulatory Visit (HOSPITAL_COMMUNITY): Payer: Self-pay

## 2022-03-17 DIAGNOSIS — Z122 Encounter for screening for malignant neoplasm of respiratory organs: Secondary | ICD-10-CM

## 2022-03-17 DIAGNOSIS — F17210 Nicotine dependence, cigarettes, uncomplicated: Secondary | ICD-10-CM

## 2022-03-17 DIAGNOSIS — Z87891 Personal history of nicotine dependence: Secondary | ICD-10-CM

## 2022-03-17 DIAGNOSIS — E7800 Pure hypercholesterolemia, unspecified: Secondary | ICD-10-CM

## 2022-03-17 MED ORDER — PRALUENT 75 MG/ML ~~LOC~~ SOAJ
75.0000 mg | SUBCUTANEOUS | 1 refills | Status: DC
  Filled 2022-03-17: qty 6, 84d supply, fill #0
  Filled 2022-06-21: qty 6, 84d supply, fill #1

## 2022-03-18 ENCOUNTER — Other Ambulatory Visit (HOSPITAL_COMMUNITY): Payer: Self-pay

## 2022-03-19 ENCOUNTER — Ambulatory Visit (INDEPENDENT_AMBULATORY_CARE_PROVIDER_SITE_OTHER): Payer: Medicare Other

## 2022-03-19 VITALS — Ht 65.0 in | Wt 183.0 lb

## 2022-03-19 DIAGNOSIS — Z0000 Encounter for general adult medical examination without abnormal findings: Secondary | ICD-10-CM

## 2022-03-19 NOTE — Patient Instructions (Addendum)
?  Ms. Angel Riddle , ?Thank you for taking time to come for your Medicare Wellness Visit. I appreciate your ongoing commitment to your health goals. Please review the following plan we discussed and let me know if I can assist you in the future.  ? ?These are the goals we discussed: ? Goals   ? ?  Maintain Healthy Lifestyle   ?  Stay active ?Healthy diet ?  ? ?  ?  ?This is a list of the screening recommended for you and due dates:  ?Health Maintenance  ?Topic Date Due  ? DEXA scan (bone density measurement)  Never done  ? Mammogram  03/11/2022  ? Zoster (Shingles) Vaccine (1 of 2) 04/13/2022*  ? Pneumonia Vaccine (1 - PCV) 01/14/2023*  ? Colon Cancer Screening  01/14/2023*  ? Flu Shot  07/06/2022  ? Tetanus Vaccine  06/15/2028  ? Hepatitis C Screening: USPSTF Recommendation to screen - Ages 73-79 yo.  Completed  ? HPV Vaccine  Aged Out  ? COVID-19 Vaccine  Discontinued  ?*Topic was postponed. The date shown is not the original due date.  ?  ?

## 2022-03-19 NOTE — Progress Notes (Signed)
Subjective:   Angel Riddle is a 66 y.o. female who presents for an Initial Medicare Annual Wellness Visit.  Review of Systems    No ROS.  Medicare Wellness Virtual Visit.  Visual/audio telehealth visit, UTA vital signs.   See social history for additional risk factors.   Cardiac Risk Factors include: advanced age (>75men, >13 women)     Objective:    Today's Vitals   03/19/22 1231  Weight: 183 lb (83 kg)  Height: 5\' 5"  (1.651 m)   Body mass index is 30.45 kg/m.     03/19/2022   12:41 PM 07/11/2018    2:26 PM 09/18/2017    6:53 AM 09/16/2017    8:00 AM 09/08/2017   11:34 AM 07/29/2017    9:25 PM 09/25/2015    9:55 AM  Advanced Directives  Does Patient Have a Medical Advance Directive? Yes Yes Yes Yes Yes No   Type of Estate agent of [place];Living will  Healthcare Power of [place];Living will Healthcare Power of [place];Living will Healthcare Power of [place];Living will  Living will  Does patient want to make changes to medical advance directive? No - Patient declined   No - Patient declined     Copy of Healthcare Power of Attorney in Chart? No - copy requested  No - copy requested No - copy requested No - copy requested  No - copy requested  Would patient like information on creating a medical advance directive?      No - Patient declined     Current Medications (verified) Outpatient Encounter Medications as of 03/19/2022  Medication Sig   albuterol (PROVENTIL) (2.5 MG/3ML) 0.083% nebulizer solution Inhale into the lungs.   albuterol (VENTOLIN HFA) 108 (90 Base) MCG/ACT inhaler Inhale into the lungs.   Alirocumab (PRALUENT) 75 MG/ML SOAJ Inject 1 pen (75 mg) into the skin every 14 days.   ALPRAZolam (XANAX) 0.5 MG tablet Take 1 tablet (0.5 mg total) by mouth daily as needed for anxiety.   aspirin EC 81 MG tablet Take 1 tablet (81 mg total) by mouth daily. Swallow whole.   DULoxetine (CYMBALTA) 60 MG capsule Take 1 capsule (60 mg total) by  mouth daily.   estradiol (ESTRACE) 0.1 MG/GM vaginal cream Estrogen Cream Instruction Discard applicator Apply pea sized amount to tip of finger to urethra before bed. Wash hands well after application. Use Monday, Wednesday and Friday   FLUoxetine (PROZAC) 40 MG capsule Take 1 capsule (40 mg total) by mouth daily.   hydrochlorothiazide (MICROZIDE) 12.5 MG capsule Take 1 capsule (12.5 mg total) by mouth daily.   HYDROcodone-acetaminophen (NORCO) 7.5-325 MG tablet Take 1.5 tablets by mouth as needed. Using 1-2 times daily   meloxicam (MOBIC) 15 MG tablet Take 1 tablet (15 mg total) by mouth daily.   pantoprazole (PROTONIX) 40 MG tablet Take 1 tablet (40 mg total) by mouth daily.   potassium chloride (KLOR-CON) 10 MEQ tablet Take 1 tablet (10 mEq total) by mouth daily.   pravastatin (PRAVACHOL) 10 MG tablet Take 1 tablet (10 mg total) by mouth daily.   [DISCONTINUED] amoxicillin-clavulanate (AUGMENTIN) 875-125 MG tablet Take 1 tablet by mouth 2 (two) times daily.   [DISCONTINUED] azithromycin (ZITHROMAX) 250 MG tablet Take 250 mg by mouth as directed.   No facility-administered encounter medications on file as of 03/19/2022.    Allergies (verified) Doxycycline, Gadolinium, Gadolinium derivatives, Iodinated contrast media, Levofloxacin, Silicone, Levaquin [levofloxacin in d5w], and Adhesive [tape]   History: Past Medical History:  Diagnosis  Date   Allergy    Anxiety    Arthritis    Cancer (HCC)    basal cell nose   Chronic back pain    degenerative disc disease   Degenerative disc disease, lumbar    Depression    Dyspnea    with exertion / pain   Gallstones    GERD (gastroesophageal reflux disease)    Heart murmur    followed by PCP   Hx of colonic polyp    Hx: UTI (urinary tract infection)    Hypercholesterolemia    Hypertension    Panic attacks    H/O   Wears dentures    partial upper and lower   Past Surgical History:  Procedure Laterality Date   ABDOMINAL HYSTERECTOMY   1992   partial   BACK SURGERY     lumbar fusion   CATARACT EXTRACTION Bilateral 2010,2011   CHOLECYSTECTOMY  04-19-14   COLONOSCOPY  07/02/11   DR. Byrnett   COLONOSCOPY WITH PROPOFOL N/A 09/25/2015   Procedure: COLONOSCOPY WITH PROPOFOL;  Surgeon: Midge Minium, MD;  Location: [facility];  Service: Endoscopy;  Laterality: N/A;   FOOT SURGERY Right 2009   MOUTH SURGERY     POLYPECTOMY  09/25/2015   Procedure: POLYPECTOMY;  Surgeon: Midge Minium, MD;  Location: [facility];  Service: Endoscopy;;   skin surgery Left 08/27/13   Basal cell removal-left nostril   TONSILLECTOMY AND ADENOIDECTOMY  1969   Family History  Problem Relation Age of Onset   Hyperlipidemia Mother    Hypertension Mother    Alcohol abuse Father    Hyperlipidemia Father    Heart disease Father    Diabetes Father    Hyperlipidemia Sister    Lung cancer Brother    Hyperlipidemia Brother    Breast cancer Neg Hx    Social History   Socioeconomic History   Marital status: Married    Spouse name: Not on file   Number of children: 2   Years of education: Not on file   Highest education level: Not on file  Occupational History   Not on file  Tobacco Use   Smoking status: Former    Packs/day: 1.00    Years: 37.00    Pack years: 37.00    Types: Cigarettes    Start date: 08/07/2019    Quit date: 05/25/2021    Years since quitting: 0.8   Smokeless tobacco: Never  Vaping Use   Vaping Use: Former  Substance and Sexual Activity   Alcohol use: Yes    Alcohol/week: 1.0 standard drink    Types: 1 Glasses of wine per week   Drug use: No   Sexual activity: Not on file  Other Topics Concern   Not on file  Social History Narrative   Not on file   Social Determinants of Health   Financial Resource Strain: Low Risk    Difficulty of Paying Living Expenses: Not very hard  Food Insecurity: No Food Insecurity   Worried About Running Out of Food in the Last Year: Never true   Ran Out of Food in the  Last Year: Never true  Transportation Needs: No Transportation Needs   Lack of Transportation (Medical): No   Lack of Transportation (Non-Medical): No  Physical Activity: Not on file  Stress: Stress Concern Present   Feeling of Stress : To some extent  Social Connections: Unknown   Frequency of Communication with Friends and Family: Not on file   Frequency of  Social Gatherings with Friends and Family: Not on file   Attends Religious Services: Not on file   Active Member of Clubs or Organizations: Not on file   Attends Banker Meetings: Not on file   Marital Status: Married    Tobacco Counseling Counseling given: Not Answered   Clinical Intake:  Pre-visit preparation completed: Yes        Diabetes: No  How often do you need to have someone help you when you read instructions, pamphlets, or other written materials from your doctor or pharmacy?: 1 - Never  Interpreter Needed?: No    Activities of Daily Living    03/19/2022   12:42 PM  In your present state of health, do you have any difficulty performing the following activities:  Hearing? 0  Vision? 0  Difficulty concentrating or making decisions? 0  Walking or climbing stairs? 0  Dressing or bathing? 0  Doing errands, shopping? 0  Preparing Food and eating ? N  Using the Toilet? N  In the past six months, have you accidently leaked urine? N  Do you have problems with loss of bowel control? N  Managing your Medications? N  Managing your Finances? N  Housekeeping or managing your Housekeeping? N   Patient Care Team: Dale Sparks, MD as PCP - General (Internal Medicine) Dale Brookside, MD (Internal Medicine) Lemar Livings Merrily Pew, MD (General Surgery)  Indicate any recent Medical Services you may have received from other than [facility] providers in the past year (date may be approximate).     Assessment:   This is a routine wellness examination for Angel Riddle.  Virtual Visit via Telephone Note  I  connected with  Angel Riddle on 03/19/22 at 12:30 PM EDT by telephone and verified that I am speaking with the correct person using two identifiers.  Persons participating in the virtual visit: patient/Nurse Health Advisor   I discussed the limitations of performing an evaluation and management service by telehealth.  The patient expressed understanding and agreed to proceed. We continued and completed visit with audio only. Some vital signs may be absent or patient reported.   Hearing/Vision screen Hearing Screening - Comments:: Patient is able to hear conversational tones without difficulty.  No issues reported. Vision Screening - Comments:: Followed by Dr. Larence Penning Wears corrective lenses Cataract extraction, bilateral They have seen their ophthalmologist in the last 12 months.   Dietary issues and exercise activities discussed: Current Exercise Habits: Home exercise routine, Intensity: Mild Regular diet Good water intake   Goals Addressed             This Visit's Progress    Maintain Healthy Lifestyle       Stay active Healthy diet       Depression Screen    03/19/2022   12:37 PM 11/11/2021   10:55 AM 08/11/2021   11:28 AM 07/04/2020    2:20 PM 12/25/2019    2:44 PM 07/02/2019   10:28 AM 08/26/2016    2:28 PM  PHQ 2/9 Scores  PHQ - 2 Score 0 0 0 1  1 0  PHQ- 9 Score    4     Exception Documentation     Other- indicate reason in comment box    Not completed     under increased stress. discuss with pcp      Fall Risk    03/19/2022   12:42 PM 11/11/2021   10:55 AM 08/11/2021   11:28 AM 07/04/2020    1:17 PM  05/21/2020    2:07 PM  Fall Risk   Falls in the past year? 0 0 0 0 0  Number falls in past yr: 0 0 0  0  Injury with Fall?  0 0  0  Risk for fall due to :  No Fall Risks     Follow up Falls evaluation completed Falls evaluation completed Falls evaluation completed Falls evaluation completed Falls evaluation completed    FALL RISK PREVENTION PERTAINING TO THE  HOME: Home free of loose throw rugs in walkways, pet beds, electrical cords, etc? Yes  Adequate lighting in your home to reduce risk of falls? Yes   ASSISTIVE DEVICES UTILIZED TO PREVENT FALLS: Life alert? No  Use of a cane, walker or w/c? No   TIMED UP AND GO: Was the test performed? No .   Cognitive Function:  Patient is alert and oriented x3.       Immunizations Immunization History  Administered Date(s) Administered   Fluad Quad(high Dose 65+) 08/11/2021   Influenza,inj,Quad PF,6+ Mos 09/05/2013, 08/05/2014, 08/20/2015, 08/23/2019   Influenza-Unspecified 09/11/2016, 09/03/2017, 08/21/2018, 11/07/2020   Tdap 06/15/2018   Bone density- plans to call and schedule. Notes she has the contact number for scheduling. Order placed 08/2021.   Screening Tests Health Maintenance  Topic Date Due   DEXA SCAN  Never done   MAMMOGRAM  03/11/2022   Zoster Vaccines- Shingrix (1 of 2) 04/13/2022 (Originally 02/04/2006)   Pneumonia Vaccine 32+ Years old (1 - PCV) 01/14/2023 (Originally [birth year]/04/1962)   COLONOSCOPY (Pts 45-80yrs Insurance coverage will need to be confirmed)  01/14/2023 (Originally 09/24/2020)   INFLUENZA VACCINE  07/06/2022   TETANUS/TDAP  06/15/2028   Hepatitis C Screening  Completed   HPV VACCINES  Aged Out   COVID-19 Vaccine  Discontinued   Health Maintenance Health Maintenance Due  Topic Date Due   DEXA SCAN  Never done   MAMMOGRAM  03/11/2022   Mammogram scheduled- 03/30/22  Lung Cancer Screening: scheduled 03/29/22. Notes spot found on xray at Urgent Care radiology. Antibiotics completed just incase it was pneumonia. Reports cough still present.   Next fasting lab 04/01/22 and f/u with PCP 04/05/22.  Vision Screening: Recommended annual ophthalmology exams for early detection of glaucoma and other disorders of the eye.  Dental Screening: Recommended annual dental exams for proper oral hygiene  Community Resource Referral / Chronic Care Management: CRR required this  visit?  No   CCM required this visit?  No      Plan:   Keep all routine maintenance appointments.   I have personally reviewed and noted the following in the patient's chart:   Medical and social history Use of alcohol, tobacco or illicit drugs  Current medications and supplements including opioid prescriptions. Patient is currently taking opioid prescriptions. Information provided to patient regarding non-opioid alternatives. Patient advised to discuss non-opioid treatment plan with their provider. Followed every 6 weeks by Orthopedic Surgery. Functional ability and status Nutritional status Physical activity Advanced directives List of other physicians Hospitalizations, surgeries, and ER visits in previous 12 months Vitals Screenings to include cognitive, depression, and falls Referrals and appointments  In addition, I have reviewed and discussed with patient certain preventive protocols, quality metrics, and best practice recommendations. A written personalized care plan for preventive services as well as general preventive health recommendations were provided to patient.     Ashok Pall, LPN   1/61/0960

## 2022-03-29 ENCOUNTER — Ambulatory Visit
Admission: RE | Admit: 2022-03-29 | Discharge: 2022-03-29 | Disposition: A | Discharge status: Home / Self Care | Payer: Medicare Other | Source: Ambulatory Visit | Attending: Acute Care | Admitting: Acute Care

## 2022-03-29 DIAGNOSIS — Z87891 Personal history of nicotine dependence: Secondary | ICD-10-CM | POA: Diagnosis not present

## 2022-03-29 DIAGNOSIS — Z122 Encounter for screening for malignant neoplasm of respiratory organs: Secondary | ICD-10-CM | POA: Diagnosis not present

## 2022-03-29 DIAGNOSIS — J439 Emphysema, unspecified: Secondary | ICD-10-CM | POA: Diagnosis not present

## 2022-03-29 DIAGNOSIS — F17210 Nicotine dependence, cigarettes, uncomplicated: Secondary | ICD-10-CM | POA: Diagnosis not present

## 2022-03-29 DIAGNOSIS — I700 Atherosclerosis of aorta: Secondary | ICD-10-CM | POA: Insufficient documentation

## 2022-03-30 ENCOUNTER — Telehealth: Payer: Self-pay | Admitting: Internal Medicine

## 2022-03-30 ENCOUNTER — Ambulatory Visit
Admission: RE | Admit: 2022-03-30 | Discharge: 2022-03-30 | Disposition: A | Discharge status: Home / Self Care | Payer: Medicare Other | Source: Ambulatory Visit | Attending: Internal Medicine | Admitting: Internal Medicine

## 2022-03-30 DIAGNOSIS — Z1231 Encounter for screening mammogram for malignant neoplasm of breast: Secondary | ICD-10-CM | POA: Diagnosis not present

## 2022-03-30 NOTE — Telephone Encounter (Signed)
Called Pt... LVMTCB... Provider will be out of office on 04/05/2022... Called around 9:15am...  ?

## 2022-04-01 ENCOUNTER — Other Ambulatory Visit: Payer: Medicare Other

## 2022-04-05 ENCOUNTER — Other Ambulatory Visit (INDEPENDENT_AMBULATORY_CARE_PROVIDER_SITE_OTHER): Payer: Medicare Other

## 2022-04-05 ENCOUNTER — Ambulatory Visit: Payer: Medicare Other | Admitting: Internal Medicine

## 2022-04-05 DIAGNOSIS — E7800 Pure hypercholesterolemia, unspecified: Secondary | ICD-10-CM | POA: Diagnosis not present

## 2022-04-05 DIAGNOSIS — R739 Hyperglycemia, unspecified: Secondary | ICD-10-CM

## 2022-04-05 DIAGNOSIS — I10 Essential (primary) hypertension: Secondary | ICD-10-CM

## 2022-04-05 LAB — LIPID PANEL
Cholesterol: 194 mg/dL (ref 0–200)
HDL: 57.2 mg/dL (ref >39.00)
LDL Cholesterol: 97 mg/dL (ref 0–99)
NonHDL: 136.51
Total CHOL/HDL Ratio: 3
Triglycerides: 196 mg/dL — ABNORMAL HIGH (ref 0.0–149.0)
VLDL: 39.2 mg/dL (ref 0.0–40.0)

## 2022-04-05 LAB — HEPATIC FUNCTION PANEL
ALT: 9 U/L (ref 0–35)
AST: 22 U/L (ref 0–37)
Albumin: 4.5 g/dL (ref 3.5–5.2)
Alkaline Phosphatase: 123 U/L — ABNORMAL HIGH (ref 39–117)
Bilirubin, Direct: 0.1 mg/dL (ref 0.0–0.3)
Total Bilirubin: 0.6 mg/dL (ref 0.2–1.2)
Total Protein: 7.4 g/dL (ref 6.0–8.3)

## 2022-04-05 LAB — BASIC METABOLIC PANEL
BUN: 16 mg/dL (ref 6–23)
CO2: 26 mEq/L (ref 19–32)
Calcium: 9.3 mg/dL (ref 8.4–10.5)
Chloride: 98 mEq/L (ref 96–112)
Creatinine, Ser: 0.86 mg/dL (ref 0.40–1.20)
GFR: 70.52 mL/min (ref >60.00)
Glucose, Bld: 88 mg/dL (ref 70–99)
Potassium: 3.9 mEq/L (ref 3.5–5.1)
Sodium: 137 mEq/L (ref 135–145)

## 2022-04-05 LAB — HEMOGLOBIN A1C: Hgb A1c MFr Bld: 6 % (ref 4.6–6.5)

## 2022-04-06 ENCOUNTER — Ambulatory Visit (INDEPENDENT_AMBULATORY_CARE_PROVIDER_SITE_OTHER): Payer: Medicare Other | Admitting: Internal Medicine

## 2022-04-06 ENCOUNTER — Encounter: Payer: Self-pay | Admitting: Internal Medicine

## 2022-04-06 DIAGNOSIS — I700 Atherosclerosis of aorta: Secondary | ICD-10-CM | POA: Diagnosis not present

## 2022-04-06 DIAGNOSIS — Z860100 Personal history of colon polyps, unspecified: Secondary | ICD-10-CM

## 2022-04-06 DIAGNOSIS — R000 Tachycardia, unspecified: Secondary | ICD-10-CM | POA: Diagnosis not present

## 2022-04-06 DIAGNOSIS — K219 Gastro-esophageal reflux disease without esophagitis: Secondary | ICD-10-CM | POA: Diagnosis not present

## 2022-04-06 DIAGNOSIS — M549 Dorsalgia, unspecified: Secondary | ICD-10-CM | POA: Diagnosis not present

## 2022-04-06 DIAGNOSIS — I10 Essential (primary) hypertension: Secondary | ICD-10-CM | POA: Diagnosis not present

## 2022-04-06 DIAGNOSIS — F320 Major depressive disorder, single episode, mild: Secondary | ICD-10-CM | POA: Diagnosis not present

## 2022-04-06 DIAGNOSIS — G8929 Other chronic pain: Secondary | ICD-10-CM | POA: Diagnosis not present

## 2022-04-06 DIAGNOSIS — E7800 Pure hypercholesterolemia, unspecified: Secondary | ICD-10-CM | POA: Diagnosis not present

## 2022-04-06 DIAGNOSIS — R739 Hyperglycemia, unspecified: Secondary | ICD-10-CM

## 2022-04-06 NOTE — Progress Notes (Signed)
Patient ID: Angel Riddle, female   DOB: 01-16-1956, 66 y.o.   MRN: [id number] ? ? ?Subjective:  ? ? Patient ID: Angel Riddle, female    DOB: December 30, 1955, 66 y.o.   MRN: [id number] ? ?This visit occurred during the SARS-CoV-2 public health emergency.  Safety protocols were in place, including screening questions prior to the visit, additional usage of staff PPE, and extensive cleaning of exam room while observing appropriate contact time as indicated for disinfecting solutions.  ? ?Patient here for a scheduled follow up.  ? ?Chief Complaint  ?Patient presents with  ? Follow-up  ?  8wk follow up  ? .  ? ?HPI ?Follow up regarding increased stress, cholesterol and her back issues.  Increased back pain recently.  Mostly localized - left lower back - into left hip and down into left upper leg.  No chest pain.  Has noticed some increased sob.  Notices after has been up and moving - then sits down - winded. Increased sweats.  No syncope or near syncope.  No abdominal pain.  Bowels moving.  No acid reflux reported.  Discussed labs.  Increased stress - husband's health issues.  Was questioning if needed to increased cymbalta.   ? ? ?Past Medical History:  ?Diagnosis Date  ? Allergy   ? Anxiety   ? Arthritis   ? Cancer [facility])   ? basal cell nose  ? Chronic back pain   ? degenerative disc disease  ? Degenerative disc disease, lumbar   ? Depression   ? Dyspnea   ? with exertion / pain  ? Gallstones   ? GERD (gastroesophageal reflux disease)   ? Heart murmur   ? followed by PCP  ? Hx of colonic polyp   ? Hx: UTI (urinary tract infection)   ? Hypercholesterolemia   ? Hypertension   ? Panic attacks   ? H/O  ? Wears dentures   ? partial upper and lower  ? ?Past Surgical History:  ?Procedure Laterality Date  ? ABDOMINAL HYSTERECTOMY  1992  ? partial  ? BACK SURGERY    ? lumbar fusion  ? CATARACT EXTRACTION Bilateral 2010,2011  ? CHOLECYSTECTOMY  04-19-14  ? COLONOSCOPY  07/02/11  ? DR. Byrnett  ? COLONOSCOPY WITH PROPOFOL N/A  09/25/2015  ? Procedure: COLONOSCOPY WITH PROPOFOL;  Surgeon: Lucilla Lame, MD;  Location: [place];  Service: Endoscopy;  Laterality: N/A;  ? FOOT SURGERY Right 2009  ? MOUTH SURGERY    ? POLYPECTOMY  09/25/2015  ? Procedure: POLYPECTOMY;  Surgeon: Lucilla Lame, MD;  Location: [place];  Service: Endoscopy;;  ? skin surgery Left 08/27/13  ? Basal cell removal-left nostril  ? TONSILLECTOMY AND ADENOIDECTOMY  1969  ? ?Family History  ?Problem Relation Age of Onset  ? Hyperlipidemia Mother   ? Hypertension Mother   ? Alcohol abuse Father   ? Hyperlipidemia Father   ? Heart disease Father   ? Diabetes Father   ? Hyperlipidemia Sister   ? Lung cancer Brother   ? Hyperlipidemia Brother   ? Breast cancer Neg Hx   ? ?Social History  ? ?Socioeconomic History  ? Marital status: Married  ?  Spouse name: Not on file  ? Number of children: 2  ? Years of education: Not on file  ? Highest education level: Not on file  ?Occupational History  ? Not on file  ?Tobacco Use  ? Smoking status: Former  ?  Packs/day: 1.00  ?  Years:  37.00  ?  Pack years: 37.00  ?  Types: Cigarettes  ?  Start date: 08/07/2019  ?  Quit date: 05/25/2021  ?  Years since quitting: 0.8  ? Smokeless tobacco: Never  ?Vaping Use  ? Vaping Use: Former  ?Substance and Sexual Activity  ? Alcohol use: Yes  ?  Alcohol/week: 1.0 standard drink  ?  Types: 1 Glasses of wine per week  ? Drug use: No  ? Sexual activity: Not on file  ?Other Topics Concern  ? Not on file  ?Social History Narrative  ? Not on file  ? ?Social Determinants of Health  ? ?Financial Resource Strain: Low Risk   ? Difficulty of Paying Living Expenses: Not very hard  ?Food Insecurity: No Food Insecurity  ? Worried About Charity fundraiser in the Last Year: Never true  ? Ran Out of Food in the Last Year: Never true  ?Transportation Needs: No Transportation Needs  ? Lack of Transportation (Medical): No  ? Lack of Transportation (Non-Medical): No  ?Physical Activity: Not on file  ?Stress:  Stress Concern Present  ? Feeling of Stress : To some extent  ?Social Connections: Unknown  ? Frequency of Communication with Friends and Family: Not on file  ? Frequency of Social Gatherings with Friends and Family: Not on file  ? Attends Religious Services: Not on file  ? Active Member of Clubs or Organizations: Not on file  ? Attends Archivist Meetings: Not on file  ? Marital Status: Married  ? ? ? ?Review of Systems  ?Constitutional:  Negative for appetite change and unexpected weight change.  ?HENT:  Negative for congestion and sinus pressure.   ?Respiratory:  Negative for cough and chest tightness.   ?     SOB as outlined.   ?Cardiovascular:  Negative for chest pain, palpitations and leg swelling.  ?Gastrointestinal:  Negative for abdominal pain, diarrhea, nausea and vomiting.  ?Genitourinary:  Negative for difficulty urinating and dysuria.  ?Musculoskeletal:  Positive for back pain. Negative for joint swelling and myalgias.  ?Skin:  Negative for color change and rash.  ?Neurological:  Negative for dizziness, light-headedness and headaches.  ?Psychiatric/Behavioral:  Negative for agitation and dysphoric mood.   ?     Increased stress as outlined.   ? ?   ?Objective:  ?  ? ?BP (!) 142/86 (BP Location: Left Arm, Patient Position: Sitting, Cuff Size: Small)   Pulse (!) 120   Temp 98.9 ?F (37.2 ?C) (Temporal)   Resp 18   Ht '5\' 5"'$  (1.651 m)   Wt 179 lb (81.2 kg)   SpO2 97%   BMI 29.79 kg/m?  ?Wt Readings from Last 3 Encounters:  ?04/06/22 179 lb (81.2 kg)  ?03/29/22 183 lb (83 kg)  ?03/19/22 183 lb (83 kg)  ? ? ?Physical Exam ?Vitals reviewed.  ?Constitutional:   ?   General: She is not in acute distress. ?   Appearance: Normal appearance.  ?HENT:  ?   Head: Normocephalic and atraumatic.  ?   Right Ear: External ear normal.  ?   Left Ear: External ear normal.  ?Eyes:  ?   General: No scleral icterus.    ?   Right eye: No discharge.     ?   Left eye: No discharge.  ?   Conjunctiva/sclera:  Conjunctivae normal.  ?Neck:  ?   Thyroid: No thyromegaly.  ?Cardiovascular:  ?   Rate and Rhythm: Normal rate and regular rhythm.  ?Pulmonary:  ?  Effort: No respiratory distress.  ?   Breath sounds: Normal breath sounds. No wheezing.  ?Abdominal:  ?   General: Bowel sounds are normal.  ?   Palpations: Abdomen is soft.  ?   Tenderness: There is no abdominal tenderness.  ?Musculoskeletal:     ?   General: No swelling or tenderness.  ?   Cervical back: Neck supple. No tenderness.  ?Lymphadenopathy:  ?   Cervical: No cervical adenopathy.  ?Skin: ?   Findings: No erythema or rash.  ?Neurological:  ?   Mental Status: She is alert.  ?Psychiatric:     ?   Mood and Affect: Mood normal.     ?   Behavior: Behavior normal.  ? ? ? ?Outpatient Encounter Medications as of 04/06/2022  ?Medication Sig  ? albuterol (PROVENTIL) (2.5 MG/3ML) 0.083% nebulizer solution Inhale into the lungs.  ? albuterol (VENTOLIN HFA) 108 (90 Base) MCG/ACT inhaler Inhale into the lungs.  ? Alirocumab (PRALUENT) 75 MG/ML SOAJ Inject 1 pen (75 mg) into the skin every 14 days.  ? ALPRAZolam (XANAX) 0.5 MG tablet Take 1 tablet (0.5 mg total) by mouth daily as needed for anxiety.  ? aspirin EC 81 MG tablet Take 1 tablet (81 mg total) by mouth daily. Swallow whole.  ? DULoxetine (CYMBALTA) 60 MG capsule Take 1 capsule (60 mg total) by mouth daily.  ? estradiol (ESTRACE) 0.1 MG/GM vaginal cream Estrogen Cream Instruction Discard applicator Apply pea sized amount to tip of finger to urethra before bed. Wash hands well after application. Use Monday, Wednesday and Friday  ? FLUoxetine (PROZAC) 40 MG capsule Take 1 capsule (40 mg total) by mouth daily.  ? hydrochlorothiazide (MICROZIDE) 12.5 MG capsule Take 1 capsule (12.5 mg total) by mouth daily.  ? HYDROcodone-acetaminophen (NORCO) 7.5-325 MG tablet Take 1.5 tablets by mouth as needed. Using 1-2 times daily  ? meloxicam (MOBIC) 15 MG tablet Take 1 tablet (15 mg total) by mouth daily.  ? pantoprazole (PROTONIX)  40 MG tablet Take 1 tablet (40 mg total) by mouth daily.  ? potassium chloride (KLOR-CON) 10 MEQ tablet Take 1 tablet (10 mEq total) by mouth daily.  ? pravastatin (PRAVACHOL) 10 MG tablet Take 1 tablet (10 mg total) by m

## 2022-04-13 ENCOUNTER — Encounter: Payer: Self-pay | Admitting: Cardiovascular Disease

## 2022-04-13 ENCOUNTER — Encounter: Payer: Self-pay | Admitting: Internal Medicine

## 2022-04-13 ENCOUNTER — Ambulatory Visit (INDEPENDENT_AMBULATORY_CARE_PROVIDER_SITE_OTHER): Payer: Medicare Other | Admitting: Cardiovascular Disease

## 2022-04-13 VITALS — BP 140/100 | HR 96 | Ht 65.0 in | Wt 181.1 lb

## 2022-04-13 DIAGNOSIS — R000 Tachycardia, unspecified: Secondary | ICD-10-CM | POA: Diagnosis not present

## 2022-04-13 DIAGNOSIS — R0602 Shortness of breath: Secondary | ICD-10-CM

## 2022-04-13 MED ORDER — METOPROLOL TARTRATE 25 MG PO TABS: 25.0000 mg | ORAL_TABLET | Freq: Every day | ORAL | 3 refills | Status: DC

## 2022-04-13 NOTE — Assessment & Plan Note (Signed)
S/p previous surgery. Persistent pain.  Seeing [facility]. Some increased low back pain. Due to f/u with [facility] this month.  ?

## 2022-04-13 NOTE — Assessment & Plan Note (Signed)
Low carb diet and exercise.  Follow met b and a1c.  Lab Results  Component Value Date   HGBA1C 6.0 04/05/2022   

## 2022-04-13 NOTE — Assessment & Plan Note (Signed)
Continue pravastatin 

## 2022-04-13 NOTE — Assessment & Plan Note (Signed)
Continues on prozac and cymbalta.  Increased cymbalta to '60mg'$  q day last visit.  Was questioning need to increase the dose again. Discussed given increased stress with husband's health issues and given on prozac and cymbalta with persistent issues, the need for psychiatry evaluation - to help with treatment, medication management, etc.  She is limiting xanax usage.   ?

## 2022-04-13 NOTE — Assessment & Plan Note (Addendum)
Low cholesterol diet and exercise.  Continue pravastatin.  Seeing Catie - praluent. Follow lipid panel and liver function tests.   ?Lab Results  ?Component Value Date  ? CHOL 194 04/05/2022  ? HDL 57.20 04/05/2022  ? Joyce 97 04/05/2022  ? LDLDIRECT 158.0 03/05/2021  ? TRIG 196.0 (H) 04/05/2022  ? CHOLHDL 3 04/05/2022  ? ?

## 2022-04-13 NOTE — Progress Notes (Signed)
Cardiology Office Note ? ?Date:  04/13/2022  ? ?Angel Riddle, DOB [birth year], MRN [id number] ? ?PCP:  Einar Pheasant, MD  ? ?Chief Complaint  ?Patient presents with  ? New Patient (Initial Visit)  ?  Ref by Dr. Einar Pheasant, MD for Tachycardia. Medications reviewed by the patient verbally,. Patient c/o tired, shortness of breath at times, cramping in feet and legs at night and breaks out into a sweat most evenings.   ? ? ?HPI:  ?[name] is a 66 yo woman with  ?h/o persistent back pain.  Takes hydrocodone 2/day.   ?Oct 2018 back surgery ? right shoulder.  S/p injection.  ?increased stress/anxiety ?HTN ?Hyperlipidemia ?Smoker, 37 yrs, quit 2012 ?Who presents by referral from Dr. Nicki Reaper for consultation of her chest pain symptoms, tachycardia ? ?LOV 02/2018 ?On discussion today she reports significant stress at home, husband with medical issues ?He had CVA ?Personality change, she is having to do much more to take care of him ? ?Seen by PMD, heart rate elevated, 111 on EKG, sinus tach vitals recording heart rate 120 ?Some SOB on exertion ?Smoking again, <1 ppd ? ?On prozac and cymbalta ?Winded at night after a busy day ? ?Poor sleep, cramps in feet and legs ?No regular exercise program ? ?Active in church on Tuesday and Wednesday, finds it very enjoyable ? ?EKG personally reviewed by myself on todays visit ?Nsr rate 96 bpm, no ST or Tw[place] changes ? ?Other past medical history reviewed ?CT chest 12/26/2017 ?No carotid calcification ?Minimal  aortic atherosclerosis all the way down to the kidneys ?No significant coronary calcification ? ?Rare panic attacks,  ? ?2004 cath, done at [facility], no known disease ? ?total cholesterol 300 in January 2019 ? ? ? ?PMH:   has a past medical history of Allergy, Anxiety, Arthritis, Cancer ([place]), Chronic back pain, Degenerative disc disease, lumbar, Depression, Dyspnea, Gallstones, GERD (gastroesophageal reflux disease), Heart murmur, colonic polyp, UTI (urinary tract  infection), Hypercholesterolemia, Hypertension, Panic attacks, and Wears dentures. ? ?PSH:    ?Past Surgical History:  ?Procedure Laterality Date  ? ABDOMINAL HYSTERECTOMY  1992  ? partial  ? BACK SURGERY    ? lumbar fusion  ? CATARACT EXTRACTION Bilateral 2010,2011  ? CHOLECYSTECTOMY  04-19-14  ? COLONOSCOPY  07/02/11  ? DR. Byrnett  ? COLONOSCOPY WITH PROPOFOL N/A 09/25/2015  ? Procedure: COLONOSCOPY WITH PROPOFOL;  Surgeon: Lucilla Lame, MD;  Location: [place];  Service: Endoscopy;  Laterality: N/A;  ? FOOT SURGERY Right 2009  ? MOUTH SURGERY    ? POLYPECTOMY  09/25/2015  ? Procedure: POLYPECTOMY;  Surgeon: Lucilla Lame, MD;  Location: [place];  Service: Endoscopy;;  ? skin surgery Left 08/27/13  ? Basal cell removal-left nostril  ? TONSILLECTOMY AND ADENOIDECTOMY  1969  ? ? ?Current Outpatient Medications  ?Medication Sig Dispense Refill  ? albuterol (PROVENTIL) (2.5 MG/3ML) 0.083% nebulizer solution Inhale into the lungs.    ? albuterol (VENTOLIN HFA) 108 (90 Base) MCG/ACT inhaler Inhale into the lungs.    ? Alirocumab (PRALUENT) 75 MG/ML SOAJ Inject 1 pen (75 mg) into the skin every 14 days. 6 mL 1  ? ALPRAZolam (XANAX) 0.5 MG tablet Take 1 tablet (0.5 mg total) by mouth daily as needed for anxiety. 20 tablet 0  ? aspirin EC 81 MG tablet Take 1 tablet (81 mg total) by mouth daily. Swallow whole. 30 tablet 0  ? DULoxetine (CYMBALTA) 60 MG capsule Take 1 capsule (60 mg total) by  mouth daily. 30 capsule 2  ? estradiol (ESTRACE) 0.1 MG/GM vaginal cream Estrogen Cream Instruction Discard applicator Apply pea sized amount to tip of finger to urethra before bed. Wash hands well after application. Use Monday, Wednesday and Friday 42.5 g 12  ? FLUoxetine (PROZAC) 40 MG capsule Take 1 capsule (40 mg total) by mouth daily. 90 capsule 0  ? hydrochlorothiazide (MICROZIDE) 12.5 MG capsule Take 1 capsule (12.5 mg total) by mouth daily. 90 capsule 0  ? HYDROcodone-acetaminophen (NORCO) 7.5-325 MG tablet  Take 1.5 tablets by mouth as needed. Using 1-2 times daily    ? meloxicam (MOBIC) 15 MG tablet Take 1 tablet (15 mg total) by mouth daily. 90 tablet 0  ? pantoprazole (PROTONIX) 40 MG tablet Take 1 tablet (40 mg total) by mouth daily. 30 tablet 5  ? potassium chloride (KLOR-CON) 10 MEQ tablet Take 1 tablet (10 mEq total) by mouth daily. 90 tablet 0  ? pravastatin (PRAVACHOL) 10 MG tablet Take 1 tablet (10 mg total) by mouth daily. 90 tablet 1  ? ?No current facility-administered medications for this visit.  ? ? ? ?Allergies:   Doxycycline, Gadolinium, Gadolinium derivatives, Iodinated contrast media, Levofloxacin, Silicone, Levaquin [levofloxacin in d5w], and Adhesive [tape]  ? ?Social History:  The patient  reports that she has been smoking cigarettes. She started smoking about 2 years ago. She has a 37.00 pack-year smoking history. She has never used smokeless tobacco. She reports current alcohol use of about 1.0 standard drink per week. She reports that she does not use drugs.  ? ?Family History:   family history includes Alcohol abuse in her father; Diabetes in her father; Heart disease in her father; Hyperlipidemia in her brother, father, mother, and sister; Hypertension in her mother; Lung cancer in her brother.  ? ? ?Review of Systems: ?Review of Systems  ?Constitutional: Negative.   ?HENT: Negative.    ?Respiratory:  Positive for shortness of breath.   ?Cardiovascular: Negative.   ?Gastrointestinal: Negative.   ?Musculoskeletal: Negative.   ?Neurological: Negative.   ?Psychiatric/Behavioral:  The patient is nervous/anxious.   ?All other systems reviewed and are negative. ? ? ?PHYSICAL EXAM: ?VS:  BP (!) 140/100 (BP Location: Right Arm, Patient Position: Sitting, Cuff Size: Normal)   Pulse 96   Ht '5\' 5"'$  (1.651 m)   Wt 181 lb 2 oz (82.2 kg)   SpO2 98%   BMI 30.14 kg/m?  , BMI Body mass index is 30.14 kg/m?Marland Kitchen ?Constitutional:  oriented to person, place, and time. No distress.  ?HENT:  ?Head: Grossly  normal ?Eyes:  no discharge. No scleral icterus.  ?Neck: No JVD, no carotid bruits  ?Cardiovascular: Regular rate and rhythm, no murmurs appreciated ?Pulmonary/Chest: Clear to auscultation bilaterally, no wheezes or rails ?Abdominal: Soft.  no distension.  no tenderness.  ?Musculoskeletal: Normal range of motion ?Neurological:  normal muscle tone. Coordination normal. No atrophy ?Skin: Skin warm and dry ?Psychiatric: normal affect, pleasant ? ?Recent Labs: ?07/24/2021: Hemoglobin 13.6; Platelets 309.0 ?11/09/2021: TSH 1.35 ?04/05/2022: ALT 9; BUN 16; Creatinine, Ser 0.86; Potassium 3.9; Sodium 137  ? ? ?Lipid Panel ?Lab Results  ?Component Value Date  ? CHOL 194 04/05/2022  ? HDL 57.20 04/05/2022  ? Roy 97 04/05/2022  ? TRIG 196.0 (H) 04/05/2022  ? ?  ? ?Wt Readings from Last 3 Encounters:  ?04/13/22 181 lb 2 oz (82.2 kg)  ?04/06/22 179 lb (81.2 kg)  ?03/29/22 183 lb (83 kg)  ?  ? ?ASSESSMENT AND PLAN: ? ?Sinus tachycardia ?Likely  driven by stress/anxiety ?Echocardiogram ordered for baseline study given mild shortness of breath on exertion ?Less likely arrhythmia ?We did add metoprolol succinate 25 daily for blood pressure and rate ? ?Aortic atherosclerosis ([place]) - Plan: EKG 12-Lead ?Minimal aortic atherosclerosis seen on CT scan ?On PCSK9 inhibitor ? ?Hypercholesterolemia - Plan: EKG 12-Lead ?Reports tolerating PCSK9 inhibitor ?Could have a holiday from the pravastatin given cramping in her legs and feet at nighttime affecting her sleep ?If statin myalgias could try Zetia instead with the Praluent ? ?Essential hypertension ?Metoprolol succinate 25 added as above ? ?Smoker ?Reported that she stopped smoking many years ago ?little aortic atherosclerosis on prior CT scan ?May be contributing to her shortness of breath ? ?Anxiety ?Episodes of panic attacks in the past ?Followed by therapist ? on Prozac and cymbalta ?Contyributing to sinus tachycardia ?Recommend she needs time for herself, regular walking program for  conditioning ? ? ? Total encounter time more than 60 minutes ? Greater than 50% was spent in counseling and coordination of care with the patient ? ? ?No orders of the defined types were placed in this encounter. ? ? ? ?Si

## 2022-04-13 NOTE — Assessment & Plan Note (Signed)
Saw Dr Byrnett.  Recommended cologuard. Recommended holding colonoscopy until 2025-2026.   

## 2022-04-13 NOTE — Assessment & Plan Note (Signed)
Increased heart rate noted on exam.  SOB as outlined.  EKG - SR/ST - ventricular rate 111.  No acute ischemic changes.  Given symptoms and risk factors, discussed further cardiac evaluation.  She is in agreement.  Refer to cardiology.   ?

## 2022-04-13 NOTE — Patient Instructions (Addendum)
Medication Instructions:  ?Please start metoprolol succinate 25 mg daily ? ?Ok to do a trial hold of the pravastatin ?If cramps better, you could start zetia 10 mg daily ? ?If you need a refill on your cardiac medications before your next appointment, please call your pharmacy.  ? ?Lab work: ?No new labs needed ? ?Testing/Procedures: ? ?Your physician has requested that you have an echocardiogram. Echocardiography is a painless test that uses sound waves to create images of your heart. It provides your doctor with information about the size and shape of your heart and how well your heart?s chambers and valves are working. This procedure takes approximately one hour. There are no restrictions for this procedure.  ? ?Follow-Up: ?At [facility], you and your health needs are our priority.  As part of our continuing mission to provide you with exceptional heart care, we have created designated Provider Care Teams.  These Care Teams include your primary Cardiologist (physician) and Advanced Practice Providers (APPs -  Physician Assistants and Nurse Practitioners) who all work together to provide you with the care you need, when you need it. ? ?You will need a follow up appointment as needed ? ?Providers on your designated Care Team:   ?Murray Hodgkins, NP ?Christell Faith, PA-C ?Cadence Kathlen Mody, PA-C ? ?COVID-19 Vaccine Information can be found at: [email or web address].uk For questions related to vaccine distribution or appointments, please email [email or web address] or call [phone].  ? ?

## 2022-04-13 NOTE — Assessment & Plan Note (Signed)
No upper symptoms reported.  Continue prilosec.  

## 2022-04-13 NOTE — Assessment & Plan Note (Signed)
Continue hctz.  Follow pressures.   ?

## 2022-04-20 ENCOUNTER — Other Ambulatory Visit: Payer: Self-pay | Admitting: Internal Medicine

## 2022-04-22 ENCOUNTER — Other Ambulatory Visit: Payer: Self-pay | Admitting: Acute Care

## 2022-04-22 DIAGNOSIS — Z122 Encounter for screening for malignant neoplasm of respiratory organs: Secondary | ICD-10-CM

## 2022-04-22 DIAGNOSIS — F17210 Nicotine dependence, cigarettes, uncomplicated: Secondary | ICD-10-CM

## 2022-04-22 DIAGNOSIS — Z87891 Personal history of nicotine dependence: Secondary | ICD-10-CM

## 2022-05-04 DIAGNOSIS — H43813 Vitreous degeneration, bilateral: Secondary | ICD-10-CM | POA: Diagnosis not present

## 2022-05-04 DIAGNOSIS — H31092 Other chorioretinal scars, left eye: Secondary | ICD-10-CM | POA: Diagnosis not present

## 2022-05-04 DIAGNOSIS — Z961 Presence of intraocular lens: Secondary | ICD-10-CM | POA: Diagnosis not present

## 2022-05-04 DIAGNOSIS — H2101 Hyphema, right eye: Secondary | ICD-10-CM | POA: Diagnosis not present

## 2022-05-13 ENCOUNTER — Other Ambulatory Visit: Payer: Self-pay | Admitting: Internal Medicine

## 2022-05-13 ENCOUNTER — Ambulatory Visit (INDEPENDENT_AMBULATORY_CARE_PROVIDER_SITE_OTHER): Payer: Medicare Other

## 2022-05-13 DIAGNOSIS — R0602 Shortness of breath: Secondary | ICD-10-CM | POA: Diagnosis not present

## 2022-05-13 DIAGNOSIS — R000 Tachycardia, unspecified: Secondary | ICD-10-CM

## 2022-05-13 LAB — ECHOCARDIOGRAM COMPLETE
AR max vel: 2.19 cm2
AV Area VTI: 1.97 cm2
AV Area mean vel: 1.99 cm2
AV Mean grad: 2 mmHg
AV Peak grad: 3.7 mmHg
Ao pk vel: 0.96 m/s
Area-P 1/2: 3.66 cm2
Calc EF: 55.5 %
S' Lateral: 2.9 cm
Single Plane A2C EF: 53 %
Single Plane A4C EF: 58.4 %

## 2022-05-14 ENCOUNTER — Telehealth: Payer: Self-pay | Admitting: Internal Medicine

## 2022-05-14 ENCOUNTER — Other Ambulatory Visit: Payer: Self-pay

## 2022-05-14 NOTE — Telephone Encounter (Signed)
Patient is requesting a refill on her ALPRAZolam (XANAX) 0.5 MG tablet. °

## 2022-05-14 NOTE — Telephone Encounter (Signed)
Sent to Dr Scott for signature 

## 2022-05-14 NOTE — Telephone Encounter (Signed)
Rx ok'd for cymbalta #30 with 2 refills.

## 2022-05-15 MED ORDER — ALPRAZOLAM 0.5 MG PO TABS: 0.5000 mg | ORAL_TABLET | Freq: Every day | ORAL | 0 refills | Status: DC | PRN

## 2022-05-17 ENCOUNTER — Other Ambulatory Visit: Payer: Self-pay | Admitting: Internal Medicine

## 2022-05-17 DIAGNOSIS — I10 Essential (primary) hypertension: Secondary | ICD-10-CM

## 2022-05-18 DIAGNOSIS — Z6830 Body mass index (BMI) 30.0-30.9, adult: Secondary | ICD-10-CM | POA: Diagnosis not present

## 2022-05-18 DIAGNOSIS — M5416 Radiculopathy, lumbar region: Secondary | ICD-10-CM | POA: Diagnosis not present

## 2022-05-28 ENCOUNTER — Other Ambulatory Visit: Payer: Self-pay | Admitting: Internal Medicine

## 2022-05-28 DIAGNOSIS — I700 Atherosclerosis of aorta: Secondary | ICD-10-CM

## 2022-06-03 ENCOUNTER — Encounter: Payer: Self-pay | Admitting: Internal Medicine

## 2022-06-03 ENCOUNTER — Ambulatory Visit (INDEPENDENT_AMBULATORY_CARE_PROVIDER_SITE_OTHER): Payer: Medicare Other | Admitting: Internal Medicine

## 2022-06-03 DIAGNOSIS — E7800 Pure hypercholesterolemia, unspecified: Secondary | ICD-10-CM | POA: Diagnosis not present

## 2022-06-03 DIAGNOSIS — F320 Major depressive disorder, single episode, mild: Secondary | ICD-10-CM

## 2022-06-03 DIAGNOSIS — Z86010 Personal history of colonic polyps: Secondary | ICD-10-CM

## 2022-06-03 DIAGNOSIS — Z713 Dietary counseling and surveillance: Secondary | ICD-10-CM

## 2022-06-03 DIAGNOSIS — F419 Anxiety disorder, unspecified: Secondary | ICD-10-CM

## 2022-06-03 DIAGNOSIS — M549 Dorsalgia, unspecified: Secondary | ICD-10-CM

## 2022-06-03 DIAGNOSIS — R000 Tachycardia, unspecified: Secondary | ICD-10-CM | POA: Diagnosis not present

## 2022-06-03 DIAGNOSIS — I10 Essential (primary) hypertension: Secondary | ICD-10-CM | POA: Diagnosis not present

## 2022-06-03 DIAGNOSIS — I700 Atherosclerosis of aorta: Secondary | ICD-10-CM | POA: Diagnosis not present

## 2022-06-03 DIAGNOSIS — G8929 Other chronic pain: Secondary | ICD-10-CM | POA: Diagnosis not present

## 2022-06-03 DIAGNOSIS — R739 Hyperglycemia, unspecified: Secondary | ICD-10-CM | POA: Diagnosis not present

## 2022-06-03 DIAGNOSIS — K219 Gastro-esophageal reflux disease without esophagitis: Secondary | ICD-10-CM | POA: Diagnosis not present

## 2022-06-03 MED ORDER — WEGOVY 0.25 MG/0.5ML ~~LOC~~ SOAJ: 0.2500 mg | SUBCUTANEOUS | 2 refills | Status: DC

## 2022-06-03 NOTE — Progress Notes (Signed)
Patient ID: Angel Riddle, female   DOB: 1956-08-20, 66 y.o.   MRN: [id number]   Subjective:    Patient ID: Angel Riddle, female    DOB: [birth year], 66 y.o.   MRN: [id number]   Patient here for a scheduled follow up.   Chief Complaint  Patient presents with   Depression   Hypertension   .   HPI Evaluated by Dr Rockey Situ 04/13/22 for tachycardia.  Started on metoprolol.  Had echo - EF 60-65%, mild LVH, Grade 1 DD and trivial MR.  Still with increased stress.  Discussed.  Is planning to f/u with psychiatry.  On cymbalta.  Only uses xanax prn.  Trying to stay active.  Discussed restarting water aerobics.  No chest pain or sob reported.  No abdominal pain.  Had questions about ozempic - weight loss medication.  BMI 30.     Past Medical History:  Diagnosis Date   Allergy    Anxiety    Arthritis    Cancer ([place])    basal cell nose   Chronic back pain    degenerative disc disease   Degenerative disc disease, lumbar    Depression    Dyspnea    with exertion / pain   Gallstones    GERD (gastroesophageal reflux disease)    Heart murmur    followed by PCP   Hx of colonic polyp    Hx: UTI (urinary tract infection)    Hypercholesterolemia    Hypertension    Panic attacks    H/O   Wears dentures    partial upper and lower   Past Surgical History:  Procedure Laterality Date   ABDOMINAL HYSTERECTOMY  1992   partial   BACK SURGERY     lumbar fusion   CATARACT EXTRACTION Bilateral 2010,2011   CHOLECYSTECTOMY  04-19-14   COLONOSCOPY  07/02/11   DR. Byrnett   COLONOSCOPY WITH PROPOFOL N/A 09/25/2015   Procedure: COLONOSCOPY WITH PROPOFOL;  Surgeon: Lucilla Lame, MD;  Location: [place];  Service: Endoscopy;  Laterality: N/A;   FOOT SURGERY Right 2009   MOUTH SURGERY     POLYPECTOMY  09/25/2015   Procedure: POLYPECTOMY;  Surgeon: Lucilla Lame, MD;  Location: [place];  Service: Endoscopy;;   skin surgery Left 08/27/13   Basal cell removal-left nostril    TONSILLECTOMY AND ADENOIDECTOMY  1969   Family History  Problem Relation Age of Onset   Hyperlipidemia Mother    Hypertension Mother    Alcohol abuse Father    Hyperlipidemia Father    Heart disease Father    Diabetes Father    Hyperlipidemia Sister    Lung cancer Brother    Hyperlipidemia Brother    Breast cancer Neg Hx    Social History   Socioeconomic History   Marital status: Married    Spouse name: Not on file   Number of children: 2   Years of education: Not on file   Highest education level: Not on file  Occupational History   Not on file  Tobacco Use   Smoking status: Every Day    Packs/day: 1.00    Years: 37.00    Total pack years: 37.00    Types: Cigarettes    Start date: 08/07/2019    Last attempt to quit: 05/25/2021    Years since quitting: 1.0   Smokeless tobacco: Never   Tobacco comments:    Smokes 1/2 PPD   Vaping Use   Vaping Use: Former  Substance  and Sexual Activity   Alcohol use: Yes    Alcohol/week: 1.0 standard drink of alcohol    Types: 1 Glasses of wine per week   Drug use: No   Sexual activity: Not on file  Other Topics Concern   Not on file  Social History Narrative   Not on file   Social Determinants of Health   Financial Resource Strain: Low Risk  (03/19/2022)   Overall Financial Resource Strain (CARDIA)    Difficulty of Paying Living Expenses: Not very hard  Food Insecurity: No Food Insecurity (03/19/2022)   Hunger Vital Sign    Worried About Running Out of Food in the Last Year: Never true    Ran Out of Food in the Last Year: Never true  Transportation Needs: No Transportation Needs (03/19/2022)   PRAPARE - Hydrologist (Medical): No    Lack of Transportation (Non-Medical): No  Physical Activity: Not on file  Stress: Stress Concern Present (03/19/2022)   [place]    Feeling of Stress : To some extent  Social Connections: Unknown  (03/19/2022)   Social Connection and Isolation Panel [NHANES]    Frequency of Communication with Friends and Family: Not on file    Frequency of Social Gatherings with Friends and Family: Not on file    Attends Religious Services: Not on file    Active Member of Clubs or Organizations: Not on file    Attends Archivist Meetings: Not on file    Marital Status: Married     Review of Systems  Constitutional:  Negative for appetite change and unexpected weight change.  HENT:  Negative for congestion and sinus pressure.   Respiratory:  Negative for cough, chest tightness and shortness of breath.   Cardiovascular:  Negative for chest pain, palpitations and leg swelling.  Gastrointestinal:  Negative for abdominal pain, diarrhea, nausea and vomiting.  Genitourinary:  Negative for difficulty urinating and dysuria.  Musculoskeletal:  Positive for back pain. Negative for joint swelling and myalgias.  Skin:  Negative for color change and rash.  Neurological:  Negative for dizziness, light-headedness and headaches.  Psychiatric/Behavioral:  Negative for agitation and dysphoric mood.        Increased stress as outlined.        Objective:     BP 120/70 (BP Location: Left Arm, Patient Position: Sitting, Cuff Size: Small)   Pulse 80   Temp 98 F (36.7 C) (Temporal)   Resp (!) 80   Ht 5' 5" (1.651 m)   Wt 184 lb (83.5 kg)   SpO2 98%   BMI 30.62 kg/m  Wt Readings from Last 3 Encounters:  06/03/22 184 lb (83.5 kg)  04/13/22 181 lb 2 oz (82.2 kg)  04/06/22 179 lb (81.2 kg)    Physical Exam Vitals reviewed.  Constitutional:      General: She is not in acute distress.    Appearance: Normal appearance.  HENT:     Head: Normocephalic and atraumatic.     Right Ear: External ear normal.     Left Ear: External ear normal.  Eyes:     General: No scleral icterus.       Right eye: No discharge.        Left eye: No discharge.     Conjunctiva/sclera: Conjunctivae normal.  Neck:      Thyroid: No thyromegaly.  Cardiovascular:     Rate and Rhythm: Normal rate and regular  rhythm.  Pulmonary:     Effort: No respiratory distress.     Breath sounds: Normal breath sounds. No wheezing.  Abdominal:     General: Bowel sounds are normal.     Palpations: Abdomen is soft.     Tenderness: There is no abdominal tenderness.  Musculoskeletal:        General: No swelling or tenderness.     Cervical back: Neck supple. No tenderness.  Lymphadenopathy:     Cervical: No cervical adenopathy.  Skin:    Findings: No erythema or rash.  Neurological:     Mental Status: She is alert.  Psychiatric:        Mood and Affect: Mood normal.        Behavior: Behavior normal.      Outpatient Encounter Medications as of 06/03/2022  Medication Sig   albuterol (PROVENTIL) (2.5 MG/3ML) 0.083% nebulizer solution Inhale into the lungs.   albuterol (VENTOLIN HFA) 108 (90 Base) MCG/ACT inhaler Inhale into the lungs.   Alirocumab (PRALUENT) 75 MG/ML SOAJ Inject 1 pen (75 mg) into the skin every 14 days.   ALPRAZolam (XANAX) 0.5 MG tablet Take 1 tablet (0.5 mg total) by mouth daily as needed for anxiety.   aspirin EC 81 MG tablet Take 1 tablet (81 mg total) by mouth daily. Swallow whole.   DULoxetine (CYMBALTA) 60 MG capsule Take 1 capsule (60 mg total) by mouth daily.   estradiol (ESTRACE) 0.1 MG/GM vaginal cream Estrogen Cream Instruction Discard applicator Apply pea sized amount to tip of finger to urethra before bed. Wash hands well after application. Use Monday, Wednesday and Friday   FLUoxetine (PROZAC) 40 MG capsule Take 1 capsule (40 mg total) by mouth daily.   hydrochlorothiazide (MICROZIDE) 12.5 MG capsule Take 1 capsule (12.5 mg total) by mouth daily.   HYDROcodone-acetaminophen (NORCO) 7.5-325 MG tablet Take 1.5 tablets by mouth as needed. Using 1-2 times daily   meloxicam (MOBIC) 15 MG tablet Take 1 tablet (15 mg total) by mouth daily.   metoprolol tartrate (LOPRESSOR) 25 MG tablet Take 1  tablet (25 mg total) by mouth daily.   pantoprazole (PROTONIX) 40 MG tablet Take 1 tablet (40 mg total) by mouth daily.   potassium chloride (KLOR-CON) 10 MEQ tablet Take 1 tablet (10 mEq total) by mouth daily.   pravastatin (PRAVACHOL) 10 MG tablet Take 1 tablet (10 mg total) by mouth daily.   Semaglutide-Weight Management (WEGOVY) 0.25 MG/0.5ML SOAJ Inject 0.25 mg into the skin once a week.   No facility-administered encounter medications on file as of 06/03/2022.     Lab Results  Component Value Date   WBC 8.5 07/24/2021   HGB 13.6 07/24/2021   HCT 41.2 07/24/2021   PLT 309.0 07/24/2021   GLUCOSE 88 04/05/2022   CHOL 194 04/05/2022   TRIG 196.0 (H) 04/05/2022   HDL 57.20 04/05/2022   LDLDIRECT 158.0 03/05/2021   LDLCALC 97 04/05/2022   ALT 9 04/05/2022   AST 22 04/05/2022   NA 137 04/05/2022   K 3.9 04/05/2022   CL 98 04/05/2022   CREATININE 0.86 04/05/2022   BUN 16 04/05/2022   CO2 26 04/05/2022   TSH 1.35 11/09/2021   HGBA1C 6.0 04/05/2022    MM 3D SCREEN BREAST BILATERAL  Result Date: 03/31/2022 CLINICAL DATA:  Screening. EXAM: DIGITAL SCREENING BILATERAL MAMMOGRAM WITH TOMOSYNTHESIS AND CAD TECHNIQUE: Bilateral screening digital craniocaudal and mediolateral oblique mammograms were obtained. Bilateral screening digital breast tomosynthesis was performed. The images were evaluated with computer-aided detection.  COMPARISON:  Previous exam(s). ACR Breast Density Category b: There are scattered areas of fibroglandular density. FINDINGS: There are no findings suspicious for malignancy. IMPRESSION: No mammographic evidence of malignancy. A result letter of this screening mammogram will be mailed directly to the patient. RECOMMENDATION: Screening mammogram in one year. (Code:SM-B-01Y) BI-RADS CATEGORY  1: Negative. Electronically Signed   By: Dorise Bullion III M.D.   On: 03/31/2022 09:18       Assessment & Plan:   Problem List Items Addressed This Visit     Anxiety    On  cymbalta and prozac.   Follow closely.  Previously tried buspar and gabapentin - did not feel helped.  I gave her xanax .42m #20 to use sparingly.  F/u with psych as discussed        Aortic atherosclerosis ([facility])    Continue pravastatin.       Chronic back pain    S/p previous surgery. Persistent pain.  Seeing [facility].       Depression, major, single episode, mild ([place]    Continues on prozac and cymbalta. Given increased stress with husband's health issues and given on prozac and cymbalta with persistent issues, the need for psychiatry evaluation - to help with treatment, medication management, etc has been discussed and she was agreeable.  She is limiting xanax usage.  F/u with psych as discussed.       GERD (gastroesophageal reflux disease)    No upper symptoms reported.  Continue prilosec.        History of colonic polyps    Saw Dr BBary Castilla  Recommended cologuard. Recommended holding colonoscopy until 2025-2026.        Hypercholesterolemia    Low cholesterol diet and exercise.  Continue pravastatin and praluent.  Follow lipid panel and liver function tests.   Lab Results  Component Value Date   CHOL 194 04/05/2022   HDL 57.20 04/05/2022   LDLCALC 97 04/05/2022   LDLDIRECT 158.0 03/05/2021   TRIG 196.0 (H) 04/05/2022   CHOLHDL 3 04/05/2022       Relevant Orders   CBC with Differential/Platelet   Hepatic function panel   Lipid panel   Hyperglycemia    Low carb diet and exercise.  Follow met b and a1c.  Lab Results  Component Value Date   HGBA1C 6.0 04/05/2022       Relevant Orders   Hemoglobin A1c   Hypertension    Continue hctz.  Follow pressures.  Now on metoprolol.  Follow.       Relevant Orders   Basic metabolic panel   Tachycardia    ECHO as outlined. On metoprolol.  Follow.       Weight loss counseling, encounter for    Discussed with her today.  Discussed weight loss options.  Pre diabetes.  She is interested in GLP 1 agonist. Discussed wegovy.   Discussed possible side effects.  No issues with pancreas, gallbladder, etc.  rx for wegovy sent in to pharmacy.           CEinar Pheasant MD

## 2022-06-05 ENCOUNTER — Encounter: Payer: Self-pay | Admitting: Internal Medicine

## 2022-06-05 NOTE — Assessment & Plan Note (Signed)
Discussed with her today.  Discussed weight loss options.  Pre diabetes.  She is interested in GLP 1 agonist. Discussed wegovy.  Discussed possible side effects.  No issues with pancreas, gallbladder, etc.  rx for wegovy sent in to pharmacy.

## 2022-06-05 NOTE — Assessment & Plan Note (Signed)
Low carb diet and exercise.  Follow met b and a1c.  Lab Results  Component Value Date   HGBA1C 6.0 04/05/2022

## 2022-06-05 NOTE — Assessment & Plan Note (Signed)
ECHO as outlined. On metoprolol.  Follow.

## 2022-06-05 NOTE — Assessment & Plan Note (Signed)
Low cholesterol diet and exercise.  Continue pravastatin and praluent.  Follow lipid panel and liver function tests.   Lab Results  Component Value Date   CHOL 194 04/05/2022   HDL 57.20 04/05/2022   LDLCALC 97 04/05/2022   LDLDIRECT 158.0 03/05/2021   TRIG 196.0 (H) 04/05/2022   CHOLHDL 3 04/05/2022

## 2022-06-05 NOTE — Assessment & Plan Note (Signed)
On cymbalta and prozac.   Follow closely.  Previously tried buspar and gabapentin - did not feel helped.  I gave her xanax .'5mg'$  #20 to use sparingly.  F/u with psych as discussed

## 2022-06-05 NOTE — Assessment & Plan Note (Signed)
Continue hctz.  Follow pressures.  Now on metoprolol.  Follow.

## 2022-06-05 NOTE — Assessment & Plan Note (Signed)
No upper symptoms reported.  Continue prilosec.  

## 2022-06-05 NOTE — Assessment & Plan Note (Signed)
Continues on prozac and cymbalta. Given increased stress with husband's health issues and given on prozac and cymbalta with persistent issues, the need for psychiatry evaluation - to help with treatment, medication management, etc has been discussed and she was agreeable.  She is limiting xanax usage.  F/u with psych as discussed.

## 2022-06-05 NOTE — Assessment & Plan Note (Signed)
Continue pravastatin 

## 2022-06-05 NOTE — Assessment & Plan Note (Signed)
Saw Dr Byrnett.  Recommended cologuard. Recommended holding colonoscopy until 2025-2026.   

## 2022-06-05 NOTE — Assessment & Plan Note (Signed)
S/p previous surgery. Persistent pain.  Seeing [facility].  

## 2022-06-10 ENCOUNTER — Telehealth: Payer: Self-pay

## 2022-06-10 NOTE — Telephone Encounter (Signed)
Patient is following-up on request for weight loss medication.  I let patient know that her prescription for Semaglutide-Weight Management (WEGOVY) 0.25 MG/0.5ML SOAJ was sent to her pharmacy on 06/03/2022.  Patient states she will follow-up with her pharmacy.

## 2022-06-21 ENCOUNTER — Other Ambulatory Visit: Payer: Self-pay

## 2022-06-21 ENCOUNTER — Telehealth: Payer: Self-pay | Admitting: Internal Medicine

## 2022-06-21 ENCOUNTER — Other Ambulatory Visit (HOSPITAL_COMMUNITY): Payer: Self-pay

## 2022-06-21 DIAGNOSIS — E7800 Pure hypercholesterolemia, unspecified: Secondary | ICD-10-CM

## 2022-06-21 MED ORDER — PRALUENT 75 MG/ML ~~LOC~~ SOAJ
75.0000 mg | SUBCUTANEOUS | 1 refills | Status: DC
  Filled 2022-06-21 – 2022-06-22 (×2): qty 6, 84d supply, fill #0

## 2022-06-21 NOTE — Telephone Encounter (Signed)
Refill Praluent sent to [facility] w/ grant information attached as previous fills were sent.  PA needed for [facility] - will start

## 2022-06-21 NOTE — Telephone Encounter (Signed)
Notify Angel Riddle.  She does not have diabetes.  Insurance not covering weight loss medication.

## 2022-06-21 NOTE — Telephone Encounter (Signed)
Angel Riddle (Key: [id number]) Rx #: [id number] Wegovy 0.'25MG'$ /0.5ML auto-injectors   Form [employer] Electronic Prior Authorization Request Form (2017 NCPDP) Created 34 minutes ago Sent to Plan 2 minutes ago Plan Response 2 minutes ago Submit Clinical Questions 1 minute ago Determination Wait for Determination Please wait for [name] Medicare 2017 to return a determination.

## 2022-06-21 NOTE — Telephone Encounter (Signed)
Please see Mancel Parsons denial below - drug class is not covered by plan.

## 2022-06-21 NOTE — Telephone Encounter (Signed)
Catie Darnelle Maffucci helped patient get her Alirocumab (PRALUENT) 59 MG/ML SOAJ through patient assistant program. Patient is out and is requesting a refill and if it can go through Patient Assistants Program.  Also, the medication that Dr Nicki Reaper prescribed for weight loss is on [employer] wide back order.

## 2022-06-22 ENCOUNTER — Other Ambulatory Visit (HOSPITAL_COMMUNITY): Payer: Self-pay

## 2022-06-22 NOTE — Telephone Encounter (Signed)
Pt advised of drug class exclusion.  [facility] referral also sent over to Susy Frizzle for [employer] assistance - grant expired per [employer].

## 2022-06-22 NOTE — Telephone Encounter (Signed)
Pt out of Praluent, cost assistance grant expired. [facility] referral sent in per [employer] [facility] for renewal of assistance.  Pt would like to know if you would like her to take the pravastatin in the mean time until we can get her the praluent .

## 2022-06-22 NOTE — Telephone Encounter (Signed)
If can give some idea of how long the process will take, can better determine if needs to start pravastatin.  If just short term, ok to hold.

## 2022-06-22 NOTE — Telephone Encounter (Signed)
Pt advised ok to hold pravastatin for now, if getting medication seems to be taking longer than a few weeks we will have her start.

## 2022-07-01 ENCOUNTER — Ambulatory Visit: Payer: Medicare Other | Admitting: Urology

## 2022-07-12 DIAGNOSIS — M180 Bilateral primary osteoarthritis of first carpometacarpal joints: Secondary | ICD-10-CM | POA: Diagnosis not present

## 2022-07-16 ENCOUNTER — Other Ambulatory Visit (INDEPENDENT_AMBULATORY_CARE_PROVIDER_SITE_OTHER): Payer: Medicare Other

## 2022-07-16 DIAGNOSIS — R739 Hyperglycemia, unspecified: Secondary | ICD-10-CM | POA: Diagnosis not present

## 2022-07-16 DIAGNOSIS — I10 Essential (primary) hypertension: Secondary | ICD-10-CM

## 2022-07-16 DIAGNOSIS — E7800 Pure hypercholesterolemia, unspecified: Secondary | ICD-10-CM

## 2022-07-16 LAB — BASIC METABOLIC PANEL
BUN: 20 mg/dL (ref 6–23)
CO2: 29 mEq/L (ref 19–32)
Calcium: 9.4 mg/dL (ref 8.4–10.5)
Chloride: 100 mEq/L (ref 96–112)
Creatinine, Ser: 1.03 mg/dL (ref 0.40–1.20)
GFR: 56.69 mL/min — ABNORMAL LOW (ref >60.00)
Glucose, Bld: 97 mg/dL (ref 70–99)
Potassium: 3.6 mEq/L (ref 3.5–5.1)
Sodium: 140 mEq/L (ref 135–145)

## 2022-07-16 LAB — HEPATIC FUNCTION PANEL
ALT: 5 U/L (ref 0–35)
AST: 14 U/L (ref 0–37)
Albumin: 4.4 g/dL (ref 3.5–5.2)
Alkaline Phosphatase: 88 U/L (ref 39–117)
Bilirubin, Direct: 0.1 mg/dL (ref 0.0–0.3)
Total Bilirubin: 0.5 mg/dL (ref 0.2–1.2)
Total Protein: 6.8 g/dL (ref 6.0–8.3)

## 2022-07-16 LAB — CBC WITH DIFFERENTIAL/PLATELET
Basophils Absolute: 0 10*3/uL (ref 0.0–0.1)
Basophils Relative: 0.2 % (ref 0.0–3.0)
Eosinophils Absolute: 0.1 10*3/uL (ref 0.0–0.7)
Eosinophils Relative: 1.1 % (ref 0.0–5.0)
HCT: 41.7 % (ref 36.0–46.0)
Hemoglobin: 14.1 g/dL (ref 12.0–15.0)
Lymphocytes Relative: 33.2 % (ref 12.0–46.0)
Lymphs Abs: 3.1 10*3/uL (ref 0.7–4.0)
MCHC: 33.9 g/dL (ref 30.0–36.0)
MCV: 86.7 fl (ref 78.0–100.0)
Monocytes Absolute: 0.7 10*3/uL (ref 0.1–1.0)
Monocytes Relative: 7.8 % (ref 3.0–12.0)
Neutro Abs: 5.4 10*3/uL (ref 1.4–7.7)
Neutrophils Relative %: 57.7 % (ref 43.0–77.0)
Platelets: 348 10*3/uL (ref 150.0–400.0)
RBC: 4.81 Mil/uL (ref 3.87–5.11)
RDW: 14 % (ref 11.5–15.5)
WBC: 9.4 10*3/uL (ref 4.0–10.5)

## 2022-07-16 LAB — LIPID PANEL
Cholesterol: 308 mg/dL — ABNORMAL HIGH (ref 0–200)
HDL: 47.3 mg/dL (ref >39.00)
NonHDL: 260.53
Total CHOL/HDL Ratio: 7
Triglycerides: 293 mg/dL — ABNORMAL HIGH (ref 0.0–149.0)
VLDL: 58.6 mg/dL — ABNORMAL HIGH (ref 0.0–40.0)

## 2022-07-16 LAB — LDL CHOLESTEROL, DIRECT: Direct LDL: 215 mg/dL

## 2022-07-16 LAB — HEMOGLOBIN A1C: Hgb A1c MFr Bld: 6.2 % (ref 4.6–6.5)

## 2022-07-20 ENCOUNTER — Encounter: Payer: Self-pay | Admitting: Internal Medicine

## 2022-07-20 ENCOUNTER — Ambulatory Visit (INDEPENDENT_AMBULATORY_CARE_PROVIDER_SITE_OTHER): Payer: Medicare Other | Admitting: Internal Medicine

## 2022-07-20 DIAGNOSIS — F320 Major depressive disorder, single episode, mild: Secondary | ICD-10-CM | POA: Diagnosis not present

## 2022-07-20 DIAGNOSIS — K219 Gastro-esophageal reflux disease without esophagitis: Secondary | ICD-10-CM | POA: Diagnosis not present

## 2022-07-20 DIAGNOSIS — R739 Hyperglycemia, unspecified: Secondary | ICD-10-CM

## 2022-07-20 DIAGNOSIS — Z86010 Personal history of colonic polyps: Secondary | ICD-10-CM

## 2022-07-20 DIAGNOSIS — E7800 Pure hypercholesterolemia, unspecified: Secondary | ICD-10-CM

## 2022-07-20 DIAGNOSIS — M48062 Spinal stenosis, lumbar region with neurogenic claudication: Secondary | ICD-10-CM

## 2022-07-20 DIAGNOSIS — I10 Essential (primary) hypertension: Secondary | ICD-10-CM | POA: Diagnosis not present

## 2022-07-20 DIAGNOSIS — R000 Tachycardia, unspecified: Secondary | ICD-10-CM

## 2022-07-20 DIAGNOSIS — I700 Atherosclerosis of aorta: Secondary | ICD-10-CM | POA: Diagnosis not present

## 2022-07-20 NOTE — Progress Notes (Signed)
Patient ID: Angel Riddle, female   DOB: 01-03-1956, 66 y.o.   MRN: [id number]   Subjective:    Patient ID: Angel Riddle, female    DOB: 1956/07/11, 66 y.o.   MRN: [id number]    Patient here for a scheduled follow up.   Chief Complaint  Patient presents with   Follow-up    6-8 week follow up   .   HPI Here to follow up regarding increased stress, hypertension and hypercholesterolemia.  Just recently saw ortho - bilateral thumb [facility] OA.  Thumb spica and injection.  Increased stress related to her husband's health issues.  Discussed.  No chest pain.  Breathing overall stable.  No abdominal pain.  Bowels stable.     Past Medical History:  Diagnosis Date   Allergy    Anxiety    Arthritis    Cancer ([place])    basal cell nose   Chronic back pain    degenerative disc disease   Degenerative disc disease, lumbar    Depression    Dyspnea    with exertion / pain   Gallstones    GERD (gastroesophageal reflux disease)    Heart murmur    followed by PCP   Hx of colonic polyp    Hx: UTI (urinary tract infection)    Hypercholesterolemia    Hypertension    Panic attacks    H/O   Wears dentures    partial upper and lower   Past Surgical History:  Procedure Laterality Date   ABDOMINAL HYSTERECTOMY  1992   partial   BACK SURGERY     lumbar fusion   CATARACT EXTRACTION Bilateral 2010,2011   CHOLECYSTECTOMY  04-19-14   COLONOSCOPY  07/02/11   DR. Byrnett   COLONOSCOPY WITH PROPOFOL N/A 09/25/2015   Procedure: COLONOSCOPY WITH PROPOFOL;  Surgeon: Lucilla Lame, MD;  Location: [facility];  Service: Endoscopy;  Laterality: N/A;   FOOT SURGERY Right 2009   MOUTH SURGERY     POLYPECTOMY  09/25/2015   Procedure: POLYPECTOMY;  Surgeon: Lucilla Lame, MD;  Location: [facility];  Service: Endoscopy;;   skin surgery Left 08/27/13   Basal cell removal-left nostril   TONSILLECTOMY AND ADENOIDECTOMY  1969   Family History  Problem Relation Age of Onset   Hyperlipidemia  Mother    Hypertension Mother    Alcohol abuse Father    Hyperlipidemia Father    Heart disease Father    Diabetes Father    Hyperlipidemia Sister    Lung cancer Brother    Hyperlipidemia Brother    Breast cancer Neg Hx    Social History   Socioeconomic History   Marital status: Married    Spouse name: Not on file   Number of children: 2   Years of education: Not on file   Highest education level: Not on file  Occupational History   Not on file  Tobacco Use   Smoking status: Every Day    Packs/day: 1.00    Years: 37.00    Total pack years: 37.00    Types: Cigarettes    Start date: 08/07/2019    Last attempt to quit: 05/25/2021    Years since quitting: 1.1   Smokeless tobacco: Never   Tobacco comments:    Smokes 1/2 PPD   Vaping Use   Vaping Use: Former  Substance and Sexual Activity   Alcohol use: Yes    Alcohol/week: 1.0 standard drink of alcohol    Types: 1 Glasses of  wine per week   Drug use: No   Sexual activity: Not on file  Other Topics Concern   Not on file  Social History Narrative   Not on file   Social Determinants of Health   Financial Resource Strain: Low Risk  (03/19/2022)   Overall Financial Resource Strain (CARDIA)    Difficulty of Paying Living Expenses: Not very hard  Food Insecurity: No Food Insecurity (03/19/2022)   Hunger Vital Sign    Worried About Running Out of Food in the Last Year: Never true    Ran Out of Food in the Last Year: Never true  Transportation Needs: No Transportation Needs (03/19/2022)   PRAPARE - Hydrologist (Medical): No    Lack of Transportation (Non-Medical): No  Physical Activity: Not on file  Stress: Stress Concern Present (03/19/2022)   [place]    Feeling of Stress : To some extent  Social Connections: Unknown (03/19/2022)   Social Connection and Isolation Panel [NHANES]    Frequency of Communication with Friends and  Family: Not on file    Frequency of Social Gatherings with Friends and Family: Not on file    Attends Religious Services: Not on file    Active Member of Clubs or Organizations: Not on file    Attends Archivist Meetings: Not on file    Marital Status: Married     Review of Systems  Constitutional:  Negative for appetite change and unexpected weight change.  HENT:  Negative for congestion and sinus pressure.   Respiratory:  Negative for cough, chest tightness and shortness of breath.   Cardiovascular:  Negative for chest pain, palpitations and leg swelling.  Gastrointestinal:  Negative for abdominal pain, diarrhea, nausea and vomiting.  Genitourinary:  Negative for difficulty urinating and dysuria.  Musculoskeletal:  Positive for back pain.       Bilateral thumb pain.   Skin:  Negative for color change and rash.  Neurological:  Negative for dizziness, light-headedness and headaches.  Psychiatric/Behavioral:  Negative for agitation and dysphoric mood.        Increased stress as outlined.        Objective:     BP 122/80 (BP Location: Left Arm, Patient Position: Sitting, Cuff Size: Normal)   Pulse 81   Temp 98.2 F (36.8 C) (Oral)   Resp 14   Ht '5\' 5"'  (1.651 m)   Wt 176 lb (79.8 kg)   SpO2 98%   BMI 29.29 kg/m  Wt Readings from Last 3 Encounters:  07/20/22 176 lb (79.8 kg)  06/03/22 184 lb (83.5 kg)  04/13/22 181 lb 2 oz (82.2 kg)    Physical Exam Vitals reviewed.  Constitutional:      General: She is not in acute distress.    Appearance: Normal appearance.  HENT:     Head: Normocephalic and atraumatic.     Right Ear: External ear normal.     Left Ear: External ear normal.  Eyes:     General: No scleral icterus.       Right eye: No discharge.        Left eye: No discharge.     Conjunctiva/sclera: Conjunctivae normal.  Neck:     Thyroid: No thyromegaly.  Cardiovascular:     Rate and Rhythm: Normal rate and regular rhythm.  Pulmonary:     Effort: No  respiratory distress.     Breath sounds: Normal breath sounds. No  wheezing.  Abdominal:     General: Bowel sounds are normal.     Palpations: Abdomen is soft.     Tenderness: There is no abdominal tenderness.  Musculoskeletal:        General: No swelling or tenderness.     Cervical back: Neck supple. No tenderness.  Lymphadenopathy:     Cervical: No cervical adenopathy.  Skin:    Findings: No erythema or rash.  Neurological:     Mental Status: She is alert.  Psychiatric:        Mood and Affect: Mood normal.        Behavior: Behavior normal.      Outpatient Encounter Medications as of 07/20/2022  Medication Sig   albuterol (PROVENTIL) (2.5 MG/3ML) 0.083% nebulizer solution Inhale into the lungs.   albuterol (VENTOLIN HFA) 108 (90 Base) MCG/ACT inhaler Inhale into the lungs.   Alirocumab (PRALUENT) 75 MG/ML SOAJ Inject 1 pen (75 mg) into the skin every 14 days.   ALPRAZolam (XANAX) 0.5 MG tablet Take 1 tablet (0.5 mg total) by mouth daily as needed for anxiety.   aspirin EC 81 MG tablet Take 1 tablet (81 mg total) by mouth daily. Swallow whole.   DULoxetine (CYMBALTA) 60 MG capsule Take 1 capsule (60 mg total) by mouth daily.   estradiol (ESTRACE) 0.1 MG/GM vaginal cream Estrogen Cream Instruction Discard applicator Apply pea sized amount to tip of finger to urethra before bed. Wash hands well after application. Use Monday, Wednesday and Friday   hydrochlorothiazide (MICROZIDE) 12.5 MG capsule Take 1 capsule (12.5 mg total) by mouth daily.   HYDROcodone-acetaminophen (NORCO) 7.5-325 MG tablet Take 1.5 tablets by mouth as needed. Using 1-2 times daily   meloxicam (MOBIC) 15 MG tablet Take 1 tablet (15 mg total) by mouth daily.   metoprolol tartrate (LOPRESSOR) 25 MG tablet Take 1 tablet (25 mg total) by mouth daily.   potassium chloride (KLOR-CON) 10 MEQ tablet Take 1 tablet (10 mEq total) by mouth daily.   pravastatin (PRAVACHOL) 10 MG tablet Take 1 tablet (10 mg total) by mouth  daily.   Semaglutide-Weight Management (WEGOVY) 0.25 MG/0.5ML SOAJ Inject 0.25 mg into the skin once a week.   [DISCONTINUED] FLUoxetine (PROZAC) 40 MG capsule Take 1 capsule (40 mg total) by mouth daily.   [DISCONTINUED] pantoprazole (PROTONIX) 40 MG tablet Take 1 tablet (40 mg total) by mouth daily.   No facility-administered encounter medications on file as of 07/20/2022.     Lab Results  Component Value Date   WBC 9.4 07/16/2022   HGB 14.1 07/16/2022   HCT 41.7 07/16/2022   PLT 348.0 07/16/2022   GLUCOSE 97 07/16/2022   CHOL 308 (H) 07/16/2022   TRIG 293.0 (H) 07/16/2022   HDL 47.30 07/16/2022   LDLDIRECT 215.0 07/16/2022   LDLCALC 97 04/05/2022   ALT 5 07/16/2022   AST 14 07/16/2022   NA 140 07/16/2022   K 3.6 07/16/2022   CL 100 07/16/2022   CREATININE 1.03 07/16/2022   BUN 20 07/16/2022   CO2 29 07/16/2022   TSH 1.35 11/09/2021   HGBA1C 6.2 07/16/2022    MM 3D SCREEN BREAST BILATERAL  Result Date: 03/31/2022 CLINICAL DATA:  Screening. EXAM: DIGITAL SCREENING BILATERAL MAMMOGRAM WITH TOMOSYNTHESIS AND CAD TECHNIQUE: Bilateral screening digital craniocaudal and mediolateral oblique mammograms were obtained. Bilateral screening digital breast tomosynthesis was performed. The images were evaluated with computer-aided detection. COMPARISON:  Previous exam(s). ACR Breast Density Category b: There are scattered areas of fibroglandular density. FINDINGS: There  are no findings suspicious for malignancy. IMPRESSION: No mammographic evidence of malignancy. A result letter of this screening mammogram will be mailed directly to the patient. RECOMMENDATION: Screening mammogram in one year. (Code:SM-B-01Y) BI-RADS CATEGORY  1: Negative. Electronically Signed   By: Dorise Bullion III M.D.   On: 03/31/2022 09:18       Assessment & Plan:   Problem List Items Addressed This Visit     Aortic atherosclerosis ([place])    Continue pravastatin.       Depression, major, single episode, mild  ([place])    Continues on prozac and cymbalta.  She is limiting xanax usage.  Have discussed psych referral.       GERD (gastroesophageal reflux disease)    No upper symptoms reported.  Continue prilosec.        Hypercholesterolemia    Low cholesterol diet and exercise.  Continue pravastatin and praluent.  Follow lipid panel and liver function tests.  Check on patient assistance for praluent.  Lab Results  Component Value Date   CHOL 308 (H) 07/16/2022   HDL 47.30 07/16/2022   LDLCALC 97 04/05/2022   LDLDIRECT 215.0 07/16/2022   TRIG 293.0 (H) 07/16/2022   CHOLHDL 7 07/16/2022       Hyperglycemia    Low carb diet and exercise.  Follow met b and a1c.  Lab Results  Component Value Date   HGBA1C 6.2 07/16/2022       Hypertension    Continue hctz and metoprolol.  Follow pressures.  Follow metabolic panel.       Lumbar stenosis with neurogenic claudication    Has been followed by [facility].       Personal history of colonic polyps    Saw Dr Bary Castilla.  Recommended holding colonoscopy until 2025-2026.        Tachycardia    Doing well on metoprolol.  Follow.         Einar Pheasant, MD

## 2022-07-22 DIAGNOSIS — D2262 Melanocytic nevi of left upper limb, including shoulder: Secondary | ICD-10-CM | POA: Diagnosis not present

## 2022-07-22 DIAGNOSIS — L570 Actinic keratosis: Secondary | ICD-10-CM | POA: Diagnosis not present

## 2022-07-22 DIAGNOSIS — L820 Inflamed seborrheic keratosis: Secondary | ICD-10-CM | POA: Diagnosis not present

## 2022-07-22 DIAGNOSIS — D225 Melanocytic nevi of trunk: Secondary | ICD-10-CM | POA: Diagnosis not present

## 2022-07-22 DIAGNOSIS — Z85828 Personal history of other malignant neoplasm of skin: Secondary | ICD-10-CM | POA: Diagnosis not present

## 2022-07-22 DIAGNOSIS — D2261 Melanocytic nevi of right upper limb, including shoulder: Secondary | ICD-10-CM | POA: Diagnosis not present

## 2022-07-22 DIAGNOSIS — L538 Other specified erythematous conditions: Secondary | ICD-10-CM | POA: Diagnosis not present

## 2022-07-23 ENCOUNTER — Telehealth: Payer: Self-pay

## 2022-07-23 NOTE — Progress Notes (Signed)
[facility])                                            [place] Team    07/23/2022  Angel Riddle 1956/11/02 [id number]  Dr. Nicki Reaper,  [name] was referred to the [place] team for medication assistance with her Praluent. I was able to speak with the patient today. She has been enrolled in the [facility] [place] (PAF), where she will receive notifications of updates to the [employer].   Additionally, it seems that she may also qualify for the Praluent PAP. My team will continue to provide updates on the status of her medication assistance for Praluent.   Reason for referral: [facility] is happy to assist the patient/family in the future for clinical pharmacy needs, following a discussion from your team about [facility] outreach. Thank you for allowing [facility] to be a part of your patient's care.   Thanks,  Reed Breech, [place] [phone]

## 2022-07-27 ENCOUNTER — Other Ambulatory Visit: Payer: Self-pay | Admitting: Internal Medicine

## 2022-07-27 ENCOUNTER — Telehealth: Payer: Self-pay | Admitting: Pharmacy Technician

## 2022-07-27 DIAGNOSIS — Z596 Low income: Secondary | ICD-10-CM

## 2022-07-27 NOTE — Progress Notes (Signed)
[facility])                                            [place] Team    07/27/2022  IVIANNA NOTCH June 01, 1956 [id number]                                      Medication Assistance Referral  Referral From: [place]   Medication/Company: [employer] / [employer] Patient application portion:  Mailed Provider application portion: Faxed  to Dr. Einar Pheasant Provider address/fax verified via: Office website  Gad Aymond P. [place] [address], [place]  [phone]

## 2022-07-29 ENCOUNTER — Ambulatory Visit: Payer: Self-pay | Admitting: Psychiatry

## 2022-07-30 DIAGNOSIS — M19012 Primary osteoarthritis, left shoulder: Secondary | ICD-10-CM | POA: Diagnosis not present

## 2022-08-01 ENCOUNTER — Encounter: Payer: Self-pay | Admitting: Internal Medicine

## 2022-08-01 NOTE — Assessment & Plan Note (Addendum)
Saw Dr Bary Castilla.  Recommended holding colonoscopy until 2025-2026.

## 2022-08-01 NOTE — Assessment & Plan Note (Signed)
Continue hctz and metoprolol.  Follow pressures.  Follow metabolic panel.

## 2022-08-01 NOTE — Assessment & Plan Note (Signed)
Low carb diet and exercise.  Follow met b and a1c.  Lab Results  Component Value Date   HGBA1C 6.2 07/16/2022   

## 2022-08-01 NOTE — Assessment & Plan Note (Signed)
No upper symptoms reported.  Continue prilosec.  

## 2022-08-01 NOTE — Assessment & Plan Note (Signed)
Has been followed by [facility].

## 2022-08-01 NOTE — Assessment & Plan Note (Addendum)
Low cholesterol diet and exercise.  Continue pravastatin and praluent.  Follow lipid panel and liver function tests.  Check on patient assistance for praluent.  Lab Results  Component Value Date   CHOL 308 (H) 07/16/2022   HDL 47.30 07/16/2022   LDLCALC 97 04/05/2022   LDLDIRECT 215.0 07/16/2022   TRIG 293.0 (H) 07/16/2022   CHOLHDL 7 07/16/2022

## 2022-08-01 NOTE — Assessment & Plan Note (Signed)
Doing well on metoprolol.  Follow.  

## 2022-08-01 NOTE — Assessment & Plan Note (Signed)
Continues on prozac and cymbalta.  She is limiting xanax usage.  Have discussed psych referral.

## 2022-08-01 NOTE — Assessment & Plan Note (Signed)
Continue pravastatin 

## 2022-08-04 ENCOUNTER — Other Ambulatory Visit: Payer: Self-pay | Admitting: Internal Medicine

## 2022-08-14 ENCOUNTER — Other Ambulatory Visit: Payer: Self-pay | Admitting: Internal Medicine

## 2022-08-16 ENCOUNTER — Ambulatory Visit
Admission: RE | Admit: 2022-08-16 | Discharge: 2022-08-16 | Disposition: A | Discharge status: Home / Self Care | Payer: Medicare Other | Source: Ambulatory Visit | Attending: Internal Medicine | Admitting: Internal Medicine

## 2022-08-16 DIAGNOSIS — M85851 Other specified disorders of bone density and structure, right thigh: Secondary | ICD-10-CM | POA: Diagnosis not present

## 2022-08-16 DIAGNOSIS — E2839 Other primary ovarian failure: Secondary | ICD-10-CM | POA: Insufficient documentation

## 2022-08-16 DIAGNOSIS — M85832 Other specified disorders of bone density and structure, left forearm: Secondary | ICD-10-CM | POA: Diagnosis not present

## 2022-08-17 ENCOUNTER — Telehealth: Payer: Self-pay

## 2022-08-17 NOTE — Telephone Encounter (Signed)
Lvm for pt to return call in regards to bone density results.  Per Dr.Scott: Notify - bone density reveals osteopenia.  This means she has had some bone loss, but does not meet criteria for osteoporosis.  Recommend- calcium, vitamin D and weight bearing exercise (for example, walking, waling with hand weights).

## 2022-08-17 NOTE — Telephone Encounter (Signed)
Pt called and I read the message to her and she appreciate the provider but she has to take care of her husband because he has stage 4 cancer. Pt would like to know if they have any gummies for the calcium and vitamin D

## 2022-08-24 ENCOUNTER — Other Ambulatory Visit: Payer: Self-pay | Admitting: Internal Medicine

## 2022-08-24 DIAGNOSIS — I10 Essential (primary) hypertension: Secondary | ICD-10-CM

## 2022-08-31 DIAGNOSIS — M4316 Spondylolisthesis, lumbar region: Secondary | ICD-10-CM | POA: Diagnosis not present

## 2022-09-02 ENCOUNTER — Other Ambulatory Visit: Payer: Self-pay | Admitting: Internal Medicine

## 2022-09-02 DIAGNOSIS — I10 Essential (primary) hypertension: Secondary | ICD-10-CM

## 2022-09-06 ENCOUNTER — Other Ambulatory Visit: Payer: Self-pay | Admitting: Internal Medicine

## 2022-09-07 ENCOUNTER — Other Ambulatory Visit: Payer: Self-pay | Admitting: Internal Medicine

## 2022-09-07 DIAGNOSIS — I700 Atherosclerosis of aorta: Secondary | ICD-10-CM

## 2022-09-07 NOTE — Telephone Encounter (Signed)
Pt returned call & she stated that she felt the 15 mg dose was working well. She did not want to back down to 7.5 mg dose. She also has appointment 10/17 & wanted to know can she just have kidney function checked then?

## 2022-09-07 NOTE — Telephone Encounter (Signed)
LMTCB

## 2022-09-07 NOTE — Telephone Encounter (Signed)
Please confirm with her that she still needs to be on the 31m dose.  If she thinks she would do ok can change to 7.559mtablet.  If going to stay on the medication, will need f/u met b to confirm kidney function ok.

## 2022-09-08 NOTE — Telephone Encounter (Signed)
Rx ok's for meloxicam '15mg'$ .  Notify her we will recheck kidney function at her f/u appt

## 2022-09-08 NOTE — Telephone Encounter (Signed)
Pt.notified

## 2022-09-17 ENCOUNTER — Other Ambulatory Visit (HOSPITAL_COMMUNITY): Payer: Self-pay

## 2022-09-17 ENCOUNTER — Other Ambulatory Visit: Payer: Self-pay | Admitting: Pharmacist

## 2022-09-17 DIAGNOSIS — E7800 Pure hypercholesterolemia, unspecified: Secondary | ICD-10-CM

## 2022-09-17 MED ORDER — PRALUENT 75 MG/ML ~~LOC~~ SOAJ
75.0000 mg | SUBCUTANEOUS | 3 refills | Status: DC
  Filled 2022-09-17: qty 6, 84d supply, fill #0

## 2022-09-17 NOTE — Progress Notes (Signed)
[place] re-enrollment has opened for copay support for hyperlipidemia. Completed re-enrollment via provider portal.   Patient re-enrolled 08/18/22-08/18/23  BIN: [id number] PCN: [id number] Group: [id number] ID: [id number]  Order sent to [facility] and patient notified.   Catie Hedwig Morton, PharmD, [facility] [phone]

## 2022-09-21 ENCOUNTER — Encounter: Payer: Self-pay | Admitting: Internal Medicine

## 2022-09-21 ENCOUNTER — Ambulatory Visit (INDEPENDENT_AMBULATORY_CARE_PROVIDER_SITE_OTHER): Payer: Medicare Other | Admitting: Internal Medicine

## 2022-09-21 ENCOUNTER — Other Ambulatory Visit (HOSPITAL_COMMUNITY): Payer: Self-pay

## 2022-09-21 VITALS — BP 118/78 | HR 78 | Temp 99.5°F | Ht 65.0 in | Wt 170.0 lb

## 2022-09-21 DIAGNOSIS — G8929 Other chronic pain: Secondary | ICD-10-CM | POA: Diagnosis not present

## 2022-09-21 DIAGNOSIS — E538 Deficiency of other specified B group vitamins: Secondary | ICD-10-CM

## 2022-09-21 DIAGNOSIS — Z23 Encounter for immunization: Secondary | ICD-10-CM | POA: Diagnosis not present

## 2022-09-21 DIAGNOSIS — R739 Hyperglycemia, unspecified: Secondary | ICD-10-CM | POA: Diagnosis not present

## 2022-09-21 DIAGNOSIS — F320 Major depressive disorder, single episode, mild: Secondary | ICD-10-CM

## 2022-09-21 DIAGNOSIS — K219 Gastro-esophageal reflux disease without esophagitis: Secondary | ICD-10-CM

## 2022-09-21 DIAGNOSIS — M549 Dorsalgia, unspecified: Secondary | ICD-10-CM | POA: Diagnosis not present

## 2022-09-21 DIAGNOSIS — M48062 Spinal stenosis, lumbar region with neurogenic claudication: Secondary | ICD-10-CM

## 2022-09-21 DIAGNOSIS — I700 Atherosclerosis of aorta: Secondary | ICD-10-CM

## 2022-09-21 DIAGNOSIS — Z86010 Personal history of colonic polyps: Secondary | ICD-10-CM | POA: Diagnosis not present

## 2022-09-21 DIAGNOSIS — E7800 Pure hypercholesterolemia, unspecified: Secondary | ICD-10-CM

## 2022-09-21 DIAGNOSIS — I10 Essential (primary) hypertension: Secondary | ICD-10-CM

## 2022-09-21 MED ORDER — CYANOCOBALAMIN 1000 MCG/ML IJ SOLN
1000.0000 ug | Freq: Once | INTRAMUSCULAR | Status: AC
  Administered 2022-09-21: 1000 ug via INTRAMUSCULAR

## 2022-09-21 NOTE — Progress Notes (Signed)
Patient ID: Angel Riddle, female   DOB: January 01, 1956, 66 y.o.   MRN: [id number]   Subjective:    Patient ID: Angel Riddle, female    DOB: 1956-04-14, 66 y.o.   MRN: [id number]   Patient here for  Chief Complaint  Patient presents with   Follow-up   .   HPI Here to follow up regarding increased stress, blood pressure and cholesterol.  Persistent back/leg issues.  Saw Dr Arnoldo Morale 08/31/22 - pain - hard to lie down on left side.  Pain down lateral left leg to foot.  Prednisone.  Also saw ortho - left shoulder pain.  S/p injection.  Bilateral thumb pain - [facility] OA - injection/thumb spica.  F/u 10/12/22.  No chest pain reported.  No increased cough and congestion.  Some increased fatigue.  Persistent increased stress with her husband's health issues.  He is doing some better - physically - able do do more.     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   Chronic back pain    degenerative disc disease   Degenerative disc disease, lumbar    Depression    Dyspnea    with exertion / pain   Gallstones    GERD (gastroesophageal reflux disease)    Heart murmur    followed by PCP   Hx of colonic polyp    Hx: UTI (urinary tract infection)    Hypercholesterolemia    Hypertension    Panic attacks    H/O   Wears dentures    partial upper and lower   Past Surgical History:  Procedure Laterality Date   ABDOMINAL HYSTERECTOMY  1992   partial   BACK SURGERY     lumbar fusion   CATARACT EXTRACTION Bilateral 2010,2011   CHOLECYSTECTOMY  04-19-14   COLONOSCOPY  07/02/11   DR. Byrnett   COLONOSCOPY WITH PROPOFOL N/A 09/25/2015   Procedure: COLONOSCOPY WITH PROPOFOL;  Surgeon: Lucilla Lame, MD;  Location: [facility];  Service: Endoscopy;  Laterality: N/A;   FOOT SURGERY Right 2009   MOUTH SURGERY     POLYPECTOMY  09/25/2015   Procedure: POLYPECTOMY;  Surgeon: Lucilla Lame, MD;  Location: [facility];  Service:  Endoscopy;;   skin surgery Left 08/27/13   Basal cell removal-left nostril   TONSILLECTOMY AND ADENOIDECTOMY  1969   Family History  Problem Relation Age of Onset   Hyperlipidemia Mother    Hypertension Mother    Alcohol abuse Father    Hyperlipidemia Father    Heart disease Father    Diabetes Father    Hyperlipidemia Sister    Lung cancer Brother    Hyperlipidemia Brother    Breast cancer Neg Hx    Social History   Socioeconomic History   Marital status: Married    Spouse name: Not on file   Number of children: 2   Years of education: Not on file   Highest education level: Not on file  Occupational History   Not on file  Tobacco Use   Smoking status: Every Day    Packs/day: 1.00    Years: 37.00    Total pack years: 37.00    Types: Cigarettes    Start date: 08/07/2019    Last attempt to quit: 05/25/2021    Years since quitting: 1.3   Smokeless tobacco: Never   Tobacco comments:    Smokes  1/2 PPD   Vaping Use   Vaping Use: Former  Substance and Sexual Activity   Alcohol use: Yes    Alcohol/week: 1.0 standard drink of alcohol    Types: 1 Glasses of wine per week   Drug use: No   Sexual activity: Not on file  Other Topics Concern   Not on file  Social History Narrative   Not on file   Social Determinants of Health   Financial Resource Strain: Low Risk  (03/19/2022)   Overall Financial Resource Strain (CARDIA)    Difficulty of Paying Living Expenses: Not very hard  Food Insecurity: No Food Insecurity (03/19/2022)   Hunger Vital Sign    Worried About Running Out of Food in the Last Year: Never true    Ran Out of Food in the Last Year: Never true  Transportation Needs: No Transportation Needs (03/19/2022)   PRAPARE - Hydrologist (Medical): No    Lack of Transportation (Non-Medical): No  Physical Activity: Not on file  Stress: Stress Concern Present (03/19/2022)   [place]    Feeling of Stress : To some extent  Social Connections: Unknown (03/19/2022)   Social Connection and Isolation Panel [NHANES]    Frequency of Communication with Friends and Family: Not on file    Frequency of Social Gatherings with Friends and Family: Not on file    Attends Religious Services: Not on file    Active Member of Clubs or Organizations: Not on file    Attends Archivist Meetings: Not on file    Marital Status: Married     Review of Systems  Constitutional:  Positive for fatigue. Negative for appetite change and unexpected weight change.  HENT:  Negative for congestion and sinus pressure.   Respiratory:  Negative for cough, chest tightness and shortness of breath.   Cardiovascular:  Negative for chest pain, palpitations and leg swelling.  Gastrointestinal:  Negative for abdominal pain, diarrhea, nausea and vomiting.  Genitourinary:  Negative for difficulty urinating and dysuria.  Musculoskeletal:  Negative for myalgias.       Persistent back and left leg issues - pain left lateral leg to foot.   Skin:  Negative for color change and rash.  Neurological:  Negative for dizziness, light-headedness and headaches.  Psychiatric/Behavioral:  Negative for agitation.        Increased stress as outlined.        Objective:     BP 118/78   Pulse 78   Temp 99.5 F (37.5 C) (Oral)   Ht _0  (1.651 m)   Wt 170 lb (77.1 kg)   SpO2 99%   BMI 28.29 kg/m  Wt Readings from Last 3 Encounters:  09/21/22 170 lb (77.1 kg)  07/20/22 176 lb (79.8 kg)  06/03/22 184 lb (83.5 kg)    Physical Exam Vitals reviewed.  Constitutional:      General: She is not in acute distress.    Appearance: Normal appearance.  HENT:     Head: Normocephalic and atraumatic.     Right Ear: External ear normal.     Left Ear: External ear normal.  Eyes:     General: No scleral icterus.       Right eye: No discharge.        Left eye: No discharge.     Conjunctiva/sclera:  Conjunctivae normal.  Neck:     Thyroid: No thyromegaly.  Cardiovascular:  Rate and Rhythm: Normal rate and regular rhythm.  Pulmonary:     Effort: No respiratory distress.     Breath sounds: Normal breath sounds. No wheezing.  Abdominal:     General: Bowel sounds are normal.     Palpations: Abdomen is soft.     Tenderness: There is no abdominal tenderness.  Musculoskeletal:        General: No swelling or tenderness.     Cervical back: Neck supple. No tenderness.  Lymphadenopathy:     Cervical: No cervical adenopathy.  Skin:    Findings: No erythema or rash.  Neurological:     Mental Status: She is alert.  Psychiatric:        Mood and Affect: Mood normal.        Behavior: Behavior normal.      Outpatient Encounter Medications as of 09/21/2022  Medication Sig   albuterol (PROVENTIL) (2.5 MG/3ML) 0.083% nebulizer solution Inhale into the lungs.   albuterol (VENTOLIN HFA) 108 (90 Base) MCG/ACT inhaler Inhale into the lungs.   ALPRAZolam (XANAX) 0.5 MG tablet Take 1 tablet (0.5 mg total) by mouth daily as needed for anxiety.   aspirin EC 81 MG tablet Take 1 tablet (81 mg total) by mouth daily. Swallow whole.   DULoxetine (CYMBALTA) 60 MG capsule Take 1 capsule (60 mg total) by mouth daily.   estradiol (ESTRACE) 0.1 MG/GM vaginal cream Estrogen Cream Instruction Discard applicator Apply pea sized amount to tip of finger to urethra before bed. Wash hands well after application. Use Monday, Wednesday and Friday   FLUoxetine (PROZAC) 40 MG capsule Take 1 capsule (40 mg total) by mouth daily.   hydrochlorothiazide (MICROZIDE) 12.5 MG capsule Take 1 capsule (12.5 mg total) by mouth daily.   HYDROcodone-acetaminophen (NORCO) 7.5-325 MG tablet Take 1.5 tablets by mouth as needed. Using 1-2 times daily   meloxicam (MOBIC) 15 MG tablet Take 1 tablet (15 mg total) by mouth daily.   metoprolol tartrate (LOPRESSOR) 25 MG tablet Take 1 tablet (25 mg total) by mouth daily.   pantoprazole  (PROTONIX) 40 MG tablet Take 1 tablet (40 mg total) by mouth daily.   potassium chloride (KLOR-CON) 10 MEQ tablet Take 1 tablet (10 mEq total) by mouth daily.   pravastatin (PRAVACHOL) 10 MG tablet Take 1 tablet (10 mg total) by mouth daily.   [DISCONTINUED] Alirocumab (PRALUENT) 75 MG/ML SOAJ Inject 1 pen (75 mg) into the skin every 14 days. (Patient not taking: Reported on 09/21/2022)   [DISCONTINUED] Semaglutide-Weight Management (WEGOVY) 0.25 MG/0.5ML SOAJ Inject 0.25 mg into the skin once a week. (Patient not taking: Reported on 09/21/2022)   [EXPIRED] cyanocobalamin (VITAMIN B12) injection 1,000 mcg    No facility-administered encounter medications on file as of 09/21/2022.     Lab Results  Component Value Date   WBC 9.4 07/16/2022   HGB 14.1 07/16/2022   HCT 41.7 07/16/2022   PLT 348.0 07/16/2022   GLUCOSE 97 07/16/2022   CHOL 308 (H) 07/16/2022   TRIG 293.0 (H) 07/16/2022   HDL 47.30 07/16/2022   LDLDIRECT 215.0 07/16/2022   LDLCALC 97 04/05/2022   ALT 5 07/16/2022   AST 14 07/16/2022   NA 140 07/16/2022   K 3.6 07/16/2022   CL 100 07/16/2022   CREATININE 1.03 07/16/2022   BUN 20 07/16/2022   CO2 29 07/16/2022   TSH 1.35 11/09/2021   HGBA1C 6.2 07/16/2022    DG Bone Density  Result Date: 08/16/2022 EXAM: DUAL X-RAY ABSORPTIOMETRY (DXA) FOR BONE MINERAL  DENSITY IMPRESSION: Your patient Angel Riddle completed a BMD test on 08/16/2022 using the [place] (software version: 14.10) manufactured by [employer]. The following summarizes the results of our evaluation. Technologist:VLM PATIENT BIOGRAPHICAL: Name: Angel Riddle, Angel Riddle Patient ID: [id number] Birth Date: [birth year] Height: 64.5 in. Gender: Female Exam Date: 08/16/2022 Weight: 173.0 lbs. Indications: Caucasian, Postmenopausal Fractures: Treatments: Albuterol DENSITOMETRY RESULTS: Site         Region     Measured Date Measured Age WHO Classification Young Adult T-score BMD         %Change vs.  Previous Significant Change (*) AP Spine L1-L2 8/16/2022 66.5 Normal 0.3 1.215 g/cm2 - - DualFemur Neck Right 08/16/2022 66.5 Osteopenia -1.8 0.781 g/cm2 - - Left Forearm Radius 33% 08/16/2022 66.5 Osteopenia -1.7 0.729 g/cm2 - - ASSESSMENT: The BMD measured at Femur Neck Right is 0.781 g/cm2 with a T-score of -1.8. This patient is considered osteopenic according to [facility]) criteria. L-3 & L-4 were excluded due to degenerative changes. The scan quality is good. World Pharmacologist [facility]) criteria for post-menopausal, Caucasian Women: Normal:                   T-score at or above -1 SD Osteopenia/low bone mass: T-score between -1 and -2.5 SD Osteoporosis:             T-score at or below -2.5 SD RECOMMENDATIONS: 1. All patients should optimize calcium and vitamin D intake. 2. Consider FDA-approved medical therapies in postmenopausal women and men aged 38 years and older, based on the following: a. A hip or vertebral(clinical or morphometric) fracture b. T-score < -2.5 at the femoral neck or spine after appropriate evaluation to exclude secondary causes c. Low bone mass (T-score between -1.0 and -2.5 at the femoral neck or spine) and a 10-year probability of a hip fracture > 3% or a 10-year probability of a major osteoporosis-related fracture > 20% based on the [place]-adapted WHO algorithm 3. Clinician judgment and/or patient preferences may indicate treatment for people with 10-year fracture probabilities above or below these levels FOLLOW-UP: People with diagnosed cases of osteoporosis or at high risk for fracture should have regular bone mineral density tests. For patients eligible for Medicare, routine testing is allowed once every 2 years. The testing frequency can be increased to one year for patients who have rapidly progressing disease, those who are receiving or discontinuing medical therapy to restore bone mass, or have additional risk factors. I have reviewed this report, and agree  with the above findings. [facility] Dear Einar Pheasant, Your patient Angel Riddle completed a FRAX assessment on 08/16/2022 using the [place] (analysis version: 14.10) manufactured by [employer]. The following summarizes the results of our evaluation. PATIENT BIOGRAPHICAL: Name: Angel Riddle, Angel Riddle Patient ID: [id number] Birth Date: Nov 17, 1956 Height:    64.5 in. Gender:     [age]    [age]       Weight:    173.0 lbs. Ethnicity:  White                            Exam Date: 08/16/2022 FRAX* RESULTS:  (version: 3.5) 10-year Probability of Fracture1 Major Osteoporotic Fracture2 Hip Fracture 10.3% 1.5% Population: [place] (Caucasian) Risk Factors: None Based on Femur (Right) Neck BMD 1 -The 10-year probability of fracture may be lower than reported if the patient  has received treatment. 2 -Major Osteoporotic Fracture: Clinical Spine, Forearm, Hip or Shoulder *FRAX is a Materials engineer of the [employer] for Metabolic Bone Disease, a [place] (WHO) [facility]. ASSESSMENT: The probability of a major osteoporotic fracture is 10.3% within the next ten years. The probability of a hip fracture is 1.5% within the next ten years. . Electronically Signed   By: Ammie Ferrier M.D.   On: 08/16/2022 14:34       Assessment & Plan:   Problem List Items Addressed This Visit     Aortic atherosclerosis ([place])    Continue pravastatin.       B12 deficiency    Continue b12 injections.       Chronic back pain    S/p previous surgery. Persistent pain.  Seeing [facility].       Depression, major, single episode, mild ([place])    Continues on prozac and cymbalta.  She is limiting xanax usage.  Have discussed psych referral.       GERD (gastroesophageal reflux disease)    No upper symptoms reported.  Continue prilosec.        Hypercholesterolemia    Low cholesterol diet and exercise.  Continue pravastatin. Follow lipid panel  and liver function tests.  Lab Results  Component Value Date   CHOL 308 (H) 07/16/2022   HDL 47.30 07/16/2022   LDLCALC 97 04/05/2022   LDLDIRECT 215.0 07/16/2022   TRIG 293.0 (H) 07/16/2022   CHOLHDL 7 07/16/2022       Relevant Orders   Hepatic function panel   TSH   Lipid panel   Hyperglycemia    Low carb diet and exercise.  Follow met b and a1c.  Lab Results  Component Value Date   HGBA1C 6.2 07/16/2022       Relevant Orders   Hemoglobin A1c   Hypertension    Continue hctz and metoprolol.  Recheck wnl. Follow pressures.  Follow metabolic panel.       Relevant Orders   Basic metabolic panel   Lumbar stenosis with neurogenic claudication    Saw Dr Arnoldo Morale.  Prednisone.  Continue f/u with [facility]/ortho      Personal history of colonic polyps    Saw Dr Bary Castilla.  Recommended holding colonoscopy until 2025-2026.        Other Visit Diagnoses     Need for immunization against influenza    -  Primary   Relevant Orders   Flu Vaccine QUAD High Dose(Fluad) (Completed)        Einar Pheasant, MD

## 2022-09-21 NOTE — Progress Notes (Signed)
Pt was administered the vitamin B12 1,000 mcg/mL injection into the right deltoid. Pt tolerated shot well.

## 2022-09-26 ENCOUNTER — Encounter: Payer: Self-pay | Admitting: Internal Medicine

## 2022-09-26 DIAGNOSIS — E538 Deficiency of other specified B group vitamins: Secondary | ICD-10-CM | POA: Insufficient documentation

## 2022-09-26 NOTE — Assessment & Plan Note (Signed)
Continue pravastatin 

## 2022-09-26 NOTE — Assessment & Plan Note (Signed)
Low cholesterol diet and exercise.  Continue pravastatin. Follow lipid panel and liver function tests.  Lab Results  Component Value Date   CHOL 308 (H) 07/16/2022   HDL 47.30 07/16/2022   LDLCALC 97 04/05/2022   LDLDIRECT 215.0 07/16/2022   TRIG 293.0 (H) 07/16/2022   CHOLHDL 7 07/16/2022

## 2022-09-26 NOTE — Assessment & Plan Note (Signed)
Continue hctz and metoprolol.  Recheck wnl. Follow pressures.  Follow metabolic panel.  

## 2022-09-26 NOTE — Assessment & Plan Note (Signed)
Saw Dr Arnoldo Morale.  Prednisone.  Continue f/u with [facility]/ortho

## 2022-09-26 NOTE — Assessment & Plan Note (Signed)
Saw Dr Bary Castilla.  Recommended holding colonoscopy until 2025-2026.

## 2022-09-26 NOTE — Assessment & Plan Note (Signed)
Continues on prozac and cymbalta.  She is limiting xanax usage.  Have discussed psych referral.

## 2022-09-26 NOTE — Assessment & Plan Note (Signed)
No upper symptoms reported.  Continue prilosec.  

## 2022-09-26 NOTE — Assessment & Plan Note (Signed)
Continue b12 injections.  

## 2022-09-26 NOTE — Assessment & Plan Note (Signed)
S/p previous surgery. Persistent pain.  Seeing [facility].  

## 2022-09-26 NOTE — Assessment & Plan Note (Signed)
Low carb diet and exercise.  Follow met b and a1c.  Lab Results  Component Value Date   HGBA1C 6.2 07/16/2022

## 2022-09-28 ENCOUNTER — Other Ambulatory Visit: Payer: Self-pay | Admitting: Internal Medicine

## 2022-09-29 DIAGNOSIS — J029 Acute pharyngitis, unspecified: Secondary | ICD-10-CM | POA: Diagnosis not present

## 2022-09-29 DIAGNOSIS — J069 Acute upper respiratory infection, unspecified: Secondary | ICD-10-CM | POA: Diagnosis not present

## 2022-09-29 DIAGNOSIS — M791 Myalgia, unspecified site: Secondary | ICD-10-CM | POA: Diagnosis not present

## 2022-09-29 NOTE — Telephone Encounter (Signed)
Rx ok'd for refill cymbalta.

## 2022-10-04 ENCOUNTER — Encounter (INDEPENDENT_AMBULATORY_CARE_PROVIDER_SITE_OTHER): Payer: Self-pay

## 2022-10-12 DIAGNOSIS — M180 Bilateral primary osteoarthritis of first carpometacarpal joints: Secondary | ICD-10-CM | POA: Diagnosis not present

## 2022-10-18 ENCOUNTER — Telehealth: Payer: Self-pay | Admitting: Internal Medicine

## 2022-10-18 ENCOUNTER — Other Ambulatory Visit: Payer: Self-pay

## 2022-10-18 ENCOUNTER — Other Ambulatory Visit: Payer: Self-pay | Admitting: Internal Medicine

## 2022-10-18 NOTE — Telephone Encounter (Signed)
Pt need refill on ALPRAZolam sent to [facility]

## 2022-10-18 NOTE — Telephone Encounter (Signed)
Butch Penny from [place] [facility] called about a refill for pt and Butch Penny stated her husband is coming off the ventilator tomorrow

## 2022-10-18 NOTE — Telephone Encounter (Signed)
S/w pt - made aware rx will be signed by end of day Dr Nicki Reaper aware of situation

## 2022-10-18 NOTE — Telephone Encounter (Signed)
Sent to Dr Nicki Reaper for signature

## 2022-10-19 ENCOUNTER — Ambulatory Visit: Payer: Medicare Other

## 2022-10-19 MED ORDER — ALPRAZOLAM 0.5 MG PO TABS: 0.5000 mg | ORAL_TABLET | Freq: Every day | ORAL | 0 refills | Status: DC | PRN

## 2022-10-19 NOTE — Telephone Encounter (Signed)
Rx ok'd for xanax #30 with no refills.  ?

## 2022-10-20 NOTE — Telephone Encounter (Signed)
Rx has already been addressed.  Refilled xanax on 10/19/22

## 2022-10-20 NOTE — Telephone Encounter (Signed)
I believe this may be a duplicate request.

## 2022-11-01 ENCOUNTER — Other Ambulatory Visit: Payer: Self-pay | Admitting: Internal Medicine

## 2022-11-02 DIAGNOSIS — M19012 Primary osteoarthritis, left shoulder: Secondary | ICD-10-CM | POA: Diagnosis not present

## 2022-11-05 ENCOUNTER — Other Ambulatory Visit: Payer: Self-pay | Admitting: Internal Medicine

## 2022-11-23 DIAGNOSIS — M4316 Spondylolisthesis, lumbar region: Secondary | ICD-10-CM | POA: Diagnosis not present

## 2022-11-23 DIAGNOSIS — Z6827 Body mass index (BMI) 27.0-27.9, adult: Secondary | ICD-10-CM | POA: Diagnosis not present

## 2022-12-01 ENCOUNTER — Other Ambulatory Visit: Payer: Self-pay | Admitting: Internal Medicine

## 2022-12-07 ENCOUNTER — Other Ambulatory Visit: Payer: Self-pay | Admitting: *Deleted

## 2022-12-07 MED ORDER — PANTOPRAZOLE SODIUM 40 MG PO TBEC: 40.0000 mg | DELAYED_RELEASE_TABLET | Freq: Every day | ORAL | 5 refills | Status: DC

## 2022-12-18 ENCOUNTER — Other Ambulatory Visit: Payer: Self-pay | Admitting: Internal Medicine

## 2022-12-18 DIAGNOSIS — I10 Essential (primary) hypertension: Secondary | ICD-10-CM

## 2022-12-20 ENCOUNTER — Other Ambulatory Visit: Payer: Self-pay | Admitting: Internal Medicine

## 2022-12-20 ENCOUNTER — Other Ambulatory Visit (HOSPITAL_COMMUNITY): Payer: Self-pay

## 2022-12-20 DIAGNOSIS — E7800 Pure hypercholesterolemia, unspecified: Secondary | ICD-10-CM

## 2022-12-20 NOTE — Telephone Encounter (Signed)
Rx was discontinued on 09/26/2022 is it okay to refuse?

## 2022-12-21 NOTE — Telephone Encounter (Signed)
Received request for praluent.  Per note, not taking. Need to call pt and confirm if taking praluent.

## 2022-12-21 NOTE — Telephone Encounter (Signed)
Lm for pt to cb to confirm if taking

## 2022-12-22 ENCOUNTER — Other Ambulatory Visit (HOSPITAL_COMMUNITY): Payer: Self-pay

## 2022-12-22 ENCOUNTER — Other Ambulatory Visit: Payer: Self-pay

## 2022-12-22 MED ORDER — PRALUENT 75 MG/ML ~~LOC~~ SOAJ
75.0000 mg | SUBCUTANEOUS | 3 refills | Status: DC
  Filled 2022-12-22: qty 6, 84d supply, fill #0
  Filled 2023-03-11: qty 6, 84d supply, fill #1
  Filled 2023-05-30: qty 6, 84d supply, fill #2
  Filled 2023-08-22: qty 6, 84d supply, fill #3

## 2022-12-22 NOTE — Telephone Encounter (Signed)
S/w pt - is still using Praluent. Rx sent to [place].

## 2022-12-23 ENCOUNTER — Other Ambulatory Visit (INDEPENDENT_AMBULATORY_CARE_PROVIDER_SITE_OTHER): Payer: Medicare Other

## 2022-12-23 DIAGNOSIS — R739 Hyperglycemia, unspecified: Secondary | ICD-10-CM

## 2022-12-23 DIAGNOSIS — I10 Essential (primary) hypertension: Secondary | ICD-10-CM

## 2022-12-23 DIAGNOSIS — E7800 Pure hypercholesterolemia, unspecified: Secondary | ICD-10-CM

## 2022-12-23 LAB — BASIC METABOLIC PANEL
BUN: 17 mg/dL (ref 6–23)
CO2: 27 mEq/L (ref 19–32)
Calcium: 9.3 mg/dL (ref 8.4–10.5)
Chloride: 101 mEq/L (ref 96–112)
Creatinine, Ser: 0.93 mg/dL (ref 0.40–1.20)
GFR: 63.88 mL/min (ref >60.00)
Glucose, Bld: 95 mg/dL (ref 70–99)
Potassium: 4.1 mEq/L (ref 3.5–5.1)
Sodium: 140 mEq/L (ref 135–145)

## 2022-12-23 LAB — HEPATIC FUNCTION PANEL
ALT: 9 U/L (ref 0–35)
AST: 17 U/L (ref 0–37)
Albumin: 4.1 g/dL (ref 3.5–5.2)
Alkaline Phosphatase: 100 U/L (ref 39–117)
Bilirubin, Direct: 0.1 mg/dL (ref 0.0–0.3)
Total Bilirubin: 0.5 mg/dL (ref 0.2–1.2)
Total Protein: 6.7 g/dL (ref 6.0–8.3)

## 2022-12-23 LAB — LIPID PANEL
Cholesterol: 267 mg/dL — ABNORMAL HIGH (ref 0–200)
HDL: 45.8 mg/dL (ref >39.00)
NonHDL: 221.34
Total CHOL/HDL Ratio: 6
Triglycerides: 287 mg/dL — ABNORMAL HIGH (ref 0.0–149.0)
VLDL: 57.4 mg/dL — ABNORMAL HIGH (ref 0.0–40.0)

## 2022-12-23 LAB — HEMOGLOBIN A1C: Hgb A1c MFr Bld: 6 % (ref 4.6–6.5)

## 2022-12-23 LAB — TSH: TSH: 1.93 u[IU]/mL (ref 0.35–5.50)

## 2022-12-23 LAB — LDL CHOLESTEROL, DIRECT: Direct LDL: 176 mg/dL

## 2022-12-27 ENCOUNTER — Ambulatory Visit (INDEPENDENT_AMBULATORY_CARE_PROVIDER_SITE_OTHER): Payer: Medicare Other | Admitting: Internal Medicine

## 2022-12-27 ENCOUNTER — Encounter: Payer: Medicare Other | Admitting: Internal Medicine

## 2022-12-27 ENCOUNTER — Encounter: Payer: Self-pay | Admitting: Internal Medicine

## 2022-12-27 VITALS — BP 132/76 | HR 87 | Temp 97.9°F | Resp 16 | Ht 65.0 in | Wt 168.6 lb

## 2022-12-27 DIAGNOSIS — G8929 Other chronic pain: Secondary | ICD-10-CM

## 2022-12-27 DIAGNOSIS — I10 Essential (primary) hypertension: Secondary | ICD-10-CM

## 2022-12-27 DIAGNOSIS — R739 Hyperglycemia, unspecified: Secondary | ICD-10-CM

## 2022-12-27 DIAGNOSIS — F419 Anxiety disorder, unspecified: Secondary | ICD-10-CM | POA: Diagnosis not present

## 2022-12-27 DIAGNOSIS — M48062 Spinal stenosis, lumbar region with neurogenic claudication: Secondary | ICD-10-CM | POA: Diagnosis not present

## 2022-12-27 DIAGNOSIS — I700 Atherosclerosis of aorta: Secondary | ICD-10-CM

## 2022-12-27 DIAGNOSIS — Z0000 Encounter for general adult medical examination without abnormal findings: Secondary | ICD-10-CM | POA: Diagnosis not present

## 2022-12-27 DIAGNOSIS — F320 Major depressive disorder, single episode, mild: Secondary | ICD-10-CM

## 2022-12-27 DIAGNOSIS — E7800 Pure hypercholesterolemia, unspecified: Secondary | ICD-10-CM

## 2022-12-27 DIAGNOSIS — K219 Gastro-esophageal reflux disease without esophagitis: Secondary | ICD-10-CM

## 2022-12-27 DIAGNOSIS — Z87891 Personal history of nicotine dependence: Secondary | ICD-10-CM

## 2022-12-27 DIAGNOSIS — E538 Deficiency of other specified B group vitamins: Secondary | ICD-10-CM

## 2022-12-27 DIAGNOSIS — Z86010 Personal history of colonic polyps: Secondary | ICD-10-CM | POA: Diagnosis not present

## 2022-12-27 DIAGNOSIS — M549 Dorsalgia, unspecified: Secondary | ICD-10-CM | POA: Diagnosis not present

## 2022-12-27 DIAGNOSIS — R41840 Attention and concentration deficit: Secondary | ICD-10-CM

## 2022-12-27 MED ORDER — CYANOCOBALAMIN 1000 MCG/ML IJ SOLN
1000.0000 ug | Freq: Once | INTRAMUSCULAR | Status: AC
  Administered 2022-12-27: 1000 ug via INTRAMUSCULAR

## 2022-12-27 NOTE — Assessment & Plan Note (Addendum)
Physical today 12/27/22.  Mammogram 03/30/22 - Birads I.  Colonnoscopy 09/2015.  Recommend f/u in 5 years.  Saw Dr Bary Castilla.  Recommended cologuard. Recommended holding colonoscopy until 2025-2026.

## 2022-12-27 NOTE — Assessment & Plan Note (Signed)
Low cholesterol diet and exercise.  Continue pravastatin. Also on praluent.  Follow lipid panel and liver function tests.  Lab Results  Component Value Date   CHOL 267 (H) 12/23/2022   HDL 45.80 12/23/2022   LDLCALC 97 04/05/2022   LDLDIRECT 176.0 12/23/2022   TRIG 287.0 (H) 12/23/2022   CHOLHDL 6 12/23/2022

## 2022-12-27 NOTE — Progress Notes (Signed)
Subjective:    Patient ID: Angel Riddle, female    DOB: 05/25/1956, 67 y.o.   MRN: [id number]  Patient here for  Chief Complaint  Patient presents with   Annual Exam    HPI With past history of hypercholesterolemia and hypertension, she comes in today to follow up on these issues as well as for a complete physical exam.  Has had persistent back/leg issues.  Seeing Dr Arnoldo Morale.  Also seeing ortho - 10/12/22 - bilateral thumb pain - [facility] arthritis.  Placed in thumb spica splints.  S/p injections.  Also evaluated 11/02/22 - left shoulder pain. S/p injection.  Discussed surgery. Referred to Dr Jeannie Fend [facility]).  Has also been dealing with increased stress.  Husband passed 10/19/22. She fell the following day down two steps and landed on concrete.  Saw ortho 11/02/22 - as outlined - regarding her shoulder.  Increased back pain since the fall.  Evaluated [facility] - 11/23/22.  Overall she feels she is handling things relatively well.  Has good support.  No chest pain or sob reported.  No abdominal pain reported.  Does report she has had issues with focusing and concentrating.  Hard to complete tasks.  Would like to be tested for ADHD.     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   Chronic back pain    degenerative disc disease   Degenerative disc disease, lumbar    Depression    Dyspnea    with exertion / pain   Gallstones    GERD (gastroesophageal reflux disease)    Heart murmur    followed by PCP   Hx of colonic polyp    Hx: UTI (urinary tract infection)    Hypercholesterolemia    Hypertension    Panic attacks    H/O   Wears dentures    partial upper and lower   Past Surgical History:  Procedure Laterality Date   ABDOMINAL HYSTERECTOMY  1992   partial   BACK SURGERY     lumbar fusion   CATARACT EXTRACTION Bilateral 2010,2011   CHOLECYSTECTOMY  04-19-14   COLONOSCOPY  07/02/11    DR. Byrnett   COLONOSCOPY WITH PROPOFOL N/A 09/25/2015   Procedure: COLONOSCOPY WITH PROPOFOL;  Surgeon: Lucilla Lame, MD;  Location: [place];  Service: Endoscopy;  Laterality: N/A;   FOOT SURGERY Right 2009   MOUTH SURGERY     POLYPECTOMY  09/25/2015   Procedure: POLYPECTOMY;  Surgeon: Lucilla Lame, MD;  Location: [facility];  Service: Endoscopy;;   skin surgery Left 08/27/13   Basal cell removal-left nostril   TONSILLECTOMY AND ADENOIDECTOMY  1969   Family History  Problem Relation Age of Onset   Hyperlipidemia Mother    Hypertension Mother    Alcohol abuse Father    Hyperlipidemia Father    Heart disease Father    Diabetes Father    Hyperlipidemia Sister    Lung cancer Brother    Hyperlipidemia Brother    Breast cancer Neg Hx    Social History   Socioeconomic History   Marital status: Married    Spouse name: Not on file   Number of children: 2   Years of education: Not on file   Highest education level: Not on file  Occupational History   Not on file  Tobacco Use   Smoking status: Every Day  Packs/day: 1.00    Years: 37.00    Total pack years: 37.00    Types: Cigarettes    Start date: 08/07/2019    Last attempt to quit: 05/25/2021    Years since quitting: 1.6   Smokeless tobacco: Never   Tobacco comments:    Smokes 1/2 PPD   Vaping Use   Vaping Use: Former  Substance and Sexual Activity   Alcohol use: Yes    Alcohol/week: 1.0 standard drink of alcohol    Types: 1 Glasses of wine per week   Drug use: No   Sexual activity: Not on file  Other Topics Concern   Not on file  Social History Narrative   Not on file   Social Determinants of Health   Financial Resource Strain: Low Risk  (03/19/2022)   Overall Financial Resource Strain (CARDIA)    Difficulty of Paying Living Expenses: Not very hard  Food Insecurity: No Food Insecurity (03/19/2022)   Hunger Vital Sign    Worried About Running Out of Food in the Last Year: Never true    Ran  Out of Food in the Last Year: Never true  Transportation Needs: No Transportation Needs (03/19/2022)   PRAPARE - Hydrologist (Medical): No    Lack of Transportation (Non-Medical): No  Physical Activity: Not on file  Stress: Stress Concern Present (03/19/2022)   [place]    Feeling of Stress : To some extent  Social Connections: Unknown (03/19/2022)   Social Connection and Isolation Panel [NHANES]    Frequency of Communication with Friends and Family: Not on file    Frequency of Social Gatherings with Friends and Family: Not on file    Attends Religious Services: Not on file    Active Member of Clubs or Organizations: Not on file    Attends Archivist Meetings: Not on file    Marital Status: Married     Review of Systems  Constitutional:  Negative for appetite change and unexpected weight change.  HENT:  Negative for congestion, sinus pressure and sore throat.   Eyes:  Negative for pain and visual disturbance.  Respiratory:  Negative for cough, chest tightness and shortness of breath.   Cardiovascular:  Negative for chest pain, palpitations and leg swelling.  Gastrointestinal:  Negative for abdominal pain, diarrhea, nausea and vomiting.  Genitourinary:  Negative for difficulty urinating and dysuria.  Musculoskeletal:  Negative for joint swelling and myalgias.  Skin:  Negative for color change and rash.  Neurological:  Negative for dizziness and headaches.  Hematological:  Negative for adenopathy. Does not bruise/bleed easily.  Psychiatric/Behavioral:  Negative for agitation and dysphoric mood.        Objective:     BP 132/76   Pulse 87   Temp 97.9 F (36.6 C)   Resp 16   Ht '5\' 5"'$  (1.651 m)   Wt 168 lb 9.6 oz (76.5 kg)   SpO2 98%   BMI 28.06 kg/m  Wt Readings from Last 3 Encounters:  12/31/22 166 lb (75.3 kg)  12/27/22 168 lb 9.6 oz (76.5 kg)  09/21/22 170 lb (77.1  kg)    Physical Exam Vitals reviewed.  Constitutional:      General: She is not in acute distress.    Appearance: Normal appearance. She is well-developed.  HENT:     Head: Normocephalic and atraumatic.     Right Ear: External ear normal.     Left  Ear: External ear normal.  Eyes:     General: No scleral icterus.       Right eye: No discharge.        Left eye: No discharge.     Conjunctiva/sclera: Conjunctivae normal.  Neck:     Thyroid: No thyromegaly.  Cardiovascular:     Rate and Rhythm: Normal rate and regular rhythm.  Pulmonary:     Effort: No tachypnea, accessory muscle usage or respiratory distress.     Breath sounds: Normal breath sounds. No decreased breath sounds or wheezing.  Chest:  Breasts:    Right: No inverted nipple, mass, nipple discharge or tenderness (no axillary adenopathy).     Left: No inverted nipple, mass, nipple discharge or tenderness (no axilarry adenopathy).  Abdominal:     General: Bowel sounds are normal.     Palpations: Abdomen is soft.     Tenderness: There is no abdominal tenderness.  Musculoskeletal:        General: No swelling or tenderness.     Cervical back: Neck supple.  Lymphadenopathy:     Cervical: No cervical adenopathy.  Skin:    Findings: No erythema or rash.  Neurological:     Mental Status: She is alert and oriented to person, place, and time.  Psychiatric:        Mood and Affect: Mood normal.        Behavior: Behavior normal.      Outpatient Encounter Medications as of 12/27/2022  Medication Sig   albuterol (PROVENTIL) (2.5 MG/3ML) 0.083% nebulizer solution Inhale into the lungs.   albuterol (VENTOLIN HFA) 108 (90 Base) MCG/ACT inhaler Inhale into the lungs.   Alirocumab (PRALUENT) 75 MG/ML SOAJ Inject 1 pen (75 mg) into the skin every 14 days.   ALPRAZolam (XANAX) 0.5 MG tablet Take 1 tablet (0.5 mg total) by mouth daily as needed for anxiety.   aspirin EC 81 MG tablet Take 1 tablet (81 mg total) by mouth daily.  Swallow whole.   estradiol (ESTRACE) 0.1 MG/GM vaginal cream Estrogen Cream Instruction Discard applicator Apply pea sized amount to tip of finger to urethra before bed. Wash hands well after application. Use Monday, Wednesday and Friday   FLUoxetine (PROZAC) 40 MG capsule Take 1 capsule (40 mg total) by mouth daily.   hydrochlorothiazide (MICROZIDE) 12.5 MG capsule Take 1 capsule (12.5 mg total) by mouth daily.   HYDROcodone-acetaminophen (NORCO) 7.5-325 MG tablet Take 1.5 tablets by mouth as needed. Using 1-2 times daily   meloxicam (MOBIC) 15 MG tablet Take 1 tablet (15 mg total) by mouth daily.   metoprolol tartrate (LOPRESSOR) 25 MG tablet Take 1 tablet (25 mg total) by mouth daily.   pantoprazole (PROTONIX) 40 MG tablet Take 1 tablet (40 mg total) by mouth daily.   potassium chloride (KLOR-CON) 10 MEQ tablet Take 1 tablet (10 mEq total) by mouth daily.   pravastatin (PRAVACHOL) 10 MG tablet Take 1 tablet (10 mg total) by mouth daily.   [DISCONTINUED] DULoxetine (CYMBALTA) 60 MG capsule Take 1 capsule (60 mg total) by mouth daily.   [EXPIRED] cyanocobalamin (VITAMIN B12) injection 1,000 mcg    No facility-administered encounter medications on file as of 12/27/2022.     Lab Results  Component Value Date   WBC 9.4 07/16/2022   HGB 14.1 07/16/2022   HCT 41.7 07/16/2022   PLT 348.0 07/16/2022   GLUCOSE 95 12/23/2022   CHOL 267 (H) 12/23/2022   TRIG 287.0 (H) 12/23/2022   HDL 45.80 12/23/2022  LDLDIRECT 176.0 12/23/2022   LDLCALC 97 04/05/2022   ALT 9 12/23/2022   AST 17 12/23/2022   NA 140 12/23/2022   K 4.1 12/23/2022   CL 101 12/23/2022   CREATININE 0.93 12/23/2022   BUN 17 12/23/2022   CO2 27 12/23/2022   TSH 1.93 12/23/2022   HGBA1C 6.0 12/23/2022    DG Bone Density  Result Date: 08/16/2022 EXAM: DUAL X-RAY ABSORPTIOMETRY (DXA) FOR BONE MINERAL DENSITY IMPRESSION: Your patient Angel Riddle completed a BMD test on 08/16/2022 using the [place] (software  version: 14.10) manufactured by [employer]. The following summarizes the results of our evaluation. Technologist:VLM PATIENT BIOGRAPHICAL: Name: Angel Riddle, Angel Riddle Patient ID: [id number] Birth Date: 1956-03-03 Height: 64.5 in. Gender: Female Exam Date: 08/16/2022 Weight: 173.0 lbs. Indications: Caucasian, Postmenopausal Fractures: Treatments: Albuterol DENSITOMETRY RESULTS: Site         Region     Measured Date Measured Age WHO Classification Young Adult T-score BMD         %Change vs. Previous Significant Change (*) AP Spine L1-L2 08/16/2022 66.5 Normal 0.3 1.215 g/cm2 - - DualFemur Neck Right 08/16/2022 66.5 Osteopenia -1.8 0.781 g/cm2 - - Left Forearm Radius 33% 08[age]16/2022 66.5 Osteopenia -1.7 0.729 g/cm2 - - ASSESSMENT: The BMD measured at Femur Neck Right is 0.781 g/cm2 with a T-score of -1.8. This patient is considered osteopenic according to [place] [facility]) criteria. L-3 & L-4 were excluded due to degenerative changes. The scan quality is good. World Pharmacologist [facility]) criteria for post-menopausal, Caucasian Women: Normal:                   T-score at or above -1 SD Osteopenia/low bone mass: T-score between -1 and -2.5 SD Osteoporosis:             T-score at or below -2.5 SD RECOMMENDATIONS: 1. All patients should optimize calcium and vitamin D intake. 2. Consider FDA-approved medical therapies in postmenopausal women and men aged 33 years and older, based on the following: a. A hip or vertebral(clinical or morphometric) fracture b. T-score < -2.5 at the femoral neck or spine after appropriate evaluation to exclude secondary causes c. Low bone mass (T-score between -1.0 and -2.5 at the femoral neck or spine) and a 10-year probability of a hip fracture > 3% or a 10-year probability of a major osteoporosis-related fracture > 20% based on the [place]-adapted WHO algorithm 3. Clinician judgment and/or patient preferences may indicate treatment for people with 10-year fracture  probabilities above or below these levels FOLLOW-UP: People with diagnosed cases of osteoporosis or at high risk for fracture should have regular bone mineral density tests. For patients eligible for Medicare, routine testing is allowed once every 2 years. The testing frequency can be increased to one year for patients who have rapidly progressing disease, those who are receiving or discontinuing medical therapy to restore bone mass, or have additional risk factors. I have reviewed this report, and agree with the above findings. [facility] Dear Einar Pheasant, Your patient Angel Riddle completed a FRAX assessment on 08/16/2022 using the [place] (analysis version: 14.10) manufactured by [employer]. The following summarizes the results of our evaluation. PATIENT BIOGRAPHICAL: Name: Angel Riddle, Angel Riddle Patient ID: [id number] Birth Date: [birth year] Height:    64.5 in. Gender:     Female    [age]       Weight:    173.0  lbs. Ethnicity:  White                            Exam Date: 08/16/2022 FRAX* RESULTS:  (version: 3.5) 10-year Probability of Fracture1 Major Osteoporotic Fracture2 Hip Fracture 10.3% 1.5% Population: [place] (Caucasian) Risk Factors: None Based on Femur (Right) Neck BMD 1 -The 10-year probability of fracture may be lower than reported if the patient has received treatment. 2 -Major Osteoporotic Fracture: Clinical Spine, Forearm, Hip or Shoulder *FRAX is a Materials engineer of the [employer] for Metabolic Bone Disease, a [place] (WHO) [facility]. ASSESSMENT: The probability of a major osteoporotic fracture is 10.3% within the next ten years. The probability of a hip fracture is 1.5% within the next ten years. . Electronically Signed   By: Ammie Ferrier M.D.   On: 08/16/2022 14:34       Assessment & Plan:  Health care maintenance Assessment & Plan: Physical today 12/27/22.  Mammogram 03/30/22 -  Birads I.  Colonnoscopy 09/2015.  Recommend f/u in 5 years.  Saw Dr Bary Castilla.  Recommended cologuard. Recommended holding colonoscopy until 2025-2026.   Hypercholesterolemia Assessment & Plan: Low cholesterol diet and exercise.  Continue pravastatin. Also on praluent.  Follow lipid panel and liver function tests.  Lab Results  Component Value Date   CHOL 267 (H) 12/23/2022   HDL 45.80 12/23/2022   LDLCALC 97 04/05/2022   LDLDIRECT 176.0 12/23/2022   TRIG 287.0 (H) 12/23/2022   CHOLHDL 6 12/23/2022    Orders: -     Lipid panel; Future -     Hepatic function panel; Future  Primary hypertension Assessment & Plan: Continue hctz and metoprolol.  Recheck wnl. Follow pressures.  Follow metabolic panel.   Orders: -     Basic metabolic panel; Future  Hyperglycemia Assessment & Plan: Low carb diet and exercise.  Follow met b and a1c.  Lab Results  Component Value Date   HGBA1C 6.0 12/23/2022    Orders: -     Hemoglobin A1c; Future  B12 deficiency Assessment & Plan: Continue b12 injections.   Orders: -     Cyanocobalamin  Anxiety Assessment & Plan: On cymbalta and prozac.   Follow closely.  Previously tried buspar and gabapentin - did not feel helped.  Has xanax .'5mg'$  to use sparingly.  F/u with psych as discussed     Aortic atherosclerosis ([place]) Assessment & Plan: Continue pravastatin and praluent.    Chronic midline back pain, unspecified back location Assessment & Plan: S/p previous surgery. Persistent pain.  Seeing [facility].    Depression, major, single episode, mild ([facility]) Assessment & Plan: Continues on prozac and cymbalta.  She is limiting xanax usage. Psychiatry.    Gastroesophageal reflux disease, unspecified whether esophagitis present Assessment & Plan: No upper symptoms reported.  Continue prilosec.     History of colonic polyps Assessment & Plan: Saw Dr Bary Castilla.  Recommended cologuard. Recommended holding colonoscopy until 2025-2026.     Lumbar  stenosis with neurogenic claudication Assessment & Plan: Seeing Dr Arnoldo Morale.  Evaluated 08/31/22 - medrol dosepak. Pain management.    Personal history of tobacco use, presenting hazards to health Assessment & Plan: Has declined to quit.  Follow.    Poor concentration -     Ambulatory referral to Psychiatry     Einar Pheasant, MD

## 2022-12-31 ENCOUNTER — Encounter: Payer: Self-pay | Admitting: Nurse Practitioner

## 2022-12-31 ENCOUNTER — Ambulatory Visit (INDEPENDENT_AMBULATORY_CARE_PROVIDER_SITE_OTHER): Payer: Medicare Other | Admitting: Nurse Practitioner

## 2022-12-31 ENCOUNTER — Telehealth: Payer: Self-pay

## 2022-12-31 VITALS — BP 110/62 | HR 80 | Temp 98.6°F | Ht 65.0 in | Wt 166.0 lb

## 2022-12-31 DIAGNOSIS — J019 Acute sinusitis, unspecified: Secondary | ICD-10-CM | POA: Diagnosis not present

## 2022-12-31 MED ORDER — DULOXETINE HCL 60 MG PO CPEP: 60.0000 mg | ORAL_CAPSULE | Freq: Every day | ORAL | 2 refills | Status: DC

## 2022-12-31 MED ORDER — AMOXICILLIN 500 MG PO TABS: 500.0000 mg | ORAL_TABLET | Freq: Two times a day (BID) | ORAL | 0 refills | Status: DC

## 2022-12-31 MED ORDER — PREDNISONE 20 MG PO TABS: 20.0000 mg | ORAL_TABLET | Freq: Every day | ORAL | 0 refills | Status: DC

## 2022-12-31 NOTE — Patient Instructions (Signed)
Started on Amoxicillin 500 mg twice a day for 7 days  and Prednisone 20 mg once a day for 5 days. Increase hydration,  Use humidifier and steam.

## 2022-12-31 NOTE — Progress Notes (Signed)
Established Patient Office Visit  Subjective:  Patient ID: Angel Riddle, female    DOB: 1956/04/18  Age: 67 y.o. MRN: [id number]  CC:  Chief Complaint  Patient presents with   Sinusitis    Congestion and drainage x 1 day      HPI  Angel Riddle presents for:  Sinus Problem This is a new problem. The current episode started in the past 7 days. The problem is unchanged. There has been no fever. Associated symptoms include chills, congestion, coughing and sinus pressure. Pertinent negatives include no headaches or sore throat. Past treatments include nothing.     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   Chronic back pain    degenerative disc disease   Degenerative disc disease, lumbar    Depression    Dyspnea    with exertion / pain   Gallstones    GERD (gastroesophageal reflux disease)    Heart murmur    followed by PCP   Hx of colonic polyp    Hx: UTI (urinary tract infection)    Hypercholesterolemia    Hypertension    Panic attacks    H/O   Wears dentures    partial upper and lower    Past Surgical History:  Procedure Laterality Date   ABDOMINAL HYSTERECTOMY  1992   partial   BACK SURGERY     lumbar fusion   CATARACT EXTRACTION Bilateral 2010,2011   CHOLECYSTECTOMY  04-19-14   COLONOSCOPY  07/02/11   DR. Byrnett   COLONOSCOPY WITH PROPOFOL N/A 09/25/2015   Procedure: COLONOSCOPY WITH PROPOFOL;  Surgeon: Lucilla Lame, MD;  Location: [place];  Service: Endoscopy;  Laterality: N/A;   FOOT SURGERY Right 2009   MOUTH SURGERY     POLYPECTOMY  09/25/2015   Procedure: POLYPECTOMY;  Surgeon: Lucilla Lame, MD;  Location: [place];  Service: Endoscopy;;   skin surgery Left 08/27/13   Basal cell removal-left nostril   TONSILLECTOMY AND ADENOIDECTOMY  1969    Family History  Problem Relation Age of Onset   Hyperlipidemia Mother    Hypertension Mother    Alcohol  abuse Father    Hyperlipidemia Father    Heart disease Father    Diabetes Father    Hyperlipidemia Sister    Lung cancer Brother    Hyperlipidemia Brother    Breast cancer Neg Hx     Social History   Socioeconomic History   Marital status: Married    Spouse name: Not on file   Number of children: 2   Years of education: Not on file   Highest education level: Not on file  Occupational History   Not on file  Tobacco Use   Smoking status: Every Day    Packs/day: 1.00    Years: 37.00    Total pack years: 37.00    Types: Cigarettes    Start date: 08/07/2019    Last attempt to quit: 05/25/2021    Years since quitting: 1.6   Smokeless tobacco: Never   Tobacco comments:    Smokes 1/2 PPD   Vaping Use   Vaping Use: Former  Substance and Sexual Activity   Alcohol use: Yes    Alcohol/week: 1.0 standard drink of alcohol    Types: 1 Glasses of wine per week   Drug use: No   Sexual activity: Not on file  Other Topics Concern   Not on file  Social History Narrative   Not on file   Social Determinants of Health   Financial Resource Strain: Low Risk  (03/19/2022)   Overall Financial Resource Strain (CARDIA)    Difficulty of Paying Living Expenses: Not very hard  Food Insecurity: No Food Insecurity (03/19/2022)   Hunger Vital Sign    Worried About Running Out of Food in the Last Year: Never true    Ran Out of Food in the Last Year: Never true  Transportation Needs: No Transportation Needs (03/19/2022)   PRAPARE - Hydrologist (Medical): No    Lack of Transportation (Non-Medical): No  Physical Activity: Not on file  Stress: Stress Concern Present (03/19/2022)   [place]    Feeling of Stress : To some extent  Social Connections: Unknown (03/19/2022)   Social Connection and Isolation Panel [NHANES]    Frequency of Communication with Friends and Family: Not on file    Frequency of Social  Gatherings with Friends and Family: Not on file    Attends Religious Services: Not on file    Active Member of Clubs or Organizations: Not on file    Attends Archivist Meetings: Not on file    Marital Status: Married  Intimate Partner Violence: Not At Risk (03/19/2022)   Humiliation, Afraid, Rape, and Kick questionnaire    Fear of Current or Ex-Partner: No    Emotionally Abused: No    Physically Abused: No    Sexually Abused: No     Outpatient Medications Prior to Visit  Medication Sig Dispense Refill   albuterol (PROVENTIL) (2.5 MG/3ML) 0.083% nebulizer solution Inhale into the lungs.     albuterol (VENTOLIN HFA) 108 (90 Base) MCG/ACT inhaler Inhale into the lungs.     Alirocumab (PRALUENT) 75 MG/ML SOAJ Inject 1 pen (75 mg) into the skin every 14 days. 6 mL 3   ALPRAZolam (XANAX) 0.5 MG tablet Take 1 tablet (0.5 mg total) by mouth daily as needed for anxiety. 30 tablet 0   aspirin EC 81 MG tablet Take 1 tablet (81 mg total) by mouth daily. Swallow whole. 30 tablet 0   estradiol (ESTRACE) 0.1 MG/GM vaginal cream Estrogen Cream Instruction Discard applicator Apply pea sized amount to tip of finger to urethra before bed. Wash hands well after application. Use Monday, Wednesday and Friday 42.5 g 12   FLUoxetine (PROZAC) 40 MG capsule Take 1 capsule (40 mg total) by mouth daily. 90 capsule 0   hydrochlorothiazide (MICROZIDE) 12.5 MG capsule Take 1 capsule (12.5 mg total) by mouth daily. 30 capsule 0   HYDROcodone-acetaminophen (NORCO) 7.5-325 MG tablet Take 1.5 tablets by mouth as needed. Using 1-2 times daily     meloxicam (MOBIC) 15 MG tablet Take 1 tablet (15 mg total) by mouth daily. 30 tablet 0   metoprolol tartrate (LOPRESSOR) 25 MG tablet Take 1 tablet (25 mg total) by mouth daily. 90 tablet 3   pantoprazole (PROTONIX) 40 MG tablet Take 1 tablet (40 mg total) by mouth daily. 30 tablet 5   potassium chloride (KLOR-CON) 10 MEQ tablet Take 1 tablet (10 mEq total) by mouth  daily. 90 tablet 0   pravastatin (PRAVACHOL) 10 MG tablet Take 1 tablet (10 mg total) by mouth daily. 90 tablet 0   DULoxetine (CYMBALTA) 60 MG capsule Take 1 capsule (60 mg total) by mouth daily. 30 capsule 2   No facility-administered  medications prior to visit.    Allergies  Allergen Reactions   Doxycycline Nausea And Vomiting and Nausea Only    Other reaction(s): Vomiting   Gadolinium Nausea And Vomiting   Gadolinium Derivatives     Other reaction(s): Other (See Comments)   Iodinated Contrast Media Nausea And Vomiting and Other (See Comments)     Eyes turned blood red Other reaction(s): Other (See Comments), Vomiting Redness of the eyes During a CT scan of the kidneys    Levofloxacin     Other reaction(s): Muscle Pain   Silicone     Other reaction(s): Other (See Comments) "Pulls skin"; anything that does not pull is recommended   Levaquin [Levofloxacin In D5w] Other (See Comments)    Muscle pain   Adhesive [Tape] Other (See Comments)    Pulls skin, anything that does not pull is recommended     ROS Review of Systems  Constitutional:  Positive for chills.  HENT:  Positive for congestion and sinus pressure. Negative for sore throat.   Respiratory:  Positive for cough.   Cardiovascular: Negative.   Neurological:  Negative for headaches.      Objective:    Physical Exam Constitutional:      Appearance: Normal appearance.  HENT:     Right Ear: Tympanic membrane normal.     Left Ear: Tympanic membrane normal.     Nose:     Right Sinus: Maxillary sinus tenderness present.     Left Sinus: Maxillary sinus tenderness present.     Mouth/Throat:     Mouth: Mucous membranes are moist.     Pharynx: Oropharynx is clear. No oropharyngeal exudate.  Eyes:     Pupils: Pupils are equal, round, and reactive to light.  Cardiovascular:     Rate and Rhythm: Normal rate and regular rhythm.     Pulses: Normal pulses.     Heart sounds: Normal heart sounds.  Pulmonary:      Effort: Pulmonary effort is normal.     Breath sounds: Normal breath sounds.  Abdominal:     General: Bowel sounds are normal.     Palpations: Abdomen is soft. There is no mass.     Tenderness: There is no abdominal tenderness.  Neurological:     Mental Status: She is alert.     BP 110/62   Pulse 80   Temp 98.6 F (37 C) (Oral)   Ht '5\' 5"'$  (1.651 m)   Wt 166 lb (75.3 kg)   SpO2 97%   BMI 27.62 kg/m  Wt Readings from Last 3 Encounters:  12/31/22 166 lb (75.3 kg)  12/27/22 168 lb 9.6 oz (76.5 kg)  09/21/22 170 lb (77.1 kg)     Health Maintenance  Topic Date Due   Pneumonia Vaccine 70+ Years old (1 - PCV) 01/14/2023 (Originally 02/04/1962)   COLONOSCOPY (Pts 45-51yr Insurance coverage will need to be confirmed)  01/14/2023 (Originally 09/24/2020)   Zoster Vaccines- Shingrix (1 of 2) 03/28/2023 (Originally 02/04/2006)   Medicare Annual Wellness (AWV)  03/20/2023   Lung Cancer Screening  03/30/2023   MAMMOGRAM  03/31/2023   DTaP/Tdap/Td (2 - Td or Tdap) 06/15/2028   INFLUENZA VACCINE  Completed   DEXA SCAN  Completed   Hepatitis C Screening  Completed   HPV VACCINES  Aged Out   COVID-19 Vaccine  Discontinued    There are no preventive care reminders to display for this patient.  Lab Results  Component Value Date   TSH 1.93 12/23/2022  Lab Results  Component Value Date   WBC 9.4 07/16/2022   HGB 14.1 07/16/2022   HCT 41.7 07/16/2022   MCV 86.7 07/16/2022   PLT 348.0 07/16/2022   Lab Results  Component Value Date   NA 140 12/23/2022   K 4.1 12/23/2022   CO2 27 12/23/2022   GLUCOSE 95 12/23/2022   BUN 17 12/23/2022   CREATININE 0.93 12/23/2022   BILITOT 0.5 12/23/2022   ALKPHOS 100 12/23/2022   AST 17 12/23/2022   ALT 9 12/23/2022   PROT 6.7 12/23/2022   ALBUMIN 4.1 12/23/2022   CALCIUM 9.3 12/23/2022   ANIONGAP 9 09/18/2017   GFR 63.88 12/23/2022   Lab Results  Component Value Date   CHOL 267 (H) 12/23/2022   Lab Results  Component Value Date    HDL 45.80 12/23/2022   Lab Results  Component Value Date   LDLCALC 97 04/05/2022   Lab Results  Component Value Date   TRIG 287.0 (H) 12/23/2022   Lab Results  Component Value Date   CHOLHDL 6 12/23/2022   Lab Results  Component Value Date   HGBA1C 6.0 12/23/2022      Assessment & Plan:   Problem List Items Addressed This Visit       Respiratory   Acute non-recurrent sinusitis - Primary    Started on amoxicillin 500 mg twice a day for 7 days and prednisone 20 mg once a day for 5 days. Encouraged to increase hydration, warm tea and drinks. Encouraged to use humidifier and steam bath. Encouraged her to stop smoking. If symptoms persist please call the office for follow-up.      Relevant Medications   amoxicillin (AMOXIL) 500 MG tablet   predniSONE (DELTASONE) 20 MG tablet     Meds ordered this encounter  Medications   amoxicillin (AMOXIL) 500 MG tablet    Sig: Take 1 tablet (500 mg total) by mouth 2 (two) times daily.    Dispense:  14 tablet    Refill:  0   predniSONE (DELTASONE) 20 MG tablet    Sig: Take 1 tablet (20 mg total) by mouth daily with breakfast.    Dispense:  5 tablet    Refill:  0     Follow-up: No follow-ups on file.    Theresia Lo, NP

## 2022-12-31 NOTE — Assessment & Plan Note (Signed)
Started on amoxicillin 500 mg twice a day for 7 days and prednisone 20 mg once a day for 5 days. Encouraged to increase hydration, warm tea and drinks. Encouraged to use humidifier and steam bath. Encouraged her to stop smoking. If symptoms persist please call the office for follow-up.

## 2023-01-02 ENCOUNTER — Encounter: Payer: Self-pay | Admitting: Internal Medicine

## 2023-01-02 NOTE — Assessment & Plan Note (Signed)
Seeing Dr Arnoldo Morale.  Evaluated 08/31/22 - medrol dosepak. Pain management.

## 2023-01-02 NOTE — Assessment & Plan Note (Signed)
Has declined to quit.  Follow.

## 2023-01-02 NOTE — Assessment & Plan Note (Signed)
Continues on prozac and cymbalta.  She is limiting xanax usage. Psychiatry.

## 2023-01-02 NOTE — Assessment & Plan Note (Signed)
Low carb diet and exercise.  Follow met b and a1c.  Lab Results  Component Value Date   HGBA1C 6.0 12/23/2022

## 2023-01-02 NOTE — Assessment & Plan Note (Signed)
Saw Dr Bary Castilla.  Recommended cologuard. Recommended holding colonoscopy until 2025-2026.

## 2023-01-02 NOTE — Assessment & Plan Note (Signed)
Continue hctz and metoprolol.  Recheck wnl. Follow pressures.  Follow metabolic panel.

## 2023-01-02 NOTE — Assessment & Plan Note (Signed)
S/p previous surgery. Persistent pain.  Seeing [facility].

## 2023-01-02 NOTE — Assessment & Plan Note (Signed)
No upper symptoms reported.  Continue prilosec.  

## 2023-01-02 NOTE — Assessment & Plan Note (Addendum)
Continue pravastatin and praluent.

## 2023-01-02 NOTE — Assessment & Plan Note (Addendum)
On cymbalta and prozac.   Follow closely.  Previously tried buspar and gabapentin - did not feel helped.  Has xanax .'5mg'$  to use sparingly.  F/u with psych as discussed

## 2023-01-02 NOTE — Assessment & Plan Note (Signed)
Continue b12 injections.  

## 2023-01-03 NOTE — Addendum Note (Signed)
Addended by: Alisa Graff on: 01/03/2023 05:25 PM   Modules accepted: Orders

## 2023-01-05 DIAGNOSIS — M19012 Primary osteoarthritis, left shoulder: Secondary | ICD-10-CM | POA: Diagnosis not present

## 2023-01-14 DIAGNOSIS — M180 Bilateral primary osteoarthritis of first carpometacarpal joints: Secondary | ICD-10-CM | POA: Diagnosis not present

## 2023-01-14 DIAGNOSIS — M1812 Unilateral primary osteoarthritis of first carpometacarpal joint, left hand: Secondary | ICD-10-CM | POA: Diagnosis not present

## 2023-01-15 ENCOUNTER — Other Ambulatory Visit: Payer: Self-pay | Admitting: Internal Medicine

## 2023-01-15 DIAGNOSIS — I10 Essential (primary) hypertension: Secondary | ICD-10-CM

## 2023-01-18 DIAGNOSIS — M19012 Primary osteoarthritis, left shoulder: Secondary | ICD-10-CM | POA: Diagnosis not present

## 2023-01-21 ENCOUNTER — Other Ambulatory Visit: Payer: Self-pay | Admitting: Internal Medicine

## 2023-02-03 ENCOUNTER — Other Ambulatory Visit: Payer: Self-pay | Admitting: Internal Medicine

## 2023-02-04 NOTE — Telephone Encounter (Signed)
Error

## 2023-02-17 DIAGNOSIS — M25512 Pain in left shoulder: Secondary | ICD-10-CM | POA: Diagnosis not present

## 2023-02-17 DIAGNOSIS — M7542 Impingement syndrome of left shoulder: Secondary | ICD-10-CM | POA: Diagnosis not present

## 2023-02-21 ENCOUNTER — Other Ambulatory Visit: Payer: Self-pay | Admitting: Internal Medicine

## 2023-02-21 DIAGNOSIS — I10 Essential (primary) hypertension: Secondary | ICD-10-CM

## 2023-02-22 DIAGNOSIS — M7542 Impingement syndrome of left shoulder: Secondary | ICD-10-CM | POA: Diagnosis not present

## 2023-02-22 DIAGNOSIS — M25512 Pain in left shoulder: Secondary | ICD-10-CM | POA: Diagnosis not present

## 2023-02-28 ENCOUNTER — Other Ambulatory Visit: Payer: Self-pay | Admitting: Internal Medicine

## 2023-02-28 DIAGNOSIS — I10 Essential (primary) hypertension: Secondary | ICD-10-CM

## 2023-03-01 DIAGNOSIS — M4316 Spondylolisthesis, lumbar region: Secondary | ICD-10-CM | POA: Diagnosis not present

## 2023-03-02 ENCOUNTER — Other Ambulatory Visit: Payer: Self-pay | Admitting: Neurosurgery

## 2023-03-02 DIAGNOSIS — M4316 Spondylolisthesis, lumbar region: Secondary | ICD-10-CM

## 2023-03-08 ENCOUNTER — Ambulatory Visit
Admission: RE | Admit: 2023-03-08 | Discharge: 2023-03-08 | Disposition: A | Discharge status: Home / Self Care | Payer: Medicare Other | Source: Ambulatory Visit | Attending: Neurosurgery | Admitting: Neurosurgery

## 2023-03-08 DIAGNOSIS — M48061 Spinal stenosis, lumbar region without neurogenic claudication: Secondary | ICD-10-CM | POA: Diagnosis not present

## 2023-03-08 DIAGNOSIS — M4807 Spinal stenosis, lumbosacral region: Secondary | ICD-10-CM | POA: Diagnosis not present

## 2023-03-08 DIAGNOSIS — M545 Low back pain, unspecified: Secondary | ICD-10-CM | POA: Diagnosis not present

## 2023-03-08 DIAGNOSIS — M4316 Spondylolisthesis, lumbar region: Secondary | ICD-10-CM

## 2023-03-11 ENCOUNTER — Other Ambulatory Visit (HOSPITAL_COMMUNITY): Payer: Self-pay

## 2023-03-11 ENCOUNTER — Other Ambulatory Visit: Payer: Self-pay | Admitting: Internal Medicine

## 2023-03-11 NOTE — Telephone Encounter (Signed)
Requesting: alprazolam 0.5 mg Contract: No UDS: no Last Visit: 12/27/2022 Next Visit: 03/29/2023 Last Refill: 12/02/22  Please Advise

## 2023-03-16 ENCOUNTER — Telehealth: Payer: Self-pay | Admitting: Internal Medicine

## 2023-03-16 NOTE — Telephone Encounter (Signed)
Contacted Angel Riddle to schedule their annual wellness visit. Appointment made for 03/21/2023.  Thank you,  [facility] Support [facility] Direct dial  [phone]

## 2023-03-22 ENCOUNTER — Ambulatory Visit (INDEPENDENT_AMBULATORY_CARE_PROVIDER_SITE_OTHER): Payer: Medicare Other | Admitting: Internal Medicine

## 2023-03-22 DIAGNOSIS — I10 Essential (primary) hypertension: Secondary | ICD-10-CM

## 2023-03-23 ENCOUNTER — Encounter: Payer: Self-pay | Admitting: Internal Medicine

## 2023-03-23 ENCOUNTER — Other Ambulatory Visit: Payer: Self-pay | Admitting: Internal Medicine

## 2023-03-23 DIAGNOSIS — I10 Essential (primary) hypertension: Secondary | ICD-10-CM

## 2023-03-23 NOTE — Progress Notes (Signed)
Did not show for appt.  Appt rescheduled.

## 2023-03-25 ENCOUNTER — Other Ambulatory Visit (INDEPENDENT_AMBULATORY_CARE_PROVIDER_SITE_OTHER): Payer: Medicare Other

## 2023-03-25 DIAGNOSIS — R739 Hyperglycemia, unspecified: Secondary | ICD-10-CM

## 2023-03-25 DIAGNOSIS — I10 Essential (primary) hypertension: Secondary | ICD-10-CM

## 2023-03-25 DIAGNOSIS — E7800 Pure hypercholesterolemia, unspecified: Secondary | ICD-10-CM

## 2023-03-25 LAB — BASIC METABOLIC PANEL
BUN: 17 mg/dL (ref 6–23)
CO2: 27 mEq/L (ref 19–32)
Calcium: 9.6 mg/dL (ref 8.4–10.5)
Chloride: 101 mEq/L (ref 96–112)
Creatinine, Ser: 0.88 mg/dL (ref 0.40–1.20)
GFR: 68.14 mL/min (ref >60.00)
Glucose, Bld: 101 mg/dL — ABNORMAL HIGH (ref 70–99)
Potassium: 3.8 mEq/L (ref 3.5–5.1)
Sodium: 138 mEq/L (ref 135–145)

## 2023-03-25 LAB — LIPID PANEL
Cholesterol: 233 mg/dL — ABNORMAL HIGH (ref 0–200)
HDL: 48.6 mg/dL (ref >39.00)
NonHDL: 183.92
Total CHOL/HDL Ratio: 5
Triglycerides: 296 mg/dL — ABNORMAL HIGH (ref 0.0–149.0)
VLDL: 59.2 mg/dL — ABNORMAL HIGH (ref 0.0–40.0)

## 2023-03-25 LAB — HEPATIC FUNCTION PANEL
ALT: 8 U/L (ref 0–35)
AST: 19 U/L (ref 0–37)
Albumin: 4.4 g/dL (ref 3.5–5.2)
Alkaline Phosphatase: 95 U/L (ref 39–117)
Bilirubin, Direct: 0.1 mg/dL (ref 0.0–0.3)
Total Bilirubin: 0.6 mg/dL (ref 0.2–1.2)
Total Protein: 6.8 g/dL (ref 6.0–8.3)

## 2023-03-25 LAB — LDL CHOLESTEROL, DIRECT: Direct LDL: 150 mg/dL

## 2023-03-25 LAB — HEMOGLOBIN A1C: Hgb A1c MFr Bld: 6.1 % (ref 4.6–6.5)

## 2023-03-28 ENCOUNTER — Ambulatory Visit (INDEPENDENT_AMBULATORY_CARE_PROVIDER_SITE_OTHER): Payer: Medicare Other

## 2023-03-28 VITALS — Ht 65.0 in | Wt 166.0 lb

## 2023-03-28 DIAGNOSIS — Z1231 Encounter for screening mammogram for malignant neoplasm of breast: Secondary | ICD-10-CM | POA: Diagnosis not present

## 2023-03-28 DIAGNOSIS — Z0000 Encounter for general adult medical examination without abnormal findings: Secondary | ICD-10-CM

## 2023-03-28 NOTE — Progress Notes (Signed)
Subjective:   Angel Riddle is a 68 y.o. female who presents for Medicare Annual (Subsequent) preventive examination.  Review of Systems    No ROS.  Medicare Wellness Virtual Visit.  Visual/audio telehealth visit, UTA vital signs.   See social history for additional risk factors.   Cardiac Risk Factors include: advanced age (>67men, >72 women)     Objective:    Today's Vitals   03/28/23 1508  Weight: 166 lb (75.3 kg)  Height:  (1.651 m)   Body mass index is 27.62 kg/m.     03/28/2023    3:09 PM 03/19/2022   12:41 PM 07/11/2018    2:26 PM 09/18/2017    6:53 AM 09/16/2017    8:00 AM 09/08/2017   11:34 AM 07/29/2017    9:25 PM  Advanced Directives  Does Patient Have a Medical Advance Directive? Yes Yes Yes Yes Yes Yes No  Type of Estate agent of [place];Living will Healthcare Power of [place];Living will  Healthcare Power of [place];Living will Healthcare Power of [place];Living will Healthcare Power of [place];Living will   Does patient want to make changes to medical advance directive? No - Patient declined No - Patient declined   No - Patient declined    Copy of Healthcare Power of Attorney in Chart? No - copy requested No - copy requested  No - copy requested No - copy requested No - copy requested   Would patient like information on creating a medical advance directive?       No - Patient declined    Current Medications (verified) Outpatient Encounter Medications as of 03/28/2023  Medication Sig   albuterol (PROVENTIL) (2.5 MG/3ML) 0.083% nebulizer solution Inhale into the lungs.   albuterol (VENTOLIN HFA) 108 (90 Base) MCG/ACT inhaler Inhale into the lungs.   Alirocumab (PRALUENT) 75 MG/ML SOAJ Inject 1 pen (75 mg) into the skin every 14 days.   ALPRAZolam (XANAX) 0.5 MG tablet Take 1 tablet (0.5 mg total) by mouth daily as needed for anxiety.   amoxicillin (AMOXIL) 500 MG tablet Take 1 tablet (500 mg total) by mouth 2 (two) times  daily.   aspirin EC 81 MG tablet Take 1 tablet (81 mg total) by mouth daily. Swallow whole.   DULoxetine (CYMBALTA) 60 MG capsule Take 1 capsule (60 mg total) by mouth daily.   estradiol (ESTRACE) 0.1 MG/GM vaginal cream Estrogen Cream Instruction Discard applicator Apply pea sized amount to tip of finger to urethra before bed. Wash hands well after application. Use Monday, Wednesday and Friday   FLUoxetine (PROZAC) 40 MG capsule Take 1 capsule (40 mg total) by mouth daily.   hydrochlorothiazide (MICROZIDE) 12.5 MG capsule Take 1 capsule (12.5 mg total) by mouth daily.   HYDROcodone-acetaminophen (NORCO) 7.5-325 MG tablet Take 1.5 tablets by mouth as needed. Using 1-2 times daily   meloxicam (MOBIC) 15 MG tablet Take 1 tablet (15 mg total) by mouth daily.   metoprolol tartrate (LOPRESSOR) 25 MG tablet Take 1 tablet (25 mg total) by mouth daily.   pantoprazole (PROTONIX) 40 MG tablet Take 1 tablet (40 mg total) by mouth daily.   potassium chloride (KLOR-CON) 10 MEQ tablet Take 1 tablet (10 mEq total) by mouth daily.   pravastatin (PRAVACHOL) 10 MG tablet Take 1 tablet (10 mg total) by mouth daily.   predniSONE (DELTASONE) 20 MG tablet Take 1 tablet (20 mg total) by mouth daily with breakfast.   No facility-administered encounter medications on file as of 03/28/2023.  Allergies (verified) Doxycycline, Gadolinium, Gadolinium derivatives, Iodinated contrast media, Levofloxacin, Silicone, Levaquin [levofloxacin in d5w], and Adhesive [tape]   History: Past Medical History:  Diagnosis Date   Allergy    Anxiety    Arthritis    Cancer    basal cell nose   Chronic back pain    degenerative disc disease   Degenerative disc disease, lumbar    Depression    Dyspnea    with exertion / pain   Gallstones    GERD (gastroesophageal reflux disease)    Heart murmur    followed by PCP   Hx of colonic polyp    Hx: UTI (urinary tract infection)    Hypercholesterolemia    Hypertension    Panic  attacks    H/O   Wears dentures    partial upper and lower   Past Surgical History:  Procedure Laterality Date   ABDOMINAL HYSTERECTOMY  1992   partial   BACK SURGERY     lumbar fusion   CATARACT EXTRACTION Bilateral 2010,2011   CHOLECYSTECTOMY  04-19-14   COLONOSCOPY  07/02/11   DR. Byrnett   COLONOSCOPY WITH PROPOFOL N/A 09/25/2015   Procedure: COLONOSCOPY WITH PROPOFOL;  Surgeon: Midge Minium, MD;  Location: [facility];  Service: Endoscopy;  Laterality: N/A;   FOOT SURGERY Right 2009   MOUTH SURGERY     POLYPECTOMY  09/25/2015   Procedure: POLYPECTOMY;  Surgeon: Midge Minium, MD;  Location: [facility];  Service: Endoscopy;;   skin surgery Left 08/27/13   Basal cell removal-left nostril   TONSILLECTOMY AND ADENOIDECTOMY  1969   Family History  Problem Relation Age of Onset   Hyperlipidemia Mother    Hypertension Mother    Alcohol abuse Father    Hyperlipidemia Father    Heart disease Father    Diabetes Father    Hyperlipidemia Sister    Lung cancer Brother    Hyperlipidemia Brother    Breast cancer Neg Hx    Social History   Socioeconomic History   Marital status: Married    Spouse name: Not on file   Number of children: 2   Years of education: Not on file   Highest education level: Not on file  Occupational History   Not on file  Tobacco Use   Smoking status: Every Day    Packs/day: 1.00    Years: 37.00    Additional pack years: 0.00    Total pack years: 37.00    Types: Cigarettes    Start date: 08/07/2019    Last attempt to quit: 05/25/2021    Years since quitting: 1.8   Smokeless tobacco: Never   Tobacco comments:    Smokes 1/2 PPD   Vaping Use   Vaping Use: Former  Substance and Sexual Activity   Alcohol use: Yes    Alcohol/week: 1.0 standard drink of alcohol    Types: 1 Glasses of wine per week   Drug use: No   Sexual activity: Not on file  Other Topics Concern   Not on file  Social History Narrative   Not on file   Social  Determinants of Health   Financial Resource Strain: Low Risk  (03/28/2023)   Overall Financial Resource Strain (CARDIA)    Difficulty of Paying Living Expenses: Not very hard  Food Insecurity: No Food Insecurity (03/28/2023)   Hunger Vital Sign    Worried About Running Out of Food in the Last Year: Never true    Ran Out of Food  in the Last Year: Never true  Transportation Needs: No Transportation Needs (03/28/2023)   PRAPARE - Administrator, Civil Service (Medical): No    Lack of Transportation (Non-Medical): No  Physical Activity: Not on file  Stress: No Stress Concern Present (03/28/2023)   [employer] of Occupational Health - Occupational Stress Questionnaire    Feeling of Stress : Not at all  Social Connections: Moderately Integrated (03/28/2023)   Social Connection and Isolation Panel [NHANES]    Frequency of Communication with Friends and Family: More than three times a week    Frequency of Social Gatherings with Friends and Family: More than three times a week    Attends Religious Services: More than 4 times per year    Active Member of [employer] or Organizations: Not on file    Attends Banker Meetings: More than 4 times per year    Marital Status: Widowed    Tobacco Counseling Ready to quit: Not Answered Counseling given: Not Answered Tobacco comments: Smokes 1/2 PPD    Clinical Intake:  Pre-visit preparation completed: Yes        Diabetes: No  How often do you need to have someone help you when you read instructions, pamphlets, or other written materials from your doctor or pharmacy?: 1 - Never    Interpreter Needed?: No      Activities of Daily Living    03/28/2023    3:10 PM  In your present state of health, do you have any difficulty performing the following activities:  Hearing? 0  Vision? 0  Difficulty concentrating or making decisions? 0  Walking or climbing stairs? 0  Dressing or bathing? 0  Doing errands, shopping? 0   Preparing Food and eating ? N  Using the Toilet? N  In the past six months, have you accidently leaked urine? N  Do you have problems with loss of bowel control? N  Managing your Medications? N  Managing your Finances? N  Housekeeping or managing your Housekeeping? N    Patient Care Team: Dale Alderson, MD as PCP - General (Internal Medicine) Dale Trail Side, MD (Internal Medicine) Lemar Livings Merrily Pew, MD (General Surgery)  Indicate any recent Medical Services you may have received from other than [facility] providers in the past year (date may be approximate).     Assessment:   This is a routine wellness examination for Zamiya.  I connected with  James A Dockstader on 03/28/23 by a audio enabled telemedicine application and verified that I am speaking with the correct person using two identifiers.  Patient Location: Home  Provider Location: Office/Clinic  I discussed the limitations of evaluation and management by telemedicine. The patient expressed understanding and agreed to proceed.   Hearing/Vision screen Hearing Screening - Comments:: Patient is able to hear conversational tones without difficulty.  No issues reported.   Vision Screening - Comments:: Followed by Dr. Larence Penning Wears corrective lenses Cataract extraction, bilateral They have seen their ophthalmologist in the last 12 months.    Dietary issues and exercise activities discussed: Current Exercise Habits: Home exercise routine, Intensity: Mild Regular diet   Goals Addressed             This Visit's Progress    Maintain Healthy Lifestyle   On track    Stay active Healthy diet       Depression Screen    03/28/2023    3:10 PM 12/27/2022    8:37 AM 09/21/2022    2:06 PM 07/20/2022  1:36 PM 06/03/2022    2:03 PM 04/06/2022    9:26 AM 03/19/2022   12:37 PM  PHQ 2/9 Scores  PHQ - 2 Score 0 0 3 1 1 1  0  PHQ- 9 Score  2 8  2       Fall Risk    03/28/2023    3:10 PM 12/27/2022    8:37 AM 09/21/2022     2:06 PM 07/20/2022    1:36 PM 06/03/2022    2:03 PM  Fall Risk   Falls in the past year? 1 0 0 0 0  Number falls in past yr: 0 0 0 0 0  Injury with Fall? 0 0 0 0 0  Risk for fall due to :  No Fall Risks No Fall Risks No Fall Risks No Fall Risks  Follow up Falls evaluation completed;Falls prevention discussed Falls evaluation completed Falls evaluation completed Falls evaluation completed Falls evaluation completed    FALL RISK PREVENTION PERTAINING TO THE HOME: Home free of loose throw rugs in walkways, pet beds, electrical cords, etc? Yes  Adequate lighting in your home to reduce risk of falls? Yes   ASSISTIVE DEVICES UTILIZED TO PREVENT FALLS: Life alert? No  Use of a cane, walker or w/c? No  Grab bars in the bathroom? Yes  Shower chair or bench in shower? Yes  Elevated toilet seat or a handicapped toilet? Yes   TIMED UP AND GO: Was the test performed? No .    Cognitive Function:        03/28/2023    3:32 PM  6CIT Screen  What Year? 0 points  What month? 0 points  What time? 0 points  Count back from 20 0 points  Months in reverse 0 points  Repeat phrase 0 points  Total Score 0 points    Immunizations Immunization History  Administered Date(s) Administered   Fluad Quad(high Dose 65+) 08/11/2021, 09/21/2022   Influenza,inj,Quad PF,6+ Mos 09/05/2013, 08/05/2014, 08/20/2015, 08/23/2019   Influenza-Unspecified 09/11/2016, 09/03/2017, 08/21/2018, 11/07/2020   Tdap 06/15/2018   Pneumococcal vaccine status: Due, Education has been provided regarding the importance of this vaccine. Advised may receive this vaccine at local pharmacy or Health Dept. Aware to provide a copy of the vaccination record if obtained from local pharmacy or Health Dept. Verbalized acceptance and understanding.  Screening Tests Health Maintenance  Topic Date Due   Pneumonia Vaccine 81+ Years old (1 of 2 - PCV) Never done   COLONOSCOPY (Pts 45-47yrs Insurance coverage will need to be confirmed)   09/24/2020   Lung Cancer Screening  03/30/2023   Zoster Vaccines- Shingrix (1 of 2) 03/28/2023 (Originally 02/04/2006)   MAMMOGRAM  03/31/2023   INFLUENZA VACCINE  07/07/2023   Medicare Annual Wellness (AWV)  03/27/2024   DTaP/Tdap/Td (2 - Td or Tdap) 06/15/2028   DEXA SCAN  Completed   Hepatitis C Screening  Completed   HPV VACCINES  Aged Out   COVID-19 Vaccine  Discontinued    Health Maintenance Health Maintenance Due  Topic Date Due   Pneumonia Vaccine 23+ Years old (1 of 2 - PCV) Never done   COLONOSCOPY (Pts 45-35yrs Insurance coverage will need to be confirmed)  09/24/2020   Lung Cancer Screening  03/30/2023   Colonoscopy- deferred per patient preference.   Mammogram- ordered. Declined number for scheduling, notes she has it in her phone. Number for scheduling [phone].   Lung Cancer Screening: scheduled.   Hepatitis C Screening: Completed 10/2018.  Vision Screening: Recommended annual ophthalmology exams  for early detection of glaucoma and other disorders of the eye.  Dental Screening: Recommended annual dental exams for proper oral hygiene  Community Resource Referral / Chronic Care Management: CRR required this visit?  No   CCM required this visit?  No      Plan:     I have personally reviewed and noted the following in the patient's chart:   Medical and social history Use of alcohol, tobacco or illicit drugs  Current medications and supplements including opioid prescriptions. Patient is currently taking opioid prescriptions. Information provided to patient regarding non-opioid alternatives. Patient advised to discuss non-opioid treatment plan with their provider. Functional ability and status Nutritional status Physical activity Advanced directives List of other physicians Hospitalizations, surgeries, and ER visits in previous 12 months Vitals Screenings to include cognitive, depression, and falls Referrals and appointments  In addition, I have  reviewed and discussed with patient certain preventive protocols, quality metrics, and best practice recommendations. A written personalized care plan for preventive services as well as general preventive health recommendations were provided to patient.     Cathey Endow, LPN   [id number]

## 2023-03-28 NOTE — Patient Instructions (Addendum)
Angel Riddle , Thank you for taking time to come for your Medicare Wellness Visit. I appreciate your ongoing commitment to your health goals. Please review the following plan we discussed and let me know if I can assist you in the future.   These are the goals we discussed:  Goals      Maintain Healthy Lifestyle     Stay active Healthy diet        This is a list of the screening recommended for you and due dates:  Health Maintenance  Topic Date Due   Pneumonia Vaccine (1 of 2 - PCV) Never done   Colon Cancer Screening  09/24/2020   Screening for Lung Cancer  03/30/2023   Zoster (Shingles) Vaccine (1 of 2) 03/28/2023*   Mammogram  03/31/2023   Flu Shot  07/07/2023   Medicare Annual Wellness Visit  03/27/2024   DTaP/Tdap/Td vaccine (2 - Td or Tdap) 06/15/2028   DEXA scan (bone density measurement)  Completed   Hepatitis C Screening: USPSTF Recommendation to screen - Ages 70-79 yo.  Completed   HPV Vaccine  Aged Out   COVID-19 Vaccine  Discontinued  *Topic was postponed. The date shown is not the original due date.    Advanced directives: End of life planning; Advance aging; Advanced directives discussed.  Copy of current HCPOA/Living Will requested.    Conditions/risks identified: none new.   Next appointment: Follow up in one year for your annual wellness visit    Preventive Care 65 Years and Older, Female Preventive care refers to lifestyle choices and visits with your health care provider that can promote health and wellness. What does preventive care include? A yearly physical exam. This is also called an annual well check. Dental exams once or twice a year. Routine eye exams. Ask your health care provider how often you should have your eyes checked. Personal lifestyle choices, including: Daily care of your teeth and gums. Regular physical activity. Eating a healthy diet. Avoiding tobacco and drug use. Limiting alcohol use. Practicing safe sex. Taking low-dose  aspirin every day. Taking vitamin and mineral supplements as recommended by your health care provider. What happens during an annual well check? The services and screenings done by your health care provider during your annual well check will depend on your age, overall health, lifestyle risk factors, and family history of disease. Counseling  Your health care provider may ask you questions about your: Alcohol use. Tobacco use. Drug use. Emotional well-being. Home and relationship well-being. Sexual activity. Eating habits. History of falls. Memory and ability to understand (cognition). Work and work Astronomer. Reproductive health. Screening  You may have the following tests or measurements: Height, weight, and BMI. Blood pressure. Lipid and cholesterol levels. These may be checked every 5 years, or more frequently if you are over 72 years old. Skin check. Lung cancer screening. You may have this screening every year starting at age 41 if you have a 30-pack-year history of smoking and currently smoke or have quit within the past 15 years. Fecal occult blood test (FOBT) of the stool. You may have this test every year starting at age 69. Flexible sigmoidoscopy or colonoscopy. You may have a sigmoidoscopy every 5 years or a colonoscopy every 10 years starting at age 48. Hepatitis C blood test. Hepatitis B blood test. Sexually transmitted disease (STD) testing. Diabetes screening. This is done by checking your blood sugar (glucose) after you have not eaten for a while (fasting). You may have this done every  1-3 years. Bone density scan. This is done to screen for osteoporosis. You may have this done starting at age 74. Mammogram. This may be done every 1-2 years. Talk to your health care provider about how often you should have regular mammograms. Talk with your health care provider about your test results, treatment options, and if necessary, the need for more tests. Vaccines  Your  health care provider may recommend certain vaccines, such as: Influenza vaccine. This is recommended every year. Tetanus, diphtheria, and acellular pertussis (Tdap, Td) vaccine. You may need a Td booster every 10 years. Zoster vaccine. You may need this after age 33. Pneumococcal 13-valent conjugate (PCV13) vaccine. One dose is recommended after age 80. Pneumococcal polysaccharide (PPSV23) vaccine. One dose is recommended after age 12. Talk to your health care provider about which screenings and vaccines you need and how often you need them. This information is not intended to replace advice given to you by your health care provider. Make sure you discuss any questions you have with your health care provider. Document Released: 12/19/2015 Document Revised: 08/11/2016 Document Reviewed: 09/23/2015 Elsevier Interactive Patient Education  2017 [place] Prevention in the Home Falls can cause injuries. They can happen to people of all ages. There are many things you can do to make your home safe and to help prevent falls. What can I do on the outside of my home? Regularly fix the edges of walkways and driveways and fix any cracks. Remove anything that might make you trip as you walk through a door, such as a raised step or threshold. Trim any bushes or trees on the path to your home. Use bright outdoor lighting. Clear any walking paths of anything that might make someone trip, such as rocks or tools. Regularly check to see if handrails are loose or broken. Make sure that both sides of any steps have handrails. Any raised decks and porches should have guardrails on the edges. Have any leaves, snow, or ice cleared regularly. Use sand or salt on walking paths during winter. Clean up any spills in your garage right away. This includes oil or grease spills. What can I do in the bathroom? Use night lights. Install grab bars by the toilet and in the tub and shower. Do not use towel bars as  grab bars. Use non-skid mats or decals in the tub or shower. If you need to sit down in the shower, use a plastic, non-slip stool. Keep the floor dry. Clean up any water that spills on the floor as soon as it happens. Remove soap buildup in the tub or shower regularly. Attach bath mats securely with double-sided non-slip rug tape. Do not have throw rugs and other things on the floor that can make you trip. What can I do in the bedroom? Use night lights. Make sure that you have a light by your bed that is easy to reach. Do not use any sheets or blankets that are too big for your bed. They should not hang down onto the floor. Have a firm chair that has side arms. You can use this for support while you get dressed. Do not have throw rugs and other things on the floor that can make you trip. What can I do in the kitchen? Clean up any spills right away. Avoid walking on wet floors. Keep items that you use a lot in easy-to-reach places. If you need to reach something above you, use a strong step stool that has a  grab bar. Keep electrical cords out of the way. Do not use floor polish or wax that makes floors slippery. If you must use wax, use non-skid floor wax. Do not have throw rugs and other things on the floor that can make you trip. What can I do with my stairs? Do not leave any items on the stairs. Make sure that there are handrails on both sides of the stairs and use them. Fix handrails that are broken or loose. Make sure that handrails are as long as the stairways. Check any carpeting to make sure that it is firmly attached to the stairs. Fix any carpet that is loose or worn. Avoid having throw rugs at the top or bottom of the stairs. If you do have throw rugs, attach them to the floor with carpet tape. Make sure that you have a light switch at the top of the stairs and the bottom of the stairs. If you do not have them, ask someone to add them for you. What else can I do to help prevent  falls? Wear shoes that: Do not have high heels. Have rubber bottoms. Are comfortable and fit you well. Are closed at the toe. Do not wear sandals. If you use a stepladder: Make sure that it is fully opened. Do not climb a closed stepladder. Make sure that both sides of the stepladder are locked into place. Ask someone to hold it for you, if possible. Clearly mark and make sure that you can see: Any grab bars or handrails. First and last steps. Where the edge of each step is. Use tools that help you move around (mobility aids) if they are needed. These include: Canes. Walkers. Scooters. Crutches. Turn on the lights when you go into a dark area. Replace any light bulbs as soon as they burn out. Set up your furniture so you have a clear path. Avoid moving your furniture around. If any of your floors are uneven, fix them. If there are any pets around you, be aware of where they are. Review your medicines with your doctor. Some medicines can make you feel dizzy. This can increase your chance of falling. Ask your doctor what other things that you can do to help prevent falls. This information is not intended to replace advice given to you by your health care provider. Make sure you discuss any questions you have with your health care provider. Document Released: 09/18/2009 Document Revised: 04/29/2016 Document Reviewed: 12/27/2014 Elsevier Interactive Patient Education  2017 [employer] Opioid Pain Medicine Management Opioids are powerful medicines that are used to treat moderate to severe pain. When used for short periods of time, they can help you to: Sleep better. Do better in physical or occupational therapy. Feel better in the first few days after an injury. Recover from surgery. Opioids should be taken with the supervision of a trained health care provider. They should be taken for the shortest period of time possible. This is because opioids can be addictive, and the longer you  take opioids, the greater your risk of addiction. This addiction can also be called opioid use disorder. What are the risks? Using opioid pain medicines for longer than 3 days increases your risk of side effects. Side effects include: Constipation. Nausea and vomiting. Breathing difficulties (respiratory depression). Drowsiness. Confusion. Opioid use disorder. Itching. Taking opioid pain medicine for a long period of time can affect your ability to do daily tasks. It also puts you at risk for: Motor vehicle crashes. Depression. Suicide.  Heart attack. Overdose, which can be life-threatening. What is a pain treatment plan? A pain treatment plan is an agreement between you and your health care provider. Pain is unique to each person, and treatments vary depending on your condition. To manage your pain, you and your health care provider need to work together. To help you do this: Discuss the goals of your treatment, including how much pain you might expect to have and how you will manage the pain. Review the risks and benefits of taking opioid medicines. Remember that a good treatment plan uses more than one approach and minimizes the chance of side effects. Be honest about the amount of medicines you take and about any drug or alcohol use. Get pain medicine prescriptions from only one health care provider. Pain can be managed with many types of alternative treatments. Ask your health care provider to refer you to one or more specialists who can help you manage pain through: Physical or occupational therapy. Counseling (cognitive behavioral therapy). Good nutrition. Biofeedback. Massage. Meditation. Non-opioid medicine. Following a gentle exercise program. How to use opioid pain medicine Taking medicine Take your pain medicine exactly as told by your health care provider. Take it only when you need it. If your pain gets less severe, you may take less than your prescribed dose if your  health care provider approves. If you are not having pain, do nottake pain medicine unless your health care provider tells you to take it. If your pain is severe, do nottry to treat it yourself by taking more pills than instructed on your prescription. Contact your health care provider for help. Write down the times when you take your pain medicine. It is easy to become confused while on pain medicine. Writing the time can help you avoid overdose. Take other over-the-counter or prescription medicines only as told by your health care provider. Keeping yourself and others safe  While you are taking opioid pain medicine: Do not drive, use machinery, or power tools. Do not sign legal documents. Do not drink alcohol. Do not take sleeping pills. Do not supervise children by yourself. Do not do activities that require climbing or being in high places. Do not go to a lake, river, ocean, spa, or swimming pool. Do not share your pain medicine with anyone. Keep pain medicine in a locked cabinet or in a secure area where pets and children cannot reach it. Stopping your use of opioids If you have been taking opioid medicine for more than a few weeks, you may need to slowly decrease (taper) how much you take until you stop completely. Tapering your use of opioids can decrease your risk of symptoms of withdrawal, such as: Pain and cramping in the abdomen. Nausea. Sweating. Sleepiness. Restlessness. Uncontrollable shaking (tremors). Cravings for the medicine. Do not attempt to taper your use of opioids on your own. Talk with your health care provider about how to do this. Your health care provider may prescribe a step-down schedule based on how much medicine you are taking and how long you have been taking it. Getting rid of leftover pills Do not save any leftover pills. Get rid of leftover pills safely by: Taking the medicine to a prescription take-back program. This is usually offered by the county or  law enforcement. Bringing them to a pharmacy that has a drug disposal container. Flushing them down the toilet. Check the label or package insert of your medicine to see whether this is safe to do. Throwing them out in  the trash. Check the label or package insert of your medicine to see whether this is safe to do. If it is safe to throw it out, remove the medicine from the original container, put it into a sealable bag or container, and mix it with used coffee grounds, food scraps, dirt, or cat litter before putting it in the trash. Follow these instructions at home: Activity Do exercises as told by your health care provider. Avoid activities that make your pain worse. Return to your normal activities as told by your health care provider. Ask your health care provider what activities are safe for you. General instructions You may need to take these actions to prevent or treat constipation: Drink enough fluid to keep your urine pale yellow. Take over-the-counter or prescription medicines. Eat foods that are high in fiber, such as beans, whole grains, and fresh fruits and vegetables. Limit foods that are high in fat and processed sugars, such as fried or sweet foods. Keep all follow-up visits. This is important. Where to find support If you have been taking opioids for a long time, you may benefit from receiving support for quitting from a local support group or counselor. Ask your health care provider for a referral to these resources in your area. Where to find more information Centers for Disease Control and Prevention (CDC): http://www.wolf.info/ [place] Food and Drug Administration (FDA): [place] Get help right away if: You may have taken too much of an opioid (overdosed). Common symptoms of an overdose: Your breathing is slower or more shallow than normal. You have a very slow heartbeat (pulse). You have slurred speech. You have nausea and vomiting. Your pupils become very small. You have  other potential symptoms: You are very confused. You faint or feel like you will faint. You have cold, clammy skin. You have blue lips or fingernails. You have thoughts of harming yourself or harming others. These symptoms may represent a serious problem that is an emergency. Do not wait to see if the symptoms will go away. Get medical help right away. Call your local emergency services (911 in the [place] Do not drive yourself to the hospital.  If you ever feel like you may hurt yourself or others, or have thoughts about taking your own life, get help right away. Go to your nearest emergency department or: Call your local emergency services (911 in the [place] Call the [facility] 434-541-2378 in the [place] Call a suicide crisis helpline, such as the [place] at 8021379383 or 988 in the [place]. This is open 24 hours a day in the [place] Text the Crisis Text Line at (281)881-2779 (in the [place].). Summary Opioid medicines can help you manage moderate to severe pain for a short period of time. A pain treatment plan is an agreement between you and your health care provider. Discuss the goals of your treatment, including how much pain you might expect to have and how you will manage the pain. If you think that you or someone else may have taken too much of an opioid, get medical help right away. This information is not intended to replace advice given to you by your health care provider. Make sure you discuss any questions you have with your health care provider. Document Revised: 06/17/2021 Document Reviewed: 03/04/2021 Elsevier Patient Education  [employer].

## 2023-03-29 ENCOUNTER — Ambulatory Visit (INDEPENDENT_AMBULATORY_CARE_PROVIDER_SITE_OTHER): Payer: Medicare Other | Admitting: Internal Medicine

## 2023-03-29 VITALS — BP 132/74 | HR 86 | Temp 99.2°F | Resp 16 | Ht 65.0 in | Wt 166.0 lb

## 2023-03-29 DIAGNOSIS — K219 Gastro-esophageal reflux disease without esophagitis: Secondary | ICD-10-CM

## 2023-03-29 DIAGNOSIS — R051 Acute cough: Secondary | ICD-10-CM

## 2023-03-29 DIAGNOSIS — Z87891 Personal history of nicotine dependence: Secondary | ICD-10-CM

## 2023-03-29 DIAGNOSIS — Z860100 Personal history of colon polyps, unspecified: Secondary | ICD-10-CM

## 2023-03-29 DIAGNOSIS — I10 Essential (primary) hypertension: Secondary | ICD-10-CM | POA: Diagnosis not present

## 2023-03-29 DIAGNOSIS — F419 Anxiety disorder, unspecified: Secondary | ICD-10-CM | POA: Diagnosis not present

## 2023-03-29 DIAGNOSIS — G8929 Other chronic pain: Secondary | ICD-10-CM

## 2023-03-29 DIAGNOSIS — M791 Myalgia, unspecified site: Secondary | ICD-10-CM

## 2023-03-29 DIAGNOSIS — I700 Atherosclerosis of aorta: Secondary | ICD-10-CM | POA: Diagnosis not present

## 2023-03-29 DIAGNOSIS — M48062 Spinal stenosis, lumbar region with neurogenic claudication: Secondary | ICD-10-CM

## 2023-03-29 DIAGNOSIS — R739 Hyperglycemia, unspecified: Secondary | ICD-10-CM

## 2023-03-29 DIAGNOSIS — E7800 Pure hypercholesterolemia, unspecified: Secondary | ICD-10-CM | POA: Diagnosis not present

## 2023-03-29 DIAGNOSIS — F320 Major depressive disorder, single episode, mild: Secondary | ICD-10-CM | POA: Diagnosis not present

## 2023-03-29 DIAGNOSIS — M79646 Pain in unspecified finger(s): Secondary | ICD-10-CM

## 2023-03-29 MED ORDER — ALBUTEROL SULFATE HFA 108 (90 BASE) MCG/ACT IN AERS: 2.0000 | INHALATION_SPRAY | Freq: Four times a day (QID) | RESPIRATORY_TRACT | 1 refills | Status: DC | PRN

## 2023-03-29 MED ORDER — CEFDINIR 300 MG PO CAPS: 300.0000 mg | ORAL_CAPSULE | Freq: Two times a day (BID) | ORAL | 0 refills | Status: DC

## 2023-03-29 MED ORDER — PREDNISONE 10 MG PO TABS
ORAL_TABLET | ORAL | 0 refills | Status: DC
Start: 2023-03-29 — End: 2023-05-04

## 2023-03-29 NOTE — Progress Notes (Signed)
Subjective:    Patient ID: Angel Riddle, female    DOB: 02/06/1956, 67 y.o.   MRN: [id number]  Patient here for  Chief Complaint  Patient presents with   Medical Management of Chronic Issues    HPI Here for follow up  regarding hypercholesterolemia and hypertension.  Has had persistent back/leg issues. Moves - grabs.  Seeing Dr Lovell Sheehan. Ordered - MRI.  F/u 04/2023.  Also seeing ortho - bilateral thumb pain - [facility] arthritis. Placed in thumb spica splints. S/p injections. Also evaluated for left shoulder pain. S/p injection. Plan f/u with Dr Joice Lofts - thumb.  Reports increased cough and congestion.  Has noticed some increased drainage.  Congestion - productive of yellow mucus.  No sore throat.  Some wheezing.  No chest pain.  No increased sob.  No vomiting or diarrhea reported.      Past Medical History:  Diagnosis Date   Allergy    Anxiety    Arthritis    Cancer (HCC)    basal cell nose   Chronic back pain    degenerative disc disease   Degenerative disc disease, lumbar    Depression    Dyspnea    with exertion / pain   Gallstones    GERD (gastroesophageal reflux disease)    Heart murmur    followed by PCP   Hx of colonic polyp    Hx: UTI (urinary tract infection)    Hypercholesterolemia    Hypertension    Panic attacks    H/O   Wears dentures    partial upper and lower   Past Surgical History:  Procedure Laterality Date   ABDOMINAL HYSTERECTOMY  1992   partial   BACK SURGERY     lumbar fusion   CATARACT EXTRACTION Bilateral 2010,2011   CHOLECYSTECTOMY  04-19-14   COLONOSCOPY  07/02/11   DR. Byrnett   COLONOSCOPY WITH PROPOFOL N/A 09/25/2015   Procedure: COLONOSCOPY WITH PROPOFOL;  Surgeon: Midge Minium, MD;  Location: [facility];  Service: Endoscopy;  Laterality: N/A;   FOOT SURGERY Right 2009   MOUTH SURGERY     POLYPECTOMY  09/25/2015   Procedure: POLYPECTOMY;  Surgeon: Midge Minium, MD;  Location: [facility];  Service: Endoscopy;;   skin  surgery Left 08/27/13   Basal cell removal-left nostril   TONSILLECTOMY AND ADENOIDECTOMY  1969   Family History  Problem Relation Age of Onset   Hyperlipidemia Mother    Hypertension Mother    Alcohol abuse Father    Hyperlipidemia Father    Heart disease Father    Diabetes Father    Hyperlipidemia Sister    Lung cancer Brother    Hyperlipidemia Brother    Breast cancer Neg Hx    Social History   Socioeconomic History   Marital status: Married    Spouse name: Not on file   Number of children: 2   Years of education: Not on file   Highest education level: Not on file  Occupational History   Not on file  Tobacco Use   Smoking status: Every Day    Packs/day: 1.00    Years: 37.00    Additional pack years: 0.00    Total pack years: 37.00    Types: Cigarettes    Start date: 08/07/2019    Last attempt to quit: 05/25/2021    Years since quitting: 1.8   Smokeless tobacco: Never   Tobacco comments:    Smokes 1/2 PPD   Vaping Use  Vaping Use: Former  Substance and Sexual Activity   Alcohol use: Yes    Alcohol/week: 1.0 standard drink of alcohol    Types: 1 Glasses of wine per week   Drug use: No   Sexual activity: Not on file  Other Topics Concern   Not on file  Social History Narrative   Not on file   Social Determinants of Health   Financial Resource Strain: Low Risk  (03/28/2023)   Overall Financial Resource Strain (CARDIA)    Difficulty of Paying Living Expenses: Not very hard  Food Insecurity: No Food Insecurity (03/28/2023)   Hunger Vital Sign    Worried About Running Out of Food in the Last Year: Never true    Ran Out of Food in the Last Year: Never true  Transportation Needs: No Transportation Needs (03/28/2023)   PRAPARE - Administrator, Civil Service (Medical): No    Lack of Transportation (Non-Medical): No  Physical Activity: Not on file  Stress: No Stress Concern Present (03/28/2023)   [employer] of Occupational Health - Occupational  Stress Questionnaire    Feeling of Stress : Not at all  Social Connections: Moderately Integrated (03/28/2023)   Social Connection and Isolation Panel [NHANES]    Frequency of Communication with Friends and Family: More than three times a week    Frequency of Social Gatherings with Friends and Family: More than three times a week    Attends Religious Services: More than 4 times per year    Active Member of [employer] or Organizations: Not on file    Attends Banker Meetings: More than 4 times per year    Marital Status: Widowed     Review of Systems  Constitutional:  Negative for appetite change and unexpected weight change.  HENT:  Positive for congestion and postnasal drip. Negative for sore throat.   Respiratory:  Positive for cough and wheezing. Negative for chest tightness and shortness of breath.   Cardiovascular:  Negative for chest pain and palpitations.  Gastrointestinal:  Negative for abdominal pain, nausea and vomiting.  Genitourinary:  Negative for difficulty urinating and dysuria.  Musculoskeletal:        Chronic back /leg pain as outlined.  Thumb and shoulder pain as outlined.   Skin:  Negative for color change and rash.  Neurological:  Negative for dizziness and headaches.  Psychiatric/Behavioral:  Negative for agitation and dysphoric mood.        Objective:     BP 132/74   Pulse 86   Temp 99.2 F (37.3 C)   Resp 16   Ht 5\' 5"  (1.651 m)   Wt 166 lb (75.3 kg)   SpO2 98%   BMI 27.62 kg/m  Wt Readings from Last 3 Encounters:  03/29/23 166 lb (75.3 kg)  03/28/23 166 lb (75.3 kg)  12/31/22 166 lb (75.3 kg)    Physical Exam Vitals reviewed.  Constitutional:      General: She is not in acute distress.    Appearance: Normal appearance.  HENT:     Head: Normocephalic and atraumatic.     Right Ear: External ear normal.     Left Ear: External ear normal.  Eyes:     General: No scleral icterus.       Right eye: No discharge.        Left eye: No  discharge.     Conjunctiva/sclera: Conjunctivae normal.  Neck:     Thyroid: No thyromegaly.  Cardiovascular:  Rate and Rhythm: Normal rate and regular rhythm.  Pulmonary:     Effort: No respiratory distress.     Breath sounds: Normal breath sounds.     Comments: Wheezing noted on exam.   Abdominal:     General: Bowel sounds are normal.     Palpations: Abdomen is soft.     Tenderness: There is no abdominal tenderness.  Musculoskeletal:        General: No swelling or tenderness.     Cervical back: Neck supple. No tenderness.  Lymphadenopathy:     Cervical: No cervical adenopathy.  Skin:    Findings: No erythema or rash.  Neurological:     Mental Status: She is alert.  Psychiatric:        Mood and Affect: Mood normal.        Behavior: Behavior normal.      Outpatient Encounter Medications as of 03/29/2023  Medication Sig   cefdinir (OMNICEF) 300 MG capsule Take 1 capsule (300 mg total) by mouth 2 (two) times daily.   predniSONE (DELTASONE) 10 MG tablet Take 6 tablets x 1 day and then decrease by 1/2 tablet per day until down to zero mg.   albuterol (PROVENTIL) (2.5 MG/3ML) 0.083% nebulizer solution Inhale into the lungs.   albuterol (VENTOLIN HFA) 108 (90 Base) MCG/ACT inhaler Inhale 2 puffs into the lungs every 6 (six) hours as needed for wheezing or shortness of breath.   Alirocumab (PRALUENT) 75 MG/ML SOAJ Inject 1 pen (75 mg) into the skin every 14 days.   ALPRAZolam (XANAX) 0.5 MG tablet Take 1 tablet (0.5 mg total) by mouth daily as needed for anxiety.   aspirin EC 81 MG tablet Take 1 tablet (81 mg total) by mouth daily. Swallow whole.   DULoxetine (CYMBALTA) 60 MG capsule Take 1 capsule (60 mg total) by mouth daily.   estradiol (ESTRACE) 0.1 MG/GM vaginal cream Estrogen Cream Instruction Discard applicator Apply pea sized amount to tip of finger to urethra before bed. Wash hands well after application. Use Monday, Wednesday and Friday   FLUoxetine (PROZAC) 40 MG capsule  Take 1 capsule (40 mg total) by mouth daily.   hydrochlorothiazide (MICROZIDE) 12.5 MG capsule Take 1 capsule (12.5 mg total) by mouth daily.   HYDROcodone-acetaminophen (NORCO) 7.5-325 MG tablet Take 1.5 tablets by mouth as needed. Using 1-2 times daily   meloxicam (MOBIC) 15 MG tablet Take 1 tablet (15 mg total) by mouth daily.   metoprolol tartrate (LOPRESSOR) 25 MG tablet Take 1 tablet (25 mg total) by mouth daily.   pantoprazole (PROTONIX) 40 MG tablet Take 1 tablet (40 mg total) by mouth daily.   potassium chloride (KLOR-CON) 10 MEQ tablet Take 1 tablet (10 mEq total) by mouth daily.   [DISCONTINUED] albuterol (VENTOLIN HFA) 108 (90 Base) MCG/ACT inhaler Inhale into the lungs.   [DISCONTINUED] amoxicillin (AMOXIL) 500 MG tablet Take 1 tablet (500 mg total) by mouth 2 (two) times daily.   [DISCONTINUED] pravastatin (PRAVACHOL) 10 MG tablet Take 1 tablet (10 mg total) by mouth daily.   [DISCONTINUED] predniSONE (DELTASONE) 20 MG tablet Take 1 tablet (20 mg total) by mouth daily with breakfast.   No facility-administered encounter medications on file as of 03/29/2023.     Lab Results  Component Value Date   WBC 8.5 03/29/2023   HGB 14.4 03/29/2023   HCT 41.7 03/29/2023   PLT 348.0 03/29/2023   GLUCOSE 101 (H) 03/25/2023   CHOL 233 (H) 03/25/2023   TRIG 296.0 (H) 03/25/2023  HDL 48.60 03/25/2023   LDLDIRECT 150.0 03/25/2023   LDLCALC 97 04/05/2022   ALT 8 03/25/2023   AST 19 03/25/2023   NA 138 03/25/2023   K 3.8 03/25/2023   CL 101 03/25/2023   CREATININE 0.88 03/25/2023   BUN 17 03/25/2023   CO2 27 03/25/2023   TSH 1.93 12/23/2022   HGBA1C 6.1 03/25/2023    MR LUMBAR SPINE WO CONTRAST  Result Date: 03/10/2023 CLINICAL DATA:  Chronic low back, hip, and bilateral leg pain. History of prior fusion. EXAM: MRI LUMBAR SPINE WITHOUT CONTRAST TECHNIQUE: Multiplanar, multisequence MR imaging of the lumbar spine was performed. No intravenous contrast was administered. COMPARISON:   Lumbar spine x-rays dated March 01, 2023. MRI lumbar spine dated May 10, 2020. FINDINGS: Segmentation:  Standard. Alignment: Unchanged mild anterolisthesis at L1-L2 and mild retrolisthesis at L2-L3. Vertebrae: No fracture, evidence of discitis, or bone lesion. Prior L3-L5 PLIF. Conus medullaris and cauda equina: Conus extends to the L1-L2 level. Conus and cauda equina appear normal. Paraspinal and other soft tissues: Negative. Disc levels: T12-L1: Negative disc. Unchanged mild bilateral facet arthropathy. No stenosis. L1-L2: Progressive mild disc bulging with new superimposed small up turning central disc protrusion. Unchanged moderate bilateral facet arthropathy. New mild to moderate spinal canal stenosis. No neuroforaminal stenosis. L2-L3: Slightly progressive mild disc bulging and mild-to-moderate bilateral facet arthropathy. New mild left lateral recess stenosis. No spinal canal or neuroforaminal stenosis. L3-L4: Prior posterior decompression and PLIF. No residual stenosis. L4-L5: Prior posterior decompression and PLIF. No residual stenosis. L5-S1: New small broad-based posterior disc protrusion with progressive moderate to severe bilateral facet arthropathy. Progressive mild-to-moderate bilateral lateral recess stenosis and moderate bilateral neuroforaminal stenosis. No spinal canal stenosis. IMPRESSION: 1. Prior L3-L5 PLIF with progressive adjacent segment disease at L1-L2 and L5-S1. New mild-to-moderate spinal canal stenosis at L1-L2. 2. Progressive mild-to-moderate bilateral lateral recess stenosis and moderate bilateral neuroforaminal stenosis at L5-S1. Electronically Signed   By: Obie Dredge M.D.   On: 03/10/2023 08:49       Assessment & Plan:  Muscle ache Assessment & Plan: With muscle aches as outlined.  Has chronic back issues.  Check ck and esr.  Follow.  Keep f/u with [facility] and ortho.   Orders: -     CK -     Sedimentation rate -     CBC with Differential/Platelet  Acute  cough Assessment & Plan: Increased congestion and cough as outlined.  No increased sob.  Some wheezing noted on exam.  Will treat with omnicef as directed.  Prednisone taper.  Albuterol inhaler if needed.  Saline nasal spray/steroid nasal spray.  Follow.  Call with update.     Anxiety Assessment & Plan: On cymbalta and prozac.   Follow closely.  Previously tried buspar and gabapentin - did not feel helped.  Has xanax .5mg  to use sparingly.  F/u with psych.    Aortic atherosclerosis (HCC) Assessment & Plan: Continue praluent.    Chronic midline back pain, unspecified back location Assessment & Plan: S/p previous surgery. Persistent pain.  Seeing [facility] - Dr Lovell Sheehan.  Had MRI.  F/u 04/2023.    Depression, major, single episode, mild (HCC) Assessment & Plan: Continues on prozac and cymbalta.  She is limiting xanax usage. Psychiatry.    Gastroesophageal reflux disease, unspecified whether esophagitis present Assessment & Plan: No upper symptoms reported.  Continue prilosec.     History of colonic polyps Assessment & Plan: Saw Dr Lemar Livings.  Recommended cologuard. Recommended holding colonoscopy until 2025-2026.  Hypercholesterolemia Assessment & Plan: Low cholesterol diet and exercise.  Continue praluent.  Follow lipid panel and liver function tests.  Lab Results  Component Value Date   CHOL 233 (H) 03/25/2023   HDL 48.60 03/25/2023   LDLCALC 97 04/05/2022   LDLDIRECT 150.0 03/25/2023   TRIG 296.0 (H) 03/25/2023   CHOLHDL 5 03/25/2023    Orders: -     Lipid panel; Future -     Hepatic function panel; Future  Hyperglycemia Assessment & Plan: Low carb diet and exercise.  Follow met b and a1c.  Lab Results  Component Value Date   HGBA1C 6.1 03/25/2023    Orders: -     Hemoglobin A1c; Future  Primary hypertension Assessment & Plan: Continue hctz and metoprolol.  Recheck wnl. Follow pressures.  Follow metabolic panel.   Orders: -     Basic metabolic panel;  Future  Lumbar stenosis with neurogenic claudication Assessment & Plan: Seeing Dr Lovell Sheehan.  Evaluated 02/2023 - MRI as outlined.  F/u 04/2023.    Personal history of tobacco use, presenting hazards to health Assessment & Plan: Planning for lung screening - chest CT.    Thumb pain, unspecified laterality Assessment & Plan: Seeing ortho.  F/u - Dr Joice Lofts.    Other orders -     Albuterol Sulfate HFA; Inhale 2 puffs into the lungs every 6 (six) hours as needed for wheezing or shortness of breath.  Dispense: 18 g; Refill: 1 -     Cefdinir; Take 1 capsule (300 mg total) by mouth 2 (two) times daily.  Dispense: 20 capsule; Refill: 0 -     predniSONE; Take 6 tablets x 1 day and then decrease by 1/2 tablet per day until down to zero mg.  Dispense: 39 tablet; Refill: 0     Dale Silverdale, MD

## 2023-03-30 LAB — CBC WITH DIFFERENTIAL/PLATELET
Basophils Absolute: 0.1 10*3/uL (ref 0.0–0.1)
Basophils Relative: 0.9 % (ref 0.0–3.0)
Eosinophils Absolute: 0.1 10*3/uL (ref 0.0–0.7)
Eosinophils Relative: 1.2 % (ref 0.0–5.0)
HCT: 41.7 % (ref 36.0–46.0)
Hemoglobin: 14.4 g/dL (ref 12.0–15.0)
Lymphocytes Relative: 25.9 % (ref 12.0–46.0)
Lymphs Abs: 2.2 10*3/uL (ref 0.7–4.0)
MCHC: 34.5 g/dL (ref 30.0–36.0)
MCV: 86.6 fl (ref 78.0–100.0)
Monocytes Absolute: 0.9 10*3/uL (ref 0.1–1.0)
Monocytes Relative: 10.1 % (ref 3.0–12.0)
Neutro Abs: 5.3 10*3/uL (ref 1.4–7.7)
Neutrophils Relative %: 61.9 % (ref 43.0–77.0)
Platelets: 348 10*3/uL (ref 150.0–400.0)
RBC: 4.82 Mil/uL (ref 3.87–5.11)
RDW: 13.3 % (ref 11.5–15.5)
WBC: 8.5 10*3/uL (ref 4.0–10.5)

## 2023-03-30 LAB — CK: Total CK: 65 U/L (ref 7–177)

## 2023-03-30 LAB — SEDIMENTATION RATE: Sed Rate: 14 mm/hr (ref 0–30)

## 2023-03-31 ENCOUNTER — Ambulatory Visit: Payer: Medicare Other

## 2023-04-05 ENCOUNTER — Encounter: Payer: Self-pay | Admitting: Internal Medicine

## 2023-04-05 DIAGNOSIS — S8391XA Sprain of unspecified site of right knee, initial encounter: Secondary | ICD-10-CM | POA: Diagnosis not present

## 2023-04-05 DIAGNOSIS — M79646 Pain in unspecified finger(s): Secondary | ICD-10-CM | POA: Insufficient documentation

## 2023-04-05 NOTE — Assessment & Plan Note (Signed)
No upper symptoms reported.  Continue prilosec.  

## 2023-04-05 NOTE — Assessment & Plan Note (Signed)
Low cholesterol diet and exercise.  Continue praluent.  Follow lipid panel and liver function tests.  Lab Results  Component Value Date   CHOL 233 (H) 03/25/2023   HDL 48.60 03/25/2023   LDLCALC 97 04/05/2022   LDLDIRECT 150.0 03/25/2023   TRIG 296.0 (H) 03/25/2023   CHOLHDL 5 03/25/2023

## 2023-04-05 NOTE — Assessment & Plan Note (Addendum)
Continue praluent. 

## 2023-04-05 NOTE — Assessment & Plan Note (Signed)
Continues on prozac and cymbalta.  She is limiting xanax usage. Psychiatry.  

## 2023-04-05 NOTE — Assessment & Plan Note (Signed)
Low carb diet and exercise.  Follow met b and a1c.  Lab Results  Component Value Date   HGBA1C 6.1 03/25/2023

## 2023-04-05 NOTE — Assessment & Plan Note (Signed)
S/p previous surgery. Persistent pain.  Seeing [facility] - Dr Lovell Sheehan.  Had MRI.  F/u 04/2023.

## 2023-04-05 NOTE — Assessment & Plan Note (Signed)
On cymbalta and prozac.   Follow closely.  Previously tried buspar and gabapentin - did not feel helped.  Has xanax .5mg  to use sparingly.  F/u with psych.

## 2023-04-05 NOTE — Assessment & Plan Note (Signed)
Continue hctz and metoprolol.  Recheck wnl. Follow pressures.  Follow metabolic panel.  

## 2023-04-05 NOTE — Assessment & Plan Note (Signed)
Increased congestion and cough as outlined.  No increased sob.  Some wheezing noted on exam.  Will treat with omnicef as directed.  Prednisone taper.  Albuterol inhaler if needed.  Saline nasal spray/steroid nasal spray.  Follow.  Call with update.

## 2023-04-05 NOTE — Assessment & Plan Note (Signed)
Seeing Dr Lovell Sheehan.  Evaluated 02/2023 - MRI as outlined.  F/u 04/2023.

## 2023-04-05 NOTE — Assessment & Plan Note (Signed)
With muscle aches as outlined.  Has chronic back issues.  Check ck and esr.  Follow.  Keep f/u with [facility] and ortho.

## 2023-04-05 NOTE — Assessment & Plan Note (Signed)
Planning for lung screening - chest CT.

## 2023-04-05 NOTE — Assessment & Plan Note (Signed)
Seeing ortho.  F/u - Dr Joice Lofts.

## 2023-04-05 NOTE — Assessment & Plan Note (Signed)
Saw Dr Byrnett.  Recommended cologuard. Recommended holding colonoscopy until 2025-2026.   °

## 2023-04-06 ENCOUNTER — Other Ambulatory Visit: Payer: Self-pay | Admitting: Internal Medicine

## 2023-04-06 ENCOUNTER — Encounter: Payer: Self-pay | Admitting: Internal Medicine

## 2023-04-06 DIAGNOSIS — M25569 Pain in unspecified knee: Secondary | ICD-10-CM | POA: Insufficient documentation

## 2023-04-09 ENCOUNTER — Other Ambulatory Visit: Payer: Self-pay | Admitting: Internal Medicine

## 2023-04-09 NOTE — Telephone Encounter (Signed)
Rx ok'd for cymbalta #30 with 2 refills.  

## 2023-04-12 DIAGNOSIS — S8391XA Sprain of unspecified site of right knee, initial encounter: Secondary | ICD-10-CM | POA: Diagnosis not present

## 2023-04-12 DIAGNOSIS — M5442 Lumbago with sciatica, left side: Secondary | ICD-10-CM | POA: Diagnosis not present

## 2023-04-18 DIAGNOSIS — M1812 Unilateral primary osteoarthritis of first carpometacarpal joint, left hand: Secondary | ICD-10-CM | POA: Diagnosis not present

## 2023-04-25 ENCOUNTER — Ambulatory Visit
Admission: RE | Admit: 2023-04-25 | Discharge: 2023-04-25 | Disposition: A | Discharge status: Home / Self Care | Payer: Medicare Other | Source: Ambulatory Visit | Attending: Internal Medicine | Admitting: Internal Medicine

## 2023-04-25 DIAGNOSIS — Z1231 Encounter for screening mammogram for malignant neoplasm of breast: Secondary | ICD-10-CM | POA: Diagnosis not present

## 2023-04-26 ENCOUNTER — Other Ambulatory Visit: Payer: Self-pay | Admitting: Internal Medicine

## 2023-04-26 DIAGNOSIS — I10 Essential (primary) hypertension: Secondary | ICD-10-CM

## 2023-04-30 ENCOUNTER — Other Ambulatory Visit: Payer: Self-pay | Admitting: Internal Medicine

## 2023-05-04 ENCOUNTER — Ambulatory Visit (INDEPENDENT_AMBULATORY_CARE_PROVIDER_SITE_OTHER): Payer: Medicare Other | Admitting: Internal Medicine

## 2023-05-04 ENCOUNTER — Encounter: Payer: Self-pay | Admitting: Internal Medicine

## 2023-05-04 VITALS — BP 110/70 | HR 75 | Temp 98.0°F | Resp 16 | Ht 65.0 in | Wt 172.0 lb

## 2023-05-04 DIAGNOSIS — I10 Essential (primary) hypertension: Secondary | ICD-10-CM | POA: Diagnosis not present

## 2023-05-04 DIAGNOSIS — M5442 Lumbago with sciatica, left side: Secondary | ICD-10-CM

## 2023-05-04 DIAGNOSIS — M549 Dorsalgia, unspecified: Secondary | ICD-10-CM

## 2023-05-04 DIAGNOSIS — M5441 Lumbago with sciatica, right side: Secondary | ICD-10-CM | POA: Diagnosis not present

## 2023-05-04 DIAGNOSIS — G8929 Other chronic pain: Secondary | ICD-10-CM

## 2023-05-04 DIAGNOSIS — F320 Major depressive disorder, single episode, mild: Secondary | ICD-10-CM

## 2023-05-04 MED ORDER — MELOXICAM 15 MG PO TABS: 15.0000 mg | ORAL_TABLET | Freq: Every day | ORAL | 0 refills | Status: DC

## 2023-05-04 NOTE — Progress Notes (Signed)
Subjective:    Patient ID: Angel Riddle, female    DOB: 1956-09-03, 67 y.o.   MRN: [id number]  Patient here for  Chief Complaint  Patient presents with   Back Pain   Leg Pain   Joint Pain    HPI Work in appt - back and leg pain and joint pain.  Of note, saw Dr Joice Lofts - 04/18/23 - left thumb pain (base of thumb).  Recommended suspension arthroplasty of left thumb CMC joint. Also evaluated for left shoulder pain. S/p injection. On cymbalta and prozac.  Sees Dr Lovell Sheehan.  MRI 03/2023 - Prior L3-L5 PLIF with progressive adjacent segment disease at L1-L2 and L5-S1. New mild-to-moderate spinal canal stenosis at L1-L2. Progressive mild-to-moderate bilateral lateral recess stenosis and moderate bilateral neuroforaminal stenosis at L5-S1. Comes in today reporting increased low back pain and leg pain - right leg worse then left.  Increased stiffness - if sits for a while.  Also notices more stiffness in the am.  She is on lyrica - recently started.  Also has hydrocodone to take prn.  She is also taking cymbalta.  Has meloxicam to take prn.  Request to see Dr Yves Dill - for further evaluation and treatment.  Previous cough- reports improved.     Past Medical History:  Diagnosis Date   Allergy    Anxiety    Arthritis    Cancer (HCC)    basal cell nose   Chronic back pain    degenerative disc disease   Degenerative disc disease, lumbar    Depression    Dyspnea    with exertion / pain   Gallstones    GERD (gastroesophageal reflux disease)    Heart murmur    followed by PCP   Hx of colonic polyp    Hx: UTI (urinary tract infection)    Hypercholesterolemia    Hypertension    Panic attacks    H/O   Wears dentures    partial upper and lower   Past Surgical History:  Procedure Laterality Date   ABDOMINAL HYSTERECTOMY  1992   partial   BACK SURGERY     lumbar fusion   CATARACT EXTRACTION Bilateral 2010,2011   CHOLECYSTECTOMY  04-19-14   COLONOSCOPY  07/02/11   DR. Byrnett   COLONOSCOPY  WITH PROPOFOL N/A 09/25/2015   Procedure: COLONOSCOPY WITH PROPOFOL;  Surgeon: Midge Minium, MD;  Location: [facility];  Service: Endoscopy;  Laterality: N/A;   FOOT SURGERY Right 2009   MOUTH SURGERY     POLYPECTOMY  09/25/2015   Procedure: POLYPECTOMY;  Surgeon: Midge Minium, MD;  Location: [facility];  Service: Endoscopy;;   skin surgery Left 08/27/13   Basal cell removal-left nostril   TONSILLECTOMY AND ADENOIDECTOMY  1969   Family History  Problem Relation Age of Onset   Hyperlipidemia Mother    Hypertension Mother    Alcohol abuse Father    Hyperlipidemia Father    Heart disease Father    Diabetes Father    Hyperlipidemia Sister    Lung cancer Brother    Hyperlipidemia Brother    Breast cancer Neg Hx    Social History   Socioeconomic History   Marital status: Married    Spouse name: Not on file   Number of children: 2   Years of education: Not on file   Highest education level: Not on file  Occupational History   Not on file  Tobacco Use   Smoking status: Every Day  Packs/day: 1.00    Years: 37.00    Additional pack years: 0.00    Total pack years: 37.00    Types: Cigarettes    Start date: 08/07/2019    Last attempt to quit: 05/25/2021    Years since quitting: 1.9   Smokeless tobacco: Never   Tobacco comments:    Smokes 1/2 PPD   Vaping Use   Vaping Use: Former  Substance and Sexual Activity   Alcohol use: Yes    Alcohol/week: 1.0 standard drink of alcohol    Types: 1 Glasses of wine per week   Drug use: No   Sexual activity: Not on file  Other Topics Concern   Not on file  Social History Narrative   Not on file   Social Determinants of Health   Financial Resource Strain: Low Risk  (03/28/2023)   Overall Financial Resource Strain (CARDIA)    Difficulty of Paying Living Expenses: Not very hard  Food Insecurity: No Food Insecurity (03/28/2023)   Hunger Vital Sign    Worried About Running Out of Food in the Last Year: Never true    Ran  Out of Food in the Last Year: Never true  Transportation Needs: No Transportation Needs (03/28/2023)   PRAPARE - Administrator, Civil Service (Medical): No    Lack of Transportation (Non-Medical): No  Physical Activity: Not on file  Stress: No Stress Concern Present (03/28/2023)   [employer] of Occupational Health - Occupational Stress Questionnaire    Feeling of Stress : Not at all  Social Connections: Moderately Integrated (03/28/2023)   Social Connection and Isolation Panel [NHANES]    Frequency of Communication with Friends and Family: More than three times a week    Frequency of Social Gatherings with Friends and Family: More than three times a week    Attends Religious Services: More than 4 times per year    Active Member of [employer] or Organizations: Not on file    Attends Banker Meetings: More than 4 times per year    Marital Status: Widowed     Review of Systems  Constitutional:  Negative for appetite change and unexpected weight change.  HENT:  Negative for congestion and sinus pressure.   Respiratory:  Negative for cough, chest tightness and shortness of breath.   Cardiovascular:  Negative for chest pain and palpitations.  Gastrointestinal:  Negative for abdominal pain, diarrhea, nausea and vomiting.  Genitourinary:  Negative for difficulty urinating and dysuria.  Musculoskeletal:  Positive for back pain. Negative for myalgias.       Leg pain as outlined.   Skin:  Negative for color change and rash.  Neurological:  Negative for dizziness and headaches.  Psychiatric/Behavioral:  Negative for agitation and dysphoric mood.        Objective:     BP 110/70   Pulse 75   Temp 98 F (36.7 C)   Resp 16   Ht 5\' 5"  (1.651 m)   Wt 172 lb (78 kg)   SpO2 98%   BMI 28.62 kg/m  Wt Readings from Last 3 Encounters:  05/04/23 172 lb (78 kg)  03/29/23 166 lb (75.3 kg)  03/28/23 166 lb (75.3 kg)    Physical Exam Vitals reviewed.   Constitutional:      General: She is not in acute distress.    Appearance: Normal appearance.  HENT:     Head: Normocephalic and atraumatic.     Right Ear: External ear normal.  Left Ear: External ear normal.  Eyes:     General: No scleral icterus.       Right eye: No discharge.        Left eye: No discharge.     Conjunctiva/sclera: Conjunctivae normal.  Neck:     Thyroid: No thyromegaly.  Cardiovascular:     Rate and Rhythm: Normal rate and regular rhythm.  Pulmonary:     Effort: No respiratory distress.     Breath sounds: Normal breath sounds. No wheezing.  Abdominal:     General: Bowel sounds are normal.     Palpations: Abdomen is soft.     Tenderness: There is no abdominal tenderness.  Musculoskeletal:        General: No swelling or tenderness.     Cervical back: Neck supple. No tenderness.  Lymphadenopathy:     Cervical: No cervical adenopathy.  Skin:    Findings: No erythema or rash.  Neurological:     Mental Status: She is alert.  Psychiatric:        Mood and Affect: Mood normal.        Behavior: Behavior normal.      Outpatient Encounter Medications as of 05/04/2023  Medication Sig   pregabalin (LYRICA) 50 MG capsule Take 50 mg by mouth daily.   albuterol (PROVENTIL) (2.5 MG/3ML) 0.083% nebulizer solution Inhale into the lungs.   albuterol (VENTOLIN HFA) 108 (90 Base) MCG/ACT inhaler Inhale 2 puffs into the lungs every 6 (six) hours as needed for wheezing or shortness of breath.   Alirocumab (PRALUENT) 75 MG/ML SOAJ Inject 1 pen (75 mg) into the skin every 14 days.   ALPRAZolam (XANAX) 0.5 MG tablet Take 1 tablet (0.5 mg total) by mouth daily as needed for anxiety.   aspirin EC 81 MG tablet Take 1 tablet (81 mg total) by mouth daily. Swallow whole.   DULoxetine (CYMBALTA) 60 MG capsule Take 1 capsule (60 mg total) by mouth daily.   estradiol (ESTRACE) 0.1 MG/GM vaginal cream Estrogen Cream Instruction Discard applicator Apply pea sized amount to tip of  finger to urethra before bed. Wash hands well after application. Use Monday, Wednesday and Friday   FLUoxetine (PROZAC) 40 MG capsule Take 1 capsule (40 mg total) by mouth daily.   hydrochlorothiazide (MICROZIDE) 12.5 MG capsule Take 1 capsule (12.5 mg total) by mouth daily.   HYDROcodone-acetaminophen (NORCO) 7.5-325 MG tablet Take 1.5 tablets by mouth as needed. Using 1-2 times daily   meloxicam (MOBIC) 15 MG tablet Take 1 tablet (15 mg total) by mouth daily.   metoprolol tartrate (LOPRESSOR) 25 MG tablet Take 1 tablet (25 mg total) by mouth daily.   pantoprazole (PROTONIX) 40 MG tablet Take 1 tablet (40 mg total) by mouth daily.   potassium chloride (KLOR-CON) 10 MEQ tablet Take 1 tablet (10 mEq total) by mouth daily.   [DISCONTINUED] cefdinir (OMNICEF) 300 MG capsule Take 1 capsule (300 mg total) by mouth 2 (two) times daily.   [DISCONTINUED] meloxicam (MOBIC) 15 MG tablet Take 1 tablet (15 mg total) by mouth daily.   [DISCONTINUED] predniSONE (DELTASONE) 10 MG tablet Take 6 tablets x 1 day and then decrease by 1/2 tablet per day until down to zero mg.   No facility-administered encounter medications on file as of 05/04/2023.     Lab Results  Component Value Date   WBC 8.5 03/29/2023   HGB 14.4 03/29/2023   HCT 41.7 03/29/2023   PLT 348.0 03/29/2023   GLUCOSE 101 (H) 03/25/2023  CHOL 233 (H) 03/25/2023   TRIG 296.0 (H) 03/25/2023   HDL 48.60 03/25/2023   LDLDIRECT 150.0 03/25/2023   LDLCALC 97 04/05/2022   ALT 8 03/25/2023   AST 19 03/25/2023   NA 138 03/25/2023   K 3.8 03/25/2023   CL 101 03/25/2023   CREATININE 0.88 03/25/2023   BUN 17 03/25/2023   CO2 27 03/25/2023   TSH 1.93 12/23/2022   HGBA1C 6.1 03/25/2023    Mammogram 3D SCREEN BREAST BILATERAL  Result Date: 04/27/2023 CLINICAL DATA:  Screening. EXAM: DIGITAL SCREENING BILATERAL MAMMOGRAM WITH TOMOSYNTHESIS AND CAD TECHNIQUE: Bilateral screening digital craniocaudal and mediolateral oblique mammograms were  obtained. Bilateral screening digital breast tomosynthesis was performed. The images were evaluated with computer-aided detection. COMPARISON:  Previous exam(s). ACR Breast Density Category a: The breasts are almost entirely fatty. FINDINGS: There are no findings suspicious for malignancy. IMPRESSION: No mammographic evidence of malignancy. A result letter of this screening mammogram will be mailed directly to the patient. RECOMMENDATION: Screening mammogram in one year. (Code:SM-B-01Y) BI-RADS CATEGORY  1: Negative. Electronically Signed   By: Sande Brothers M.D.   On: 04/27/2023 09:36       Assessment & Plan:  Chronic midline back pain, unspecified back location Assessment & Plan: Has seen [facility]. MRI 03/2023 - Prior L3-L5 PLIF with progressive adjacent segment disease at L1-L2 and L5-S1. New mild-to-moderate spinal canal stenosis at L1-L2. Progressive mild-to-moderate bilateral lateral recess stenosis and moderate bilateral neuroforaminal stenosis at L5-S1. Comes in today reporting increased low back pain and leg pain - right leg worse then left.  Increased stiffness - if sits for a while.  Also notices more stiffness in the am.  She is on lyrica - recently started.  Also has hydrocodone to take prn.  She is also taking cymbalta.  Has meloxicam to take prn.  Request to see Dr Yves Dill - for further evaluation and treatment.  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 MRI 03/2023 - Prior L3-L5 PLIF with progressive adjacent segment disease at L1-L2 and L5-S1. New mild-to-moderate spinal canal stenosis at L1-L2. Progressive mild-to-moderate bilateral lateral recess stenosis and moderate bilateral neuroforaminal stenosis at L5-S1. Comes in today reporting increased low back pain and leg pain - right leg worse then left.  Increased stiffness - if sits for a while.  Also notices more stiffness  in the am.  She is on lyrica - recently started.  Also has hydrocodone to take prn.  She is also taking cymbalta.  Has meloxicam to take prn.  Request to see Dr Yves Dill - for further evaluation and treatment.   Depression, major, single episode, mild (HCC) Assessment & Plan: Continues on prozac and cymbalta.  She is limiting xanax usage. Psychiatry.    Primary hypertension Assessment & Plan: Continue hctz and metoprolol.  Recheck wnl. Follow pressures.  Follow metabolic panel.    Other orders -     Meloxicam; Take 1 tablet (15 mg total) by mouth daily.  Dispense: 30 tablet; Refill: 0     Dale [place], MD

## 2023-05-08 ENCOUNTER — Encounter: Payer: Self-pay | Admitting: Internal Medicine

## 2023-05-08 DIAGNOSIS — M545 Low back pain, unspecified: Secondary | ICD-10-CM | POA: Insufficient documentation

## 2023-05-08 NOTE — Assessment & Plan Note (Signed)
Continues on prozac and cymbalta.  She is limiting xanax usage. Psychiatry.  

## 2023-05-08 NOTE — Assessment & Plan Note (Signed)
Continue hctz and metoprolol.  Recheck wnl. Follow pressures.  Follow metabolic panel.  

## 2023-05-08 NOTE — Assessment & Plan Note (Signed)
Has seen [facility]. MRI 03/2023 - Prior L3-L5 PLIF with progressive adjacent segment disease at L1-L2 and L5-S1. New mild-to-moderate spinal canal stenosis at L1-L2. Progressive mild-to-moderate bilateral lateral recess stenosis and moderate bilateral neuroforaminal stenosis at L5-S1. Comes in today reporting increased low back pain and leg pain - right leg worse then left.  Increased stiffness - if sits for a while.  Also notices more stiffness in the am.  She is on lyrica - recently started.  Also has hydrocodone to take prn.  She is also taking cymbalta.  Has meloxicam to take prn.  Request to see Dr Chasnis - for further evaluation and treatment. 

## 2023-05-08 NOTE — Addendum Note (Signed)
Addended by: Charm Barges on: 05/08/2023 07:38 PM   Modules accepted: Level of Service

## 2023-05-08 NOTE — Assessment & Plan Note (Signed)
Has seen [facility]. MRI 03/2023 - Prior L3-L5 PLIF with progressive adjacent segment disease at L1-L2 and L5-S1. New mild-to-moderate spinal canal stenosis at L1-L2. Progressive mild-to-moderate bilateral lateral recess stenosis and moderate bilateral neuroforaminal stenosis at L5-S1. Comes in today reporting increased low back pain and leg pain - right leg worse then left.  Increased stiffness - if sits for a while.  Also notices more stiffness in the am.  She is on lyrica - recently started.  Also has hydrocodone to take prn.  She is also taking cymbalta.  Has meloxicam to take prn.  Request to see Dr Yves Dill - for further evaluation and treatment.

## 2023-05-09 ENCOUNTER — Other Ambulatory Visit: Payer: Self-pay | Admitting: Surgery

## 2023-05-09 DIAGNOSIS — M5416 Radiculopathy, lumbar region: Secondary | ICD-10-CM | POA: Diagnosis not present

## 2023-05-09 DIAGNOSIS — M5136 Other intervertebral disc degeneration, lumbar region: Secondary | ICD-10-CM | POA: Diagnosis not present

## 2023-05-09 DIAGNOSIS — N390 Urinary tract infection, site not specified: Secondary | ICD-10-CM | POA: Diagnosis not present

## 2023-05-09 DIAGNOSIS — R509 Fever, unspecified: Secondary | ICD-10-CM | POA: Diagnosis not present

## 2023-05-09 DIAGNOSIS — R110 Nausea: Secondary | ICD-10-CM | POA: Diagnosis not present

## 2023-05-09 DIAGNOSIS — M48062 Spinal stenosis, lumbar region with neurogenic claudication: Secondary | ICD-10-CM | POA: Diagnosis not present

## 2023-05-09 DIAGNOSIS — R3915 Urgency of urination: Secondary | ICD-10-CM | POA: Diagnosis not present

## 2023-05-10 ENCOUNTER — Other Ambulatory Visit: Payer: Self-pay | Admitting: Cardiovascular Disease

## 2023-05-10 ENCOUNTER — Other Ambulatory Visit: Payer: Self-pay | Admitting: Internal Medicine

## 2023-05-10 NOTE — Telephone Encounter (Signed)
Hi,  Could you schedule this patient a follow up appointment? The patient was last seen by Dr. Mariah Milling on 04-13-22. His notes states that she should follow up when needed. If patient declines to make an appointment, she has the option of seeking refills through PCP. Thank you so much.

## 2023-05-10 NOTE — Telephone Encounter (Signed)
Pt is scheduled on 7/1. Pt has surgery in june, so requested her appt for july.

## 2023-05-12 DIAGNOSIS — N390 Urinary tract infection, site not specified: Secondary | ICD-10-CM | POA: Diagnosis not present

## 2023-05-12 DIAGNOSIS — I700 Atherosclerosis of aorta: Secondary | ICD-10-CM | POA: Diagnosis not present

## 2023-05-12 DIAGNOSIS — D72829 Elevated white blood cell count, unspecified: Secondary | ICD-10-CM | POA: Diagnosis not present

## 2023-05-16 ENCOUNTER — Encounter
Admission: RE | Admit: 2023-05-16 | Discharge: 2023-05-16 | Disposition: A | Discharge status: Home / Self Care | Payer: Medicare Other | Source: Ambulatory Visit | Attending: Surgery | Admitting: Surgery

## 2023-05-16 ENCOUNTER — Encounter: Payer: Self-pay | Admitting: Surgery

## 2023-05-16 VITALS — Ht 65.0 in | Wt 164.0 lb

## 2023-05-16 DIAGNOSIS — Z01818 Encounter for other preprocedural examination: Secondary | ICD-10-CM | POA: Insufficient documentation

## 2023-05-16 DIAGNOSIS — I10 Essential (primary) hypertension: Secondary | ICD-10-CM | POA: Diagnosis not present

## 2023-05-16 DIAGNOSIS — Z01812 Encounter for preprocedural laboratory examination: Secondary | ICD-10-CM

## 2023-05-16 HISTORY — DX: Atherosclerosis of aorta: I70.0

## 2023-05-16 HISTORY — DX: Cystocele, unspecified: N81.10

## 2023-05-16 HISTORY — DX: Spinal stenosis, lumbar region without neurogenic claudication: M48.061

## 2023-05-16 HISTORY — DX: Anal fissure, unspecified: K60.2

## 2023-05-16 HISTORY — DX: Deficiency of other specified B group vitamins: E53.8

## 2023-05-16 LAB — BASIC METABOLIC PANEL
Anion gap: 11 (ref 5–15)
BUN: 16 mg/dL (ref 8–23)
CO2: 24 mmol/L (ref 22–32)
Calcium: 8.8 mg/dL — ABNORMAL LOW (ref 8.9–10.3)
Chloride: 104 mmol/L (ref 98–111)
Creatinine, Ser: 0.91 mg/dL (ref 0.44–1.00)
GFR, Estimated: >60 mL/min (ref >60)
Glucose, Bld: 98 mg/dL (ref 70–99)
Potassium: 3.8 mmol/L (ref 3.5–5.1)
Sodium: 139 mmol/L (ref 135–145)

## 2023-05-16 NOTE — Patient Instructions (Addendum)
Your procedure is scheduled on: Wednesday, June 19 Report to the Registration Desk on the 1st floor of the [facility] To find out your arrival time, please call [phone] between 1PM - 3PM on: Tuesday, June 18 If your arrival time is 6:00 am, do not arrive before that time as the [facility] entrance doors do not open until 6:00 am.  REMEMBER: Instructions that are not followed completely may result in serious medical risk, up to and including death; or upon the discretion of your surgeon and anesthesiologist your surgery may need to be rescheduled.  Do not eat food after midnight the night before surgery.  No gum chewing or hard candies.  You may however, drink CLEAR liquids up to 2 hours before you are scheduled to arrive for your surgery. Do not drink anything within 2 hours of your scheduled arrival time.  Clear liquids include: - water  - apple juice without pulp - gatorade (not RED colors) - black coffee or tea (Do NOT add milk or creamers to the coffee or tea) Do NOT drink anything that is not on this list.  In addition, your doctor has ordered for you to drink the provided:  Ensure Pre-Surgery Clear Carbohydrate Drink  Drinking this carbohydrate drink up to two hours before surgery helps to reduce insulin resistance and improve patient outcomes. Please complete drinking 2 hours before scheduled arrival time.  One week prior to surgery: starting June 12 Stop aspirin, meloxicam and Anti-inflammatories (NSAIDS) such as Advil, Aleve, Ibuprofen, Motrin, Naproxen, Naprosyn and Aspirin based products such as Excedrin, Goody's Powder, BC Powder. Stop ANY OVER THE COUNTER supplements until after surgery. You may however, continue to take Tylenol if needed for pain up until the day of surgery.  Continue taking all prescribed medications  TAKE ONLY THESE MEDICATIONS THE MORNING OF SURGERY WITH A SIP OF WATER:  Duloxetine (Cymbalta) Fluoxetine (Prozac) Metoprolol Pantoprazole  (Protonix) - (take one the night before and one on the morning of surgery - helps to prevent nausea after surgery.) Pregabalin (Lyrica) Albuterol inhaler  Bring your albuterol inhaler to the hospital.  No Alcohol for 24 hours before or after surgery.  No Smoking including e-cigarettes for 24 hours before surgery.  No chewable tobacco products for at least 6 hours before surgery.  No nicotine patches on the day of surgery.  Do not use any "recreational" drugs for at least a week (preferably 2 weeks) before your surgery.  Please be advised that the combination of cocaine and anesthesia may have negative outcomes, up to and including death. If you test positive for cocaine, your surgery will be cancelled.  On the morning of surgery brush your teeth with toothpaste and water, you may rinse your mouth with mouthwash if you wish. Do not swallow any toothpaste or mouthwash.  Use CHG Soap as directed on instruction sheet.  Do not wear jewelry, make-up, hairpins, clips or nail polish.  Do not wear lotions, powders, or perfumes.   Do not shave body hair from the neck down 48 hours before surgery.  Contact lenses, hearing aids and dentures may not be worn into surgery.  Do not bring valuables to the hospital. [facility] is not responsible for any missing/lost belongings or valuables.   Notify your doctor if there is any change in your medical condition (cold, fever, infection).  Wear comfortable clothing (specific to your surgery type) to the hospital.  After surgery, you can help prevent lung complications by doing breathing exercises.  Take  deep breaths and cough every 1-2 hours. Your doctor may order a device called an Incentive Spirometer to help you take deep breaths.  If you are being discharged the day of surgery, you will not be allowed to drive home. You will need a responsible individual to drive you home and stay with you for 24 hours after surgery.   If you are taking  public transportation, you will need to have a responsible individual with you.  Please call the Pre-admissions Testing Dept. at [phone] if you have any questions about these instructions.  Surgery Visitation Policy:  Patients having surgery or a procedure may have two visitors.  Children under the age of 26 must have an adult with them who is not the patient.     Preparing for Surgery with CHLORHEXIDINE GLUCONATE (CHG) Soap  Chlorhexidine Gluconate (CHG) Soap  o An antiseptic cleaner that kills germs and bonds with the skin to continue killing germs even after washing  o Used for showering the night before surgery and morning of surgery  Before surgery, you can play an important role by reducing the number of germs on your skin.  CHG (Chlorhexidine gluconate) soap is an antiseptic cleanser which kills germs and bonds with the skin to continue killing germs even after washing.  Please do not use if you have an allergy to CHG or antibacterial soaps. If your skin becomes reddened/irritated stop using the CHG.  1. Shower the NIGHT BEFORE SURGERY and the MORNING OF SURGERY with CHG soap.  2. If you choose to wash your hair, wash your hair first as usual with your normal shampoo.  3. After shampooing, rinse your hair and body thoroughly to remove the shampoo.  4. Use CHG as you would any other liquid soap. You can apply CHG directly to the skin and wash gently with a scrungie or a clean washcloth.  5. Apply the CHG soap to your body only from the neck down. Do not use on open wounds or open sores. Avoid contact with your eyes, ears, mouth, and genitals (private parts). Wash face and genitals (private parts) with your normal soap.  6. Wash thoroughly, paying special attention to the area where your surgery will be performed.  7. Thoroughly rinse your body with warm water.  8. Do not shower/wash with your normal soap after using and rinsing off the CHG soap.  9. Pat yourself  dry with a clean towel.  10. Wear clean pajamas to bed the night before surgery.  12. Place clean sheets on your bed the night of your first shower and do not sleep with pets.  13. Shower again with the CHG soap on the day of surgery prior to arriving at the hospital.  14. Do not apply any deodorants/lotions/powders.  15. Please wear clean clothes to the hospital.

## 2023-05-24 MED ORDER — CEFAZOLIN SODIUM-DEXTROSE 2-4 GM/100ML-% IV SOLN
2.0000 g | INTRAVENOUS | Status: AC
  Administered 2023-05-25: 2 g via INTRAVENOUS

## 2023-05-24 MED ORDER — ORAL CARE MOUTH RINSE: 15.0000 mL | Freq: Once | OROMUCOSAL | Status: AC

## 2023-05-24 MED ORDER — LACTATED RINGERS IV SOLN: INTRAVENOUS | Status: DC

## 2023-05-24 MED ORDER — CHLORHEXIDINE GLUCONATE 0.12 % MT SOLN
15.0000 mL | Freq: Once | OROMUCOSAL | Status: AC
  Administered 2023-05-25: 15 mL via OROMUCOSAL

## 2023-05-25 ENCOUNTER — Other Ambulatory Visit: Payer: Self-pay

## 2023-05-25 ENCOUNTER — Encounter: Payer: Self-pay | Admitting: Surgery

## 2023-05-25 ENCOUNTER — Ambulatory Visit: Payer: Medicare Other | Admitting: Urgent Care

## 2023-05-25 ENCOUNTER — Ambulatory Visit
Admission: RE | Admit: 2023-05-25 | Discharge: 2023-05-25 | Disposition: A | Discharge status: Home / Self Care | Payer: Medicare Other | Source: Ambulatory Visit | Attending: Surgery | Admitting: Surgery

## 2023-05-25 ENCOUNTER — Encounter
Admission: RE | Disposition: A | Discharge status: Home / Self Care | Payer: Self-pay | Source: Ambulatory Visit | Attending: Surgery

## 2023-05-25 ENCOUNTER — Ambulatory Visit: Payer: Medicare Other

## 2023-05-25 DIAGNOSIS — M1812 Unilateral primary osteoarthritis of first carpometacarpal joint, left hand: Secondary | ICD-10-CM | POA: Diagnosis not present

## 2023-05-25 DIAGNOSIS — I10 Essential (primary) hypertension: Secondary | ICD-10-CM | POA: Insufficient documentation

## 2023-05-25 DIAGNOSIS — F17210 Nicotine dependence, cigarettes, uncomplicated: Secondary | ICD-10-CM | POA: Diagnosis not present

## 2023-05-25 HISTORY — DX: Long term (current) use of aspirin: Z79.82

## 2023-05-25 HISTORY — PX: CARPOMETACARPAL (CMC) FUSION OF THUMB: SHX6290

## 2023-05-25 HISTORY — DX: Basal cell carcinoma of skin of nose: C44.311

## 2023-05-25 HISTORY — DX: Other forms of dyspnea: R06.09

## 2023-05-25 HISTORY — DX: Presence of dental prosthetic device (complete) (partial): Z97.2

## 2023-05-25 SURGERY — CARPOMETACARPAL (CMC) FUSION OF THUMB
Anesthesia: General | Site: Thumb | Laterality: Left

## 2023-05-25 MED ORDER — EPHEDRINE SULFATE (PRESSORS) 50 MG/ML IJ SOLN
INTRAMUSCULAR | Status: DC | PRN
  Administered 2023-05-25: 10 mg via INTRAVENOUS

## 2023-05-25 MED ORDER — OXYCODONE HCL 5 MG PO TABS
5.0000 mg | ORAL_TABLET | ORAL | Status: AC
  Administered 2023-05-25: 5 mg via ORAL

## 2023-05-25 MED ORDER — ACETAMINOPHEN 10 MG/ML IV SOLN
INTRAVENOUS | Status: AC
  Filled 2023-05-25: qty 100

## 2023-05-25 MED ORDER — ONDANSETRON HCL 4 MG/2ML IJ SOLN
INTRAMUSCULAR | Status: DC | PRN
  Administered 2023-05-25: 4 mg via INTRAVENOUS

## 2023-05-25 MED ORDER — FENTANYL CITRATE (PF) 100 MCG/2ML IJ SOLN
INTRAMUSCULAR | Status: DC | PRN
  Administered 2023-05-25: 50 ug via INTRAVENOUS
  Administered 2023-05-25 (×2): 25 ug via INTRAVENOUS

## 2023-05-25 MED ORDER — 0.9 % SODIUM CHLORIDE (POUR BTL) OPTIME
TOPICAL | Status: DC | PRN
  Administered 2023-05-25: 200 mL

## 2023-05-25 MED ORDER — PHENYLEPHRINE 80 MCG/ML (10ML) SYRINGE FOR IV PUSH (FOR BLOOD PRESSURE SUPPORT)
PREFILLED_SYRINGE | INTRAVENOUS | Status: AC
  Filled 2023-05-25: qty 10

## 2023-05-25 MED ORDER — KETOROLAC TROMETHAMINE 15 MG/ML IJ SOLN
15.0000 mg | Freq: Once | INTRAMUSCULAR | Status: AC
  Administered 2023-05-25: 15 mg via INTRAVENOUS

## 2023-05-25 MED ORDER — EPHEDRINE 5 MG/ML INJ
INTRAVENOUS | Status: AC
  Filled 2023-05-25: qty 5

## 2023-05-25 MED ORDER — KETOROLAC TROMETHAMINE 15 MG/ML IJ SOLN
INTRAMUSCULAR | Status: AC
  Filled 2023-05-25: qty 1

## 2023-05-25 MED ORDER — MIDAZOLAM HCL 2 MG/2ML IJ SOLN
INTRAMUSCULAR | Status: DC | PRN
  Administered 2023-05-25: 2 mg via INTRAVENOUS

## 2023-05-25 MED ORDER — FENTANYL CITRATE (PF) 100 MCG/2ML IJ SOLN
INTRAMUSCULAR | Status: AC
  Filled 2023-05-25: qty 2

## 2023-05-25 MED ORDER — OXYCODONE HCL 5 MG PO TABS
ORAL_TABLET | ORAL | Status: AC
  Filled 2023-05-25: qty 1

## 2023-05-25 MED ORDER — LIDOCAINE HCL (CARDIAC) PF 100 MG/5ML IV SOSY
PREFILLED_SYRINGE | INTRAVENOUS | Status: DC | PRN
  Administered 2023-05-25: 100 mg via INTRAVENOUS

## 2023-05-25 MED ORDER — OXYCODONE HCL 5 MG/5ML PO SOLN: 5.0000 mg | Freq: Once | ORAL | Status: AC | PRN

## 2023-05-25 MED ORDER — MIDAZOLAM HCL 2 MG/2ML IJ SOLN
INTRAMUSCULAR | Status: AC
  Filled 2023-05-25: qty 2

## 2023-05-25 MED ORDER — PROMETHAZINE HCL 25 MG/ML IJ SOLN: 6.2500 mg | INTRAMUSCULAR | Status: DC | PRN

## 2023-05-25 MED ORDER — HYDROCODONE-ACETAMINOPHEN 7.5-325 MG PO TABS: 1.0000 | ORAL_TABLET | Freq: Four times a day (QID) | ORAL | 0 refills | Status: DC | PRN

## 2023-05-25 MED ORDER — CEFAZOLIN SODIUM-DEXTROSE 2-4 GM/100ML-% IV SOLN
INTRAVENOUS | Status: AC
  Filled 2023-05-25: qty 100

## 2023-05-25 MED ORDER — PROPOFOL 10 MG/ML IV BOLUS
INTRAVENOUS | Status: DC | PRN
  Administered 2023-05-25: 150 mg via INTRAVENOUS

## 2023-05-25 MED ORDER — CHLORHEXIDINE GLUCONATE 0.12 % MT SOLN
OROMUCOSAL | Status: AC
  Filled 2023-05-25: qty 15

## 2023-05-25 MED ORDER — DEXAMETHASONE SODIUM PHOSPHATE 10 MG/ML IJ SOLN
INTRAMUSCULAR | Status: DC | PRN
  Administered 2023-05-25: 10 mg via INTRAVENOUS

## 2023-05-25 MED ORDER — BUPIVACAINE HCL (PF) 0.5 % IJ SOLN
INTRAMUSCULAR | Status: DC | PRN
  Administered 2023-05-25: 10 mL

## 2023-05-25 MED ORDER — DROPERIDOL 2.5 MG/ML IJ SOLN: 0.6250 mg | Freq: Once | INTRAMUSCULAR | Status: DC | PRN

## 2023-05-25 MED ORDER — PROPOFOL 10 MG/ML IV BOLUS
INTRAVENOUS | Status: AC
  Filled 2023-05-25: qty 20

## 2023-05-25 MED ORDER — BUPIVACAINE HCL (PF) 0.5 % IJ SOLN
INTRAMUSCULAR | Status: AC
  Filled 2023-05-25: qty 30

## 2023-05-25 MED ORDER — ACETAMINOPHEN 10 MG/ML IV SOLN
INTRAVENOUS | Status: DC | PRN
  Administered 2023-05-25: 1000 mg via INTRAVENOUS

## 2023-05-25 MED ORDER — LIDOCAINE HCL (PF) 2 % IJ SOLN
INTRAMUSCULAR | Status: AC
  Filled 2023-05-25: qty 5

## 2023-05-25 MED ORDER — FENTANYL CITRATE (PF) 100 MCG/2ML IJ SOLN
25.0000 ug | INTRAMUSCULAR | Status: AC | PRN
  Administered 2023-05-25 (×8): 25 ug via INTRAVENOUS

## 2023-05-25 MED ORDER — ONDANSETRON HCL 4 MG/2ML IJ SOLN
INTRAMUSCULAR | Status: AC
  Filled 2023-05-25: qty 2

## 2023-05-25 MED ORDER — PHENYLEPHRINE HCL-NACL 20-0.9 MG/250ML-% IV SOLN
INTRAVENOUS | Status: DC | PRN
  Administered 2023-05-25: 53.333 ug/min via INTRAVENOUS

## 2023-05-25 MED ORDER — ACETAMINOPHEN 10 MG/ML IV SOLN: 1000.0000 mg | Freq: Once | INTRAVENOUS | Status: DC | PRN

## 2023-05-25 MED ORDER — OXYCODONE HCL 5 MG PO TABS
5.0000 mg | ORAL_TABLET | Freq: Once | ORAL | Status: AC | PRN
  Administered 2023-05-25: 5 mg via ORAL

## 2023-05-25 MED ORDER — PHENYLEPHRINE 80 MCG/ML (10ML) SYRINGE FOR IV PUSH (FOR BLOOD PRESSURE SUPPORT)
PREFILLED_SYRINGE | INTRAVENOUS | Status: DC | PRN
  Administered 2023-05-25: 160 ug via INTRAVENOUS
  Administered 2023-05-25: 80 ug via INTRAVENOUS

## 2023-05-25 MED ORDER — DEXAMETHASONE SODIUM PHOSPHATE 10 MG/ML IJ SOLN
INTRAMUSCULAR | Status: AC
  Filled 2023-05-25: qty 1

## 2023-05-25 MED ORDER — KETOROLAC TROMETHAMINE 30 MG/ML IJ SOLN
INTRAMUSCULAR | Status: AC
  Filled 2023-05-25: qty 1

## 2023-05-25 SURGICAL SUPPLY — 46 items
APL PRP STRL LF DISP 70% ISPRP (MISCELLANEOUS) ×1
BLADE DEBAKEY 8.0 (BLADE) ×1 IMPLANT
BLADE OSC/SAGITTAL 5.5X25 (BLADE) ×1 IMPLANT
BLADE SURG 15 STRL LF DISP TIS (BLADE) ×2 IMPLANT
BLADE SURG 15 STRL SS (BLADE) ×2
BNDG CMPR 5X3 KNIT ELC UNQ LF (GAUZE/BANDAGES/DRESSINGS) ×1
BNDG CMPR 5X4 CHSV STRCH STRL (GAUZE/BANDAGES/DRESSINGS) ×1
BNDG COHESIVE 4X5 TAN STRL LF (GAUZE/BANDAGES/DRESSINGS) ×1 IMPLANT
BNDG ELASTIC 3INX 5YD STR LF (GAUZE/BANDAGES/DRESSINGS) ×1 IMPLANT
BNDG ESMARCH 4 X 12 STRL LF (GAUZE/BANDAGES/DRESSINGS) ×1
BNDG ESMARCH 4X12 STRL LF (GAUZE/BANDAGES/DRESSINGS) ×1 IMPLANT
BNDG GZE 12X3 1 PLY HI ABS (GAUZE/BANDAGES/DRESSINGS) ×1
BNDG STRETCH GAUZE 3IN X12FT (GAUZE/BANDAGES/DRESSINGS) ×1 IMPLANT
BUR 4X55 1 (BURR) ×1 IMPLANT
CHLORAPREP W/TINT 26 (MISCELLANEOUS) ×1 IMPLANT
CUFF TOURN SGL QUICK 18X4 (TOURNIQUET CUFF) ×1 IMPLANT
DRAPE FLUOR MINI C-ARM 54X84 (DRAPES) ×1 IMPLANT
FORCEPS JEWEL BIP 4-3/4 STR (INSTRUMENTS) ×1 IMPLANT
GAUZE XEROFORM 1X8 LF (GAUZE/BANDAGES/DRESSINGS) ×1 IMPLANT
GLOVE BIO SURGEON STRL SZ8 (GLOVE) ×2 IMPLANT
GLOVE INDICATOR 8.0 STRL GRN (GLOVE) ×1 IMPLANT
GOWN STRL REUS W/ TWL XL LVL3 (GOWN DISPOSABLE) ×1 IMPLANT
GOWN STRL REUS W/TWL XL LVL3 (GOWN DISPOSABLE) ×1
KIT TURNOVER KIT A (KITS) ×1 IMPLANT
MANIFOLD NEPTUNE II (INSTRUMENTS) ×1 IMPLANT
NS IRRIG 500ML POUR BTL (IV SOLUTION) ×1 IMPLANT
PACK EXTREMITY ARMC (MISCELLANEOUS) ×1 IMPLANT
PAD CAST 3X4 CTTN HI CHSV (CAST SUPPLIES) ×2 IMPLANT
PADDING CAST COTTON 3X4 STRL (CAST SUPPLIES) ×2
PASSER SUT SWANSON 36MM LOOP (INSTRUMENTS) IMPLANT
SPLINT CAST 1 STEP 3X12 (MISCELLANEOUS) ×1 IMPLANT
STOCKINETTE 48X4 2 PLY STRL (GAUZE/BANDAGES/DRESSINGS) ×1 IMPLANT
STOCKINETTE IMPERVIOUS 9X36 MD (GAUZE/BANDAGES/DRESSINGS) ×1 IMPLANT
STOCKINETTE STRL 4IN 9604848 (GAUZE/BANDAGES/DRESSINGS) ×1 IMPLANT
STRIP CLOSURE SKIN 1/2X4 (GAUZE/BANDAGES/DRESSINGS) ×1 IMPLANT
SUT ETHIBOND 0 MO6 C/R (SUTURE) ×1 IMPLANT
SUT PROLENE 4 0 PS 2 18 (SUTURE) ×1 IMPLANT
SUT VIC AB 2-0 CT2 27 (SUTURE) ×1 IMPLANT
SUT VIC AB 2-0 SH 27 (SUTURE) ×1
SUT VIC AB 2-0 SH 27XBRD (SUTURE) ×1 IMPLANT
SUT VIC AB 3-0 SH 27 (SUTURE) ×1
SUT VIC AB 3-0 SH 27X BRD (SUTURE) ×1 IMPLANT
SYSTEM IMPLANT TIGHTROPE MINI (Anchor) IMPLANT
TRAP FLUID SMOKE EVACUATOR (MISCELLANEOUS) ×1 IMPLANT
WATER STERILE IRR 500ML POUR (IV SOLUTION) ×1 IMPLANT
WIRE Z .062 C-WIRE SPADE TIP (WIRE) IMPLANT

## 2023-05-25 NOTE — Discharge Instructions (Addendum)
Orthopedic discharge instructions: Keep splint dry and intact. Keep hand elevated above heart level. Apply ice to affected area frequently. Resume meloxicam 15 mg daily with food. Take pain medication as prescribed or ES Tylenol when needed.  Return for follow-up in 10-14 days or as scheduled.     AMBULATORY SURGERY  DISCHARGE INSTRUCTIONS   The drugs that you were given will stay in your system until tomorrow so for the next 24 hours you should not:  Drive an automobile Make any legal decisions Drink any alcoholic beverage   You may resume regular meals tomorrow.  Today it is better to start with liquids and gradually work up to solid foods.  You may eat anything you prefer, but it is better to start with liquids, then soup and crackers, and gradually work up to solid foods.   Please notify your doctor immediately if you have any unusual bleeding, trouble breathing, redness and pain at the surgery site, drainage, fever, or pain not relieved by medication.    Additional Instructions:        Please contact your physician with any problems or Same Day Surgery at [phone], Monday through Friday 6 am to 4 pm, or [place] at [facility] number at [phone].

## 2023-05-25 NOTE — Anesthesia Preprocedure Evaluation (Signed)
Anesthesia Evaluation  Patient identified by MRN, date of birth, ID band Patient awake    Reviewed: Allergy & Precautions, H&P , NPO status , Patient's Chart, lab work & pertinent test results, reviewed documented beta blocker date and time   Airway Mallampati: II  TM Distance: >3 FB Neck ROM: full    Dental  (+) Teeth Intact   Pulmonary neg pulmonary ROS, Current Smoker and Patient abstained from smoking.   Pulmonary exam normal        Cardiovascular Exercise Tolerance: Good hypertension, On Medications + DOE  Normal cardiovascular exam+ Valvular Problems/Murmurs  Rate:Normal     Neuro/Psych  PSYCHIATRIC DISORDERS Anxiety Depression    negative neurological ROS     GI/Hepatic Neg liver ROS,GERD  Medicated,,  Endo/Other  negative endocrine ROS    Renal/GU negative Renal ROS  negative genitourinary   Musculoskeletal   Abdominal   Peds  Hematology negative hematology ROS (+)   Anesthesia Other Findings   Reproductive/Obstetrics negative OB ROS                             Anesthesia Physical Anesthesia Plan  ASA: 2  Anesthesia Plan: General LMA   Post-op Pain Management:    Induction:   PONV Risk Score and Plan: 3  Airway Management Planned:   Additional Equipment:   Intra-op Plan:   Post-operative Plan:   Informed Consent: I have reviewed the patients History and Physical, chart, labs and discussed the procedure including the risks, benefits and alternatives for the proposed anesthesia with the patient or authorized representative who has indicated his/her understanding and acceptance.       Plan Discussed with: CRNA  Anesthesia Plan Comments:        Anesthesia Quick Evaluation

## 2023-05-25 NOTE — Anesthesia Procedure Notes (Signed)
Procedure Name: LMA Insertion Date/Time: 05/25/2023 10:55 AM  Performed by: Morene Crocker, CRNAPre-anesthesia Checklist: Patient identified, Patient being monitored, Timeout performed, Emergency Drugs available and Suction available Patient Re-evaluated:Patient Re-evaluated prior to induction Oxygen Delivery Method: Circle system utilized Preoxygenation: Pre-oxygenation with 100% oxygen Induction Type: IV induction Ventilation: Mask ventilation without difficulty LMA: LMA inserted LMA Size: 3.0 Tube type: Oral Number of attempts: 1 Placement Confirmation: positive ETCO2 and breath sounds checked- equal and bilateral Tube secured with: Tape Dental Injury: Teeth and Oropharynx as per pre-operative assessment  Comments: Smooth atraumatic placement of LMA. No complications noted.

## 2023-05-25 NOTE — H&P (Signed)
History of Present Illness:  Angel Riddle is a 67 y.o. female who has severe left hand and thumb pain. She has had previous diagnosis of CMC joint arthritis. She has failed conservative treatment including NSAID's, injections, and activity modification. Patient reports that she is feeling okay today, with only mild discomfort in her left hand. She states that when it does act up it becomes debilitating to where she feels she cannot even use her hand or grip anything. She denies any numbness or tingling or radiation of pain. She has gotten previous injections which have helped, but over less and less time with most recent injections. She has requested operative intervention for relief of her pain and symptoms. She is still waiting on clearance from her cardiologist. Discussed calling them to set up an appointment before the operation. Patient is not a diabetic, has no pulmonary history. Has history of hypertension and aortic atherosclerosis. Patient does smoke about a pack of cigarettes a day. Has no issues with anesthesia previously.  Social Hx: Patient does not work, denies any illicit drug use. Admits to smoking about a pack of cigarettes a day  Allergies:   Adhesive Tape-Silicones Other (See Comments)  "Pulls skin"; anything that does not pull is recommended  Doxycycline Nausea and Vomiting  Gadolinium-Containing Contrast Media Other (See Comments)  Levofloxacin Muscle Pain  Other Other (See Comments)  Other reaction(s): Other (See Comments)  Silicone Other (See Comments)  Other reaction(s): Other (See Comments) "Pulls skin"; anything that does not pull is recommended  Iodinated Contrast Media Nausea And Vomiting and Vomiting  During a CT scan of the kidneys   Current Outpatient Medications: albuterol (PROVENTIL) 2.5 mg /3 mL (0.083 %) nebulizer solution Take 2.5 mg by nebulization every 4 (four) hours as needed for Wheezing  ALPRAZolam (XANAX) 0.5 MG tablet Take 0.5 mg by mouth 2 (two)  times daily as needed  cefdinir (OMNICEF) 300 mg capsule Take 1 capsule (300 mg total) by mouth 2 (two) times daily for 7 days 14 capsule 0  diphenhydrAMINE (BENADRYL) 50 MG capsule Take 1 capsule (50 mg total) by mouth as directed Take 1 capsule 1 hour prior to injection 1 capsule 0  FLUoxetine (PROZAC) 40 MG capsule Take 1 capsule by mouth once daily.  hydroCHLOROthiazide (HYDRODIURIL) 25 MG tablet Take 25 mg by mouth once daily  HYDROcodone-acetaminophen (NORCO) 7.5-325 mg tablet Take 1 tablet by mouth every 6 (six) hours as needed  meloxicam (MOBIC) 15 MG tablet Take 15 mg by mouth once daily.  metoprolol tartrate (LOPRESSOR) 25 MG tablet Take 1 tablet by mouth once daily  pantoprazole (PROTONIX) 40 MG DR tablet Take 1 tablet (40 mg total) by mouth daily.  potassium chloride (KLOR-CON) 10 MEQ ER tablet Take 10 mEq by mouth once daily.  PRALUENT PEN 75 mg/mL pen injector Inject 75 mg subcutaneously every 14 (fourteen) days  pregabalin (LYRICA) 75 MG capsule Take 1 capsule (75 mg total) by mouth 2 (two) times daily 60 capsule 2  promethazine (PHENERGAN) 12.5 MG tablet Take 1 tablet (12.5 mg total) by mouth every 8 (eight) hours as needed for Nausea 20 tablet 0   Past Medical History:  Allergy  Anxiety  Arthritis  Chronic back pain  DDD (degenerative disc disease), lumbar  Depression  Dyspnea  Gallstones  GERD (gastroesophageal reflux disease)  Heart murmur  Hypercholesteremia  Hypertension  Panic attacks  Personal history of colonic polyps  Wears dentures   Past Surgical History:   TONSILLECTOMY 1969 (and adenoidectomy)  S/p  partial hysterectomy 1992  COLONOSCOPY 2007 (Darren Wohl: Hyperplastic rectal polyp)  Foot surgery Right 2009  LENS EYE SURGERY Bilateral 2010  COLONOSCOPY 07/02/2011  Basal Cell Carcinoma Removal 08/27/2013 (Nose)  CHOLECYSTECTOMY 04/19/2014  COLONOSCOPY N/A 09/25/2015 (excision of 3 mm hyperplastic polyp. Midge Minium, MD)  CATARACT EXTRACTION  Bilateral (2010, 2011)  mouth surgery    Physical Exam:  Ht:160 cm (5\' 3" ) Wt:76.3 kg (168 lb 3.2 oz) BMI: Body mass index is 29.8 kg/m.  General/Constitutional: No apparent distress: well-nourished and well developed. Patient is anxious to discuss surgical intervention Eyes: Pupils equal, round with synchronous movement. Lymphatic: No palpable adenopathy. Respiratory: Patient has good chest wall movement with inspiration and expiration. Upon auscultation, patient does not have any wheezes, rhonchi or rails appreciated bilaterally. No crackles appreciated at the bases of the lungs Cardiovascular: Patient has a regular rate and rhythm without any appreciable murmurs, heaves, rubs or gallops. Distal posterior tibial pulses appreciated (2+). No edema, swelling or tenderness, except as noted in detailed exam. Integumentary: No impressive skin lesions present, except as noted in detailed exam. Neuro/Psych: Normal mood and affect, slightly anxious, oriented to person, place and time. Musculoskeletal: See left hand exam below  Left hand exam: Upon inspection of the patient's left hand there does not appear to be any skin changes, malalignments or gross deformities appreciated. With palpation the patient has noticeable discomfort over the first [facility] joint. Denies having any other pain or issues anywhere else. Patient is able to abduct and adduct as well as flex and extend her thumb with good strength and range of motion. Is able to touch all 4 fingers utilizing her thumb with mild discomfort. Patient is neurovascularly intact. Has good sensation with palpation across all fingers, distal and proximal to her first [facility] joint as well as the distal portion of her thumb. Radial pulses appreciated 2+, cap refill of all fingers less than 3 seconds.  Imaging: No new imaging was ordered today. Patient has previous images of her left thumb ordered on 04/18/2023. These images were reviewed and showed severe CMC joint  arthritis of her first digit (thumb).  Assessment: Primary osteoarthritis of first carpometacarpal joint of left hand.    Plan: The treatment options were discussed with the patient. In addition, patient educational materials were provided regarding the diagnosis and treatment options. The patient is quite frustrated by her symptoms and function limitations, and is ready to consider more aggressive treatment options. Therefore, I have recommended a surgical procedure, specifically a suspension arthroplasty of the left thumb CMC joint. The procedure was discussed with the patient, as were the potential risks (including bleeding, infection, nerve and/or blood vessel injury, persistent or recurrent pain, stiffness of the thumb, weakness of grip, need for further surgery, blood clots, strokes, heart attacks and/or arhythmias, pneumonia, etc.) and benefits. The patient states her understanding and wishes to proceed. All of the patient's questions and concerns were answered. She can call any time with further concerns. She will follow up post-surgery, routine.    H&P reviewed and patient re-examined. No changes.

## 2023-05-25 NOTE — Transfer of Care (Signed)
Immediate Anesthesia Transfer of Care Note  Patient: Angel Riddle  Procedure(s) Performed: SUSPENSION ARTHROPLASTY OF LEFT THUMB CMC JOINT (Left: Thumb)  Patient Location: PACU  Anesthesia Type:General  Level of Consciousness: awake, alert , and oriented  Airway & Oxygen Therapy: Patient Spontanous Breathing and Patient connected to face mask oxygen  Post-op Assessment: Report given to RN and Post -op Vital signs reviewed and stable  Post vital signs: Reviewed and stable  Last Vitals:  Vitals Value Taken Time  BP 142/89 05/25/23 1218  Temp 35.9 1218  Pulse 78 1218  Resp 15 05/25/23 1221  SpO2 98 1218  Vitals shown include unvalidated device data.  Last Pain:  Vitals:   05/25/23 0936  TempSrc: Temporal  PainSc: 0-No pain         Complications: No notable events documented.

## 2023-05-25 NOTE — Op Note (Signed)
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  Post-Op Diagnosis:   Same.  Procedure:   Suspension arthroplasty left thumb CMC joint.   Surgeon:   Maryagnes Amos, MD  Assistant:   None  Anesthesia:   General LMA  Findings:   As above.  Complications:   None  EBL:   0 cc  Fluids:   500 cc crystalloid  TT:   54 minutes at 250 mmHg  Drains:   None  Closure:   3-0 Vicryl subcuticular sutures  Implants:   Arthrex 1.1 mm CMC Mini Tightrope.  Brief Clinical Note:   The patient is a 67 year old female with a long history of progressively worsening basilar left thumb pain. The patient's symptoms have progressed despite medications, activity modification, injections, splinting, etc. The patient's history and examination are consistent with advanced degenerative joint disease of the left thumb CMC joint which was confirmed by plain radiographs. The patient presents at this time for a suspension arthroplasty of the left thumb CMC joint.  Procedure:    The patient was brought into the operating room and lain in the supine position. After adequate general laryngeal mask anesthesia was obtained, the patient's left hand and upper extremity were prepped with ChloraPrep solution before being draped sterilely. Preoperative antibiotics were administered. A timeout was performed to verify the appropriate surgical site before the limb was exsanguinated with an Esmarch and the tourniquet inflated to 250 mmHg.   An approximately 4-5 cm longitudinal incision was made centered over the radial aspect of the thumb CMC joint. The incision was carried down through the subcutaneous cutaneous tissues with care taken to identify and protect the local neurovascular structures. Several small veins were cauterized with bipolar electrocautery. A longitudinal incision was made through the capsular tissues over the dorsal aspect of the  thumb CMC joint. Subperiosteal dissection was carried out to expose the trapezium dorsally and volarly. Once the [facility] and scaphotrapezial joints were identified, a sagittal cut was made in the trapezium using the micro-oscillating saw. This cut extended approximately two-thirds down through the bone. A Hoke osteotome was used to complete transection of the bone. The bone was then removed piecemeal using rongeurs and further dissection. The flexor carpi radialis tendon was identified at the base of the defect. The base of the thumb metacarpal was prepared by exposing its radial base.  The Arthrex 1.1 mm guidewire was drilled up through the proximal end of the thumb metacarpal beginning at the dorsoradial surface and advancing into the volar radial aspect of the proximal index metacarpal shaft. The adequacy of pin position was verified using FluoroScan imaging in AP, lateral, and oblique projections and found to be excellent.  An approximate 1.5 cm incision was made over the dorsal aspect of the proximal end of the index metacarpal. The incision was carried down through the subcutaneous tissues with care taken to avoid damaging the extensor tendon. The interosseous muscle was dissected away from the dorso-ulnar aspect of the proximal index metacarpal shaft before the guidewire was advanced through the final cortex into this wound. The suture was passed through the nitinol wire loop and pulled across the thumb and index metacarpals, seating the mini-Endobutton against the proximal end of the thumb metacarpal. The suture was cut and each and passed through the second mini-Endobutton. The Endobutton was advanced to rest against the dorso-ulnar aspect of the proximal index metacarpal shaft before it  was tied securely with appropriate tension to stabilize the base of the thumb metacarpal against the base of the index metacarpal. Again, the adequacy of Endobutton position and metacarpal reduction was verified  fluoroscopically in AP, lateral, and oblique projections and found to be excellent. The thumb was then placed through a range of motion. It was able to be abducted and adducted fully and remained distracted to gentle axial loading.  The wounds were copiously irrigated with sterile saline solution using bulb irrigation. The thumb CMC capsular tissues were reapproximated using 2-0 Vicryl interrupted sutures before the skin was closed using 3-0 Vicryl subcuticular interrupted sutures. Benzoin and Steri-Strips were applied to the skin. The skin of the more dorsal incision also was closed using a single 3-0 Vicryl subcuticular suture before a Steri-Strip with benzoin was applied over the wound. A total of 10 cc of 0.5% plain Sensorcaine was injected in and around both incisions to help with postoperative analgesia. A sterile bulky dressing was applied to the wounds before the patient was placed into a fiberglass thumb spica splint maintaining the thumb in the position of function. The patient was then awakened, extubated, and returned to the recovery room in satisfactory condition after tolerating the procedure well.

## 2023-05-26 ENCOUNTER — Encounter: Payer: Self-pay | Admitting: Surgery

## 2023-05-27 NOTE — Anesthesia Postprocedure Evaluation (Signed)
Anesthesia Post Note  Patient: Angel Riddle  Procedure(s) Performed: SUSPENSION ARTHROPLASTY OF LEFT THUMB CMC JOINT (Left: Thumb)  Patient location during evaluation: PACU Anesthesia Type: General Level of consciousness: awake and alert Pain management: pain level controlled Vital Signs Assessment: post-procedure vital signs reviewed and stable Respiratory status: spontaneous breathing, nonlabored ventilation, respiratory function stable and patient connected to nasal cannula oxygen Cardiovascular status: blood pressure returned to baseline and stable Postop Assessment: no apparent nausea or vomiting Anesthetic complications: no   No notable events documented.   Last Vitals:  Vitals:   05/25/23 1330 05/25/23 1358  BP: (!) 120/56 139/69  Pulse: 77   Resp: 18 18  Temp: 36.4 C (!) 36.3 C  SpO2: 98% 95%    Last Pain:  Vitals:   05/25/23 1358  TempSrc: Temporal  PainSc: 4                  Yevette Edwards

## 2023-05-30 ENCOUNTER — Other Ambulatory Visit: Payer: Self-pay

## 2023-05-31 DIAGNOSIS — M48062 Spinal stenosis, lumbar region with neurogenic claudication: Secondary | ICD-10-CM | POA: Diagnosis not present

## 2023-05-31 DIAGNOSIS — M5416 Radiculopathy, lumbar region: Secondary | ICD-10-CM | POA: Diagnosis not present

## 2023-06-06 ENCOUNTER — Ambulatory Visit: Payer: Medicare Other | Admitting: Physician Assistant

## 2023-06-06 ENCOUNTER — Other Ambulatory Visit: Payer: Self-pay | Admitting: Internal Medicine

## 2023-06-06 DIAGNOSIS — M1812 Unilateral primary osteoarthritis of first carpometacarpal joint, left hand: Secondary | ICD-10-CM | POA: Diagnosis not present

## 2023-06-08 ENCOUNTER — Telehealth: Payer: Self-pay

## 2023-06-13 ENCOUNTER — Encounter: Payer: Self-pay | Admitting: Internal Medicine

## 2023-06-13 ENCOUNTER — Ambulatory Visit (INDEPENDENT_AMBULATORY_CARE_PROVIDER_SITE_OTHER): Payer: Medicare Other | Admitting: Internal Medicine

## 2023-06-13 VITALS — BP 122/88 | HR 87 | Temp 99.1°F | Ht 65.0 in | Wt 171.0 lb

## 2023-06-13 DIAGNOSIS — I10 Essential (primary) hypertension: Secondary | ICD-10-CM | POA: Diagnosis not present

## 2023-06-13 DIAGNOSIS — M48062 Spinal stenosis, lumbar region with neurogenic claudication: Secondary | ICD-10-CM

## 2023-06-13 DIAGNOSIS — E7800 Pure hypercholesterolemia, unspecified: Secondary | ICD-10-CM

## 2023-06-13 DIAGNOSIS — I700 Atherosclerosis of aorta: Secondary | ICD-10-CM | POA: Diagnosis not present

## 2023-06-13 DIAGNOSIS — M79646 Pain in unspecified finger(s): Secondary | ICD-10-CM

## 2023-06-13 DIAGNOSIS — K219 Gastro-esophageal reflux disease without esophagitis: Secondary | ICD-10-CM

## 2023-06-13 DIAGNOSIS — R198 Other specified symptoms and signs involving the digestive system and abdomen: Secondary | ICD-10-CM | POA: Diagnosis not present

## 2023-06-13 DIAGNOSIS — R300 Dysuria: Secondary | ICD-10-CM | POA: Diagnosis not present

## 2023-06-13 DIAGNOSIS — R739 Hyperglycemia, unspecified: Secondary | ICD-10-CM | POA: Diagnosis not present

## 2023-06-13 LAB — URINALYSIS, ROUTINE W REFLEX MICROSCOPIC
Bilirubin Urine: NEGATIVE
Ketones, ur: NEGATIVE
Nitrite: POSITIVE — AB
Specific Gravity, Urine: 1.02 (ref 1.000–1.030)
Total Protein, Urine: NEGATIVE
Urine Glucose: NEGATIVE
Urobilinogen, UA: 0.2 (ref 0.0–1.0)
pH: 6.5 (ref 5.0–8.0)

## 2023-06-13 MED ORDER — POTASSIUM CHLORIDE ER 10 MEQ PO TBCR: 10.0000 meq | EXTENDED_RELEASE_TABLET | Freq: Every day | ORAL | 1 refills | Status: DC

## 2023-06-13 MED ORDER — PANTOPRAZOLE SODIUM 40 MG PO TBEC: 40.0000 mg | DELAYED_RELEASE_TABLET | Freq: Every day | ORAL | 5 refills | Status: DC

## 2023-06-13 NOTE — Progress Notes (Signed)
Subjective:    Patient ID: Angel Riddle, female    DOB: 09/28/1956, 67 y.o.   MRN: [id number]  Patient here for  Chief Complaint  Patient presents with   Medical Management of Chronic Issues    Discuss GI referral     HPI Here to discuss GI issues.  Noticed change in stool.  Will have urgency - stool.  Has had episodes where had urgency and did not make it to the bathroom.  Mostly has solid stool.  Discussed diet.  Avoid artificial sweeteners. Benefiber.  No chest pain or sob reported.  S/p suspension arthroplasty of left thumb CMC joint (05/25/23). Due to f/u with podiatry this week - toe/foot pain.    Past Medical History:  Diagnosis Date   Allergy    Anal fissure    Anxiety    a.) on BZO (alprazolam) PRN   Aortic atherosclerosis (HCC)    Arthritis    B12 deficiency    Basal cell carcinoma (BCC) of skin of nose    Chronic back pain    Degenerative disc disease, lumbar    Depression    DOE (dyspnea on exertion)    Female bladder prolapse    Gallstones    GERD (gastroesophageal reflux disease)    Heart murmur    Hx of colonic polyp    Hx: UTI (urinary tract infection)    Hypercholesterolemia    Hypertension    Long term current use of aspirin    Lumbar stenosis    Panic attacks    Wears partial dentures    Past Surgical History:  Procedure Laterality Date   ABDOMINAL HYSTERECTOMY  1992   partial   CARPOMETACARPAL (CMC) FUSION OF THUMB Left 05/25/2023   Procedure: SUSPENSION ARTHROPLASTY OF LEFT THUMB CMC JOINT;  Surgeon: Christena Flake, MD;  Location: [facility];  Service: Orthopedics;  Laterality: Left;   CATARACT EXTRACTION Bilateral [id number]   CHOLECYSTECTOMY  04/19/2014   COLONOSCOPY  07/02/2011   DR. Byrnett   COLONOSCOPY WITH PROPOFOL N/A 09/25/2015   Procedure: COLONOSCOPY WITH PROPOFOL;  Surgeon: Midge Minium, MD;  Location: [facility];  Service: Endoscopy;  Laterality: N/A;   FOOT SURGERY Right 2009   LUMBAR FUSION  2018   MOUTH SURGERY      dental implants, veneers   POLYPECTOMY  09/25/2015   Procedure: POLYPECTOMY;  Surgeon: Midge Minium, MD;  Location: [facility];  Service: Endoscopy;;   skin surgery Left 08/27/2013   Basal cell removal-left nostril   TONSILLECTOMY AND ADENOIDECTOMY  1969   Family History  Problem Relation Age of Onset   Hyperlipidemia Mother    Hypertension Mother    Alcohol abuse Father    Hyperlipidemia Father    Heart disease Father    Diabetes Father    Hyperlipidemia Sister    Lung cancer Brother    Hyperlipidemia Brother    Breast cancer Neg Hx    Social History   Socioeconomic History   Marital status: Widowed    Spouse name: Not on file   Number of children: 2   Years of education: Not on file   Highest education level: Not on file  Occupational History   Not on file  Tobacco Use   Smoking status: Every Day    Current packs/day: 0.00    Average packs/day: 1 pack/day for 37.0 years (37.0 ttl pk-yrs)    Types: Cigarettes    Start date: 08/07/2019    Last attempt to quit:  05/25/2021    Years since quitting: 2.0   Smokeless tobacco: Never   Tobacco comments:    Smokes 1/2 PPD   Vaping Use   Vaping status: Former  Substance and Sexual Activity   Alcohol use: Yes    Alcohol/week: 1.0 standard drink of alcohol    Types: 1 Glasses of wine per week    Comment: occassional   Drug use: No   Sexual activity: Not on file  Other Topics Concern   Not on file  Social History Narrative   Lives alone   Social Determinants of Health   Financial Resource Strain: Low Risk  (03/28/2023)   Overall Financial Resource Strain (CARDIA)    Difficulty of Paying Living Expenses: Not very hard  Food Insecurity: No Food Insecurity (03/28/2023)   Hunger Vital Sign    Worried About Running Out of Food in the Last Year: Never true    Ran Out of Food in the Last Year: Never true  Transportation Needs: No Transportation Needs (03/28/2023)   PRAPARE - Administrator, Civil Service  (Medical): No    Lack of Transportation (Non-Medical): No  Physical Activity: Not on file  Stress: No Stress Concern Present (03/28/2023)   [employer] of Occupational Health - Occupational Stress Questionnaire    Feeling of Stress : Not at all  Social Connections: Moderately Integrated (03/28/2023)   Social Connection and Isolation Panel [NHANES]    Frequency of Communication with Friends and Family: More than three times a week    Frequency of Social Gatherings with Friends and Family: More than three times a week    Attends Religious Services: More than 4 times per year    Active Member of [employer] or Organizations: Not on file    Attends Banker Meetings: More than 4 times per year    Marital Status: Widowed     Review of Systems  Constitutional:  Negative for appetite change and unexpected weight change.  HENT:  Negative for congestion and sinus pressure.   Respiratory:  Negative for cough, chest tightness and shortness of breath.   Cardiovascular:  Negative for chest pain and palpitations.  Gastrointestinal:  Negative for nausea and vomiting.       Bowel urgency as outlined.   Genitourinary:  Negative for difficulty urinating and dysuria.  Musculoskeletal:  Negative for joint swelling and myalgias.  Skin:  Negative for color change and rash.  Neurological:  Negative for dizziness and headaches.  Psychiatric/Behavioral:  Negative for agitation and dysphoric mood.        Objective:     BP 122/88   Pulse 87   Temp 99.1 F (37.3 C) (Oral)   Ht 5\' 5"  (1.651 m)   Wt 171 lb (77.6 kg)   SpO2 95%   BMI 28.46 kg/m  Wt Readings from Last 3 Encounters:  06/13/23 171 lb (77.6 kg)  05/25/23 164 lb 0.4 oz (74.4 kg)  05/16/23 164 lb (74.4 kg)    Physical Exam Vitals reviewed.  Constitutional:      General: She is not in acute distress.    Appearance: Normal appearance.  HENT:     Head: Normocephalic and atraumatic.     Right Ear: External ear normal.      Left Ear: External ear normal.  Eyes:     General: No scleral icterus.       Right eye: No discharge.        Left eye: No discharge.  Conjunctiva/sclera: Conjunctivae normal.  Neck:     Thyroid: No thyromegaly.  Cardiovascular:     Rate and Rhythm: Normal rate and regular rhythm.  Pulmonary:     Effort: No respiratory distress.     Breath sounds: Normal breath sounds. No wheezing.  Abdominal:     General: Bowel sounds are normal.     Palpations: Abdomen is soft.     Tenderness: There is no abdominal tenderness.  Musculoskeletal:        General: No swelling or tenderness.     Cervical back: Neck supple. No tenderness.  Lymphadenopathy:     Cervical: No cervical adenopathy.  Skin:    Findings: No erythema or rash.  Neurological:     Mental Status: She is alert.  Psychiatric:        Mood and Affect: Mood normal.        Behavior: Behavior normal.      Outpatient Encounter Medications as of 06/13/2023  Medication Sig   albuterol (VENTOLIN HFA) 108 (90 Base) MCG/ACT inhaler Inhale 2 puffs into the lungs every 6 (six) hours as needed for wheezing or shortness of breath.   Alirocumab (PRALUENT) 75 MG/ML SOAJ Inject 1 pen (75 mg) into the skin every 14 days.   ALPRAZolam (XANAX) 0.5 MG tablet Take 1 tablet (0.5 mg total) by mouth daily as needed for anxiety.   aspirin EC 81 MG tablet Take 1 tablet (81 mg total) by mouth daily. Swallow whole.   DULoxetine (CYMBALTA) 60 MG capsule Take 1 capsule (60 mg total) by mouth daily.   FLUoxetine (PROZAC) 40 MG capsule Take 1 capsule (40 mg total) by mouth daily.   hydrochlorothiazide (MICROZIDE) 12.5 MG capsule Take 1 capsule (12.5 mg total) by mouth daily.   HYDROcodone-acetaminophen (NORCO) 7.5-325 MG tablet Take 1-2 tablets by mouth every 6 (six) hours as needed. Using 1-2 times daily   meloxicam (MOBIC) 15 MG tablet Take 1 tablet (15 mg total) by mouth daily.   metoprolol tartrate (LOPRESSOR) 25 MG tablet Take 1 tablet (25 mg total) by  mouth daily.   pregabalin (LYRICA) 75 MG capsule Take 75 mg by mouth 2 (two) times daily.   [DISCONTINUED] pantoprazole (PROTONIX) 40 MG tablet Take 1 tablet (40 mg total) by mouth daily.   [DISCONTINUED] potassium chloride (KLOR-CON) 10 MEQ tablet Take 1 tablet (10 mEq total) by mouth daily.   pantoprazole (PROTONIX) 40 MG tablet Take 1 tablet (40 mg total) by mouth daily.   potassium chloride (KLOR-CON) 10 MEQ tablet Take 1 tablet (10 mEq total) by mouth daily.   No facility-administered encounter medications on file as of 06/13/2023.     Lab Results  Component Value Date   WBC 8.5 03/29/2023   HGB 14.4 03/29/2023   HCT 41.7 03/29/2023   PLT 348.0 03/29/2023   GLUCOSE 98 05/16/2023   CHOL 233 (H) 03/25/2023   TRIG 296.0 (H) 03/25/2023   HDL 48.60 03/25/2023   LDLDIRECT 150.0 03/25/2023   LDLCALC 97 04/05/2022   ALT 8 03/25/2023   AST 19 03/25/2023   NA 139 05/16/2023   K 3.8 05/16/2023   CL 104 05/16/2023   CREATININE 0.91 05/16/2023   BUN 16 05/16/2023   CO2 24 05/16/2023   TSH 1.93 12/23/2022   HGBA1C 6.1 03/25/2023    DG MINI C-ARM IMAGE ONLY  Result Date: 05/25/2023 There is no interpretation for this exam.  This order is for images obtained during a surgical procedure.  Please See "Surgeries" Tab for  more information regarding the procedure.       Assessment & Plan:  Dysuria -     Urinalysis, Routine w reflex microscopic -     Urine Culture  Essential hypertension -     Potassium Chloride ER; Take 1 tablet (10 mEq total) by mouth daily.  Dispense: 90 tablet; Refill: 1  Change in bowel movement Assessment & Plan: Discussed bowel changes.  Discussed stopping artificial sweeteners.  Benefiber.  Request GI referral.  Last colonoscopy 2016.    Aortic atherosclerosis (HCC) Assessment & Plan: Continue praluent.    Gastroesophageal reflux disease, unspecified whether esophagitis present Assessment & Plan: No upper symptoms reported.  Continue prilosec.      Hypercholesterolemia Assessment & Plan: Low cholesterol diet and exercise.  Continue praluent.  Follow lipid panel and liver function tests.  Lab Results  Component Value Date   CHOL 233 (H) 03/25/2023   HDL 48.60 03/25/2023   LDLCALC 97 04/05/2022   LDLDIRECT 150.0 03/25/2023   TRIG 296.0 (H) 03/25/2023   CHOLHDL 5 03/25/2023     Hyperglycemia Assessment & Plan: Low carb diet and exercise.  Follow met b and a1c.  Lab Results  Component Value Date   HGBA1C 6.1 03/25/2023     Primary hypertension Assessment & Plan: Continue hctz and metoprolol.  Recheck wnl. Follow pressures.  Follow metabolic panel.    Lumbar stenosis with neurogenic claudication Assessment & Plan: Continues f/u with Dr Lovell Sheehan.    Thumb pain, unspecified laterality Assessment & Plan:  S/p suspension arthroplasty of left thumb CMC joint (05/25/23). Continue f/u with ortho.    Other orders -     Pantoprazole Sodium; Take 1 tablet (40 mg total) by mouth daily.  Dispense: 30 tablet; Refill: 5     Dale Plaquemines, MD

## 2023-06-14 ENCOUNTER — Other Ambulatory Visit: Payer: Self-pay

## 2023-06-14 LAB — URINE CULTURE: MICRO NUMBER:: 15170542

## 2023-06-14 MED ORDER — CEFDINIR 300 MG PO CAPS: 300.0000 mg | ORAL_CAPSULE | Freq: Two times a day (BID) | ORAL | 0 refills | Status: DC

## 2023-06-14 NOTE — Telephone Encounter (Signed)
Abs sent in per result note message.

## 2023-06-16 DIAGNOSIS — M79672 Pain in left foot: Secondary | ICD-10-CM | POA: Diagnosis not present

## 2023-06-16 DIAGNOSIS — M67472 Ganglion, left ankle and foot: Secondary | ICD-10-CM | POA: Diagnosis not present

## 2023-06-16 DIAGNOSIS — D489 Neoplasm of uncertain behavior, unspecified: Secondary | ICD-10-CM | POA: Diagnosis not present

## 2023-06-16 LAB — URINE CULTURE: SPECIMEN QUALITY:: ADEQUATE

## 2023-06-19 ENCOUNTER — Encounter: Payer: Self-pay | Admitting: Internal Medicine

## 2023-06-19 DIAGNOSIS — R198 Other specified symptoms and signs involving the digestive system and abdomen: Secondary | ICD-10-CM | POA: Insufficient documentation

## 2023-06-19 NOTE — Assessment & Plan Note (Signed)
Continue praluent. 

## 2023-06-19 NOTE — Assessment & Plan Note (Signed)
No upper symptoms reported.  Continue prilosec.  

## 2023-06-19 NOTE — Assessment & Plan Note (Signed)
Low carb diet and exercise.  Follow met b and a1c.  Lab Results  Component Value Date   HGBA1C 6.1 03/25/2023   

## 2023-06-19 NOTE — Assessment & Plan Note (Signed)
Continue hctz and metoprolol.  Recheck wnl. Follow pressures.  Follow metabolic panel.  

## 2023-06-19 NOTE — Assessment & Plan Note (Signed)
S/p suspension arthroplasty of left thumb CMC joint (05/25/23). Continue f/u with ortho.

## 2023-06-19 NOTE — Assessment & Plan Note (Signed)
Low cholesterol diet and exercise.  Continue praluent.  Follow lipid panel and liver function tests.  Lab Results  Component Value Date   CHOL 233 (H) 03/25/2023   HDL 48.60 03/25/2023   LDLCALC 97 04/05/2022   LDLDIRECT 150.0 03/25/2023   TRIG 296.0 (H) 03/25/2023   CHOLHDL 5 03/25/2023   

## 2023-06-19 NOTE — Assessment & Plan Note (Signed)
Continues f/u with Dr Lovell Sheehan.

## 2023-06-19 NOTE — Assessment & Plan Note (Addendum)
Discussed bowel changes.  Discussed stopping artificial sweeteners.  Benefiber.  Request GI referral.  Last colonoscopy 2016.

## 2023-06-20 ENCOUNTER — Telehealth: Payer: Self-pay | Admitting: Internal Medicine

## 2023-06-20 DIAGNOSIS — M1812 Unilateral primary osteoarthritis of first carpometacarpal joint, left hand: Secondary | ICD-10-CM | POA: Diagnosis not present

## 2023-06-20 DIAGNOSIS — R198 Other specified symptoms and signs involving the digestive system and abdomen: Secondary | ICD-10-CM

## 2023-06-20 NOTE — Telephone Encounter (Signed)
 Order placed for GI referral.   

## 2023-06-20 NOTE — Addendum Note (Signed)
Addended by: Charm Barges on: 06/20/2023 09:22 PM   Modules accepted: Orders

## 2023-06-20 NOTE — Telephone Encounter (Signed)
Saw her recently with GI issues.  Discussed recommendations to help with symptoms.  Please confirm which GI MD she prefers to see.

## 2023-06-20 NOTE — Telephone Encounter (Signed)
Patient is requesting to see Angel Riddle at [facility]. Ok to place referral for her?

## 2023-06-21 DIAGNOSIS — M5416 Radiculopathy, lumbar region: Secondary | ICD-10-CM | POA: Diagnosis not present

## 2023-06-21 DIAGNOSIS — M48062 Spinal stenosis, lumbar region with neurogenic claudication: Secondary | ICD-10-CM | POA: Diagnosis not present

## 2023-06-21 DIAGNOSIS — M5136 Other intervertebral disc degeneration, lumbar region: Secondary | ICD-10-CM | POA: Diagnosis not present

## 2023-06-21 NOTE — Telephone Encounter (Signed)
 Noted  

## 2023-06-22 DIAGNOSIS — M1811 Unilateral primary osteoarthritis of first carpometacarpal joint, right hand: Secondary | ICD-10-CM | POA: Diagnosis not present

## 2023-06-23 ENCOUNTER — Other Ambulatory Visit (INDEPENDENT_AMBULATORY_CARE_PROVIDER_SITE_OTHER): Payer: Medicare Other

## 2023-06-23 DIAGNOSIS — I10 Essential (primary) hypertension: Secondary | ICD-10-CM | POA: Diagnosis not present

## 2023-06-23 DIAGNOSIS — E7800 Pure hypercholesterolemia, unspecified: Secondary | ICD-10-CM

## 2023-06-23 DIAGNOSIS — R739 Hyperglycemia, unspecified: Secondary | ICD-10-CM | POA: Diagnosis not present

## 2023-06-23 LAB — BASIC METABOLIC PANEL
BUN: 19 mg/dL (ref 6–23)
CO2: 27 mEq/L (ref 19–32)
Calcium: 9.6 mg/dL (ref 8.4–10.5)
Chloride: 104 mEq/L (ref 96–112)
Creatinine, Ser: 0.89 mg/dL (ref 0.40–1.20)
GFR: 67.11 mL/min (ref >60.00)
Glucose, Bld: 87 mg/dL (ref 70–99)
Potassium: 3.7 mEq/L (ref 3.5–5.1)
Sodium: 139 mEq/L (ref 135–145)

## 2023-06-23 LAB — HEPATIC FUNCTION PANEL
ALT: 8 U/L (ref 0–35)
AST: 18 U/L (ref 0–37)
Albumin: 4.2 g/dL (ref 3.5–5.2)
Alkaline Phosphatase: 99 U/L (ref 39–117)
Bilirubin, Direct: 0.1 mg/dL (ref 0.0–0.3)
Total Bilirubin: 0.5 mg/dL (ref 0.2–1.2)
Total Protein: 7.3 g/dL (ref 6.0–8.3)

## 2023-06-23 LAB — LIPID PANEL
Cholesterol: 242 mg/dL — ABNORMAL HIGH (ref 0–200)
HDL: 60.4 mg/dL (ref >39.00)
LDL Cholesterol: 149 mg/dL — ABNORMAL HIGH (ref 0–99)
NonHDL: 181.49
Total CHOL/HDL Ratio: 4
Triglycerides: 164 mg/dL — ABNORMAL HIGH (ref 0.0–149.0)
VLDL: 32.8 mg/dL (ref 0.0–40.0)

## 2023-06-23 LAB — HEMOGLOBIN A1C: Hgb A1c MFr Bld: 6 % (ref 4.6–6.5)

## 2023-06-28 ENCOUNTER — Encounter: Payer: Self-pay | Admitting: Internal Medicine

## 2023-06-28 ENCOUNTER — Ambulatory Visit (INDEPENDENT_AMBULATORY_CARE_PROVIDER_SITE_OTHER): Payer: Medicare Other | Admitting: Internal Medicine

## 2023-06-28 VITALS — BP 118/70 | HR 86 | Temp 97.9°F | Resp 16 | Ht 65.0 in | Wt 177.0 lb

## 2023-06-28 DIAGNOSIS — I700 Atherosclerosis of aorta: Secondary | ICD-10-CM

## 2023-06-28 DIAGNOSIS — Z87891 Personal history of nicotine dependence: Secondary | ICD-10-CM

## 2023-06-28 DIAGNOSIS — I10 Essential (primary) hypertension: Secondary | ICD-10-CM

## 2023-06-28 DIAGNOSIS — M48062 Spinal stenosis, lumbar region with neurogenic claudication: Secondary | ICD-10-CM | POA: Diagnosis not present

## 2023-06-28 DIAGNOSIS — F320 Major depressive disorder, single episode, mild: Secondary | ICD-10-CM | POA: Diagnosis not present

## 2023-06-28 DIAGNOSIS — Z86010 Personal history of colonic polyps: Secondary | ICD-10-CM | POA: Diagnosis not present

## 2023-06-28 DIAGNOSIS — E7800 Pure hypercholesterolemia, unspecified: Secondary | ICD-10-CM

## 2023-06-28 DIAGNOSIS — K219 Gastro-esophageal reflux disease without esophagitis: Secondary | ICD-10-CM

## 2023-06-28 DIAGNOSIS — F419 Anxiety disorder, unspecified: Secondary | ICD-10-CM

## 2023-06-28 DIAGNOSIS — R739 Hyperglycemia, unspecified: Secondary | ICD-10-CM | POA: Diagnosis not present

## 2023-06-28 MED ORDER — HYDROCHLOROTHIAZIDE 12.5 MG PO CAPS: 12.5000 mg | ORAL_CAPSULE | Freq: Every day | ORAL | 1 refills | Status: DC

## 2023-06-28 MED ORDER — ALPRAZOLAM 0.5 MG PO TABS: 0.5000 mg | ORAL_TABLET | Freq: Every day | ORAL | 0 refills | Status: DC | PRN

## 2023-06-28 MED ORDER — DULOXETINE HCL 60 MG PO CPEP: 60.0000 mg | ORAL_CAPSULE | Freq: Every day | ORAL | 2 refills | Status: DC

## 2023-06-28 MED ORDER — FLUOXETINE HCL 40 MG PO CAPS: ORAL_CAPSULE | ORAL | 1 refills | Status: DC

## 2023-06-28 NOTE — Assessment & Plan Note (Addendum)
The 10-year ASCVD risk score (Arnett DK, et al., 2019) is: 14.2%   Values used to calculate the score:     Age: 67 years     Sex: Female     Is Non-Hispanic African American: No     Diabetic: No     Tobacco smoker: Yes     Systolic Blood Pressure: 118 mmHg     Is BP treated: Yes     HDL Cholesterol: 60.4 mg/dL     Total Cholesterol: 242 mg/dL  Continues praluent.

## 2023-06-28 NOTE — Progress Notes (Signed)
Subjective:    Patient ID: Angel Riddle, female    DOB: 1956-02-15, 67 y.o.   MRN: [id number]  Patient here for  Chief Complaint  Patient presents with   Medical Management of Chronic Issues    HPI Here for a scheduled follow up.  Follow up regarding hypertension and hypercholesterolemia.  Recently evaluated for bowel change.  Discussed avoidance of artificial sweeteners.  Also instructed to take benefiber. Has cut down on artificial sweeteners. Has significantly decreased intake of diet ginger ale.  Is s/p left thumb CMC arthroplasty. Left thumb doing better.  Increased pain - right thumb now. S/p injection - ortho 06/22/23.  Seeing physiatry for back pain. Planning injection 07/13/23. Out of routine - with exercise.  No chest pain or sob reported.  Handling stress.  Overall appears to be doing better.    Past Medical History:  Diagnosis Date   Allergy    Anal fissure    Anxiety    a.) on BZO (alprazolam) PRN   Aortic atherosclerosis (HCC)    Arthritis    B12 deficiency    Basal cell carcinoma (BCC) of skin of nose    Chronic back pain    Degenerative disc disease, lumbar    Depression    DOE (dyspnea on exertion)    Female bladder prolapse    Gallstones    GERD (gastroesophageal reflux disease)    Heart murmur    Hx of colonic polyp    Hx: UTI (urinary tract infection)    Hypercholesterolemia    Hypertension    Long term current use of aspirin    Lumbar stenosis    Panic attacks    Wears partial dentures    Past Surgical History:  Procedure Laterality Date   ABDOMINAL HYSTERECTOMY  1992   partial   CARPOMETACARPAL (CMC) FUSION OF THUMB Left 05/25/2023   Procedure: SUSPENSION ARTHROPLASTY OF LEFT THUMB CMC JOINT;  Surgeon: Christena Flake, MD;  Location: [facility];  Service: Orthopedics;  Laterality: Left;   CATARACT EXTRACTION Bilateral [id number]   CHOLECYSTECTOMY  04/19/2014   COLONOSCOPY  07/02/2011   DR. Byrnett   COLONOSCOPY WITH PROPOFOL N/A 09/25/2015    Procedure: COLONOSCOPY WITH PROPOFOL;  Surgeon: Midge Minium, MD;  Location: [facility];  Service: Endoscopy;  Laterality: N/A;   FOOT SURGERY Right 2009   LUMBAR FUSION  2018   MOUTH SURGERY     dental implants, veneers   POLYPECTOMY  09/25/2015   Procedure: POLYPECTOMY;  Surgeon: Midge Minium, MD;  Location: [facility];  Service: Endoscopy;;   skin surgery Left 08/27/2013   Basal cell removal-left nostril   TONSILLECTOMY AND ADENOIDECTOMY  1969   Family History  Problem Relation Age of Onset   Hyperlipidemia Mother    Hypertension Mother    Alcohol abuse Father    Hyperlipidemia Father    Heart disease Father    Diabetes Father    Hyperlipidemia Sister    Lung cancer Brother    Hyperlipidemia Brother    Breast cancer Neg Hx    Social History   Socioeconomic History   Marital status: Widowed    Spouse name: Not on file   Number of children: 2   Years of education: Not on file   Highest education level: Some college, no degree  Occupational History   Not on file  Tobacco Use   Smoking status: Every Day    Current packs/day: 0.00    Average packs/day: 1 pack/day for  37.0 years (37.0 ttl pk-yrs)    Types: Cigarettes    Start date: 08/07/2019    Last attempt to quit: 05/25/2021    Years since quitting: 2.1   Smokeless tobacco: Never   Tobacco comments:    Smokes 1/2 PPD   Vaping Use   Vaping status: Former  Substance and Sexual Activity   Alcohol use: Yes    Alcohol/week: 1.0 standard drink of alcohol    Types: 1 Glasses of wine per week    Comment: occassional   Drug use: No   Sexual activity: Not on file  Other Topics Concern   Not on file  Social History Narrative   Lives alone   Social Determinants of Health   Financial Resource Strain: Low Risk  (06/23/2023)   Overall Financial Resource Strain (CARDIA)    Difficulty of Paying Living Expenses: Not very hard  Food Insecurity: No Food Insecurity (06/23/2023)   Hunger Vital Sign    Worried  About Running Out of Food in the Last Year: Never true    Ran Out of Food in the Last Year: Never true  Transportation Needs: No Transportation Needs (06/23/2023)   PRAPARE - Administrator, Civil Service (Medical): No    Lack of Transportation (Non-Medical): No  Physical Activity: Unknown (06/23/2023)   Exercise Vital Sign    Days of Exercise per Week: 0 days    Minutes of Exercise per Session: Not on file  Stress: No Stress Concern Present (06/23/2023)   [employer] of Occupational Health - Occupational Stress Questionnaire    Feeling of Stress : Only a little  Social Connections: Moderately Integrated (06/23/2023)   Social Connection and Isolation Panel [NHANES]    Frequency of Communication with Friends and Family: More than three times a week    Frequency of Social Gatherings with Friends and Family: Three times a week    Attends Religious Services: More than 4 times per year    Active Member of Clubs or Organizations: Not on file    Attends Club or Organization Meetings: More than 4 times per year    Marital Status: Widowed     Review of Systems  Constitutional:  Negative for appetite change and unexpected weight change.  HENT:  Negative for congestion and sinus pressure.   Respiratory:  Negative for cough, chest tightness and shortness of breath.   Cardiovascular:  Negative for chest pain, palpitations and leg swelling.  Gastrointestinal:  Negative for vomiting.       No increased abdominal pain.   Genitourinary:  Negative for difficulty urinating and dysuria.  Musculoskeletal:  Negative for myalgias.       Chronic back pain.   Skin:  Negative for color change and rash.  Neurological:  Negative for dizziness and headaches.  Psychiatric/Behavioral:  Negative for agitation and dysphoric mood.        Objective:     BP 118/70   Pulse 86   Temp 97.9 F (36.6 C)   Resp 16   Ht 5\' 5"  (1.651 m)   Wt 177 lb (80.3 kg)   SpO2 99%   BMI 29.45 kg/m  Wt  Readings from Last 3 Encounters:  06/28/23 177 lb (80.3 kg)  06/13/23 171 lb (77.6 kg)  05/25/23 164 lb 0.4 oz (74.4 kg)    Physical Exam Vitals reviewed.  Constitutional:      General: She is not in acute distress.    Appearance: Normal appearance.  HENT:  Head: Normocephalic and atraumatic.     Right Ear: External ear normal.     Left Ear: External ear normal.  Eyes:     General: No scleral icterus.       Right eye: No discharge.        Left eye: No discharge.     Conjunctiva/sclera: Conjunctivae normal.  Neck:     Thyroid: No thyromegaly.  Cardiovascular:     Rate and Rhythm: Normal rate and regular rhythm.  Pulmonary:     Effort: No respiratory distress.     Breath sounds: Normal breath sounds. No wheezing.  Abdominal:     General: Bowel sounds are normal.     Palpations: Abdomen is soft.     Tenderness: There is no abdominal tenderness.  Musculoskeletal:        General: No swelling or tenderness.     Cervical back: Neck supple. No tenderness.  Lymphadenopathy:     Cervical: No cervical adenopathy.  Skin:    Findings: No erythema or rash.  Neurological:     Mental Status: She is alert.  Psychiatric:        Mood and Affect: Mood normal.        Behavior: Behavior normal.      Outpatient Encounter Medications as of 06/28/2023  Medication Sig   albuterol (VENTOLIN HFA) 108 (90 Base) MCG/ACT inhaler Inhale 2 puffs into the lungs every 6 (six) hours as needed for wheezing or shortness of breath.   Alirocumab (PRALUENT) 75 MG/ML SOAJ Inject 1 pen (75 mg) into the skin every 14 days.   ALPRAZolam (XANAX) 0.5 MG tablet Take 1 tablet (0.5 mg total) by mouth daily as needed for anxiety.   aspirin EC 81 MG tablet Take 1 tablet (81 mg total) by mouth daily. Swallow whole.   DULoxetine (CYMBALTA) 60 MG capsule Take 1 capsule (60 mg total) by mouth daily.   FLUoxetine (PROZAC) 40 MG capsule Take 1 capsule (40 mg total) by mouth daily.   hydrochlorothiazide (MICROZIDE)  12.5 MG capsule Take 1 capsule (12.5 mg total) by mouth daily.   HYDROcodone-acetaminophen (NORCO) 7.5-325 MG tablet Take 1-2 tablets by mouth every 6 (six) hours as needed. Using 1-2 times daily   meloxicam (MOBIC) 15 MG tablet Take 1 tablet (15 mg total) by mouth daily.   metoprolol tartrate (LOPRESSOR) 25 MG tablet Take 1 tablet (25 mg total) by mouth daily.   pantoprazole (PROTONIX) 40 MG tablet Take 1 tablet (40 mg total) by mouth daily.   potassium chloride (KLOR-CON) 10 MEQ tablet Take 1 tablet (10 mEq total) by mouth daily.   pregabalin (LYRICA) 75 MG capsule Take 75 mg by mouth 2 (two) times daily.   [DISCONTINUED] ALPRAZolam (XANAX) 0.5 MG tablet Take 1 tablet (0.5 mg total) by mouth daily as needed for anxiety.   [DISCONTINUED] cefdinir (OMNICEF) 300 MG capsule Take 1 capsule (300 mg total) by mouth 2 (two) times daily.   [DISCONTINUED] DULoxetine (CYMBALTA) 60 MG capsule Take 1 capsule (60 mg total) by mouth daily.   [DISCONTINUED] FLUoxetine (PROZAC) 40 MG capsule Take 1 capsule (40 mg total) by mouth daily.   [DISCONTINUED] hydrochlorothiazide (MICROZIDE) 12.5 MG capsule Take 1 capsule (12.5 mg total) by mouth daily.   No facility-administered encounter medications on file as of 06/28/2023.     Lab Results  Component Value Date   WBC 8.5 03/29/2023   HGB 14.4 03/29/2023   HCT 41.7 03/29/2023   PLT 348.0 03/29/2023   GLUCOSE 87  06/23/2023   CHOL 242 (H) 06/23/2023   TRIG 164.0 (H) 06/23/2023   HDL 60.40 06/23/2023   LDLDIRECT 150.0 03/25/2023   LDLCALC 149 (H) 06/23/2023   ALT 8 06/23/2023   AST 18 06/23/2023   NA 139 06/23/2023   K 3.7 06/23/2023   CL 104 06/23/2023   CREATININE 0.89 06/23/2023   BUN 19 06/23/2023   CO2 27 06/23/2023   TSH 1.93 12/23/2022   HGBA1C 6.0 06/23/2023    DG MINI C-ARM IMAGE ONLY  Result Date: 05/25/2023 There is no interpretation for this exam.  This order is for images obtained during a surgical procedure.  Please See "Surgeries" Tab  for more information regarding the procedure.       Assessment & Plan:  Aortic atherosclerosis (HCC) Assessment & Plan: Continue praluent.    Essential hypertension -     hydroCHLOROthiazide; Take 1 capsule (12.5 mg total) by mouth daily.  Dispense: 90 capsule; Refill: 1  Hypercholesterolemia Assessment & Plan: The 10-year ASCVD risk score (Arnett DK, et al., 2019) is: 14.2%   Values used to calculate the score:     Age: 58 years     Sex: Female     Is Non-Hispanic African American: No     Diabetic: No     Tobacco smoker: Yes     Systolic Blood Pressure: 118 mmHg     Is BP treated: Yes     HDL Cholesterol: 60.4 mg/dL     Total Cholesterol: 242 mg/dL  Continues praluent.    Depression, major, single episode, mild (HCC) Assessment & Plan: Continues on prozac and cymbalta.  She is limiting xanax usage. Follow.    Personal history of tobacco use, presenting hazards to health Assessment & Plan: Discussed smoking.  Needs to quit.  Follow.    Personal history of colonic polyps Assessment & Plan: Saw Dr Lemar Livings.  Recommended holding colonoscopy until 2025-2026.     Lumbar stenosis with neurogenic claudication Assessment & Plan: Continues f/u with Dr Lovell Sheehan.    Primary hypertension Assessment & Plan: Continue hctz and metoprolol. Blood pressure doing well as outlined. Follow pressures.  Follow metabolic panel.    Hyperglycemia Assessment & Plan: Low carb diet and exercise.  Follow met b and a1c.  Lab Results  Component Value Date   HGBA1C 6.0 06/23/2023     Gastroesophageal reflux disease, unspecified whether esophagitis present Assessment & Plan: No upper symptoms reported.  Continue prilosec.     Anxiety Assessment & Plan: On cymbalta and prozac.   Follow closely.  Previously tried buspar and gabapentin - did not feel helped.  Has xanax .5mg  to use sparingly.    Other orders -     DULoxetine HCl; Take 1 capsule (60 mg total) by mouth daily.   Dispense: 30 capsule; Refill: 2 -     FLUoxetine HCl; Take 1 capsule (40 mg total) by mouth daily.  Dispense: 90 capsule; Refill: 1 -     ALPRAZolam; Take 1 tablet (0.5 mg total) by mouth daily as needed for anxiety.  Dispense: 30 tablet; Refill: 0     Dale [place], MD

## 2023-07-03 ENCOUNTER — Encounter: Payer: Self-pay | Admitting: Internal Medicine

## 2023-07-03 NOTE — Assessment & Plan Note (Signed)
Low carb diet and exercise.  Follow met b and a1c.  Lab Results  Component Value Date   HGBA1C 6.0 06/23/2023

## 2023-07-03 NOTE — Assessment & Plan Note (Signed)
Discussed smoking.  Needs to quit.  Follow.

## 2023-07-03 NOTE — Assessment & Plan Note (Signed)
Continues f/u with Dr Lovell Sheehan.

## 2023-07-03 NOTE — Assessment & Plan Note (Signed)
On cymbalta and prozac.   Follow closely.  Previously tried buspar and gabapentin - did not feel helped.  Has xanax .5mg  to use sparingly.

## 2023-07-03 NOTE — Assessment & Plan Note (Signed)
Saw Dr Bary Castilla.  Recommended holding colonoscopy until 2025-2026.

## 2023-07-03 NOTE — Assessment & Plan Note (Signed)
Continue praluent. 

## 2023-07-03 NOTE — Assessment & Plan Note (Signed)
Continue hctz and metoprolol. Blood pressure doing well as outlined. Follow pressures.  Follow metabolic panel.

## 2023-07-03 NOTE — Assessment & Plan Note (Signed)
No upper symptoms reported.  Continue prilosec.  

## 2023-07-03 NOTE — Assessment & Plan Note (Signed)
Continues on prozac and cymbalta.  She is limiting xanax usage. Follow.

## 2023-07-08 ENCOUNTER — Ambulatory Visit: Payer: Medicare Other | Admitting: Physician Assistant

## 2023-07-08 DIAGNOSIS — M1812 Unilateral primary osteoarthritis of first carpometacarpal joint, left hand: Secondary | ICD-10-CM | POA: Diagnosis not present

## 2023-07-11 DIAGNOSIS — M5416 Radiculopathy, lumbar region: Secondary | ICD-10-CM | POA: Diagnosis not present

## 2023-07-11 DIAGNOSIS — M48062 Spinal stenosis, lumbar region with neurogenic claudication: Secondary | ICD-10-CM | POA: Diagnosis not present

## 2023-07-17 NOTE — Progress Notes (Unsigned)
Cardiology Office Note    Date:  07/18/2023   ID:  Angel Riddle, DOB Sep 22, 1956, MRN [id number]  PCP:  Dale [place], MD  Cardiologist:  Julien Nordmann, MD  Electrophysiologist:  None   Chief Complaint: Follow up  History of Present Illness:   Angel Riddle is a 67 y.o. female with history of aortic atherosclerosis, HTN, HLD, prior tobacco use, osteoarthritis, and lumbar radiculitis use who presents for aortic atherosclerosis.  Prior documentation indicates remote cath in 2004 showing no significant CAD.  Chest CT in 03/2022 showed moderate aortic atherosclerosis without evidence of coronary artery calcification.  She was last seen by Dr. Mariah Milling in 04/2022 evaluation of tachypalpitations and chest discomfort.  She reported increased stress at home surrounding her husband's health.  It was noted EKG at outside office showed a sinus tachycardia with a rate of 111 bpm and vitals recording a rate of 120 bpm.  She was smoking less than 1/2 pack/day (had previously quit in 2012).  Sinus tachycardia was felt to be driven by stress/anxiety with recommendation to initiate Toprol-XL 25 mg.  Echo in 05/2022 showed an EF of 60 to 65%, no regional wall motion abnormalities, mild LVH, grade 1 diastolic dysfunction, normal RV systolic function, ventricular cavity size, and PASP, trivial mitral regurgitation, and an estimated right atrial pressure of 3 mmHg.  She comes in doing well from a cardiac perspective and is without symptoms of angina or cardiac decompensation.  No palpitations, dizziness, presyncope, or syncope.  No significant lower extremity swelling or progressive orthopnea.  Tolerating Lopressor without issues.  Continues to smoke 1 pack daily (previously quit for 7 years).  Having issues with back pain and sciatica.  No acute cardiac concerns at this time.   Labs independently reviewed: 06/2023 - potassium 3.7, BUN 19, SCr 0.89, TC 242, TG 164, HDL 60, LDL 149, albumin 4.2, AST/ALT  normal, A1c 6.0 05/2023 - Hgb 13.7, PLT 392 12/2022 - TSH normal  Past Medical History:  Diagnosis Date   Allergy    Anal fissure    Anxiety    a.) on BZO (alprazolam) PRN   Aortic atherosclerosis (HCC)    Arthritis    B12 deficiency    Basal cell carcinoma (BCC) of skin of nose    Chronic back pain    Degenerative disc disease, lumbar    Depression    DOE (dyspnea on exertion)    Female bladder prolapse    Gallstones    GERD (gastroesophageal reflux disease)    Heart murmur    Hx of colonic polyp    Hx: UTI (urinary tract infection)    Hypercholesterolemia    Hypertension    Long term current use of aspirin    Lumbar stenosis    Panic attacks    Wears partial dentures     Past Surgical History:  Procedure Laterality Date   ABDOMINAL HYSTERECTOMY  1992   partial   CARPOMETACARPAL (CMC) FUSION OF THUMB Left 05/25/2023   Procedure: SUSPENSION ARTHROPLASTY OF LEFT THUMB CMC JOINT;  Surgeon: Christena Flake, MD;  Location: [facility];  Service: Orthopedics;  Laterality: Left;   CATARACT EXTRACTION Bilateral [id number]   CHOLECYSTECTOMY  04/19/2014   COLONOSCOPY  07/02/2011   DR. Byrnett   COLONOSCOPY WITH PROPOFOL N/A 09/25/2015   Procedure: COLONOSCOPY WITH PROPOFOL;  Surgeon: Midge Minium, MD;  Location: [facility];  Service: Endoscopy;  Laterality: N/A;   FOOT SURGERY Right 2009   LUMBAR FUSION  2018  MOUTH SURGERY     dental implants, veneers   POLYPECTOMY  09/25/2015   Procedure: POLYPECTOMY;  Surgeon: Midge Minium, MD;  Location: [facility];  Service: Endoscopy;;   skin surgery Left 08/27/2013   Basal cell removal-left nostril   TONSILLECTOMY AND ADENOIDECTOMY  1969    Current Medications: Current Meds  Medication Sig   albuterol (VENTOLIN HFA) 108 (90 Base) MCG/ACT inhaler Inhale 2 puffs into the lungs every 6 (six) hours as needed for wheezing or shortness of breath.   Alirocumab (PRALUENT) 75 MG/ML SOAJ Inject 1 pen (75 mg) into the skin every  14 days.   ALPRAZolam (XANAX) 0.5 MG tablet Take 1 tablet (0.5 mg total) by mouth daily as needed for anxiety.   aspirin EC 81 MG tablet Take 1 tablet (81 mg total) by mouth daily. Swallow whole.   DULoxetine (CYMBALTA) 60 MG capsule Take 1 capsule (60 mg total) by mouth daily.   FLUoxetine (PROZAC) 40 MG capsule Take 1 capsule (40 mg total) by mouth daily.   hydrochlorothiazide (MICROZIDE) 12.5 MG capsule Take 1 capsule (12.5 mg total) by mouth daily.   HYDROcodone-acetaminophen (NORCO) 7.5-325 MG tablet Take 1-2 tablets by mouth every 6 (six) hours as needed. Using 1-2 times daily   meloxicam (MOBIC) 15 MG tablet Take 1 tablet (15 mg total) by mouth daily.   metoprolol succinate (TOPROL-XL) 25 MG 24 hr tablet Take 1 tablet (25 mg total) by mouth daily. Take with or immediately following a meal.   nicotine (NICODERM CQ - DOSED IN MG/24 HOURS) 14 mg/24hr patch Place 1 patch (14 mg total) onto the skin daily.   pantoprazole (PROTONIX) 40 MG tablet Take 1 tablet (40 mg total) by mouth daily.   potassium chloride (KLOR-CON) 10 MEQ tablet Take 1 tablet (10 mEq total) by mouth daily.   pregabalin (LYRICA) 75 MG capsule Take 75 mg by mouth 2 (two) times daily.   [DISCONTINUED] metoprolol tartrate (LOPRESSOR) 25 MG tablet Take 1 tablet (25 mg total) by mouth daily.    Allergies:   Doxycycline, Gadolinium, Gadolinium derivatives, Iodinated contrast media, Levofloxacin, Silicone, Levaquin [levofloxacin in d5w], and Adhesive [tape]   Social History   Socioeconomic History   Marital status: Widowed    Spouse name: Not on file   Number of children: 2   Years of education: Not on file   Highest education level: Some college, no degree  Occupational History   Not on file  Tobacco Use   Smoking status: Every Day    Current packs/day: 0.00    Average packs/day: 1 pack/day for 37.0 years (37.0 ttl pk-yrs)    Types: Cigarettes    Start date: 08/07/2019    Last attempt to quit: 05/25/2021    Years since  quitting: 2.1   Smokeless tobacco: Never   Tobacco comments:    Smokes 1/2 PPD   Vaping Use   Vaping status: Former  Substance and Sexual Activity   Alcohol use: Yes    Alcohol/week: 1.0 standard drink of alcohol    Types: 1 Glasses of wine per week    Comment: occassional   Drug use: No   Sexual activity: Not on file  Other Topics Concern   Not on file  Social History Narrative   Lives alone   Social Determinants of Health   Financial Resource Strain: Low Risk  (06/23/2023)   Overall Financial Resource Strain (CARDIA)    Difficulty of Paying Living Expenses: Not very hard  Food Insecurity: No  Food Insecurity (06/23/2023)   Hunger Vital Sign    Worried About Running Out of Food in the Last Year: Never true    Ran Out of Food in the Last Year: Never true  Transportation Needs: No Transportation Needs (06/23/2023)   PRAPARE - Administrator, Civil Service (Medical): No    Lack of Transportation (Non-Medical): No  Physical Activity: Unknown (06/23/2023)   Exercise Vital Sign    Days of Exercise per Week: 0 days    Minutes of Exercise per Session: Not on file  Stress: No Stress Concern Present (06/23/2023)   [employer] of Occupational Health - Occupational Stress Questionnaire    Feeling of Stress : Only a little  Social Connections: Moderately Integrated (06/23/2023)   Social Connection and Isolation Panel [NHANES]    Frequency of Communication with Friends and Family: More than three times a week    Frequency of Social Gatherings with Friends and Family: Three times a week    Attends Religious Services: More than 4 times per year    Active Member of Clubs or Organizations: Not on file    Attends Club or Organization Meetings: More than 4 times per year    Marital Status: Widowed     Family History:  The patient's family history includes Alcohol abuse in her father; Diabetes in her father; Heart disease in her father; Hyperlipidemia in her brother, father,  mother, and sister; Hypertension in her mother; Lung cancer in her brother. There is no history of Breast cancer.  ROS:   12-point review of systems is negative unless otherwise noted in the HPI.   EKGs/Labs/Other Studies Reviewed:    Studies reviewed were summarized above. The additional studies were reviewed today:  2D echo 05/13/2022: 1. Left ventricular ejection fraction, by estimation, is 60 to 65%. The  left ventricle has normal function. The left ventricle has no regional  wall motion abnormalities. There is mild left ventricular hypertrophy.  Left ventricular diastolic parameters  are consistent with Grade I diastolic dysfunction (impaired relaxation).  The average left ventricular global longitudinal strain is -16.2 %.   2. Right ventricular systolic function is normal. The right ventricular  size is normal. There is normal pulmonary artery systolic pressure. The  estimated right ventricular systolic pressure is 30.0 mmHg.   3. The mitral valve is normal in structure. Trivial mitral valve  regurgitation. No evidence of mitral stenosis.   4. The aortic valve is normal in structure. Aortic valve regurgitation is  not visualized. No aortic stenosis is present.   5. The inferior vena cava is normal in size with greater than 50%  respiratory variability, suggesting right atrial pressure of 3 mmHg.   Comparison(s): 04/2010-EF 60%.    EKG:  EKG is ordered today.  The EKG ordered today demonstrates NSR, 87 bpm, no acute st/t changes  Recent Labs: 12/23/2022: TSH 1.93 03/29/2023: Hemoglobin 14.4; Platelets 348.0 06/23/2023: ALT 8; BUN 19; Creatinine, Ser 0.89; Potassium 3.7; Sodium 139  Recent Lipid Panel    Component Value Date/Time   CHOL 242 (H) 06/23/2023 1100   TRIG 164.0 (H) 06/23/2023 1100   HDL 60.40 06/23/2023 1100   CHOLHDL 4 06/23/2023 1100   VLDL 32.8 06/23/2023 1100   LDLCALC 149 (H) 06/23/2023 1100   LDLDIRECT 150.0 03/25/2023 0955    PHYSICAL EXAM:    VS:   BP 136/86 (BP Location: Left Arm, Patient Position: Sitting, Cuff Size: Normal)   Pulse 87   Ht 5\' 5"  (1.651  m)   Wt 173 lb 3.2 oz (78.6 kg)   SpO2 96%   BMI 28.82 kg/m   BMI: Body mass index is 28.82 kg/m.  Physical Exam Vitals reviewed.  Constitutional:      Appearance: She is well-developed.  HENT:     Head: Normocephalic and atraumatic.  Eyes:     General:        Right eye: No discharge.        Left eye: No discharge.  Neck:     Vascular: No JVD.  Cardiovascular:     Rate and Rhythm: Normal rate and regular rhythm.     Heart sounds: Normal heart sounds, S1 normal and S2 normal. Heart sounds not distant. No midsystolic click and no opening snap. No murmur heard.    No friction rub.  Pulmonary:     Effort: Pulmonary effort is normal. No respiratory distress.     Breath sounds: Normal breath sounds. No decreased breath sounds, wheezing or rales.  Chest:     Chest wall: No tenderness.  Abdominal:     General: There is no distension.  Musculoskeletal:     Cervical back: Normal range of motion.     Right lower leg: No edema.     Left lower leg: No edema.  Skin:    General: Skin is warm and dry.     Nails: There is no clubbing.  Neurological:     Mental Status: She is alert and oriented to person, place, and time.  Psychiatric:        Speech: Speech normal.        Behavior: Behavior normal.        Thought Content: Thought content normal.        Judgment: Judgment normal.     Wt Readings from Last 3 Encounters:  07/18/23 173 lb 3.2 oz (78.6 kg)  06/28/23 177 lb (80.3 kg)  06/13/23 171 lb (77.6 kg)     ASSESSMENT & PLAN:   Sinus tachycardia: Overall well-controlled.  Continue metoprolol succinate 25 mg daily.  Aortic atherosclerosis/HLD: LDL 149 in 06/2023.  Target LDL < 70.  She had been off PCSK9 inhibitor at time of last lipid panel.  Now back on Praluent.  She indicates follow-up lipids will be obtained by PCP.  HTN: Blood pressure is reasonably controlled  at today's office visit.  She remains on HCTZ and metoprolol.   Disposition: F/u with Dr. Mariah Milling or an APP in 12 months.   Medication Adjustments/Labs and Tests Ordered: Current medicines are reviewed at length with the patient today.  Concerns regarding medicines are outlined above. Medication changes, Labs and Tests ordered today are summarized above and listed in the Patient Instructions accessible in Encounters.   Signed, Eula Listen, PA-C 07/18/2023 3:05 PM     [facility] [place] [address] [place], [place] [address] [phone]

## 2023-07-18 ENCOUNTER — Encounter: Payer: Self-pay | Admitting: Physician Assistant

## 2023-07-18 ENCOUNTER — Ambulatory Visit: Payer: Medicare Other | Attending: Physician Assistant | Admitting: Physician Assistant

## 2023-07-18 ENCOUNTER — Other Ambulatory Visit: Payer: Self-pay | Admitting: Internal Medicine

## 2023-07-18 VITALS — BP 136/86 | HR 87 | Ht 65.0 in | Wt 173.2 lb

## 2023-07-18 DIAGNOSIS — I700 Atherosclerosis of aorta: Secondary | ICD-10-CM | POA: Diagnosis not present

## 2023-07-18 DIAGNOSIS — R000 Tachycardia, unspecified: Secondary | ICD-10-CM | POA: Insufficient documentation

## 2023-07-18 DIAGNOSIS — E785 Hyperlipidemia, unspecified: Secondary | ICD-10-CM | POA: Insufficient documentation

## 2023-07-18 MED ORDER — METOPROLOL SUCCINATE ER 25 MG PO TB24: 25.0000 mg | ORAL_TABLET | Freq: Every day | ORAL | 3 refills | Status: DC

## 2023-07-18 MED ORDER — NICOTINE 14 MG/24HR TD PT24: 14.0000 mg | MEDICATED_PATCH | Freq: Every day | TRANSDERMAL | 1 refills | Status: DC

## 2023-07-18 MED ORDER — METOPROLOL TARTRATE 25 MG PO TABS: 25.0000 mg | ORAL_TABLET | Freq: Every day | ORAL | 3 refills | Status: DC

## 2023-07-18 NOTE — Patient Instructions (Addendum)
Medication Instructions:  Your physician recommends the following medication changes.  START TAKING: Nicotine patch 14 mg on to skin once per day   *If you need a refill on your cardiac medications before your next appointment, please call your pharmacy*   Lab Work: NONE  Testing/Procedures: None   Follow-Up: At [facility], you and your health needs are our priority.  As part of our continuing mission to provide you with exceptional heart care, we have created designated Provider Care Teams.  These Care Teams include your primary Cardiologist (physician) and Advanced Practice Providers (APPs -  Physician Assistants and Nurse Practitioners) who all work together to provide you with the care you need, when you need it.  We recommend signing up for the patient portal called "MyChart".  Sign up information is provided on this After Visit Summary.  MyChart is used to connect with patients for Virtual Visits (Telemedicine).  Patients are able to view lab/test results, encounter notes, upcoming appointments, etc.  Non-urgent messages can be sent to your provider as well.   To learn more about what you can do with MyChart, go to [email or web address].    Your next appointment:   12 month(s)  Provider:   You may see Julien Nordmann, MD or one of the following Advanced Practice Providers on your designated Care Team:    Eula Listen, [place]

## 2023-07-22 DIAGNOSIS — Z6828 Body mass index (BMI) 28.0-28.9, adult: Secondary | ICD-10-CM | POA: Diagnosis not present

## 2023-07-22 DIAGNOSIS — M4807 Spinal stenosis, lumbosacral region: Secondary | ICD-10-CM | POA: Diagnosis not present

## 2023-07-26 NOTE — Telephone Encounter (Signed)
Error

## 2023-07-29 ENCOUNTER — Encounter: Payer: Self-pay | Admitting: Internal Medicine

## 2023-07-29 DIAGNOSIS — M48061 Spinal stenosis, lumbar region without neurogenic claudication: Secondary | ICD-10-CM | POA: Insufficient documentation

## 2023-08-12 ENCOUNTER — Other Ambulatory Visit: Payer: Self-pay | Admitting: Student

## 2023-08-12 DIAGNOSIS — M5416 Radiculopathy, lumbar region: Secondary | ICD-10-CM

## 2023-08-15 DIAGNOSIS — M5136 Other intervertebral disc degeneration, lumbar region: Secondary | ICD-10-CM | POA: Diagnosis not present

## 2023-08-15 DIAGNOSIS — M5416 Radiculopathy, lumbar region: Secondary | ICD-10-CM | POA: Diagnosis not present

## 2023-08-15 DIAGNOSIS — M48062 Spinal stenosis, lumbar region with neurogenic claudication: Secondary | ICD-10-CM | POA: Diagnosis not present

## 2023-08-19 ENCOUNTER — Telehealth: Payer: Self-pay

## 2023-08-19 NOTE — Telephone Encounter (Signed)
Phone call to patient to review instructions for 13 hr prep for CT myelogram w/ contrast on 08/22/23 at 1:00 PM. Prescription called into [facility]. Pt aware and verbalized understanding of instructions. Prescription: 08/22/23 @ 12:00 AM- 50mg  Prednisone 08/22/23 @ 6:00 AM- 50mg  Prednisone 08/22/23 @ 12:00 PM - 50mg  Prednisone and 50mg  Benadryl

## 2023-08-19 NOTE — Discharge Instructions (Signed)
Myelogram Discharge Instructions  Go home and rest quietly as needed. You may resume normal activities; however, do not exert yourself strongly or do any heavy lifting today and tomorrow.   DO NOT drive today.    You may resume your normal diet and medications unless otherwise indicated. Drink lots of extra fluids today and tomorrow.   The incidence of headache, nausea, or vomiting is about 5% (one in 20 patients).  If you develop a headache, lie flat for 24 hours and drink plenty of fluids until the headache goes away.  Caffeinated beverages may be helpful. If when you get up you still have a headache when standing, go back to bed and force fluids for another 24 hours.   If you develop severe nausea and vomiting or a headache that does not go away with the flat bedrest after 48 hours, please call [phone].   Call your physician for a follow-up appointment.  The results of your myelogram will be sent directly to your physician by the following day.  If you have any questions or if complications develop after you arrive home, please call [phone].  Discharge instructions have been explained to the patient.  The patient, or the person responsible for the patient, fully understands these instructions.   Thank you for visiting our office today.

## 2023-08-21 ENCOUNTER — Other Ambulatory Visit: Payer: Self-pay | Admitting: Internal Medicine

## 2023-08-22 ENCOUNTER — Ambulatory Visit
Admission: RE | Admit: 2023-08-22 | Discharge: 2023-08-22 | Disposition: A | Discharge status: Home / Self Care | Payer: Medicare Other | Source: Ambulatory Visit | Attending: Student

## 2023-08-22 ENCOUNTER — Ambulatory Visit
Admission: RE | Admit: 2023-08-22 | Discharge: 2023-08-22 | Disposition: A | Discharge status: Home / Self Care | Payer: Medicare Other | Source: Ambulatory Visit | Attending: Student | Admitting: Student

## 2023-08-22 ENCOUNTER — Other Ambulatory Visit (HOSPITAL_COMMUNITY): Payer: Self-pay

## 2023-08-22 DIAGNOSIS — M5416 Radiculopathy, lumbar region: Secondary | ICD-10-CM

## 2023-08-22 DIAGNOSIS — I700 Atherosclerosis of aorta: Secondary | ICD-10-CM | POA: Diagnosis not present

## 2023-08-22 DIAGNOSIS — M4807 Spinal stenosis, lumbosacral region: Secondary | ICD-10-CM | POA: Diagnosis not present

## 2023-08-22 DIAGNOSIS — M47816 Spondylosis without myelopathy or radiculopathy, lumbar region: Secondary | ICD-10-CM | POA: Diagnosis not present

## 2023-08-22 DIAGNOSIS — M48061 Spinal stenosis, lumbar region without neurogenic claudication: Secondary | ICD-10-CM | POA: Diagnosis not present

## 2023-08-22 DIAGNOSIS — Z981 Arthrodesis status: Secondary | ICD-10-CM | POA: Diagnosis not present

## 2023-08-22 DIAGNOSIS — M47817 Spondylosis without myelopathy or radiculopathy, lumbosacral region: Secondary | ICD-10-CM | POA: Diagnosis not present

## 2023-08-22 MED ORDER — ONDANSETRON HCL 4 MG/2ML IJ SOLN: 4.0000 mg | Freq: Once | INTRAMUSCULAR | Status: DC | PRN

## 2023-08-22 MED ORDER — DIAZEPAM 5 MG PO TABS
5.0000 mg | ORAL_TABLET | Freq: Once | ORAL | Status: AC
  Administered 2023-08-22: 5 mg via ORAL

## 2023-08-22 MED ORDER — IOPAMIDOL (ISOVUE-M 200) INJECTION 41%
20.0000 mL | Freq: Once | INTRAMUSCULAR | Status: AC
  Administered 2023-08-22: 20 mL via INTRATHECAL

## 2023-08-22 MED ORDER — MEPERIDINE HCL 50 MG/ML IJ SOLN: 50.0000 mg | Freq: Once | INTRAMUSCULAR | Status: DC | PRN

## 2023-08-22 NOTE — Progress Notes (Signed)
Pt reports she took her 13 hr prep as directed for myelogram procedure.

## 2023-08-23 ENCOUNTER — Other Ambulatory Visit: Payer: Self-pay

## 2023-08-23 ENCOUNTER — Telehealth: Payer: Self-pay | Admitting: Internal Medicine

## 2023-08-23 DIAGNOSIS — E7800 Pure hypercholesterolemia, unspecified: Secondary | ICD-10-CM

## 2023-08-23 MED ORDER — PRALUENT 75 MG/ML ~~LOC~~ SOAJ
75.0000 mg | SUBCUTANEOUS | 3 refills | Status: DC
Start: 2023-08-23 — End: 2024-08-24
  Filled 2023-08-23 – 2023-09-16 (×2): qty 6, 84d supply, fill #0
  Filled 2023-12-10: qty 2, 28d supply, fill #1
  Filled 2024-01-03 – 2024-01-25 (×3): qty 2, 28d supply, fill #2
  Filled 2024-03-15: qty 2, 28d supply, fill #3
  Filled 2024-04-19: qty 2, 28d supply, fill #4
  Filled 2024-05-21: qty 2, 28d supply, fill #5
  Filled 2024-06-13: qty 2, 28d supply, fill #6
  Filled 2024-07-11: qty 2, 28d supply, fill #7
  Filled 2024-08-08: qty 2, 28d supply, fill #8

## 2023-08-23 NOTE — Telephone Encounter (Signed)
Prescription Request  08/23/2023  LOV: 06/28/2023  What is the name of the medication or equipment? PRALUENT   Have you contacted your pharmacy to request a refill? Yes   Which pharmacy would you like this sent to? [facility]  Patient notified that their request is being sent to the clinical staff for review and that they should receive a response within 2 business days.   Please advise at Mobile [phone] (mobile)

## 2023-08-23 NOTE — Telephone Encounter (Signed)
Medication refilled

## 2023-08-24 ENCOUNTER — Other Ambulatory Visit: Payer: Self-pay

## 2023-08-26 ENCOUNTER — Other Ambulatory Visit: Payer: Self-pay

## 2023-09-06 DIAGNOSIS — M4316 Spondylolisthesis, lumbar region: Secondary | ICD-10-CM | POA: Diagnosis not present

## 2023-09-12 DIAGNOSIS — M51362 Other intervertebral disc degeneration, lumbar region with discogenic back pain and lower extremity pain: Secondary | ICD-10-CM | POA: Diagnosis not present

## 2023-09-12 DIAGNOSIS — M5416 Radiculopathy, lumbar region: Secondary | ICD-10-CM | POA: Diagnosis not present

## 2023-09-12 DIAGNOSIS — M48062 Spinal stenosis, lumbar region with neurogenic claudication: Secondary | ICD-10-CM | POA: Diagnosis not present

## 2023-09-15 ENCOUNTER — Telehealth: Payer: Self-pay | Admitting: Internal Medicine

## 2023-09-15 DIAGNOSIS — E7800 Pure hypercholesterolemia, unspecified: Secondary | ICD-10-CM

## 2023-09-15 NOTE — Telephone Encounter (Signed)
Pt would like to be called concerning an ozempic medication

## 2023-09-16 ENCOUNTER — Other Ambulatory Visit: Payer: Self-pay

## 2023-09-16 ENCOUNTER — Other Ambulatory Visit (HOSPITAL_COMMUNITY): Payer: Self-pay

## 2023-09-16 NOTE — Telephone Encounter (Signed)
Patient called to state the praluent was going to cost $300.  Patient states she cannot afford this and this is the first time she has been told that she would have to pay.  Patient states she would like to know what she should do.

## 2023-09-16 NOTE — Telephone Encounter (Signed)
Pt was calling about praluent not ozempic. Her refill has been sent to pharmacy. Patient is aware.

## 2023-09-16 NOTE — Telephone Encounter (Signed)
Called and spoke with specialty pharmacy. [employer]Well grant for praluent has expired. Pharmacy referral placed and number for [employer] given to patient to call per pharmacy. [phone]

## 2023-09-19 ENCOUNTER — Other Ambulatory Visit: Payer: Self-pay | Admitting: Internal Medicine

## 2023-09-26 NOTE — Progress Notes (Unsigned)
   09/27/2023 Name: Angel Riddle MRN: [id number] DOB: 1956/03/20  Subjective  Chief Complaint  Patient presents with   Medication Assistance    Reason for visit: Angel Riddle is a 67 y.o. year old female who presented for a telephone visit.   They were referred to the pharmacist by their PCP for assistance in managing medication access.   Care Team: Primary Care Provider: Dale Chilhowee, MD  Reason for visit: ?  Angel Riddle is a 67 y.o. female who presents today for a phone visit with the pharmacist due to medication access concerns. Needs to renew HealthWell grant for Praluent. Coverage has expired.   Medication Access: ?  Prescription drug coverage: Payor: MEDICARE / Plan: MEDICARE PART A AND B / Product Type: *No Product type* / .   Current Patient Assistance: [employer] HLD Fund (expired)  Assessment and Plan:   1. Medication Access: Patient is enrolled in [employer] HLD [name] though enrollment has expired. Patient requests information regarding re-enrollment.  Provided patient with options for [employer]well re-enrollment for 2025. Her preference is to call the program. Provided patient with my direct line for any questions that may arise during the re-enrollment process.   Future Appointments  Date Time Provider Department Center  10/28/2023 11:00 AM [facility]  11/01/2023  1:00 PM Dale San [place], MD [facility]  03/28/2024  2:30 PM [facility] ANNUAL WELLNESS VISIT [facility]    Loree Fee, PharmD Clinical Pharmacist [facility] [phone]

## 2023-09-27 ENCOUNTER — Other Ambulatory Visit: Payer: Medicare Other | Admitting: Pharmacist

## 2023-09-27 ENCOUNTER — Other Ambulatory Visit (HOSPITAL_COMMUNITY): Payer: Self-pay

## 2023-09-27 ENCOUNTER — Encounter: Payer: Self-pay | Admitting: Pharmacist

## 2023-09-27 NOTE — Patient Instructions (Addendum)
Ms. Angel Riddle,   It was a pleasure to speak with you today! As we discussed:?   Re-enrollment for the [employer]Well [name] has opened for Medicare patients for 2025.   Your program is called the [employer]Well Grant, Hyperlipidemia (cholesterol) Fund Re-enrollment can be completed via phone or online: [employer]Well: [phone] M-F 9am-5pm Online: [email or web address] Mobile App: HealthWell Patient Mobile App   Please reach out with any questions that may arise during re-enrollment process.   Please reach out prior to your next scheduled appointment should you have any questions or concerns.  Thank you!   Future Appointments  Date Time Provider Department Center  09/27/2023  3:30 PM [facility] PHARMACIST [facility]  10/28/2023 11:00 AM [facility]  11/01/2023  1:00 PM Dale [place], MD [facility]  03/28/2024  2:30 PM [facility] ANNUAL WELLNESS VISIT [facility]    Loree Fee, PharmD Clinical Pharmacist [facility] [phone]

## 2023-09-28 ENCOUNTER — Other Ambulatory Visit: Payer: Self-pay

## 2023-10-05 ENCOUNTER — Other Ambulatory Visit: Payer: Self-pay | Admitting: Internal Medicine

## 2023-10-07 NOTE — Telephone Encounter (Signed)
Rx ok'd for alprazolam #30 with no refills.

## 2023-10-12 DIAGNOSIS — M48062 Spinal stenosis, lumbar region with neurogenic claudication: Secondary | ICD-10-CM | POA: Diagnosis not present

## 2023-10-12 DIAGNOSIS — M5416 Radiculopathy, lumbar region: Secondary | ICD-10-CM | POA: Diagnosis not present

## 2023-10-13 ENCOUNTER — Other Ambulatory Visit: Payer: Self-pay | Admitting: Internal Medicine

## 2023-10-13 DIAGNOSIS — I10 Essential (primary) hypertension: Secondary | ICD-10-CM

## 2023-10-14 DIAGNOSIS — M1812 Unilateral primary osteoarthritis of first carpometacarpal joint, left hand: Secondary | ICD-10-CM | POA: Diagnosis not present

## 2023-10-25 ENCOUNTER — Telehealth: Payer: Self-pay | Admitting: Internal Medicine

## 2023-10-25 DIAGNOSIS — I10 Essential (primary) hypertension: Secondary | ICD-10-CM

## 2023-10-25 DIAGNOSIS — R739 Hyperglycemia, unspecified: Secondary | ICD-10-CM

## 2023-10-25 DIAGNOSIS — E7800 Pure hypercholesterolemia, unspecified: Secondary | ICD-10-CM

## 2023-10-25 DIAGNOSIS — R8290 Unspecified abnormal findings in urine: Secondary | ICD-10-CM

## 2023-10-25 NOTE — Telephone Encounter (Signed)
Patient need lab orders.

## 2023-10-26 NOTE — Telephone Encounter (Signed)
Labs ordered.

## 2023-10-26 NOTE — Telephone Encounter (Signed)
Pt called and wanted to see if Lorin Picket is able to add a UTI order to the labs on Friday

## 2023-10-27 DIAGNOSIS — G5723 Lesion of femoral nerve, bilateral lower limbs: Secondary | ICD-10-CM | POA: Diagnosis not present

## 2023-10-27 NOTE — Addendum Note (Signed)
Addended by: Charm Barges on: 10/27/2023 12:50 PM   Modules accepted: Orders

## 2023-10-27 NOTE — Telephone Encounter (Signed)
I am ok to check a urine, but I need to know what symptoms she is having.  If urinary symptoms, confirm eating, no vomiting, increased abdominal pain, etc.

## 2023-10-27 NOTE — Telephone Encounter (Signed)
Order placed for urine and culture °

## 2023-10-27 NOTE — Telephone Encounter (Signed)
Added note to lab schedule to collect urine also

## 2023-10-28 ENCOUNTER — Other Ambulatory Visit (INDEPENDENT_AMBULATORY_CARE_PROVIDER_SITE_OTHER): Payer: Medicare Other

## 2023-10-28 ENCOUNTER — Other Ambulatory Visit: Payer: Self-pay | Admitting: Internal Medicine

## 2023-10-28 ENCOUNTER — Telehealth: Payer: Self-pay | Admitting: Internal Medicine

## 2023-10-28 DIAGNOSIS — I10 Essential (primary) hypertension: Secondary | ICD-10-CM

## 2023-10-28 DIAGNOSIS — E7800 Pure hypercholesterolemia, unspecified: Secondary | ICD-10-CM

## 2023-10-28 DIAGNOSIS — R739 Hyperglycemia, unspecified: Secondary | ICD-10-CM | POA: Diagnosis not present

## 2023-10-28 DIAGNOSIS — R8290 Unspecified abnormal findings in urine: Secondary | ICD-10-CM | POA: Diagnosis not present

## 2023-10-28 LAB — URINALYSIS, ROUTINE W REFLEX MICROSCOPIC
Bilirubin Urine: NEGATIVE
Ketones, ur: NEGATIVE
Nitrite: NEGATIVE
Specific Gravity, Urine: <=1.005 — AB (ref 1.000–1.030)
Total Protein, Urine: NEGATIVE
Urine Glucose: NEGATIVE
Urobilinogen, UA: 0.2 (ref 0.0–1.0)
pH: 6 (ref 5.0–8.0)

## 2023-10-28 LAB — LIPID PANEL
Cholesterol: 233 mg/dL — ABNORMAL HIGH (ref 0–200)
HDL: 45.6 mg/dL (ref >39.00)
LDL Cholesterol: 136 mg/dL — ABNORMAL HIGH (ref 0–99)
NonHDL: 186.92
Total CHOL/HDL Ratio: 5
Triglycerides: 253 mg/dL — ABNORMAL HIGH (ref 0.0–149.0)
VLDL: 50.6 mg/dL — ABNORMAL HIGH (ref 0.0–40.0)

## 2023-10-28 LAB — BASIC METABOLIC PANEL
BUN: 21 mg/dL (ref 6–23)
CO2: 30 meq/L (ref 19–32)
Calcium: 9.2 mg/dL (ref 8.4–10.5)
Chloride: 104 meq/L (ref 96–112)
Creatinine, Ser: 0.79 mg/dL (ref 0.40–1.20)
GFR: 77.24 mL/min (ref >60.00)
Glucose, Bld: 69 mg/dL — ABNORMAL LOW (ref 70–99)
Potassium: 3.5 meq/L (ref 3.5–5.1)
Sodium: 141 meq/L (ref 135–145)

## 2023-10-28 LAB — HEPATIC FUNCTION PANEL
ALT: 7 U/L (ref 0–35)
AST: 16 U/L (ref 0–37)
Albumin: 4 g/dL (ref 3.5–5.2)
Alkaline Phosphatase: 99 U/L (ref 39–117)
Bilirubin, Direct: 0.1 mg/dL (ref 0.0–0.3)
Total Bilirubin: 0.5 mg/dL (ref 0.2–1.2)
Total Protein: 6.3 g/dL (ref 6.0–8.3)

## 2023-10-28 LAB — HEMOGLOBIN A1C: Hgb A1c MFr Bld: 6.2 % (ref 4.6–6.5)

## 2023-10-28 MED ORDER — CEFDINIR 300 MG PO CAPS: 300.0000 mg | ORAL_CAPSULE | Freq: Two times a day (BID) | ORAL | 0 refills | Status: DC

## 2023-10-28 NOTE — Addendum Note (Signed)
Addended by: Sandy Salaam on: 10/28/2023 05:12 PM   Modules accepted: Orders

## 2023-10-28 NOTE — Telephone Encounter (Signed)
Please call and notify - her urinalysis appears to be c/w an infection. Culture is pending. Given symptoms and urine, would like to treat.  Confirm only abx she is allergic to are levaquin and doxycycline. If so, then I would like to treat with omnicef 300mg  bid x 5 days.  Keep appt next week.

## 2023-10-28 NOTE — Telephone Encounter (Signed)
Spoke with pt and she stated that she is only allergic to levaquin and doxycycline. Truman Hayward has been sent in and pt is aware.

## 2023-10-30 LAB — URINE CULTURE
MICRO NUMBER:: 15768692
SPECIMEN QUALITY:: ADEQUATE

## 2023-10-31 NOTE — Telephone Encounter (Signed)
Pt has appt 11/01/23.  D/w her at appt regarding need for refill.

## 2023-11-01 ENCOUNTER — Ambulatory Visit (INDEPENDENT_AMBULATORY_CARE_PROVIDER_SITE_OTHER): Payer: Medicare Other | Admitting: Internal Medicine

## 2023-11-01 ENCOUNTER — Encounter: Payer: Self-pay | Admitting: Internal Medicine

## 2023-11-01 VITALS — BP 128/78 | HR 82 | Temp 98.2°F | Ht 65.0 in | Wt 190.8 lb

## 2023-11-01 DIAGNOSIS — R319 Hematuria, unspecified: Secondary | ICD-10-CM | POA: Diagnosis not present

## 2023-11-01 DIAGNOSIS — R739 Hyperglycemia, unspecified: Secondary | ICD-10-CM

## 2023-11-01 DIAGNOSIS — Z860100 Personal history of colon polyps, unspecified: Secondary | ICD-10-CM | POA: Diagnosis not present

## 2023-11-01 DIAGNOSIS — E7800 Pure hypercholesterolemia, unspecified: Secondary | ICD-10-CM | POA: Diagnosis not present

## 2023-11-01 DIAGNOSIS — M48062 Spinal stenosis, lumbar region with neurogenic claudication: Secondary | ICD-10-CM | POA: Diagnosis not present

## 2023-11-01 DIAGNOSIS — K219 Gastro-esophageal reflux disease without esophagitis: Secondary | ICD-10-CM | POA: Diagnosis not present

## 2023-11-01 DIAGNOSIS — I700 Atherosclerosis of aorta: Secondary | ICD-10-CM | POA: Diagnosis not present

## 2023-11-01 DIAGNOSIS — I10 Essential (primary) hypertension: Secondary | ICD-10-CM

## 2023-11-01 DIAGNOSIS — F320 Major depressive disorder, single episode, mild: Secondary | ICD-10-CM

## 2023-11-01 DIAGNOSIS — Z713 Dietary counseling and surveillance: Secondary | ICD-10-CM

## 2023-11-01 MED ORDER — PANTOPRAZOLE SODIUM 40 MG PO TBEC: 40.0000 mg | DELAYED_RELEASE_TABLET | Freq: Every day | ORAL | 3 refills | Status: DC

## 2023-11-01 MED ORDER — POTASSIUM CHLORIDE ER 10 MEQ PO TBCR: 10.0000 meq | EXTENDED_RELEASE_TABLET | Freq: Every day | ORAL | 1 refills | Status: DC

## 2023-11-01 MED ORDER — FLUOXETINE HCL 40 MG PO CAPS: ORAL_CAPSULE | ORAL | 1 refills | Status: DC

## 2023-11-01 MED ORDER — HYDROCHLOROTHIAZIDE 12.5 MG PO CAPS: 12.5000 mg | ORAL_CAPSULE | Freq: Every day | ORAL | 1 refills | Status: DC

## 2023-11-01 MED ORDER — MELOXICAM 15 MG PO TABS: 15.0000 mg | ORAL_TABLET | Freq: Every day | ORAL | 1 refills | Status: DC

## 2023-11-01 MED ORDER — TIRZEPATIDE-WEIGHT MANAGEMENT 2.5 MG/0.5ML ~~LOC~~ SOLN: 2.5000 mg | SUBCUTANEOUS | 2 refills | Status: DC

## 2023-11-01 NOTE — Progress Notes (Signed)
Subjective:    Patient ID: Angel Riddle, female    DOB: 27-Dec-1955, 67 y.o.   MRN: [id number]  Patient here for  Chief Complaint  Patient presents with   Medical Management of Chronic Issues    HPI Here for a follow up appt - Follow up regarding hypertension and hypercholesterolemia. Saw Dr Ether Griffins 10/27/23 - bilateral foot and leg pain. Dx - femoral neuropathy.  No foot problems. Seeing Dr Yves Dill- low back pain. Continue tylenol, norco prn and lyrica.  Recently took prednisone taper. Last prednisone today.  Pain does limit her activity.  Takes meloxicam. No chest pain or sob reported. No abdominal pain or bowel change reported. Recent UTI.  Treated with omnicef. Symptoms improved/resolved. Discussed weight loss.  Unable to afford wegovy. Wants to try zepbound. Discussed diet and exercise.    Past Medical History:  Diagnosis Date   Allergy    Anal fissure    Anxiety    a.) on BZO (alprazolam) PRN   Aortic atherosclerosis (HCC)    Arthritis    B12 deficiency    Basal cell carcinoma (BCC) of skin of nose    Chronic back pain    Degenerative disc disease, lumbar    Depression    DOE (dyspnea on exertion)    Female bladder prolapse    Gallstones    GERD (gastroesophageal reflux disease)    Heart murmur    Hx of colonic polyp    Hx: UTI (urinary tract infection)    Hypercholesterolemia    Hypertension    Long term current use of aspirin    Lumbar stenosis    Panic attacks    Wears partial dentures    Past Surgical History:  Procedure Laterality Date   ABDOMINAL HYSTERECTOMY  1992   partial   CARPOMETACARPAL (CMC) FUSION OF THUMB Left 05/25/2023   Procedure: SUSPENSION ARTHROPLASTY OF LEFT THUMB CMC JOINT;  Surgeon: Christena Flake, MD;  Location: [facility];  Service: Orthopedics;  Laterality: Left;   CATARACT EXTRACTION Bilateral [id number]   CHOLECYSTECTOMY  04/19/2014   COLONOSCOPY  07/02/2011   DR. Byrnett   COLONOSCOPY WITH PROPOFOL N/A 09/25/2015   Procedure:  COLONOSCOPY WITH PROPOFOL;  Surgeon: Midge Minium, MD;  Location: [facility];  Service: Endoscopy;  Laterality: N/A;   FOOT SURGERY Right 2009   LUMBAR FUSION  2018   MOUTH SURGERY     dental implants, veneers   POLYPECTOMY  09/25/2015   Procedure: POLYPECTOMY;  Surgeon: Midge Minium, MD;  Location: [facility];  Service: Endoscopy;;   skin surgery Left 08/27/2013   Basal cell removal-left nostril   TONSILLECTOMY AND ADENOIDECTOMY  1969   Family History  Problem Relation Age of Onset   Hyperlipidemia Mother    Hypertension Mother    Alcohol abuse Father    Hyperlipidemia Father    Heart disease Father    Diabetes Father    Hyperlipidemia Sister    Lung cancer Brother    Hyperlipidemia Brother    Breast cancer Neg Hx    Social History   Socioeconomic History   Marital status: Widowed    Spouse name: Not on file   Number of children: 2   Years of education: Not on file   Highest education level: Some college, no degree  Occupational History   Not on file  Tobacco Use   Smoking status: Every Day    Current packs/day: 0.00    Average packs/day: 1 pack/day for 37.0 years (37.0  ttl pk-yrs)    Types: Cigarettes    Start date: 08/07/2019    Last attempt to quit: 05/25/2021    Years since quitting: 2.4   Smokeless tobacco: Never   Tobacco comments:    Smokes 1/2 PPD   Vaping Use   Vaping status: Former  Substance and Sexual Activity   Alcohol use: Yes    Alcohol/week: 1.0 standard drink of alcohol    Types: 1 Glasses of wine per week    Comment: occassional   Drug use: No   Sexual activity: Not on file  Other Topics Concern   Not on file  Social History Narrative   Lives alone   Social Determinants of Health   Financial Resource Strain: Low Risk  (10/27/2023)   Received from [facility]   Overall Financial Resource Strain (CARDIA)    Difficulty of Paying Living Expenses: Not hard at all  Food Insecurity: No Food Insecurity  (10/27/2023)   Received from [facility]   Hunger Vital Sign    Worried About Running Out of Food in the Last Year: Never true    Ran Out of Food in the Last Year: Never true  Transportation Needs: No Transportation Needs (10/27/2023)   Received from [facility] - Transportation    In the past 12 months, has lack of transportation kept you from medical appointments or from getting medications?: No    Lack of Transportation (Non-Medical): No  Physical Activity: Unknown (06/23/2023)   Exercise Vital Sign    Days of Exercise per Week: 0 days    Minutes of Exercise per Session: Not on file  Stress: No Stress Concern Present (06/23/2023)   [employer] of Occupational [employer] - Occupational Stress Questionnaire    Feeling of Stress : Only a little  Social Connections: Moderately Integrated (06/23/2023)   Social Connection and Isolation Panel [NHANES]    Frequency of Communication with Friends and Family: More than three times a week    Frequency of Social Gatherings with Friends and Family: Three times a week    Attends Religious Services: More than 4 times per year    Active Member of Clubs or Organizations: Not on file    Attends Club or Organization Meetings: More than 4 times per year    Marital Status: Widowed     Review of Systems  Constitutional:  Negative for appetite change and unexpected weight change.  HENT:  Negative for congestion and sinus pressure.   Respiratory:  Negative for cough, chest tightness and shortness of breath.   Cardiovascular:  Negative for chest pain and palpitations.  Gastrointestinal:  Negative for abdominal pain, diarrhea, nausea and vomiting.  Genitourinary:  Negative for difficulty urinating and dysuria.  Musculoskeletal:  Positive for back pain. Negative for joint swelling and myalgias.  Skin:  Negative for color change and rash.  Neurological:  Negative for dizziness and headaches.   Psychiatric/Behavioral:  Negative for agitation and dysphoric mood.        Objective:     BP 128/78   Pulse 82   Temp 98.2 F (36.8 C)   Ht 5\' 5"  (1.651 m)   Wt 190 lb 12.8 oz (86.5 kg)   SpO2 97%   BMI 31.75 kg/m  Wt Readings from Last 3 Encounters:  11/01/23 190 lb 12.8 oz (86.5 kg)  07/18/23 173 lb 3.2 oz (78.6 kg)  06/28/23 177 lb (80.3 kg)    Physical Exam Vitals reviewed.  Constitutional:      General: She is not in acute distress.    Appearance: Normal appearance.  HENT:     Head: Normocephalic and atraumatic.     Right Ear: External ear normal.     Left Ear: External ear normal.     Mouth/Throat:     Pharynx: No oropharyngeal exudate or posterior oropharyngeal erythema.  Eyes:     General: No scleral icterus.       Right eye: No discharge.        Left eye: No discharge.     Conjunctiva/sclera: Conjunctivae normal.  Neck:     Thyroid: No thyromegaly.  Cardiovascular:     Rate and Rhythm: Normal rate and regular rhythm.  Pulmonary:     Effort: No respiratory distress.     Breath sounds: Normal breath sounds. No wheezing.  Abdominal:     General: Bowel sounds are normal.     Palpations: Abdomen is soft.     Tenderness: There is no abdominal tenderness.  Musculoskeletal:        General: No swelling or tenderness.     Cervical back: Neck supple. No tenderness.  Lymphadenopathy:     Cervical: No cervical adenopathy.  Skin:    Findings: No erythema or rash.  Neurological:     Mental Status: She is alert.  Psychiatric:        Mood and Affect: Mood normal.        Behavior: Behavior normal.      Outpatient Encounter Medications as of 11/01/2023  Medication Sig   albuterol (VENTOLIN HFA) 108 (90 Base) MCG/ACT inhaler Inhale 2 puffs into the lungs every 6 (six) hours as needed for wheezing or shortness of breath.   Alirocumab (PRALUENT) 75 MG/ML SOAJ Inject 1 pen (75 mg) into the skin every 14 days.   ALPRAZolam (XANAX) 0.5 MG tablet Take 1 tablet  (0.5 mg total) by mouth daily as needed for anxiety.   aspirin EC 81 MG tablet Take 1 tablet (81 mg total) by mouth daily. Swallow whole.   DULoxetine (CYMBALTA) 60 MG capsule Take 1 capsule (60 mg total) by mouth daily.   HYDROcodone-acetaminophen (NORCO) 7.5-325 MG tablet Take 1-2 tablets by mouth every 6 (six) hours as needed. Using 1-2 times daily   nicotine (NICODERM CQ - DOSED IN MG/24 HOURS) 14 mg/24hr patch Place 1 patch (14 mg total) onto the skin daily.   pregabalin (LYRICA) 75 MG capsule Take 75 mg by mouth 2 (two) times daily.   tirzepatide (ZEPBOUND) 2.5 MG/0.5ML injection vial Inject 2.5 mg into the skin once a week.   [DISCONTINUED] cefdinir (OMNICEF) 300 MG capsule Take 1 capsule (300 mg total) by mouth 2 (two) times daily.   [DISCONTINUED] FLUoxetine (PROZAC) 40 MG capsule Take 1 capsule (40 mg total) by mouth daily.   [DISCONTINUED] hydrochlorothiazide (MICROZIDE) 12.5 MG capsule Take 1 capsule (12.5 mg total) by mouth daily.   [DISCONTINUED] meloxicam (MOBIC) 15 MG tablet Take 1 tablet (15 mg total) by mouth daily.   [DISCONTINUED] pantoprazole (PROTONIX) 40 MG tablet Take 1 tablet (40 mg total) by mouth daily.   [DISCONTINUED] potassium chloride (KLOR-CON) 10 MEQ tablet Take 1 tablet (10 mEq total) by mouth daily.   FLUoxetine (PROZAC) 40 MG capsule Take 1 capsule (40 mg total) by mouth daily.   hydrochlorothiazide (MICROZIDE) 12.5 MG capsule Take 1 capsule (12.5 mg total) by mouth daily.   meloxicam (MOBIC) 15 MG tablet Take 1 tablet (15 mg total) by mouth  daily.   metoprolol succinate (TOPROL-XL) 25 MG 24 hr tablet Take 1 tablet (25 mg total) by mouth daily. Take with or immediately following a meal.   pantoprazole (PROTONIX) 40 MG tablet Take 1 tablet (40 mg total) by mouth daily.   potassium chloride (KLOR-CON) 10 MEQ tablet Take 1 tablet (10 mEq total) by mouth daily.   No facility-administered encounter medications on file as of 11/01/2023.     Lab Results  Component  Value Date   WBC 8.5 03/29/2023   HGB 14.4 03/29/2023   HCT 41.7 03/29/2023   PLT 348.0 03/29/2023   GLUCOSE 69 (L) 10/28/2023   CHOL 233 (H) 10/28/2023   TRIG 253.0 (H) 10/28/2023   HDL 45.60 10/28/2023   LDLDIRECT 150.0 03/25/2023   LDLCALC 136 (H) 10/28/2023   ALT 7 10/28/2023   AST 16 10/28/2023   NA 141 10/28/2023   K 3.5 10/28/2023   CL 104 10/28/2023   CREATININE 0.79 10/28/2023   BUN 21 10/28/2023   CO2 30 10/28/2023   TSH 1.93 12/23/2022   HGBA1C 6.2 10/28/2023    CT LUMBAR SPINE W CONTRAST  Result Date: 08/22/2023 CLINICAL DATA:  Progressively worsening right buttock and diffuse right leg pain to the ankle. Occasional left leg pain. History lumbar fusion in 2018. EXAM: LUMBAR MYELOGRAM CT LUMBAR MYELOGRAM FLUOROSCOPY: Radiation Exposure Index (as provided by the fluoroscopic device): 25.0 mGy Kerma PROCEDURE: After thorough discussion of risks and benefits of the procedure including bleeding, infection, injury to nerves, blood vessels, adjacent structures as well as headache and CSF leak, written and oral informed consent was obtained. Consent was obtained by Dr. Malachi Pro. Time out form was completed. Patient was positioned prone on the fluoroscopy table. Local anesthesia was provided with 1% lidocaine without epinephrine after prepped and draped in the usual sterile fashion. Puncture was performed at L2-L3 using a 3 1/2 inch 22-gauge spinal needle via right interlaminar approach. Using a single pass through the dura, the needle was placed within the thecal sac, with return of clear CSF. 15 mL of Isovue M-200 was injected into the thecal sac, with normal opacification of the nerve roots and cauda equina consistent with free flow within the subarachnoid space. I personally performed the lumbar puncture and administered the intrathecal contrast. I also personally supervised acquisition of the myelogram images. TECHNIQUE: Contiguous axial images were obtained through the lumbar  spine after the intrathecal infusion of contrast. Coronal and sagittal reconstructions were obtained of the axial image sets. COMPARISON:  MRI lumbar spine dated March 08, 2023. FINDINGS: LUMBAR MYELOGRAM FINDINGS: Prior L3-L5 PLIF. 3-4 mm anterolisthesis at L1-L2 when prone and standing increases to 7 mm with flexion. Trace retrolisthesis at L2-L3 without dynamic instability. Small ventral extradural defects at L1-L2, L2-L3, and L5-S1. The L2-L3 defect increases with standing. Mild spinal canal and lateral recess stenosis at L5-S1. CT LUMBAR MYELOGRAM FINDINGS: Segmentation: Standard. Alignment: Unchanged 3 mm anterolisthesis at L1-L2. Vertebrae: Prior L3-L5 PLIF with solid osseous fusion. No evidence of hardware failure or loosening. No acute fracture or other focal pathologic process. Conus medullaris and cauda equina: Conus extends to the L1-L2 level. Conus and cauda equina appear normal. Paraspinal and other soft tissues: Prior cholecystectomy. Aortoiliac atherosclerotic vascular disease. Disc levels: T11-T12: Negative disc. Moderate to severe bilateral facet arthropathy. No stenosis. T12-L1: No significant disc bulge. Moderate bilateral facet arthropathy. No stenosis. L1-L2: Unchanged moderate disc bulging with superimposed small up turning central disc protrusion. Similar moderate bilateral facet arthropathy. Unchanged moderate spinal canal  stenosis with moderate left and mild right lateral recess stenosis. No neuroforaminal stenosis. L2-L3: Unchanged mild disc bulging and mild-to-moderate bilateral facet arthropathy. Unchanged mild left lateral recess stenosis. No spinal canal or neuroforaminal stenosis. L3-L4: Prior posterior decompression and PLIF. No residual stenosis. L4-L5: Prior posterior decompression and PLIF. No residual stenosis. L5-S1: Unchanged moderate disc bulging and severe bilateral facet arthropathy. Similar mild-to-moderate bilateral lateral recess stenosis. Severe right greater than left  neuroforaminal stenosis. No significant spinal canal stenosis. IMPRESSION: 1. Prior L3-L5 PLIF with solid osseous fusion. No hardware complication. 2. Unchanged spondylosis at L1-L2 with mild-to-moderate spinal canal and lateral recess stenosis. Evidence of facet mediated dynamic instability at this level. 3. Unchanged spondylosis at L5-S1 with severe right greater than left neuroforaminal stenosis. 4.  Aortic Atherosclerosis (ICD10-I70.0). Electronically Signed   By: Obie Dredge M.D.   On: 08/22/2023 15:21   DG MYELOGRAPHY LUMBAR INJ LUMBOSACRAL  Result Date: 08/22/2023 CLINICAL DATA:  Progressively worsening right buttock and diffuse right leg pain to the ankle. Occasional left leg pain. History lumbar fusion in 2018. EXAM: LUMBAR MYELOGRAM CT LUMBAR MYELOGRAM FLUOROSCOPY: Radiation Exposure Index (as provided by the fluoroscopic device): 25.0 mGy Kerma PROCEDURE: After thorough discussion of risks and benefits of the procedure including bleeding, infection, injury to nerves, blood vessels, adjacent structures as well as headache and CSF leak, written and oral informed consent was obtained. Consent was obtained by Dr. Malachi Pro. Time out form was completed. Patient was positioned prone on the fluoroscopy table. Local anesthesia was provided with 1% lidocaine without epinephrine after prepped and draped in the usual sterile fashion. Puncture was performed at L2-L3 using a 3 1/2 inch 22-gauge spinal needle via right interlaminar approach. Using a single pass through the dura, the needle was placed within the thecal sac, with return of clear CSF. 15 mL of Isovue M-200 was injected into the thecal sac, with normal opacification of the nerve roots and cauda equina consistent with free flow within the subarachnoid space. I personally performed the lumbar puncture and administered the intrathecal contrast. I also personally supervised acquisition of the myelogram images. TECHNIQUE: Contiguous axial images  were obtained through the lumbar spine after the intrathecal infusion of contrast. Coronal and sagittal reconstructions were obtained of the axial image sets. COMPARISON:  MRI lumbar spine dated March 08, 2023. FINDINGS: LUMBAR MYELOGRAM FINDINGS: Prior L3-L5 PLIF. 3-4 mm anterolisthesis at L1-L2 when prone and standing increases to 7 mm with flexion. Trace retrolisthesis at L2-L3 without dynamic instability. Small ventral extradural defects at L1-L2, L2-L3, and L5-S1. The L2-L3 defect increases with standing. Mild spinal canal and lateral recess stenosis at L5-S1. CT LUMBAR MYELOGRAM FINDINGS: Segmentation: Standard. Alignment: Unchanged 3 mm anterolisthesis at L1-L2. Vertebrae: Prior L3-L5 PLIF with solid osseous fusion. No evidence of hardware failure or loosening. No acute fracture or other focal pathologic process. Conus medullaris and cauda equina: Conus extends to the L1-L2 level. Conus and cauda equina appear normal. Paraspinal and other soft tissues: Prior cholecystectomy. Aortoiliac atherosclerotic vascular disease. Disc levels: T11-T12: Negative disc. Moderate to severe bilateral facet arthropathy. No stenosis. T12-L1: No significant disc bulge. Moderate bilateral facet arthropathy. No stenosis. L1-L2: Unchanged moderate disc bulging with superimposed small up turning central disc protrusion. Similar moderate bilateral facet arthropathy. Unchanged moderate spinal canal stenosis with moderate left and mild right lateral recess stenosis. No neuroforaminal stenosis. L2-L3: Unchanged mild disc bulging and mild-to-moderate bilateral facet arthropathy. Unchanged mild left lateral recess stenosis. No spinal canal or neuroforaminal stenosis. L3-L4: Prior posterior decompression  and PLIF. No residual stenosis. L4-L5: Prior posterior decompression and PLIF. No residual stenosis. L5-S1: Unchanged moderate disc bulging and severe bilateral facet arthropathy. Similar mild-to-moderate bilateral lateral recess stenosis.  Severe right greater than left neuroforaminal stenosis. No significant spinal canal stenosis. IMPRESSION: 1. Prior L3-L5 PLIF with solid osseous fusion. No hardware complication. 2. Unchanged spondylosis at L1-L2 with mild-to-moderate spinal canal and lateral recess stenosis. Evidence of facet mediated dynamic instability at this level. 3. Unchanged spondylosis at L5-S1 with severe right greater than left neuroforaminal stenosis. 4.  Aortic Atherosclerosis (ICD10-I70.0). Electronically Signed   By: Obie Dredge M.D.   On: 08/22/2023 15:21       Assessment & Plan:  Hypercholesterolemia Assessment & Plan: The 10-year ASCVD risk score (Arnett DK, et al., 2019) is: 17.7%   Values used to calculate the score:     Age: 35 years     Sex: Female     Is Non-Hispanic African American: No     Diabetic: No     Tobacco smoker: Yes     Systolic Blood Pressure: 128 mmHg     Is BP treated: Yes     HDL Cholesterol: 45.6 mg/dL     Total Cholesterol: 233 mg/dL  Continues praluent.   Orders: -     Lipid panel; Future -     Hepatic function panel; Future -     Basic metabolic panel; Future -     TSH; Future  Hyperglycemia Assessment & Plan: Low carb diet and exercise.  Follow met b and a1c.  Lab Results  Component Value Date   HGBA1C 6.2 10/28/2023    Orders: -     Hemoglobin A1c; Future  Essential hypertension -     hydroCHLOROthiazide; Take 1 capsule (12.5 mg total) by mouth daily.  Dispense: 90 capsule; Refill: 1 -     Potassium Chloride ER; Take 1 tablet (10 mEq total) by mouth daily.  Dispense: 90 tablet; Refill: 1  Hematuria, unspecified type Assessment & Plan: Recent UTI.  Treated with omnicef.  Doing better.  Will need recheck urinalysis to confirm red blood cells clear.   Orders: -     Urinalysis, Routine w reflex microscopic; Future  Weight loss counseling, encounter for Assessment & Plan: Discussed with her today.  Discussed weight loss options.  Pre diabetes.  She is  interested in GLP 1 agonist. Discussed wegovy previously.  Unable to afford.  Have previously discussed possible side effects.  No issues with pancreas, gallbladder, etc.  She had questions about weight loss surgery.  Discussed trial of zepbound.  Will send in rx to see if more affordable.  Follow.    Lumbar stenosis with neurogenic claudication Assessment & Plan: Has seen Dr Lovell Sheehan. Continue f/u with Dr Yves Dill. Continues on tylenol, norco prn and lyrica.    Primary hypertension Assessment & Plan: Continue hctz and metoprolol. Blood pressure doing well as outlined. Follow pressures.  Follow metabolic panel.    History of colonic polyps Assessment & Plan: Saw Dr Lemar Livings.  Recommended cologuard. Recommended holding colonoscopy until 2025-2026.     Gastroesophageal reflux disease, unspecified whether esophagitis present Assessment & Plan: No upper symptoms reported.  Continue prilosec.     Depression, major, single episode, mild (HCC) Assessment & Plan: Continues on prozac and cymbalta.  She is limiting xanax usage. Follow.    Aortic atherosclerosis (HCC) Assessment & Plan: Continue praluent.    Other orders -     FLUoxetine HCl; Take 1 capsule (40 mg  total) by mouth daily.  Dispense: 90 capsule; Refill: 1 -     Pantoprazole Sodium; Take 1 tablet (40 mg total) by mouth daily.  Dispense: 90 tablet; Refill: 3 -     Tirzepatide-Weight Management; Inject 2.5 mg into the skin once a week.  Dispense: 2 mL; Refill: 2 -     Meloxicam; Take 1 tablet (15 mg total) by mouth daily.  Dispense: 30 tablet; Refill: 1     Dale Ocheyedan, MD

## 2023-11-05 ENCOUNTER — Encounter: Payer: Self-pay | Admitting: Internal Medicine

## 2023-11-05 NOTE — Assessment & Plan Note (Signed)
The 10-year ASCVD risk score (Arnett DK, et al., 2019) is: 17.7%   Values used to calculate the score:     Age: 67 years     Sex: Female     Is Non-Hispanic African American: No     Diabetic: No     Tobacco smoker: Yes     Systolic Blood Pressure: 128 mmHg     Is BP treated: Yes     HDL Cholesterol: 45.6 mg/dL     Total Cholesterol: 233 mg/dL  Continues praluent.

## 2023-11-05 NOTE — Assessment & Plan Note (Signed)
 Continue praluent.

## 2023-11-05 NOTE — Assessment & Plan Note (Signed)
Has seen Dr Lovell Sheehan. Continue f/u with Dr Yves Dill. Continues on tylenol, norco prn and lyrica.

## 2023-11-05 NOTE — Assessment & Plan Note (Signed)
No upper symptoms reported.  Continue prilosec.  

## 2023-11-05 NOTE — Assessment & Plan Note (Signed)
Saw Dr Byrnett.  Recommended cologuard. Recommended holding colonoscopy until 2025-2026.   

## 2023-11-05 NOTE — Assessment & Plan Note (Signed)
Discussed with her today.  Discussed weight loss options.  Pre diabetes.  She is interested in GLP 1 agonist. Discussed wegovy previously.  Unable to afford.  Have previously discussed possible side effects.  No issues with pancreas, gallbladder, etc.  She had questions about weight loss surgery.  Discussed trial of zepbound.  Will send in rx to see if more affordable.  Follow.

## 2023-11-05 NOTE — Assessment & Plan Note (Signed)
Low carb diet and exercise.  Follow met b and a1c.  Lab Results  Component Value Date   HGBA1C 6.2 10/28/2023

## 2023-11-05 NOTE — Assessment & Plan Note (Signed)
Continue hctz and metoprolol. Blood pressure doing well as outlined. Follow pressures.  Follow metabolic panel.

## 2023-11-05 NOTE — Assessment & Plan Note (Signed)
Recent UTI.  Treated with omnicef.  Doing better.  Will need recheck urinalysis to confirm red blood cells clear.

## 2023-11-05 NOTE — Assessment & Plan Note (Signed)
Continues on prozac and cymbalta.  She is limiting xanax usage. Follow.

## 2023-11-15 ENCOUNTER — Other Ambulatory Visit (INDEPENDENT_AMBULATORY_CARE_PROVIDER_SITE_OTHER): Payer: Medicare Other

## 2023-11-15 DIAGNOSIS — E7800 Pure hypercholesterolemia, unspecified: Secondary | ICD-10-CM

## 2023-11-15 DIAGNOSIS — R739 Hyperglycemia, unspecified: Secondary | ICD-10-CM | POA: Diagnosis not present

## 2023-11-15 DIAGNOSIS — R319 Hematuria, unspecified: Secondary | ICD-10-CM

## 2023-11-15 LAB — URINALYSIS, ROUTINE W REFLEX MICROSCOPIC
Bilirubin Urine: NEGATIVE
Ketones, ur: NEGATIVE
Nitrite: NEGATIVE
Specific Gravity, Urine: <=1.005 — AB (ref 1.000–1.030)
Total Protein, Urine: NEGATIVE
Urine Glucose: NEGATIVE
Urobilinogen, UA: 0.2 (ref 0.0–1.0)
pH: 6 (ref 5.0–8.0)

## 2023-11-15 LAB — BASIC METABOLIC PANEL
BUN: 21 mg/dL (ref 6–23)
CO2: 29 meq/L (ref 19–32)
Calcium: 9.1 mg/dL (ref 8.4–10.5)
Chloride: 102 meq/L (ref 96–112)
Creatinine, Ser: 0.8 mg/dL (ref 0.40–1.20)
GFR: 76.05 mL/min (ref >60.00)
Glucose, Bld: 76 mg/dL (ref 70–99)
Potassium: 3.4 meq/L — ABNORMAL LOW (ref 3.5–5.1)
Sodium: 140 meq/L (ref 135–145)

## 2023-11-15 LAB — LIPID PANEL
Cholesterol: 238 mg/dL — ABNORMAL HIGH (ref 0–200)
HDL: 50.4 mg/dL (ref >39.00)
LDL Cholesterol: 119 mg/dL — ABNORMAL HIGH (ref 0–99)
NonHDL: 187.86
Total CHOL/HDL Ratio: 5
Triglycerides: 343 mg/dL — ABNORMAL HIGH (ref 0.0–149.0)
VLDL: 68.6 mg/dL — ABNORMAL HIGH (ref 0.0–40.0)

## 2023-11-15 LAB — HEMOGLOBIN A1C: Hgb A1c MFr Bld: 6.1 % (ref 4.6–6.5)

## 2023-11-15 LAB — HEPATIC FUNCTION PANEL
ALT: 8 U/L (ref 0–35)
AST: 19 U/L (ref 0–37)
Albumin: 4.2 g/dL (ref 3.5–5.2)
Alkaline Phosphatase: 108 U/L (ref 39–117)
Bilirubin, Direct: 0.1 mg/dL (ref 0.0–0.3)
Total Bilirubin: 0.5 mg/dL (ref 0.2–1.2)
Total Protein: 7 g/dL (ref 6.0–8.3)

## 2023-11-15 LAB — TSH: TSH: 0.72 u[IU]/mL (ref 0.35–5.50)

## 2023-11-25 ENCOUNTER — Other Ambulatory Visit: Payer: Self-pay | Admitting: *Deleted

## 2023-11-25 DIAGNOSIS — E876 Hypokalemia: Secondary | ICD-10-CM

## 2023-12-02 ENCOUNTER — Other Ambulatory Visit (INDEPENDENT_AMBULATORY_CARE_PROVIDER_SITE_OTHER): Payer: Medicare Other

## 2023-12-02 DIAGNOSIS — E876 Hypokalemia: Secondary | ICD-10-CM

## 2023-12-02 LAB — POTASSIUM: Potassium: 3.4 meq/L — ABNORMAL LOW (ref 3.5–5.1)

## 2023-12-05 ENCOUNTER — Telehealth: Payer: Self-pay

## 2023-12-05 ENCOUNTER — Other Ambulatory Visit: Payer: Self-pay | Admitting: Internal Medicine

## 2023-12-05 NOTE — Telephone Encounter (Signed)
Copied from CRM [id number]. Topic: Clinical - Lab/Test Results >> Dec 05, 2023  2:15 PM Isabell A wrote: Reason for CRM: Patient called to discuss lab results, advised patient "potassium is stable, but still slightly decreased. Given persistent slightly decreased, see if she would be agreeable to come in for appt to discuss possibly changing medication - to help with her potassium." As per Dr.Scott's note. Patient states she will discuss with doctor on her next upcoming appointment for her physical.

## 2023-12-06 NOTE — Telephone Encounter (Signed)
Per discussion with Erie Noe, this has been taken care of.

## 2023-12-07 NOTE — Progress Notes (Signed)
 Subjective:    Patient ID: Angel Riddle, female    DOB: 03-Mar-1956, 68 y.o.   MRN: [id number]  Patient here for  Chief Complaint  Patient presents with   Medication Management    DISCUSS BP MEDICATION    HPI Here for a work in appt - work in to discuss recent low potassium and to discuss possibility of changing her blood pressure medication.  She is currently taking hydrochlorothiazide . Potassium low - having to take potassium supplements.  Discussed - her current blood pressure medication. Discussed changing hydrochlorothiazide  to triam/hydrochlorothiazide  and decreasing potassium supplements. She reports that on 12/26, she started with increased chest congestion and head congestion. Some sinus congestion. Clear mucus. Increased drainage. No fever. Persistent hip and leg pain. No body aches. Increased cough - productive of light yellow mucus. Some wheezing. No vomiting or diarrhea. Took theraflu.    Past Medical History:  Diagnosis Date   Allergy    Anal fissure    Anxiety    a.) on BZO (alprazolam ) PRN   Aortic atherosclerosis (HCC)    Arthritis    B12 deficiency    Basal cell carcinoma (BCC) of skin of nose    Chronic back pain    Degenerative disc disease, lumbar    Depression    DOE (dyspnea on exertion)    Female bladder prolapse    Gallstones    GERD (gastroesophageal reflux disease)    Heart murmur    Hx of colonic polyp    Hx: UTI (urinary tract infection)    Hypercholesterolemia    Hypertension    Long term current use of aspirin     Lumbar stenosis    Panic attacks    Wears partial dentures    Past Surgical History:  Procedure Laterality Date   ABDOMINAL HYSTERECTOMY  1992   partial   CARPOMETACARPAL (CMC) FUSION OF THUMB Left 05/25/2023   Procedure: SUSPENSION ARTHROPLASTY OF LEFT THUMB CMC JOINT;  Surgeon: Edie Norleen PARAS, MD;  Location: [facility];  Service: Orthopedics;  Laterality: Left;   CATARACT EXTRACTION Bilateral [id number]   CHOLECYSTECTOMY   04/19/2014   COLONOSCOPY  07/02/2011   DR. Byrnett   COLONOSCOPY WITH PROPOFOL  N/A 09/25/2015   Procedure: COLONOSCOPY WITH PROPOFOL ;  Surgeon: Rogelia Copping, MD;  Location: [facility];  Service: Endoscopy;  Laterality: N/A;   FOOT SURGERY Right 2009   LUMBAR FUSION  2018   MOUTH SURGERY     dental implants, veneers   POLYPECTOMY  09/25/2015   Procedure: POLYPECTOMY;  Surgeon: Rogelia Copping, MD;  Location: [facility];  Service: Endoscopy;;   skin surgery Left 08/27/2013   Basal cell removal-left nostril   TONSILLECTOMY AND ADENOIDECTOMY  1969   Family History  Problem Relation Age of Onset   Hyperlipidemia Mother    Hypertension Mother    Alcohol abuse Father    Hyperlipidemia Father    Heart disease Father    Diabetes Father    Hyperlipidemia Sister    Lung cancer Brother    Hyperlipidemia Brother    Breast cancer Neg Hx    Social History   Socioeconomic History   Marital status: Widowed    Spouse name: Not on file   Number of children: 2   Years of education: Not on file   Highest education level: Some college, no degree  Occupational History   Not on file  Tobacco Use   Smoking status: Every Day    Current packs/day: 0.00  Average packs/day: 1 pack/day for 37.0 years (37.0 ttl pk-yrs)    Types: Cigarettes    Start date: 08/07/2019    Last attempt to quit: 05/25/2021    Years since quitting: 2.5   Smokeless tobacco: Never   Tobacco comments:    Smokes 1/2 PPD   Vaping Use   Vaping status: Former  Substance and Sexual Activity   Alcohol use: Yes    Alcohol/week: 1.0 standard drink of alcohol    Types: 1 Glasses of wine per week    Comment: occassional   Drug use: No   Sexual activity: Not on file  Other Topics Concern   Not on file  Social History Narrative   Lives alone   Social Drivers of Health   Financial Resource Strain: Low Risk  (10/27/2023)   Received from [facility]   Overall Financial Resource Strain  (CARDIA)    Difficulty of Paying Living Expenses: Not hard at all  Food Insecurity: No Food Insecurity (10/27/2023)   Received from [facility]   Hunger Vital Sign    Worried About Running Out of Food in the Last Year: Never true    Ran Out of Food in the Last Year: Never true  Transportation Needs: No Transportation Needs (10/27/2023)   Received from [facility] - Transportation    In the past 12 months, has lack of transportation kept you from medical appointments or from getting medications?: No    Lack of Transportation (Non-Medical): No  Physical Activity: Unknown (06/23/2023)   Exercise Vital Sign    Days of Exercise per Week: 0 days    Minutes of Exercise per Session: Not on file  Stress: No Stress Concern Present (06/23/2023)   [employer] of Occupational [employer] - Occupational Stress Questionnaire    Feeling of Stress : Only a little  Social Connections: Moderately Integrated (06/23/2023)   Social Connection and Isolation Panel [NHANES]    Frequency of Communication with Friends and Family: More than three times a week    Frequency of Social Gatherings with Friends and Family: Three times a week    Attends Religious Services: More than 4 times per year    Active Member of Clubs or Organizations: Not on file    Attends Club or Organization Meetings: More than 4 times per year    Marital Status: Widowed     Review of Systems  Constitutional:  Negative for appetite change and fever.  HENT:  Positive for congestion, postnasal drip and sinus pressure.   Respiratory:  Positive for cough and wheezing. Negative for chest tightness and shortness of breath.   Cardiovascular:  Negative for chest pain and palpitations.  Gastrointestinal:  Negative for abdominal pain, diarrhea, nausea and vomiting.  Genitourinary:  Negative for difficulty urinating and dysuria.  Musculoskeletal:        Hip and leg pain as outlined.   Skin:  Negative  for color change and rash.  Neurological:  Negative for dizziness and headaches.  Psychiatric/Behavioral:  Negative for agitation and dysphoric mood.        Objective:     BP 110/74   Pulse 60   Temp 97.9 F (36.6 C)   Resp 16   Ht 5' 5 (1.651 m)   Wt 186 lb (84.4 kg)   SpO2 97%   BMI 30.95 kg/m  Wt Readings from Last 3 Encounters:  12/08/23 186 lb (84.4 kg)  11/01/23 190 lb 12.8 oz (  86.5 kg)  07/18/23 173 lb 3.2 oz (78.6 kg)    Physical Exam Vitals reviewed.  Constitutional:      General: She is not in acute distress.    Appearance: Normal appearance.  HENT:     Head: Normocephalic and atraumatic.     Right Ear: External ear normal.     Left Ear: External ear normal.     Mouth/Throat:     Pharynx: No oropharyngeal exudate or posterior oropharyngeal erythema.  Eyes:     General: No scleral icterus.       Right eye: No discharge.        Left eye: No discharge.     Conjunctiva/sclera: Conjunctivae normal.  Neck:     Thyroid : No thyromegaly.  Cardiovascular:     Rate and Rhythm: Normal rate and regular rhythm.  Pulmonary:     Effort: No respiratory distress.     Breath sounds: Normal breath sounds. No wheezing.     Comments: Increased cough.  Abdominal:     General: Bowel sounds are normal.     Palpations: Abdomen is soft.     Tenderness: There is no abdominal tenderness.  Musculoskeletal:        General: No swelling or tenderness.     Cervical back: Neck supple. No tenderness.  Lymphadenopathy:     Cervical: No cervical adenopathy.  Skin:    Findings: No erythema or rash.  Neurological:     Mental Status: She is alert.  Psychiatric:        Mood and Affect: Mood normal.        Behavior: Behavior normal.      Outpatient Encounter Medications as of 12/08/2023  Medication Sig   amoxicillin -clavulanate (AUGMENTIN ) 875-125 MG tablet Take 1 tablet by mouth 2 (two) times daily.   predniSONE  (DELTASONE ) 10 MG tablet Take 6 tablets x 1 day and then decrease  by 1/2 tablet per day until down to zero mg.   triamterene -hydrochlorothiazide  (MAXZIDE -25) 37.5-25 MG tablet 1/2 tablet per day   albuterol  (VENTOLIN  HFA) 108 (90 Base) MCG/ACT inhaler Inhale 2 puffs into the lungs every 6 (six) hours as needed for wheezing or shortness of breath.   Alirocumab  (PRALUENT ) 75 MG/ML SOAJ Inject 1 pen (75 mg) into the skin every 14 days.   ALPRAZolam  (XANAX ) 0.5 MG tablet Take 1 tablet (0.5 mg total) by mouth daily as needed for anxiety.   aspirin  EC 81 MG tablet Take 1 tablet (81 mg total) by mouth daily. Swallow whole.   DULoxetine  (CYMBALTA ) 60 MG capsule Take 1 capsule (60 mg total) by mouth daily.   FLUoxetine  (PROZAC ) 40 MG capsule Take 1 capsule (40 mg total) by mouth daily.   HYDROcodone -acetaminophen  (NORCO) 7.5-325 MG tablet Take 1-2 tablets by mouth every 6 (six) hours as needed. Using 1-2 times daily   meloxicam  (MOBIC ) 15 MG tablet Take 1 tablet (15 mg total) by mouth daily.   metoprolol  succinate (TOPROL -XL) 25 MG 24 hr tablet Take 1 tablet (25 mg total) by mouth daily. Take with or immediately following a meal.   pantoprazole  (PROTONIX ) 40 MG tablet Take 1 tablet (40 mg total) by mouth daily.   potassium chloride  (KLOR-CON ) 10 MEQ tablet Take 1 tablet (10 mEq total) by mouth daily.   pregabalin  (LYRICA ) 75 MG capsule Take 75 mg by mouth 2 (two) times daily.   [DISCONTINUED] hydrochlorothiazide  (MICROZIDE ) 12.5 MG capsule Take 1 capsule (12.5 mg total) by mouth daily.   [DISCONTINUED] nicotine  (NICODERM CQ  -  DOSED IN MG/24 HOURS) 14 mg/24hr patch Place 1 patch (14 mg total) onto the skin daily.   [DISCONTINUED] tirzepatide  (ZEPBOUND ) 2.5 MG/0.5ML injection vial Inject 2.5 mg into the skin once a week.   No facility-administered encounter medications on file as of 12/08/2023.     Lab Results  Component Value Date   WBC 8.5 03/29/2023   HGB 14.4 03/29/2023   HCT 41.7 03/29/2023   PLT 348.0 03/29/2023   GLUCOSE 76 11/15/2023   CHOL 238 (H)  11/15/2023   TRIG 343.0 (H) 11/15/2023   HDL 50.40 11/15/2023   LDLDIRECT 150.0 03/25/2023   LDLCALC 119 (H) 11/15/2023   ALT 8 11/15/2023   AST 19 11/15/2023   NA 140 11/15/2023   K 3.4 (L) 12/02/2023   CL 102 11/15/2023   CREATININE 0.80 11/15/2023   BUN 21 11/15/2023   CO2 29 11/15/2023   TSH 0.72 11/15/2023   HGBA1C 6.1 11/15/2023    CT LUMBAR SPINE W CONTRAST Result Date: 08/22/2023 CLINICAL DATA:  Progressively worsening right buttock and diffuse right leg pain to the ankle. Occasional left leg pain. History lumbar fusion in 2018. EXAM: LUMBAR MYELOGRAM CT LUMBAR MYELOGRAM FLUOROSCOPY: Radiation Exposure Index (as provided by the fluoroscopic device): 25.0 mGy Kerma PROCEDURE: After thorough discussion of risks and benefits of the procedure including bleeding, infection, injury to nerves, blood vessels, adjacent structures as well as headache and CSF leak, written and oral informed consent was obtained. Consent was obtained by Dr. Elsie Shoulder. Time out form was completed. Patient was positioned prone on the fluoroscopy table. Local anesthesia was provided with 1% lidocaine  without epinephrine  after prepped and draped in the usual sterile fashion. Puncture was performed at L2-L3 using a 3 1/2 inch 22-gauge spinal needle via right interlaminar approach. Using a single pass through the dura, the needle was placed within the thecal sac, with return of clear CSF. 15 mL of Isovue  M-200 was injected into the thecal sac, with normal opacification of the nerve roots and cauda equina consistent with free flow within the subarachnoid space. I personally performed the lumbar puncture and administered the intrathecal contrast. I also personally supervised acquisition of the myelogram images. TECHNIQUE: Contiguous axial images were obtained through the lumbar spine after the intrathecal infusion of contrast. Coronal and sagittal reconstructions were obtained of the axial image sets. COMPARISON:  MRI  lumbar spine dated March 08, 2023. FINDINGS: LUMBAR MYELOGRAM FINDINGS: Prior L3-L5 PLIF. 3-4 mm anterolisthesis at L1-L2 when prone and standing increases to 7 mm with flexion. Trace retrolisthesis at L2-L3 without dynamic instability. Small ventral extradural defects at L1-L2, L2-L3, and L5-S1. The L2-L3 defect increases with standing. Mild spinal canal and lateral recess stenosis at L5-S1. CT LUMBAR MYELOGRAM FINDINGS: Segmentation: Standard. Alignment: Unchanged 3 mm anterolisthesis at L1-L2. Vertebrae: Prior L3-L5 PLIF with solid osseous fusion. No evidence of hardware failure or loosening. No acute fracture or other focal pathologic process. Conus medullaris and cauda equina: Conus extends to the L1-L2 level. Conus and cauda equina appear normal. Paraspinal and other soft tissues: Prior cholecystectomy. Aortoiliac atherosclerotic vascular disease. Disc levels: T11-T12: Negative disc. Moderate to severe bilateral facet arthropathy. No stenosis. T12-L1: No significant disc bulge. Moderate bilateral facet arthropathy. No stenosis. L1-L2: Unchanged moderate disc bulging with superimposed small up turning central disc protrusion. Similar moderate bilateral facet arthropathy. Unchanged moderate spinal canal stenosis with moderate left and mild right lateral recess stenosis. No neuroforaminal stenosis. L2-L3: Unchanged mild disc bulging and mild-to-moderate bilateral facet arthropathy. Unchanged mild left  lateral recess stenosis. No spinal canal or neuroforaminal stenosis. L3-L4: Prior posterior decompression and PLIF. No residual stenosis. L4-L5: Prior posterior decompression and PLIF. No residual stenosis. L5-S1: Unchanged moderate disc bulging and severe bilateral facet arthropathy. Similar mild-to-moderate bilateral lateral recess stenosis. Severe right greater than left neuroforaminal stenosis. No significant spinal canal stenosis. IMPRESSION: 1. Prior L3-L5 PLIF with solid osseous fusion. No hardware  complication. 2. Unchanged spondylosis at L1-L2 with mild-to-moderate spinal canal and lateral recess stenosis. Evidence of facet mediated dynamic instability at this level. 3. Unchanged spondylosis at L5-S1 with severe right greater than left neuroforaminal stenosis. 4.  Aortic Atherosclerosis (ICD10-I70.0). Electronically Signed   By: Elsie ONEIDA Shoulder M.D.   On: 08/22/2023 15:21   DG MYELOGRAPHY LUMBAR INJ LUMBOSACRAL Result Date: 08/22/2023 CLINICAL DATA:  Progressively worsening right buttock and diffuse right leg pain to the ankle. Occasional left leg pain. History lumbar fusion in 2018. EXAM: LUMBAR MYELOGRAM CT LUMBAR MYELOGRAM FLUOROSCOPY: Radiation Exposure Index (as provided by the fluoroscopic device): 25.0 mGy Kerma PROCEDURE: After thorough discussion of risks and benefits of the procedure including bleeding, infection, injury to nerves, blood vessels, adjacent structures as well as headache and CSF leak, written and oral informed consent was obtained. Consent was obtained by Dr. Elsie Shoulder. Time out form was completed. Patient was positioned prone on the fluoroscopy table. Local anesthesia was provided with 1% lidocaine  without epinephrine  after prepped and draped in the usual sterile fashion. Puncture was performed at L2-L3 using a 3 1/2 inch 22-gauge spinal needle via right interlaminar approach. Using a single pass through the dura, the needle was placed within the thecal sac, with return of clear CSF. 15 mL of Isovue  M-200 was injected into the thecal sac, with normal opacification of the nerve roots and cauda equina consistent with free flow within the subarachnoid space. I personally performed the lumbar puncture and administered the intrathecal contrast. I also personally supervised acquisition of the myelogram images. TECHNIQUE: Contiguous axial images were obtained through the lumbar spine after the intrathecal infusion of contrast. Coronal and sagittal reconstructions were obtained of  the axial image sets. COMPARISON:  MRI lumbar spine dated March 08, 2023. FINDINGS: LUMBAR MYELOGRAM FINDINGS: Prior L3-L5 PLIF. 3-4 mm anterolisthesis at L1-L2 when prone and standing increases to 7 mm with flexion. Trace retrolisthesis at L2-L3 without dynamic instability. Small ventral extradural defects at L1-L2, L2-L3, and L5-S1. The L2-L3 defect increases with standing. Mild spinal canal and lateral recess stenosis at L5-S1. CT LUMBAR MYELOGRAM FINDINGS: Segmentation: Standard. Alignment: Unchanged 3 mm anterolisthesis at L1-L2. Vertebrae: Prior L3-L5 PLIF with solid osseous fusion. No evidence of hardware failure or loosening. No acute fracture or other focal pathologic process. Conus medullaris and cauda equina: Conus extends to the L1-L2 level. Conus and cauda equina appear normal. Paraspinal and other soft tissues: Prior cholecystectomy. Aortoiliac atherosclerotic vascular disease. Disc levels: T11-T12: Negative disc. Moderate to severe bilateral facet arthropathy. No stenosis. T12-L1: No significant disc bulge. Moderate bilateral facet arthropathy. No stenosis. L1-L2: Unchanged moderate disc bulging with superimposed small up turning central disc protrusion. Similar moderate bilateral facet arthropathy. Unchanged moderate spinal canal stenosis with moderate left and mild right lateral recess stenosis. No neuroforaminal stenosis. L2-L3: Unchanged mild disc bulging and mild-to-moderate bilateral facet arthropathy. Unchanged mild left lateral recess stenosis. No spinal canal or neuroforaminal stenosis. L3-L4: Prior posterior decompression and PLIF. No residual stenosis. L4-L5: Prior posterior decompression and PLIF. No residual stenosis. L5-S1: Unchanged moderate disc bulging and severe bilateral facet arthropathy. Similar mild-to-moderate bilateral  lateral recess stenosis. Severe right greater than left neuroforaminal stenosis. No significant spinal canal stenosis. IMPRESSION: 1. Prior L3-L5 PLIF with solid  osseous fusion. No hardware complication. 2. Unchanged spondylosis at L1-L2 with mild-to-moderate spinal canal and lateral recess stenosis. Evidence of facet mediated dynamic instability at this level. 3. Unchanged spondylosis at L5-S1 with severe right greater than left neuroforaminal stenosis. 4.  Aortic Atherosclerosis (ICD10-I70.0). Electronically Signed   By: Elsie ONEIDA Shoulder M.D.   On: 08/22/2023 15:21       Assessment & Plan:  Primary hypertension Assessment & Plan: Continue metoprolol . On hydrochlorothiazide . Recent issues with low potassium. Will stop the hydrochlorothiazide . Start triam/hydrochlorothiazide  1/2 tablet q day. Decrease potassium to q day. Blood pressure doing well as outlined. Follow pressures.  Follow metabolic panel.   Orders: -     Basic metabolic panel; Future  Acute cough -     DG Chest 2 View; Future  Anxiety Assessment & Plan: On cymbalta  and prozac .   Follow closely.  Previously tried buspar  and gabapentin  - did not feel helped.  Has xanax  .5mg  to use sparingly.    Aortic atherosclerosis (HCC) Assessment & Plan: Continue praluent .    Depression, major, single episode, mild (HCC) Assessment & Plan: Continues on prozac  and cymbalta .  She is limiting xanax  usage. Follow.    Gastroesophageal reflux disease, unspecified whether esophagitis present Assessment & Plan: No upper symptoms reported.  Continue prilosec.     History of colonic polyps Assessment & Plan: Saw Dr Dessa.  Recommended holding colonoscopy until 2025-2026.     Hypercholesterolemia Assessment & Plan: The 10-year ASCVD risk score (Arnett DK, et al., 2019) is: 13.1%   Values used to calculate the score:     Age: 33 years     Sex: Female     Is Non-Hispanic African American: No     Diabetic: No     Tobacco smoker: Yes     Systolic Blood Pressure: 110 mmHg     Is BP treated: Yes     HDL Cholesterol: 50.4 mg/dL     Total Cholesterol: 238 mg/dL  Continues praluent .     Hyperglycemia Assessment & Plan: Low carb diet and exercise.  Follow met b and a1c.  Lab Results  Component Value Date   HGBA1C 6.1 11/15/2023     Upper respiratory tract infection, unspecified type Assessment & Plan: Increased cough and congestion as outlined. Treat with nasacort nasal spray and mucinex prn. Augmentin . Prednisone  taper. Albuterol  prn. Follow.  Call with update.    Other orders -     Triamterene -HCTZ; 1/2 tablet per day  Dispense: 30 tablet; Refill: 1 -     predniSONE ; Take 6 tablets x 1 day and then decrease by 1/2 tablet per day until down to zero mg.  Dispense: 39 tablet; Refill: 0 -     Amoxicillin -Pot Clavulanate; Take 1 tablet by mouth 2 (two) times daily.  Dispense: 14 tablet; Refill: 0     Allena Hamilton, MD

## 2023-12-08 ENCOUNTER — Encounter: Payer: Self-pay | Admitting: Internal Medicine

## 2023-12-08 ENCOUNTER — Telehealth: Payer: Self-pay | Admitting: *Deleted

## 2023-12-08 ENCOUNTER — Ambulatory Visit: Payer: PPO

## 2023-12-08 ENCOUNTER — Ambulatory Visit (INDEPENDENT_AMBULATORY_CARE_PROVIDER_SITE_OTHER): Payer: PPO | Admitting: Internal Medicine

## 2023-12-08 VITALS — BP 110/74 | HR 60 | Temp 97.9°F | Resp 16 | Ht 65.0 in | Wt 186.0 lb

## 2023-12-08 DIAGNOSIS — Z860100 Personal history of colon polyps, unspecified: Secondary | ICD-10-CM | POA: Diagnosis not present

## 2023-12-08 DIAGNOSIS — F419 Anxiety disorder, unspecified: Secondary | ICD-10-CM | POA: Diagnosis not present

## 2023-12-08 DIAGNOSIS — R059 Cough, unspecified: Secondary | ICD-10-CM | POA: Diagnosis not present

## 2023-12-08 DIAGNOSIS — R062 Wheezing: Secondary | ICD-10-CM | POA: Diagnosis not present

## 2023-12-08 DIAGNOSIS — K219 Gastro-esophageal reflux disease without esophagitis: Secondary | ICD-10-CM

## 2023-12-08 DIAGNOSIS — F320 Major depressive disorder, single episode, mild: Secondary | ICD-10-CM | POA: Diagnosis not present

## 2023-12-08 DIAGNOSIS — R739 Hyperglycemia, unspecified: Secondary | ICD-10-CM

## 2023-12-08 DIAGNOSIS — I700 Atherosclerosis of aorta: Secondary | ICD-10-CM | POA: Diagnosis not present

## 2023-12-08 DIAGNOSIS — R051 Acute cough: Secondary | ICD-10-CM | POA: Diagnosis not present

## 2023-12-08 DIAGNOSIS — J069 Acute upper respiratory infection, unspecified: Secondary | ICD-10-CM

## 2023-12-08 DIAGNOSIS — E7800 Pure hypercholesterolemia, unspecified: Secondary | ICD-10-CM

## 2023-12-08 DIAGNOSIS — I10 Essential (primary) hypertension: Secondary | ICD-10-CM

## 2023-12-08 MED ORDER — PREDNISONE 10 MG PO TABS: ORAL_TABLET | ORAL | 0 refills | Status: DC

## 2023-12-08 MED ORDER — TRIAMTERENE-HCTZ 37.5-25 MG PO TABS: ORAL_TABLET | ORAL | 1 refills | Status: DC

## 2023-12-08 MED ORDER — AMOXICILLIN-POT CLAVULANATE 875-125 MG PO TABS
1.0000 | ORAL_TABLET | Freq: Two times a day (BID) | ORAL | 0 refills | Status: DC
Start: 2023-12-08 — End: 2024-01-01

## 2023-12-08 NOTE — Telephone Encounter (Signed)
 See result note.

## 2023-12-08 NOTE — Telephone Encounter (Signed)
 FYI for you. I told her about her cxr. She wanted you to see the message about the inhaler. She says you wanted to know the name of it. She felt like it helped her in the past

## 2023-12-08 NOTE — Patient Instructions (Addendum)
 Nasacort nasal spray - 2 sprays each nostril one time per day.    Mucinex - daily   Take a probiotic daily while you are on the antibiotics and for two weeks after you complete the antibiotic.   Stop hydrochlorothiazide   Decrease potassium to one pill per day  Start triam/hydrochlorothiazide  - 1/2 tablet per day.

## 2023-12-08 NOTE — Telephone Encounter (Signed)
 Copied from CRM [id number]. Topic: General - Other >> Dec 08, 2023  3:53 PM Susanna ORN wrote: Reason for CRM: Patient states she saw Dr. Glendia this morning. Says Dr. Glendia told her to call her about in regards to some inhaler medication that her husband had before he passed. Patient states the medication is ipratropium bromide 0.5 mg & albuterol  sulfate 3 mg. Patient states she also had chest x-rays done this morning as well & has not heard anything back. Worried that she may not catch the pharmacy today if Dr. Glendia is going to prescribe something else pending results of chest x-rays. Please give pt a call back at [phone].

## 2023-12-10 ENCOUNTER — Other Ambulatory Visit (HOSPITAL_COMMUNITY): Payer: Self-pay

## 2023-12-11 ENCOUNTER — Encounter: Payer: Self-pay | Admitting: Internal Medicine

## 2023-12-11 DIAGNOSIS — J069 Acute upper respiratory infection, unspecified: Secondary | ICD-10-CM | POA: Insufficient documentation

## 2023-12-11 NOTE — Assessment & Plan Note (Signed)
 The 10-year ASCVD risk score (Arnett DK, et al., 2019) is: 13.1%   Values used to calculate the score:     Age: 68 years     Sex: Female     Is Non-Hispanic African American: No     Diabetic: No     Tobacco smoker: Yes     Systolic Blood Pressure: 110 mmHg     Is BP treated: Yes     HDL Cholesterol: 50.4 mg/dL     Total Cholesterol: 238 mg/dL  Continues praluent .

## 2023-12-11 NOTE — Assessment & Plan Note (Signed)
Saw Dr Bary Castilla.  Recommended holding colonoscopy until 2025-2026.

## 2023-12-11 NOTE — Assessment & Plan Note (Addendum)
 Continue metoprolol . On hydrochlorothiazide . Recent issues with low potassium. Will stop the hydrochlorothiazide . Start triam/hydrochlorothiazide  1/2 tablet q day. Decrease potassium to q day. Blood pressure doing well as outlined. Follow pressures.  Follow metabolic panel.

## 2023-12-11 NOTE — Assessment & Plan Note (Signed)
On cymbalta and prozac.   Follow closely.  Previously tried buspar and gabapentin - did not feel helped.  Has xanax .5mg  to use sparingly.

## 2023-12-11 NOTE — Assessment & Plan Note (Signed)
No upper symptoms reported.  Continue prilosec.  

## 2023-12-11 NOTE — Assessment & Plan Note (Signed)
 Continue praluent.

## 2023-12-11 NOTE — Assessment & Plan Note (Signed)
 Low carb diet and exercise.  Follow met b and a1c.  Lab Results  Component Value Date   HGBA1C 6.1 11/15/2023

## 2023-12-11 NOTE — Assessment & Plan Note (Signed)
Continues on prozac and cymbalta.  She is limiting xanax usage. Follow.

## 2023-12-11 NOTE — Assessment & Plan Note (Signed)
 Increased cough and congestion as outlined. Treat with nasacort nasal spray and mucinex prn. Augmentin. Prednisone taper. Albuterol prn. Follow.  Call with update.

## 2023-12-12 ENCOUNTER — Encounter: Payer: Self-pay | Admitting: Internal Medicine

## 2023-12-12 NOTE — Telephone Encounter (Signed)
 If she has been off for three weeks and doing well off, recommend remaining off.  Let us know if any problems.

## 2023-12-15 DIAGNOSIS — M5416 Radiculopathy, lumbar region: Secondary | ICD-10-CM | POA: Diagnosis not present

## 2023-12-15 DIAGNOSIS — M51362 Other intervertebral disc degeneration, lumbar region with discogenic back pain and lower extremity pain: Secondary | ICD-10-CM | POA: Diagnosis not present

## 2023-12-15 DIAGNOSIS — M5380 Other specified dorsopathies, site unspecified: Secondary | ICD-10-CM | POA: Diagnosis not present

## 2023-12-15 DIAGNOSIS — M48062 Spinal stenosis, lumbar region with neurogenic claudication: Secondary | ICD-10-CM | POA: Diagnosis not present

## 2023-12-19 ENCOUNTER — Other Ambulatory Visit (HOSPITAL_COMMUNITY): Payer: Self-pay

## 2023-12-19 ENCOUNTER — Telehealth: Payer: Self-pay | Admitting: Internal Medicine

## 2023-12-19 NOTE — Telephone Encounter (Signed)
 Called patient to let her know that she has routine labs in about 4 weeks and potassium will be checked then. Just wanted to confirm that this is ok with you.

## 2023-12-19 NOTE — Telephone Encounter (Signed)
 Copied from CRM [id number]. Topic: Appointments - Appointment Scheduling >> Dec 19, 2023 10:51 AM Samuel Jester B wrote: Pt would like a callback to confirm if she would need to come back in for an appointment to have her potasium checked.

## 2023-12-19 NOTE — Telephone Encounter (Signed)
 Since I changed her medication, I would like to check a met b in one one week.

## 2023-12-20 ENCOUNTER — Other Ambulatory Visit: Payer: Self-pay

## 2023-12-20 NOTE — Telephone Encounter (Signed)
 Lab scheduled

## 2023-12-22 DIAGNOSIS — M48062 Spinal stenosis, lumbar region with neurogenic claudication: Secondary | ICD-10-CM | POA: Diagnosis not present

## 2023-12-22 DIAGNOSIS — M5416 Radiculopathy, lumbar region: Secondary | ICD-10-CM | POA: Diagnosis not present

## 2023-12-22 DIAGNOSIS — M7918 Myalgia, other site: Secondary | ICD-10-CM | POA: Diagnosis not present

## 2023-12-23 ENCOUNTER — Other Ambulatory Visit: Payer: PPO

## 2023-12-27 ENCOUNTER — Telehealth: Payer: Self-pay

## 2023-12-27 NOTE — Telephone Encounter (Signed)
Copied from CRM [id number]. Topic: Clinical - Lab/Test Results >> Dec 27, 2023  2:26 PM Florestine Avers wrote: Reason for CRM: Patient called in requesting a call back to go over her most recent labs.

## 2023-12-28 ENCOUNTER — Other Ambulatory Visit: Payer: PPO

## 2023-12-28 NOTE — Telephone Encounter (Signed)
Pt scheduled for met b. She did not show for 1/17 appt

## 2023-12-31 ENCOUNTER — Other Ambulatory Visit: Payer: Self-pay | Admitting: Internal Medicine

## 2024-01-01 ENCOUNTER — Other Ambulatory Visit: Payer: Self-pay

## 2024-01-01 ENCOUNTER — Encounter: Payer: Self-pay | Admitting: Emergency Medicine

## 2024-01-01 ENCOUNTER — Emergency Department
Admission: EM | Admit: 2024-01-01 | Discharge: 2024-01-01 | Disposition: A | Discharge status: Home / Self Care | Payer: PPO | Attending: Emergency Medicine | Admitting: Emergency Medicine

## 2024-01-01 DIAGNOSIS — W1839XA Other fall on same level, initial encounter: Secondary | ICD-10-CM | POA: Diagnosis not present

## 2024-01-01 DIAGNOSIS — G8929 Other chronic pain: Secondary | ICD-10-CM | POA: Diagnosis not present

## 2024-01-01 DIAGNOSIS — M545 Low back pain, unspecified: Secondary | ICD-10-CM | POA: Diagnosis not present

## 2024-01-01 DIAGNOSIS — M5459 Other low back pain: Secondary | ICD-10-CM | POA: Diagnosis not present

## 2024-01-01 MED ORDER — DEXAMETHASONE 6 MG PO TABS
10.0000 mg | ORAL_TABLET | Freq: Once | ORAL | Status: AC
  Administered 2024-01-01: 10 mg via ORAL
  Filled 2024-01-01 (×2): qty 1

## 2024-01-01 MED ORDER — OXYCODONE-ACETAMINOPHEN 5-325 MG PO TABS
1.0000 | ORAL_TABLET | Freq: Once | ORAL | Status: AC
  Administered 2024-01-01: 1 via ORAL
  Filled 2024-01-01: qty 1

## 2024-01-01 MED ORDER — KETOROLAC TROMETHAMINE 60 MG/2ML IM SOLN
30.0000 mg | Freq: Once | INTRAMUSCULAR | Status: AC
  Administered 2024-01-01: 30 mg via INTRAMUSCULAR
  Filled 2024-01-01: qty 2

## 2024-01-01 MED ORDER — LIDOCAINE 5 % EX PTCH
1.0000 | MEDICATED_PATCH | Freq: Once | CUTANEOUS | Status: DC
  Administered 2024-01-01: 1 via TRANSDERMAL
  Filled 2024-01-01: qty 1

## 2024-01-01 MED ORDER — LIDOCAINE 5 % EX PTCH: 1.0000 | MEDICATED_PATCH | CUTANEOUS | 0 refills | Status: DC

## 2024-01-01 MED ORDER — METHOCARBAMOL 500 MG PO TABS: 500.0000 mg | ORAL_TABLET | Freq: Three times a day (TID) | ORAL | 0 refills | Status: DC | PRN

## 2024-01-01 NOTE — ED Provider Notes (Signed)
[facility] Provider Note    Event Date/Time   First MD Initiated Contact with Patient 01/01/24 1155     (approximate)   History   Fall   HPI Angel Riddle is a 68 y.o. female with history of chronic lumbar stenosis with neurogenic claudication, lumbar radiculitis presenting today for low back pain.  Patient has chronic low back pain but stated falling 10 days ago on her left side.  She felt like it exacerbated her symptoms and has worsening of her right lower back pain with pain radiating down her leg.  She has been seen by her physical medicine and rehab doctor around the time of this injury as well with trigger point injections performed.  She notes continuing pain with radiation down her leg.  Denies any new numbness or tingling.  Still able to ambulate with no significant pain.  No pain to the midline of her back.  No trauma from the fall to her back.  Denies loss of bowel or bladder.  Reportedly stated that her neurosurgery team is already working on potentially getting her set up for procedure on her lower back.  Reviewed notes from 12/22/2023 visit with physical medicine and rehab.     Physical Exam   Triage Vital Signs: ED Triage Vitals  Encounter Vitals Group     BP 01/01/24 1024 107/84     Systolic BP Percentile --      Diastolic BP Percentile --      Pulse Rate 01/01/24 1024 100     Resp 01/01/24 1024 17     Temp 01/01/24 1024 97.9 F (36.6 C)     Temp Source 01/01/24 1024 Oral     SpO2 01/01/24 1024 99 %     Weight 01/01/24 1033 185 lb (83.9 kg)     Height 01/01/24 1033 5\' 5"  (1.651 m)     Head Circumference --      Peak Flow --      Pain Score 01/01/24 1033 9     Pain Loc --      Pain Education --      Exclude from Growth Chart --     Most recent vital signs: Vitals:   01/01/24 1024  BP: 107/84  Pulse: 100  Resp: 17  Temp: 97.9 F (36.6 C)  SpO2: 99%   I have reviewed the vital signs. General:  Awake, alert, no acute  distress. Head:  Normocephalic, Atraumatic. EENT:  PERRL, EOMI, Oral mucosa pink and moist, Neck is supple. Cardiovascular: Regular rate, 2+ distal pulses. Respiratory:  Normal respiratory effort, symmetrical expansion, no distress.   Extremities: No tenderness palpation over T or L-spine.  Tenderness to palpation slightly to the right paraspinal muscles.  Positive straight leg raise test on the right side.  No numbness or tingling deficits down bilateral lower extremities. Neuro:  Alert and oriented.  Interacting appropriately.   Skin:  Warm, dry, no rash.   Psych: Appropriate affect.    ED Results / Procedures / Treatments   Labs (all labs ordered are listed, but only abnormal results are displayed) Labs Reviewed - No data to display   EKG    RADIOLOGY    PROCEDURES:  Critical Care performed: No  Procedures   MEDICATIONS ORDERED IN ED: Medications  lidocaine (LIDODERM) 5 % 1 patch (1 patch Transdermal Patch Applied 01/01/24 1316)  ketorolac (TORADOL) injection 30 mg (30 mg Intramuscular Given 01/01/24 1311)  oxyCODONE-acetaminophen (PERCOCET/ROXICET) 5-325 MG per tablet 1  tablet (1 tablet Oral Given 01/01/24 1310)  dexamethasone (DECADRON) tablet 10 mg (10 mg Oral Given 01/01/24 1310)     IMPRESSION / MDM / ASSESSMENT AND PLAN / ED COURSE  I reviewed the triage vital signs and the nursing notes.                              Differential diagnosis includes, but is not limited to, acute on chronic low back pain, lumbar radiculitis  Patient's presentation is most consistent with acute, uncomplicated illness.  Patient is a 68 year old female presenting today for pain management of her acute on chronic right lower back pain.  Did have a fall 10 days ago but did not hit this particular area.  She has no tenderness palpation over T or L-spine to necessitate imaging at this time.  Has already been seen by her physical medicine and rehab doctor since this injury.  We will work  on trying to improve pain symptoms here today with Toradol, Percocet, Lidoderm patch, and Decadron.  Patient did have improvement in her symptoms and was able to ambulate.  Will send her with Lidoderm patch as well as Robaxin.  Patient stated she was having hallucinations on the Flexeril so advised her to stop taking this.  Told her to call her neurosurgery team tomorrow for ongoing outpatient management.  Given strict return precautions.  Clinical Course as of 01/01/24 1355  Sun Jan 01, 2024  1352 Symptomatically improved at this time.  Ready for discharge [DW]    Clinical Course User Index [DW] Janith Lima, MD     FINAL CLINICAL IMPRESSION(S) / ED DIAGNOSES   Final diagnoses:  Acute on chronic low back pain     Rx / DC Orders   ED Discharge Orders          Ordered    lidocaine (LIDODERM) 5 %  Every 24 hours        01/01/24 1353    methocarbamol (ROBAXIN) 500 MG tablet  Every 8 hours PRN        01/01/24 1353             Note:  This document was prepared using Dragon voice recognition software and may include unintentional dictation errors.   Janith Lima, MD 01/01/24 1355

## 2024-01-01 NOTE — Discharge Instructions (Addendum)
Please pick up these medicines from the pharmacy and take as prescribed.  You can stop taking the Flexeril and take the Robaxin for your muscle relaxer instead.  Please call your neurosurgery team tomorrow for ongoing follow-up.

## 2024-01-01 NOTE — ED Triage Notes (Signed)
Pt via POV from home. Pt c/o mechanical on 1/16. Pt c/o lower back pain, sacral pain, hip pain on the R side that radiates down the R leg. Pt was seen on [facility] on 1/17 and was received. Denies any imaging done. They prescribed her Flexeril and has home Hydrocodone with no relief. Pt has a hx of back surgery in 2018. Pt is A&OX4 and NAD, ambulatory to triage.

## 2024-01-02 ENCOUNTER — Other Ambulatory Visit: Payer: Self-pay | Admitting: Neurosurgery

## 2024-01-03 ENCOUNTER — Other Ambulatory Visit: Payer: Self-pay

## 2024-01-03 ENCOUNTER — Other Ambulatory Visit (HOSPITAL_COMMUNITY): Payer: Self-pay

## 2024-01-03 NOTE — Telephone Encounter (Signed)
PDMP reviewed. Rx ok'd for xanax and meloxicam (#30 with no refills).

## 2024-01-05 DIAGNOSIS — M5416 Radiculopathy, lumbar region: Secondary | ICD-10-CM | POA: Diagnosis not present

## 2024-01-09 ENCOUNTER — Other Ambulatory Visit (HOSPITAL_COMMUNITY): Payer: Self-pay

## 2024-01-11 ENCOUNTER — Other Ambulatory Visit (HOSPITAL_COMMUNITY): Payer: Self-pay

## 2024-01-11 ENCOUNTER — Telehealth: Payer: Self-pay

## 2024-01-11 NOTE — Telephone Encounter (Signed)
 Pharmacy Patient Advocate Encounter   Received notification from  [facility] Portal that prior authorization for Praluent  75MG /ML auto-injectors is required/requested.   Insurance verification completed.   The patient is insured through [facility] ADVANTAGE/RX ADVANCE .   Per test claim: PA required; PA submitted to above mentioned insurance via CoverMyMeds Key/confirmation #/EOC  [facility] Status is pending

## 2024-01-12 NOTE — Telephone Encounter (Signed)
 Patient is calling in regarding her medication she is needing her a pa done today she is out of medication

## 2024-01-13 NOTE — Telephone Encounter (Signed)
Appeal completed

## 2024-01-13 NOTE — Telephone Encounter (Signed)
 Pharmacy Patient Advocate Encounter  Received notification from [facility] ADVANTAGE/RX ADVANCE that Prior Authorization for Praluent  75MG /ML auto-injectors has been DENIED.  See denial reason below. No denial letter attached in CMM. Will attach denial letter to Media tab once received.   PA #/Case ID/Reference #: [id number]  *Insurance denied it saying she needs to try a statin first which I see she has. I have done an appeal on it. Appeals can take up to 30 days for a decision to be made.

## 2024-01-16 ENCOUNTER — Other Ambulatory Visit (HOSPITAL_COMMUNITY): Payer: Self-pay

## 2024-01-19 DIAGNOSIS — M48062 Spinal stenosis, lumbar region with neurogenic claudication: Secondary | ICD-10-CM | POA: Diagnosis not present

## 2024-01-19 DIAGNOSIS — M5416 Radiculopathy, lumbar region: Secondary | ICD-10-CM | POA: Diagnosis not present

## 2024-01-20 ENCOUNTER — Other Ambulatory Visit: Payer: Self-pay | Admitting: Neurosurgery

## 2024-01-23 ENCOUNTER — Other Ambulatory Visit (HOSPITAL_COMMUNITY): Payer: Self-pay

## 2024-01-25 ENCOUNTER — Other Ambulatory Visit (HOSPITAL_COMMUNITY): Payer: Self-pay

## 2024-01-29 ENCOUNTER — Other Ambulatory Visit: Payer: Self-pay | Admitting: Internal Medicine

## 2024-01-30 ENCOUNTER — Other Ambulatory Visit: Payer: Self-pay | Admitting: Internal Medicine

## 2024-01-31 NOTE — Telephone Encounter (Signed)
 I refilled her albuterol inhaler. Please call and confirm she is doing ok. Any acute respiratory symptoms?

## 2024-01-31 NOTE — Telephone Encounter (Signed)
 Received a request for meloxicam. She has been taking prn. She had met b drawn 2 months ago. See if agreeable to come in this week - to confirm kidney function ok. Confirm how often using meloxicam.

## 2024-02-01 ENCOUNTER — Other Ambulatory Visit: Payer: Medicare Other

## 2024-02-02 ENCOUNTER — Other Ambulatory Visit (HOSPITAL_COMMUNITY): Payer: PPO

## 2024-02-02 ENCOUNTER — Other Ambulatory Visit: Payer: TRICARE For Life (TFL)

## 2024-02-02 NOTE — Pre-Procedure Instructions (Signed)
 Surgical Instructions   Your procedure is scheduled on February 08, 2024. Report to [facility] Main Entrance "A" at 6:30 A.M., then check in with the Admitting office. Any questions or running late day of surgery: call [phone]  Questions prior to your surgery date: call [phone], Monday-Friday, 8am-4pm. If you experience any cold or flu symptoms such as cough, fever, chills, shortness of breath, etc. between now and your scheduled surgery, please notify us at the above number.     Remember:  Do not eat or drink after midnight the night before your surgery    Take these medicines the morning of surgery with A SIP OF WATER: FLUoxetine (PROZAC)  metoprolol succinate (TOPROL-XL)  pantoprazole (PROTONIX)  pregabalin (LYRICA)    May take these medicines IF NEEDED: albuterol (VENTOLIN HFA) inhaler - please bring inhaler with you morning of surgery ALPRAZolam (XANAX)  cyclobenzaprine (FLEXERIL)  HYDROcodone-acetaminophen (NORCO)  methocarbamol (ROBAXIN)    STOP taking your aspirin and meloxicam (MOBIC) five days prior to surgery. Your last doses will be February 28th.   One week prior to surgery, STOP taking any Aleve, Naproxen, Ibuprofen, Motrin, Advil, Goody's, BC's, all herbal medications, fish oil, and non-prescription vitamins.                     Do NOT Smoke (Tobacco/Vaping) for 24 hours prior to your procedure.  If you use a CPAP at night, you may bring your mask/headgear for your overnight stay.   You will be asked to remove any contacts, glasses, piercing's, hearing aid's, dentures/partials prior to surgery. Please bring cases for these items if needed.    Patients discharged the day of surgery will not be allowed to drive home, and someone needs to stay with them for 24 hours.  SURGICAL WAITING ROOM VISITATION Patients may have no more than 2 support people in the waiting area - these visitors may rotate.   Pre-op nurse will coordinate an appropriate time for 1  ADULT support person, who may not rotate, to accompany patient in pre-op.  Children under the age of 101 must have an adult with them who is not the patient and must remain in the main waiting area with an adult.  If the patient needs to stay at the hospital during part of their recovery, the visitor guidelines for inpatient rooms apply.  Please refer to the [employer] website for the visitor guidelines for any additional information.   If you received a COVID test during your pre-op visit  it is requested that you wear a mask when out in public, stay away from anyone that may not be feeling well and notify your surgeon if you develop symptoms. If you have been in contact with anyone that has tested positive in the last 10 days please notify you surgeon.      Pre-operative 5 CHG Bathing Instructions   You can play a key role in reducing the risk of infection after surgery. Your skin needs to be as free of germs as possible. You can reduce the number of germs on your skin by washing with CHG (chlorhexidine gluconate) soap before surgery. CHG is an antiseptic soap that kills germs and continues to kill germs even after washing.   DO NOT use if you have an allergy to chlorhexidine/CHG or antibacterial soaps. If your skin becomes reddened or irritated, stop using the CHG and notify one of our RNs at [phone].   Please shower with the CHG soap starting 4 days  before surgery using the following schedule:     Please keep in mind the following:  DO NOT shave, including legs and underarms, starting the day of your first shower.   You may shave your face at any point before/day of surgery.  Place clean sheets on your bed the day you start using CHG soap. Use a clean washcloth (not used since being washed) for each shower. DO NOT sleep with pets once you start using the CHG.   CHG Shower Instructions:  Wash your face and private area with normal soap. If you choose to wash your hair, wash  first with your normal shampoo.  After you use shampoo/soap, rinse your hair and body thoroughly to remove shampoo/soap residue.  Turn the water OFF and apply about 3 tablespoons (45 ml) of CHG soap to a CLEAN washcloth.  Apply CHG soap ONLY FROM YOUR NECK DOWN TO YOUR TOES (washing for 3-5 minutes)  DO NOT use CHG soap on face, private areas, open wounds, or sores.  Pay special attention to the area where your surgery is being performed.  If you are having back surgery, having someone wash your back for you may be helpful. Wait 2 minutes after CHG soap is applied, then you may rinse off the CHG soap.  Pat dry with a clean towel  Put on clean clothes/pajamas   If you choose to wear lotion, please use ONLY the CHG-compatible lotions that are listed below.  Additional instructions for the day of surgery: DO NOT APPLY any lotions, deodorants, cologne, or perfumes.   Do not bring valuables to the hospital. [facility] is not responsible for any belongings/valuables. Do not wear nail polish, gel polish, artificial nails, or any other type of covering on natural nails (fingers and toes) Do not wear jewelry or makeup Put on clean/comfortable clothes.  Please brush your teeth.  Ask your nurse before applying any prescription medications to the skin.     CHG Compatible Lotions   Aveeno Moisturizing lotion  Cetaphil Moisturizing Cream  Cetaphil Moisturizing Lotion  Clairol Herbal Essence Moisturizing Lotion, Dry Skin  Clairol Herbal Essence Moisturizing Lotion, Extra Dry Skin  Clairol Herbal Essence Moisturizing Lotion, Normal Skin  Curel Age Defying Therapeutic Moisturizing Lotion with Alpha Hydroxy  Curel Extreme Care Body Lotion  Curel Soothing Hands Moisturizing Hand Lotion  Curel Therapeutic Moisturizing Cream, Fragrance-Free  Curel Therapeutic Moisturizing Lotion, Fragrance-Free  Curel Therapeutic Moisturizing Lotion, Original Formula  Eucerin Daily Replenishing Lotion  Eucerin Dry  Skin Therapy Plus Alpha Hydroxy Crme  Eucerin Dry Skin Therapy Plus Alpha Hydroxy Lotion  Eucerin Original Crme  Eucerin Original Lotion  Eucerin Plus Crme Eucerin Plus Lotion  Eucerin TriLipid Replenishing Lotion  Keri Anti-Bacterial Hand Lotion  Keri Deep Conditioning Original Lotion Dry Skin Formula Softly Scented  Keri Deep Conditioning Original Lotion, Fragrance Free Sensitive Skin Formula  Keri Lotion Fast Absorbing Fragrance Free Sensitive Skin Formula  Keri Lotion Fast Absorbing Softly Scented Dry Skin Formula  Keri Original Lotion  Keri Skin Renewal Lotion Keri Silky Smooth Lotion  Keri Silky Smooth Sensitive Skin Lotion  Nivea Body Creamy Conditioning Oil  Nivea Body Extra Enriched Lotion  Nivea Body Original Lotion  Nivea Body Sheer Moisturizing Lotion Nivea Crme  Nivea Skin Firming Lotion  NutraDerm 30 Skin Lotion  NutraDerm Skin Lotion  NutraDerm Therapeutic Skin Cream  NutraDerm Therapeutic Skin Lotion  ProShield Protective Hand Cream  Provon moisturizing lotion  Please read over the following fact sheets that you were given.

## 2024-02-03 ENCOUNTER — Encounter (HOSPITAL_COMMUNITY)
Admission: RE | Admit: 2024-02-03 | Discharge: 2024-02-03 | Disposition: A | Discharge status: Home / Self Care | Payer: PPO | Source: Ambulatory Visit | Attending: Neurosurgery | Admitting: Neurosurgery

## 2024-02-03 ENCOUNTER — Encounter: Payer: Medicare Other | Admitting: Internal Medicine

## 2024-02-03 ENCOUNTER — Encounter (HOSPITAL_COMMUNITY): Payer: Self-pay

## 2024-02-03 ENCOUNTER — Other Ambulatory Visit: Payer: Self-pay

## 2024-02-03 VITALS — BP 112/65 | HR 79 | Temp 98.2°F | Resp 18 | Ht 65.0 in | Wt 191.8 lb

## 2024-02-03 DIAGNOSIS — Z01812 Encounter for preprocedural laboratory examination: Secondary | ICD-10-CM | POA: Insufficient documentation

## 2024-02-03 DIAGNOSIS — I2510 Atherosclerotic heart disease of native coronary artery without angina pectoris: Secondary | ICD-10-CM | POA: Insufficient documentation

## 2024-02-03 DIAGNOSIS — Z01818 Encounter for other preprocedural examination: Secondary | ICD-10-CM

## 2024-02-03 HISTORY — DX: Headache, unspecified: R51.9

## 2024-02-03 HISTORY — DX: Cardiac arrhythmia, unspecified: I49.9

## 2024-02-03 LAB — TYPE AND SCREEN
ABO/RH(D): O NEG
Antibody Screen: NEGATIVE

## 2024-02-03 LAB — CBC
HCT: 40.9 % (ref 36.0–46.0)
Hemoglobin: 13.9 g/dL (ref 12.0–15.0)
MCH: 30 pg (ref 26.0–34.0)
MCHC: 34 g/dL (ref 30.0–36.0)
MCV: 88.3 fL (ref 80.0–100.0)
Platelets: 323 10*3/uL (ref 150–400)
RBC: 4.63 MIL/uL (ref 3.87–5.11)
RDW: 13.1 % (ref 11.5–15.5)
WBC: 12 10*3/uL — ABNORMAL HIGH (ref 4.0–10.5)
nRBC: 0 % (ref 0.0–0.2)

## 2024-02-03 LAB — BASIC METABOLIC PANEL
Anion gap: 13 (ref 5–15)
BUN: 14 mg/dL (ref 8–23)
CO2: 26 mmol/L (ref 22–32)
Calcium: 9.7 mg/dL (ref 8.9–10.3)
Chloride: 99 mmol/L (ref 98–111)
Creatinine, Ser: 0.93 mg/dL (ref 0.44–1.00)
GFR, Estimated: >60 mL/min (ref >60)
Glucose, Bld: 94 mg/dL (ref 70–99)
Potassium: 4.3 mmol/L (ref 3.5–5.1)
Sodium: 138 mmol/L (ref 135–145)

## 2024-02-03 LAB — SURGICAL PCR SCREEN
MRSA, PCR: NEGATIVE
Staphylococcus aureus: POSITIVE — AB

## 2024-02-03 NOTE — Progress Notes (Signed)
 PCP - Dr. Dale Lincroft Cardiologist - Dr. Julien Nordmann - Last office visit 07/18/2023  PPM/ICD - Denies Device Orders - n/a Rep Notified -  n/a  Chest x-ray - 12/08/2023 EKG - 07/18/2023 Stress Test - Per pt, around October 2004 - result normal ECHO - 05/13/2022 Cardiac Cath - Per pt, around October 2004 - no intervention  Sleep Study - Denies CPAP - n/a  No DM  Last dose of GLP1 agonist- n/a GLP1 instructions: n/a  Blood Thinner Instructions: n/a Aspirin Instructions: Surgeon instructed holding ASA for 5 days prior to surgery. Pts last dose was February 24th  NPO after midnight  COVID TEST- n/a   Anesthesia review: Yes. Cardiac hx with yearly cardiology follow-up (HTN, Sinus tachycardia)   Patient denies shortness of breath, fever, cough and chest pain at PAT appointment. Pt endorses a URI diagnosed on January 2nd. She completed a week long course of antibiotic and prednisone. She has had symptom resolution for at least 3 weeks.   All instructions explained to the patient, with a verbal understanding of the material. Patient agrees to go over the instructions while at home for a better understanding. Patient also instructed to self quarantine after being tested for COVID-19. The opportunity to ask questions was provided.

## 2024-02-05 ENCOUNTER — Encounter: Payer: Self-pay | Admitting: Internal Medicine

## 2024-02-07 ENCOUNTER — Other Ambulatory Visit: Payer: Self-pay

## 2024-02-07 ENCOUNTER — Other Ambulatory Visit (INDEPENDENT_AMBULATORY_CARE_PROVIDER_SITE_OTHER)

## 2024-02-07 DIAGNOSIS — I10 Essential (primary) hypertension: Secondary | ICD-10-CM

## 2024-02-07 LAB — BASIC METABOLIC PANEL
BUN: 18 mg/dL (ref 6–23)
CO2: 27 meq/L (ref 19–32)
Calcium: 9.4 mg/dL (ref 8.4–10.5)
Chloride: 100 meq/L (ref 96–112)
Creatinine, Ser: 1.19 mg/dL (ref 0.40–1.20)
GFR: 47.15 mL/min — ABNORMAL LOW (ref >60.00)
Glucose, Bld: 114 mg/dL — ABNORMAL HIGH (ref 70–99)
Potassium: 3.9 meq/L (ref 3.5–5.1)
Sodium: 136 meq/L (ref 135–145)

## 2024-02-07 NOTE — Telephone Encounter (Signed)
 If she is having surgery, she needs to be off antiinflammatory medication. Pain medications are usually managed by surgeon after a patient has a surgery. Once released from surgery, let us know for refill of medication. If she has been taking antiinflammatory medication, she needs to let surgeon know before surgery.

## 2024-02-07 NOTE — Anesthesia Preprocedure Evaluation (Signed)
 Anesthesia Evaluation  Patient identified by MRN, date of birth, ID band Patient awake    Reviewed: Allergy & Precautions, NPO status , Patient's Chart, lab work & pertinent test results  History of Anesthesia Complications Negative for: history of anesthetic complications  Airway Mallampati: III  TM Distance: >3 FB Neck ROM: Full  Mouth opening: Limited Mouth Opening  Dental  (+) Dental Advisory Given   Pulmonary COPD,  COPD inhaler, Current Smoker and Patient abstained from smoking.   breath sounds clear to auscultation       Cardiovascular hypertension, Pt. on medications (-) angina  Rhythm:Regular Rate:Normal  '23 ECHO: EF 60 to 65%.  1. The LV has normal function, no regional wall motion abnormalities. There is mild LVH. Grade I diastolic dysfunction (impaired relaxation).  The average left ventricular global longitudinal strain is -16.2 %.   2. RVF is normal. The right ventricular size is normal. There is normal pulmonary artery systolic pressure. The estimated right ventricular systolic pressure is 30.0 mmHg.   3. The MV is normal in structure. Trivial mitral valve regurgitation. No evidence of mitral stenosis.   4. The AV is normal in structure. Aortic valve regurgitation is not visualized. No aortic stenosis is present.     Neuro/Psych  Headaches  Anxiety Depression    Chronic back pain    GI/Hepatic Neg liver ROS,GERD  Medicated and Controlled,,  Endo/Other  BMI 32  Renal/GU negative Renal ROS     Musculoskeletal   Abdominal   Peds  Hematology negative hematology ROS (+) Hb 13.9, plt 323k   Anesthesia Other Findings   Reproductive/Obstetrics                             Anesthesia Physical Anesthesia Plan  ASA: 3  Anesthesia Plan: General   Post-op Pain Management: Tylenol PO (pre-op)*   Induction: Intravenous  PONV Risk Score and Plan: 2 and Ondansetron and  Dexamethasone  Airway Management Planned: Oral ETT and Video Laryngoscope Planned  Additional Equipment: None  Intra-op Plan:   Post-operative Plan: Extubation in OR  Informed Consent: I have reviewed the patients History and Physical, chart, labs and discussed the procedure including the risks, benefits and alternatives for the proposed anesthesia with the patient or authorized representative who has indicated his/her understanding and acceptance.     Dental advisory given  Plan Discussed with: CRNA and Surgeon  Anesthesia Plan Comments:         Anesthesia Quick Evaluation

## 2024-02-07 NOTE — Telephone Encounter (Signed)
 Disregard the my chart message sent on her. I have talked to her and she has been taking meloxicam everyday, not prn. She is coming in today to have a BMP drawn.

## 2024-02-07 NOTE — Telephone Encounter (Signed)
 Talked with patient while in for labs. She stopped meloxicam for surgery. She will follow up after surgery. Explained surgeon manages pain meds after surgery. Pt gave verbal understanding.

## 2024-02-07 NOTE — Telephone Encounter (Signed)
 No acute resp symptoms.

## 2024-02-08 ENCOUNTER — Ambulatory Visit (HOSPITAL_COMMUNITY): Payer: Self-pay | Admitting: Physician Assistant

## 2024-02-08 ENCOUNTER — Ambulatory Visit (HOSPITAL_COMMUNITY)
Admission: RE | Admit: 2024-02-08 | Discharge: 2024-02-09 | Disposition: A | Discharge status: Home / Self Care | Payer: PPO | Source: Ambulatory Visit | Attending: Neurosurgery | Admitting: Neurosurgery

## 2024-02-08 ENCOUNTER — Ambulatory Visit (HOSPITAL_COMMUNITY)

## 2024-02-08 ENCOUNTER — Ambulatory Visit (HOSPITAL_BASED_OUTPATIENT_CLINIC_OR_DEPARTMENT_OTHER): Payer: Self-pay | Admitting: Anesthesiology

## 2024-02-08 ENCOUNTER — Other Ambulatory Visit: Payer: Self-pay

## 2024-02-08 ENCOUNTER — Encounter (HOSPITAL_COMMUNITY)
Admission: RE | Disposition: A | Discharge status: Home / Self Care | Payer: Self-pay | Source: Ambulatory Visit | Attending: Neurosurgery

## 2024-02-08 ENCOUNTER — Encounter (HOSPITAL_COMMUNITY): Payer: Self-pay | Admitting: Neurosurgery

## 2024-02-08 DIAGNOSIS — K219 Gastro-esophageal reflux disease without esophagitis: Secondary | ICD-10-CM | POA: Diagnosis not present

## 2024-02-08 DIAGNOSIS — Z0189 Encounter for other specified special examinations: Secondary | ICD-10-CM | POA: Diagnosis not present

## 2024-02-08 DIAGNOSIS — Z981 Arthrodesis status: Secondary | ICD-10-CM | POA: Diagnosis not present

## 2024-02-08 DIAGNOSIS — F419 Anxiety disorder, unspecified: Secondary | ICD-10-CM | POA: Insufficient documentation

## 2024-02-08 DIAGNOSIS — Z79899 Other long term (current) drug therapy: Secondary | ICD-10-CM | POA: Diagnosis not present

## 2024-02-08 DIAGNOSIS — M5116 Intervertebral disc disorders with radiculopathy, lumbar region: Secondary | ICD-10-CM | POA: Diagnosis not present

## 2024-02-08 DIAGNOSIS — G8929 Other chronic pain: Secondary | ICD-10-CM | POA: Diagnosis not present

## 2024-02-08 DIAGNOSIS — Z7951 Long term (current) use of inhaled steroids: Secondary | ICD-10-CM | POA: Insufficient documentation

## 2024-02-08 DIAGNOSIS — J449 Chronic obstructive pulmonary disease, unspecified: Secondary | ICD-10-CM | POA: Insufficient documentation

## 2024-02-08 DIAGNOSIS — M48062 Spinal stenosis, lumbar region with neurogenic claudication: Secondary | ICD-10-CM | POA: Diagnosis not present

## 2024-02-08 DIAGNOSIS — I10 Essential (primary) hypertension: Secondary | ICD-10-CM | POA: Diagnosis not present

## 2024-02-08 DIAGNOSIS — F17210 Nicotine dependence, cigarettes, uncomplicated: Secondary | ICD-10-CM | POA: Insufficient documentation

## 2024-02-08 DIAGNOSIS — M4316 Spondylolisthesis, lumbar region: Secondary | ICD-10-CM | POA: Diagnosis not present

## 2024-02-08 DIAGNOSIS — F32A Depression, unspecified: Secondary | ICD-10-CM | POA: Diagnosis not present

## 2024-02-08 SURGERY — POSTERIOR LUMBAR FUSION 1 LEVEL
Anesthesia: General | Site: Spine Lumbar

## 2024-02-08 MED ORDER — ACETAMINOPHEN 325 MG PO TABS: 650.0000 mg | ORAL_TABLET | ORAL | Status: DC | PRN

## 2024-02-08 MED ORDER — CYCLOBENZAPRINE HCL 10 MG PO TABS
10.0000 mg | ORAL_TABLET | Freq: Three times a day (TID) | ORAL | Status: DC | PRN
  Administered 2024-02-08 – 2024-02-09 (×2): 10 mg via ORAL
  Filled 2024-02-08 (×2): qty 1

## 2024-02-08 MED ORDER — BUPIVACAINE-EPINEPHRINE (PF) 0.5% -1:200000 IJ SOLN
INTRAMUSCULAR | Status: DC | PRN
  Administered 2024-02-08: 10 mL via PERINEURAL

## 2024-02-08 MED ORDER — PHENYLEPHRINE 80 MCG/ML (10ML) SYRINGE FOR IV PUSH (FOR BLOOD PRESSURE SUPPORT)
PREFILLED_SYRINGE | INTRAVENOUS | Status: AC
  Filled 2024-02-08: qty 10

## 2024-02-08 MED ORDER — THROMBIN 5000 UNITS EX KIT
PACK | CUTANEOUS | Status: AC
  Filled 2024-02-08: qty 1

## 2024-02-08 MED ORDER — ALBUMIN HUMAN 5 % IV SOLN: INTRAVENOUS | Status: DC | PRN

## 2024-02-08 MED ORDER — ACETAMINOPHEN 500 MG PO TABS
1000.0000 mg | ORAL_TABLET | Freq: Four times a day (QID) | ORAL | Status: DC
  Administered 2024-02-08 – 2024-02-09 (×3): 1000 mg via ORAL
  Filled 2024-02-08 (×3): qty 2

## 2024-02-08 MED ORDER — ROCURONIUM BROMIDE 10 MG/ML (PF) SYRINGE
PREFILLED_SYRINGE | INTRAVENOUS | Status: DC | PRN
  Administered 2024-02-08: 10 mg via INTRAVENOUS
  Administered 2024-02-08: 20 mg via INTRAVENOUS
  Administered 2024-02-08: 60 mg via INTRAVENOUS

## 2024-02-08 MED ORDER — PROPOFOL 10 MG/ML IV BOLUS
INTRAVENOUS | Status: AC
  Filled 2024-02-08: qty 20

## 2024-02-08 MED ORDER — SUGAMMADEX SODIUM 200 MG/2ML IV SOLN
INTRAVENOUS | Status: DC | PRN
  Administered 2024-02-08: 150 mg via INTRAVENOUS

## 2024-02-08 MED ORDER — LIDOCAINE 2% (20 MG/ML) 5 ML SYRINGE
INTRAMUSCULAR | Status: DC | PRN
  Administered 2024-02-08: 40 mg via INTRAVENOUS

## 2024-02-08 MED ORDER — HYDROMORPHONE HCL 1 MG/ML IJ SOLN
INTRAMUSCULAR | Status: DC | PRN
  Administered 2024-02-08: .5 mg via INTRAVENOUS

## 2024-02-08 MED ORDER — POTASSIUM CHLORIDE CRYS ER 10 MEQ PO TBCR
10.0000 meq | EXTENDED_RELEASE_TABLET | Freq: Every day | ORAL | Status: DC
  Filled 2024-02-08 (×2): qty 1

## 2024-02-08 MED ORDER — DEXAMETHASONE SODIUM PHOSPHATE 10 MG/ML IJ SOLN
INTRAMUSCULAR | Status: AC
  Filled 2024-02-08: qty 1

## 2024-02-08 MED ORDER — CEFAZOLIN SODIUM-DEXTROSE 2-4 GM/100ML-% IV SOLN
2.0000 g | Freq: Three times a day (TID) | INTRAVENOUS | Status: AC
  Administered 2024-02-08 – 2024-02-09 (×2): 2 g via INTRAVENOUS
  Filled 2024-02-08 (×2): qty 100

## 2024-02-08 MED ORDER — HYDROMORPHONE HCL 1 MG/ML IJ SOLN
0.2500 mg | INTRAMUSCULAR | Status: DC | PRN
  Administered 2024-02-08 (×2): 0.5 mg via INTRAVENOUS

## 2024-02-08 MED ORDER — BACITRACIN ZINC 500 UNIT/GM EX OINT
TOPICAL_OINTMENT | CUTANEOUS | Status: AC
  Filled 2024-02-08: qty 28.35

## 2024-02-08 MED ORDER — CHLORHEXIDINE GLUCONATE CLOTH 2 % EX PADS: 6.0000 | MEDICATED_PAD | Freq: Once | CUTANEOUS | Status: DC

## 2024-02-08 MED ORDER — OXYCODONE HCL 5 MG PO TABS
10.0000 mg | ORAL_TABLET | ORAL | Status: DC | PRN
  Administered 2024-02-08 – 2024-02-09 (×4): 10 mg via ORAL
  Filled 2024-02-08 (×5): qty 2

## 2024-02-08 MED ORDER — ZOLPIDEM TARTRATE 5 MG PO TABS
5.0000 mg | ORAL_TABLET | Freq: Every evening | ORAL | Status: DC | PRN
  Administered 2024-02-08: 5 mg via ORAL
  Filled 2024-02-08: qty 1

## 2024-02-08 MED ORDER — LIDOCAINE 2% (20 MG/ML) 5 ML SYRINGE
INTRAMUSCULAR | Status: AC
  Filled 2024-02-08: qty 5

## 2024-02-08 MED ORDER — MIDAZOLAM HCL 2 MG/2ML IJ SOLN
INTRAMUSCULAR | Status: AC
  Filled 2024-02-08: qty 2

## 2024-02-08 MED ORDER — THROMBIN 5000 UNITS EX SOLR
OROMUCOSAL | Status: DC | PRN
  Administered 2024-02-08 (×2): 5 mL via TOPICAL

## 2024-02-08 MED ORDER — HYDROMORPHONE HCL 1 MG/ML IJ SOLN
INTRAMUSCULAR | Status: AC
  Filled 2024-02-08: qty 1

## 2024-02-08 MED ORDER — HYDROMORPHONE HCL 1 MG/ML IJ SOLN
INTRAMUSCULAR | Status: AC
  Filled 2024-02-08: qty 0.5

## 2024-02-08 MED ORDER — PHENYLEPHRINE 80 MCG/ML (10ML) SYRINGE FOR IV PUSH (FOR BLOOD PRESSURE SUPPORT)
PREFILLED_SYRINGE | INTRAVENOUS | Status: DC | PRN
  Administered 2024-02-08: 80 ug via INTRAVENOUS
  Administered 2024-02-08 (×2): 160 ug via INTRAVENOUS
  Administered 2024-02-08: 80 ug via INTRAVENOUS
  Administered 2024-02-08: 240 ug via INTRAVENOUS
  Administered 2024-02-08: 80 ug via INTRAVENOUS
  Administered 2024-02-08: 160 ug via INTRAVENOUS

## 2024-02-08 MED ORDER — ALBUTEROL SULFATE (2.5 MG/3ML) 0.083% IN NEBU: 3.0000 mL | INHALATION_SOLUTION | Freq: Four times a day (QID) | RESPIRATORY_TRACT | Status: DC | PRN

## 2024-02-08 MED ORDER — MIDAZOLAM HCL 2 MG/2ML IJ SOLN: 0.5000 mg | Freq: Once | INTRAMUSCULAR | Status: DC | PRN

## 2024-02-08 MED ORDER — ACETAMINOPHEN 500 MG PO TABS
1000.0000 mg | ORAL_TABLET | Freq: Once | ORAL | Status: AC
  Administered 2024-02-08: 1000 mg via ORAL
  Filled 2024-02-08: qty 2

## 2024-02-08 MED ORDER — ORAL CARE MOUTH RINSE: 15.0000 mL | Freq: Once | OROMUCOSAL | Status: AC

## 2024-02-08 MED ORDER — BISACODYL 10 MG RE SUPP: 10.0000 mg | Freq: Every day | RECTAL | Status: DC | PRN

## 2024-02-08 MED ORDER — ACETAMINOPHEN 650 MG RE SUPP: 650.0000 mg | RECTAL | Status: DC | PRN

## 2024-02-08 MED ORDER — ONDANSETRON HCL 4 MG/2ML IJ SOLN
INTRAMUSCULAR | Status: AC
  Filled 2024-02-08: qty 2

## 2024-02-08 MED ORDER — DEXAMETHASONE SODIUM PHOSPHATE 10 MG/ML IJ SOLN
INTRAMUSCULAR | Status: DC | PRN
Start: 2024-02-08 — End: 2024-02-08
  Administered 2024-02-08: 10 mg via INTRAVENOUS

## 2024-02-08 MED ORDER — BACITRACIN ZINC 500 UNIT/GM EX OINT
TOPICAL_OINTMENT | CUTANEOUS | Status: DC | PRN
  Administered 2024-02-08: 1 via TOPICAL

## 2024-02-08 MED ORDER — FLUOXETINE HCL 20 MG PO CAPS
40.0000 mg | ORAL_CAPSULE | Freq: Every day | ORAL | Status: DC
  Filled 2024-02-08: qty 2

## 2024-02-08 MED ORDER — BUPIVACAINE-EPINEPHRINE (PF) 0.5% -1:200000 IJ SOLN
INTRAMUSCULAR | Status: AC
  Filled 2024-02-08: qty 30

## 2024-02-08 MED ORDER — PANTOPRAZOLE SODIUM 40 MG PO TBEC
40.0000 mg | DELAYED_RELEASE_TABLET | Freq: Every day | ORAL | Status: DC
  Filled 2024-02-08: qty 1

## 2024-02-08 MED ORDER — BUPIVACAINE LIPOSOME 1.3 % IJ SUSP
INTRAMUSCULAR | Status: DC | PRN
  Administered 2024-02-08: 20 mL

## 2024-02-08 MED ORDER — FENTANYL CITRATE (PF) 250 MCG/5ML IJ SOLN
INTRAMUSCULAR | Status: AC
  Filled 2024-02-08: qty 5

## 2024-02-08 MED ORDER — ONDANSETRON HCL 4 MG PO TABS: 4.0000 mg | ORAL_TABLET | Freq: Four times a day (QID) | ORAL | Status: DC | PRN

## 2024-02-08 MED ORDER — SUGAMMADEX SODIUM 200 MG/2ML IV SOLN
INTRAVENOUS | Status: AC
Start: 2024-02-08 — End: ?
  Filled 2024-02-08: qty 2

## 2024-02-08 MED ORDER — SODIUM CHLORIDE 0.9% FLUSH: 3.0000 mL | INTRAVENOUS | Status: DC | PRN

## 2024-02-08 MED ORDER — CHLORHEXIDINE GLUCONATE 0.12 % MT SOLN
15.0000 mL | Freq: Once | OROMUCOSAL | Status: AC
  Administered 2024-02-08: 15 mL via OROMUCOSAL
  Filled 2024-02-08: qty 15

## 2024-02-08 MED ORDER — PREGABALIN 75 MG PO CAPS
150.0000 mg | ORAL_CAPSULE | Freq: Every day | ORAL | Status: DC
  Filled 2024-02-08: qty 2

## 2024-02-08 MED ORDER — SODIUM CHLORIDE 0.9% FLUSH
3.0000 mL | Freq: Two times a day (BID) | INTRAVENOUS | Status: DC
  Administered 2024-02-08: 3 mL via INTRAVENOUS

## 2024-02-08 MED ORDER — MORPHINE SULFATE (PF) 2 MG/ML IV SOLN: 2.0000 mg | INTRAVENOUS | Status: DC | PRN

## 2024-02-08 MED ORDER — LACTATED RINGERS IV SOLN: INTRAVENOUS | Status: DC

## 2024-02-08 MED ORDER — PROPOFOL 10 MG/ML IV BOLUS
INTRAVENOUS | Status: DC | PRN
  Administered 2024-02-08: 100 mg via INTRAVENOUS

## 2024-02-08 MED ORDER — PHENYLEPHRINE HCL-NACL 20-0.9 MG/250ML-% IV SOLN
INTRAVENOUS | Status: DC | PRN
  Administered 2024-02-08: 50 ug/min via INTRAVENOUS

## 2024-02-08 MED ORDER — METOPROLOL SUCCINATE ER 25 MG PO TB24
25.0000 mg | ORAL_TABLET | Freq: Every day | ORAL | Status: DC
  Filled 2024-02-08: qty 1

## 2024-02-08 MED ORDER — OXYCODONE HCL 5 MG PO TABS
5.0000 mg | ORAL_TABLET | ORAL | Status: DC | PRN
  Administered 2024-02-09: 5 mg via ORAL
  Filled 2024-02-08: qty 1

## 2024-02-08 MED ORDER — ROCURONIUM BROMIDE 10 MG/ML (PF) SYRINGE
PREFILLED_SYRINGE | INTRAVENOUS | Status: AC
  Filled 2024-02-08: qty 10

## 2024-02-08 MED ORDER — TRIAMTERENE-HCTZ 37.5-25 MG PO TABS
0.5000 | ORAL_TABLET | Freq: Every day | ORAL | Status: DC
  Filled 2024-02-08: qty 0.5

## 2024-02-08 MED ORDER — 0.9 % SODIUM CHLORIDE (POUR BTL) OPTIME
TOPICAL | Status: DC | PRN
  Administered 2024-02-08: 1000 mL

## 2024-02-08 MED ORDER — CEFAZOLIN SODIUM-DEXTROSE 2-4 GM/100ML-% IV SOLN
2.0000 g | INTRAVENOUS | Status: AC
  Administered 2024-02-08: 2 g via INTRAVENOUS
  Filled 2024-02-08: qty 100

## 2024-02-08 MED ORDER — SODIUM CHLORIDE 0.9 % IV SOLN
250.0000 mL | INTRAVENOUS | Status: DC
  Administered 2024-02-08: 250 mL via INTRAVENOUS

## 2024-02-08 MED ORDER — SENNA 8.6 MG PO TABS
1.0000 | ORAL_TABLET | Freq: Two times a day (BID) | ORAL | Status: DC | PRN
  Administered 2024-02-08: 8.6 mg via ORAL
  Filled 2024-02-08: qty 1

## 2024-02-08 MED ORDER — PHENOL 1.4 % MT LIQD: 1.0000 | OROMUCOSAL | Status: DC | PRN

## 2024-02-08 MED ORDER — VASHE WOUND IRRIGATION OPTIME
TOPICAL | Status: DC | PRN
  Administered 2024-02-08: 34 [oz_av] via TOPICAL

## 2024-02-08 MED ORDER — MENTHOL 3 MG MT LOZG: 1.0000 | LOZENGE | OROMUCOSAL | Status: DC | PRN

## 2024-02-08 MED ORDER — EPHEDRINE 5 MG/ML INJ
INTRAVENOUS | Status: AC
Start: 2024-02-08 — End: ?
  Filled 2024-02-08: qty 5

## 2024-02-08 MED ORDER — FENTANYL CITRATE (PF) 250 MCG/5ML IJ SOLN
INTRAMUSCULAR | Status: DC | PRN
  Administered 2024-02-08 (×2): 100 ug via INTRAVENOUS
  Administered 2024-02-08: 50 ug via INTRAVENOUS

## 2024-02-08 MED ORDER — OXYCODONE HCL 5 MG PO TABS: 5.0000 mg | ORAL_TABLET | Freq: Once | ORAL | Status: DC | PRN

## 2024-02-08 MED ORDER — MIDAZOLAM HCL 2 MG/2ML IJ SOLN
INTRAMUSCULAR | Status: DC | PRN
  Administered 2024-02-08: 2 mg via INTRAVENOUS

## 2024-02-08 MED ORDER — ONDANSETRON HCL 4 MG/2ML IJ SOLN
INTRAMUSCULAR | Status: DC | PRN
  Administered 2024-02-08: 4 mg via INTRAVENOUS

## 2024-02-08 MED ORDER — OXYCODONE HCL 5 MG/5ML PO SOLN: 5.0000 mg | Freq: Once | ORAL | Status: DC | PRN

## 2024-02-08 MED ORDER — DOCUSATE SODIUM 100 MG PO CAPS
100.0000 mg | ORAL_CAPSULE | Freq: Two times a day (BID) | ORAL | Status: DC
  Administered 2024-02-08 – 2024-02-09 (×2): 100 mg via ORAL
  Filled 2024-02-08 (×2): qty 1

## 2024-02-08 MED ORDER — ALBUTEROL SULFATE (2.5 MG/3ML) 0.083% IN NEBU
INHALATION_SOLUTION | RESPIRATORY_TRACT | Status: AC
  Filled 2024-02-08: qty 3

## 2024-02-08 MED ORDER — ALBUTEROL SULFATE (2.5 MG/3ML) 0.083% IN NEBU
2.5000 mg | INHALATION_SOLUTION | Freq: Once | RESPIRATORY_TRACT | Status: AC
  Administered 2024-02-08: 2.5 mg via RESPIRATORY_TRACT

## 2024-02-08 MED ORDER — BUPIVACAINE LIPOSOME 1.3 % IJ SUSP
INTRAMUSCULAR | Status: AC
  Filled 2024-02-08: qty 20

## 2024-02-08 MED ORDER — ONDANSETRON HCL 4 MG/2ML IJ SOLN: 4.0000 mg | Freq: Four times a day (QID) | INTRAMUSCULAR | Status: DC | PRN

## 2024-02-08 SURGICAL SUPPLY — 61 items
BAG COUNTER SPONGE SURGICOUNT (BAG) ×1 IMPLANT
BASKET BONE COLLECTION (BASKET) ×1 IMPLANT
BENZOIN TINCTURE PRP APPL 2/3 (GAUZE/BANDAGES/DRESSINGS) ×1 IMPLANT
BLADE CLIPPER SURG (BLADE) IMPLANT
BUR MATCHSTICK NEURO 3.0 LAGG (BURR) ×1 IMPLANT
BUR PRECISION FLUTE 6.0 (BURR) ×1 IMPLANT
CAGE ALTERA 10X31X9-13 15D (Cage) IMPLANT
CANISTER SUCT 3000ML PPV (MISCELLANEOUS) ×1 IMPLANT
CAP LOCK DLX THRD (Cap) IMPLANT
CLEANSER WND VASHE INSTL 34OZ (WOUND CARE) ×1 IMPLANT
CNTNR URN SCR LID CUP LEK RST (MISCELLANEOUS) ×1 IMPLANT
COVER BACK TABLE 60X90IN (DRAPES) ×1 IMPLANT
DRAPE C-ARM 42X72 X-RAY (DRAPES) ×2 IMPLANT
DRAPE HALF SHEET 40X57 (DRAPES) ×1 IMPLANT
DRAPE LAPAROTOMY 100X72X124 (DRAPES) ×1 IMPLANT
DRAPE SURG 17X23 STRL (DRAPES) ×1 IMPLANT
DRSG OPSITE POSTOP 4X6 (GAUZE/BANDAGES/DRESSINGS) ×1 IMPLANT
DRSG TEGADERM 4X4.75 (GAUZE/BANDAGES/DRESSINGS) IMPLANT
ELECT BLADE 4.0 EZ CLEAN MEGAD (MISCELLANEOUS) ×1 IMPLANT
ELECT REM PT RETURN 9FT ADLT (ELECTROSURGICAL) ×1 IMPLANT
ELECTRODE BLDE 4.0 EZ CLN MEGD (MISCELLANEOUS) ×1 IMPLANT
ELECTRODE REM PT RTRN 9FT ADLT (ELECTROSURGICAL) ×1 IMPLANT
EVACUATOR 1/8 PVC DRAIN (DRAIN) IMPLANT
GAUZE 4X4 16PLY ~~LOC~~+RFID DBL (SPONGE) ×1 IMPLANT
GAUZE SPONGE 4X4 12PLY STRL (GAUZE/BANDAGES/DRESSINGS) IMPLANT
GLOVE BIO SURGEON STRL SZ 6 (GLOVE) ×1 IMPLANT
GLOVE BIO SURGEON STRL SZ8 (GLOVE) ×2 IMPLANT
GLOVE BIO SURGEON STRL SZ8.5 (GLOVE) ×2 IMPLANT
GLOVE BIOGEL PI IND STRL 6.5 (GLOVE) ×1 IMPLANT
GLOVE EXAM NITRILE XL STR (GLOVE) IMPLANT
GOWN STRL REUS W/ TWL LRG LVL3 (GOWN DISPOSABLE) ×1 IMPLANT
GOWN STRL REUS W/ TWL XL LVL3 (GOWN DISPOSABLE) ×2 IMPLANT
GOWN STRL REUS W/TWL 2XL LVL3 (GOWN DISPOSABLE) IMPLANT
HEMOSTAT POWDER KIT SURGIFOAM (HEMOSTASIS) ×1 IMPLANT
KIT BASIN OR (CUSTOM PROCEDURE TRAY) ×1 IMPLANT
KIT GRAFTMAG DEL NEURO DISP (NEUROSURGERY SUPPLIES) IMPLANT
KIT POSITION SURG JACKSON T1 (MISCELLANEOUS) ×1 IMPLANT
KIT TURNOVER KIT B (KITS) ×1 IMPLANT
NDL HYPO 21X1.5 SAFETY (NEEDLE) ×1 IMPLANT
NDL HYPO 22X1.5 SAFETY MO (MISCELLANEOUS) ×1 IMPLANT
NEEDLE HYPO 21X1.5 SAFETY (NEEDLE) ×1 IMPLANT
NEEDLE HYPO 22X1.5 SAFETY MO (MISCELLANEOUS) ×1 IMPLANT
NS IRRIG 1000ML POUR BTL (IV SOLUTION) ×1 IMPLANT
PACK LAMINECTOMY NEURO (CUSTOM PROCEDURE TRAY) ×1 IMPLANT
PAD ARMBOARD 7.5X6 YLW CONV (MISCELLANEOUS) ×3 IMPLANT
PATTIES SURGICAL .5 X1 (DISPOSABLE) IMPLANT
PUTTY DBM 10CC CALC GRAN (Putty) IMPLANT
ROD CURVED TI 6.35X45 (Rod) IMPLANT
SCREW PA DLX CREO 7.5X50 (Screw) IMPLANT
SPIKE FLUID TRANSFER (MISCELLANEOUS) ×1 IMPLANT
SPONGE NEURO XRAY DETECT 1X3 (DISPOSABLE) IMPLANT
SPONGE SURGIFOAM ABS GEL 100 (HEMOSTASIS) IMPLANT
SPONGE T-LAP 4X18 ~~LOC~~+RFID (SPONGE) IMPLANT
STRIP CLOSURE SKIN 1/2X4 (GAUZE/BANDAGES/DRESSINGS) ×1 IMPLANT
SUT VIC AB 1 CT1 18XBRD ANBCTR (SUTURE) ×2 IMPLANT
SUT VIC AB 2-0 CP2 18 (SUTURE) ×2 IMPLANT
SYR 20ML LL LF (SYRINGE) IMPLANT
TOWEL GREEN STERILE (TOWEL DISPOSABLE) ×1 IMPLANT
TOWEL GREEN STERILE FF (TOWEL DISPOSABLE) ×1 IMPLANT
TRAY FOLEY MTR SLVR 16FR STAT (SET/KITS/TRAYS/PACK) ×1 IMPLANT
WATER STERILE IRR 1000ML POUR (IV SOLUTION) ×1 IMPLANT

## 2024-02-08 NOTE — H&P (Signed)
 Subjective: The patient is a 68 year old white female on whom I performed a previous lumbar fusion.  She has developed recurrent back and right greater left leg pain consistent with neurogenic claudication/lumbar radiculopathy.  She failed medical management.  She was worked up with a lumbar mild CT which demonstrated a spondylolisthesis and spinal stenosis at L1-2.  I discussed the various treatment options with her.  She has decided proceed with surgery.  Past Medical History:  Diagnosis Date   Allergy    Anal fissure    Anxiety    a.) on BZO (alprazolam) PRN   Aortic atherosclerosis (HCC)    Arthritis    B12 deficiency    Basal cell carcinoma (BCC) of skin of nose    Chronic back pain    Degenerative disc disease, lumbar    Depression    DOE (dyspnea on exertion)    Dysrhythmia    Sinus Tachycardia - on Metoprolol   Female bladder prolapse    Gallstones    GERD (gastroesophageal reflux disease)    Headache    Hx of migraines, but none in a long time (as of 2025)   Heart murmur    Hx of colonic polyp    Hx: UTI (urinary tract infection)    Hypercholesterolemia    Hypertension    Long term current use of aspirin    Lumbar stenosis    Panic attacks    Wears partial dentures     Past Surgical History:  Procedure Laterality Date   ABDOMINAL HYSTERECTOMY  1992   partial   CARPOMETACARPAL (CMC) FUSION OF THUMB Left 05/25/2023   Procedure: SUSPENSION ARTHROPLASTY OF LEFT THUMB CMC JOINT;  Surgeon: Christena Flake, MD;  Location: [facility];  Service: Orthopedics;  Laterality: Left;   CATARACT EXTRACTION Bilateral [id number]   CHOLECYSTECTOMY  04/19/2014   COLONOSCOPY  07/02/2011   DR. Byrnett   COLONOSCOPY WITH PROPOFOL N/A 09/25/2015   Procedure: COLONOSCOPY WITH PROPOFOL;  Surgeon: Midge Minium, MD;  Location: [facility];  Service: Endoscopy;  Laterality: N/A;   FOOT SURGERY Right 2009   LUMBAR FUSION  2018   MOUTH SURGERY     dental implants, veneers   POLYPECTOMY   09/25/2015   Procedure: POLYPECTOMY;  Surgeon: Midge Minium, MD;  Location: [facility];  Service: Endoscopy;;   skin surgery Left 08/27/2013   Basal cell removal-left nostril   TONSILLECTOMY AND ADENOIDECTOMY  1969    Allergies  Allergen Reactions   Doxycycline Nausea And Vomiting   Gadolinium Nausea And Vomiting   Gadolinium Derivatives Nausea And Vomiting   Iodinated Contrast Media Nausea And Vomiting and Other (See Comments)     Eyes turned blood red  During a CT scan of the kidneys    Levofloxacin     Muscle Pain   Silicone     "Pulls skin"; anything that does not pull is recommended   Adhesive [Tape] Other (See Comments)    Pulls skin, anything that does not pull is recommended     Social History   Tobacco Use   Smoking status: Every Day    Current packs/day: 1.00    Average packs/day: 1 pack/day for 47.4 years (47.4 ttl pk-yrs)    Types: Cigarettes    Start date: 2013    Last attempt to quit: 1975   Smokeless tobacco: Never   Tobacco comments:    Smokes 1 PPD  Substance Use Topics   Alcohol use: Not Currently    Comment: Rarely  Family History  Problem Relation Age of Onset   Hyperlipidemia Mother    Hypertension Mother    Alcohol abuse Father    Hyperlipidemia Father    Heart disease Father    Diabetes Father    Hyperlipidemia Sister    Lung cancer Brother    Hyperlipidemia Brother    Breast cancer Neg Hx    Prior to Admission medications   Medication Sig Start Date End Date Taking? Authorizing Provider  albuterol (VENTOLIN HFA) 108 (90 Base) MCG/ACT inhaler Inhale 2 puffs into the lungs every 6 (six) hours as needed for wheezing or shortness of breath. 01/31/24  Yes Dale [place], MD  Alirocumab (PRALUENT) 75 MG/ML SOAJ Inject 1 pen (75 mg) into the skin every 14 days. 08/23/23  Yes Dale Henning, MD  ALPRAZolam Prudy Feeler) 0.5 MG tablet Take 1 tablet (0.5 mg total) by mouth daily as needed for anxiety. 01/03/24  Yes Dale [place], MD  aspirin  EC 81 MG tablet Take 1 tablet (81 mg total) by mouth daily. Swallow whole. 03/14/21  Yes Dale [place], MD  cyclobenzaprine (FLEXERIL) 10 MG tablet Take 5-10 mg by mouth 2 (two) times daily as needed for muscle spasms. 01/23/24  Yes [provider]  FLUoxetine (PROZAC) 40 MG capsule Take 1 capsule (40 mg total) by mouth daily. 11/01/23  Yes Dale [place], MD  HYDROcodone-acetaminophen (NORCO) 7.5-325 MG tablet Take 1-2 tablets by mouth every 6 (six) hours as needed. Using 1-2 times daily 05/25/23  Yes Poggi, Excell Seltzer, MD  meloxicam (MOBIC) 15 MG tablet Take 1 tablet (15 mg total) by mouth daily. 01/03/24  Yes Dale [place], MD  methocarbamol (ROBAXIN) 500 MG tablet Take 1 tablet (500 mg total) by mouth every 8 (eight) hours as needed. 01/01/24  Yes Janith Lima, MD  metoprolol succinate (TOPROL-XL) 25 MG 24 hr tablet Take 1 tablet (25 mg total) by mouth daily. Take with or immediately following a meal. 07/18/23 02/08/24 Yes Dunn, Raymon Mutton, PA-C  pantoprazole (PROTONIX) 40 MG tablet Take 1 tablet (40 mg total) by mouth daily. 11/01/23  Yes Dale [place], MD  potassium chloride (KLOR-CON) 10 MEQ tablet Take 1 tablet (10 mEq total) by mouth daily. 11/01/23  Yes Dale [place], MD  pregabalin (LYRICA) 75 MG capsule Take 150 mg by mouth daily.   Yes [provider]  triamterene-hydrochlorothiazide (MAXZIDE-25) 37.5-25 MG tablet 1/2 tablet per day 12/08/23  Yes Dale [place], MD     Review of Systems  Positive ROS: As above  All other systems have been reviewed and were otherwise negative with the exception of those mentioned in the HPI and as above.  Objective: Vital signs in last 24 hours: Temp:  [98.2 F (36.8 C)] 98.2 F (36.8 C) (03/05 0646) Pulse Rate:  [87] 87 (03/05 0646) Resp:  [18] 18 (03/05 0646) BP: (125)/(84) 125/84 (03/05 0646) SpO2:  [91 %] 91 % (03/05 0646) Weight:  [83.9 kg] 83.9 kg (03/05 0646) Estimated body mass index is 30.79 kg/m as calculated from the  following:   Height as of this encounter: 5\' 5"  (1.651 m).   Weight as of this encounter: 83.9 kg.   General Appearance: Alert Head: Normocephalic, without obvious abnormality, atraumatic Eyes: PERRL, conjunctiva/corneas clear, EOM's intact,    Ears: Normal  Throat: Normal  Neck: Supple, Back: Her lumbar incision is well-healed. Lungs: Clear to auscultation bilaterally, respirations unlabored Heart: Regular rate and rhythm, no murmur, rub or gallop Abdomen: Soft, non-tender Extremities: Extremities normal, atraumatic, no cyanosis or  edema Skin: unremarkable  NEUROLOGIC:   Mental status: alert and oriented,Motor Exam - grossly normal Sensory Exam - grossly normal Reflexes:  Coordination - grossly normal Gait - grossly normal Balance - grossly normal Cranial Nerves: I: smell Not tested  II: visual acuity  OS: Normal  OD: Normal   II: visual fields Full to confrontation  II: pupils Equal, round, reactive to light  III,VII: ptosis None  III,IV,VI: extraocular muscles  Full ROM  V: mastication Normal  V: facial light touch sensation  Normal  V,VII: corneal reflex  Present  VII: facial muscle function - upper  Normal  VII: facial muscle function - lower Normal  VIII: hearing Not tested  IX: soft palate elevation  Normal  IX,X: gag reflex Present  XI: trapezius strength  5/5  XI: sternocleidomastoid strength 5/5  XI: neck flexion strength  5/5  XII: tongue strength  Normal    Data Review Lab Results  Component Value Date   WBC 12.0 (H) 02/03/2024   HGB 13.9 02/03/2024   HCT 40.9 02/03/2024   MCV 88.3 02/03/2024   PLT 323 02/03/2024   Lab Results  Component Value Date   NA 136 02/07/2024   K 3.9 02/07/2024   CL 100 02/07/2024   CO2 27 02/07/2024   BUN 18 02/07/2024   CREATININE 1.19 02/07/2024   GLUCOSE 114 (H) 02/07/2024   No results found for: "INR", "PROTIME"  Assessment/Plan: Lumbar spondylosis, lumbar spinal stenosis, neurogenic claudication, lumbar  radiculopathy, lumbago: I have discussed the situation with the patient.  I have reviewed her imaging studies with her and pointed out the abnormalities.  We have discussed the various treatment options including surgery.  I have described the surgical treatment option of an L1-2 decompression, instrumentation and fusion.  I have shown her surgical models.  I have given her surgical pamphlet.  We have discussed the risk, benefits, alternatives, expected postoperative course, and likelihood of achieving our goals with surgery.  I have answered all her questions.  She has decided proceed with surgery.   Cristi Loron 02/08/2024 8:48 AM

## 2024-02-08 NOTE — Progress Notes (Signed)
 Orthopedic Tech Progress Note Patient Details:  Angel Riddle 11-04-1956 [id number]  RN stated patient has back brace   Patient ID: Angel Riddle, female   DOB: 04-30-1956, 68 y.o.   MRN: [id number]  [name] 02/08/2024, 5:30 PM

## 2024-02-08 NOTE — Transfer of Care (Signed)
 Immediate Anesthesia Transfer of Care Note  Patient: Angel Riddle  Procedure(s) Performed: Posterior Lumbar Interbody Fusion , Instrumented Prothesis, Posterior Instrumentation  Lumbar one-two (Spine Lumbar)  Patient Location: PACU  Anesthesia Type:General  Level of Consciousness: awake and sedated  Airway & Oxygen Therapy: Patient Spontanous Breathing and Patient connected to face mask oxygen  Post-op Assessment: Report given to RN and Post -op Vital signs reviewed and stable  Post vital signs: Reviewed and stable  Last Vitals:  Vitals Value Taken Time  BP 140/71 02/08/24 1316  Temp    Pulse 114 02/08/24 1325  Resp 14 02/08/24 1325  SpO2 96 % 02/08/24 1325  Vitals shown include unfiled device data.  Last Pain:  Vitals:   02/08/24 0715  PainSc: 0-No pain     Wheezing on entering PACU. Dr. Richardson Landry called and albuterol treatment ordered    Complications: No notable events documented.

## 2024-02-08 NOTE — Anesthesia Postprocedure Evaluation (Signed)
 Anesthesia Post Note  Patient: Angel Riddle  Procedure(s) Performed: Posterior Lumbar Interbody Fusion , Instrumented Prothesis, Posterior Instrumentation  Lumbar one-two (Spine Lumbar)     Patient location during evaluation: PACU Anesthesia Type: General Level of consciousness: awake and alert Pain management: pain level controlled Vital Signs Assessment: post-procedure vital signs reviewed and stable Respiratory status: spontaneous breathing, nonlabored ventilation, respiratory function stable and patient connected to nasal cannula oxygen Cardiovascular status: blood pressure returned to baseline and stable Postop Assessment: no apparent nausea or vomiting Anesthetic complications: no  No notable events documented.  Last Vitals:  Vitals:   02/08/24 1445 02/08/24 1501  BP: 97/67 92/62  Pulse: (!) 101   Resp: 17   Temp:    SpO2: 94%     Last Pain:  Vitals:   02/08/24 1315  PainSc: Asleep                 Trevor Iha

## 2024-02-08 NOTE — Op Note (Signed)
 Brief history: The patient is a 68 year old white female on whom I previous performed an L3-4 and L4-5 decompression, instrumentation and fusion.  She has had worsening back pain into her right leg.  She failed medical management.  She was worked up with a lumbar mild CT which demonstrated L1-2 spondylolisthesis, disc injection, stenosis, etc.  I discussed the various treatment options with her.  She has decided proceed with surgery.  Preoperative diagnosis: L1-2 spondylolisthesis, degenerative disc disease, spinal stenosis compressing both the L1 and the L2 nerve roots; lumbago; lumbar radiculopathy; neurogenic claudication  Postoperative diagnosis: The same  Procedure: Bilateral L1-2 laminotomy/foraminotomies/medial facetectomy to decompress the bilateral L1 and L2 nerve roots(the work required to do this was in addition to the work required to do the posterior lumbar interbody fusion because of the patient's spinal stenosis, facet arthropathy. Etc. requiring a wide decompression of the nerve roots.); right L1-2 transforaminal lumbar interbody fusion with local morselized autograft bone and Zimmer DBM; insertion of interbody prosthesis at L1-2 from the right (globus peek expandable interbody prosthesis); posterior nonsegmental instrumentation from L1 to L2 with globus titanium pedicle screws and rods; posterior lateral arthrodesis at L1-2 with local morselized autograft bone and Zimmer DBM.  Surgeon: Dr. Delma Officer  Asst.: Hildred Priest, NP  Anesthesia: Gen. endotracheal  Estimated blood loss: 150 cc  Drains: Hemovac drain in the epidural space  Complications: None  Description of procedure: The patient was brought to the operating room by the anesthesia team. General endotracheal anesthesia was induced. The patient was turned to the prone position on the Wilson frame. The patient's lumbosacral region was then prepared with Betadine scrub and Betadine solution. Sterile drapes were  applied.  I then injected the area to be incised with Marcaine with epinephrine solution. I then used the scalpel to make a linear midline incision over the L1-2 interspace. I then used electrocautery to perform a bilateral subperiosteal dissection exposing the spinous process and lamina of L1-2 bilaterally. We then obtained intraoperative radiograph to confirm our location. We then inserted the Verstrac retractor to provide exposure.  I began the decompression by using the high speed drill to perform laminotomies at L1-2 bilaterally. We then used the Kerrison punches to widen the laminotomy and removed the ligamentum flavum at L1-2 bilaterally. We used the Kerrison punches to remove the medial facets at L1-2 bilaterally, we removed the facets bilaterally. We performed wide foraminotomies about the bilateral L1-2 nerve roots completing the decompression.  We now turned our attention to the posterior lumbar interbody fusion. I used a scalpel to incise the intervertebral disc at L1-2 bilaterally. I then performed a partial intervertebral discectomy at L1-2 bilaterally using the pituitary forceps. We prepared the vertebral endplates at L1-2 bilaterally for the fusion by removing the soft tissues with the curettes. We then used the trial spacers to pick the appropriate sized interbody prosthesis. We prefilled his prosthesis with a combination of local morselized autograft bone that we obtained during the decompression as well as Zimmer DBM. We inserted the prefilled prosthesis into the interspace at L1-2 from the right, we then turned and expanded the prosthesis. There was a good snug fit of the prosthesis in the interspace. We then filled and the remainder of the intervertebral disc space with local morselized autograft bone and Zimmer DBM. This completed the posterior lumbar interbody arthrodesis.  During the decompression and insertion of the prosthesis the assistant protected the thecal sac and nerve roots  with the D'Errico retractor.  We now turned attention  to the instrumentation. Under fluoroscopic guidance we cannulated the bilateral L1 and L2 pedicles with the bone probe. We then removed the bone probe. We then tapped the pedicle with a 6.5 millimeter tap. We then removed the tap. We probed inside the tapped pedicle with a ball probe to rule out cortical breaches. We then inserted a 7.5 x 50 millimeter pedicle screw into the L1 and L2 pedicles bilaterally under fluoroscopic guidance. We then palpated along the medial aspect of the pedicles to rule out cortical breaches. There were none. The nerve roots were not injured. We then connected the unilateral pedicle screws with a lordotic rod. We compressed the construct and secured the rod in place with the caps. We then tightened the caps appropriately. This completed the instrumentation from L1-2 bilaterally.  We now turned our attention to the posterior lateral arthrodesis at L1-2 bilaterally. We used the high-speed drill to decorticate the remainder of the facets, pars, transverse process at L1-2 bilaterally. We then applied a combination of local morselized autograft bone and Zimmer DBM over these decorticated posterior lateral structures. This completed the posterior lateral arthrodesis.  We then obtained hemostasis using bipolar electrocautery. We irrigated the wound out with vashe solution. We inspected the thecal sac and nerve roots and noted they were well decompressed. We then removed the retractor.  We placed a medium Hemovac drain in the epidural space and tunneled it out through a separate stab wound.  We injected Exparel . We reapproximated patient's thoracolumbar fascia with interrupted #1 Vicryl suture. We reapproximated patient's subcutaneous tissue with interrupted 2-0 Vicryl suture. The reapproximated patient's skin with Steri-Strips and benzoin. The wound was then coated with bacitracin ointment. A sterile dressing was applied. The drapes  were removed. The patient was subsequently returned to the supine position where they were extubated by the anesthesia team. He was then transported to the post anesthesia care unit in stable condition. All sponge instrument and needle counts were reportedly correct at the end of this case.

## 2024-02-08 NOTE — Anesthesia Procedure Notes (Signed)
 Procedure Name: Intubation Date/Time: 02/08/2024 9:09 AM  Performed by: Alwyn Ren, CRNAPre-anesthesia Checklist: Patient identified, Emergency Drugs available, Suction available and Patient being monitored Patient Re-evaluated:Patient Re-evaluated prior to induction Oxygen Delivery Method: Circle system utilized Preoxygenation: Pre-oxygenation with 100% oxygen Induction Type: IV induction Ventilation: Mask ventilation without difficulty Laryngoscope Size: Glidescope and 3 Grade View: Grade I Tube type: Oral Tube size: 7.0 mm Number of attempts: 1 Airway Equipment and Method: Stylet and Oral airway Placement Confirmation: ETT inserted through vocal cords under direct vision, positive ETCO2 and breath sounds checked- equal and bilateral Secured at: 22 cm Tube secured with: Tape Dental Injury: Teeth and Oropharynx as per pre-operative assessment

## 2024-02-09 DIAGNOSIS — M48062 Spinal stenosis, lumbar region with neurogenic claudication: Secondary | ICD-10-CM | POA: Diagnosis not present

## 2024-02-09 MED ORDER — DOCUSATE SODIUM 100 MG PO CAPS: 100.0000 mg | ORAL_CAPSULE | Freq: Two times a day (BID) | ORAL | 0 refills | Status: DC

## 2024-02-09 MED ORDER — OXYCODONE-ACETAMINOPHEN 5-325 MG PO TABS: 1.0000 | ORAL_TABLET | ORAL | Status: DC | PRN

## 2024-02-09 MED ORDER — OXYCODONE-ACETAMINOPHEN 5-325 MG PO TABS: 1.0000 | ORAL_TABLET | ORAL | 0 refills | Status: DC | PRN

## 2024-02-09 NOTE — Progress Notes (Signed)
 Patient alert and oriented, void, ambulate. Surgical site clean and dry no sign of infection. D/c instructions explain and given all questions answered.

## 2024-02-09 NOTE — Evaluation (Signed)
 Physical Therapy Evaluation  Patient Details Name: Angel Riddle MRN: [id number] DOB: 08-22-1956 Today's Date: 02/09/2024  History of Present Illness  Pt is a 68 y/o female who presents s/p L1-L2 PLIF on 02/08/2024. PMH significant for DDD, bladder prolapse, heart murmur, HTN, panic attacks, CMC fusion of thumb L, lumbar fusion 2018.  Clinical Impression  Pt admitted with above diagnosis. At the time of PT eval, pt was able to demonstrate transfers and ambulation with gross supervision for safety and no AD. Pt was educated on precautions, brace application/wearing schedule, appropriate activity progression, and car transfer. Pt currently with functional limitations due to the deficits listed below (see PT Problem List). Pt will benefit from skilled PT to increase their independence and safety with mobility to allow discharge to the venue listed below.          If plan is discharge home, recommend the following: A little help with walking and/or transfers;A little help with bathing/dressing/bathroom;Assistance with cooking/housework;Assist for transportation;Help with stairs or ramp for entrance   Can travel by private vehicle        Equipment Recommendations None recommended by PT  Recommendations for Other Services       Functional Status Assessment Patient has had a recent decline in their functional status and demonstrates the ability to make significant improvements in function in a reasonable and predictable amount of time.     Precautions / Restrictions Precautions Precautions: Fall;Back Precaution Booklet Issued: Yes (comment) Recall of Precautions/Restrictions: Intact Precaution/Restrictions Comments: Reviewed handout and pt was cued for precautions during functional mobility. Required Braces or Orthoses: Spinal Brace Spinal Brace: Lumbar corset;Applied in sitting position Restrictions Weight Bearing Restrictions Per Provider Order: No      Mobility  Bed Mobility                General bed mobility comments: Pt was received exiting bathroom upon entry. Verbally discussed log roll technique. Pt reports she has been doing this without difficulty.    Transfers Overall transfer level: Needs assistance Equipment used: None Transfers: Sit to/from Stand Sit to Stand: Supervision           General transfer comment: VC's for hand placement on seated surface for safety and wide BOS.    Ambulation/Gait Ambulation/Gait assistance: Supervision Gait Distance (Feet): 400 Feet Assistive device: None Gait Pattern/deviations: Step-through pattern, Decreased stride length, Wide base of support Gait velocity: Decreased Gait velocity interpretation: 1.31 - 2.62 ft/sec, indicative of limited community ambulator   General Gait Details: VC's for improved posture, closer walker proximity and forward gaze. No assist required.  Stairs            Wheelchair Mobility     Tilt Bed    Modified Rankin (Stroke Patients Only)       Balance Overall balance assessment: Mild deficits observed, not formally tested                                           Pertinent Vitals/Pain Pain Assessment Pain Assessment: Faces Faces Pain Scale: Hurts even more Pain Location: Incision site Pain Descriptors / Indicators: Operative site guarding, Sore Pain Intervention(s): Limited activity within patient's tolerance, Monitored during session, Repositioned    Home Living Family/patient expects to be discharged to:: Private residence Living Arrangements: Alone Available Help at Discharge: Family;Available 24 hours/day Type of Home: Other(Comment) (Condo) Home Access: Level entry  Home Layout: One level Home Equipment: Agricultural consultant (2 wheels);Rollator (4 wheels);Cane - single point;Shower seat      Prior Function Prior Level of Function : Independent/Modified Independent                     Extremity/Trunk Assessment    Upper Extremity Assessment Upper Extremity Assessment: Overall WFL for tasks assessed    Lower Extremity Assessment Lower Extremity Assessment: Generalized weakness (Mild; consistent with pre-op diagnosis)    Cervical / Trunk Assessment Cervical / Trunk Assessment: Back Surgery  Communication   Communication Communication: No apparent difficulties    Cognition Arousal: Alert Behavior During Therapy: WFL for tasks assessed/performed   PT - Cognitive impairments: No apparent impairments                         Following commands: Intact       Cueing Cueing Techniques: Verbal cues, Gestural cues     General Comments      Exercises     Assessment/Plan    PT Assessment Patient needs continued PT services  PT Problem List Decreased strength;Decreased activity tolerance;Decreased balance;Decreased mobility;Decreased knowledge of use of DME;Decreased knowledge of precautions;Decreased safety awareness;Pain       PT Treatment Interventions DME instruction;Gait training;Functional mobility training;Therapeutic activities;Therapeutic exercise;Balance training;Patient/family education    PT Goals (Current goals can be found in the Care Plan section)  Acute Rehab PT Goals Patient Stated Goal: Home today PT Goal Formulation: With patient Time For Goal Achievement: 02/16/24 Potential to Achieve Goals: Good    Frequency Min 5X/week     Co-evaluation               AM-PAC PT "6 Clicks" Mobility  Outcome Measure Help needed turning from your back to your side while in a flat bed without using bedrails?: A Little Help needed moving from lying on your back to sitting on the side of a flat bed without using bedrails?: A Little Help needed moving to and from a bed to a chair (including a wheelchair)?: A Little Help needed standing up from a chair using your arms (e.g., wheelchair or bedside chair)?: A Little Help needed to walk in hospital room?: A Little Help  needed climbing 3-5 steps with a railing? : A Little 6 Click Score: 18    End of Session Equipment Utilized During Treatment: Gait belt;Back brace Activity Tolerance: Patient tolerated treatment well Patient left: in bed;with call bell/phone within reach;with family/visitor present Nurse Communication: Mobility status PT Visit Diagnosis: Unsteadiness on feet (R26.81);Pain Pain - part of body:  (back)    Time: 9147-8295 PT Time Calculation (min) (ACUTE ONLY): 20 min   Charges:   PT Evaluation $PT Eval Low Complexity: 1 Low   PT General Charges $$ ACUTE PT VISIT: 1 Visit         Conni Slipper, PT, DPT Acute Rehabilitation Services Secure Chat Preferred Office: [phone]   Marylynn Pearson 02/09/2024, 11:50 AM

## 2024-02-09 NOTE — Discharge Summary (Signed)
 Physician Discharge Summary  Patient ID: Angel Riddle MRN: [id number] DOB/AGE: September 14, 1956 68 y.o.  Admit date: 02/08/2024 Discharge date: 02/09/2024  Admission Diagnoses: Lumbar facet arthropathy, lumbar spinal stenosis, lumbar spinal stenosis, lumbago, lumbar radiculopathy, neurogenic claudication  Discharge Diagnoses: Same Principal Problem:   Spondylolisthesis of lumbar region   Discharged Condition: good  Hospital Course: I performed an L1-2 decompression, instrumentation and fusion on the patient on 02/08/2024.  The surgery went well.  The patient's postoperative course was unremarkable.  On postoperative day 1 the patient felt well and requested discharge home.  The patient was given verbal and written discharge instructions.  All her questions were answered.  Consults: PT, care management Significant Diagnostic Studies: None Treatments: L1-2 decompression, instrumentation and fusion. Discharge Exam: Blood pressure 117/67, pulse 92, temperature 98.4 F (36.9 C), temperature source Oral, resp. rate 16, height 5\' 5"  (1.651 m), weight 83.9 kg, SpO2 94%. The patient is alert and pleasant.  She looks well.  Her strength is normal.  Disposition: Home  Discharge Instructions     Call MD for:  difficulty breathing, headache or visual disturbances   Complete by: As directed    Call MD for:  extreme fatigue   Complete by: As directed    Call MD for:  hives   Complete by: As directed    Call MD for:  persistant dizziness or light-headedness   Complete by: As directed    Call MD for:  persistant nausea and vomiting   Complete by: As directed    Call MD for:  redness, tenderness, or signs of infection (pain, swelling, redness, odor or green/yellow discharge around incision site)   Complete by: As directed    Call MD for:  severe uncontrolled pain   Complete by: As directed    Call MD for:  temperature >100.4   Complete by: As directed    Diet - low sodium heart healthy    Complete by: As directed    Discharge instructions   Complete by: As directed    Call [phone] for a followup appointment. Take a stool softener while you are using pain medications.   Driving Restrictions   Complete by: As directed    Do not drive for 2 weeks.   Increase activity slowly   Complete by: As directed    Lifting restrictions   Complete by: As directed    Do not lift more than 5 pounds. No excessive bending or twisting.   May shower / Bathe   Complete by: As directed    Remove the dressing for 3 days after surgery.  You may shower, but leave the incision alone.   Remove dressing in 48 hours   Complete by: As directed       Allergies as of 02/09/2024       Reactions   Doxycycline Nausea And Vomiting   Gadolinium Nausea And Vomiting   Gadolinium Derivatives Nausea And Vomiting   Iodinated Contrast Media Nausea And Vomiting, Other (See Comments)    Eyes turned blood red During a CT scan of the kidneys    Levofloxacin    Muscle Pain   Silicone    "Pulls skin"; anything that does not pull is recommended   Adhesive [tape] Other (See Comments)   Pulls skin, anything that does not pull is recommended         Medication List     STOP taking these medications    HYDROcodone-acetaminophen 7.5-325 MG tablet Commonly known as: NORCO  meloxicam 15 MG tablet Commonly known as: MOBIC       TAKE these medications    albuterol 108 (90 Base) MCG/ACT inhaler Commonly known as: VENTOLIN HFA Inhale 2 puffs into the lungs every 6 (six) hours as needed for wheezing or shortness of breath.   ALPRAZolam 0.5 MG tablet Commonly known as: XANAX Take 1 tablet (0.5 mg total) by mouth daily as needed for anxiety.   aspirin EC 81 MG tablet Take 1 tablet (81 mg total) by mouth daily. Swallow whole.   cyclobenzaprine 10 MG tablet Commonly known as: FLEXERIL Take 5-10 mg by mouth 2 (two) times daily as needed for muscle spasms.   docusate sodium 100 MG  capsule Commonly known as: COLACE Take 1 capsule (100 mg total) by mouth 2 (two) times daily.   FLUoxetine 40 MG capsule Commonly known as: PROZAC Take 1 capsule (40 mg total) by mouth daily.   methocarbamol 500 MG tablet Commonly known as: ROBAXIN Take 1 tablet (500 mg total) by mouth every 8 (eight) hours as needed.   metoprolol succinate 25 MG 24 hr tablet Commonly known as: TOPROL-XL Take 1 tablet (25 mg total) by mouth daily. Take with or immediately following a meal.   oxyCODONE-acetaminophen 5-325 MG tablet Commonly known as: PERCOCET/ROXICET Take 1-2 tablets by mouth every 4 (four) hours as needed for moderate pain (pain score 4-6).   pantoprazole 40 MG tablet Commonly known as: PROTONIX Take 1 tablet (40 mg total) by mouth daily.   potassium chloride 10 MEQ tablet Commonly known as: KLOR-CON Take 1 tablet (10 mEq total) by mouth daily.   Praluent 75 MG/ML Soaj Generic drug: Alirocumab Inject 1 pen (75 mg) into the skin every 14 days.   pregabalin 75 MG capsule Commonly known as: LYRICA Take 150 mg by mouth daily.   triamterene-hydrochlorothiazide 37.5-25 MG tablet Commonly known as: MAXZIDE-25 1/2 tablet per day         Signed: Cristi Loron 02/09/2024, 10:10 AM

## 2024-02-10 MED FILL — Thrombin For Soln 5000 Unit: CUTANEOUS | Qty: 5000 | Status: AC

## 2024-02-15 MED FILL — Heparin Sodium (Porcine) Inj 1000 Unit/ML: INTRAMUSCULAR | Qty: 30 | Status: AC

## 2024-02-23 DIAGNOSIS — M4316 Spondylolisthesis, lumbar region: Secondary | ICD-10-CM | POA: Diagnosis not present

## 2024-02-28 DIAGNOSIS — M4316 Spondylolisthesis, lumbar region: Secondary | ICD-10-CM | POA: Diagnosis not present

## 2024-03-16 ENCOUNTER — Other Ambulatory Visit (HOSPITAL_COMMUNITY): Payer: Self-pay

## 2024-03-22 ENCOUNTER — Other Ambulatory Visit: Payer: Self-pay | Admitting: Internal Medicine

## 2024-03-26 ENCOUNTER — Ambulatory Visit: Payer: Self-pay

## 2024-03-26 ENCOUNTER — Other Ambulatory Visit: Payer: Self-pay | Admitting: Internal Medicine

## 2024-03-26 NOTE — Telephone Encounter (Signed)
 Copied from CRM [id number]. Topic: Clinical - Prescription Issue >> Mar 26, 2024 10:02 AM Chuck Crater wrote: Reason for CRM: Patient has a few ALPRAZolam  (XANAX ) 0.5 MG tablet left and is going out of town today and need her medication refilled before 6:00.  *Advised patient of 3 business day turn around time.

## 2024-03-26 NOTE — Telephone Encounter (Signed)
Pt aware.

## 2024-03-26 NOTE — Telephone Encounter (Signed)
Rx sent in for xanax.

## 2024-03-26 NOTE — Telephone Encounter (Signed)
 Xanax  refill  Last refill 01/03/24 #30 with 0 refill Last OV: 12/08/23 Next OV: 05/07/2024

## 2024-04-06 ENCOUNTER — Telehealth: Payer: Self-pay

## 2024-04-06 DIAGNOSIS — Z7982 Long term (current) use of aspirin: Secondary | ICD-10-CM | POA: Diagnosis not present

## 2024-04-06 DIAGNOSIS — F17210 Nicotine dependence, cigarettes, uncomplicated: Secondary | ICD-10-CM | POA: Diagnosis not present

## 2024-04-06 DIAGNOSIS — I2699 Other pulmonary embolism without acute cor pulmonale: Secondary | ICD-10-CM | POA: Diagnosis not present

## 2024-04-06 DIAGNOSIS — I10 Essential (primary) hypertension: Secondary | ICD-10-CM | POA: Diagnosis not present

## 2024-04-06 DIAGNOSIS — I2693 Single subsegmental pulmonary embolism without acute cor pulmonale: Secondary | ICD-10-CM | POA: Diagnosis not present

## 2024-04-06 DIAGNOSIS — Z716 Tobacco abuse counseling: Secondary | ICD-10-CM | POA: Diagnosis not present

## 2024-04-06 DIAGNOSIS — K219 Gastro-esophageal reflux disease without esophagitis: Secondary | ICD-10-CM | POA: Diagnosis not present

## 2024-04-06 DIAGNOSIS — R079 Chest pain, unspecified: Secondary | ICD-10-CM | POA: Diagnosis not present

## 2024-04-06 HISTORY — DX: Other pulmonary embolism without acute cor pulmonale: I26.99

## 2024-04-06 NOTE — Telephone Encounter (Signed)
 Copied from CRM [id number]. Topic: General - Other >> Apr 06, 2024  3:57 PM Angel Riddle wrote: Reason for CRM: patient called stating she is on vacation and she was having back pain on her right side in the ridge cage under her breast and in her shoulder. patient stated she went to the emergency room in [place], [place] and they ran a EKG, xray and CT with contrast and they found a blood clot in her right lung. Patient was given elquis 5mg  two twice a day for seven days and then one twice a day. Patient was also given oxycodone  as needed. Patient stated she will be back on 5/8 CB [phone]

## 2024-04-06 NOTE — Telephone Encounter (Signed)
 Spoke with pt and she was seen in the ED in [facility] because she was having right side pain. Pt was diagnosed with a blood clot in the right lung. She is being treated with Eliquis. I have scheduled pt for a follow up appt with you on 04/13/2024.

## 2024-04-13 ENCOUNTER — Encounter: Payer: Self-pay | Admitting: Internal Medicine

## 2024-04-13 ENCOUNTER — Ambulatory Visit (INDEPENDENT_AMBULATORY_CARE_PROVIDER_SITE_OTHER): Admitting: Internal Medicine

## 2024-04-13 ENCOUNTER — Ambulatory Visit
Admission: RE | Admit: 2024-04-13 | Discharge: 2024-04-13 | Disposition: A | Discharge status: Home / Self Care | Source: Ambulatory Visit | Attending: Internal Medicine | Admitting: Internal Medicine

## 2024-04-13 VITALS — BP 122/82 | HR 75 | Ht 65.0 in | Wt 181.6 lb

## 2024-04-13 DIAGNOSIS — F320 Major depressive disorder, single episode, mild: Secondary | ICD-10-CM | POA: Diagnosis not present

## 2024-04-13 DIAGNOSIS — Z86711 Personal history of pulmonary embolism: Secondary | ICD-10-CM | POA: Insufficient documentation

## 2024-04-13 DIAGNOSIS — I700 Atherosclerosis of aorta: Secondary | ICD-10-CM

## 2024-04-13 DIAGNOSIS — G8929 Other chronic pain: Secondary | ICD-10-CM | POA: Diagnosis not present

## 2024-04-13 DIAGNOSIS — I10 Essential (primary) hypertension: Secondary | ICD-10-CM | POA: Diagnosis not present

## 2024-04-13 DIAGNOSIS — M7989 Other specified soft tissue disorders: Secondary | ICD-10-CM | POA: Diagnosis not present

## 2024-04-13 DIAGNOSIS — M549 Dorsalgia, unspecified: Secondary | ICD-10-CM

## 2024-04-13 DIAGNOSIS — F419 Anxiety disorder, unspecified: Secondary | ICD-10-CM

## 2024-04-13 DIAGNOSIS — E7800 Pure hypercholesterolemia, unspecified: Secondary | ICD-10-CM

## 2024-04-13 MED ORDER — POTASSIUM CHLORIDE ER 10 MEQ PO TBCR
10.0000 meq | EXTENDED_RELEASE_TABLET | Freq: Every day | ORAL | 1 refills | Status: DC
Start: 2024-04-13 — End: 2024-10-08

## 2024-04-13 MED ORDER — FLUOXETINE HCL 40 MG PO CAPS: ORAL_CAPSULE | ORAL | 1 refills | Status: DC

## 2024-04-13 NOTE — Assessment & Plan Note (Signed)
 Recent diagnosis of PE. CT scan as outlined. No sob. Pain improved/resolved. Taking eliquis. Will contact cardiology regarding echo - to confirm no right heart strain.  With right leg larger than left and some calf tenderness, will obtain lower extremity ultrasound to confirm no DVT. Refer to hematology for w/up. Had recent surgery. Has been walking and trying to stay active. Continue eliquis.

## 2024-04-13 NOTE — Progress Notes (Signed)
 Subjective:    Patient ID: Angel Riddle, female    DOB: 11-12-1956, 68 y.o.   MRN: [id number]  Patient here for  Chief Complaint  Patient presents with   Hospitalization Follow-up    ER follow up     HPI Here for ER follow up. Evaluated ER ([facility]) - 04/06/24 - for right sided chest pain and increased belching. Also reported spilling coffee on her right breast two weeks prior to ER visit. CT - acute segmenta/subsegmental pulmonary emboli involving the RLL. Troponin - undetectable. No signs of heart strain on CTA. Ambulatory oxygen check without hypoxia. Was started on eliquis. Denies any sob. The previous pain - has resolved. Eating. No vomiting. Right leg is a little bigger than the left. Will notice some right calf pain, but has attributed this pain to her back. Prior to this, she was admitted 02/07/34 - L1-2 decompression, instrumentation and fusion 02/07/34.  No abdominal pain. Bowels moving.    Past Medical History:  Diagnosis Date   Allergy    Anal fissure    Anxiety    a.) on BZO (alprazolam ) PRN   Aortic atherosclerosis (HCC)    Arthritis    B12 deficiency    Basal cell carcinoma (BCC) of skin of nose    Chronic back pain    Degenerative disc disease, lumbar    Depression    DOE (dyspnea on exertion)    Dysrhythmia    Sinus Tachycardia - on Metoprolol    Female bladder prolapse    Gallstones    GERD (gastroesophageal reflux disease)    Headache    Hx of migraines, but none in a long time (as of 2025)   Heart murmur    Hx of colonic polyp    Hx: UTI (urinary tract infection)    Hypercholesterolemia    Hypertension    Long term current use of aspirin     Lumbar stenosis    Panic attacks    Wears partial dentures    Past Surgical History:  Procedure Laterality Date   ABDOMINAL HYSTERECTOMY  1992   partial   CARPOMETACARPAL (CMC) FUSION OF THUMB Left 05/25/2023   Procedure: SUSPENSION ARTHROPLASTY OF LEFT THUMB CMC JOINT;  Surgeon: Elner Hahn, MD;  Location: [facility]  [facility];  Service: Orthopedics;  Laterality: Left;   CATARACT EXTRACTION Bilateral [id number]   CHOLECYSTECTOMY  04/19/2014   COLONOSCOPY  07/02/2011   DR. Byrnett   COLONOSCOPY WITH PROPOFOL  N/A 09/25/2015   Procedure: COLONOSCOPY WITH PROPOFOL ;  Surgeon: Marnee Sink, MD;  Location: [facility];  Service: Endoscopy;  Laterality: N/A;   FOOT SURGERY Right 2009   LUMBAR FUSION  2018   MOUTH SURGERY     dental implants, veneers   POLYPECTOMY  09/25/2015   Procedure: POLYPECTOMY;  Surgeon: Marnee Sink, MD;  Location: [facility];  Service: Endoscopy;;   skin surgery Left 08/27/2013   Basal cell removal-left nostril   TONSILLECTOMY AND ADENOIDECTOMY  1969   Family History  Problem Relation Age of Onset   Hyperlipidemia Mother    Hypertension Mother    Alcohol abuse Father    Hyperlipidemia Father    Heart disease Father    Diabetes Father    Hyperlipidemia Sister    Lung cancer Brother    Hyperlipidemia Brother    Breast cancer Neg Hx    Social History   Socioeconomic History   Marital status: Widowed    Spouse name: Not on file   Number of children:  2   Years of education: Not on file   Highest education level: Some college, no degree  Occupational History   Not on file  Tobacco Use   Smoking status: Every Day    Current packs/day: 1.00    Average packs/day: 1 pack/day for 47.6 years (47.6 ttl pk-yrs)    Types: Cigarettes    Start date: 2013    Last attempt to quit: 1975   Smokeless tobacco: Never   Tobacco comments:    Smokes 1 PPD  Vaping Use   Vaping status: Never Used  Substance and Sexual Activity   Alcohol use: Not Currently    Comment: Rarely   Drug use: No    Comment: Delta 9 Gummies - in the evening   Sexual activity: Not Currently  Other Topics Concern   Not on file  Social History Narrative   Lives alone   Social Drivers of Health   Financial Resource Strain: Low Risk  (10/27/2023)   Received from [facility]    Overall Financial Resource Strain (CARDIA)    Difficulty of Paying Living Expenses: Not hard at all  Food Insecurity: No Food Insecurity (04/06/2024)   Received from [facility]    Hunger Vital Sign    Worried About Running Out of Food in the Last Year: Never true    Ran Out of Food in the Last Year: Never true  Transportation Needs: No Transportation Needs (04/06/2024)   Received from [facility]    [employer] - Transportation    Lack of Transportation (Medical): No    Lack of Transportation (Non-Medical): No  Physical Activity: Unknown (06/23/2023)   Exercise Vital Sign    Days of Exercise per Week: 0 days    Minutes of Exercise per Session: Not on file  Stress: No Stress Concern Present (06/23/2023)   [employer] of Occupational Health - Occupational Stress Questionnaire    Feeling of Stress : Only a little  Social Connections: Unknown (06/23/2023)   Social Connection and Isolation Panel [NHANES]    Frequency of Communication with Friends and Family: More than three times a week    Frequency of Social Gatherings with Friends and Family: Three times a week    Attends Religious Services: More than 4 times per year    Active Member of Clubs or Organizations: Not on file    Attends Banker Meetings: Not on file    Marital Status: Widowed     Review of Systems  Constitutional:  Negative for appetite change and unexpected weight change.  HENT:  Negative for congestion and sinus pressure.   Respiratory:  Negative for cough, chest tightness and shortness of breath.   Cardiovascular:  Negative for palpitations.       Chest pain improved/resolved.   Gastrointestinal:  Negative for abdominal pain, diarrhea, nausea and vomiting.  Genitourinary:  Negative for difficulty urinating and dysuria.  Musculoskeletal:  Positive for back pain. Negative for myalgias.       Right leg larger than left.   Skin:  Negative for color change and  rash.  Neurological:  Negative for dizziness, light-headedness and headaches.  Psychiatric/Behavioral:  Negative for agitation and dysphoric mood.        Objective:     BP 122/82   Pulse 75   Ht 5\' 5"  (1.651 m)   Wt 181 lb 9.6 oz (82.4 kg)   SpO2 98%   BMI 30.22 kg/m  Wt Readings from Last  3 Encounters:  04/13/24 181 lb 9.6 oz (82.4 kg)  02/08/24 185 lb (83.9 kg)  02/03/24 191 lb 12.8 oz (87 kg)    Physical Exam Vitals reviewed.  Constitutional:      General: She is not in acute distress.    Appearance: Normal appearance.  HENT:     Head: Normocephalic and atraumatic.     Right Ear: External ear normal.     Left Ear: External ear normal.     Mouth/Throat:     Pharynx: No oropharyngeal exudate or posterior oropharyngeal erythema.  Eyes:     General: No scleral icterus.       Right eye: No discharge.        Left eye: No discharge.     Conjunctiva/sclera: Conjunctivae normal.  Neck:     Thyroid : No thyromegaly.  Cardiovascular:     Rate and Rhythm: Normal rate and regular rhythm.  Pulmonary:     Effort: No respiratory distress.     Breath sounds: Normal breath sounds. No wheezing.  Abdominal:     General: Bowel sounds are normal.     Palpations: Abdomen is soft.     Tenderness: There is no abdominal tenderness.  Musculoskeletal:        General: No tenderness.     Cervical back: Neck supple. No tenderness.     Comments: Right leg larger than left.   Lymphadenopathy:     Cervical: No cervical adenopathy.  Skin:    Findings: No erythema or rash.  Neurological:     Mental Status: She is alert.  Psychiatric:        Mood and Affect: Mood normal.        Behavior: Behavior normal.         Outpatient Encounter Medications as of 04/13/2024  Medication Sig   albuterol  (VENTOLIN  HFA) 108 (90 Base) MCG/ACT inhaler Inhale 2 puffs into the lungs every 6 (six) hours as needed for wheezing or shortness of breath.   Alirocumab  (PRALUENT ) 75 MG/ML SOAJ Inject 1 pen (75  mg) into the skin every 14 days.   ALPRAZolam  (XANAX ) 0.5 MG tablet Take 1 tablet (0.5 mg total) by mouth daily as needed for anxiety.   apixaban (ELIQUIS) 5 MG TABS tablet Take 5 mg by mouth 2 (two) times daily.   methocarbamol  (ROBAXIN ) 500 MG tablet Take 1 tablet (500 mg total) by mouth every 8 (eight) hours as needed.   metoprolol  succinate (TOPROL -XL) 25 MG 24 hr tablet Take 1 tablet (25 mg total) by mouth daily. Take with or immediately following a meal.   oxyCODONE -acetaminophen  (PERCOCET) 7.5-325 MG tablet Take 1 tablet by mouth.   pantoprazole  (PROTONIX ) 40 MG tablet Take 1 tablet (40 mg total) by mouth daily.   pregabalin  (LYRICA ) 75 MG capsule Take 150 mg by mouth daily.   triamterene -hydrochlorothiazide  (MAXZIDE -25) 37.5-25 MG tablet 1/2 tablet per day   [DISCONTINUED] FLUoxetine  (PROZAC ) 40 MG capsule Take 1 capsule (40 mg total) by mouth daily.   [DISCONTINUED] potassium chloride  (KLOR-CON ) 10 MEQ tablet Take 1 tablet (10 mEq total) by mouth daily.   FLUoxetine  (PROZAC ) 40 MG capsule Take 1 capsule (40 mg total) by mouth daily.   oxyCODONE -acetaminophen  (PERCOCET/ROXICET) 5-325 MG tablet Take 1-2 tablets by mouth every 4 (four) hours as needed for moderate pain (pain score 4-6). (Patient not taking: Reported on 04/13/2024)   potassium chloride  (KLOR-CON ) 10 MEQ tablet Take 1 tablet (10 mEq total) by mouth daily.   [DISCONTINUED] aspirin  EC 81 MG  tablet Take 1 tablet (81 mg total) by mouth daily. Swallow whole. (Patient not taking: Reported on 04/13/2024)   [DISCONTINUED] cyclobenzaprine  (FLEXERIL ) 10 MG tablet Take 5-10 mg by mouth 2 (two) times daily as needed for muscle spasms. (Patient not taking: Reported on 04/13/2024)   [DISCONTINUED] docusate sodium  (COLACE) 100 MG capsule Take 1 capsule (100 mg total) by mouth 2 (two) times daily. (Patient not taking: Reported on 04/13/2024)   No facility-administered encounter medications on file as of 04/13/2024.     Lab Results  Component Value  Date   WBC 12.0 (H) 02/03/2024   HGB 13.9 02/03/2024   HCT 40.9 02/03/2024   PLT 323 02/03/2024   GLUCOSE 114 (H) 02/07/2024   CHOL 238 (H) 11/15/2023   TRIG 343.0 (H) 11/15/2023   HDL 50.40 11/15/2023   LDLDIRECT 150.0 03/25/2023   LDLCALC 119 (H) 11/15/2023   ALT 8 11/15/2023   AST 19 11/15/2023   NA 136 02/07/2024   K 3.9 02/07/2024   CL 100 02/07/2024   CREATININE 1.19 02/07/2024   BUN 18 02/07/2024   CO2 27 02/07/2024   TSH 0.72 11/15/2023   HGBA1C 6.1 11/15/2023    DG Lumbar Spine 2-3 Views Result Date: 02/08/2024 CLINICAL DATA:  67 year old female undergoing lumbar surgery. EXAM: LUMBAR SPINE - 2-3 VIEW COMPARISON:  Intraoperative images through 1024 hours today. Lumbar CT myelogram 08/22/2023. FINDINGS: 2 intraoperative fluoroscopic spot views of the spine demonstrate placement of posterior and interbody fusion hardware at L1-L2. Pre-existing L3-L4 and L4-L5 posterior and interbody hardware. Fluoroscopy time: 22.4 seconds.  18.8 mGy. IMPRESSION: Intraoperative fluoroscopy demonstrating addition of posterior and interbody fusion hardware at L1-L2. Electronically Signed   By: Marlise Simpers M.D.   On: 02/08/2024 13:01   DG Lumbar Spine 2-3 Views Result Date: 02/08/2024 CLINICAL DATA:  69 year old female undergoing lumbar surgery. EXAM: LUMBAR SPINE - 2-3 VIEW COMPARISON:  Lumbar CT myelogram 08/22/2023. FINDINGS: Same numbering system used as on the comparison CT myelogram where normal lumbar segmentation with full size ribs at T12 noted, previous L3-L4 and L4-L5 posterior and interbody fusion. Intraoperative localization Intraoperative portable cross-table lateral view of the lumbar spine at 0957 hours. On this image a retractor projects over the T12-L1 level. L3 through L5 fusion hardware again noted. Intraoperative portable cross-table lateral view of the lumbar spine at 1002 hours. Small surgical probe is being directed toward the L1-L2 disc space. Intraoperative portable cross-table  lateral view of the lumbar spine at 1024 hours. Retractor is in place at the L1-L2 level and is smaller surgical probe projects along the back of the L2 body. IMPRESSION: Intraoperative localization as above. Electronically Signed   By: Marlise Simpers M.D.   On: 02/08/2024 12:59   DG C-Arm 1-60 Min-No Report Result Date: 02/08/2024 Fluoroscopy was utilized by the requesting physician.  No radiographic interpretation.   DG C-Arm 1-60 Min-No Report Result Date: 02/08/2024 Fluoroscopy was utilized by the requesting physician.  No radiographic interpretation.       Assessment & Plan:  History of pulmonary embolus (PE) Assessment & Plan: Recent diagnosis of PE. CT scan as outlined. No sob. Pain improved/resolved. Taking eliquis. Will contact cardiology regarding echo - to confirm no right heart strain.  With right leg larger than left and some calf tenderness, will obtain lower extremity ultrasound to confirm no DVT. Refer to hematology for w/up. Had recent surgery. Has been walking and trying to stay active. Continue eliquis.   Orders: -     US  Venous Img  Lower Bilateral (DVT); Future -     Ambulatory referral to Hematology / Oncology  Essential hypertension -     Potassium Chloride  ER; Take 1 tablet (10 mEq total) by mouth daily.  Dispense: 90 tablet; Refill: 1  Anxiety Assessment & Plan: On cymbalta  and prozac .   Follow closely.  Previously tried buspar  and gabapentin  - did not feel helped.  Has xanax  .5mg  to use sparingly.  Overall appears to be stable.    Aortic atherosclerosis (HCC) Assessment & Plan: Continue praluent .    Chronic midline back pain, unspecified back location Assessment & Plan: S/p recent surgery as outlined. Has f/u with Dr Erman Hayward next week.    Depression, major, single episode, mild (HCC) Assessment & Plan: Continues on prozac  and cymbalta . She is limiting xanax . Follow.    Hypercholesterolemia Assessment & Plan: The 10-year ASCVD risk score (Arnett DK, et al.,  2019) is: 19.6%   Values used to calculate the score:     Age: 62 years     Sex: Female     Is Non-Hispanic African American: No     Diabetic: No     Tobacco smoker: Yes     Systolic Blood Pressure: 132 mmHg     Is BP treated: Yes     HDL Cholesterol: 50.4 mg/dL     Total Cholesterol: 238 mg/dL  Continue praluent .    Primary hypertension Assessment & Plan: Continue metoprolol . On triam/hydrochlorothiazide . Blood as outlined. Follow pressures.  Recheck 122/82. No changes today.    Other orders -     FLUoxetine  HCl; Take 1 capsule (40 mg total) by mouth daily.  Dispense: 90 capsule; Refill: 1     Dellar Fenton, MD

## 2024-04-13 NOTE — Assessment & Plan Note (Signed)
 On cymbalta  and prozac .   Follow closely.  Previously tried buspar  and gabapentin  - did not feel helped.  Has xanax  .5mg  to use sparingly.  Overall appears to be stable.

## 2024-04-13 NOTE — Assessment & Plan Note (Signed)
 The 10-year ASCVD risk score (Arnett DK, et al., 2019) is: 19.6%   Values used to calculate the score:     Age: 68 years     Sex: Female     Is Non-Hispanic African American: No     Diabetic: No     Tobacco smoker: Yes     Systolic Blood Pressure: 132 mmHg     Is BP treated: Yes     HDL Cholesterol: 50.4 mg/dL     Total Cholesterol: 238 mg/dL  Continue praluent .

## 2024-04-13 NOTE — Assessment & Plan Note (Signed)
 Continues on prozac  and cymbalta . She is limiting xanax . Follow.

## 2024-04-13 NOTE — Assessment & Plan Note (Signed)
 Continue metoprolol . On triam/hydrochlorothiazide . Blood as outlined. Follow pressures.  Recheck 122/82. No changes today.

## 2024-04-13 NOTE — Assessment & Plan Note (Signed)
 Continue praluent.

## 2024-04-13 NOTE — Assessment & Plan Note (Signed)
 S/p recent surgery as outlined. Has f/u with Dr Erman Hayward next week.

## 2024-04-16 DIAGNOSIS — M48062 Spinal stenosis, lumbar region with neurogenic claudication: Secondary | ICD-10-CM | POA: Diagnosis not present

## 2024-04-16 DIAGNOSIS — M5416 Radiculopathy, lumbar region: Secondary | ICD-10-CM | POA: Diagnosis not present

## 2024-04-19 ENCOUNTER — Other Ambulatory Visit (HOSPITAL_COMMUNITY): Payer: Self-pay

## 2024-04-24 DIAGNOSIS — M1811 Unilateral primary osteoarthritis of first carpometacarpal joint, right hand: Secondary | ICD-10-CM | POA: Diagnosis not present

## 2024-04-27 ENCOUNTER — Inpatient Hospital Stay

## 2024-04-27 ENCOUNTER — Encounter: Payer: Self-pay | Admitting: Oncology

## 2024-04-27 ENCOUNTER — Inpatient Hospital Stay: Attending: Oncology | Admitting: Oncology

## 2024-04-27 VITALS — BP 133/72 | HR 68 | Temp 97.3°F | Resp 19 | Wt 181.5 lb

## 2024-04-27 DIAGNOSIS — I2699 Other pulmonary embolism without acute cor pulmonale: Secondary | ICD-10-CM | POA: Diagnosis not present

## 2024-04-27 DIAGNOSIS — F17210 Nicotine dependence, cigarettes, uncomplicated: Secondary | ICD-10-CM | POA: Insufficient documentation

## 2024-04-27 DIAGNOSIS — Z7901 Long term (current) use of anticoagulants: Secondary | ICD-10-CM | POA: Insufficient documentation

## 2024-04-27 LAB — ANTITHROMBIN III: AntiThromb III Func: 125 % — ABNORMAL HIGH (ref 75–120)

## 2024-04-28 LAB — LUPUS ANTICOAGULANT
DRVVT: 48.8 s — ABNORMAL HIGH (ref 0.0–47.0)
PTT Lupus Anticoagulant: 31.1 s (ref 0.0–43.5)
Thrombin Time: 19.8 s (ref 0.0–23.0)
dPT Confirm Ratio: 1.01 ratio (ref 0.00–1.34)
dPT: 34.6 s (ref 0.0–47.6)

## 2024-04-28 LAB — HEXAGONAL PHASE PHOSPHOLIPID: Hex Phosph Neut Test: 1 s (ref 0–11)

## 2024-04-28 LAB — PROTEIN S PANEL
Protein S Activity: 125 % (ref 63–140)
Protein S Ag, Free: 121 % (ref 61–136)
Protein S Ag, Total: 133 % (ref 60–150)

## 2024-04-28 LAB — DRVVT CONFIRM: dRVVT Confirm: 0.9 ratio (ref 0.8–1.2)

## 2024-04-28 LAB — DRVVT MIX: dRVVT Mix: 42.5 s — ABNORMAL HIGH (ref 0.0–40.4)

## 2024-04-28 LAB — HEX PHASE PHOSPHOLIPID REFLEX

## 2024-04-29 LAB — CARDIOLIPIN ANTIBODIES, IGG, IGM, IGA
Anticardiolipin IgA: <9 U/mL (ref 0–11)
Anticardiolipin IgG: <9 GPL U/mL (ref 0–14)
Anticardiolipin IgM: <9 [MPL'U]/mL (ref 0–12)

## 2024-04-30 ENCOUNTER — Encounter: Payer: Self-pay | Admitting: Oncology

## 2024-04-30 LAB — PROTEIN C, TOTAL: Protein C, Total: 110 % (ref 60–150)

## 2024-04-30 LAB — BETA-2-GLYCOPROTEIN I ABS, IGG/M/A
Beta-2 Glyco I IgG: <9 GPI IgG units (ref 0–20)
Beta-2-Glycoprotein I IgA: <9 GPI IgA units (ref 0–25)
Beta-2-Glycoprotein I IgM: <9 GPI IgM units (ref 0–32)

## 2024-04-30 NOTE — Progress Notes (Signed)
 Hematology/Oncology Consult note [facility] Telephone:([phone] [phone]  Patient Care Team: Dellar Fenton, MD as PCP - General (Internal Medicine) Devorah Fonder, MD as PCP - Cardiology (Cardiology) Dellar Fenton, MD (Internal Medicine) Marquita Situ Magali Schmitz, MD (General Surgery) Avonne Boettcher, MD as Consulting Physician (Oncology)   Name of the patient: Angel Riddle  [id number]  [birth year]    Reason for referral-history of pulmonary embolism   Referring physician-Dr. Dellar Fenton  Date of visit: 04/30/24   History of presenting illness- Patient is a 68 year old female with a past medical history significant for GERD low back pain and history of depression among other medical problems patient underwent L1-L2 decompression and fusion surgery on 02/08/2024.  2 months after her surgery patient was noted to have increasing right-sided pleuritic chest pain and therefore underwent CT angio chest while she was on vacation in [place] .  CTA showed acute segmental/subsegmental pulmonary emboli involving the right lower lobe and small clot burden.  Subsequently she underwent bilateral lower extremity Doppler which did not show any evidence of DVT.  She is presently on Eliquis.  Patient has never had any history of DVT or PE before.  She is otherwise active for her age.  Denies any changes in her appetite or weight  ECOG PS- 0  Pain scale- 0   Review of systems- Review of Systems  Constitutional:  Negative for chills, fever, malaise/fatigue and weight loss.  HENT:  Negative for congestion, ear discharge and nosebleeds.   Eyes:  Negative for blurred vision.  Respiratory:  Negative for cough, hemoptysis, sputum production, shortness of breath and wheezing.   Cardiovascular:  Negative for chest pain, palpitations, orthopnea and claudication.  Gastrointestinal:  Negative for abdominal pain, blood in stool, constipation, diarrhea, heartburn,  melena, nausea and vomiting.  Genitourinary:  Negative for dysuria, flank pain, frequency, hematuria and urgency.  Musculoskeletal:  Negative for back pain, joint pain and myalgias.  Skin:  Negative for rash.  Neurological:  Negative for dizziness, tingling, focal weakness, seizures, weakness and headaches.  Endo/Heme/Allergies:  Does not bruise/bleed easily.  Psychiatric/Behavioral:  Negative for depression and suicidal ideas. The patient does not have insomnia.     Allergies  Allergen Reactions   Doxycycline Nausea And Vomiting   Gadolinium Nausea And Vomiting   Gadolinium Derivatives Nausea And Vomiting   Iodinated Contrast Media Nausea And Vomiting and Other (See Comments)     Eyes turned blood red  During a CT scan of the kidneys    Levofloxacin      Muscle Pain   Silicone     "Pulls skin"; anything that does not pull is recommended   Adhesive [Tape] Other (See Comments)    Pulls skin, anything that does not pull is recommended     Patient Active Problem List   Diagnosis Date Noted   History of pulmonary embolus (PE) 04/13/2024   Spondylolisthesis of lumbar region 02/08/2024   URI (upper respiratory infection) 12/11/2023   Hematuria 11/01/2023   Lumbar foraminal stenosis 07/29/2023   Change in bowel movement 06/19/2023   Low back pain 05/08/2023   Knee pain 04/06/2023   Thumb pain 04/05/2023   Muscle ache 03/29/2023   Acute non-recurrent sinusitis 12/31/2022   B12 deficiency 09/26/2022   Tachycardia 04/06/2022   Dysuria 01/18/2022   Leg pain 05/13/2021   Double vision 03/14/2021   Female bladder prolapse 03/14/2021   Tinnitus 07/05/2020   Chronic left sacroiliac pain 05/21/2020   Chronic pain of left knee  05/21/2020   Bilateral hip pain 05/21/2020   Left elbow pain 05/21/2020   Fatigue 12/30/2019   Weight loss counseling, encounter for 12/30/2019   Breast cancer screening 07/07/2019   Aortic atherosclerosis (HCC) 02/10/2019   GERD (gastroesophageal reflux  disease) 06/20/2018   Chest pain 01/06/2018   Lumbar stenosis with neurogenic claudication 09/15/2017   BMI 31.0-31.9,adult 04/25/2017   Hyperglycemia 04/25/2017   Personal history of tobacco use, presenting hazards to health 12/15/2016   Nausea without vomiting 03/29/2016   History of colonic polyps    Anal fissure    Benign neoplasm of sigmoid colon    Left arm pain 08/03/2015   Cough 07/30/2015   Health care maintenance 01/12/2015   Abdominal fullness 03/10/2014   Numbness and tingling 10/02/2013   Hypertension 10/02/2013   Chronic back pain 07/18/2013   Depression, major, single episode, mild (HCC) 07/18/2013   Anxiety 07/18/2013   Panic attacks 07/18/2013   Environmental allergies 07/18/2013   Hypercholesterolemia 07/18/2013   History of colonic polyps 07/18/2013     Past Medical History:  Diagnosis Date   Allergy    Anal fissure    Anxiety    a.) on BZO (alprazolam ) PRN   Aortic atherosclerosis (HCC)    Arthritis    B12 deficiency    Basal cell carcinoma (BCC) of skin of nose    Chronic back pain    Degenerative disc disease, lumbar    Depression    DOE (dyspnea on exertion)    Dysrhythmia    Sinus Tachycardia - on Metoprolol    Female bladder prolapse    Gallstones    GERD (gastroesophageal reflux disease)    Headache    Hx of migraines, but none in a long time (as of 2025)   Heart murmur    Hx of colonic polyp    Hx: UTI (urinary tract infection)    Hypercholesterolemia    Hypertension    Long term current use of aspirin     Lumbar stenosis    Panic attacks    Wears partial dentures      Past Surgical History:  Procedure Laterality Date   ABDOMINAL HYSTERECTOMY  1992   partial   CARPOMETACARPAL (CMC) FUSION OF THUMB Left 05/25/2023   Procedure: SUSPENSION ARTHROPLASTY OF LEFT THUMB [facility] JOINT;  Surgeon: Elner Hahn, MD;  Location: [facility];  Service: Orthopedics;  Laterality: Left;   CATARACT EXTRACTION Bilateral [id number]   CHOLECYSTECTOMY   04/19/2014   COLONOSCOPY  07/02/2011   DR. Byrnett   COLONOSCOPY WITH PROPOFOL  N/A 09/25/2015   Procedure: COLONOSCOPY WITH PROPOFOL ;  Surgeon: Marnee Sink, MD;  Location: [facility];  Service: Endoscopy;  Laterality: N/A;   FOOT SURGERY Right 2009   LUMBAR FUSION  2018   MOUTH SURGERY     dental implants, veneers   POLYPECTOMY  09/25/2015   Procedure: POLYPECTOMY;  Surgeon: Marnee Sink, MD;  Location: [facility];  Service: Endoscopy;;   skin surgery Left 08/27/2013   Basal cell removal-left nostril   TONSILLECTOMY AND ADENOIDECTOMY  1969    Social History   Socioeconomic History   Marital status: Widowed    Spouse name: Not on file   Number of children: 2   Years of education: Not on file   Highest education level: Some college, no degree  Occupational History   Not on file  Tobacco Use   Smoking status: Every Day    Current packs/day: 1.00    Average packs/day: 1 pack/day for  47.6 years (47.6 ttl pk-yrs)    Types: Cigarettes    Start date: 2013    Last attempt to quit: 1975   Smokeless tobacco: Never   Tobacco comments:    Smokes 1 PPD  Vaping Use   Vaping status: Never Used  Substance and Sexual Activity   Alcohol use: Yes    Comment: Rarely   Drug use: No    Comment: Delta 9 Gummies - in the evening   Sexual activity: Not Currently    Birth control/protection: None  Other Topics Concern   Not on file  Social History Narrative   Lives alone   Social Drivers of Health   Financial Resource Strain: Low Risk  (04/24/2024)   [facility]   Overall Financial Resource Strain (CARDIA)    Difficulty of Paying Living Expenses: Not very hard  Food Insecurity: No Food Insecurity (04/27/2024)   Hunger Vital Sign    Worried About Running Out of Food in the Last Year: Never true    Ran Out of Food in the Last Year: Never true  Transportation Needs: No Transportation Needs (04/27/2024)   PRAPARE - Scientist, research (physical sciences) (Medical): No    Lack of Transportation (Non-Medical): No  Physical Activity: Unknown (06/23/2023)   Exercise Vital Sign    Days of Exercise per Week: 0 days    Minutes of Exercise per Session: Not on file  Stress: No Stress Concern Present (06/23/2023)   [employer] of Occupational Health - Occupational Stress Questionnaire    Feeling of Stress : Only a little  Social Connections: Unknown (06/23/2023)   Social Connection and Isolation Panel [NHANES]    Frequency of Communication with Friends and Family: More than three times a week    Frequency of Social Gatherings with Friends and Family: Three times a week    Attends Religious Services: More than 4 times per year    Active Member of Clubs or Organizations: Not on file    Attends Banker Meetings: Not on file    Marital Status: Widowed  Intimate Partner Violence: Not At Risk (04/27/2024)   Humiliation, Afraid, Rape, and Kick questionnaire    Fear of Current or Ex-Partner: No    Emotionally Abused: No    Physically Abused: No    Sexually Abused: No     Family History  Problem Relation Age of Onset   Hyperlipidemia Mother    Hypertension Mother    Alcohol abuse Father    Hyperlipidemia Father    Heart disease Father    Diabetes Father    Hyperlipidemia Sister    Lung cancer Brother    Hyperlipidemia Brother    Breast cancer Neg Hx      Current Outpatient Medications:    albuterol  (VENTOLIN  HFA) 108 (90 Base) MCG/ACT inhaler, Inhale 2 puffs into the lungs every 6 (six) hours as needed for wheezing or shortness of breath., Disp: 18 g, Rfl: 0   ALPRAZolam  (XANAX ) 0.5 MG tablet, Take 1 tablet (0.5 mg total) by mouth daily as needed for anxiety., Disp: 30 tablet, Rfl: 0   apixaban (ELIQUIS) 5 MG TABS tablet, Take 5 mg by mouth 2 (two) times daily., Disp: , Rfl:    FLUoxetine  (PROZAC ) 40 MG capsule, Take 1 capsule (40 mg total) by mouth daily., Disp: 90 capsule, Rfl: 1   HYDROcodone -acetaminophen   (NORCO) 7.5-325 MG tablet, Take by mouth., Disp: , Rfl:    methocarbamol  (ROBAXIN ) 500  MG tablet, Take 1 tablet (500 mg total) by mouth every 8 (eight) hours as needed., Disp: 60 tablet, Rfl: 0   metoprolol  succinate (TOPROL -XL) 25 MG 24 hr tablet, Take 1 tablet (25 mg total) by mouth daily. Take with or immediately following a meal., Disp: 90 tablet, Rfl: 3   oxyCODONE -acetaminophen  (PERCOCET) 7.5-325 MG tablet, Take 1 tablet by mouth., Disp: , Rfl:    pantoprazole  (PROTONIX ) 40 MG tablet, Take 1 tablet (40 mg total) by mouth daily., Disp: 90 tablet, Rfl: 3   potassium chloride  (KLOR-CON ) 10 MEQ tablet, Take 1 tablet (10 mEq total) by mouth daily., Disp: 90 tablet, Rfl: 1   pregabalin  (LYRICA ) 75 MG capsule, Take 150 mg by mouth daily., Disp: , Rfl:    triamterene -hydrochlorothiazide  (MAXZIDE -25) 37.5-25 MG tablet, 1/2 tablet per day, Disp: 30 tablet, Rfl: 2   Alirocumab  (PRALUENT ) 75 MG/ML SOAJ, Inject 1 pen (75 mg) into the skin every 14 days., Disp: 6 mL, Rfl: 3   oxyCODONE -acetaminophen  (PERCOCET/ROXICET) 5-325 MG tablet, Take 1-2 tablets by mouth every 4 (four) hours as needed for moderate pain (pain score 4-6). (Patient not taking: Reported on 04/27/2024), Disp: 30 tablet, Rfl: 0   Physical exam:  Vitals:   04/27/24 1310  BP: 133/72  Pulse: 68  Resp: 19  Temp: (!) 97.3 F (36.3 C)  SpO2: 100%  Weight: 181 lb 8 oz (82.3 kg)   Physical Exam Cardiovascular:     Rate and Rhythm: Normal rate and regular rhythm.     Heart sounds: Normal heart sounds.  Pulmonary:     Effort: Pulmonary effort is normal.     Breath sounds: Normal breath sounds.  Abdominal:     General: Bowel sounds are normal.     Palpations: Abdomen is soft.  Skin:    General: Skin is warm and dry.  Neurological:     Mental Status: She is alert and oriented to person, place, and time.           Latest Ref Rng & Units 02/07/2024    1:49 PM  CMP  Glucose 70 - 99 mg/dL 478   BUN 6 - 23 mg/dL 18   Creatinine  2.95 - 1.20 mg/dL 6.21   Sodium 308 - 657 mEq/L 136   Potassium 3.5 - 5.1 mEq/L 3.9   Chloride 96 - 112 mEq/L 100   CO2 19 - 32 mEq/L 27   Calcium 8.4 - 10.5 mg/dL 9.4       Latest Ref Rng & Units 02/03/2024    1:35 PM  CBC  WBC 4.0 - 10.5 K/uL 12.0   Hemoglobin 12.0 - 15.0 g/dL 84.6   Hematocrit 96.2 - 46.0 % 40.9   Platelets 150 - 400 K/uL 323     No images are attached to the encounter.  US  Venous Img Lower Bilateral Result Date: 04/13/2024 CLINICAL DATA:  History of PE, right leg swelling EXAM: BILATERAL LOWER EXTREMITY VENOUS DOPPLER ULTRASOUND TECHNIQUE: Gray-scale sonography with compression, as well as color and duplex ultrasound, were performed to evaluate the deep venous system(s) from the level of the common femoral vein through the popliteal and proximal calf veins. COMPARISON:  None Available. FINDINGS: VENOUS Normal compressibility of the common femoral, superficial femoral, and popliteal veins, as well as the visualized calf veins. Visualized portions of profunda femoral vein and great saphenous vein unremarkable. No filling defects to suggest DVT on grayscale or color Doppler imaging. Doppler waveforms show normal direction of venous flow, normal respiratory plasticity and  response to augmentation. Limited views of the contralateral common femoral vein are unremarkable. OTHER None. Limitations: none IMPRESSION: Negative. Electronically Signed   By: Reagan Camera M.D.   On: 04/13/2024 13:16    Assessment and plan- Patient is a 68 y.o. female for history of pulmonary embolism:  Pulmonary embolism seems unprovoked given that her lumbar spine fusion surgery was 2 months prior to her episode of subsegmental PE.  I would recommend that she should continue Eliquis for 6 months and following that we will observe her off anticoagulation given the small clot burden as well as no prior history of DVT or PE.  I am proceeding with hypercoaguable Workup today.  I will see her back in 3 weeks  to discuss the results of blood work   Thank you for this kind referral and the opportunity to participate in the care of this  Patient   Visit Diagnosis 1. Other acute pulmonary embolism without acute cor pulmonale (HCC)     Dr. Seretha Dance, MD, MPH [facility] [phone] 04/30/2024

## 2024-05-01 ENCOUNTER — Other Ambulatory Visit: Payer: TRICARE For Life (TFL)

## 2024-05-02 LAB — FACTOR 5 LEIDEN

## 2024-05-03 ENCOUNTER — Other Ambulatory Visit (INDEPENDENT_AMBULATORY_CARE_PROVIDER_SITE_OTHER)

## 2024-05-03 DIAGNOSIS — I10 Essential (primary) hypertension: Secondary | ICD-10-CM | POA: Diagnosis not present

## 2024-05-03 LAB — BASIC METABOLIC PANEL WITH GFR
BUN: 16 mg/dL (ref 6–23)
CO2: 25 meq/L (ref 19–32)
Calcium: 9.8 mg/dL (ref 8.4–10.5)
Chloride: 102 meq/L (ref 96–112)
Creatinine, Ser: 1 mg/dL (ref 0.40–1.20)
GFR: 58 mL/min — ABNORMAL LOW (ref >60.00)
Glucose, Bld: 113 mg/dL — ABNORMAL HIGH (ref 70–99)
Potassium: 3.6 meq/L (ref 3.5–5.1)
Sodium: 139 meq/L (ref 135–145)

## 2024-05-04 ENCOUNTER — Other Ambulatory Visit: Payer: Self-pay | Admitting: Internal Medicine

## 2024-05-04 ENCOUNTER — Ambulatory Visit

## 2024-05-04 ENCOUNTER — Ambulatory Visit: Payer: Self-pay | Admitting: Internal Medicine

## 2024-05-04 DIAGNOSIS — E7800 Pure hypercholesterolemia, unspecified: Secondary | ICD-10-CM

## 2024-05-04 LAB — LIPID PANEL
Cholesterol: 238 mg/dL — ABNORMAL HIGH (ref 0–200)
HDL: 47.6 mg/dL (ref >39.00)
NonHDL: 190
Total CHOL/HDL Ratio: 5
Triglycerides: 418 mg/dL — ABNORMAL HIGH (ref 0.0–149.0)
VLDL: 83.6 mg/dL — ABNORMAL HIGH (ref 0.0–40.0)

## 2024-05-04 LAB — HEPATIC FUNCTION PANEL
ALT: 7 U/L (ref 0–35)
AST: 16 U/L (ref 0–37)
Albumin: 4.5 g/dL (ref 3.5–5.2)
Alkaline Phosphatase: 118 U/L — ABNORMAL HIGH (ref 39–117)
Bilirubin, Direct: 0.1 mg/dL (ref 0.0–0.3)
Total Bilirubin: 0.6 mg/dL (ref 0.2–1.2)
Total Protein: 7.2 g/dL (ref 6.0–8.3)

## 2024-05-04 LAB — LDL CHOLESTEROL, DIRECT: Direct LDL: 139 mg/dL

## 2024-05-04 NOTE — Progress Notes (Signed)
Order placed for add on lab.  °

## 2024-05-06 ENCOUNTER — Ambulatory Visit: Payer: Self-pay | Admitting: Internal Medicine

## 2024-05-07 ENCOUNTER — Ambulatory Visit (INDEPENDENT_AMBULATORY_CARE_PROVIDER_SITE_OTHER): Payer: Medicare Other | Admitting: Internal Medicine

## 2024-05-07 VITALS — BP 116/70 | HR 86 | Temp 97.9°F | Resp 16 | Ht 65.0 in | Wt 183.0 lb

## 2024-05-07 DIAGNOSIS — G8929 Other chronic pain: Secondary | ICD-10-CM

## 2024-05-07 DIAGNOSIS — R739 Hyperglycemia, unspecified: Secondary | ICD-10-CM | POA: Diagnosis not present

## 2024-05-07 DIAGNOSIS — E7800 Pure hypercholesterolemia, unspecified: Secondary | ICD-10-CM

## 2024-05-07 DIAGNOSIS — R5383 Other fatigue: Secondary | ICD-10-CM

## 2024-05-07 DIAGNOSIS — F320 Major depressive disorder, single episode, mild: Secondary | ICD-10-CM

## 2024-05-07 DIAGNOSIS — F419 Anxiety disorder, unspecified: Secondary | ICD-10-CM | POA: Diagnosis not present

## 2024-05-07 DIAGNOSIS — R109 Unspecified abdominal pain: Secondary | ICD-10-CM | POA: Insufficient documentation

## 2024-05-07 DIAGNOSIS — Z1231 Encounter for screening mammogram for malignant neoplasm of breast: Secondary | ICD-10-CM

## 2024-05-07 DIAGNOSIS — Z86711 Personal history of pulmonary embolism: Secondary | ICD-10-CM

## 2024-05-07 DIAGNOSIS — I700 Atherosclerosis of aorta: Secondary | ICD-10-CM | POA: Diagnosis not present

## 2024-05-07 DIAGNOSIS — R1013 Epigastric pain: Secondary | ICD-10-CM

## 2024-05-07 DIAGNOSIS — M549 Dorsalgia, unspecified: Secondary | ICD-10-CM

## 2024-05-07 DIAGNOSIS — I10 Essential (primary) hypertension: Secondary | ICD-10-CM

## 2024-05-07 DIAGNOSIS — K219 Gastro-esophageal reflux disease without esophagitis: Secondary | ICD-10-CM | POA: Diagnosis not present

## 2024-05-07 DIAGNOSIS — Z0000 Encounter for general adult medical examination without abnormal findings: Secondary | ICD-10-CM

## 2024-05-07 NOTE — Patient Instructions (Signed)
 Take pepcid (famotidine) 20mg  - 30 minutes before evening meal

## 2024-05-07 NOTE — Progress Notes (Signed)
 Subjective:    Patient ID: Angel Riddle, female    DOB: 15-Sep-1956, 68 y.o.   MRN: [id number]  Patient here for  Chief Complaint  Patient presents with   Annual Exam         HPI Here for a physical exam. S/p L1-L2 decompression and fusion surgery 3//5/35. Two months after the surgery noticed right sided pleuritic chest pain. CTA - PE. Recommended eliquis  for 6 months. Had f/u with ortho 04/24/24 - right thumb CMC OA - injection. Saw Dr Erman Hayward 04/16/24 - continue tylenol , norco and lyrica . ESI - L2-3. Continues on cymbalta  and prozac . Reports GI issues recently. Bowel change - color change - black, green and yellow. Has had some increased acid reflux and nausea. Has taken pepto bismol. Also, 5/5 - noticed small bm's (pellets) - 3-5x/day. One stool yesterday. No vomiting. Some increased gas and belching.    Past Medical History:  Diagnosis Date   Allergy    Anal fissure    Anxiety    a.) on BZO (alprazolam ) PRN   Aortic atherosclerosis (HCC)    Arthritis    B12 deficiency    Basal cell carcinoma (BCC) of skin of nose    Chronic back pain    Degenerative disc disease, lumbar    Depression    DOE (dyspnea on exertion)    Dysrhythmia    Sinus Tachycardia - on Metoprolol    Female bladder prolapse    Gallstones    GERD (gastroesophageal reflux disease)    Headache    Hx of migraines, but none in a long time (as of 2025)   Heart murmur    Hx of colonic polyp    Hx: UTI (urinary tract infection)    Hypercholesterolemia    Hypertension    Long term current use of aspirin     Lumbar stenosis    Panic attacks    Wears partial dentures    Past Surgical History:  Procedure Laterality Date   ABDOMINAL HYSTERECTOMY  1992   partial   CARPOMETACARPAL (CMC) FUSION OF THUMB Left 05/25/2023   Procedure: SUSPENSION ARTHROPLASTY OF LEFT THUMB CMC JOINT;  Surgeon: Elner Hahn, MD;  Location: [facility];  Service: Orthopedics;  Laterality: Left;   CATARACT EXTRACTION Bilateral 7829,[id number]    CHOLECYSTECTOMY  04/19/2014   COLONOSCOPY  07/02/2011   DR. Byrnett   COLONOSCOPY WITH PROPOFOL  N/A 09/25/2015   Procedure: COLONOSCOPY WITH PROPOFOL ;  Surgeon: Marnee Sink, MD;  Location: [facility];  Service: Endoscopy;  Laterality: N/A;   FOOT SURGERY Right 2009   LUMBAR FUSION  2018   MOUTH SURGERY     dental implants, veneers   POLYPECTOMY  09/25/2015   Procedure: POLYPECTOMY;  Surgeon: Marnee Sink, MD;  Location: [facility];  Service: Endoscopy;;   skin surgery Left 08/27/2013   Basal cell removal-left nostril   TONSILLECTOMY AND ADENOIDECTOMY  1969   Family History  Problem Relation Age of Onset   Hyperlipidemia Mother    Hypertension Mother    Alcohol abuse Father    Hyperlipidemia Father    Heart disease Father    Diabetes Father    Hyperlipidemia Sister    Lung cancer Brother    Hyperlipidemia Brother    Breast cancer Neg Hx    Social History   Socioeconomic History   Marital status: Widowed    Spouse name: Not on file   Number of children: 2   Years of education: Not on file   Highest  education level: Some college, no degree  Occupational History   Not on file  Tobacco Use   Smoking status: Every Day    Current packs/day: 1.00    Average packs/day: 1 pack/day for 47.6 years (47.6 ttl pk-yrs)    Types: Cigarettes    Start date: 2013    Last attempt to quit: 1975   Smokeless tobacco: Never   Tobacco comments:    Smokes 1 PPD  Vaping Use   Vaping status: Never Used  Substance and Sexual Activity   Alcohol use: Yes    Comment: Rarely   Drug use: No    Comment: Delta 9 Gummies - in the evening   Sexual activity: Not Currently    Birth control/protection: None  Other Topics Concern   Not on file  Social History Narrative   Lives alone   Social Drivers of Health   Financial Resource Strain: Low Risk  (04/24/2024)   Received [facility]   Overall Financial Resource Strain (CARDIA)    Difficulty of Paying  Living Expenses: Not very hard  Food Insecurity: No Food Insecurity (04/27/2024)   Hunger Vital Sign    Worried About Running Out of Food in the Last Year: Never true    Ran Out of Food in the Last Year: Never true  Transportation Needs: No Transportation Needs (04/27/2024)   PRAPARE - Administrator, Civil Service (Medical): No    Lack of Transportation (Non-Medical): No  Physical Activity: Unknown (06/23/2023)   Exercise Vital Sign    Days of Exercise per Week: 0 days    Minutes of Exercise per Session: Not on file  Stress: No Stress Concern Present (06/23/2023)   [employer] of Occupational Health - Occupational Stress Questionnaire    Feeling of Stress : Only a little  Social Connections: Unknown (06/23/2023)   Social Connection and Isolation Panel [NHANES]    Frequency of Communication with Friends and Family: More than three times a week    Frequency of Social Gatherings with Friends and Family: Three times a week    Attends Religious Services: More than 4 times per year    Active Member of Clubs or Organizations: Not on file    Attends Banker Meetings: Not on file    Marital Status: Widowed     Review of Systems  Constitutional:  Negative for appetite change, fever and unexpected weight change.  HENT:  Negative for congestion, sinus pressure and sore throat.        Some drainage.   Eyes:  Negative for pain and visual disturbance.  Respiratory:  Negative for cough, chest tightness and shortness of breath.   Cardiovascular:  Negative for chest pain, palpitations and leg swelling.  Gastrointestinal:  Positive for nausea. Negative for abdominal pain and vomiting.       Bowel change as outlined.   Genitourinary:  Negative for difficulty urinating and dysuria.  Musculoskeletal:  Negative for joint swelling and myalgias.  Skin:  Negative for color change and rash.  Neurological:  Negative for dizziness and headaches.  Hematological:  Negative for  adenopathy. Does not bruise/bleed easily.  Psychiatric/Behavioral:  Negative for agitation and dysphoric mood.        Objective:     BP 116/70   Pulse 86   Temp 97.9 F (36.6 C)   Resp 16   Ht 5\' 5"  (1.651 m)   Wt 183 lb (83 kg)   SpO2 98%   BMI  30.45 kg/m  Wt Readings from Last 3 Encounters:  05/07/24 183 lb (83 kg)  04/27/24 181 lb 8 oz (82.3 kg)  04/13/24 181 lb 9.6 oz (82.4 kg)    Physical Exam Constitutional:      General: She is not in acute distress.    Appearance: Normal appearance.  HENT:     Head: Normocephalic and atraumatic.     Nose: Nose normal.     Mouth/Throat:     Pharynx: No oropharyngeal exudate or posterior oropharyngeal erythema.  Neck:     Thyroid : No thyromegaly.  Cardiovascular:     Rate and Rhythm: Normal rate and regular rhythm.  Pulmonary:     Effort: No respiratory distress.     Breath sounds: Normal breath sounds. No wheezing.  Abdominal:     General: Bowel sounds are normal.     Palpations: Abdomen is soft.     Tenderness: There is no abdominal tenderness.  Genitourinary:    Comments: Wanted to hold on rectal exam.  Musculoskeletal:        General: No swelling or tenderness.     Cervical back: Neck supple. No tenderness.  Lymphadenopathy:     Cervical: No cervical adenopathy.  Skin:    Findings: No erythema or rash.  Neurological:     Mental Status: She is alert.  Psychiatric:        Mood and Affect: Mood normal.        Behavior: Behavior normal.         Outpatient Encounter Medications as of 05/07/2024  Medication Sig   albuterol  (VENTOLIN  HFA) 108 (90 Base) MCG/ACT inhaler Inhale 2 puffs into the lungs every 6 (six) hours as needed for wheezing or shortness of breath.   Alirocumab  (PRALUENT ) 75 MG/ML SOAJ Inject 1 pen (75 mg) into the skin every 14 days.   ALPRAZolam  (XANAX ) 0.5 MG tablet Take 1 tablet (0.5 mg total) by mouth daily as needed for anxiety.   FLUoxetine  (PROZAC ) 40 MG capsule Take 1 capsule (40 mg total)  by mouth daily.   HYDROcodone -acetaminophen  (NORCO) 7.5-325 MG tablet Take by mouth.   methocarbamol  (ROBAXIN ) 500 MG tablet Take 1 tablet (500 mg total) by mouth every 8 (eight) hours as needed.   metoprolol  succinate (TOPROL -XL) 25 MG 24 hr tablet Take 1 tablet (25 mg total) by mouth daily. Take with or immediately following a meal.   oxyCODONE -acetaminophen  (PERCOCET) 7.5-325 MG tablet Take 1 tablet by mouth.   pantoprazole  (PROTONIX ) 40 MG tablet Take 1 tablet (40 mg total) by mouth daily.   potassium chloride  (KLOR-CON ) 10 MEQ tablet Take 1 tablet (10 mEq total) by mouth daily.   pregabalin  (LYRICA ) 75 MG capsule Take 150 mg by mouth daily.   triamterene -hydrochlorothiazide  (MAXZIDE -25) 37.5-25 MG tablet 1/2 tablet per day   [DISCONTINUED] apixaban  (ELIQUIS ) 5 MG TABS tablet Take 5 mg by mouth 2 (two) times daily.   [DISCONTINUED] oxyCODONE -acetaminophen  (PERCOCET/ROXICET) 5-325 MG tablet Take 1-2 tablets by mouth every 4 (four) hours as needed for moderate pain (pain score 4-6). (Patient not taking: Reported on 04/27/2024)   No facility-administered encounter medications on file as of 05/07/2024.     Lab Results  Component Value Date   WBC 10.7 (H) 05/07/2024   HGB 13.0 05/07/2024   HCT 37.3 05/07/2024   PLT 362.0 05/07/2024   GLUCOSE 113 (H) 05/03/2024   CHOL 238 (H) 05/04/2024   TRIG (H) 05/04/2024    418.0 Triglyceride is over 400; calculations on Lipids are invalid.  HDL 47.60 05/04/2024   LDLDIRECT 139.0 05/04/2024   LDLCALC 119 (H) 11/15/2023   ALT 8 05/07/2024   AST 15 05/07/2024   NA 139 05/03/2024   K 3.6 05/03/2024   CL 102 05/03/2024   CREATININE 1.00 05/03/2024   BUN 16 05/03/2024   CO2 25 05/03/2024   TSH 0.72 11/15/2023   HGBA1C 6.1 05/07/2024    US  Venous Img Lower Bilateral Result Date: 04/13/2024 CLINICAL DATA:  History of PE, right leg swelling EXAM: BILATERAL LOWER EXTREMITY VENOUS DOPPLER ULTRASOUND TECHNIQUE: Gray-scale sonography with compression, as  well as color and duplex ultrasound, were performed to evaluate the deep venous system(s) from the level of the common femoral vein through the popliteal and proximal calf veins. COMPARISON:  None Available. FINDINGS: VENOUS Normal compressibility of the common femoral, superficial femoral, and popliteal veins, as well as the visualized calf veins. Visualized portions of profunda femoral vein and great saphenous vein unremarkable. No filling defects to suggest DVT on grayscale or color Doppler imaging. Doppler waveforms show normal direction of venous flow, normal respiratory plasticity and response to augmentation. Limited views of the contralateral common femoral vein are unremarkable. OTHER None. Limitations: none IMPRESSION: Negative. Electronically Signed   By: Reagan Camera M.D.   On: 04/13/2024 13:16       Assessment & Plan:  Routine general medical examination at a health care facility  Health care maintenance Assessment & Plan: Physical today 05/07/24.  Mammogram 04/25/23 - Birads I. Scheduled for f/u mammogram.  Colonnoscopy 09/2015.  Recommend f/u in 5 years.  Saw Dr Marquita Situ.  Recommended cologuard. Recommended holding colonoscopy until 2025-2026. Given above changes, need to refer back to GI for further evaluation.    Hypercholesterolemia Assessment & Plan: The 10-year ASCVD risk score (Arnett DK, et al., 2019) is: 20.2%   Values used to calculate the score:     Age: 70 years     Sex: Female     Is Non-Hispanic African American: No     Diabetic: No     Tobacco smoker: Yes     Systolic Blood Pressure: 133 mmHg     Is BP treated: Yes     HDL Cholesterol: 47.6 mg/dL     Total Cholesterol: 238 mg/dL  Continue praluent .   Orders: -     Lipid panel; Future -     Basic metabolic panel with GFR; Future -     Hepatic function panel; Future  Visit for screening mammogram -     3D Screening Mammogram, Left and Right; Future  Primary hypertension Assessment & Plan: Continue  metoprolol . On triam/hydrochlorothiazide . Blood as outlined. Follow pressures.  No changes in medication today.    Hyperglycemia Assessment & Plan: Low carb diet and exercise.  Follow met b and A1c.  Lab Results  Component Value Date   HGBA1C 6.1 05/07/2024    Orders: -     Hemoglobin A1c; Future -     Hemoglobin A1c  Epigastric pain Assessment & Plan: Reports bowel change, increased acid reflux and some nausea as outlined. Bowels appear to be doing some better. Continues on protonix  daily. Add pepcid - taking 30 minutes before evening meal. After reviewing and given change, recommend GI evaluation - further w/up - colonoscopy, etc. Check labs, including cbc, liver panel and lipase.   Orders: -     CBC with Differential/Platelet -     Hepatic function panel -     Lipase  Other fatigue Assessment & Plan: Check labs  as outlined. Treat GI symptoms.   Orders: -     Vitamin B12  Aortic atherosclerosis (HCC) Assessment & Plan: Continue praluent .    Anxiety Assessment & Plan: Continues on cymbalta  and prozac . Has xanax  - uses sparingly. Appears to be stable.    Chronic midline back pain, unspecified back location Assessment & Plan: Continue f/u with Dr Erman Hayward as outlined.    Depression, major, single episode, mild (HCC) Assessment & Plan: Continues on prozac  and cymbalta . She is limiting xanax . Stable.    Gastroesophageal reflux disease, unspecified whether esophagitis present Assessment & Plan: Continues on protonix . Add pepcied 30 minutes before evening meal. Follow.    History of pulmonary embolus (PE) Assessment & Plan: Recent diagnosis of PE. CT scan as outlined. No sob. Pain improved/resolved. Taking eliquis . Will contact cardiology regarding echo - to confirm no right heart strain. Continue eliquis . Has appt with Dr Randy Buttery.       Dellar Fenton, MD

## 2024-05-07 NOTE — Assessment & Plan Note (Addendum)
 Physical today 05/07/24.  Mammogram 04/25/23 - Birads I. Scheduled for f/u mammogram.  Colonnoscopy 09/2015.  Recommend f/u in 5 years.  Saw Dr Marquita Situ.  Recommended cologuard. Recommended holding colonoscopy until 2025-2026. Given above changes, need to refer back to GI for further evaluation.

## 2024-05-07 NOTE — Assessment & Plan Note (Signed)
 The 10-year ASCVD risk score (Arnett DK, et al., 2019) is: 20.2%   Values used to calculate the score:     Age: 68 years     Sex: Female     Is Non-Hispanic African American: No     Diabetic: No     Tobacco smoker: Yes     Systolic Blood Pressure: 133 mmHg     Is BP treated: Yes     HDL Cholesterol: 47.6 mg/dL     Total Cholesterol: 238 mg/dL  Continue praluent .

## 2024-05-08 ENCOUNTER — Telehealth: Payer: Self-pay

## 2024-05-08 ENCOUNTER — Other Ambulatory Visit: Payer: Self-pay | Admitting: Internal Medicine

## 2024-05-08 DIAGNOSIS — Z86711 Personal history of pulmonary embolism: Secondary | ICD-10-CM

## 2024-05-08 LAB — CBC WITH DIFFERENTIAL/PLATELET
Basophils Absolute: 0.1 10*3/uL (ref 0.0–0.1)
Basophils Relative: 1.2 % (ref 0.0–3.0)
Eosinophils Absolute: 0.1 10*3/uL (ref 0.0–0.7)
Eosinophils Relative: 1.1 % (ref 0.0–5.0)
HCT: 37.3 % (ref 36.0–46.0)
Hemoglobin: 13 g/dL (ref 12.0–15.0)
Lymphocytes Relative: 32.6 % (ref 12.0–46.0)
Lymphs Abs: 3.5 10*3/uL (ref 0.7–4.0)
MCHC: 34.8 g/dL (ref 30.0–36.0)
MCV: 85.7 fl (ref 78.0–100.0)
Monocytes Absolute: 0.7 10*3/uL (ref 0.1–1.0)
Monocytes Relative: 6.3 % (ref 3.0–12.0)
Neutro Abs: 6.3 10*3/uL (ref 1.4–7.7)
Neutrophils Relative %: 58.8 % (ref 43.0–77.0)
Platelets: 362 10*3/uL (ref 150.0–400.0)
RBC: 4.35 Mil/uL (ref 3.87–5.11)
RDW: 14 % (ref 11.5–15.5)
WBC: 10.7 10*3/uL — ABNORMAL HIGH (ref 4.0–10.5)

## 2024-05-08 LAB — LIPASE: Lipase: 55 U/L (ref 11.0–59.0)

## 2024-05-08 LAB — VITAMIN B12: Vitamin B-12: 121 pg/mL — ABNORMAL LOW (ref 211–911)

## 2024-05-08 LAB — HEPATIC FUNCTION PANEL
ALT: 8 U/L (ref 0–35)
AST: 15 U/L (ref 0–37)
Albumin: 4.1 g/dL (ref 3.5–5.2)
Alkaline Phosphatase: 102 U/L (ref 39–117)
Bilirubin, Direct: 0 mg/dL (ref 0.0–0.3)
Total Bilirubin: 0.4 mg/dL (ref 0.2–1.2)
Total Protein: 6.8 g/dL (ref 6.0–8.3)

## 2024-05-08 LAB — HEMOGLOBIN A1C: Hgb A1c MFr Bld: 6.1 % (ref 4.6–6.5)

## 2024-05-08 NOTE — Telephone Encounter (Signed)
 Copied from CRM [id number]. Topic: Clinical - Medication Question >> May 08, 2024  2:24 PM [name] wrote: Reason for CRM: Patient wondering what Dr. Thayne Fine instructions were for pepcid, whether it was over the counter or prescription + how to take it. Patient would also like to know about any coupons for the eliquis.  Called Patient to let her know that Pepcid is otc and I do not see where Dr. Geralyn Knee ordered it for her so if she needs it tonight to get it otc and go by the instructions on the package then Dr. Geralyn Knee or Milana Ali can get with her on 05/09/24 to make sure she is taking the right dose and to see about Eliquis coupons.

## 2024-05-08 NOTE — Telephone Encounter (Unsigned)
 Copied from CRM [id number]. Topic: Clinical - Medication Refill >> May 08, 2024  2:22 PM [name] wrote: Medication: apixaban (ELIQUIS) 5 MG TABS tablet   Has the patient contacted their pharmacy? No (Agent: If no, request that the patient contact the pharmacy for the refill. If patient does not wish to contact the pharmacy document the reason why and proceed with request.) (Agent: If yes, when and what did the pharmacy advise?)  This is the patient's preferred pharmacy:  [facility] - [place], [place] - [address] [place] [address] Phone: [phone] Fax: [phone]  Is this the correct pharmacy for this prescription? Yes If no, delete pharmacy and type the correct one.   Has the prescription been filled recently? Yes  Is the patient out of the medication? No  Has the patient been seen for an appointment in the last year OR does the patient have an upcoming appointment? Yes  Can we respond through MyChart? Yes  Agent: Please be advised that Rx refills may take up to 3 business days. We ask that you follow-up with your pharmacy.

## 2024-05-08 NOTE — Telephone Encounter (Signed)
 Copied from CRM [id number]. Topic: Clinical - Medication Question >> May 08, 2024  2:24 PM [name] wrote: Reason for CRM: Patient wondering what Dr. Thayne Fine instructions were for pepcid, whether it was over the counter or prescription + how to take it. Patient would also like to know about any coupons for the eliquis.

## 2024-05-09 MED ORDER — APIXABAN 5 MG PO TABS: 5.0000 mg | ORAL_TABLET | Freq: Two times a day (BID) | ORAL | 1 refills | Status: DC

## 2024-05-09 NOTE — Telephone Encounter (Signed)
 Rx ok'd for eliquis refill

## 2024-05-09 NOTE — Telephone Encounter (Signed)
 I will call her about pepcid but did you talk with her at her visit about eliquis coupons or pt assistance for Eliquis?

## 2024-05-09 NOTE — Addendum Note (Signed)
 Addended by: Victorino Grates D on: 05/09/2024 04:16 PM   Modules accepted: Orders

## 2024-05-09 NOTE — Telephone Encounter (Signed)
 Pharmacy referral placed. Samples of eliquis up front for patient. She will be out of medication tomorrow morning so she is going to come pick up. Dr Geralyn Knee is aware.

## 2024-05-09 NOTE — Telephone Encounter (Signed)
 The pepcid directions and dosing are written on her after visit summary.  It is pepcid (famotidine) 20mg  - take one tablet 30 minutes before her evening meal. Also, she was asking if we had any coupons for eliquis.  If no, can see if she wants referral for pt assistance.

## 2024-05-10 ENCOUNTER — Ambulatory Visit: Payer: Self-pay | Admitting: Internal Medicine

## 2024-05-10 NOTE — Telephone Encounter (Signed)
Pt has picked up medication.  

## 2024-05-11 LAB — PROTHROMBIN GENE MUTATION

## 2024-05-13 ENCOUNTER — Telehealth: Payer: Self-pay | Admitting: Internal Medicine

## 2024-05-13 ENCOUNTER — Encounter: Payer: Self-pay | Admitting: Internal Medicine

## 2024-05-13 NOTE — Assessment & Plan Note (Signed)
 Continues on prozac  and cymbalta . She is limiting xanax . Stable.

## 2024-05-13 NOTE — Assessment & Plan Note (Signed)
 Recent diagnosis of PE. CT scan as outlined. No sob. Pain improved/resolved. Taking eliquis . Will contact cardiology regarding echo - to confirm no right heart strain. Continue eliquis . Has appt with Dr Randy Buttery.

## 2024-05-13 NOTE — Telephone Encounter (Signed)
 Please call and confirm with her she is ok with scheduling an appt with GI. Given her recent bowel issues, due colonoscopy and given increased acid reflux and belching - would like appt for evaluation of above and colonoscopy and ? EGD. She previously saw Dr Ole Berkeley. He is only doing hospital now. See if has a preference of which GI MD she prefers to see.

## 2024-05-13 NOTE — Assessment & Plan Note (Signed)
 Continue f/u with Dr Erman Hayward as outlined.

## 2024-05-13 NOTE — Assessment & Plan Note (Signed)
 Continue praluent.

## 2024-05-13 NOTE — Assessment & Plan Note (Signed)
 Low carb diet and exercise.  Follow met b and A1c.  Lab Results  Component Value Date   HGBA1C 6.1 05/07/2024

## 2024-05-13 NOTE — Assessment & Plan Note (Signed)
 Continue metoprolol . On triam/hydrochlorothiazide . Blood as outlined. Follow pressures.  No changes in medication today.

## 2024-05-13 NOTE — Assessment & Plan Note (Signed)
 Continues on protonix . Add pepcied 30 minutes before evening meal. Follow.

## 2024-05-13 NOTE — Assessment & Plan Note (Signed)
 Continues on cymbalta  and prozac . Has xanax  - uses sparingly. Appears to be stable.

## 2024-05-13 NOTE — Assessment & Plan Note (Addendum)
 Reports bowel change, increased acid reflux and some nausea as outlined. Bowels appear to be doing some better. Continues on protonix  daily. Add pepcid - taking 30 minutes before evening meal. After reviewing and given change, recommend GI evaluation - further w/up - colonoscopy, etc. Check labs, including cbc, liver panel and lipase.

## 2024-05-13 NOTE — Assessment & Plan Note (Signed)
 Check labs as outlined. Treat GI symptoms.

## 2024-05-14 NOTE — Telephone Encounter (Signed)
Called patient x2. Unable to leave message.

## 2024-05-15 NOTE — Telephone Encounter (Signed)
 Has f/u appt 07/09/24. Can d/w her more then and see if agreeable for referral.

## 2024-05-15 NOTE — Telephone Encounter (Signed)
-----   Message from Geyser sent at 05/10/2024  4:06 AM EDT ----- Notify - white blood cell count improved.  B12 is low.  I would like for her to start B12 inejctions - 1000mcg q week x 4 weeks and then 1000mcg q month. Overall sugar control stable. A1c 6.1. continue low carb diet. Pancreas test wnl. Hgb and liver function tests are wnl.

## 2024-05-15 NOTE — Telephone Encounter (Signed)
 FYI spoke with patient and she is agreeable with GI referral but would like to wait until November because she discussed with Dr Randy Buttery and that was her suggestion given that she is on eliquis . She says Dr Randy Buttery told her to wait until she finishes her blood thinners and then proceed with referral.

## 2024-05-17 ENCOUNTER — Encounter

## 2024-05-18 ENCOUNTER — Inpatient Hospital Stay: Admitting: Oncology

## 2024-05-21 ENCOUNTER — Other Ambulatory Visit (HOSPITAL_COMMUNITY): Payer: Self-pay

## 2024-05-21 DIAGNOSIS — L570 Actinic keratosis: Secondary | ICD-10-CM | POA: Diagnosis not present

## 2024-05-21 DIAGNOSIS — L119 Acantholytic disorder, unspecified: Secondary | ICD-10-CM | POA: Diagnosis not present

## 2024-05-21 DIAGNOSIS — D485 Neoplasm of uncertain behavior of skin: Secondary | ICD-10-CM | POA: Diagnosis not present

## 2024-05-24 ENCOUNTER — Telehealth: Payer: Self-pay

## 2024-05-24 ENCOUNTER — Ambulatory Visit
Admission: RE | Admit: 2024-05-24 | Discharge: 2024-05-24 | Disposition: A | Discharge status: Home / Self Care | Source: Ambulatory Visit | Attending: Internal Medicine | Admitting: Internal Medicine

## 2024-05-24 DIAGNOSIS — Z1231 Encounter for screening mammogram for malignant neoplasm of breast: Secondary | ICD-10-CM | POA: Insufficient documentation

## 2024-05-24 NOTE — Progress Notes (Signed)
 Complex Care Management Note Care Guide Note  05/24/2024 Name: Angel Riddle MRN: [id number] DOB: [birth year]   Complex Care Management Outreach Attempts: An unsuccessful telephone outreach was attempted today to offer the patient information about available complex care management services.  Follow Up Plan:  Additional outreach attempts will be made to offer the patient complex care management information and services.   Encounter Outcome:  No Answer  [facility]  [facility], [facility] Health Care Management Assistant Direct Dial: [phone]  Fax: [phone]

## 2024-05-25 ENCOUNTER — Encounter: Payer: Self-pay | Admitting: Oncology

## 2024-05-25 ENCOUNTER — Inpatient Hospital Stay: Attending: Oncology | Admitting: Oncology

## 2024-05-25 VITALS — BP 129/59 | HR 81 | Temp 97.9°F | Ht 65.0 in | Wt 183.6 lb

## 2024-05-25 DIAGNOSIS — Z7901 Long term (current) use of anticoagulants: Secondary | ICD-10-CM | POA: Insufficient documentation

## 2024-05-25 DIAGNOSIS — I2699 Other pulmonary embolism without acute cor pulmonale: Secondary | ICD-10-CM

## 2024-05-25 DIAGNOSIS — Z86711 Personal history of pulmonary embolism: Secondary | ICD-10-CM | POA: Diagnosis not present

## 2024-05-25 NOTE — Progress Notes (Signed)
 Hematology/Oncology Consult note [facility]  [phone] [phone]  Patient Care Team: Dellar Fenton, MD as PCP - General (Internal Medicine) Devorah Fonder, MD as PCP - Cardiology (Cardiology) Dellar Fenton, MD (Internal Medicine) Marquita Situ Magali Schmitz, MD (General Surgery) Avonne Boettcher, MD as Consulting Physician (Oncology)   Name of the patient: Angel Riddle  [id number]  1956/06/13   Date of visit: 05/25/24  Diagnosis-history of pulmonary embolism  Chief complaint/ Reason for visit-discuss results of blood work  Heme/Onc history: Patient is a 68 year old female with a past medical history significant for GERD low back pain and history of depression among other medical problems patient underwent L1-L2 decompression and fusion surgery on 02/08/2024. 2 months after her surgery patient was noted to have increasing right-sided pleuritic chest pain and therefore underwent CT angio chest while she was on vacation in [place] . CTA showed acute segmental/subsegmental pulmonary emboli involving the right lower lobe and small clot burden. Subsequently she underwent bilateral lower extremity Doppler which did not show any evidence of DVT. She is presently on Eliquis . Patient has never had any history of DVT or PE before. She is otherwise active for her age. Denies any changes in her appetite or weight   Results of hypercoagulable workup in May 2025 including prothrombin gene mutation, factor V Leiden mutation negative.  Protein C protein S and Antithrombin levels unremarkable.  Testing for antiphospholipid antibody syndrome negative.  Interval history-she is tolerating Eliquis  well without any significant side effects.  Denies any chest pain or exertional shortness of breath  ECOG PS- 0 Pain scale- 0   Review of systems- Review of Systems  Constitutional:  Negative for chills, fever, malaise/fatigue and weight loss.  HENT:  Negative  for congestion, ear discharge and nosebleeds.   Eyes:  Negative for blurred vision.  Respiratory:  Negative for cough, hemoptysis, sputum production, shortness of breath and wheezing.   Cardiovascular:  Negative for chest pain, palpitations, orthopnea and claudication.  Gastrointestinal:  Negative for abdominal pain, blood in stool, constipation, diarrhea, heartburn, melena, nausea and vomiting.  Genitourinary:  Negative for dysuria, flank pain, frequency, hematuria and urgency.  Musculoskeletal:  Negative for back pain, joint pain and myalgias.  Skin:  Negative for rash.  Neurological:  Negative for dizziness, tingling, focal weakness, seizures, weakness and headaches.  Endo/Heme/Allergies:  Does not bruise/bleed easily.  Psychiatric/Behavioral:  Negative for depression and suicidal ideas. The patient does not have insomnia.       Allergies  Allergen Reactions   Doxycycline Nausea And Vomiting   Gadolinium Nausea And Vomiting   Gadolinium Derivatives Nausea And Vomiting   Iodinated Contrast Media Nausea And Vomiting and Other (See Comments)     Eyes turned blood red  During a CT scan of the kidneys    Levofloxacin      Muscle Pain   Silicone     Pulls skin; anything that does not pull is recommended   Adhesive [Tape] Other (See Comments)    Pulls skin, anything that does not pull is recommended      Past Medical History:  Diagnosis Date   Allergy    Anal fissure    Anxiety    a.) on BZO (alprazolam ) PRN   Aortic atherosclerosis (HCC)    Arthritis    B12 deficiency    Basal cell carcinoma (BCC) of skin of nose    Chronic back pain    Degenerative disc disease, lumbar    Depression  DOE (dyspnea on exertion)    Dysrhythmia    Sinus Tachycardia - on Metoprolol    Female bladder prolapse    Gallstones    GERD (gastroesophageal reflux disease)    Headache    Hx of migraines, but none in a long time (as of 2025)   Heart murmur    Hx of colonic polyp    Hx: UTI  (urinary tract infection)    Hypercholesterolemia    Hypertension    Long term current use of aspirin     Lumbar stenosis    Panic attacks    Wears partial dentures      Past Surgical History:  Procedure Laterality Date   ABDOMINAL HYSTERECTOMY  1992   partial   CARPOMETACARPAL (CMC) FUSION OF THUMB Left 05/25/2023   Procedure: SUSPENSION ARTHROPLASTY OF LEFT THUMB CMC JOINT;  Surgeon: Elner Hahn, MD;  Location: [facility];  Service: Orthopedics;  Laterality: Left;   CATARACT EXTRACTION Bilateral [id number]   CHOLECYSTECTOMY  04/19/2014   COLONOSCOPY  07/02/2011   DR. Byrnett   COLONOSCOPY WITH PROPOFOL  N/A 09/25/2015   Procedure: COLONOSCOPY WITH PROPOFOL ;  Surgeon: Marnee Sink, MD;  Location: [facility];  Service: Endoscopy;  Laterality: N/A;   FOOT SURGERY Right 2009   LUMBAR FUSION  2018   MOUTH SURGERY     dental implants, veneers   POLYPECTOMY  09/25/2015   Procedure: POLYPECTOMY;  Surgeon: Marnee Sink, MD;  Location: [facility];  Service: Endoscopy;;   skin surgery Left 08/27/2013   Basal cell removal-left nostril   TONSILLECTOMY AND ADENOIDECTOMY  1969    Social History   Socioeconomic History   Marital status: Widowed    Spouse name: Not on file   Number of children: 2   Years of education: Not on file   Highest education level: Some college, no degree  Occupational History   Not on file  Tobacco Use   Smoking status: Every Day    Current packs/day: 1.00    Average packs/day: 1 pack/day for 47.7 years (47.7 ttl pk-yrs)    Types: Cigarettes    Start date: 2013    Last attempt to quit: 1975   Smokeless tobacco: Never   Tobacco comments:    Smokes 1 PPD  Vaping Use   Vaping status: Never Used  Substance and Sexual Activity   Alcohol use: Yes    Comment: Rarely   Drug use: No    Comment: Delta 9 Gummies - in the evening   Sexual activity: Not Currently    Birth control/protection: None  Other Topics Concern   Not on file  Social  History Narrative   Lives alone   Social Drivers of Health   Financial Resource Strain: Low Risk  (04/24/2024)   Received from [facility]   Overall Financial Resource Strain (CARDIA)    Difficulty of Paying Living Expenses: Not very hard  Food Insecurity: No Food Insecurity (04/27/2024)   Hunger Vital Sign    Worried About Running Out of Food in the Last Year: Never true    Ran Out of Food in the Last Year: Never true  Transportation Needs: No Transportation Needs (04/27/2024)   PRAPARE - Administrator, Civil Service (Medical): No    Lack of Transportation (Non-Medical): No  Physical Activity: Unknown (06/23/2023)   Exercise Vital Sign    Days of Exercise per Week: 0 days    Minutes of Exercise per Session: Not on file  Stress:  No Stress Concern Present (06/23/2023)   [employer] of Occupational Health - Occupational Stress Questionnaire    Feeling of Stress : Only a little  Social Connections: Unknown (06/23/2023)   Social Connection and Isolation Panel    Frequency of Communication with Friends and Family: More than three times a week    Frequency of Social Gatherings with Friends and Family: Three times a week    Attends Religious Services: More than 4 times per year    Active Member of Clubs or Organizations: Not on file    Attends Banker Meetings: Not on file    Marital Status: Widowed  Intimate Partner Violence: Not At Risk (04/27/2024)   Humiliation, Afraid, Rape, and Kick questionnaire    Fear of Current or Ex-Partner: No    Emotionally Abused: No    Physically Abused: No    Sexually Abused: No    Family History  Problem Relation Age of Onset   Hyperlipidemia Mother    Hypertension Mother    Alcohol abuse Father    Hyperlipidemia Father    Heart disease Father    Diabetes Father    Hyperlipidemia Sister    Lung cancer Brother    Hyperlipidemia Brother    Breast cancer Neg Hx      Current Outpatient Medications:     albuterol  (VENTOLIN  HFA) 108 (90 Base) MCG/ACT inhaler, Inhale 2 puffs into the lungs every 6 (six) hours as needed for wheezing or shortness of breath., Disp: 18 g, Rfl: 0   Alirocumab  (PRALUENT ) 75 MG/ML SOAJ, Inject 1 pen (75 mg) into the skin every 14 days., Disp: 6 mL, Rfl: 3   ALPRAZolam  (XANAX ) 0.5 MG tablet, Take 1 tablet (0.5 mg total) by mouth daily as needed for anxiety., Disp: 30 tablet, Rfl: 0   apixaban  (ELIQUIS ) 5 MG TABS tablet, Take 1 tablet (5 mg total) by mouth 2 (two) times daily., Disp: 60 tablet, Rfl: 1   FLUoxetine  (PROZAC ) 40 MG capsule, Take 1 capsule (40 mg total) by mouth daily., Disp: 90 capsule, Rfl: 1   HYDROcodone -acetaminophen  (NORCO) 7.5-325 MG tablet, Take by mouth., Disp: , Rfl:    methocarbamol  (ROBAXIN ) 500 MG tablet, Take 1 tablet (500 mg total) by mouth every 8 (eight) hours as needed., Disp: 60 tablet, Rfl: 0   metoprolol  succinate (TOPROL -XL) 25 MG 24 hr tablet, Take 1 tablet (25 mg total) by mouth daily. Take with or immediately following a meal., Disp: 90 tablet, Rfl: 3   oxyCODONE -acetaminophen  (PERCOCET) 7.5-325 MG tablet, Take 1 tablet by mouth., Disp: , Rfl:    pantoprazole  (PROTONIX ) 40 MG tablet, Take 1 tablet (40 mg total) by mouth daily., Disp: 90 tablet, Rfl: 3   potassium chloride  (KLOR-CON ) 10 MEQ tablet, Take 1 tablet (10 mEq total) by mouth daily., Disp: 90 tablet, Rfl: 1   pregabalin  (LYRICA ) 75 MG capsule, Take 150 mg by mouth daily., Disp: , Rfl:    triamterene -hydrochlorothiazide  (MAXZIDE -25) 37.5-25 MG tablet, 1/2 tablet per day, Disp: 30 tablet, Rfl: 2  Physical exam:  Vitals:   05/25/24 1350  TempSrc: Tympanic  Weight: 183 lb 9.6 oz (83.3 kg)  Height: 5' 5 (1.651 m)   Physical Exam  Cardiovascular:     Rate and Rhythm: Normal rate and regular rhythm.     Heart sounds: Normal heart sounds.  Pulmonary:     Effort: Pulmonary effort is normal.     Breath sounds: Normal breath sounds.  Abdominal:     General: Bowel sounds  are normal.   Skin:    General: Skin is warm and dry.   Neurological:     Mental Status: She is alert and oriented to person, place, and time.      I have personally reviewed labs listed below:    Latest Ref Rng & Units 05/07/2024    3:52 PM  CMP  Total Protein 6.0 - 8.3 g/dL 6.8   Total Bilirubin 0.2 - 1.2 mg/dL 0.4   Alkaline Phos 39 - 117 U/L 102   AST 0 - 37 U/L 15   ALT 0 - 35 U/L 8       Latest Ref Rng & Units 05/07/2024    3:52 PM  CBC  WBC 4.0 - 10.5 K/uL 10.7   Hemoglobin 12.0 - 15.0 g/dL 53.6   Hematocrit 64.4 - 46.0 % 37.3   Platelets 150.0 - 400.0 K/uL 362.0       Assessment and plan- Patient is a 68 y.o. female with history of segmental/subsegmental pulmonary emboli in the right lower lobe in May 2025 2 months following spinal decompression surgery.  She is presently on Eliquis  send this is a visit to discuss hypercoagulable results  Results of hypercoagulable workup including factor V Leiden, prothrombin gene mutation, protein C protein C S and Antithrombin III  levels as well as testing for antiphospholipid antibody syndrome negative.  Patient will stay on Eliquis  for 6 months and come off early November 2025 and I will see her at that time with D-dimer levels.  No role for repeating CT chest at that time.   Visit Diagnosis 1. Other acute pulmonary embolism without acute cor pulmonale (HCC)      Dr. Seretha Dance, MD, MPH [facility] [phone] 05/25/2024 1:54 PM

## 2024-05-25 NOTE — Progress Notes (Signed)
 C/o double vision at least weekly for the past month. Seeing PCP next week, Dr. Dellar Fenton.  Taking 2 hydrocodone /day to function, pain/soreness waist down. Still wears back brace at times. Had back surgery 02/08/24, Dr. Larrie Po. Also, sees Dr. Erman Hayward for epidural steroid injetions.

## 2024-05-28 NOTE — Progress Notes (Signed)
 Complex Care Management Note Care Guide Note  05/28/2024 Name: Angel Riddle MRN: [id number] DOB: March 16, 1956   Complex Care Management Outreach Attempts: A second unsuccessful outreach was attempted today to offer the patient with information about available complex care management services.  Follow Up Plan:  Additional outreach attempts will be made to offer the patient complex care management information and services.   Encounter Outcome:  No Answer  Dream[facility]  [facility], [facility] Health Care Management Assistant Direct Dial: [phone]  Fax: [phone]

## 2024-05-30 ENCOUNTER — Encounter: Payer: Self-pay | Admitting: Internal Medicine

## 2024-05-30 ENCOUNTER — Ambulatory Visit (INDEPENDENT_AMBULATORY_CARE_PROVIDER_SITE_OTHER): Admitting: Internal Medicine

## 2024-05-30 VITALS — BP 118/82 | HR 90 | Temp 99.9°F | Resp 20 | Ht 65.0 in | Wt 183.2 lb

## 2024-05-30 DIAGNOSIS — G8929 Other chronic pain: Secondary | ICD-10-CM | POA: Diagnosis not present

## 2024-05-30 DIAGNOSIS — E7800 Pure hypercholesterolemia, unspecified: Secondary | ICD-10-CM | POA: Diagnosis not present

## 2024-05-30 DIAGNOSIS — I700 Atherosclerosis of aorta: Secondary | ICD-10-CM | POA: Diagnosis not present

## 2024-05-30 DIAGNOSIS — K219 Gastro-esophageal reflux disease without esophagitis: Secondary | ICD-10-CM

## 2024-05-30 DIAGNOSIS — H532 Diplopia: Secondary | ICD-10-CM

## 2024-05-30 DIAGNOSIS — R8290 Unspecified abnormal findings in urine: Secondary | ICD-10-CM

## 2024-05-30 DIAGNOSIS — F320 Major depressive disorder, single episode, mild: Secondary | ICD-10-CM | POA: Diagnosis not present

## 2024-05-30 DIAGNOSIS — R739 Hyperglycemia, unspecified: Secondary | ICD-10-CM | POA: Diagnosis not present

## 2024-05-30 DIAGNOSIS — R051 Acute cough: Secondary | ICD-10-CM

## 2024-05-30 DIAGNOSIS — I10 Essential (primary) hypertension: Secondary | ICD-10-CM | POA: Diagnosis not present

## 2024-05-30 DIAGNOSIS — M549 Dorsalgia, unspecified: Secondary | ICD-10-CM

## 2024-05-30 MED ORDER — ALBUTEROL SULFATE HFA 108 (90 BASE) MCG/ACT IN AERS: 2.0000 | INHALATION_SPRAY | Freq: Four times a day (QID) | RESPIRATORY_TRACT | 0 refills | Status: AC | PRN

## 2024-05-30 MED ORDER — PREDNISONE 10 MG PO TABS: ORAL_TABLET | ORAL | 0 refills | Status: DC

## 2024-05-30 NOTE — Progress Notes (Signed)
 Subjective:    Patient ID: Angel Riddle, female    DOB: December 08, 1955, 68 y.o.   MRN: [id number]  Patient here for  Chief Complaint  Patient presents with   Medical Management of Chronic Issues    2 month follow up    Cough    Started last night  clear mucus    HPI Here for a scheduled follow up. Follow up regarding hypertension, hypercholesterolemia and increased stress. S/p L1-L2 decompression and fusion surgery 3//5/35. Two months after the surgery noticed right sided pleuritic chest pain. CTA - PE. Recommended eliquis  for 6 months. Planning to come off 10/2024. Seeing Dr Melanee. Had f/u with ortho 04/24/24 - right thumb CMC OA - injection. Saw Dr Avanell 04/16/24 - continue tylenol , norco and lyrica . ESI - L2-3. Continues on cymbalta  and prozac . Has noticed intermittent episodes of double vision over the last two years. States first episode occurred when outside/hot. On beach. Covered one eye - corrected. (Corrected when covered each eye - one at a time). Can last 30 seconds - 10 minutes. States it feels like her left eye is turned in. Last episode approximately 10 days ago. No headache. No known trigger. Has appt with Dr Laurice to evaluate. Is on eliquis  for above. Has f/u with Dr Mavis 06/05/24 - for f/u back, hip and leg pain. Taking tylenol , lyrica  and hydrocodone . Discussed magnesium  glycinate for muscle cramps. Reports that one week ago, was concerned regarding UTI. Took left over augmentin  for 7 days. Noticed over the last two days, increased urinary odor. Concerned regarding UTI. No vaginal symptoms. Bowels ok. Some cough last night. Throat irritated. No chest congestion. Increased cough.    Past Medical History:  Diagnosis Date   Allergy    Anal fissure    Anxiety    a.) on BZO (alprazolam ) PRN   Aortic atherosclerosis (HCC)    Arthritis    B12 deficiency    Basal cell carcinoma (BCC) of skin of nose    Chronic back pain    Degenerative disc disease, lumbar    Depression    DOE  (dyspnea on exertion)    Dysrhythmia    Sinus Tachycardia - on Metoprolol    Female bladder prolapse    Gallstones    GERD (gastroesophageal reflux disease)    Headache    Hx of migraines, but none in a long time (as of 2025)   Heart murmur    Hx of colonic polyp    Hx: UTI (urinary tract infection)    Hypercholesterolemia    Hypertension    Long term current use of aspirin     Lumbar stenosis    Panic attacks    Wears partial dentures    Past Surgical History:  Procedure Laterality Date   ABDOMINAL HYSTERECTOMY  1992   partial   CARPOMETACARPAL (CMC) FUSION OF THUMB Left 05/25/2023   Procedure: SUSPENSION ARTHROPLASTY OF LEFT THUMB CMC JOINT;  Surgeon: Edie Norleen PARAS, MD;  Location: [facility];  Service: Orthopedics;  Laterality: Left;   CATARACT EXTRACTION Bilateral 7989,7988   CHOLECYSTECTOMY  04/19/2014   COLONOSCOPY  07/02/2011   DR. Byrnett   COLONOSCOPY WITH PROPOFOL  N/A 09/25/2015   Procedure: COLONOSCOPY WITH PROPOFOL ;  Surgeon: Rogelia Copping, MD;  Location: [facility];  Service: Endoscopy;  Laterality: N/A;   FOOT SURGERY Right 2009   LUMBAR FUSION  2018   MOUTH SURGERY     dental implants, veneers   POLYPECTOMY  09/25/2015   Procedure: POLYPECTOMY;  Surgeon: Rogelia Copping, MD;  Location: [facility];  Service: Endoscopy;;   skin surgery Left 08/27/2013   Basal cell removal-left nostril   TONSILLECTOMY AND ADENOIDECTOMY  1969   Family History  Problem Relation Age of Onset   Hyperlipidemia Mother    Hypertension Mother    Alcohol abuse Father    Hyperlipidemia Father    Heart disease Father    Diabetes Father    Hyperlipidemia Sister    Lung cancer Brother    Hyperlipidemia Brother    Breast cancer Neg Hx    Social History   Socioeconomic History   Marital status: Widowed    Spouse name: Not on file   Number of children: 2   Years of education: Not on file   Highest education level: Some college, no degree  Occupational History   Not on  file  Tobacco Use   Smoking status: Every Day    Current packs/day: 1.00    Average packs/day: 1 pack/day for 47.7 years (47.7 ttl pk-yrs)    Types: Cigarettes    Start date: 2013    Last attempt to quit: 1975   Smokeless tobacco: Never   Tobacco comments:    Smokes 1 PPD  Vaping Use   Vaping status: Never Used  Substance and Sexual Activity   Alcohol use: Yes    Comment: Rarely   Drug use: No    Comment: Delta 9 Gummies - in the evening   Sexual activity: Not Currently    Birth control/protection: None  Other Topics Concern   Not on file  Social History Narrative   Lives alone   Social Drivers of Health   Financial Resource Strain: Low Risk  (04/24/2024)   Received from [facility]   Overall Financial Resource Strain (CARDIA)    Difficulty of Paying Living Expenses: Not very hard  Food Insecurity: No Food Insecurity (04/27/2024)   Hunger Vital Sign    Worried About Running Out of Food in the Last Year: Never true    Ran Out of Food in the Last Year: Never true  Transportation Needs: No Transportation Needs (04/27/2024)   PRAPARE - Administrator, Civil Service (Medical): No    Lack of Transportation (Non-Medical): No  Physical Activity: Unknown (06/23/2023)   Exercise Vital Sign    Days of Exercise per Week: 0 days    Minutes of Exercise per Session: Not on file  Stress: No Stress Concern Present (06/23/2023)   [employer] of Occupational Health - Occupational Stress Questionnaire    Feeling of Stress : Only a little  Social Connections: Unknown (06/23/2023)   Social Connection and Isolation Panel    Frequency of Communication with Friends and Family: More than three times a week    Frequency of Social Gatherings with Friends and Family: Three times a week    Attends Religious Services: More than 4 times per year    Active Member of Clubs or Organizations: Not on file    Attends Banker Meetings: Not on file    Marital  Status: Widowed     Review of Systems  Constitutional:  Negative for appetite change and fever.  HENT:  Negative for congestion and sinus pressure.   Respiratory:  Positive for cough. Negative for chest tightness and shortness of breath.   Cardiovascular:  Negative for chest pain, palpitations and leg swelling.  Gastrointestinal:  Negative for abdominal pain, diarrhea, nausea and vomiting.  Genitourinary:  Negative  for difficulty urinating.       Urinary odor as outlined. No vaginal symptoms.   Musculoskeletal:  Positive for back pain.       Hip and leg pain as outlined.   Skin:  Negative for color change and rash.  Neurological:  Negative for dizziness and headaches.  Psychiatric/Behavioral:  Negative for agitation and dysphoric mood.        Objective:     BP 118/82   Pulse 90   Temp 99.9 F (37.7 C)   Resp 20   Ht 5' 5 (1.651 m)   Wt 183 lb 4 oz (83.1 kg)   SpO2 98%   BMI 30.49 kg/m  Wt Readings from Last 3 Encounters:  05/30/24 183 lb 4 oz (83.1 kg)  05/25/24 183 lb 9.6 oz (83.3 kg)  05/07/24 183 lb (83 kg)    Physical Exam Vitals reviewed.  Constitutional:      General: She is not in acute distress.    Appearance: Normal appearance.  HENT:     Head: Normocephalic and atraumatic.     Right Ear: External ear normal.     Left Ear: External ear normal.     Mouth/Throat:     Pharynx: No oropharyngeal exudate or posterior oropharyngeal erythema.   Eyes:     General: No scleral icterus.       Right eye: No discharge.        Left eye: No discharge.     Conjunctiva/sclera: Conjunctivae normal.   Neck:     Thyroid : No thyromegaly.   Cardiovascular:     Rate and Rhythm: Normal rate and regular rhythm.  Pulmonary:     Effort: No respiratory distress.     Breath sounds: Normal breath sounds. No wheezing.     Comments: Increased cough with forced expiration.  Abdominal:     General: Bowel sounds are normal.     Palpations: Abdomen is soft.     Tenderness:  There is no abdominal tenderness.   Musculoskeletal:        General: No swelling or tenderness.     Cervical back: Neck supple. No tenderness.  Lymphadenopathy:     Cervical: No cervical adenopathy.   Skin:    Findings: No erythema or rash.   Neurological:     Mental Status: She is alert.   Psychiatric:        Mood and Affect: Mood normal.        Behavior: Behavior normal.         Outpatient Encounter Medications as of 05/30/2024  Medication Sig   Alirocumab  (PRALUENT ) 75 MG/ML SOAJ Inject 1 pen (75 mg) into the skin every 14 days.   ALPRAZolam  (XANAX ) 0.5 MG tablet Take 1 tablet (0.5 mg total) by mouth daily as needed for anxiety.   apixaban  (ELIQUIS ) 5 MG TABS tablet Take 1 tablet (5 mg total) by mouth 2 (two) times daily.   FLUoxetine  (PROZAC ) 40 MG capsule Take 1 capsule (40 mg total) by mouth daily.   HYDROcodone -acetaminophen  (NORCO) 7.5-325 MG tablet Take by mouth.   methocarbamol  (ROBAXIN ) 500 MG tablet Take 1 tablet (500 mg total) by mouth every 8 (eight) hours as needed.   oxyCODONE -acetaminophen  (PERCOCET) 7.5-325 MG tablet Take 1 tablet by mouth.   pantoprazole  (PROTONIX ) 40 MG tablet Take 1 tablet (40 mg total) by mouth daily.   potassium chloride  (KLOR-CON ) 10 MEQ tablet Take 1 tablet (10 mEq total) by mouth daily.   predniSONE  (DELTASONE ) 10 MG tablet  Take 4 tablets x 1 day and then decrease by 1/2 tablet per day until down to zero mg.   pregabalin  (LYRICA ) 75 MG capsule Take 150 mg by mouth daily.   triamterene -hydrochlorothiazide  (MAXZIDE -25) 37.5-25 MG tablet 1/2 tablet per day   [DISCONTINUED] albuterol  (VENTOLIN  HFA) 108 (90 Base) MCG/ACT inhaler Inhale 2 puffs into the lungs every 6 (six) hours as needed for wheezing or shortness of breath.   albuterol  (VENTOLIN  HFA) 108 (90 Base) MCG/ACT inhaler Inhale 2 puffs into the lungs every 6 (six) hours as needed for wheezing or shortness of breath.   metoprolol  succinate (TOPROL -XL) 25 MG 24 hr tablet Take 1 tablet  (25 mg total) by mouth daily. Take with or immediately following a meal. (Patient not taking: Reported on 05/30/2024)   No facility-administered encounter medications on file as of 05/30/2024.     Lab Results  Component Value Date   WBC 10.7 (H) 05/07/2024   HGB 13.0 05/07/2024   HCT 37.3 05/07/2024   PLT 362.0 05/07/2024   GLUCOSE 113 (H) 05/03/2024   CHOL 238 (H) 05/04/2024   TRIG (H) 05/04/2024    418.0 Triglyceride is over 400; calculations on Lipids are invalid.   HDL 47.60 05/04/2024   LDLDIRECT 139.0 05/04/2024   LDLCALC 119 (H) 11/15/2023   ALT 8 05/07/2024   AST 15 05/07/2024   NA 139 05/03/2024   K 3.6 05/03/2024   CL 102 05/03/2024   CREATININE 1.00 05/03/2024   BUN 16 05/03/2024   CO2 25 05/03/2024   TSH 0.72 11/15/2023   HGBA1C 6.1 05/07/2024    MM 3D SCREENING MAMMOGRAM BILATERAL BREAST Result Date: 05/28/2024 CLINICAL DATA:  Screening. EXAM: DIGITAL SCREENING BILATERAL MAMMOGRAM WITH TOMOSYNTHESIS AND CAD TECHNIQUE: Bilateral screening digital craniocaudal and mediolateral oblique mammograms were obtained. Bilateral screening digital breast tomosynthesis was performed. The images were evaluated with computer-aided detection. COMPARISON:  Previous exam(s). ACR Breast Density Category b: There are scattered areas of fibroglandular density. FINDINGS: There are no findings suspicious for malignancy. IMPRESSION: No mammographic evidence of malignancy. A result letter of this screening mammogram will be mailed directly to the patient. RECOMMENDATION: Screening mammogram in one year. (Code:SM-B-01Y) BI-RADS CATEGORY  1: Negative. Electronically Signed   By: Inocente Ast M.D.   On: 05/28/2024 15:20       Assessment & Plan:  Bad odor of urine Assessment & Plan: Recently on augmentin . Will check urine and culture to confirm if infection present. No vaginal symptoms. Hold on abx until culture results.   Orders: -     Urinalysis, Routine w reflex microscopic -      Urine Culture  Aortic atherosclerosis (HCC) Assessment & Plan: Continue praluent .    Chronic midline back pain, unspecified back location Assessment & Plan: Chronic back, hip and leg pain as outlined. Has seen Dr Avanell. On lyrica , hydrocodone . Planning to f/u with Dr Mavis 06/05/24.    Depression, major, single episode, mild (HCC) Assessment & Plan: Continue cymbalta  and prozac . Limiting use of xanax . Appears to be stable. Follow.    Double vision Assessment & Plan: Reports persistent intermittent episodes of double vision for years. In reviewing, it appears had w/up in 2022 for intermittent episodes of double vision. MRI 04/2021 - Mild chronic white matter ischemic changes. No acute abnormality. Carotid ultrasound 04/2021 - no stenosis. Evaluated by ophthalmology then. She is on eliquis . Blood pressure controlled. Continues treatment for cholesterol. Resolves with covering one eye. F/u with ophthalmology as planned.    Gastroesophageal reflux disease,  unspecified whether esophagitis present Assessment & Plan: Continue protonix . No upper symptoms reported today.    Hypercholesterolemia Assessment & Plan: Continue praluent . Continue low cholesterol diet and exercise. Follow lipid panel.   Lab Results  Component Value Date   CHOL 238 (H) 05/04/2024   HDL 47.60 05/04/2024   LDLCALC 119 (H) 11/15/2023   LDLDIRECT 139.0 05/04/2024   TRIG (H) 05/04/2024    418.0 Triglyceride is over 400; calculations on Lipids are invalid.   CHOLHDL 5 05/04/2024      Hyperglycemia Assessment & Plan: Low carb diet and exercise.  Follow met b and A1c.  Lab Results  Component Value Date   HGBA1C 6.1 05/07/2024     Primary hypertension Assessment & Plan: Continue metoprolol . On triam/hydrochlorothiazide . Blood as outlined. Follow pressures.  No changes in medication. Follow met b.    Acute cough Assessment & Plan: Increased cough as outlined.  Increased cough with forced expiration.   Hold abx. Continue delsym. Prednisone  taper as directed. Follow.    Other orders -     Albuterol  Sulfate HFA; Inhale 2 puffs into the lungs every 6 (six) hours as needed for wheezing or shortness of breath.  Dispense: 18 g; Refill: 0 -     predniSONE ; Take 4 tablets x 1 day and then decrease by 1/2 tablet per day until down to zero mg.  Dispense: 18 tablet; Refill: 0   I spent 45 minutes with the patient. Time spent discussing her current concerns and symptoms.  Specifically time spent discussing her double vision, back pain and urinary symptoms. Time also spent discussing further w/up, evaluation and treatment.    Allena Hamilton, MD

## 2024-05-30 NOTE — Progress Notes (Signed)
 Medication Samples have been provided to the patient.  Drug name: Eliquis       Strength: 5 mg        Qty: 3 boxes LOT: [id number]  Exp.Date: 02/03/2025  Dosing instructions: Take 1 tablet (5 mg total) by mouth 2 (two) times daily.   The patient has been instructed regarding the correct time, dose, and frequency of taking this medication, including desired effects and most common side effects.   Angel Riddle 2:28 PM 05/30/2024

## 2024-05-31 ENCOUNTER — Ambulatory Visit: Admitting: Internal Medicine

## 2024-05-31 ENCOUNTER — Ambulatory Visit: Payer: Self-pay | Admitting: Internal Medicine

## 2024-05-31 LAB — URINALYSIS, ROUTINE W REFLEX MICROSCOPIC
Bilirubin Urine: NEGATIVE
Hgb urine dipstick: NEGATIVE
Ketones, ur: NEGATIVE
Leukocytes,Ua: NEGATIVE
Nitrite: NEGATIVE
RBC / HPF: NONE SEEN (ref 0–?)
Specific Gravity, Urine: 1.01 (ref 1.000–1.030)
Total Protein, Urine: NEGATIVE
Urine Glucose: NEGATIVE
Urobilinogen, UA: 0.2 (ref 0.0–1.0)
pH: 6 (ref 5.0–8.0)

## 2024-05-31 LAB — URINE CULTURE: SPECIMEN QUALITY:: ADEQUATE

## 2024-06-01 LAB — URINE CULTURE: MICRO NUMBER:: 16623798

## 2024-06-01 MED ORDER — AMOXICILLIN 875 MG PO TABS
875.0000 mg | ORAL_TABLET | Freq: Two times a day (BID) | ORAL | 0 refills | Status: AC
Start: 2024-06-01 — End: 2024-06-08

## 2024-06-01 NOTE — Progress Notes (Signed)
 Complex Care Management Note Care Guide Note  06/01/2024 Name: Angel Riddle MRN: [id number] DOB: 22-Jul-1956   Complex Care Management Outreach Attempts: A third unsuccessful outreach was attempted today to offer the patient with information about available complex care management services.  Follow Up Plan:  No further outreach attempts will be made at this time. We have been unable to contact the patient to offer or enroll patient in complex care management services.  Encounter Outcome:  No Answer  Dreama [facility]  [facility], [facility] Health Care Management Assistant Direct Dial: [phone]  Fax: [phone]

## 2024-06-03 ENCOUNTER — Encounter: Payer: Self-pay | Admitting: Internal Medicine

## 2024-06-03 NOTE — Assessment & Plan Note (Signed)
 Continue praluent.

## 2024-06-03 NOTE — Assessment & Plan Note (Signed)
 Increased cough as outlined.  Increased cough with forced expiration.  Hold abx. Continue delsym. Prednisone  taper as directed. Follow.

## 2024-06-03 NOTE — Assessment & Plan Note (Signed)
 Recently on augmentin . Will check urine and culture to confirm if infection present. No vaginal symptoms. Hold on abx until culture results.

## 2024-06-03 NOTE — Assessment & Plan Note (Signed)
 Chronic back, hip and leg pain as outlined. Has seen Dr Avanell. On lyrica , hydrocodone . Planning to f/u with Dr Mavis 06/05/24.

## 2024-06-03 NOTE — Assessment & Plan Note (Signed)
 Continue cymbalta  and prozac . Limiting use of xanax . Appears to be stable. Follow.

## 2024-06-03 NOTE — Assessment & Plan Note (Signed)
 Continue praluent . Continue low cholesterol diet and exercise. Follow lipid panel.   Lab Results  Component Value Date   CHOL 238 (H) 05/04/2024   HDL 47.60 05/04/2024   LDLCALC 119 (H) 11/15/2023   LDLDIRECT 139.0 05/04/2024   TRIG (H) 05/04/2024    418.0 Triglyceride is over 400; calculations on Lipids are invalid.   CHOLHDL 5 05/04/2024

## 2024-06-03 NOTE — Assessment & Plan Note (Signed)
 Continue metoprolol . On triam/hydrochlorothiazide . Blood as outlined. Follow pressures.  No changes in medication. Follow met b.

## 2024-06-03 NOTE — Assessment & Plan Note (Signed)
Continue protonix.  No upper symptoms reported today.  

## 2024-06-03 NOTE — Assessment & Plan Note (Signed)
 Reports persistent intermittent episodes of double vision for years. In reviewing, it appears had w/up in 2022 for intermittent episodes of double vision. MRI 04/2021 - Mild chronic white matter ischemic changes. No acute abnormality. Carotid ultrasound 04/2021 - no stenosis. Evaluated by ophthalmology then. She is on eliquis . Blood pressure controlled. Continues treatment for cholesterol. Resolves with covering one eye. F/u with ophthalmology as planned.

## 2024-06-03 NOTE — Assessment & Plan Note (Signed)
 Low carb diet and exercise.  Follow met b and A1c.  Lab Results  Component Value Date   HGBA1C 6.1 05/07/2024

## 2024-06-05 ENCOUNTER — Other Ambulatory Visit: Payer: Self-pay | Admitting: Pharmacist

## 2024-06-05 DIAGNOSIS — M4316 Spondylolisthesis, lumbar region: Secondary | ICD-10-CM | POA: Diagnosis not present

## 2024-06-05 NOTE — Progress Notes (Signed)
 Incoming Call Patient returning call from pharmacist regarding Eliquis  cost.   Summary Patient reports that upon her PE diagnosis, she was provided the Eliquis  30-day free trial card for her first month supply. Since then, has received two samples form clinic (x3 weeks each).   Reports she currently has 15.5 days of Eliquis  remaining at home. Denies missed doses at this time.  Is not sure what her cost would be through insurance. Notes she has known people on Eliquis  who report it is very expensive so she was nervous when it was prescribed.   Current income = SSI + 378/mo  Confirms ongoing insurance coverage through [facility]. Notes also having coverage through [employer].  [employer]A Test Claim: $47 for 30 days T[employer]are Estimation Tool: $43/month (retail x2 months then must fill @[facility]  Home Delivery), $38/month (home delivery)  PLAN We discussed what a feasible copay would be for her each month. Ideally, she would like to see her copay under $100/month.  $47 / month through [employer]TA (per test claim at [facility]) is feasible for patient  Will review [employer] benefit to see if any additional coverage/benefit Reviewed limitations of patient assistance with her including the 3% OOP spend requirement

## 2024-06-05 NOTE — Progress Notes (Signed)
.  Brief Telephone Documentation Reason for Call: Medication Access concerns regarding Eliquis    Summary of Call: Called patient 06/05/24. No answer. Unable to leave voicemail due to voice mail box has not been set up yet  Chart Review: Rx Insurance: Yes - [employer]  Test Claim  Eliquis  Copay: $47 per month  Remaining Deductible: $0  Patient assistance options  Eliquis  options are very limited for Medicare patients unfortunately.  Medicare patients must spend 3% of annual income on Rx medications before PAP will approve them for free medication. For most patients, this is not achievable. Based on her Rx list consisting of mostly generics, likely has not reached this amount.  Per refill history, appears she is still getting Praluent  monthly for free through [employer]. Lorrene remains active through 08/26/24.   Manuelita FABIENE Kobs, PharmD Clinical Pharmacist [facility] [phone]

## 2024-06-14 ENCOUNTER — Other Ambulatory Visit: Payer: Self-pay

## 2024-06-29 ENCOUNTER — Telehealth: Payer: Self-pay

## 2024-06-29 ENCOUNTER — Other Ambulatory Visit: Payer: Self-pay | Admitting: Pharmacist

## 2024-06-29 NOTE — Progress Notes (Signed)
 Brief Telephone Documentation Reason for Call: Patient left message regarding question related to her medications.   Summary of Call: Called patient. She states her pharmacy told her her [employer] grant expired in August.   Per previous chart notes, expiration expires 08/26/2024. Confirmed this in the [facility] provider protal as well.     Follow Up: Patient given direct line for further questions/concerns. Aware we can re-enroll in September.   Angel Riddle, PharmD Clinical Pharmacist [facility] [phone]

## 2024-06-29 NOTE — Telephone Encounter (Signed)
 Copied from CRM [id number]. Topic: Clinical - Medication Question >> Jun 29, 2024 11:34 AM Revonda D wrote: Reason for CRM: Pt is calling to speak with the Pharmacist in regards to the  Alirocumab  (PRALUENT ) 75 MG/ML SOAJ. Pt would like a callback today if possible.

## 2024-07-05 DIAGNOSIS — Z961 Presence of intraocular lens: Secondary | ICD-10-CM | POA: Diagnosis not present

## 2024-07-05 DIAGNOSIS — H532 Diplopia: Secondary | ICD-10-CM | POA: Diagnosis not present

## 2024-07-05 DIAGNOSIS — H43813 Vitreous degeneration, bilateral: Secondary | ICD-10-CM | POA: Diagnosis not present

## 2024-07-09 ENCOUNTER — Ambulatory Visit: Admitting: Internal Medicine

## 2024-07-12 ENCOUNTER — Other Ambulatory Visit: Payer: Self-pay

## 2024-07-12 DIAGNOSIS — M5416 Radiculopathy, lumbar region: Secondary | ICD-10-CM | POA: Diagnosis not present

## 2024-07-12 DIAGNOSIS — M48062 Spinal stenosis, lumbar region with neurogenic claudication: Secondary | ICD-10-CM | POA: Diagnosis not present

## 2024-07-17 ENCOUNTER — Ambulatory Visit: Payer: Self-pay

## 2024-07-17 DIAGNOSIS — H539 Unspecified visual disturbance: Secondary | ICD-10-CM

## 2024-07-17 DIAGNOSIS — H532 Diplopia: Secondary | ICD-10-CM

## 2024-07-17 NOTE — Telephone Encounter (Signed)
 FYI Only or Action Required?: Action required by provider: update on patient condition and lab or test result follow-up needed.  Patient was last seen in primary care on 05/30/2024 by [name], MD.  Called Nurse Triage reporting Dizziness.  Symptoms began several years ago.  Interventions attempted: Nothing.  Symptoms are: gradually worsening.  Triage Disposition: See PCP Within 2 Weeks  Patient/caregiver understands and will follow disposition?: Yes        Copied from CRM [id number]. Topic: Clinical - Red Word Triage >> Jul 17, 2024  2:02 PM Angel Riddle wrote: Red Word that prompted transfer to Nurse Triage:  intermittent double vision, occasional dizziness. Reason for Disposition  [1] Blurred vision or visual changes AND [2] gradual onset (e.g., weeks, months)  Answer Assessment - Initial Assessment Questions 1. DESCRIPTION: How has your vision changed? (e.g., complete vision loss, blurred vision, double vision, floaters, etc.)   Blurred and   Double vision 2. LOCATION: One or both eyes? If one, ask: Which eye?     Only when looking out of both eye 3. SEVERITY: Can you see anything? If Yes, ask: What can you see? (e.g., fine print)     Yes, when happening- vision blurry then she sees double like one on top of the other,  4. ONSET: When did this begin? Did it start suddenly or has this been gradual?     Has been ongoing for years 5. PATTERN: Does this come and go, or has it been constant since it started?     Comes and goes 6. PAIN: Is there any pain in your eye(s)?  (Scale 1-10; or mild, moderate, severe)     No pain 7. CONTACTS-GLASSES: Do you wear contacts or glasses?     Glass  8. CAUSE: What do you think is causing this visual problem?     unknown 9. OTHER SYMPTOMS: Do you have any other symptoms? (e.g., confusion, headache, arm or leg weakness, speech problems)     Dizziness  Protocols used: Vision Loss or Change-A-AH

## 2024-07-17 NOTE — Telephone Encounter (Signed)
 Called patient to confirm nothing acute. Blurred/double vision in both eyes. If she covers one eye at a time- no double vision. This has been going on for 3 years but has become more noticeable since May/June. She says that last time she was here you talked about doing MRI if she saw here eye doctor and everything checked out ok. She saw her optometrist 2 weeks ago and work up was unrevealing of anything that could be causing the issues with her double vision. She wants to order MRI

## 2024-07-17 NOTE — Telephone Encounter (Signed)
 Pt was calling to update Dr. Glendia on that she saw her Optometrist about 2 weeks about the blurred and double vision and nothing was found. States that she spoke with Dr. Glendia about maybe ordering an MRI if nothing was found by the optometrist. No appts available.  Please advise.

## 2024-07-18 DIAGNOSIS — M48062 Spinal stenosis, lumbar region with neurogenic claudication: Secondary | ICD-10-CM | POA: Diagnosis not present

## 2024-07-18 DIAGNOSIS — M5416 Radiculopathy, lumbar region: Secondary | ICD-10-CM | POA: Diagnosis not present

## 2024-07-18 NOTE — Addendum Note (Signed)
 Addended by: GLENDIA ALLENA RAMAN on: 07/18/2024 03:12 PM   Modules accepted: Orders

## 2024-07-18 NOTE — Telephone Encounter (Signed)
 Order signed for neurology referral. Also, need copy of ophthalmology office visit. Please hold message until we receive information.

## 2024-07-18 NOTE — Telephone Encounter (Signed)
 Called PT VM not set up please relay message below when she returns call and notify office

## 2024-07-18 NOTE — Telephone Encounter (Unsigned)
 Copied from CRM [id number]. Topic: General - Other >> Jul 18, 2024  9:15 AM Robinson H wrote: Reason for CRM: Patient returned call to office,  gave patient message from provider as stated, patient acknowledged. Patient also agreed to going to see a Neurologist, states she doesn't have a preference wherever Dr. Glendia recommends but would rather stay local.  [place] [phone]

## 2024-07-18 NOTE — Addendum Note (Signed)
 Addended by: LEARTA PORTO D on: 07/18/2024 09:58 AM   Modules accepted: Orders

## 2024-07-18 NOTE — Telephone Encounter (Signed)
 Patient does not have a preference of a neurologist. Wants to stay local. Pended a neuro referral to Dr Maree for you to sign.

## 2024-07-18 NOTE — Telephone Encounter (Signed)
 If persistent blurred/double vision, she needs to be reevaluated. I would like to refer her to neurology for further evaluation. If agreeable, see if has a preference of which MD she prefers to see.

## 2024-07-19 ENCOUNTER — Other Ambulatory Visit: Payer: Self-pay | Admitting: Internal Medicine

## 2024-07-19 NOTE — Telephone Encounter (Signed)
 Pt aware of below. Records requested from [facility]- Dr Mittie. Holding message until records received.

## 2024-07-20 NOTE — Telephone Encounter (Signed)
 Revieed PDMP. Rx for alprazolam  #30 with no refills.

## 2024-07-23 DIAGNOSIS — M5416 Radiculopathy, lumbar region: Secondary | ICD-10-CM | POA: Diagnosis not present

## 2024-07-23 DIAGNOSIS — M545 Low back pain, unspecified: Secondary | ICD-10-CM | POA: Diagnosis not present

## 2024-07-23 DIAGNOSIS — M5380 Other specified dorsopathies, site unspecified: Secondary | ICD-10-CM | POA: Diagnosis not present

## 2024-07-23 DIAGNOSIS — Z03818 Encounter for observation for suspected exposure to other biological agents ruled out: Secondary | ICD-10-CM | POA: Diagnosis not present

## 2024-07-23 DIAGNOSIS — R509 Fever, unspecified: Secondary | ICD-10-CM | POA: Diagnosis not present

## 2024-07-23 DIAGNOSIS — M48062 Spinal stenosis, lumbar region with neurogenic claudication: Secondary | ICD-10-CM | POA: Diagnosis not present

## 2024-07-23 DIAGNOSIS — I959 Hypotension, unspecified: Secondary | ICD-10-CM | POA: Diagnosis not present

## 2024-07-24 ENCOUNTER — Ambulatory Visit: Payer: Self-pay

## 2024-07-24 NOTE — Telephone Encounter (Signed)
 Please review notes from [facility] walk in yesterday. She has not rechecked her BP today but not having symptoms like she was having yesterday except for the leg pain. No fever. Feels better today. She is scheduled to see Dr Avanell tomorrow for her leg pain. The provider at walk in just mentioned that she should let you know that she was going out of town leaving Saturday returning the following Sunday.

## 2024-07-24 NOTE — Telephone Encounter (Signed)
 See notes below, pt states that she has no concerns at this time. Pt calling as she was advised to update her PCP and let PCP know that she is heading out of town this weekend if PCP feels pt is well enough to travel. Pt has no questions nor concerns at this time.   Copied from CRM #[id number]. Topic: Clinical - Red Word Triage >> Jul 24, 2024  9:08 AM Rosina BIRCH wrote: Reason for RMF:[id number] called stating she went to podiatry last wednesday and had an injection done then she went on yesterday to podiatry. Patient stated because she had a fever of 100.5 and her blood pressure was 65/49 they took her to [facility] walk in. [facility] did blood work and her white blood cells were up so they gave her an antibiotic shot and pills. Patient stated the pain is in mostly in her leg and she is sliding her right root due to the pain to try to relieve the pain. Patient stated she does not pick up her right foot up but she turns her foot to the left inward to the other foot. Patient want to see if they can burn some nerves. Patient takes 15mg  of hydrocodone  and he did not do anything until last night. Patient is leaving Saturday and she will be gone until 8/31 CB [phone]

## 2024-07-25 NOTE — Telephone Encounter (Signed)
 See me before calling. Reviewed acute care note. Blood pressure low. Had reported dizziness, etc. Need to confirm how she is feeling today. Need to know how blood pressures are doing. Where is she traveling?

## 2024-07-25 NOTE — Telephone Encounter (Unsigned)
 Copied from CRM [id number]. Topic: General - Other >> Jul 25, 2024  1:48 PM Burnard DEL wrote: Reason for CRM: Patient returned call to [facility] CMA.Patient reported that she feels okay today.She has a low grade fever that has not gone over 100.5. She has just been taking 800 mg tylenol .No dizziness. She has not checked her blood pressure today.She will be traveling to  [facility] on Saturday. She will be back on 31st or 1st. She has not heard back from Dr Loreli office to get an appointment regarding her double vision.

## 2024-07-25 NOTE — Telephone Encounter (Signed)
Called patient and was unable to leave message 

## 2024-07-26 NOTE — Telephone Encounter (Signed)
 LM for  Eye. Will try back later/

## 2024-07-26 NOTE — Telephone Encounter (Signed)
 Called patient to confirm she is doing ok. No dizziness, no fever, has not checked blood pressure. Feeling better other than still having pain in her leg. She is traveling with someone and if has any acute issues, agreed to be evaluated. She is going to [facility].

## 2024-07-26 NOTE — Telephone Encounter (Signed)
 Spoke with Katelyn at [facility]. Pt needs to come over to sign records release for records to be sent. Pt says she will go by there when she gets back from vacation.

## 2024-07-27 ENCOUNTER — Ambulatory Visit: Payer: Self-pay

## 2024-07-27 DIAGNOSIS — H539 Unspecified visual disturbance: Secondary | ICD-10-CM

## 2024-07-27 DIAGNOSIS — H532 Diplopia: Secondary | ICD-10-CM

## 2024-07-27 NOTE — Telephone Encounter (Signed)
 2nd attempt made. Mychart message will be sent

## 2024-07-27 NOTE — Telephone Encounter (Signed)
 Called pt was unable to reach her. No vm set up to leave vm

## 2024-07-27 NOTE — Telephone Encounter (Signed)
 FYI Only or Action Required?: FYI only for provider.  Patient was last seen in primary care on 05/30/2024 by [name], MD.  [facility] Nurse Triage reporting Triage.  Symptoms began several days ago.  Interventions attempted: Rest, hydration, or home remedies.  Symptoms are: unchanged.  Triage Disposition: See HCP Within 4 Hours (Or PCP Triage)  Patient/caregiver understands and will follow disposition?: Yes Reason for Disposition  [1] Systolic BP 90-110 AND [2] taking blood pressure medications AND [3] feeling weak or lightheaded  Answer Assessment - Initial Assessment Questions Patient was calling about chronic sciatica, but mentioned that her BP has been low and she has been dizzy. Noticed dizziness on 07/23/24 and it has been intermittent since. Patient denies higher acuity questions.  Patient was advised to go to UC or the ED for further evaluation as there is no same day OV available. Patient agrees and will have someone else drive her. Positional change precautions reviewed with the patient. Patient verbalized understanding.  1. BLOOD PRESSURE: What is your blood pressure? Did you take at least two measurements 5 minutes apart?     88/61, second is 96/66  2. ONSET: When did you take your blood pressure?     Took BP at 1220  3. HOW: How did you take your blood pressure? (e.g., visiting nurse, automatic home BP monitor)     Machine  4. PULSE RATE: Do you know what your pulse rate is?      87  5. OTHER SYMPTOMS: Have you been sick recently? Have you had a recent injury?     Dizziness  Protocols used: Blood Pressure - Low-A-AH Copied from CRM #[id number]. Topic: Clinical - Red Word Triage >> Jul 27, 2024 12:12 PM Lavanda D wrote: Red Word that prompted transfer to Nurse Triage: Extreme nerve pain/fever (99.8 today, yesterday 100.5) and she said that she feels lightheaded as well. Pain has been going on since surgery in march.

## 2024-07-30 NOTE — Telephone Encounter (Signed)
 Please call and confirm how she is doing. Need f/u regarding symptoms, blood pressure, etc.

## 2024-07-30 NOTE — Telephone Encounter (Signed)
 Spoke with pt. Pt stated that she didn't get evaluated. Pt monitored her bp over the weekend. Pt stated that her right hip and leg is hurting that she does have an appointment with Dr. Avanell to discuss. Pt stated she received an injection on Wednesday that was supposed to help with her pain. Pt denied fever, but is experiencing  night sweats. Pt's current BP was 137/77 and yesterday's reading was 102/66

## 2024-07-30 NOTE — Telephone Encounter (Signed)
 Per review, it appears she is doing some better, but If persistent problems, needs to be evaluated.

## 2024-07-30 NOTE — Telephone Encounter (Signed)
 Patient is still having the leg pain. She is going to call [facility] for earlier appt.   Patient would also like to f/u on neurology referral that was placed on 8/13.

## 2024-08-01 ENCOUNTER — Other Ambulatory Visit: Payer: Self-pay

## 2024-08-01 DIAGNOSIS — H539 Unspecified visual disturbance: Secondary | ICD-10-CM

## 2024-08-01 DIAGNOSIS — H532 Diplopia: Secondary | ICD-10-CM

## 2024-08-01 NOTE — Addendum Note (Signed)
 Addended by: LEARTA PORTO D on: 08/01/2024 08:19 AM   Modules accepted: Orders

## 2024-08-01 NOTE — Telephone Encounter (Signed)
 New referral placed.

## 2024-08-02 ENCOUNTER — Other Ambulatory Visit: Payer: Self-pay | Admitting: Physical Medicine and Rehabilitation

## 2024-08-02 DIAGNOSIS — M7918 Myalgia, other site: Secondary | ICD-10-CM | POA: Diagnosis not present

## 2024-08-02 DIAGNOSIS — M5416 Radiculopathy, lumbar region: Secondary | ICD-10-CM

## 2024-08-02 DIAGNOSIS — M5126 Other intervertebral disc displacement, lumbar region: Secondary | ICD-10-CM | POA: Diagnosis not present

## 2024-08-02 DIAGNOSIS — M48062 Spinal stenosis, lumbar region with neurogenic claudication: Secondary | ICD-10-CM | POA: Diagnosis not present

## 2024-08-04 ENCOUNTER — Ambulatory Visit
Admission: RE | Admit: 2024-08-04 | Discharge: 2024-08-04 | Disposition: A | Discharge status: Home / Self Care | Source: Ambulatory Visit | Attending: Physical Medicine and Rehabilitation | Admitting: Physical Medicine and Rehabilitation

## 2024-08-04 DIAGNOSIS — M4317 Spondylolisthesis, lumbosacral region: Secondary | ICD-10-CM | POA: Diagnosis not present

## 2024-08-04 DIAGNOSIS — M4727 Other spondylosis with radiculopathy, lumbosacral region: Secondary | ICD-10-CM | POA: Diagnosis not present

## 2024-08-04 DIAGNOSIS — M5117 Intervertebral disc disorders with radiculopathy, lumbosacral region: Secondary | ICD-10-CM | POA: Diagnosis not present

## 2024-08-04 DIAGNOSIS — M5416 Radiculopathy, lumbar region: Secondary | ICD-10-CM

## 2024-08-08 ENCOUNTER — Other Ambulatory Visit (HOSPITAL_COMMUNITY): Payer: Self-pay

## 2024-08-08 ENCOUNTER — Other Ambulatory Visit: Payer: Self-pay

## 2024-08-09 ENCOUNTER — Other Ambulatory Visit (HOSPITAL_COMMUNITY): Payer: Self-pay

## 2024-08-10 ENCOUNTER — Other Ambulatory Visit (HOSPITAL_COMMUNITY): Payer: Self-pay

## 2024-08-10 ENCOUNTER — Telehealth: Payer: Self-pay

## 2024-08-10 NOTE — Telephone Encounter (Signed)
 Pharmacy Patient Advocate Encounter   Received notification from Onbase that prior authorization for Praluent  75MG /ML auto-injectors is required/requested.   Insurance verification completed.   The patient is insured through [facility] ADVANTAGE/RX ADVANCE .   Per test claim: PA required; PA submitted to above mentioned insurance via Latent Key/confirmation #/EOC [id number] Status is pending

## 2024-08-13 ENCOUNTER — Other Ambulatory Visit (HOSPITAL_COMMUNITY): Payer: Self-pay

## 2024-08-13 NOTE — Telephone Encounter (Signed)
 Patient is aware.

## 2024-08-13 NOTE — Telephone Encounter (Signed)
 Pharmacy Patient Advocate Encounter  Received notification from [facility] ADVANTAGE/RX ADVANCE that Prior Authorization for  Praluent  75MG /ML auto-injectors  has been APPROVED from 08/10/24 to 08/10/25   PA #/Case ID/Reference #: [id number]

## 2024-08-21 DIAGNOSIS — Z6829 Body mass index (BMI) 29.0-29.9, adult: Secondary | ICD-10-CM | POA: Diagnosis not present

## 2024-08-21 DIAGNOSIS — M4807 Spinal stenosis, lumbosacral region: Secondary | ICD-10-CM | POA: Diagnosis not present

## 2024-08-22 ENCOUNTER — Other Ambulatory Visit: Payer: Self-pay | Admitting: Internal Medicine

## 2024-08-22 DIAGNOSIS — M5126 Other intervertebral disc displacement, lumbar region: Secondary | ICD-10-CM | POA: Diagnosis not present

## 2024-08-22 DIAGNOSIS — M5416 Radiculopathy, lumbar region: Secondary | ICD-10-CM | POA: Diagnosis not present

## 2024-08-24 ENCOUNTER — Telehealth: Payer: Self-pay | Admitting: Internal Medicine

## 2024-08-24 ENCOUNTER — Other Ambulatory Visit: Payer: Self-pay

## 2024-08-24 ENCOUNTER — Other Ambulatory Visit (HOSPITAL_COMMUNITY): Payer: Self-pay

## 2024-08-24 ENCOUNTER — Other Ambulatory Visit: Payer: Self-pay | Admitting: Internal Medicine

## 2024-08-24 DIAGNOSIS — E7800 Pure hypercholesterolemia, unspecified: Secondary | ICD-10-CM

## 2024-08-24 MED ORDER — METOPROLOL SUCCINATE ER 25 MG PO TB24: 25.0000 mg | ORAL_TABLET | Freq: Every day | ORAL | 0 refills | Status: DC

## 2024-08-24 MED ORDER — PRALUENT 75 MG/ML ~~LOC~~ SOAJ
75.0000 mg | SUBCUTANEOUS | 3 refills | Status: AC
  Filled 2024-08-24: qty 6, 84d supply, fill #0
  Filled 2024-11-20: qty 6, 84d supply, fill #1

## 2024-08-24 NOTE — Telephone Encounter (Unsigned)
 Copied from CRM [id number]. Topic: Clinical - Medication Refill >> Aug 24, 2024  1:46 PM Tanazia G wrote: Medication: Alirocumab  (PRALUENT ) 75 MG/ML  Has the patient contacted their pharmacy? Yes (Agent: If no, request that the patient contact the pharmacy for the refill. If patient does not wish to contact the pharmacy document the reason why and proceed with request.) (Agent: If yes, when and what did the pharmacy advise?)  This is the patient's preferred pharmacy:   [facility] [address] [place] [address] Phone: [phone] Fax: [phone]  Is this the correct pharmacy for this prescription? Yes If no, delete pharmacy and type the correct one.   Has the prescription been filled recently? Yes  Is the patient out of the medication? Yes  Has the patient been seen for an appointment in the last year OR does the patient have an upcoming appointment? Yes  Can we respond through MyChart? Yes  Agent: Please be advised that Rx refills may take up to 3 business days. We ask that you follow-up with your pharmacy.

## 2024-08-24 NOTE — Telephone Encounter (Signed)
 Garmon, Tanazia M to E2c2 Nurse Triage Pool - Primary Care (Selected Message) TG    08/24/24  1:48 PM Patient is completely out of her medication and is requesting an urgent refill before the weekend.

## 2024-08-24 NOTE — Telephone Encounter (Signed)
 Refill sent, patient aware.

## 2024-08-29 DIAGNOSIS — M5441 Lumbago with sciatica, right side: Secondary | ICD-10-CM | POA: Diagnosis not present

## 2024-08-29 DIAGNOSIS — G8929 Other chronic pain: Secondary | ICD-10-CM | POA: Diagnosis not present

## 2024-09-05 ENCOUNTER — Other Ambulatory Visit

## 2024-09-06 ENCOUNTER — Other Ambulatory Visit (INDEPENDENT_AMBULATORY_CARE_PROVIDER_SITE_OTHER)

## 2024-09-06 DIAGNOSIS — R739 Hyperglycemia, unspecified: Secondary | ICD-10-CM | POA: Diagnosis not present

## 2024-09-06 DIAGNOSIS — M5441 Lumbago with sciatica, right side: Secondary | ICD-10-CM | POA: Diagnosis not present

## 2024-09-06 DIAGNOSIS — E7800 Pure hypercholesterolemia, unspecified: Secondary | ICD-10-CM

## 2024-09-06 DIAGNOSIS — G8929 Other chronic pain: Secondary | ICD-10-CM | POA: Diagnosis not present

## 2024-09-06 NOTE — Addendum Note (Signed)
 Addended by: MARYLEN PRO A on: 09/06/2024 12:31 PM   Modules accepted: Orders

## 2024-09-07 ENCOUNTER — Ambulatory Visit: Admitting: Internal Medicine

## 2024-09-07 ENCOUNTER — Encounter: Payer: Self-pay | Admitting: Internal Medicine

## 2024-09-07 VITALS — BP 118/70 | HR 80 | Resp 16 | Ht 65.0 in | Wt 178.0 lb

## 2024-09-07 DIAGNOSIS — R8290 Unspecified abnormal findings in urine: Secondary | ICD-10-CM | POA: Diagnosis not present

## 2024-09-07 DIAGNOSIS — E538 Deficiency of other specified B group vitamins: Secondary | ICD-10-CM

## 2024-09-07 DIAGNOSIS — I10 Essential (primary) hypertension: Secondary | ICD-10-CM | POA: Diagnosis not present

## 2024-09-07 DIAGNOSIS — F331 Major depressive disorder, recurrent, moderate: Secondary | ICD-10-CM

## 2024-09-07 DIAGNOSIS — I700 Atherosclerosis of aorta: Secondary | ICD-10-CM

## 2024-09-07 DIAGNOSIS — H532 Diplopia: Secondary | ICD-10-CM | POA: Diagnosis not present

## 2024-09-07 DIAGNOSIS — Z23 Encounter for immunization: Secondary | ICD-10-CM | POA: Diagnosis not present

## 2024-09-07 DIAGNOSIS — K219 Gastro-esophageal reflux disease without esophagitis: Secondary | ICD-10-CM

## 2024-09-07 DIAGNOSIS — R739 Hyperglycemia, unspecified: Secondary | ICD-10-CM | POA: Diagnosis not present

## 2024-09-07 DIAGNOSIS — M48062 Spinal stenosis, lumbar region with neurogenic claudication: Secondary | ICD-10-CM

## 2024-09-07 DIAGNOSIS — Z86711 Personal history of pulmonary embolism: Secondary | ICD-10-CM

## 2024-09-07 DIAGNOSIS — E7800 Pure hypercholesterolemia, unspecified: Secondary | ICD-10-CM | POA: Diagnosis not present

## 2024-09-07 DIAGNOSIS — Z860100 Personal history of colon polyps, unspecified: Secondary | ICD-10-CM

## 2024-09-07 LAB — LIPID PANEL
Chol/HDL Ratio: 4.8 ratio — ABNORMAL HIGH (ref 0.0–4.4)
Cholesterol, Total: 248 mg/dL — ABNORMAL HIGH (ref 100–199)
HDL: 52 mg/dL (ref >39)
LDL Chol Calc (NIH): 168 mg/dL — ABNORMAL HIGH (ref 0–99)
Triglycerides: 156 mg/dL — ABNORMAL HIGH (ref 0–149)
VLDL Cholesterol Cal: 28 mg/dL (ref 5–40)

## 2024-09-07 LAB — BASIC METABOLIC PANEL WITH GFR
BUN/Creatinine Ratio: 19 (ref 12–28)
BUN: 22 mg/dL (ref 8–27)
CO2: 22 mmol/L (ref 20–29)
Calcium: 9.5 mg/dL (ref 8.7–10.3)
Chloride: 100 mmol/L (ref 96–106)
Creatinine, Ser: 1.18 mg/dL — ABNORMAL HIGH (ref 0.57–1.00)
Glucose: 82 mg/dL (ref 70–99)
Potassium: 4.2 mmol/L (ref 3.5–5.2)
Sodium: 137 mmol/L (ref 134–144)
eGFR: 50 mL/min/1.73 — ABNORMAL LOW (ref >59)

## 2024-09-07 LAB — HEMOGLOBIN A1C
Est. average glucose Bld gHb Est-mCnc: 128 mg/dL
Hgb A1c MFr Bld: 6.1 % — ABNORMAL HIGH (ref 4.8–5.6)

## 2024-09-07 LAB — HEPATIC FUNCTION PANEL
ALT: 7 IU/L (ref 0–32)
AST: 17 IU/L (ref 0–40)
Albumin: 4.1 g/dL (ref 3.9–4.9)
Alkaline Phosphatase: 124 IU/L (ref 49–135)
Bilirubin Total: 0.3 mg/dL (ref 0.0–1.2)
Bilirubin, Direct: 0.09 mg/dL (ref 0.00–0.40)
Total Protein: 6.7 g/dL (ref 6.0–8.5)

## 2024-09-07 MED ORDER — CYANOCOBALAMIN 1000 MCG/ML IJ SOLN
1000.0000 ug | Freq: Once | INTRAMUSCULAR | Status: AC
  Administered 2024-09-07: 1000 ug via INTRAMUSCULAR

## 2024-09-07 MED ORDER — EZETIMIBE 10 MG PO TABS: ORAL_TABLET | ORAL | 1 refills | Status: DC

## 2024-09-07 NOTE — Progress Notes (Signed)
 Subjective:    Patient ID: Angel Riddle, female    DOB: [birth year], 68 y.o.   MRN: [id number]  Patient here for  Chief Complaint  Patient presents with   Medical Management of Chronic Issues    HPI Here for a scheduled follow up - Follow up regarding hypertension, hypercholesterolemia and increased stress. S/p L1-L2 decompression and fusion surgery 3//5/35. Two months after the surgery noticed right sided pleuritic chest pain. CTA - PE. Recommended eliquis  for 6 months. Per note, planning to come off 10/2024. Need to confirm with hematology. Seeing Dr Melanee. Had f/u with ortho 04/24/24 - right thumb CMC OA - injection. Saw Dr Avanell 04/16/24 - continue tylenol , norco and lyrica . ESI - L2-3. Continues on cymbalta  and prozac . Had f/u with Dr Avanell 07/12/24 - continue tylenol , norxo and lyrica . Was given prednisone . S/p ESI (L2-3). Still with increased pain. Pain into right calf/knee. Has also been using heat and ice. Scheduled for another injection 10/2024. Called Dr Mavis office yesterday. Apparently does not recommend surgery. Has been having issues with double vision. Saw ophthalmology. Need records. Have requested. Continues to occur intermittently. If covers one eye - resolves. No pain. Has appt with neurology 10/2024. No chest pain reported. Breathing stable. Urine is cloudy and has an odor. Discussed checking to confirm no infection.    Past Medical History:  Diagnosis Date   Allergy    Anal fissure    Anxiety    a.) on BZO (alprazolam ) PRN   Aortic atherosclerosis    Arthritis    B12 deficiency    Basal cell carcinoma (BCC) of skin of nose    Chronic back pain    Degenerative disc disease, lumbar    Depression    DOE (dyspnea on exertion)    Dysrhythmia    Sinus Tachycardia - on Metoprolol    Female bladder prolapse    Gallstones    GERD (gastroesophageal reflux disease)    Headache    Hx of migraines, but none in a long time (as of 2025)   Heart murmur    Hx of colonic  polyp    Hx: UTI (urinary tract infection)    Hypercholesterolemia    Hypertension    Long term current use of aspirin     Lumbar stenosis    Panic attacks    Wears partial dentures    Past Surgical History:  Procedure Laterality Date   ABDOMINAL HYSTERECTOMY  1992   partial   CARPOMETACARPAL (CMC) FUSION OF THUMB Left 05/25/2023   Procedure: SUSPENSION ARTHROPLASTY OF LEFT THUMB CMC JOINT;  Surgeon: Edie Norleen PARAS, MD;  Location: [facility];  Service: Orthopedics;  Laterality: Left;   CATARACT EXTRACTION Bilateral [id number]   CHOLECYSTECTOMY  04/19/2014   COLONOSCOPY  07/02/2011   DR. Byrnett   COLONOSCOPY WITH PROPOFOL  N/A 09/25/2015   Procedure: COLONOSCOPY WITH PROPOFOL ;  Surgeon: Rogelia Copping, MD;  Location: [facility];  Service: Endoscopy;  Laterality: N/A;   FOOT SURGERY Right 2009   LUMBAR FUSION  2018   MOUTH SURGERY     dental implants, veneers   POLYPECTOMY  09/25/2015   Procedure: POLYPECTOMY;  Surgeon: Rogelia Copping, MD;  Location: [facility];  Service: Endoscopy;;   skin surgery Left 08/27/2013   Basal cell removal-left nostril   TONSILLECTOMY AND ADENOIDECTOMY  1969   Family History  Problem Relation Age of Onset   Hyperlipidemia Mother    Hypertension Mother    Alcohol abuse Father    Hyperlipidemia  Father    Heart disease Father    Diabetes Father    Hyperlipidemia Sister    Lung cancer Brother    Hyperlipidemia Brother    Breast cancer Neg Hx    Social History   Socioeconomic History   Marital status: Widowed    Spouse name: Not on file   Number of children: 2   Years of education: Not on file   Highest education level: Some college, no degree  Occupational History   Not on file  Tobacco Use   Smoking status: Every Day    Current packs/day: 1.00    Average packs/day: 1 pack/day for 48.0 years (48.0 ttl pk-yrs)    Types: Cigarettes    Start date: 2013    Last attempt to quit: 1975   Smokeless tobacco: Never   Tobacco comments:     Smokes 1 PPD  Vaping Use   Vaping status: Never Used  Substance and Sexual Activity   Alcohol use: Yes    Comment: Rarely   Drug use: No    Comment: Delta 9 Gummies - in the evening   Sexual activity: Not Currently    Birth control/protection: None  Other Topics Concern   Not on file  Social History Narrative   Lives alone   Social Drivers of Health   Financial Resource Strain: Low Risk  (09/02/2024)   Received from [facility]   Overall Financial Resource Strain (CARDIA)    Difficulty of Paying Living Expenses: Not very hard  Food Insecurity: No Food Insecurity (09/02/2024)   Received from [facility]   Hunger Vital Sign    Within the past 12 months, you worried that your food would run out before you got the money to buy more.: Never true    Within the past 12 months, the food you bought just didn't last and you didn't have money to get more.: Never true  Transportation Needs: No Transportation Needs (09/02/2024)   Received from [facility] - Transportation    In the past 12 months, has lack of transportation kept you from medical appointments or from getting medications?: No    Lack of Transportation (Non-Medical): No  Physical Activity: Unknown (06/23/2023)   Exercise Vital Sign    Days of Exercise per Week: 0 days    Minutes of Exercise per Session: Not on file  Stress: No Stress Concern Present (06/23/2023)   [employer] of Occupational [employer] - Occupational Stress Questionnaire    Feeling of Stress : Only a little  Social Connections: Unknown (06/23/2023)   Social Connection and Isolation Panel    Frequency of Communication with Friends and Family: More than three times a week    Frequency of Social Gatherings with Friends and Family: Three times a week    Attends Religious Services: More than 4 times per year    Active Member of Clubs or Organizations: Not on file    Attends Banker  Meetings: Not on file    Marital Status: Widowed     Review of Systems  Constitutional:  Negative for appetite change and fever.  HENT:  Negative for congestion and sinus pressure.   Respiratory:  Negative for cough and chest tightness.        Breathing stable. No increased sob.   Cardiovascular:  Negative for chest pain and palpitations.       No increased swelling.   Gastrointestinal:  Negative for abdominal pain,  diarrhea, nausea and vomiting.  Genitourinary:  Negative for difficulty urinating and dysuria.       Cloudy urine. Bad odor - urine.   Musculoskeletal:        Back pain and leg pain as outlined.   Skin:  Negative for color change and rash.  Neurological:  Negative for dizziness and headaches.  Psychiatric/Behavioral:  Negative for agitation and dysphoric mood.        Objective:     BP 118/70   Pulse 80   Resp 16   Ht 5' 5 (1.651 m)   Wt 178 lb (80.7 kg)   SpO2 98%   BMI 29.62 kg/m  Wt Readings from Last 3 Encounters:  09/08/24 175 lb (79.4 kg)  09/07/24 178 lb (80.7 kg)  05/30/24 183 lb 4 oz (83.1 kg)    Physical Exam Vitals reviewed.  Constitutional:      General: She is not in acute distress.    Appearance: Normal appearance.  HENT:     Head: Normocephalic and atraumatic.     Right Ear: External ear normal.     Left Ear: External ear normal.     Mouth/Throat:     Pharynx: No oropharyngeal exudate or posterior oropharyngeal erythema.  Eyes:     General: No scleral icterus.       Right eye: No discharge.        Left eye: No discharge.     Conjunctiva/sclera: Conjunctivae normal.  Neck:     Thyroid : No thyromegaly.  Cardiovascular:     Rate and Rhythm: Normal rate and regular rhythm.  Pulmonary:     Effort: No respiratory distress.     Breath sounds: Normal breath sounds. No wheezing.  Abdominal:     General: Bowel sounds are normal.     Palpations: Abdomen is soft.     Tenderness: There is no abdominal tenderness.  Musculoskeletal:         General: No swelling or tenderness.     Cervical back: Neck supple. No tenderness.  Lymphadenopathy:     Cervical: No cervical adenopathy.  Skin:    Findings: No erythema or rash.  Neurological:     Mental Status: She is alert.  Psychiatric:        Mood and Affect: Mood normal.        Behavior: Behavior normal.         Outpatient Encounter Medications as of 09/07/2024  Medication Sig   ezetimibe  (ZETIA ) 10 MG tablet Take one tablet Monday, Wednesday and Friday.   albuterol  (VENTOLIN  HFA) 108 (90 Base) MCG/ACT inhaler Inhale 2 puffs into the lungs every 6 (six) hours as needed for wheezing or shortness of breath.   Alirocumab  (PRALUENT ) 75 MG/ML SOAJ Inject 1 pen (75 mg) into the skin every 14 days.   ALPRAZolam  (XANAX ) 0.5 MG tablet Take 1 tablet (0.5 mg total) by mouth daily as needed for anxiety.   apixaban  (ELIQUIS ) 5 MG TABS tablet Take 1 tablet (5 mg total) by mouth 2 (two) times daily.   FLUoxetine  (PROZAC ) 40 MG capsule Take 1 capsule (40 mg total) by mouth daily.   HYDROcodone -acetaminophen  (NORCO) 7.5-325 MG tablet Take by mouth.   methocarbamol  (ROBAXIN ) 500 MG tablet Take 1 tablet (500 mg total) by mouth every 8 (eight) hours as needed.   pantoprazole  (PROTONIX ) 40 MG tablet Take 1 tablet (40 mg total) by mouth daily.   potassium chloride  (KLOR-CON ) 10 MEQ tablet Take 1 tablet (10 mEq total) by mouth  daily.   pregabalin  (LYRICA ) 75 MG capsule Take 150 mg by mouth daily.   triamterene -hydrochlorothiazide  (MAXZIDE -25) 37.5-25 MG tablet 1/2 tablet per day   [DISCONTINUED] metoprolol  succinate (TOPROL -XL) 25 MG 24 hr tablet Take 1 tablet (25 mg total) by mouth daily. Take with or immediately following a meal.   [DISCONTINUED] oxyCODONE -acetaminophen  (PERCOCET) 7.5-325 MG tablet Take 1 tablet by mouth.   [DISCONTINUED] predniSONE  (DELTASONE ) 10 MG tablet Take 4 tablets x 1 day and then decrease by 1/2 tablet per day until down to zero mg.   [EXPIRED] cyanocobalamin  (VITAMIN  B12) injection 1,000 mcg    No facility-administered encounter medications on file as of 09/07/2024.     Lab Results  Component Value Date   WBC 7.4 09/08/2024   HGB 13.0 09/08/2024   HCT 38.5 09/08/2024   PLT 273 09/08/2024   GLUCOSE 94 09/08/2024   CHOL 248 (H) 09/06/2024   TRIG 156 (H) 09/06/2024   HDL 52 09/06/2024   LDLDIRECT 139.0 05/04/2024   LDLCALC 168 (H) 09/06/2024   ALT 11 09/08/2024   AST 25 09/08/2024   NA 137 09/08/2024   K 3.3 (L) 09/08/2024   CL 102 09/08/2024   CREATININE 0.82 09/08/2024   BUN 16 09/08/2024   CO2 22 09/08/2024   TSH 0.72 11/15/2023   HGBA1C 6.1 (H) 09/06/2024    MR LUMBAR SPINE WO CONTRAST Result Date: 08/04/2024 CLINICAL DATA:  Initial evaluation for lower back pain with radiation into the right lower extremity and foot for 1 week. Prior fusion in March of 2025. EXAM: MRI LUMBAR SPINE WITHOUT CONTRAST TECHNIQUE: Multiplanar, multisequence MR imaging of the lumbar spine was performed. No intravenous contrast was administered. COMPARISON:  Prior MRI from 03/08/2023. FINDINGS: Segmentation: Standard. Lowest well-formed disc space labeled the L5-S1 level. Alignment: Mild dextroscoliosis. Straightening with slight reversal of the normal lumbar lordosis. Trace facet mediated anterolisthesis of L5 on S1. Appearance is stable. Vertebrae: Susceptibility artifact related to prior PLIF at L1-2 through L4-5, new at the L1-2 and L2-3 level since previous. Vertebral body height grossly maintained without acute or chronic fracture. Bone marrow signal intensity within normal limits. No worrisome osseous lesions. Degenerative reactive endplate change with marrow edema present about the L5-S1 interspace, greater on the right. Conus medullaris and cauda equina: Conus extends to the L2 level. Conus and cauda equina appear normal. Paraspinal and other soft tissues: Chronic postoperative scarring present within the posterior paraspinous soft tissues. Subcentimeter T2  hyperintense simple right renal cyst, benign in appearance, no follow-up imaging recommended. Disc levels: T12-L1: Negative interspace.  Mild facet hypertrophy.  No stenosis. L1-2:  Prior PLIF.  No residual canal or foraminal stenosis. L2-3: Mild disc bulge with disc desiccation. Prior posterior fusion. Moderate facet hypertrophy. Mild narrowing of the left lateral recess with mild left L2 foraminal stenosis. Right neural foramina remains patent. Central canal remains widely patent. L3-4: Prior PLIF. No residual spinal stenosis. Foramina appear patent. L4-5: Prior PLIF. No residual spinal stenosis. Foramina appear patent. L5-S1: Trace anterolisthesis. Disc desiccation. Broad-based central to right subarticular disc protrusion with superior migration (series 106, images 33, 32). Protruding disc closely approximates and/or impinges upon both the exiting right L5 or descending S1 nerve roots, either which could be affected. Moderate facet and ligament flavum hypertrophy with associated trace joint effusions. Resultant mild canal with moderate left and severe right lateral recess stenosis. Moderate right worse than left L5 foraminal narrowing. IMPRESSION: 1. Broad-based central to right subarticular disc protrusion with superior migration at L5-S1, potentially  affecting either the right L5 or descending S1 nerve roots. Reactive endplate change with marrow edema at this level could also contribute to right lower extremity symptoms. 2. Prior PLIF at L1-2 through L4-5 without significant residual or recurrent spinal stenosis. 3. Mild disc bulge with facet hypertrophy at L2-3 with resultant mild left lateral recess and left L2 foraminal stenosis. Electronically Signed   By: Morene Hoard M.D.   On: 08/04/2024 20:08       Assessment & Plan:  Bad odor of urine Assessment & Plan: Check urine to confirm no infection.   Orders: -     Urinalysis, Routine w reflex microscopic -     Urine Culture  Immunization  due -     Flu vaccine HIGH DOSE PF(Fluzone Trivalent)  B12 deficiency Assessment & Plan: Continue B12 injections.   Orders: -     Cyanocobalamin   Lumbar stenosis with neurogenic claudication Assessment & Plan: Has seen Dr Mavis. Continue f/u with Dr Avanell. Continues on tylenol , norco prn and lyrica .  Continues on cymbalta  and prozac . Had f/u with Dr Avanell 07/12/24 - continue tylenol , norxo and lyrica . Was given prednisone . S/p ESI (L2-3). Still with increased pain. Pain into right calf/knee. Has also been using heat and ice. Scheduled for another injection 10/2024. Called Dr Mavis office yesterday. Apparently does not recommend surgery.    Aortic atherosclerosis Assessment & Plan: Continue praluent .    Depression, major, recurrent, moderate (HCC) Assessment & Plan: Continue cymbalta  and prozac . Overall appears to be stable.    Double vision Assessment & Plan: Reports persistent intermittent episodes of double vision for years. In reviewing, it appears had w/up in 2022 for intermittent episodes of double vision. MRI 04/2021 - Mild chronic white matter ischemic changes. No acute abnormality. Carotid ultrasound 04/2021 - no stenosis. Evaluated by ophthalmology then. She is on eliquis . Blood pressure controlled. Recently reevaluated by ophthalmology. Have requested records. Continues treatment for cholesterol. Resolves with covering one eye. Has appt with neurology next month.     Gastroesophageal reflux disease, unspecified whether esophagitis present Assessment & Plan: No upper symptoms reported. Continue protonix .    History of colonic polyps Assessment & Plan: Saw Dr Dessa.  Recommended holding colonoscopy until 2025-2026.     History of pulmonary embolus (PE) Assessment & Plan: Recent diagnosis of PE. CT scan as outlined. No sob. Pain improved/resolved. Taking eliquis . Will contact cardiology regarding echo - to confirm no right heart strain. Continue eliquis .  Continues f/u with Dr Melanee.    Hypercholesterolemia Assessment & Plan: Continue praluent . Continue low cholesterol diet and exercise. Follow lipid panel.  Discussed goal LDL. Had intolerance to statin medication. Discussed treatment options. Add zetia .  Lab Results  Component Value Date   CHOL 248 (H) 09/06/2024   HDL 52 09/06/2024   LDLCALC 168 (H) 09/06/2024   LDLDIRECT 139.0 05/04/2024   TRIG 156 (H) 09/06/2024   CHOLHDL 4.8 (H) 09/06/2024      Hyperglycemia Assessment & Plan: Low carb diet and exercise.  Follow met b and A1c.  Lab Results  Component Value Date   HGBA1C 6.1 (H) 09/06/2024     Primary hypertension Assessment & Plan: Continue metoprolol . On triam/hydrochlorothiazide . Blood as outlined. Follow pressures.  No changes in medication. Follow metabolic panel.    Other orders -     Ezetimibe ; Take one tablet Monday, Wednesday and Friday.  Dispense: 39 tablet; Refill: 1 -     Microscopic Examination     Allena Hamilton, MD

## 2024-09-08 ENCOUNTER — Other Ambulatory Visit: Payer: Self-pay

## 2024-09-08 ENCOUNTER — Emergency Department
Admission: EM | Admit: 2024-09-08 | Discharge: 2024-09-08 | Disposition: A | Discharge status: Home / Self Care | Attending: Emergency Medicine | Admitting: Emergency Medicine

## 2024-09-08 DIAGNOSIS — M549 Dorsalgia, unspecified: Secondary | ICD-10-CM | POA: Diagnosis not present

## 2024-09-08 DIAGNOSIS — Z7901 Long term (current) use of anticoagulants: Secondary | ICD-10-CM | POA: Insufficient documentation

## 2024-09-08 DIAGNOSIS — M461 Sacroiliitis, not elsewhere classified: Secondary | ICD-10-CM | POA: Insufficient documentation

## 2024-09-08 DIAGNOSIS — M5126 Other intervertebral disc displacement, lumbar region: Secondary | ICD-10-CM | POA: Diagnosis not present

## 2024-09-08 DIAGNOSIS — G8929 Other chronic pain: Secondary | ICD-10-CM

## 2024-09-08 DIAGNOSIS — R350 Frequency of micturition: Secondary | ICD-10-CM | POA: Insufficient documentation

## 2024-09-08 DIAGNOSIS — M545 Low back pain, unspecified: Secondary | ICD-10-CM | POA: Diagnosis present

## 2024-09-08 DIAGNOSIS — N3090 Cystitis, unspecified without hematuria: Secondary | ICD-10-CM | POA: Diagnosis not present

## 2024-09-08 LAB — COMPREHENSIVE METABOLIC PANEL WITH GFR
ALT: 11 U/L (ref 0–44)
AST: 25 U/L (ref 15–41)
Albumin: 3.4 g/dL — ABNORMAL LOW (ref 3.5–5.0)
Alkaline Phosphatase: 86 U/L (ref 38–126)
Anion gap: 13 (ref 5–15)
BUN: 16 mg/dL (ref 8–23)
CO2: 22 mmol/L (ref 22–32)
Calcium: 9 mg/dL (ref 8.9–10.3)
Chloride: 102 mmol/L (ref 98–111)
Creatinine, Ser: 0.82 mg/dL (ref 0.44–1.00)
GFR, Estimated: >60 mL/min (ref >60)
Glucose, Bld: 94 mg/dL (ref 70–99)
Potassium: 3.3 mmol/L — ABNORMAL LOW (ref 3.5–5.1)
Sodium: 137 mmol/L (ref 135–145)
Total Bilirubin: 1 mg/dL (ref 0.0–1.2)
Total Protein: 6.9 g/dL (ref 6.5–8.1)

## 2024-09-08 LAB — CBC WITH DIFFERENTIAL/PLATELET
Abs Immature Granulocytes: 0.02 K/uL (ref 0.00–0.07)
Basophils Absolute: 0 K/uL (ref 0.0–0.1)
Basophils Relative: 1 %
Eosinophils Absolute: 0 K/uL (ref 0.0–0.5)
Eosinophils Relative: 0 %
HCT: 38.5 % (ref 36.0–46.0)
Hemoglobin: 13 g/dL (ref 12.0–15.0)
Immature Granulocytes: 0 %
Lymphocytes Relative: 14 %
Lymphs Abs: 1 K/uL (ref 0.7–4.0)
MCH: 28.8 pg (ref 26.0–34.0)
MCHC: 33.8 g/dL (ref 30.0–36.0)
MCV: 85.2 fL (ref 80.0–100.0)
Monocytes Absolute: 0.4 K/uL (ref 0.1–1.0)
Monocytes Relative: 6 %
Neutro Abs: 5.9 K/uL (ref 1.7–7.7)
Neutrophils Relative %: 79 %
Platelets: 273 K/uL (ref 150–400)
RBC: 4.52 MIL/uL (ref 3.87–5.11)
RDW: 13.5 % (ref 11.5–15.5)
WBC: 7.4 K/uL (ref 4.0–10.5)
nRBC: 0 % (ref 0.0–0.2)

## 2024-09-08 LAB — URINALYSIS, ROUTINE W REFLEX MICROSCOPIC
Bilirubin Urine: NEGATIVE
Bilirubin, UA: NEGATIVE
Glucose, UA: NEGATIVE
Glucose, UA: NEGATIVE mg/dL
Hgb urine dipstick: NEGATIVE
Ketones, UA: NEGATIVE
Ketones, ur: NEGATIVE mg/dL
Nitrite, UA: POSITIVE — AB
Nitrite: POSITIVE — AB
Protein, ur: NEGATIVE mg/dL
Protein,UA: NEGATIVE
RBC, UA: NEGATIVE
Specific Gravity, UA: 1.018 (ref 1.005–1.030)
Specific Gravity, Urine: 1.013 (ref 1.005–1.030)
Urobilinogen, Ur: 0.2 mg/dL (ref 0.2–1.0)
WBC, UA: >50 WBC/hpf (ref 0–5)
pH, UA: 6 (ref 5.0–7.5)
pH: 7 (ref 5.0–8.0)

## 2024-09-08 LAB — MICROSCOPIC EXAMINATION
Casts: NONE SEEN /LPF
Epithelial Cells (non renal): >10 /HPF — AB (ref 0–10)

## 2024-09-08 LAB — LIPASE, BLOOD: Lipase: 34 U/L (ref 11–51)

## 2024-09-08 MED ORDER — HYDROCODONE-ACETAMINOPHEN 10-325 MG PO TABS: 2.0000 | ORAL_TABLET | Freq: Four times a day (QID) | ORAL | 0 refills | Status: AC | PRN

## 2024-09-08 MED ORDER — ACETAMINOPHEN 500 MG PO TABS: 1000.0000 mg | ORAL_TABLET | Freq: Once | ORAL | Status: DC

## 2024-09-08 MED ORDER — HYDROMORPHONE HCL 1 MG/ML IJ SOLN
1.0000 mg | Freq: Once | INTRAMUSCULAR | Status: AC
  Administered 2024-09-08: 1 mg via INTRAVENOUS
  Filled 2024-09-08: qty 1

## 2024-09-08 MED ORDER — SODIUM CHLORIDE 0.9 % IV SOLN
1.0000 g | INTRAVENOUS | Status: AC
  Administered 2024-09-08: 1 g via INTRAVENOUS
  Filled 2024-09-08: qty 10

## 2024-09-08 MED ORDER — SODIUM CHLORIDE 0.9 % IV BOLUS
1000.0000 mL | Freq: Once | INTRAVENOUS | Status: AC
  Administered 2024-09-08: 1000 mL via INTRAVENOUS

## 2024-09-08 MED ORDER — LIDOCAINE 5 % EX PTCH
1.0000 | MEDICATED_PATCH | CUTANEOUS | Status: DC
  Administered 2024-09-08: 1 via TRANSDERMAL
  Filled 2024-09-08: qty 1

## 2024-09-08 MED ORDER — CYCLOBENZAPRINE HCL 10 MG PO TABS: 10.0000 mg | ORAL_TABLET | Freq: Three times a day (TID) | ORAL | 0 refills | Status: DC | PRN

## 2024-09-08 MED ORDER — PREDNISONE 10 MG (21) PO TBPK: ORAL_TABLET | ORAL | 0 refills | Status: DC

## 2024-09-08 MED ORDER — DEXAMETHASONE SODIUM PHOSPHATE 10 MG/ML IJ SOLN
10.0000 mg | Freq: Once | INTRAMUSCULAR | Status: AC
  Administered 2024-09-08: 10 mg via INTRAVENOUS
  Filled 2024-09-08: qty 1

## 2024-09-08 MED ORDER — ONDANSETRON HCL 4 MG/2ML IJ SOLN
4.0000 mg | Freq: Once | INTRAMUSCULAR | Status: AC
  Administered 2024-09-08: 4 mg via INTRAVENOUS
  Filled 2024-09-08: qty 2

## 2024-09-08 MED ORDER — HYDROCODONE-ACETAMINOPHEN 10-300 MG PO TABS: 2.0000 | ORAL_TABLET | Freq: Four times a day (QID) | ORAL | 0 refills | Status: DC | PRN

## 2024-09-08 MED ORDER — CYCLOBENZAPRINE HCL 10 MG PO TABS
5.0000 mg | ORAL_TABLET | Freq: Once | ORAL | Status: AC
  Administered 2024-09-08: 5 mg via ORAL
  Filled 2024-09-08: qty 1

## 2024-09-08 MED ORDER — CEPHALEXIN 500 MG PO CAPS: 500.0000 mg | ORAL_CAPSULE | Freq: Three times a day (TID) | ORAL | 0 refills | Status: DC

## 2024-09-08 MED ORDER — HYDROCODONE-ACETAMINOPHEN 10-325 MG PO TABS
2.0000 | ORAL_TABLET | ORAL | Status: AC
  Administered 2024-09-08: 2 via ORAL
  Filled 2024-09-08: qty 2

## 2024-09-08 NOTE — ED Notes (Signed)
 First nurse note:  TO ED [facility] for lower back pain. Hx L5 herniation, spinal fusion to L1 in March. Pain radiating down R leg. Almost out of muscle relaxers and has been taking double dose hydrocodone  (15mg ) last dose 430am.  VSS.

## 2024-09-08 NOTE — ED Provider Notes (Addendum)
 [facility] Provider Note    Event Date/Time   First MD Initiated Contact with Patient 09/08/24 1243     (approximate)   History   Back Pain   HPI  Angel Riddle is a 68 y.o. female who comes in by EMS for lower back pain.  Patient has a history of an L5 herniation spinal fusion to L1 in March.  Patient is on Eliquis .  she is on hydrocodone  7.5 with Tylenol  325  Pt with chronic pain in right SI joint.  She reports pain radiates down the back of the right leg.  She has had pain like this before but this is more severe.  She reports that she has not been talking to her spinal doctors who told her that they cannot offer any surgeries until after she is off Eliquis .  Patient is on Eliquis  for history of blood clots.  She denies any new falls hitting her head, hitting her back.  She denies any pain in her flank area or pain in her abdomen area.  She points to her right gluteus when asking where her pain is and then states that shoots down the back of her leg.  She reports that she has been on the hydrocodone  7.5 but not having any improvement with the medications and she needs higher doses.  She does have Flexeril  as well but recently ran out.  She denies any issues with urinary or rectal incontinence, saddle anesthesia.  US  in 2017 no aneurysm.    Physical Exam   Triage Vital Signs: ED Triage Vitals  Encounter Vitals Group     BP 09/08/24 1226 95/76     Girls Systolic BP Percentile --      Girls Diastolic BP Percentile --      Boys Systolic BP Percentile --      Boys Diastolic BP Percentile --      Pulse Rate 09/08/24 1226 81     Resp --      Temp 09/08/24 1226 98.6 F (37 C)     Temp Source 09/08/24 1226 Oral     SpO2 09/08/24 1226 100 %     Weight 09/08/24 1227 175 lb (79.4 kg)     Height 09/08/24 1227 5' 5 (1.651 m)     Head Circumference --      Peak Flow --      Pain Score 09/08/24 1241 10     Pain Loc --      Pain Education --       Exclude from Growth Chart --     Most recent vital signs: Vitals:   09/08/24 1226  BP: 95/76  Pulse: 81  Temp: 98.6 F (37 C)  SpO2: 100%     General: Awake, no distress.  CV:  Good peripheral perfusion.  Resp:  Normal effort.  Abd:  No distention.  Soft and nontender Other:  No flank tenderness.  She has got pain on her right SI joint with palpation.  No rash noted.  Good distal pulses in the feet.  Sensation is intact.  Able to flex and extend the legs with good strength.  No saddle anesthesia.  Rectal exam is normal with good tone.   ED Results / Procedures / Treatments   Labs (all labs ordered are listed, but only abnormal results are displayed) Labs Reviewed  COMPREHENSIVE METABOLIC PANEL WITH GFR - Abnormal; Notable for the following components:      Result Value  Potassium 3.3 (*)    Albumin  3.4 (*)    All other components within normal limits  CBC WITH DIFFERENTIAL/PLATELET  LIPASE, BLOOD  URINALYSIS, ROUTINE W REFLEX MICROSCOPIC     PROCEDURES:  Critical Care performed: No  Procedures   MEDICATIONS ORDERED IN ED: Medications  lidocaine  (LIDODERM ) 5 % 1 patch (1 patch Transdermal Patch Applied 09/08/24 1437)  HYDROmorphone  (DILAUDID ) injection 1 mg (1 mg Intravenous Given 09/08/24 1300)  ondansetron  (ZOFRAN ) injection 4 mg (4 mg Intravenous Given 09/08/24 1300)  cyclobenzaprine  (FLEXERIL ) tablet 5 mg (5 mg Oral Given 09/08/24 1437)  dexamethasone  (DECADRON ) injection 10 mg (10 mg Intravenous Given 09/08/24 1437)  HYDROmorphone  (DILAUDID ) injection 1 mg (1 mg Intravenous Given 09/08/24 1430)  sodium chloride  0.9 % bolus 1,000 mL (0 mLs Intravenous Stopped 09/08/24 1545)     IMPRESSION / MDM / ASSESSMENT AND PLAN / ED COURSE  I reviewed the triage vital signs and the nursing notes.   Patient's presentation is most consistent with acute presentation with potential threat to life or bodily function.   Patient comes in with right SI joint pain radiating down  the back of her right leg.  Initially was some soft blood pressure and so I considered the possibility of AAA rupture, kidney stone but she has got no pain in her abdomen or in her back.  The pain is in her SI joint and she has had pain in similar locations before and is being worked up outpatient.  She had an MRI that I reviewed on 08/04/2024 where patient has known disc herniation with impingement on the right L5 possible S1 nerve roots which I suspect is consistent with patient's pain today.  She denies any urinary or rectal incontinence is no evidence of cord compression based upon examination her biggest concern is her pain.  Patient was given some doses of pain medications including Dilaudid  and she had improvement in pain but still unable to ambulate.  Patient was given a second dose of pain medication, Decadron , fluids.  Patient reports that she is on Flexeril  10 mg as needed she reports that this is not had better improvement of symptoms over methocarbamol .  Will get postvoid bladder scan make get a urine to make sure no signs of UTI and do ambulation trial.  Patient be handed off pending these results to decide on further management.    FINAL CLINICAL IMPRESSION(S) / ED DIAGNOSES   Final diagnoses:  Chronic right SI joint pain  Lumbar herniated disc     Rx / DC Orders   ED Discharge Orders          Ordered    predniSONE  (STERAPRED UNI-PAK 21 TAB) 10 MG (21) TBPK tablet        09/08/24 1448    cyclobenzaprine  (FLEXERIL ) 10 MG tablet  3 times daily PRN        09/08/24 1448             Note:  This document was prepared using Dragon voice recognition software and may include unintentional dictation errors.   Ernest Ronal BRAVO, MD 09/08/24 1449    Ernest Ronal BRAVO, MD 09/08/24 1621    Ernest Ronal BRAVO, MD 09/08/24 1630

## 2024-09-08 NOTE — Discharge Instructions (Addendum)
 Taking the Flexeril  with hdyrocodone can be very sedating and can lead to increased risk of fall so do not take these at the same time.  The hydrocodone  that you have also has Tylenol  in it so make sure you do not take more than 3000 mg of Tylenol  in a day  You should take the steroids and continue the lidocaine  patches and follow-up with your team for your back to decide on next steps given your continued discomfort.  Return to the ER for fevers, weakness, numbness, tingling

## 2024-09-08 NOTE — ED Notes (Signed)
 Assisted pt to the side toilet in the room to collect a urine sample. Pt is back in bed with call bell by bedside and connected back to the cardiac monitor. Will send urine to lab at this time.

## 2024-09-08 NOTE — ED Notes (Signed)
 This bladder scan was done post residual after giving urine sample.

## 2024-09-08 NOTE — ED Notes (Signed)
 Fall bundle is in place

## 2024-09-08 NOTE — ED Triage Notes (Signed)
 Pt to ED via EMS c/o right hip pain that radiates down right leg. States she has an herniated disc - was told by Dr Mavis (neurologist) earlier this week. Was told By Dr Mavis that he couldn't do anything until she was able to come off of blood thinner. Pt is on blood thinner d/t blood clot in right lung. Was also seen by pain management last week; given injection. States the relief from injection didn't last long. Rates pain 10//10.

## 2024-09-08 NOTE — ED Notes (Signed)
 Bed alarm is not under pt at this time, pt states it hurts to move, informed pt to use call bell by bedside if she needed to get out of bed so staff could assist pt as she is a high fall risk. Pt understood at this time. Pt has shoes on, fall risk bracelet applied and call bell by bedside.

## 2024-09-08 NOTE — ED Notes (Signed)
 Ambulated pt to the door in the room and back to pt bed, pt tolerated this well.

## 2024-09-08 NOTE — ED Provider Notes (Signed)
 Procedures     ----------------------------------------- 7:15 PM on 09/08/2024 -----------------------------------------  Labs consistent with UTI.  On further discussion with the patient she does endorse urinary frequency over the last few days. She also notes that she has been taking her hydrocodone  7.5 mg tablets 2 at a time over the past year for a usual dose of 15 mg.  Still having substantial pain a few hours after IV Dilaudid .  However, with a dose of 20 mg oral hydrocodone , she was able to ambulate around the room and felt more comfortable and functional.  Stable for discharge at this time.  I think that her increased pain which does appear to be peripheral radiculopathy is related to inflammatory process to cystitis.  Not septic.    Viviann Pastor, MD 09/08/24 [phone]

## 2024-09-09 ENCOUNTER — Encounter: Payer: Self-pay | Admitting: Internal Medicine

## 2024-09-09 ENCOUNTER — Ambulatory Visit: Payer: Self-pay | Admitting: Internal Medicine

## 2024-09-09 NOTE — Assessment & Plan Note (Signed)
 Reports persistent intermittent episodes of double vision for years. In reviewing, it appears had w/up in 2022 for intermittent episodes of double vision. MRI 04/2021 - Mild chronic white matter ischemic changes. No acute abnormality. Carotid ultrasound 04/2021 - no stenosis. Evaluated by ophthalmology then. She is on eliquis . Blood pressure controlled. Recently reevaluated by ophthalmology. Have requested records. Continues treatment for cholesterol. Resolves with covering one eye. Has appt with neurology next month.

## 2024-09-09 NOTE — Assessment & Plan Note (Signed)
 Continue metoprolol . On triam/hydrochlorothiazide . Blood as outlined. Follow pressures.  No changes in medication. Follow metabolic panel.

## 2024-09-09 NOTE — Assessment & Plan Note (Signed)
 Low carb diet and exercise.  Follow met b and A1c.  Lab Results  Component Value Date   HGBA1C 6.1 (H) 09/06/2024

## 2024-09-09 NOTE — Assessment & Plan Note (Signed)
 Has seen Dr Mavis. Continue f/u with Dr Avanell. Continues on tylenol , norco prn and lyrica .  Continues on cymbalta  and prozac . Had f/u with Dr Avanell 07/12/24 - continue tylenol , norxo and lyrica . Was given prednisone . S/p ESI (L2-3). Still with increased pain. Pain into right calf/knee. Has also been using heat and ice. Scheduled for another injection 10/2024. Called Dr Mavis office yesterday. Apparently does not recommend surgery.

## 2024-09-09 NOTE — Assessment & Plan Note (Signed)
 Continue praluent . Continue low cholesterol diet and exercise. Follow lipid panel.  Discussed goal LDL. Had intolerance to statin medication. Discussed treatment options. Add zetia .  Lab Results  Component Value Date   CHOL 248 (H) 09/06/2024   HDL 52 09/06/2024   LDLCALC 168 (H) 09/06/2024   LDLDIRECT 139.0 05/04/2024   TRIG 156 (H) 09/06/2024   CHOLHDL 4.8 (H) 09/06/2024

## 2024-09-09 NOTE — Assessment & Plan Note (Signed)
Check urine to confirm no infection.  

## 2024-09-09 NOTE — Assessment & Plan Note (Signed)
 Continue cymbalta  and prozac . Overall appears to be stable.

## 2024-09-09 NOTE — Assessment & Plan Note (Signed)
 Continue B12 injections.

## 2024-09-09 NOTE — Assessment & Plan Note (Signed)
 No upper symptoms reported.  Continue protonix.

## 2024-09-09 NOTE — Assessment & Plan Note (Signed)
 Continue praluent.

## 2024-09-09 NOTE — Assessment & Plan Note (Signed)
Saw Dr Bary Castilla.  Recommended holding colonoscopy until 2025-2026.

## 2024-09-09 NOTE — Assessment & Plan Note (Signed)
 Recent diagnosis of PE. CT scan as outlined. No sob. Pain improved/resolved. Taking eliquis . Will contact cardiology regarding echo - to confirm no right heart strain. Continue eliquis . Continues f/u with Dr Melanee.

## 2024-09-10 LAB — URINE CULTURE: Culture: >=100000 — AB

## 2024-09-11 LAB — URINE CULTURE

## 2024-09-13 ENCOUNTER — Inpatient Hospital Stay
Admission: EM | Admit: 2024-09-13 | Discharge: 2024-09-25 | DRG: 552 | Disposition: A | Discharge status: Skilled Nursing Facility | Attending: Osteopathic Medicine | Admitting: Osteopathic Medicine

## 2024-09-13 ENCOUNTER — Inpatient Hospital Stay

## 2024-09-13 ENCOUNTER — Other Ambulatory Visit: Payer: Self-pay

## 2024-09-13 ENCOUNTER — Encounter: Payer: Self-pay | Admitting: Emergency Medicine

## 2024-09-13 DIAGNOSIS — F411 Generalized anxiety disorder: Secondary | ICD-10-CM | POA: Diagnosis present

## 2024-09-13 DIAGNOSIS — F331 Major depressive disorder, recurrent, moderate: Secondary | ICD-10-CM | POA: Diagnosis present

## 2024-09-13 DIAGNOSIS — Z801 Family history of malignant neoplasm of trachea, bronchus and lung: Secondary | ICD-10-CM

## 2024-09-13 DIAGNOSIS — Z83438 Family history of other disorder of lipoprotein metabolism and other lipidemia: Secondary | ICD-10-CM

## 2024-09-13 DIAGNOSIS — I878 Other specified disorders of veins: Secondary | ICD-10-CM | POA: Diagnosis not present

## 2024-09-13 DIAGNOSIS — G600 Hereditary motor and sensory neuropathy: Secondary | ICD-10-CM | POA: Diagnosis not present

## 2024-09-13 DIAGNOSIS — M5117 Intervertebral disc disorders with radiculopathy, lumbosacral region: Secondary | ICD-10-CM | POA: Diagnosis not present

## 2024-09-13 DIAGNOSIS — Z79899 Other long term (current) drug therapy: Secondary | ICD-10-CM

## 2024-09-13 DIAGNOSIS — Z91041 Radiographic dye allergy status: Secondary | ICD-10-CM

## 2024-09-13 DIAGNOSIS — Z8249 Family history of ischemic heart disease and other diseases of the circulatory system: Secondary | ICD-10-CM

## 2024-09-13 DIAGNOSIS — K219 Gastro-esophageal reflux disease without esophagitis: Secondary | ICD-10-CM | POA: Diagnosis not present

## 2024-09-13 DIAGNOSIS — Z91048 Other nonmedicinal substance allergy status: Secondary | ICD-10-CM

## 2024-09-13 DIAGNOSIS — Z833 Family history of diabetes mellitus: Secondary | ICD-10-CM

## 2024-09-13 DIAGNOSIS — M1611 Unilateral primary osteoarthritis, right hip: Secondary | ICD-10-CM | POA: Diagnosis not present

## 2024-09-13 DIAGNOSIS — Z7901 Long term (current) use of anticoagulants: Secondary | ICD-10-CM

## 2024-09-13 DIAGNOSIS — E785 Hyperlipidemia, unspecified: Secondary | ICD-10-CM | POA: Diagnosis present

## 2024-09-13 DIAGNOSIS — M549 Dorsalgia, unspecified: Secondary | ICD-10-CM | POA: Diagnosis not present

## 2024-09-13 DIAGNOSIS — M79604 Pain in right leg: Secondary | ICD-10-CM | POA: Diagnosis present

## 2024-09-13 DIAGNOSIS — Z86711 Personal history of pulmonary embolism: Secondary | ICD-10-CM

## 2024-09-13 DIAGNOSIS — G4700 Insomnia, unspecified: Secondary | ICD-10-CM | POA: Diagnosis not present

## 2024-09-13 DIAGNOSIS — G8929 Other chronic pain: Secondary | ICD-10-CM | POA: Diagnosis not present

## 2024-09-13 DIAGNOSIS — S99921A Unspecified injury of right foot, initial encounter: Secondary | ICD-10-CM | POA: Diagnosis not present

## 2024-09-13 DIAGNOSIS — Z6828 Body mass index (BMI) 28.0-28.9, adult: Secondary | ICD-10-CM

## 2024-09-13 DIAGNOSIS — Z981 Arthrodesis status: Secondary | ICD-10-CM

## 2024-09-13 DIAGNOSIS — I10 Essential (primary) hypertension: Secondary | ICD-10-CM | POA: Diagnosis not present

## 2024-09-13 DIAGNOSIS — F17210 Nicotine dependence, cigarettes, uncomplicated: Secondary | ICD-10-CM | POA: Diagnosis present

## 2024-09-13 DIAGNOSIS — M5136 Other intervertebral disc degeneration, lumbar region with discogenic back pain only: Secondary | ICD-10-CM | POA: Diagnosis not present

## 2024-09-13 DIAGNOSIS — R52 Pain, unspecified: Secondary | ICD-10-CM

## 2024-09-13 DIAGNOSIS — Z811 Family history of alcohol abuse and dependence: Secondary | ICD-10-CM | POA: Diagnosis not present

## 2024-09-13 DIAGNOSIS — Z7982 Long term (current) use of aspirin: Secondary | ICD-10-CM | POA: Diagnosis not present

## 2024-09-13 DIAGNOSIS — M48061 Spinal stenosis, lumbar region without neurogenic claudication: Secondary | ICD-10-CM | POA: Diagnosis not present

## 2024-09-13 DIAGNOSIS — E875 Hyperkalemia: Secondary | ICD-10-CM | POA: Diagnosis not present

## 2024-09-13 DIAGNOSIS — F17200 Nicotine dependence, unspecified, uncomplicated: Secondary | ICD-10-CM | POA: Diagnosis not present

## 2024-09-13 DIAGNOSIS — Z85828 Personal history of other malignant neoplasm of skin: Secondary | ICD-10-CM

## 2024-09-13 DIAGNOSIS — M4727 Other spondylosis with radiculopathy, lumbosacral region: Secondary | ICD-10-CM | POA: Diagnosis present

## 2024-09-13 DIAGNOSIS — Z90710 Acquired absence of both cervix and uterus: Secondary | ICD-10-CM

## 2024-09-13 DIAGNOSIS — M5416 Radiculopathy, lumbar region: Secondary | ICD-10-CM | POA: Diagnosis not present

## 2024-09-13 DIAGNOSIS — Z66 Do not resuscitate: Secondary | ICD-10-CM | POA: Diagnosis not present

## 2024-09-13 DIAGNOSIS — M62830 Muscle spasm of back: Secondary | ICD-10-CM | POA: Diagnosis not present

## 2024-09-13 DIAGNOSIS — E7800 Pure hypercholesterolemia, unspecified: Secondary | ICD-10-CM | POA: Diagnosis not present

## 2024-09-13 DIAGNOSIS — Z881 Allergy status to other antibiotic agents status: Secondary | ICD-10-CM

## 2024-09-13 DIAGNOSIS — E663 Overweight: Secondary | ICD-10-CM | POA: Diagnosis present

## 2024-09-13 DIAGNOSIS — Z888 Allergy status to other drugs, medicaments and biological substances status: Secondary | ICD-10-CM

## 2024-09-13 DIAGNOSIS — M479 Spondylosis, unspecified: Secondary | ICD-10-CM | POA: Diagnosis not present

## 2024-09-13 DIAGNOSIS — M5126 Other intervertebral disc displacement, lumbar region: Secondary | ICD-10-CM

## 2024-09-13 DIAGNOSIS — M545 Low back pain, unspecified: Secondary | ICD-10-CM | POA: Diagnosis not present

## 2024-09-13 LAB — PROTIME-INR
INR: 1 (ref 0.8–1.2)
Prothrombin Time: 14.2 s (ref 11.4–15.2)

## 2024-09-13 LAB — CBC
HCT: 38.4 % (ref 36.0–46.0)
Hemoglobin: 13.2 g/dL (ref 12.0–15.0)
MCH: 29.3 pg (ref 26.0–34.0)
MCHC: 34.4 g/dL (ref 30.0–36.0)
MCV: 85.3 fL (ref 80.0–100.0)
Platelets: 314 K/uL (ref 150–400)
RBC: 4.5 MIL/uL (ref 3.87–5.11)
RDW: 13.5 % (ref 11.5–15.5)
WBC: 9.7 K/uL (ref 4.0–10.5)
nRBC: 0 % (ref 0.0–0.2)

## 2024-09-13 LAB — BASIC METABOLIC PANEL WITH GFR
Anion gap: 11 (ref 5–15)
BUN: 19 mg/dL (ref 8–23)
CO2: 25 mmol/L (ref 22–32)
Calcium: 9.2 mg/dL (ref 8.9–10.3)
Chloride: 102 mmol/L (ref 98–111)
Creatinine, Ser: 0.85 mg/dL (ref 0.44–1.00)
GFR, Estimated: >60 mL/min (ref >60)
Glucose, Bld: 150 mg/dL — ABNORMAL HIGH (ref 70–99)
Potassium: 3.3 mmol/L — ABNORMAL LOW (ref 3.5–5.1)
Sodium: 138 mmol/L (ref 135–145)

## 2024-09-13 MED ORDER — OXYCODONE HCL 5 MG PO TABS: 10.0000 mg | ORAL_TABLET | ORAL | Status: DC | PRN

## 2024-09-13 MED ORDER — PANTOPRAZOLE SODIUM 40 MG PO TBEC
40.0000 mg | DELAYED_RELEASE_TABLET | Freq: Every day | ORAL | Status: DC
  Administered 2024-09-13 – 2024-09-24 (×12): 40 mg via ORAL
  Filled 2024-09-13 (×12): qty 1

## 2024-09-13 MED ORDER — DEXAMETHASONE SODIUM PHOSPHATE 10 MG/ML IJ SOLN
10.0000 mg | Freq: Once | INTRAMUSCULAR | Status: AC
  Administered 2024-09-13: 10 mg via INTRAVENOUS

## 2024-09-13 MED ORDER — OXYCODONE HCL 5 MG PO TABS
10.0000 mg | ORAL_TABLET | ORAL | Status: DC
  Filled 2024-09-13 (×2): qty 2

## 2024-09-13 MED ORDER — HYDROMORPHONE HCL 1 MG/ML IJ SOLN
1.0000 mg | Freq: Once | INTRAMUSCULAR | Status: AC
  Administered 2024-09-13: 1 mg via INTRAVENOUS
  Filled 2024-09-13: qty 1

## 2024-09-13 MED ORDER — KETOROLAC TROMETHAMINE 15 MG/ML IJ SOLN
15.0000 mg | Freq: Three times a day (TID) | INTRAMUSCULAR | Status: DC | PRN
Start: 2024-09-13 — End: 2024-09-17
  Administered 2024-09-14: 15 mg via INTRAVENOUS
  Filled 2024-09-13: qty 1

## 2024-09-13 MED ORDER — KETOROLAC TROMETHAMINE 15 MG/ML IJ SOLN
15.0000 mg | Freq: Once | INTRAMUSCULAR | Status: AC
  Administered 2024-09-13: 15 mg via INTRAVENOUS
  Filled 2024-09-13: qty 1

## 2024-09-13 MED ORDER — APIXABAN 5 MG PO TABS
5.0000 mg | ORAL_TABLET | Freq: Two times a day (BID) | ORAL | Status: DC
  Administered 2024-09-13 – 2024-09-17 (×8): 5 mg via ORAL
  Filled 2024-09-13 (×9): qty 1

## 2024-09-13 MED ORDER — HYDROMORPHONE HCL 1 MG/ML IJ SOLN
2.0000 mg | INTRAMUSCULAR | Status: AC | PRN
  Administered 2024-09-13 – 2024-09-14 (×2): 2 mg via INTRAVENOUS
  Filled 2024-09-13 (×2): qty 2

## 2024-09-13 MED ORDER — POTASSIUM CHLORIDE ER 10 MEQ PO TBCR
10.0000 meq | EXTENDED_RELEASE_TABLET | Freq: Every day | ORAL | Status: DC
  Administered 2024-09-14: 10 meq via ORAL
  Filled 2024-09-13 (×2): qty 1

## 2024-09-13 MED ORDER — SENNOSIDES-DOCUSATE SODIUM 8.6-50 MG PO TABS
1.0000 | ORAL_TABLET | Freq: Every evening | ORAL | Status: DC | PRN
Start: 2024-09-13 — End: 2024-09-14

## 2024-09-13 MED ORDER — NICOTINE 14 MG/24HR TD PT24
14.0000 mg | MEDICATED_PATCH | Freq: Every day | TRANSDERMAL | Status: DC
  Administered 2024-09-15: 14 mg via TRANSDERMAL
  Filled 2024-09-13 (×7): qty 1

## 2024-09-13 MED ORDER — PREGABALIN 75 MG PO CAPS
150.0000 mg | ORAL_CAPSULE | Freq: Two times a day (BID) | ORAL | Status: DC
  Administered 2024-09-13 – 2024-09-15 (×5): 150 mg via ORAL
  Filled 2024-09-13 (×5): qty 2

## 2024-09-13 MED ORDER — METHOCARBAMOL 1000 MG/10ML IJ SOLN
500.0000 mg | Freq: Four times a day (QID) | INTRAMUSCULAR | Status: DC
  Administered 2024-09-13 – 2024-09-18 (×18): 500 mg via INTRAVENOUS
  Filled 2024-09-13 (×10): qty 10
  Filled 2024-09-13: qty 5
  Filled 2024-09-13 (×6): qty 10
  Filled 2024-09-13: qty 5
  Filled 2024-09-13 (×2): qty 10

## 2024-09-13 MED ORDER — LIDOCAINE 5 % EX PTCH
1.0000 | MEDICATED_PATCH | CUTANEOUS | Status: DC
  Administered 2024-09-13 – 2024-09-18 (×6): 1 via TRANSDERMAL
  Filled 2024-09-13 (×6): qty 1

## 2024-09-13 MED ORDER — ACETAMINOPHEN 500 MG PO TABS
1000.0000 mg | ORAL_TABLET | Freq: Four times a day (QID) | ORAL | Status: DC
  Administered 2024-09-13 – 2024-09-15 (×6): 1000 mg via ORAL
  Filled 2024-09-13 (×7): qty 2

## 2024-09-13 MED ORDER — FLUOXETINE HCL 20 MG PO CAPS
40.0000 mg | ORAL_CAPSULE | Freq: Every day | ORAL | Status: DC
  Administered 2024-09-14 – 2024-09-25 (×12): 40 mg via ORAL
  Filled 2024-09-13 (×13): qty 2

## 2024-09-13 MED ORDER — EZETIMIBE 10 MG PO TABS
10.0000 mg | ORAL_TABLET | Freq: Every day | ORAL | Status: DC
Start: 2024-09-13 — End: 2024-09-16
  Administered 2024-09-14 – 2024-09-15 (×2): 10 mg via ORAL
  Filled 2024-09-13 (×4): qty 1

## 2024-09-13 NOTE — ED Notes (Signed)
 See triage note  Presents with lower back and right leg pain  States she has had some problems with her back   Also had surgery `in June  Currently on blood thinner   States her current pain med is not working

## 2024-09-13 NOTE — ED Provider Notes (Signed)
 [facility] Provider Note    Event Date/Time   First MD Initiated Contact with Patient 09/13/24 1036     (approximate)   History   Leg Pain   HPI  Angel Riddle is a 69 y.o. female with a past medical history of chronic back pain with known herniation and spinal fusion to L1 in March who presents today for evaluation of right leg pain.  Patient takes hydrocodone  7.5 mg tablets 2 at a time over the past year for usual dose of 15 mg.  She reports that her hydrocodone  is not touching her pain anymore because she has been on it for so long.  She reports that her pain feels is not the same as her previous exacerbations.  She reports that she cannot get surgery on her back until she is off of her Eliquis .  She denies any falls.  She reports that she has been using a walker to get around but is having increasing difficulty with this.  Family at bedside reports that she is unable to get around at all now.  They are concerned because she lives alone.  She has not had any burning with urination though she is still taking antibiotics for her UTI.  She denies urinary or fecal incontinence or retention.  Patient Active Problem List   Diagnosis Date Noted   Right leg pain 09/13/2024   Bad odor of urine 05/30/2024   Abdominal pain 05/07/2024   History of pulmonary embolus (PE) 04/13/2024   Spondylolisthesis of lumbar region 02/08/2024   Hematuria 11/01/2023   Lumbar foraminal stenosis 07/29/2023   Change in bowel movement 06/19/2023   Low back pain 05/08/2023   Knee pain 04/06/2023   Thumb pain 04/05/2023   Muscle ache 03/29/2023   Acute non-recurrent sinusitis 12/31/2022   B12 deficiency 09/26/2022   Tachycardia 04/06/2022   Dysuria 01/18/2022   Leg pain 05/13/2021   Double vision 03/14/2021   Female bladder prolapse 03/14/2021   Tinnitus 07/05/2020   Chronic left sacroiliac pain 05/21/2020   Chronic pain of left knee 05/21/2020   Bilateral hip pain  05/21/2020   Left elbow pain 05/21/2020   Fatigue 12/30/2019   Weight loss counseling, encounter for 12/30/2019   Breast cancer screening 07/07/2019   Aortic atherosclerosis 02/10/2019   GERD (gastroesophageal reflux disease) 06/20/2018   Chest pain 01/06/2018   Lumbar stenosis with neurogenic claudication 09/15/2017   BMI 31.0-31.9,adult 04/25/2017   Hyperglycemia 04/25/2017   Personal history of tobacco use, presenting hazards to health 12/15/2016   History of colonic polyps    Anal fissure    Benign neoplasm of sigmoid colon    Left arm pain 08/03/2015   Cough 07/30/2015   Health care maintenance 01/12/2015   Abdominal fullness 03/10/2014   Numbness and tingling 10/02/2013   Hypertension 10/02/2013   Chronic back pain 07/18/2013   Depression, major, recurrent, moderate (HCC) 07/18/2013   Anxiety 07/18/2013   Environmental allergies 07/18/2013   Hypercholesterolemia 07/18/2013   History of colonic polyps 07/18/2013   Allergy to environmental factors 07/18/2013          Physical Exam   Triage Vital Signs: ED Triage Vitals [09/13/24 1024]  Encounter Vitals Group     BP (!) 132/90     Girls Systolic BP Percentile      Girls Diastolic BP Percentile      Boys Systolic BP Percentile      Boys Diastolic BP Percentile  Pulse Rate 86     Resp 16     Temp 98.2 F (36.8 C)     Temp Source Oral     SpO2 95 %     Weight 174 lb 2.6 oz (79 kg)     Height 5' 5 (1.651 m)     Head Circumference      Peak Flow      Pain Score 10     Pain Loc      Pain Education      Exclude from Growth Chart     Most recent vital signs: Vitals:   09/13/24 1024 09/13/24 1411  BP: (!) 132/90 128/88  Pulse: 86 80  Resp: 16 16  Temp: 98.2 F (36.8 C) 98 F (36.7 C)  SpO2: 95% 96%    Physical Exam Vitals and nursing note reviewed.  Constitutional:      General: Awake and alert.  Very uncomfortable in appearance, grunting with any attempted movement on the bed.    Appearance:  Normal appearance.  HENT:     Head: Normocephalic and atraumatic.     Mouth: Mucous membranes are moist.  Eyes:     General: PERRL. Normal EOMs        Right eye: No discharge.        Left eye: No discharge.     Conjunctiva/sclera: Conjunctivae normal.  Cardiovascular:     Rate and Rhythm: Normal rate and regular rhythm.     Pulses: Normal pulses.  Pulmonary:     Effort: Pulmonary effort is normal. No respiratory distress.     Breath sounds: Normal breath sounds.  Abdominal:     Abdomen is soft. There is no abdominal tenderness. No rebound or guarding. No distention. Musculoskeletal:        General: No swelling. Normal range of motion.     Cervical back: Normal range of motion and neck supple.  Back: No midline tenderness. Strength intact to bilateral lower extremities, though subjective decrease sensation over posterior aspect of upper leg and lateral aspect of lower leg. Normal great toe extension against resistance. Normal sensation throughout feet.  Pain with SLR.  Did not attempt FABER test given pain Skin:    General: Skin is warm and dry.     Capillary Refill: Capillary refill takes less than 2 seconds.     Findings: No rash.  Neurological:     Mental Status: The patient is awake and alert.      ED Results / Procedures / Treatments   Labs (all labs ordered are listed, but only abnormal results are displayed) Labs Reviewed  PROTIME-INR  HIV ANTIBODY (ROUTINE TESTING W REFLEX)  BASIC METABOLIC PANEL WITH GFR  CBC  URINE DRUG SCREEN, QUALITATIVE ([facility] ONLY)     EKG     RADIOLOGY     PROCEDURES:  Critical Care performed:   Procedures   MEDICATIONS ORDERED IN ED: Medications  lidocaine  (LIDODERM ) 5 % 1 patch (1 patch Transdermal Patch Applied 09/13/24 1114)  senna-docusate (Senokot-S) tablet 1 tablet (has no administration in time range)  nicotine  (NICODERM CQ  - dosed in mg/24 hours) patch 14 mg (has no administration in time range)  HYDROmorphone   (DILAUDID ) injection 2 mg (2 mg Intravenous Given 09/13/24 1408)  methocarbamol  (ROBAXIN ) injection 500 mg (500 mg Intravenous Given 09/13/24 1437)  pregabalin  (LYRICA ) capsule 150 mg (150 mg Oral Given 09/13/24 1437)  oxyCODONE  (Oxy IR/ROXICODONE ) immediate release tablet 10 mg (has no administration in time range)  acetaminophen  (TYLENOL )  tablet 1,000 mg (1,000 mg Oral Given 09/13/24 1437)  HYDROmorphone  (DILAUDID ) injection 1 mg (1 mg Intravenous Given 09/13/24 1115)  dexamethasone  (DECADRON ) injection 10 mg (10 mg Intravenous Given 09/13/24 1114)  ketorolac  (TORADOL ) 15 MG/ML injection 15 mg (15 mg Intravenous Given 09/13/24 1114)     IMPRESSION / MDM / ASSESSMENT AND PLAN / ED COURSE  I reviewed the triage vital signs and the nursing notes.   Differential diagnosis includes, but is not limited to, acute on chronic pain, lumbar radiculopathy, disc herniation.  I reviewed the patient's chart.  Patient had a MRI of her lumbar spine on 08/04/2024 which reveals a broad-based central to right subarticular disc protrusion with superior migration at L5-S1 potentially affecting the right L5 or descending S1 nerve roots.  Patient was seen in the emergency department 5 days ago with the same complaints.  Patient has had ESI's done by Dr. Avanell with physiatry, most recently in September.  She has also done PT.  Patient is awake and alert, hemodynamically stable and afebrile.  She is normal great toe extension against resistance, subjective decrease sensation over her L5-S1 distribution.  She is able to move her legs, has not had any urinary or fecal incontinence or retention or saddle anesthesia to suggest cauda equina.  Patient was treated with Dilaudid , dexamethasone , Toradol , and a Lidoderm  patch without significant improvement of her pain.  Family is very concerned about her ability to care for herself at home and get around given that she lives alone and has had difficulty ambulating and has been  progressively worsening over the past couple of months.  This is her second visit this week.  I do not suspect any new etiology to her pain today, though I do suspect acute on chronic pain and given her dependence on narcotics she is requiring higher and higher doses without demonstrable effect.  Given her intractable pain despite multiple IV pain medications and her inability to ambulate I recommended admission to the hospitalist.  Family and patient are in agreement.  I consulted the hospitalist for admission.  Patient was accepted by Dr. Fernand.  Patient's presentation is most consistent with severe exacerbation of chronic illness.  Clinical Course as of 09/13/24 1445  Thu Sep 13, 2024  1218 Patient reassessed, continues to have significant pain.  Family at bedside is concerned about her ability to function at home and would like for her to be admitted [JP]    Clinical Course User Index [JP] Denham Mose E, PA-C     FINAL CLINICAL IMPRESSION(S) / ED DIAGNOSES   Final diagnoses:  Lumbar radiculopathy  Lumbar disc herniation  Pain     Rx / DC Orders   ED Discharge Orders     None        Note:  This document was prepared using Dragon voice recognition software and may include unintentional dictation errors.   Teyla Skidgel E, PA-C 09/13/24 1445    Viviann Pastor, MD 09/14/24 [phone]

## 2024-09-13 NOTE — H&P (Addendum)
 History and Physical    [name] FMW:[id number] DOB: [birth year] DOA: 09/13/2024  DOS: the patient was seen and examined on 09/13/2024  PCP: [name], MD   Patient coming from: Home  I have personally briefly reviewed patient's old medical records in [facility] and CareEverywhere  HPI:   Angel Riddle is a 68 y.o. year old female with past medical history of chronic back pain secondary to disc herniation, MDD, hypertension, generalized anxiety disorder, GERD presenting to the ED with worsening back pain and right leg pain.  This is patient's second visit to the ED for this concern.  She is getting this evaluated outpatient.  There is plan for surgical intervention but given patient's recent PE she is currently being anticoagulated and cannot undergo surgery at this time.  Her current outpatient pain regimen is Norco 7.5 mg - 325 mg.  This was escalated in her last ED visit but patient states she is still having breakthrough pain.  She is on multimodal pain regimen and states she is adherent to that.  [name] is concerned about patient's mobility and her safety given she lives alone.  She does not report any injuries or falls. States has been getting worse over the last 2 months. Last epidural injection about 3 weeks ago. This was second epidural.   Pt had back surgery in March 2025. First back surgery in 2018. Has been seeing Dr. Mavis since 2018. She was found to have 05/2024 and was started on [facility]. Last seen 08/21/24.   Increased Lyrica  to 150 mg every day. Was taking cyclobenzaprine  but not since last 2 days.   Plan for operative management in 12/2024.  ED Course: On arrival to to the ED, patient is hemodynamically stable.  She is having worsening pain in her back and right lower leg that is worse than her chronic pain.  Given she had a recent ED visit, [facility] was consulted for admission.  No lab work or imaging done in the ED.   Review of Systems: As  mentioned in the history of present illness. All other systems reviewed and are negative.    Past Medical History:  Diagnosis Date   Allergy    Anal fissure    Anxiety    a.) on BZO (alprazolam ) PRN   Aortic atherosclerosis    Arthritis    B12 deficiency    Basal cell carcinoma (BCC) of skin of nose    Chronic back pain    Degenerative disc disease, lumbar    Depression    DOE (dyspnea on exertion)    Dysrhythmia    Sinus Tachycardia - on Metoprolol    Female bladder prolapse    Gallstones    GERD (gastroesophageal reflux disease)    Headache    Hx of migraines, but none in a long time (as of 2025)   Heart murmur    Hx of colonic polyp    Hx: UTI (urinary tract infection)    Hypercholesterolemia    Hypertension    Long term current use of aspirin     Lumbar stenosis    Panic attacks    Wears partial dentures     Past Surgical History:  Procedure Laterality Date   ABDOMINAL HYSTERECTOMY  1992   partial   CARPOMETACARPAL (CMC) FUSION OF THUMB Left 05/25/2023   Procedure: SUSPENSION ARTHROPLASTY OF LEFT THUMB CMC JOINT;  Surgeon: Edie Norleen PARAS, MD;  Location: [facility] ORS;  Service: Orthopedics;  Laterality: Left;   CATARACT  EXTRACTION Bilateral 2010,2011   CHOLECYSTECTOMY  04/19/2014   COLONOSCOPY  07/02/2011   DR. Byrnett   COLONOSCOPY WITH PROPOFOL  N/A 09/25/2015   Procedure: COLONOSCOPY WITH PROPOFOL ;  Surgeon: Rogelia Copping, MD;  Location: [facility];  Service: Endoscopy;  Laterality: N/A;   FOOT SURGERY Right 2009   LUMBAR FUSION  2018   MOUTH SURGERY     dental implants, veneers   POLYPECTOMY  09/25/2015   Procedure: POLYPECTOMY;  Surgeon: Rogelia Copping, MD;  Location: [facility];  Service: Endoscopy;;   skin surgery Left 08/27/2013   Basal cell removal-left nostril   TONSILLECTOMY AND ADENOIDECTOMY  1969     Allergies  Allergen Reactions   Doxycycline Nausea And Vomiting, Nausea Only and Other (See Comments)    doxycycline   Gadolinium  Nausea And Vomiting   Gadolinium Derivatives Nausea And Vomiting   Iodinated Contrast Media Nausea And Vomiting, Other (See Comments) and Nausea Only    Eyes turned blood red  During a CT scan of the kidneys  During a CT scan of the kidneys    Eyes turned blood red  During a CT scan of the kidneys   Levofloxacin  Other (See Comments)    Muscle Pain  Muscle pain    Muscle Pain   Silicone Other (See Comments)    Pulls skin; anything that does not pull is recommended  Other reaction(s): Other (See Comments) Pulls skin; anything that does not pull is recommended    Pulls skin; anything that does not pull is recommended   Adhesive [Tape] Other (See Comments)    Pulls skin, anything that does not pull is recommended     Family History  Problem Relation Age of Onset   Hyperlipidemia Mother    Hypertension Mother    Alcohol abuse Father    Hyperlipidemia Father    Heart disease Father    Diabetes Father    Hyperlipidemia Sister    Lung cancer Brother    Hyperlipidemia Brother    Breast cancer Neg Hx     Prior to Admission medications   Medication Sig Start Date End Date Taking? Authorizing Provider  albuterol  (VENTOLIN  HFA) 108 (90 Base) MCG/ACT inhaler Inhale 2 puffs into the lungs every 6 (six) hours as needed for wheezing or shortness of breath. 05/30/24   [name], MD  Alirocumab  (PRALUENT ) 75 MG/ML SOAJ Inject 1 pen (75 mg) into the skin every 14 days. 08/24/24   [name], MD  ALPRAZolam  (XANAX ) 0.5 MG tablet Take 1 tablet (0.5 mg total) by mouth daily as needed for anxiety. 07/20/24   [name], MD  apixaban  (ELIQUIS ) 5 MG TABS tablet Take 1 tablet (5 mg total) by mouth 2 (two) times daily. 08/22/24   [name], MD  cephALEXin  (KEFLEX ) 500 MG capsule Take 1 capsule (500 mg total) by mouth 3 (three) times daily. 09/08/24   Viviann Pastor, MD  cyclobenzaprine  (FLEXERIL ) 10 MG tablet Take 1 tablet (10 mg total) by mouth 3 (three) times daily as  needed for muscle spasms. 09/08/24   Viviann Pastor, MD  ezetimibe  (ZETIA ) 10 MG tablet Take one tablet Monday, Wednesday and Friday. 09/07/24   [name], MD  FLUoxetine  (PROZAC ) 40 MG capsule Take 1 capsule (40 mg total) by mouth daily. 04/13/24   [name], MD  HYDROcodone -acetaminophen  (NORCO) 7.5-325 MG tablet Take by mouth. 04/13/24   [provider]  methocarbamol  (ROBAXIN ) 500 MG tablet Take 1 tablet (500 mg total) by mouth every 8 (eight)  hours as needed. 01/01/24   Malvina Alm DASEN, MD  pantoprazole  (PROTONIX ) 40 MG tablet Take 1 tablet (40 mg total) by mouth daily. 11/01/23   [name], MD  potassium chloride  (KLOR-CON ) 10 MEQ tablet Take 1 tablet (10 mEq total) by mouth daily. 04/13/24   [name], MD  predniSONE  (STERAPRED UNI-PAK 21 TAB) 10 MG (21) TBPK tablet Take as packing directs 09/08/24   Viviann Pastor, MD  pregabalin  (LYRICA ) 75 MG capsule Take 150 mg by mouth daily.    [provider]  triamterene -hydrochlorothiazide  (MAXZIDE -25) 37.5-25 MG tablet 1/2 tablet per day 03/22/24   [name], MD      reports that she has been smoking cigarettes. She started smoking about 12 years ago. She has a 48 pack-year smoking history. She has never used smokeless tobacco. She reports current alcohol use. She reports that she does not use drugs. Lives with by herself.   Tobacco- 1 ppd for 40 years.  EtOH- occasional wine use and liquor use with coffee. Illicit drug use- denies use.  IADLs/ADLs- can person independently at baseline    Physical Exam: Vitals:   09/13/24 1411 09/13/24 1437 09/13/24 1631 09/13/24 1935  BP: 128/88  (!) 111/55 117/75  Pulse: 80 76 70 82  Resp: 16 18 16 15   Temp: 98 F (36.7 C) 98.3 F (36.8 C)  97.7 F (36.5 C)  TempSrc:  Oral    SpO2: 96% 98% 92% 95%  Weight:      Height:         Gen: pleasant female in slight distress HENT: NCAT CV: RRR, good pulses in all extremities Resp: CTAB, no abnormal  sounds Abd:no TTP, bowel sounds present MSK: no asymmetry, TTP of right hip, no TTP of spine or paraspinal area Skin:no lesions noted on examined skin Neuro: alert and oriented x4 Psych: normal mood   Labs on Admission: I have personally reviewed following labs and imaging studies  CBC: Recent Labs  Lab 09/08/24 1303 09/13/24 1519  WBC 7.4 9.7  NEUTROABS 5.9  --   HGB 13.0 13.2  HCT 38.5 38.4  MCV 85.2 85.3  PLT 273 314   Basic Metabolic Panel: Recent Labs  Lab 09/08/24 1303 09/13/24 1519  NA 137 138  K 3.3* 3.3*  CL 102 102  CO2 22 25  GLUCOSE 94 150*  BUN 16 19  CREATININE 0.82 0.85  CALCIUM 9.0 9.2   GFR: Estimated Creatinine Clearance: 65.8 mL/min (by C-G formula based on SCr of 0.85 mg/dL). Liver Function Tests: Recent Labs  Lab 09/08/24 1303  AST 25  ALT 11  ALKPHOS 86  BILITOT 1.0  PROT 6.9  ALBUMIN  3.4*   Recent Labs  Lab 09/08/24 1303  LIPASE 34   No results for input(s): AMMONIA in the last 168 hours. Coagulation Profile: Recent Labs  Lab 09/13/24 1355  INR 1.0   Cardiac Enzymes: No results for input(s): CKTOTAL, CKMB, CKMBINDEX, TROPONINI, TROPONINIHS in the last 168 hours. BNP (last 3 results) No results for input(s): BNP in the last 8760 hours. HbA1C: No results for input(s): HGBA1C in the last 72 hours. CBG: No results for input(s): GLUCAP in the last 168 hours. Lipid Profile: No results for input(s): CHOL, HDL, LDLCALC, TRIG, CHOLHDL, LDLDIRECT in the last 72 hours. Thyroid  Function Tests: No results for input(s): TSH, T4TOTAL, FREET4, T3FREE, THYROIDAB in the last 72 hours. Anemia Panel: No results for input(s): VITAMINB12, FOLATE, FERRITIN, TIBC, IRON, RETICCTPCT in the last 72 hours. Urine analysis:  Component Value Date/Time   COLORURINE YELLOW (A) 09/08/2024 1303   APPEARANCEUR CLOUDY (A) 09/08/2024 1303   APPEARANCEUR Clear 09/07/2024 1442   LABSPEC 1.013  09/08/2024 1303   LABSPEC 1.008 01/02/2012 1820   PHURINE 7.0 09/08/2024 1303   GLUCOSEU NEGATIVE 09/08/2024 1303   GLUCOSEU NEGATIVE 05/30/2024 1444   HGBUR NEGATIVE 09/08/2024 1303   BILIRUBINUR NEGATIVE 09/08/2024 1303   BILIRUBINUR Negative 09/07/2024 1442   BILIRUBINUR Negative 01/02/2012 1820   KETONESUR NEGATIVE 09/08/2024 1303   PROTEINUR NEGATIVE 09/08/2024 1303   UROBILINOGEN 0.2 05/30/2024 1444   NITRITE POSITIVE (A) 09/08/2024 1303   LEUKOCYTESUR MODERATE (A) 09/08/2024 1303   LEUKOCYTESUR Negative 01/02/2012 1820    Radiological Exams on Admission: I have personally reviewed images DG HIP UNILAT WITH PELVIS 2-3 VIEWS RIGHT Result Date: 09/13/2024 CLINICAL DATA:  Right leg pain. Pain radiates from hip into the foot. Injury EXAM: DG HIP (WITH OR WITHOUT PELVIS) 2-3V RIGHT COMPARISON:  05/22/2019 FINDINGS: The hip joint spaces are preserved. There is minor lateral acetabular spurring on the right. Femoral head is well seated. No fracture. No evidence of erosion, avascular necrosis, or focal bone abnormality. Multiple pelvic phleboliths. Lower lumbar fusion hardware is partially included. IMPRESSION: Minor right hip osteoarthritis. Electronically Signed   By: Andrea Gasman M.D.   On: 09/13/2024 14:33      Assessment/Plan Principal Problem:   Right leg pain Active Problems:   Depression, major, recurrent, moderate (HCC)   HLD (hyperlipidemia)   Hypertension   GERD (gastroesophageal reflux disease)   Right leg pain secondary to degenerative disc disease Patient presenting with worsening right leg pain that starts at the hip and goes down to her feet.  It is sharp in nature.  MRI performed on 08/30 showed L5-S1 disc protrusion with impingement of L5 and descending S1 nerve roots.  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  Patient states over the last month she has been having worsening pain and is becoming more  dependent on ADLs and IADLs.  She lives by herself and is not able to function.  Suspect her pain is secondary to the above findings as it is chronic in nature.  Patient may be having opioid hyperalgesia.  Will admit patient and titrate her multimodal pain regimen.  Unlikely to be DVT given she is on chronic anticoagulation and the chronicity of her pain.  Will check plain film of the right pelvis to rule out any fractures.  Will see her response to pain regimen and if not improving may need to reimage and consult neurosurgery.  PT/OT consulted and she will benefit from the services. -Limit opioid to outpatient regimen.  IV Dilaudid  added for breakthrough pain. -Continue Lyrica  150 mg twice daily.  If she is tolerating this well can increase to 300 mg twice daily -Started IV Robaxin .  Can uptitrate this and switch to oral once patient's pain is more controlled. - Patient's depressive symptoms appear to be well-controlled so no need for adjusting her SSRI.  Discussed using SNRI like Cymbalta  but patient states she had bad reaction with this previously.  Chronic problems: HTN: continue home meds (maxide 37.5-25 mg half tablet daily) HLD; continue home meds (zetia  daily and gets Praluent  q2weeks) GERD: continue home meds (continue home PPI) MDD: Continue home fluoxetine .   VTE prophylaxis:  Eliquis   Diet: HH Code Status:  Full Code Telemetry:  Admission status: Inpatient, Med-Surg Patient is from: Home  Anticipated d/c is to:  SNF/Home TBD  Anticipated d/c is in: 3-4 days    Family Communication: Updated at bedside   Consults called: None   Severity of Illness: The appropriate patient status for this patient is INPATIENT. Inpatient status is judged to be reasonable and necessary in order to provide the required intensity of service to ensure the patient's safety. The patient's presenting symptoms, physical exam findings, and initial radiographic and laboratory data in the context of their  chronic comorbidities is felt to place them at high risk for further clinical deterioration. Furthermore, it is not anticipated that the patient will be medically stable for discharge from the hospital within 2 midnights of admission.   * I certify that at the point of admission it is my clinical judgment that the patient will require inpatient hospital care spanning beyond 2 midnights from the point of admission due to high intensity of service, high risk for further deterioration and high frequency of surveillance required.DEWAINE Morene Bathe, MD [facility]

## 2024-09-13 NOTE — ED Triage Notes (Signed)
 Pt arrives via EMS from home with reports of right leg pain.

## 2024-09-13 NOTE — Plan of Care (Signed)

## 2024-09-14 DIAGNOSIS — M5136 Other intervertebral disc degeneration, lumbar region with discogenic back pain only: Secondary | ICD-10-CM

## 2024-09-14 DIAGNOSIS — M79604 Pain in right leg: Secondary | ICD-10-CM | POA: Diagnosis not present

## 2024-09-14 LAB — CBC
HCT: 38.1 % (ref 36.0–46.0)
Hemoglobin: 13.2 g/dL (ref 12.0–15.0)
MCH: 29.4 pg (ref 26.0–34.0)
MCHC: 34.6 g/dL (ref 30.0–36.0)
MCV: 84.9 fL (ref 80.0–100.0)
Platelets: 304 K/uL (ref 150–400)
RBC: 4.49 MIL/uL (ref 3.87–5.11)
RDW: 13.6 % (ref 11.5–15.5)
WBC: 9.7 K/uL (ref 4.0–10.5)
nRBC: 0 % (ref 0.0–0.2)

## 2024-09-14 LAB — BASIC METABOLIC PANEL WITH GFR
Anion gap: 11 (ref 5–15)
BUN: 17 mg/dL (ref 8–23)
CO2: 25 mmol/L (ref 22–32)
Calcium: 9.3 mg/dL (ref 8.9–10.3)
Chloride: 102 mmol/L (ref 98–111)
Creatinine, Ser: 0.79 mg/dL (ref 0.44–1.00)
GFR, Estimated: >60 mL/min (ref >60)
Glucose, Bld: 121 mg/dL — ABNORMAL HIGH (ref 70–99)
Potassium: 3.9 mmol/L (ref 3.5–5.1)
Sodium: 138 mmol/L (ref 135–145)

## 2024-09-14 LAB — URINE DRUG SCREEN, QUALITATIVE (ARMC ONLY)
Amphetamines, Ur Screen: NOT DETECTED
Barbiturates, Ur Screen: NOT DETECTED
Benzodiazepine, Ur Scrn: POSITIVE — AB
Cannabinoid 50 Ng, Ur ~~LOC~~: POSITIVE — AB
Cocaine Metabolite,Ur ~~LOC~~: NOT DETECTED
MDMA (Ecstasy)Ur Screen: NOT DETECTED
Methadone Scn, Ur: NOT DETECTED
Opiate, Ur Screen: POSITIVE — AB
Phencyclidine (PCP) Ur S: NOT DETECTED
Tricyclic, Ur Screen: POSITIVE — AB

## 2024-09-14 LAB — MISC LABCORP TEST (SEND OUT): Labcorp test code: 83935

## 2024-09-14 MED ORDER — LIDOCAINE 5 % EX PTCH
1.0000 | MEDICATED_PATCH | CUTANEOUS | Status: DC
Start: 2024-09-14 — End: 2024-09-18
  Administered 2024-09-14 – 2024-09-16 (×3): 1 via TRANSDERMAL
  Filled 2024-09-14 (×3): qty 1

## 2024-09-14 MED ORDER — PREDNISONE 20 MG PO TABS
20.0000 mg | ORAL_TABLET | Freq: Every day | ORAL | Status: DC
  Administered 2024-09-15: 20 mg via ORAL
  Filled 2024-09-14: qty 1

## 2024-09-14 MED ORDER — HYDROMORPHONE HCL 1 MG/ML IJ SOLN
2.0000 mg | INTRAMUSCULAR | Status: AC | PRN
  Administered 2024-09-14 (×4): 2 mg via INTRAVENOUS
  Filled 2024-09-14 (×4): qty 2

## 2024-09-14 MED ORDER — NAPROXEN 500 MG PO TABS
500.0000 mg | ORAL_TABLET | Freq: Two times a day (BID) | ORAL | Status: AC
  Administered 2024-09-14 – 2024-09-15 (×3): 500 mg via ORAL
  Filled 2024-09-14 (×3): qty 1

## 2024-09-14 MED ORDER — DICLOFENAC SODIUM 1 % EX GEL
2.0000 g | Freq: Four times a day (QID) | CUTANEOUS | Status: AC
  Administered 2024-09-14 – 2024-09-15 (×5): 2 g via TOPICAL
  Filled 2024-09-14: qty 100

## 2024-09-14 MED ORDER — POLYETHYLENE GLYCOL 3350 17 G PO PACK
17.0000 g | PACK | Freq: Two times a day (BID) | ORAL | Status: DC
  Administered 2024-09-14 – 2024-09-24 (×11): 17 g via ORAL
  Filled 2024-09-14 (×20): qty 1

## 2024-09-14 MED ORDER — POTASSIUM CHLORIDE CRYS ER 10 MEQ PO TBCR
10.0000 meq | EXTENDED_RELEASE_TABLET | Freq: Every day | ORAL | Status: DC
  Administered 2024-09-15 – 2024-09-25 (×11): 10 meq via ORAL
  Filled 2024-09-14 (×11): qty 1

## 2024-09-14 MED ORDER — PREDNISONE 20 MG PO TABS
20.0000 mg | ORAL_TABLET | Freq: Two times a day (BID) | ORAL | Status: DC
  Administered 2024-09-14 (×2): 20 mg via ORAL
  Filled 2024-09-14 (×3): qty 1

## 2024-09-14 NOTE — Evaluation (Signed)
 Occupational Therapy Evaluation Patient Details Name: Angel Riddle MRN: [id number] DOB: 06-09-1956 Today's Date: 09/14/2024   History of Present Illness   Pt is a 68 y/o F presenting to ED with c/o worsening back and R leg pain. MD assessment includes right leg pain secondary to degenerative disc disease. PMH significant for chronic back pain, MDD, HTN, GAD, GERD.     Clinical Impressions Pt was seen for OT evaluation this date. Prior to hospital admission, pt was Mod I with ADLs, required increasing assistance with IADLs, was initially Mod I with mobility with occasional use of DME when then progressed to using rollator for seated propulsion within living environment as pain progressed. Pt lives alone in a condo with only 1 step to navigate. Pt presents with deficits in ability to tolerate standing for +2 minutes which negatively impacts ability to engage in self cares, ADL transfers, and functional mobility directly related to ADLs/IADLs due to significant poorly controlled pain in RLE. Pt currently requires assistance for standing components of ADLs, requires significant seated or supine recovery periods in attempt to manage pain in RLE.  Pt would benefit from skilled OT services to address noted impairments and functional limitations (see below for any additional details) in order to maximize safety and independence while minimizing future risk of falls, injury, and readmission. Anticipate the need for follow up OT services upon acute hospital DC.      If plan is discharge home, recommend the following:   A little help with walking and/or transfers;A little help with bathing/dressing/bathroom     Functional Status Assessment   Patient has had a recent decline in their functional status and demonstrates the ability to make significant improvements in function in a reasonable and predictable amount of time.     Equipment Recommendations   BSC/3in1;Other (comment) (defer to next  venue of care)     Recommendations for Other Services         Precautions/Restrictions   Precautions Precautions: Fall Recall of Precautions/Restrictions: Intact Restrictions Weight Bearing Restrictions Per Provider Order: No     Mobility Bed Mobility Overal bed mobility: Needs Assistance Bed Mobility: Supine to Sit, Sit to Supine     Supine to sit: Contact guard, HOB elevated, Used rails Sit to supine: Min assist   General bed mobility comments: Min A required fo support RLE for sit to supine transfer    Transfers Overall transfer level: Needs assistance Equipment used: Rolling walker (2 wheels) Transfers: Sit to/from Stand Sit to Stand: Contact guard assist                  Balance Overall balance assessment: Needs assistance Sitting-balance support: Feet supported Sitting balance-Leahy Scale: Good Sitting balance - Comments: steady static and dynamic sitting   Standing balance support: Bilateral upper extremity supported, Reliant on assistive device for balance, Single extremity supported Standing balance-Leahy Scale: Fair Standing balance comment: stood at sink for grooming/hygiene tasks for +2 minutes.                           ADL either performed or assessed with clinical judgement   ADL Overall ADL's : Needs assistance/impaired Eating/Feeding: Independent   Grooming: Wash/dry face;Standing;Contact guard assist Grooming Details (indicate cue type and reason): stood at sink with CGA, pain in RLE increased to 8/10 after +2 minutes of standing         Upper Body Dressing : Modified independent;Sitting   Lower Body Dressing:  Contact guard assist;Sit to/from stand Lower Body Dressing Details (indicate cue type and reason): SBA for seated components Toilet Transfer: Contact guard assist;BSC/3in1;Rolling walker (2 wheels)   Toileting- Clothing Manipulation and Hygiene: Contact guard assist;Sit to/from stand                Vision Baseline Vision/History: 1 Wears glasses Patient Visual Report: No change from baseline       Perception         Praxis         Pertinent Vitals/Pain Pain Assessment Pain Assessment: 0-10 Pain Score: 2  Pain Location: R lower leg, increased to 8/10 in standing at sink for +2 minutes Pain Descriptors / Indicators: Sharp, Shooting, Grimacing Pain Intervention(s): Limited activity within patient's tolerance, Monitored during session, Premedicated before session, Heat applied     Extremity/Trunk Assessment Upper Extremity Assessment Upper Extremity Assessment: Overall WFL for tasks assessed   Lower Extremity Assessment Lower Extremity Assessment: Defer to PT evaluation RLE Deficits / Details: formal MMT testing deferred due to pain, (+) SLR with peroneal nerve bias. RLE Sensation: decreased light touch (in L4-5 dermatome)       Communication Communication Communication: No apparent difficulties   Cognition Arousal: Alert Behavior During Therapy: WFL for tasks assessed/performed                                 Following commands: Intact       Cueing  General Comments   Cueing Techniques: Verbal cues;Tactile cues  R lower leg with increased swelling compared to L, no redness or calf pain   Exercises Other Exercises Other Exercises: Education on role of OT in Acute, positioning techniques for pain management in RLE, fall risk management, education on DME (3:1/RW)   Shoulder Instructions      Home Living Family/patient expects to be discharged to:: Private residence Living Arrangements: Alone Available Help at Discharge: Family;Available PRN/intermittently Type of Home: Other(Comment) (condo) Home Access: Level entry     Home Layout: One level     Bathroom Shower/Tub: Producer, television/film/video: Standard Bathroom Accessibility: Yes How Accessible: Accessible via walker Home Equipment: Rolling Walker (2 wheels);Rollator  (4 wheels);Cane - single point;Shower seat          Prior Functioning/Environment Prior Level of Function : Independent/Modified Independent             Mobility Comments: Pt reports that she typically uses SPC for amb, has recently had to use rollator due to worsening R leg pain ADLs Comments: IND with ADLs    OT Problem List: Decreased activity tolerance;Decreased safety awareness;Impaired balance (sitting and/or standing)   OT Treatment/Interventions: Self-care/ADL training;Therapeutic activities;Patient/family education;DME and/or AE instruction;Balance training      OT Goals(Current goals can be found in the care plan section)   Acute Rehab OT Goals Patient Stated Goal: Move without pain OT Goal Formulation: With patient Time For Goal Achievement: 09/28/24 Potential to Achieve Goals: Good ADL Goals Pt Will Perform Grooming: with modified independence;standing Pt Will Transfer to Toilet: with modified independence;bedside commode Pt Will Perform Toileting - Clothing Manipulation and hygiene: with modified independence;sit to/from stand   OT Frequency:  Min 2X/week    Co-evaluation              AM-PAC OT 6 Clicks Daily Activity     Outcome Measure Help from another person eating meals?: None Help from another person taking care of  personal grooming?: None Help from another person toileting, which includes using toliet, bedpan, or urinal?: A Little Help from another person bathing (including washing, rinsing, drying)?: A Little Help from another person to put on and taking off regular upper body clothing?: None Help from another person to put on and taking off regular lower body clothing?: A Little 6 Click Score: 21   End of Session Equipment Utilized During Treatment: Gait belt;Rolling walker (2 wheels) Nurse Communication: Mobility status  Activity Tolerance: Patient tolerated treatment well;Patient limited by pain Patient left: in bed;with call  bell/phone within reach;with bed alarm set;with family/visitor present  OT Visit Diagnosis: Unsteadiness on feet (R26.81)                Time: 8754-8682 OT Time Calculation (min): 32 min Charges:  OT General Charges $OT Visit: 1 Visit OT Evaluation $OT Eval Low Complexity: 1 Low OT Treatments $Self Care/Home Management : 23-37 mins  Harlene Sharps OTR/L   Harlene LITTIE Sharps 09/14/2024, 3:38 PM

## 2024-09-14 NOTE — Evaluation (Signed)
 Physical Therapy Evaluation Patient Details Name: SANDE PICKERT MRN: [id number] DOB: 1956-06-10 Today's Date: 09/14/2024  History of Present Illness  Pt is a 68 y/o F presenting to ED with c/o worsening back and R leg pain. MD assessment includes right leg pain secondary to degenerative disc disease. PMH significant for chronic back pain, MDD, HTN, GAD, GERD.  Clinical Impression  Pt A&Ox4, pleasant and agreeable to participate in PT evaluation. At baseline, pt is modI with [facility] for amb but cites recent use of rollator in the last week due to worsening RLE pain, usually IND with ADLs, denies hx of falls. Pt became emotional during session when discussing her current limitations, citing she did not want to be a burden to her family. During session, pt was CGA for bed mobility, STS transfers, and short-distance amb with no LOB throughout. Amb distance was self-limited due to pt reporting increased RLE pain with weight bearing, heavy BUE support on RW throughout. Pt was noted to have a positive SLR with peroneal nerve bias, reproducing pt's RLE symptoms. Pt was left semi-supine in bed at end of session, all needs in reach, supportive family at bedside. Pt would benefit from skilled PT intervention to address listed deficits (see PT Problem List) and allow for safe return to PLOF.         If plan is discharge home, recommend the following: A little help with walking and/or transfers;A little help with bathing/dressing/bathroom;Assist for transportation   Can travel by private vehicle   Yes    Equipment Recommendations Other (comment) (TBD at next venue of care)  Recommendations for Other Services       Functional Status Assessment Patient has had a recent decline in their functional status and demonstrates the ability to make significant improvements in function in a reasonable and predictable amount of time.     Precautions / Restrictions Precautions Precautions: Fall Recall of  Precautions/Restrictions: Intact Restrictions Weight Bearing Restrictions Per Provider Order: No      Mobility  Bed Mobility Overal bed mobility: Needs Assistance Bed Mobility: Supine to Sit, Sit to Supine     Supine to sit: Contact guard, HOB elevated, Used rails Sit to supine: Contact guard assist   General bed mobility comments: no physical assistance required, inc time/effort and use of bed features    Transfers Overall transfer level: Needs assistance Equipment used: Rolling walker (2 wheels) Transfers: Sit to/from Stand Sit to Stand: Min assist           General transfer comment: 2 STS from EOB with minA to achieve standing    Ambulation/Gait Ambulation/Gait assistance: Contact guard assist Gait Distance (Feet): 12 Feet Assistive device: Rolling walker (2 wheels) Gait Pattern/deviations: Step-through pattern, Decreased stance time - right, Decreased weight shift to right, Wide base of support Gait velocity: decreased     General Gait Details: Self-limited distance due to increase in RLE pain with WB. Poor WB acceptance and stance time on RLE, no LOB throughout with heavy BUE support on RW  Stairs            Wheelchair Mobility     Tilt Bed    Modified Rankin (Stroke Patients Only)       Balance Overall balance assessment: Needs assistance Sitting-balance support: Feet supported Sitting balance-Leahy Scale: Good Sitting balance - Comments: steady static and dynamic sitting   Standing balance support: Bilateral upper extremity supported, Reliant on assistive device for balance Standing balance-Leahy Scale: Fair Standing balance comment: heavy BUE support  on RW                             Pertinent Vitals/Pain Pain Assessment Pain Assessment: 0-10 Pain Score: 2  Pain Location: R lower leg Pain Descriptors / Indicators: Sharp, Shooting, Grimacing Pain Intervention(s): Limited activity within patient's tolerance, Monitored during  session, Premedicated before session, Repositioned    Home Living Family/patient expects to be discharged to:: Private residence Living Arrangements: Alone Available Help at Discharge: Family;Available PRN/intermittently Type of Home: Other(Comment) (condo) Home Access: Level entry       Home Layout: One level Home Equipment: Agricultural consultant (2 wheels);Rollator (4 wheels);Cane - single point;Shower seat      Prior Function Prior Level of Function : Independent/Modified Independent             Mobility Comments: Pt reports that she typically uses [facility] for amb, has recently had to use rollator due to worsening R leg pain ADLs Comments: IND with ADLs     Extremity/Trunk Assessment   Upper Extremity Assessment Upper Extremity Assessment: Defer to OT evaluation    Lower Extremity Assessment Lower Extremity Assessment: RLE deficits/detail RLE Deficits / Details: formal MMT testing deferred due to pain, (+) SLR with peroneal nerve bias. RLE Sensation: decreased light touch (in L4-5 dermatome)       Communication   Communication Communication: No apparent difficulties    Cognition Arousal: Alert Behavior During Therapy: WFL for tasks assessed/performed   PT - Cognitive impairments: No apparent impairments                       PT - Cognition Comments: A&Ox4, very pleasant Following commands: Intact       Cueing       General Comments General comments (skin integrity, edema, etc.): R lower leg with increased swelling compared to L, no redness or calf pain    Exercises     Assessment/Plan    PT Assessment Patient needs continued PT services  PT Problem List Decreased strength;Decreased activity tolerance;Decreased balance;Decreased mobility;Impaired sensation;Pain       PT Treatment Interventions DME instruction;Gait training;Functional mobility training;Therapeutic activities;Therapeutic exercise;Balance training;Neuromuscular  re-education;Patient/family education    PT Goals (Current goals can be found in the Care Plan section)  Acute Rehab PT Goals Patient Stated Goal: to get her pain under control PT Goal Formulation: With patient/family Time For Goal Achievement: 09/28/24 Potential to Achieve Goals: Fair    Frequency Min 2X/week     Co-evaluation               AM-PAC PT 6 Clicks Mobility  Outcome Measure Help needed turning from your back to your side while in a flat bed without using bedrails?: None Help needed moving from lying on your back to sitting on the side of a flat bed without using bedrails?: A Little Help needed moving to and from a bed to a chair (including a wheelchair)?: A Little Help needed standing up from a chair using your arms (e.g., wheelchair or bedside chair)?: A Little Help needed to walk in hospital room?: A Little Help needed climbing 3-5 steps with a railing? : A Lot 6 Click Score: 18    End of Session Equipment Utilized During Treatment: Gait belt Activity Tolerance: Patient limited by pain Patient left: in bed;with call bell/phone within reach;with bed alarm set;with family/visitor present Nurse Communication: Mobility status;Patient requests pain meds PT Visit Diagnosis: Unsteadiness on feet (  R26.81);Muscle weakness (generalized) (M62.81);Difficulty in walking, not elsewhere classified (R26.2);Pain Pain - Right/Left: Right Pain - part of body: Leg    Time: 8954-8887 PT Time Calculation (min) (ACUTE ONLY): 27 min   Charges:   PT Evaluation $PT Eval Moderate Complexity: 1 Mod PT Treatments $Therapeutic Activity: 8-22 mins PT General Charges $$ ACUTE PT VISIT: 1 Visit         Janell Axe, SPT

## 2024-09-14 NOTE — Progress Notes (Addendum)
 PROGRESS NOTE Angel Riddle    DOB: 1956-01-06, 68 y.o.  FMW:[id number]    Code Status: Limited: Do not attempt resuscitation (DNR) -DNR-LIMITED -Do Not Intubate/DNI    DOA: 09/13/2024   LOS: 1  Brief hospital course  Angel Riddle is a 68 y.o. female with a PMH significant for chronic back pain secondary to disc herniation, MDD, hypertension, generalized anxiety disorder, GERD presenting to the ED with worsening back pain and right leg pain.  Last epidural injection about 3 weeks ago. This was second epidural.   Pt had back surgery in March 2025. First back surgery in 2018. Plan for operative management in 12/2024.   ED Course: hemodynamically stable.  She is having worsening pain in her back and right lower leg that is worse than her chronic pain. Given she had a recent ED visit, [facility] was consulted for admission.  No lab work or imaging done in the ED.  They were initially treated with analgesia.   Patient was admitted to medicine service for further workup and management of back pain as outlined in detail below.  09/14/24 -felt well this morning  Assessment & Plan  Principal Problem:   Right leg pain Active Problems:   Depression, major, recurrent, moderate (HCC)   HLD (hyperlipidemia)   Hypertension   GERD (gastroesophageal reflux disease)  Right leg pain secondary to degenerative disc disease- MRI performed on 08/30 showed L5-S1 disc protrusion with impingement of L5 and descending S1 nerve roots.  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   -Limit opioid to outpatient regimen.  IV Dilaudid  added for breakthrough pain. -Continue Lyrica  150 mg twice daily.  If she is tolerating this well can increase to 300 mg twice daily -continue IV Robaxin . - trial of steroids and antiinflammatories.  - topical voltaren and lidocaine  - added on bowel regimen - PT/OT   Chronic problems: HTN: continue home meds (maxide  37.5-25 mg half tablet daily) HLD; continue home meds (zetia  daily and gets Praluent  q2weeks) GERD: continue home meds (continue home PPI) MDD: Continue home fluoxetine .   Body mass index is 28.98 kg/m.  VTE ppx:  apixaban  (ELIQUIS ) tablet 5 mg   Diet:     Diet   Diet Heart Room service appropriate? Yes; Fluid consistency: Thin   Consultants: None   Subjective 09/14/24    Pt reports feeling well this morning She got some good rest. Has not been out of bed since arrival.    Objective  Blood pressure 126/71, pulse 72, temperature 98.6 F (37 C), resp. rate 18, height 5' 5 (1.651 m), weight 79 kg, SpO2 95%.  Intake/Output Summary (Last 24 hours) at 09/14/2024 0729 Last data filed at 09/14/2024 0352 Gross per 24 hour  Intake 0 ml  Output 500 ml  Net -500 ml   Filed Weights   09/13/24 1024  Weight: 79 kg    Physical Exam:  General: awake, alert, NAD HEENT: atraumatic, clear conjunctiva, anicteric sclera, MMM, hearing grossly normal Respiratory: normal respiratory effort. Cardiovascular: extremities well perfused, quick capillary refill, normal S1/S2, RRR, no JVD, murmurs Nervous: A&O x3. no gross focal neurologic deficits, normal speech Extremities: moves all equally, no edema, normal tone Skin: dry, intact, normal temperature, normal color. No rashes, lesions or ulcers on exposed skin Psychiatry: normal mood, congruent affect  Labs   I have personally reviewed the following labs and imaging studies CBC    Component Value Date/Time   WBC 9.7  09/14/2024 0328   RBC 4.49 09/14/2024 0328   HGB 13.2 09/14/2024 0328   HGB 13.2 01/02/2012 1820   HCT 38.1 09/14/2024 0328   HCT 38.2 01/02/2012 1820   PLT 304 09/14/2024 0328   PLT 270 01/02/2012 1820   MCV 84.9 09/14/2024 0328   MCV 86 01/02/2012 1820   MCH 29.4 09/14/2024 0328   MCHC 34.6 09/14/2024 0328   RDW 13.6 09/14/2024 0328   RDW 12.4 01/02/2012 1820   LYMPHSABS 1.0 09/08/2024 1303   MONOABS 0.4  09/08/2024 1303   EOSABS 0.0 09/08/2024 1303   BASOSABS 0.0 09/08/2024 1303      Latest Ref Rng & Units 09/14/2024    3:28 AM 09/13/2024    3:19 PM 09/08/2024    1:03 PM  BMP  Glucose 70 - 99 mg/dL 878  849  94   BUN 8 - 23 mg/dL 17  19  16    Creatinine 0.44 - 1.00 mg/dL 9.20  9.14  9.17   Sodium 135 - 145 mmol/L 138  138  137   Potassium 3.5 - 5.1 mmol/L 3.9  3.3  3.3   Chloride 98 - 111 mmol/L 102  102  102   CO2 22 - 32 mmol/L 25  25  22    Calcium 8.9 - 10.3 mg/dL 9.3  9.2  9.0     DG HIP UNILAT WITH PELVIS 2-3 VIEWS RIGHT Result Date: 09/13/2024 CLINICAL DATA:  Right leg pain. Pain radiates from hip into the foot. Injury EXAM: DG HIP (WITH OR WITHOUT PELVIS) 2-3V RIGHT COMPARISON:  05/22/2019 FINDINGS: The hip joint spaces are preserved. There is minor lateral acetabular spurring on the right. Femoral head is well seated. No fracture. No evidence of erosion, avascular necrosis, or focal bone abnormality. Multiple pelvic phleboliths. Lower lumbar fusion hardware is partially included. IMPRESSION: Minor right hip osteoarthritis. Electronically Signed   By: Andrea Gasman M.D.   On: 09/13/2024 14:33   Disposition Plan & Communication  Patient status: Inpatient  Admitted From: Home Planned disposition location: TBD Anticipated discharge date: TBD pending clinical improvement   Family Communication: daughter at bedside    Author: Marien LITTIE Piety, DO [facility] 09/14/2024, 7:29 AM   Available by Epic secure chat 7AM-7PM. If 7PM-7AM, please contact night-coverage.  [facility] contact information found on [email or web address].

## 2024-09-15 DIAGNOSIS — M79604 Pain in right leg: Secondary | ICD-10-CM | POA: Diagnosis not present

## 2024-09-15 DIAGNOSIS — M5136 Other intervertebral disc degeneration, lumbar region with discogenic back pain only: Secondary | ICD-10-CM | POA: Diagnosis not present

## 2024-09-15 MED ORDER — HYDROMORPHONE HCL 1 MG/ML IJ SOLN
1.0000 mg | INTRAMUSCULAR | Status: AC | PRN
  Administered 2024-09-15: 1 mg via INTRAVENOUS
  Filled 2024-09-15: qty 1

## 2024-09-15 MED ORDER — PREDNISONE 20 MG PO TABS
20.0000 mg | ORAL_TABLET | Freq: Every day | ORAL | Status: AC
  Administered 2024-09-17 – 2024-09-20 (×4): 20 mg via ORAL
  Filled 2024-09-15 (×4): qty 1

## 2024-09-15 MED ORDER — TRAZODONE HCL 100 MG PO TABS: 100.0000 mg | ORAL_TABLET | Freq: Every day | ORAL | Status: DC

## 2024-09-15 MED ORDER — PREDNISONE 20 MG PO TABS
20.0000 mg | ORAL_TABLET | Freq: Two times a day (BID) | ORAL | Status: AC
  Administered 2024-09-15 – 2024-09-16 (×3): 20 mg via ORAL
  Filled 2024-09-15 (×3): qty 1

## 2024-09-15 MED ORDER — HYDROCODONE-ACETAMINOPHEN 7.5-325 MG PO TABS
1.0000 | ORAL_TABLET | ORAL | Status: DC | PRN
  Administered 2024-09-15 (×3): 2 via ORAL
  Filled 2024-09-15 (×3): qty 2

## 2024-09-15 MED ORDER — HYDROMORPHONE HCL 1 MG/ML IJ SOLN
1.0000 mg | Freq: Once | INTRAMUSCULAR | Status: AC
  Administered 2024-09-15: 1 mg via INTRAVENOUS
  Filled 2024-09-15: qty 1

## 2024-09-15 MED ORDER — PREGABALIN 75 MG PO CAPS
200.0000 mg | ORAL_CAPSULE | Freq: Two times a day (BID) | ORAL | Status: DC
  Administered 2024-09-15 – 2024-09-25 (×20): 200 mg via ORAL
  Filled 2024-09-15 (×20): qty 1

## 2024-09-15 MED ORDER — TRAZODONE HCL 100 MG PO TABS
100.0000 mg | ORAL_TABLET | Freq: Every day | ORAL | Status: DC
  Administered 2024-09-15 – 2024-09-24 (×8): 100 mg via ORAL
  Filled 2024-09-15 (×8): qty 1

## 2024-09-15 NOTE — Progress Notes (Signed)
 PROGRESS NOTE Angel Riddle    DOB: 1956-08-26, 68 y.o.  FMW:[id number]    Code Status: Limited: Do not attempt resuscitation (DNR) -DNR-LIMITED -Do Not Intubate/DNI    DOA: 09/13/2024   LOS: 2  Brief hospital course  Angel Riddle is a 68 y.o. female with a PMH significant for chronic back pain secondary to disc herniation, MDD, hypertension, generalized anxiety disorder, GERD presenting to the ED with worsening back pain and right leg pain.  Last epidural injection about 3 weeks ago. This was second epidural.   Pt had back surgery in March 2025. First back surgery in 2018. Plan for operative management in 12/2024.   ED Course: hemodynamically stable. Hip xray did not show acute findings. Positive for osteoarthritis.   They were initially treated with analgesia.   Patient was admitted to medicine service for further workup and management of acute on chronic back pain as outlined in detail below.  09/15/24 -feels unchanged today. Agreeable to SNF.   Assessment & Plan  Principal Problem:   Right leg pain Active Problems:   Depression, major, recurrent, moderate (HCC)   HLD (hyperlipidemia)   Hypertension   GERD (gastroesophageal reflux disease)  Right leg pain secondary to degenerative disc disease- MRI performed on 08/30 showed L5-S1 disc protrusion with impingement of L5 and descending S1 nerve roots.  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   -Limit opioid to outpatient regimen. Hydrocodone - has been taking 2 7.5mg  several times per day.  - IV Dilaudid  added for breakthrough pain. -Continue Lyrica - increased to 200mg  BID -continue IV Robaxin . - trial of steroids and antiinflammatories.  - topical voltaren and lidocaine  - added on bowel regimen - PT/OT- recommending SNF. Patient is agreeable to this   Chronic problems: HTN: continue home meds (maxide 37.5-25 mg half tablet daily) HLD; continue home meds  (zetia  daily and gets Praluent  q2weeks) GERD: continue home meds (continue home PPI) MDD: Continue home fluoxetine .   Body mass index is 28.98 kg/m.  VTE ppx:  apixaban  (ELIQUIS ) tablet 5 mg   Diet:     Diet   Diet Heart Room service appropriate? Yes; Fluid consistency: Thin   Consultants: None   Subjective 09/15/24    Pt reports feeling pain today that is worsened due to waiting for pain medications and have not kicked in yet. Describes a long list of family losses of recently. She is agreeable to going to SNF.   Objective  Blood pressure 126/71, pulse 72, temperature 98.6 F (37 C), resp. rate 18, height 5' 5 (1.651 m), weight 79 kg, SpO2 95%.  Intake/Output Summary (Last 24 hours) at 09/15/2024 9277 Last data filed at 09/15/2024 0500 Gross per 24 hour  Intake 0 ml  Output 300 ml  Net -300 ml   Filed Weights   09/13/24 1024  Weight: 79 kg    Physical Exam:  General: awake, alert, NAD HEENT: atraumatic, clear conjunctiva, anicteric sclera, MMM, hearing grossly normal Respiratory: normal respiratory effort. Cardiovascular: extremities well perfused, quick capillary refill, normal S1/S2, RRR, no JVD, murmurs Nervous: A&O x3. no gross focal neurologic deficits, normal speech Extremities: moves all equally, no edema, normal tone Skin: dry, intact, normal temperature, normal color. No rashes, lesions or ulcers on exposed skin Psychiatry: normal mood, congruent affect  Labs   I have personally reviewed the following labs and imaging studies CBC    Component Value Date/Time   WBC 9.7 09/14/2024 0328   RBC  4.49 09/14/2024 0328   HGB 13.2 09/14/2024 0328   HGB 13.2 01/02/2012 1820   HCT 38.1 09/14/2024 0328   HCT 38.2 01/02/2012 1820   PLT 304 09/14/2024 0328   PLT 270 01/02/2012 1820   MCV 84.9 09/14/2024 0328   MCV 86 01/02/2012 1820   MCH 29.4 09/14/2024 0328   MCHC 34.6 09/14/2024 0328   RDW 13.6 09/14/2024 0328   RDW 12.4 01/02/2012 1820   LYMPHSABS 1.0  09/08/2024 1303   MONOABS 0.4 09/08/2024 1303   EOSABS 0.0 09/08/2024 1303   BASOSABS 0.0 09/08/2024 1303      Latest Ref Rng & Units 09/14/2024    3:28 AM 09/13/2024    3:19 PM 09/08/2024    1:03 PM  BMP  Glucose 70 - 99 mg/dL 878  849  94   BUN 8 - 23 mg/dL 17  19  16    Creatinine 0.44 - 1.00 mg/dL 9.20  9.14  9.17   Sodium 135 - 145 mmol/L 138  138  137   Potassium 3.5 - 5.1 mmol/L 3.9  3.3  3.3   Chloride 98 - 111 mmol/L 102  102  102   CO2 22 - 32 mmol/L 25  25  22    Calcium 8.9 - 10.3 mg/dL 9.3  9.2  9.0     DG HIP UNILAT WITH PELVIS 2-3 VIEWS RIGHT Result Date: 09/13/2024 CLINICAL DATA:  Right leg pain. Pain radiates from hip into the foot. Injury EXAM: DG HIP (WITH OR WITHOUT PELVIS) 2-3V RIGHT COMPARISON:  05/22/2019 FINDINGS: The hip joint spaces are preserved. There is minor lateral acetabular spurring on the right. Femoral head is well seated. No fracture. No evidence of erosion, avascular necrosis, or focal bone abnormality. Multiple pelvic phleboliths. Lower lumbar fusion hardware is partially included. IMPRESSION: Minor right hip osteoarthritis. Electronically Signed   By: Andrea Gasman M.D.   On: 09/13/2024 14:33   Disposition Plan & Communication  Patient status: Inpatient  Admitted From: Home Planned disposition location: SNF Anticipated discharge date: TBD pending SNF placement   Family Communication: none at bedside    Author: Marien LITTIE Piety, DO [facility] 09/15/2024, 7:22 AM   Available by Epic secure chat 7AM-7PM. If 7PM-7AM, please contact night-coverage.  [facility] contact information found on [email or web address].

## 2024-09-15 NOTE — Progress Notes (Signed)
 Physical Therapy Treatment Patient Details Name: Angel Riddle MRN: [id number] DOB: 15-Jan-1956 Today's Date: 09/15/2024   History of Present Illness Pt is a 68 y/o F presenting to ED with c/o worsening back and R leg pain. MD assessment includes right leg pain secondary to degenerative disc disease. PMH significant for chronic back pain, MDD, HTN, GAD, GERD.    PT Comments  Pt was long sitting in bed upon arrival. She is A and O x 4 but continues to endorse severe RLE pain. 8/10 pain at rest that elevated to 10/10 with ambulation. RN is aware. Pt was premedicated for pain prior to session. She requires assistance to safely exit bed, stand to RW, and tolerate ambulation ~ 20 ft prior to needing seated rest. BLE elevated with ice pack placed on RLE. Overall pt is progressing but slowly due to pain. DC recs remain appropriate to maximize independence and safety with all ADLs.    If plan is discharge home, recommend the following: A little help with walking and/or transfers;A little help with bathing/dressing/bathroom;Assist for transportation     Equipment Recommendations  Other (comment) (Defer to next level of care)       Precautions / Restrictions Precautions Precautions: Fall Recall of Precautions/Restrictions: Intact Restrictions Weight Bearing Restrictions Per Provider Order: No     Mobility  Bed Mobility Overal bed mobility: Needs Assistance Bed Mobility: Supine to Sit, Sit to Supine  Supine to sit: Min assist, HOB elevated, Used rails   Transfers Overall transfer level: Needs assistance Equipment used: Rolling walker (2 wheels) Transfers: Sit to/from Stand Sit to Stand: Contact guard assist  General transfer comment: CGA for safety with vcs for improved technique and sequencing    Ambulation/Gait Ambulation/Gait assistance: Contact guard assist Gait Distance (Feet): 20 Feet Assistive device: Rolling walker (2 wheels) Gait Pattern/deviations: Step-through pattern,  Decreased stance time - right, Decreased weight shift to right, Wide base of support  General Gait Details: pt continues to limit gait distances 2/2 to pain. chair follow for additional safety. no LOB however dependent on RW for all dynamic standing activity    Balance Overall balance assessment: Needs assistance Sitting-balance support: Feet supported Sitting balance-Leahy Scale: Good     Standing balance support: Bilateral upper extremity supported, During functional activity, Reliant on assistive device for balance Standing balance-Leahy Scale: Good Standing balance comment: no LOB with BUE support       Communication Communication Communication: No apparent difficulties  Cognition Arousal: Alert Behavior During Therapy: WFL for tasks assessed/performed   PT - Cognitive impairments: No apparent impairments    PT - Cognition Comments: A&Ox4, very talkative Following commands: Intact      Cueing Cueing Techniques: Verbal cues, Tactile cues         Pertinent Vitals/Pain Pain Assessment Pain Assessment: No/denies pain Pain Score: 8  Pain Location: R lower leg, increased to 10/10 in standing/ ambulation. RN is aware Pain Descriptors / Indicators: Sharp, Shooting, Grimacing Pain Intervention(s): Limited activity within patient's tolerance, Premedicated before session, Monitored during session, Repositioned     PT Goals (current goals can now be found in the care plan section) Acute Rehab PT Goals Patient Stated Goal: to get her pain under control Progress towards PT goals: Progressing toward goals    Frequency    Min 2X/week       AM-PAC PT 6 Clicks Mobility   Outcome Measure  Help needed turning from your back to your side while in a flat bed without using bedrails?: None  Help needed moving from lying on your back to sitting on the side of a flat bed without using bedrails?: A Little Help needed moving to and from a bed to a chair (including a wheelchair)?: A  Little Help needed standing up from a chair using your arms (e.g., wheelchair or bedside chair)?: A Little Help needed to walk in hospital room?: A Little Help needed climbing 3-5 steps with a railing? : A Little 6 Click Score: 19    End of Session   Activity Tolerance: Patient limited by pain Patient left: in chair;with call bell/phone within reach;with chair alarm set Nurse Communication: Mobility status;Patient requests pain meds (RN aware however will have to request order due to pt not being due up for next round of pain medications.) PT Visit Diagnosis: Unsteadiness on feet (R26.81);Muscle weakness (generalized) (M62.81);Difficulty in walking, not elsewhere classified (R26.2);Pain Pain - Right/Left: Right Pain - part of body: Leg     Time: 9041-8982 PT Time Calculation (min) (ACUTE ONLY): 19 min  Charges:    $Therapeutic Activity: 8-22 mins PT General Charges $$ ACUTE PT VISIT: 1 Visit                    Rankin Essex PTA 09/15/24, 12:33 PM

## 2024-09-15 NOTE — Plan of Care (Signed)
 Palliative consult received.  From chart review, Ms. Rappleye seems to suffer from chronic pain not related to a life limiting illness, not appropriate for Palliative Care involvement at this time. Recommend collaborating with and referring to chronic pain management. Informed primary attending via secure chat.  No Charge.  Waddell Lesches, DNP, AGNP-C Palliative Medicine  Please call Palliative Medicine team phone with any questions [phone]. For individual providers please see AMION.

## 2024-09-15 NOTE — Plan of Care (Signed)

## 2024-09-16 DIAGNOSIS — M79604 Pain in right leg: Secondary | ICD-10-CM | POA: Diagnosis not present

## 2024-09-16 DIAGNOSIS — M5136 Other intervertebral disc degeneration, lumbar region with discogenic back pain only: Secondary | ICD-10-CM | POA: Diagnosis not present

## 2024-09-16 MED ORDER — EZETIMIBE 10 MG PO TABS
10.0000 mg | ORAL_TABLET | ORAL | Status: DC
  Administered 2024-09-17 – 2024-09-24 (×4): 10 mg via ORAL
  Filled 2024-09-16 (×4): qty 1

## 2024-09-16 MED ORDER — ALPRAZOLAM 0.5 MG PO TABS
0.5000 mg | ORAL_TABLET | Freq: Once | ORAL | Status: AC
  Administered 2024-09-16: 0.5 mg via ORAL
  Filled 2024-09-16: qty 1

## 2024-09-16 MED ORDER — FENTANYL 25 MCG/HR TD PT72
1.0000 | MEDICATED_PATCH | TRANSDERMAL | Status: DC
Start: 2024-09-16 — End: 2024-09-25
  Administered 2024-09-16 – 2024-09-22 (×3): 1 via TRANSDERMAL
  Filled 2024-09-16 (×5): qty 1

## 2024-09-16 MED ORDER — HYDROCODONE-ACETAMINOPHEN 10-325 MG PO TABS
2.0000 | ORAL_TABLET | ORAL | Status: DC | PRN
  Administered 2024-09-16 – 2024-09-25 (×32): 2 via ORAL
  Filled 2024-09-16 (×37): qty 2

## 2024-09-16 MED ORDER — ORAL CARE MOUTH RINSE: 15.0000 mL | OROMUCOSAL | Status: DC | PRN

## 2024-09-16 NOTE — TOC Initial Note (Signed)
 Transition of Care [facility]) - Initial/Assessment Note    Patient Details  Name: Angel Riddle MRN: [id number] Date of Birth: 09-22-1956  Transition of Care [facility]) CM/SW Contact:    Marinda Cooks, RN Phone Number: 09/16/2024, 8:54 AM  Clinical Narrative:                 This CM spoke with pt introduced role  and completed Initial assessement. Pt A&Ox4 . Pt reports living in a condo w/o steps to enter. Pt reports she has family support provided by Children (Daughter ) Angel Riddle , (Son) Angel Riddle , & Angel Riddle that live adjacent to pt. Pt uses [facility]  [address] [place], [place], [place] [address] & PCP is Dr.Charlene Glendia .Pt has DME at home that includes RW,Shower chair, Rollator &,Cane with seat.  Pt reports not  being in SNF prior to  this admission.  This CM spoke with pt in detailed regarding PT recommendations for SNF/Rehab and pt confirmed she was in agreement with this dc plan and informed her 1st choice is [facility]. FL2 completed by this CM MD made aware to sign , bed offers sent awaiting responses to provide pt for choice selection. TOC will cont to follow DC planning / care coordination and update as applicable.    CM will cont to follow pt during hospital stay and update as applicable.         Patient Goals and CMS Choice            Expected Discharge Plan and Services                                              Prior Living Arrangements/Services                       Activities of Daily Living   ADL Screening (condition at time of admission) Independently performs ADLs?: No Does the patient have a NEW difficulty with bathing/dressing/toileting/self-feeding that is expected to last >3 days?: Yes (Initiates electronic notice to provider for possible OT consult) Does the patient have a NEW difficulty with getting in/out of bed, walking, or climbing stairs that is expected to last >3 days?: Yes (Initiates electronic notice to  provider for possible PT consult) Does the patient have a NEW difficulty with communication that is expected to last >3 days?: No Is the patient deaf or have difficulty hearing?: No Does the patient have difficulty seeing, even when wearing glasses/contacts?: No Does the patient have difficulty concentrating, remembering, or making decisions?: No  Permission Sought/Granted                  Emotional Assessment              Admission diagnosis:  Lumbar radiculopathy [M54.16] Pain [R52] Right leg pain [M79.604] Lumbar disc herniation [M51.26] Patient Active Problem List   Diagnosis Date Noted   Right leg pain 09/13/2024   Bad odor of urine 05/30/2024   Abdominal pain 05/07/2024   History of pulmonary embolus (PE) 04/13/2024   Spondylolisthesis of lumbar region 02/08/2024   Hematuria 11/01/2023   Lumbar foraminal stenosis 07/29/2023   Change in bowel movement 06/19/2023   Low back pain 05/08/2023   Knee pain 04/06/2023   Thumb pain 04/05/2023   Muscle ache 03/29/2023   Acute non-recurrent sinusitis 12/31/2022  B12 deficiency 09/26/2022   Tachycardia 04/06/2022   Dysuria 01/18/2022   Leg pain 05/13/2021   Double vision 03/14/2021   Female bladder prolapse 03/14/2021   Tinnitus 07/05/2020   Chronic left sacroiliac pain 05/21/2020   Chronic pain of left knee 05/21/2020   Bilateral hip pain 05/21/2020   Left elbow pain 05/21/2020   Fatigue 12/30/2019   Weight loss counseling, encounter for 12/30/2019   Breast cancer screening 07/07/2019   Aortic atherosclerosis 02/10/2019   GERD (gastroesophageal reflux disease) 06/20/2018   Chest pain 01/06/2018   Lumbar stenosis with neurogenic claudication 09/15/2017   BMI 31.0-31.9,adult 04/25/2017   Hyperglycemia 04/25/2017   Personal history of tobacco use, presenting hazards to health 12/15/2016   History of colonic polyps    Anal fissure    Benign neoplasm of sigmoid colon    Left arm pain 08/03/2015   Cough  07/30/2015   Health care maintenance 01/12/2015   Abdominal fullness 03/10/2014   Numbness and tingling 10/02/2013   Hypertension 10/02/2013   Chronic back pain 07/18/2013   Depression, major, recurrent, moderate (HCC) 07/18/2013   Anxiety 07/18/2013   Environmental allergies 07/18/2013   HLD (hyperlipidemia) 07/18/2013   History of colonic polyps 07/18/2013   Allergy to environmental factors 07/18/2013   PCP:  [name], MD Pharmacy:   [facility] - [place], [place] - [address] [place] [address] Phone: [phone] Fax: [phone]  DARRYLE LONG - [facility] [address] [place] [address] Phone: [phone] Fax: [phone]     Social Drivers of Health (SDOH) Social History: SDOH Screenings   Food Insecurity: No Food Insecurity (09/13/2024)  Housing: Low Risk  (09/13/2024)  Transportation Needs: No Transportation Needs (09/13/2024)  Utilities: Not At Risk (09/13/2024)  Alcohol Screen: Low Risk  (06/23/2023)  Depression (PHQ2-9): High Risk (09/07/2024)  Financial Resource Strain: Low Risk  (09/02/2024)   Received from [facility]  Physical Activity: Unknown (06/23/2023)  Social Connections: Moderately Integrated (09/13/2024)  Stress: No Stress Concern Present (06/23/2023)  Tobacco Use: High Risk (09/13/2024)   SDOH Interventions:     Readmission Risk Interventions     No data to display

## 2024-09-16 NOTE — Progress Notes (Addendum)
       CROSS COVER NOTE  NAME: JACKIE RUSSMAN MRN: [id number] DOB : [birth year]    Concern as stated by nurse / staff   r chronic back pain secondary to disc herniation, MDD, hypertension, generalized anxiety disorder, GERD presenting to the ED with worsening back pain and right leg pain. Pt has not responded well to oxy or Toradol  or volterra gel. 4hr prn Narco is not available to her till 3 am. She said that last dose of dilaudid  (which in records show 1 mg) was not suffice and requested to be given higher dose than that. Please kindly advise as pt reports pain of 10.      Pertinent findings on chart review: Per last progress note: -Limit opioid to outpatient regimen. Hydrocodone - has been taking 2 7.5mg  several times per day.  - IV Dilaudid  added for breakthrough pain. -Continue Lyrica - increased to 200mg  BID -continue IV Robaxin . - trial of steroids and antiinflammatories.  - topical voltaren and lidocaine  - added on bowel regimen - PT/OT- recommending SNF. Patient is agreeable to this  Patient Assessment   Assessment and  Interventions   Assessment:  Intractable back pain  Plan: Will increase norco from 7.5mg , 2 tabs q4 to norco 10/325mg  q4/prn Patient might benefit from addition of long acting opiates such as MS contin  or Fentanyl  patch Can consider epidural shot for pain control/ 5.  Consider outpatient TENS unit Can consider neurosurgurgery inpatient consult

## 2024-09-16 NOTE — Plan of Care (Signed)
   Problem: Education: Goal: Knowledge of General Education information will improve Description: Including pain rating scale, medication(s)/side effects and non-pharmacologic comfort measures Outcome: Progressing   Problem: Health Behavior/Discharge Planning: Goal: Ability to manage health-related needs will improve Outcome: Progressing   Problem: Clinical Measurements: Goal: Will remain free from infection Outcome: Progressing

## 2024-09-16 NOTE — Progress Notes (Signed)
 This progress note is account for multiple conversations. Pt called RN around 1 am stating that she is not responding well to her current pain management plan. RN  reviewed that her previous response to other medications including oxy, Voltaren, Toradol  was not very satisfying and even the last dosage of dilaudid  didn't help her much. We also discussed the reasons for prednisone  taper and how it made her awake. Pt stated that initial doses of  dilaudid  has worked better for her. Per RN chart review patient's last dilaudid  dose was 1 mg but previously receiving 2 mg.   On call provider was notified of patients concerns and limitations with the pain regiment. On call increased narco dose.  At 3:30 patient was offered increased dose of narco per new order-pt stated that her pain had subsided to  a score of 5 and would like to wait on her narco med. Overall patient understood that her options are limited at night as her attending physicians are primary management team for her pain.

## 2024-09-16 NOTE — [place] FL2 (Signed)
 [place]  MEDICAID FL2 LEVEL OF CARE FORM     IDENTIFICATION  Patient Name: Angel Riddle Birthdate: July 10, 1956 Sex: female Admission Date (Current Location): 09/13/2024  [place] and [place] Number:  Chiropodist and Address:  [facility], [address] [place], [place] [address]      Provider Number: [id number]  Attending Physician Name and Address:  Lenon Marien CROME, MD  Relative Name and Phone Number:  Patient    Current Level of Care: Hospital Recommended Level of Care: Skilled Nursing Facility Prior Approval Number:    Date Approved/Denied:   PASRR Number:    Discharge Plan: SNF    Current Diagnoses: Patient Active Problem List   Diagnosis Date Noted   Right leg pain 09/13/2024   Bad odor of urine 05/30/2024   Abdominal pain 05/07/2024   History of pulmonary embolus (PE) 04/13/2024   Spondylolisthesis of lumbar region 02/08/2024   Hematuria 11/01/2023   Lumbar foraminal stenosis 07/29/2023   Change in bowel movement 06/19/2023   Low back pain 05/08/2023   Knee pain 04/06/2023   Thumb pain 04/05/2023   Muscle ache 03/29/2023   Acute non-recurrent sinusitis 12/31/2022   B12 deficiency 09/26/2022   Tachycardia 04/06/2022   Dysuria 01/18/2022   Leg pain 05/13/2021   Double vision 03/14/2021   Female bladder prolapse 03/14/2021   Tinnitus 07/05/2020   Chronic left sacroiliac pain 05/21/2020   Chronic pain of left knee 05/21/2020   Bilateral hip pain 05/21/2020   Left elbow pain 05/21/2020   Fatigue 12/30/2019   Weight loss counseling, encounter for 12/30/2019   Breast cancer screening 07/07/2019   Aortic atherosclerosis 02/10/2019   GERD (gastroesophageal reflux disease) 06/20/2018   Chest pain 01/06/2018   Lumbar stenosis with neurogenic claudication 09/15/2017   BMI 31.0-31.9,adult 04/25/2017   Hyperglycemia 04/25/2017   Personal history of tobacco use, presenting hazards to health 12/15/2016   History of  colonic polyps    Anal fissure    Benign neoplasm of sigmoid colon    Left arm pain 08/03/2015   Cough 07/30/2015   Health care maintenance 01/12/2015   Abdominal fullness 03/10/2014   Numbness and tingling 10/02/2013   Hypertension 10/02/2013   Chronic back pain 07/18/2013   Depression, major, recurrent, moderate (HCC) 07/18/2013   Anxiety 07/18/2013   Environmental allergies 07/18/2013   HLD (hyperlipidemia) 07/18/2013   History of colonic polyps 07/18/2013   Allergy to environmental factors 07/18/2013    Orientation RESPIRATION BLADDER Height & Weight     Self, Time, Situation, Place  Normal Continent Weight: 79 kg Height:  5' 5 (165.1 cm)  BEHAVIORAL SYMPTOMS/MOOD NEUROLOGICAL BOWEL NUTRITION STATUS  Other (Comment) (Pt has PMH of   Depression,Panic attacks,)  (N/A) Continent Diet (Diet Heart with thin liquids)  AMBULATORY STATUS COMMUNICATION OF NEEDS Skin   Extensive Assist Verbally Normal                       Personal Care Assistance Level of Assistance  Bathing, Feeding, Dressing, Total care Bathing Assistance: Independent Feeding assistance: Independent Dressing Assistance: Independent Total Care Assistance: Independent   Functional Limitations Info  Sight (Wears Glasses) Sight Info: Adequate        SPECIAL CARE FACTORS FREQUENCY  PT (By licensed PT), OT (By licensed OT)     PT Frequency: 5 x a wk OT Frequency: 5 x a wk            Contractures Contractures Info: Not  present    Additional Factors Info  Code Status Code Status Info: Full             Current Medications (09/16/2024):  This is the current hospital active medication list Current Facility-Administered Medications  Medication Dose Route Frequency Provider Last Rate Last Admin   apixaban  (ELIQUIS ) tablet 5 mg  5 mg Oral BID Khan, Ghalib, MD   5 mg at 09/15/24 2300   diclofenac Sodium (VOLTAREN) 1 % topical gel 2 g  2 g Topical QID Lenon Marien CROME, MD   2 g at 09/15/24  9166   ezetimibe  (ZETIA ) tablet 10 mg  10 mg Oral Daily Khan, Ghalib, MD   10 mg at 09/15/24 0900   FLUoxetine  (PROZAC ) capsule 40 mg  40 mg Oral Daily Khan, Ghalib, MD   40 mg at 09/15/24 0900   HYDROcodone -acetaminophen  (NORCO) 10-325 MG per tablet 2 tablet  2 tablet Oral Q4H PRN Duncan, Hazel V, MD   2 tablet at 09/16/24 0445   lidocaine  (LIDODERM ) 5 % 1 patch  1 patch Transdermal Q24H Poggi, Jenna E, PA-C   1 patch at 09/15/24 1100   lidocaine  (LIDODERM ) 5 % 1 patch  1 patch Transdermal Q24H Lenon Marien CROME, MD   1 patch at 09/15/24 1554   methocarbamol  (ROBAXIN ) injection 500 mg  500 mg Intravenous Q6H Khan, Ghalib, MD   500 mg at 09/16/24 0341   nicotine  (NICODERM CQ  - dosed in mg/24 hours) patch 14 mg  14 mg Transdermal Daily Khan, Ghalib, MD   14 mg at 09/15/24 0900   pantoprazole  (PROTONIX ) EC tablet 40 mg  40 mg Oral QHS Khan, Ghalib, MD   40 mg at 09/15/24 2300   polyethylene glycol (MIRALAX / GLYCOLAX) packet 17 g  17 g Oral BID Lenon Marien CROME, MD   17 g at 09/14/24 0950   potassium chloride  (KLOR-CON  M) CR tablet 10 mEq  10 mEq Oral Daily Lenon Marien CROME, MD   10 mEq at 09/15/24 0900   predniSONE  (DELTASONE ) tablet 20 mg  20 mg Oral BID WC Lenon Marien L, MD   20 mg at 09/15/24 1600   And   [START ON 09/17/2024] predniSONE  (DELTASONE ) tablet 20 mg  20 mg Oral Q breakfast Lenon Marien CROME, MD       pregabalin  (LYRICA ) capsule 200 mg  200 mg Oral BID Anderson, Chelsey L, MD   200 mg at 09/15/24 2300   traZODone (DESYREL) tablet 100 mg  100 mg Oral QHS Lenon Marien CROME, MD   100 mg at 09/15/24 2032     Discharge Medications: Please see discharge summary for a list of discharge medications.  Relevant Imaging Results:  Relevant Lab Results:   Additional Information SS#[id number]  Marinda Cooks, RN

## 2024-09-16 NOTE — Plan of Care (Signed)

## 2024-09-16 NOTE — Progress Notes (Signed)
 PROGRESS NOTE Angel Riddle    DOB: January 05, 1956, 68 y.o.  FMW:[id number]    Code Status: Limited: Do not attempt resuscitation (DNR) -DNR-LIMITED -Do Not Intubate/DNI    DOA: 09/13/2024   LOS: 3  Brief hospital course  Angel Riddle is a 68 y.o. female with a PMH significant for chronic back pain secondary to disc herniation, MDD, hypertension, generalized anxiety disorder, GERD presenting to the ED with worsening back pain and right leg pain.  Last epidural injection about 3 weeks ago. This was second epidural.   Pt had back surgery in March 2025. First back surgery in 2018. Plan for operative management in 12/2024.   ED Course: hemodynamically stable. Hip xray did not show acute findings. Positive for osteoarthritis.   They were initially treated with analgesia.   Patient was admitted to medicine service for further workup and management of acute on chronic back pain as outlined in detail below.  09/16/24 -feels OK today. Had significant pain overnight poorly controlled. Also became anxious because someone told her she was going to discharge her to SNF today.  Assessment & Plan  Principal Problem:   Right leg pain Active Problems:   Depression, major, recurrent, moderate (HCC)   HLD (hyperlipidemia)   Hypertension   GERD (gastroesophageal reflux disease)  Right leg pain secondary to degenerative disc disease- MRI performed on 08/30 showed L5-S1 disc protrusion with impingement of L5 and descending S1 nerve roots.  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   -outpatient regimen: Hydrocodone - #2 7.5mg  several times per day. Increased to 10mg  - IV Dilaudid  added for breakthrough pain. - now adding fentanyl  patch - continue lidocaine  patches -Continue Lyrica - increased to 200mg  BID -continue IV Robaxin . - trial of steroids and antiinflammatories. Steroids are weaning off - topical voltaren and lidocaine  - added on  bowel regimen - PT/OT- recommending SNF. Patient is agreeable to this   Chronic problems: HTN: continue home meds (maxide 37.5-25 mg half tablet daily) HLD; continue home meds (zetia  daily and gets Praluent  q2weeks) GERD: continue home meds (continue home PPI) MDD: Continue home fluoxetine .   Body mass index is 28.98 kg/m.  VTE ppx:  apixaban  (ELIQUIS ) tablet 5 mg   Diet:     Diet   Diet Heart Room service appropriate? Yes; Fluid consistency: Thin   Consultants: None   Subjective 09/16/24    Pt reports feeling OK this morning but had a painful night.  Objective  Blood pressure 126/71, pulse 72, temperature 98.6 F (37 C), resp. rate 18, height 5' 5 (1.651 m), weight 79 kg, SpO2 95%.  Intake/Output Summary (Last 24 hours) at 09/16/2024 0728 Last data filed at 09/15/2024 2025 Gross per 24 hour  Intake 580 ml  Output --  Net 580 ml   Filed Weights   09/13/24 1024  Weight: 79 kg    Physical Exam:  General: awake, alert, NAD HEENT: atraumatic, clear conjunctiva, anicteric sclera, MMM, hearing grossly normal Respiratory: normal respiratory effort. Cardiovascular: extremities well perfused, quick capillary refill, normal S1/S2, RRR, no JVD, murmurs Nervous: A&O x3. no gross focal neurologic deficits, normal speech Extremities: moves all equally, no edema, normal tone Skin: dry, intact, normal temperature, normal color. No rashes, lesions or ulcers on exposed skin Psychiatry: normal mood, congruent affect  Labs   I have personally reviewed the following labs and imaging studies CBC    Component Value Date/Time   WBC 9.7 09/14/2024 0328   RBC 4.49  09/14/2024 0328   HGB 13.2 09/14/2024 0328   HGB 13.2 01/02/2012 1820   HCT 38.1 09/14/2024 0328   HCT 38.2 01/02/2012 1820   PLT 304 09/14/2024 0328   PLT 270 01/02/2012 1820   MCV 84.9 09/14/2024 0328   MCV 86 01/02/2012 1820   MCH 29.4 09/14/2024 0328   MCHC 34.6 09/14/2024 0328   RDW 13.6 09/14/2024 0328    RDW 12.4 01/02/2012 1820   LYMPHSABS 1.0 09/08/2024 1303   MONOABS 0.4 09/08/2024 1303   EOSABS 0.0 09/08/2024 1303   BASOSABS 0.0 09/08/2024 1303      Latest Ref Rng & Units 09/14/2024    3:28 AM 09/13/2024    3:19 PM 09/08/2024    1:03 PM  BMP  Glucose 70 - 99 mg/dL 878  849  94   BUN 8 - 23 mg/dL 17  19  16    Creatinine 0.44 - 1.00 mg/dL 9.20  9.14  9.17   Sodium 135 - 145 mmol/L 138  138  137   Potassium 3.5 - 5.1 mmol/L 3.9  3.3  3.3   Chloride 98 - 111 mmol/L 102  102  102   CO2 22 - 32 mmol/L 25  25  22    Calcium 8.9 - 10.3 mg/dL 9.3  9.2  9.0     No results found.  Disposition Plan & Communication  Patient status: Inpatient  Admitted From: Home Planned disposition location: SNF Anticipated discharge date: TBD pending SNF placement   Family Communication: none at bedside    Author: Marien LITTIE Piety, DO [facility] 09/16/2024, 7:28 AM   Available by Epic secure chat 7AM-7PM. If 7PM-7AM, please contact night-coverage.  [facility] contact information found on [email or web address].

## 2024-09-17 DIAGNOSIS — M79604 Pain in right leg: Secondary | ICD-10-CM | POA: Diagnosis not present

## 2024-09-17 DIAGNOSIS — M5136 Other intervertebral disc degeneration, lumbar region with discogenic back pain only: Secondary | ICD-10-CM | POA: Diagnosis not present

## 2024-09-17 MED ORDER — LIDOCAINE 5 % EX PTCH
1.0000 | MEDICATED_PATCH | CUTANEOUS | Status: AC
  Administered 2024-09-17: 1 via TRANSDERMAL
  Filled 2024-09-17: qty 1

## 2024-09-17 MED ORDER — MELATONIN 5 MG PO TABS
5.0000 mg | ORAL_TABLET | Freq: Every evening | ORAL | Status: DC | PRN
  Administered 2024-09-17: 5 mg via ORAL
  Filled 2024-09-17: qty 1

## 2024-09-17 MED ORDER — HYDROMORPHONE HCL 1 MG/ML IJ SOLN
0.5000 mg | INTRAMUSCULAR | Status: AC | PRN
Start: 2024-09-17 — End: 2024-09-17
  Administered 2024-09-17 (×2): 0.5 mg via INTRAVENOUS
  Filled 2024-09-17 (×3): qty 0.5

## 2024-09-17 MED ORDER — DICLOFENAC SODIUM 1 % EX GEL: 2.0000 g | Freq: Four times a day (QID) | CUTANEOUS | Status: DC

## 2024-09-17 NOTE — Progress Notes (Signed)
 OT Cancellation Note  Patient Details Name: Angel Riddle MRN: [id number] DOB: 18-Nov-1956   Cancelled Treatment:    Reason Eval/Treat Not Completed: Pain limiting ability to participate. Pt received in bed, pain meds received recently with pt stating 8/10 pain at rest. Pt politely declines OT intervention at this time and requests author check back in later. OT will re-attempt as able.  Kazzandra Desaulniers L. Kanon Novosel, OTR/L  09/17/24, 11:12 AM

## 2024-09-17 NOTE — Progress Notes (Signed)
       CROSS COVER NOTE  NAME: Angel Riddle MRN: [id number] DOB : [birth year]    Concern as stated by nurse / staff   Pt admitted for R leg pain. Pt is requesting for xanax  to help her sleep tonight, she has it last night and she says it works. Trazodone doesn't help per pt and gives her weird dreams. FYI      Pertinent findings on chart review:   Patient Assessment   Assessment and  Interventions   Assessment:  Insomnia Chronic opiates  Plan: Melatonin-->ambien  Primary attending in the am to address ongoing meds for insomnia X

## 2024-09-17 NOTE — Care Management Important Message (Signed)
 Important Message  Patient Details  Name: Angel Riddle MRN: [id number] Date of Birth: 12-Jan-1956   Important Message Given:  Yes - Medicare IM     [name], CMA 09/17/2024, 1:43 PM

## 2024-09-17 NOTE — Progress Notes (Signed)
 Mobility Specialist - Progress Note   09/17/24 1518  Mobility  Activity Ambulated with assistance  Level of Assistance Contact guard assist, steadying assist  Assistive Device Front wheel walker  Distance Ambulated (ft) 80 ft  Activity Response Tolerated well  Mobility visit 1 Mobility  Mobility Specialist Start Time (ACUTE ONLY) 1456  Mobility Specialist Stop Time (ACUTE ONLY) 1513  Mobility Specialist Time Calculation (min) (ACUTE ONLY) 17 min   Pt supine upon entry, motivated and agreeable to OOB amb this date. Pt amb a self selected 80 ft in the hallway CGA-MinG, tolerated well. Pt expressed min pain upon return to the room, however expressed less pain than this morning. Pt left supine with needs within reach.  America Silvan Mobility Specialist 09/17/24 4:23 PM

## 2024-09-17 NOTE — Progress Notes (Signed)
 Physical Therapy Treatment Patient Details Name: Angel Riddle MRN: [id number] DOB: 25-May-1956 Today's Date: 09/17/2024   History of Present Illness Pt is a 68 y/o F presenting to ED with c/o worsening back and R leg pain. MD assessment includes right leg pain secondary to degenerative disc disease. PMH significant for chronic back pain, MDD, HTN, GAD, GERD.    PT Comments  Pt was pleasant and motivated to participate during the session and put forth good effort throughout. Pt training provided on proper sequencing with log rolling and transfers to limit lumbar flexion and minimize back and RLE pain.  Pt required cuing and min A with log rolling but only cuing during multiple sit to/from stand transfers at the EOB.  Upon coming to initial standing pt self-selected to lift her R foot from the floor due to RLE pain with weight bearing activities.  After 5-10 sec pt able to place her R foot on the floor with step-to gait pattern education provided to address RLE pain with WB.  Pt was able to amb with that pattern around 15 feet and did endorse some relief in pain compared to reciprocal pattern.  Pt will benefit from continued PT services upon discharge to safely address deficits listed in patient problem list for decreased caregiver assistance and eventual return to PLOF.      If plan is discharge home, recommend the following: A little help with walking and/or transfers;A little help with bathing/dressing/bathroom;Assist for transportation   Can travel by private vehicle     Yes  Equipment Recommendations  Other (comment) (TBD at next venue of care)    Recommendations for Other Services       Precautions / Restrictions Precautions Precautions: Fall Recall of Precautions/Restrictions: Intact Restrictions Weight Bearing Restrictions Per Provider Order: No     Mobility  Bed Mobility Overal bed mobility: Needs Assistance Bed Mobility: Rolling, Sit to Sidelying, Sidelying to  Sit Rolling: Supervision Sidelying to sit: Min assist     Sit to sidelying: Min assist General bed mobility comments: Mod verbal cues for log roll sequencing with min A for BLE and trunk control    Transfers Overall transfer level: Needs assistance Equipment used: Rolling walker (2 wheels) Transfers: Sit to/from Stand Sit to Stand: Contact guard assist           General transfer comment: Min to mod verbal cues for sequencing to avoid lumbar flexion    Ambulation/Gait Ambulation/Gait assistance: Contact guard assist Gait Distance (Feet): 15 Feet Assistive device: Rolling walker (2 wheels) Gait Pattern/deviations: Decreased stance time - right, Step-to pattern, Decreased step length - left, Decreased weight shift to right Gait velocity: decreased     General Gait Details: Slow cadence with cues for step-to sequencing to address RLE pain with WB; pt generally steady with no overt LOB but ambulated limited distance due to pain   Stairs             Wheelchair Mobility     Tilt Bed    Modified Rankin (Stroke Patients Only)       Balance Overall balance assessment: Needs assistance Sitting-balance support: Feet supported Sitting balance-Leahy Scale: Good     Standing balance support: Bilateral upper extremity supported, During functional activity, Reliant on assistive device for balance Standing balance-Leahy Scale: Good                              Communication Communication Communication: No apparent difficulties  Cognition Arousal: Alert Behavior During Therapy: WFL for tasks assessed/performed   PT - Cognitive impairments: No apparent impairments                         Following commands: Intact      Cueing Cueing Techniques: Verbal cues, Tactile cues, Visual cues  Exercises Other Exercises Other Exercises: Log roll and transfer training to address lumbar back pain    General Comments        Pertinent Vitals/Pain  Pain Assessment Pain Assessment: 0-10 Pain Score: 4  Pain Location: RLE at rest, increases with activity Pain Descriptors / Indicators: Sore, Radiating Pain Intervention(s): Premedicated before session, Repositioned, Monitored during session    Home Living                          Prior Function            PT Goals (current goals can now be found in the care plan section) Progress towards PT goals: Progressing toward goals    Frequency    Min 2X/week      PT Plan      Co-evaluation              AM-PAC PT 6 Clicks Mobility   Outcome Measure  Help needed turning from your back to your side while in a flat bed without using bedrails?: None Help needed moving from lying on your back to sitting on the side of a flat bed without using bedrails?: A Little Help needed moving to and from a bed to a chair (including a wheelchair)?: A Little Help needed standing up from a chair using your arms (e.g., wheelchair or bedside chair)?: A Little Help needed to walk in hospital room?: A Little Help needed climbing 3-5 steps with a railing? : A Little 6 Click Score: 19    End of Session Equipment Utilized During Treatment: Gait belt Activity Tolerance: Patient limited by pain Patient left: in bed;with call bell/phone within reach;with bed alarm set;with family/visitor present Nurse Communication: Mobility status PT Visit Diagnosis: Unsteadiness on feet (R26.81);Muscle weakness (generalized) (M62.81);Difficulty in walking, not elsewhere classified (R26.2);Pain Pain - Right/Left: Right Pain - part of body: Leg     Time: 8481-8458 PT Time Calculation (min) (ACUTE ONLY): 23 min  Charges:    $Gait Training: 8-22 mins $Therapeutic Activity: 8-22 mins PT General Charges $$ ACUTE PT VISIT: 1 Visit                     D. Scott Kyoko Elsea PT, DPT 09/17/24, 3:56 PM

## 2024-09-17 NOTE — Plan of Care (Signed)

## 2024-09-17 NOTE — Plan of Care (Signed)
  Problem: Education: Goal: Knowledge of General Education information will improve Description: Including pain rating scale, medication(s)/side effects and non-pharmacologic comfort measures Outcome: Progressing   Problem: Clinical Measurements: Goal: Ability to maintain clinical measurements within normal limits will improve Outcome: Progressing   Problem: Coping: Goal: Level of anxiety will decrease Outcome: Progressing   Problem: Pain Managment: Goal: General experience of comfort will improve and/or be controlled Outcome: Progressing   Problem: Safety: Goal: Ability to remain free from injury will improve Outcome: Progressing   Problem: Skin Integrity: Goal: Risk for impaired skin integrity will decrease Outcome: Progressing

## 2024-09-17 NOTE — Progress Notes (Signed)
 PROGRESS NOTE Angel Riddle    DOB: Jun 15, 1956, 68 y.o.  FMW:[id number]    Code Status: Limited: Do not attempt resuscitation (DNR) -DNR-LIMITED -Do Not Intubate/DNI    DOA: 09/13/2024   LOS: 4  Brief hospital course  Angel Riddle is a 68 y.o. female with a PMH significant for chronic back pain secondary to disc herniation, MDD, hypertension, generalized anxiety disorder, GERD, PE in June 2025 on eliquis , presenting to the ED with worsening back pain and right leg pain.  Last epidural injection about 3 weeks ago. This was second epidural.   Pt had back surgery in March 2025. First back surgery in 2018. Plan for operative management in 12/2024.   ED Course: hemodynamically stable. Hip xray did not show acute findings. Positive for osteoarthritis.   They were initially treated with analgesia.   Patient was admitted to medicine service for further workup and management of acute on chronic back pain as outlined in detail below.  09/17/24 -despite significant increases to pain management, still poorly controlled. IR consulted to evaluate for possible injection  Assessment & Plan  Principal Problem:   Right leg pain Active Problems:   Depression, major, recurrent, moderate (HCC)   HLD (hyperlipidemia)   Hypertension   GERD (gastroesophageal reflux disease)  Right leg pain secondary to degenerative disc disease with herniation- MRI performed on 08/30 showed L5-S1 disc protrusion with impingement of L5 and descending S1 nerve roots.  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   - tentatively scheduled outpatient for further surgical intervention which is delayed due to being on DOAC for PE -outpatient regimen: Hydrocodone - #2 7.5mg  several times per day. Increased to 10mg  - IV Dilaudid  added for breakthrough pain. - now adding fentanyl  patch - continue lidocaine  patches -Continue Lyrica - increased to 200mg  BID -continue IV  Robaxin . - trial of steroids and antiinflammatories. Steroids are weaning off - topical voltaren and lidocaine  - added on bowel regimen - PT/OT- recommending SNF. Patient is agreeable to this - IR has been consulted to evaluate for possible epidural injection. Eliquis  will be held while awaiting availability for this procedure.    Chronic problems: HTN: continue home meds (maxide 37.5-25 mg half tablet daily) HLD; continue home meds (zetia  daily and gets Praluent  q2weeks) GERD: continue home meds (continue home PPI) MDD: Continue home fluoxetine .  PE: eliquis  held. Last dose morning of 10/13  Body mass index is 28.98 kg/m.  VTE ppx:  apixaban  (ELIQUIS ) tablet 5 mg   Diet:     Diet   Diet Heart Room service appropriate? Yes; Fluid consistency: Thin   Consultants: IR  Subjective 09/17/24    Pt reports significant pain this morning poorly controlled with current regimen.   Objective  Blood pressure 126/71, pulse 72, temperature 98.6 F (37 C), resp. rate 18, height 5' 5 (1.651 m), weight 79 kg, SpO2 95%.  Intake/Output Summary (Last 24 hours) at 09/17/2024 0719 Last data filed at 09/17/2024 0150 Gross per 24 hour  Intake 1200 ml  Output --  Net 1200 ml   Filed Weights   09/13/24 1024  Weight: 79 kg    Physical Exam:  General: awake, alert, NAD HEENT: atraumatic, clear conjunctiva, anicteric sclera, MMM, hearing grossly normal Respiratory: normal respiratory effort. Cardiovascular: extremities well perfused, quick capillary refill, normal S1/S2, RRR, no JVD, murmurs Nervous: A&O x3. no gross focal neurologic deficits, normal speech Extremities: moves all equally, no edema, normal tone Skin: dry, intact, normal  temperature, normal color. No rashes, lesions or ulcers on exposed skin Psychiatry: normal mood, congruent affect  Labs   I have personally reviewed the following labs and imaging studies CBC    Component Value Date/Time   WBC 9.7 09/14/2024 0328    RBC 4.49 09/14/2024 0328   HGB 13.2 09/14/2024 0328   HGB 13.2 01/02/2012 1820   HCT 38.1 09/14/2024 0328   HCT 38.2 01/02/2012 1820   PLT 304 09/14/2024 0328   PLT 270 01/02/2012 1820   MCV 84.9 09/14/2024 0328   MCV 86 01/02/2012 1820   MCH 29.4 09/14/2024 0328   MCHC 34.6 09/14/2024 0328   RDW 13.6 09/14/2024 0328   RDW 12.4 01/02/2012 1820   LYMPHSABS 1.0 09/08/2024 1303   MONOABS 0.4 09/08/2024 1303   EOSABS 0.0 09/08/2024 1303   BASOSABS 0.0 09/08/2024 1303      Latest Ref Rng & Units 09/14/2024    3:28 AM 09/13/2024    3:19 PM 09/08/2024    1:03 PM  BMP  Glucose 70 - 99 mg/dL 878  849  94   BUN 8 - 23 mg/dL 17  19  16    Creatinine 0.44 - 1.00 mg/dL 9.20  9.14  9.17   Sodium 135 - 145 mmol/L 138  138  137   Potassium 3.5 - 5.1 mmol/L 3.9  3.3  3.3   Chloride 98 - 111 mmol/L 102  102  102   CO2 22 - 32 mmol/L 25  25  22    Calcium 8.9 - 10.3 mg/dL 9.3  9.2  9.0     No results found.  Disposition Plan & Communication  Patient status: Inpatient  Admitted From: Home Planned disposition location: SNF Anticipated discharge date: TBD pending SNF placement   Family Communication: none at bedside    Author: Marien LITTIE Piety, DO [facility] 09/17/2024, 7:19 AM   Available by Epic secure chat 7AM-7PM. If 7PM-7AM, please contact night-coverage.  [facility] contact information found on [email or web address].

## 2024-09-18 DIAGNOSIS — M79604 Pain in right leg: Secondary | ICD-10-CM | POA: Diagnosis not present

## 2024-09-18 DIAGNOSIS — M5136 Other intervertebral disc degeneration, lumbar region with discogenic back pain only: Secondary | ICD-10-CM | POA: Diagnosis not present

## 2024-09-18 MED ORDER — LORAZEPAM 0.5 MG PO TABS
0.5000 mg | ORAL_TABLET | Freq: Four times a day (QID) | ORAL | Status: DC | PRN
Start: 2024-09-18 — End: 2024-09-25
  Administered 2024-09-18: 0.5 mg via ORAL
  Filled 2024-09-18: qty 1

## 2024-09-18 MED ORDER — LIDOCAINE 5 % EX PTCH
2.0000 | MEDICATED_PATCH | CUTANEOUS | Status: DC
  Administered 2024-09-19 – 2024-09-25 (×7): 2 via TRANSDERMAL
  Filled 2024-09-18 (×7): qty 2

## 2024-09-18 MED ORDER — APIXABAN 5 MG PO TABS
5.0000 mg | ORAL_TABLET | Freq: Two times a day (BID) | ORAL | Status: AC
  Administered 2024-09-18 (×2): 5 mg via ORAL
  Filled 2024-09-18 (×2): qty 1

## 2024-09-18 MED ORDER — METHOCARBAMOL 500 MG PO TABS
500.0000 mg | ORAL_TABLET | Freq: Three times a day (TID) | ORAL | Status: DC
  Administered 2024-09-18 – 2024-09-25 (×20): 500 mg via ORAL
  Filled 2024-09-18 (×22): qty 1

## 2024-09-18 MED ORDER — HYDROMORPHONE HCL 1 MG/ML IJ SOLN
0.5000 mg | INTRAMUSCULAR | Status: AC | PRN
Start: 2024-09-18 — End: ?
  Administered 2024-09-18 – 2024-09-21 (×2): 0.5 mg via INTRAVENOUS
  Filled 2024-09-18 (×2): qty 0.5

## 2024-09-18 NOTE — Progress Notes (Signed)
 PROGRESS NOTE Angel Riddle    DOB: 1956/05/09, 68 y.o.  FMW:[id number]    Code Status: Limited: Do not attempt resuscitation (DNR) -DNR-LIMITED -Do Not Intubate/DNI    DOA: 09/13/2024   LOS: 5  Brief hospital course  Angel Riddle is a 68 y.o. female with a PMH significant for chronic back pain secondary to disc herniation, MDD, hypertension, generalized anxiety disorder, GERD, PE in June 2025 on eliquis , presenting to the ED with worsening back pain and right leg pain. Last epidural injection about 3 weeks ago. This was second epidural.   Pt had back surgery in March 2025. First back surgery in 2018. Plan for further operative management in 12/2024.   ED Course: hemodynamically stable. Hip xray did not show acute findings. Positive for osteoarthritis.  They were initially treated with analgesia.   Patient was admitted to medicine service for further workup and management of acute on chronic back pain with radiculopathy as outlined in detail below.  09/18/24 -endorses some improvement today in pain.   Assessment & Plan  Principal Problem:   Right leg pain Active Problems:   Depression, major, recurrent, moderate (HCC)   HLD (hyperlipidemia)   Hypertension   GERD (gastroesophageal reflux disease)  Back pain with radiculopathy and right leg pain secondary to degenerative disc disease with herniation- MRI performed on 08/30 showed L5-S1 disc protrusion with impingement of L5 and descending S1 nerve roots.  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   - tentatively scheduled outpatient for further surgical intervention which is delayed due to being on DOAC for PE -outpatient regimen: Hydrocodone - #2 7.5mg  several times per day. Increased to 10mg  - IV Dilaudid  added for breakthrough pain. - added fentanyl  patch 25mcg which seems to have helped - continue lidocaine  patches -Continue Lyrica - increased to 200mg  BID -continue  Robaxin - transitioned from IV to PO - trial of steroids and antiinflammatories. Steroids are weaning off - topical voltaren and lidocaine  - added on bowel regimen - PT/OT- recommending SNF. Patient is agreeable to this - IR has been consulted to evaluate for possible epidural injection. Tentatively scheduled for Friday. Eliquis  will be held 48hr prior to this procedure.    Chronic problems: HTN: continue home meds (maxide 37.5-25 mg half tablet daily) HLD; continue home meds (zetia  every other day and Praluent  q2weeks) GERD: continue home meds (continue home PPI) MDD: Continue home fluoxetine .  PE: eliquis  held. Last dose morning of 10/13  Body mass index is 28.98 kg/m.  VTE ppx:  apixaban  (ELIQUIS ) tablet 5 mg   Diet:     Diet   Diet Heart Room service appropriate? Yes; Fluid consistency: Thin   Consultants: IR  Subjective 09/18/24    Pt reports improved pain today. She is agreeable to injection and dc. Concerned about wetting the bed overnight. Recommended empty bladder before bed and avoid late night drinks.   Objective  Blood pressure 126/71, pulse 72, temperature 98.6 F (37 C), resp. rate 18, height 5' 5 (1.651 m), weight 79 kg, SpO2 95%.  Intake/Output Summary (Last 24 hours) at 09/18/2024 0736 Last data filed at 09/18/2024 9372 Gross per 24 hour  Intake 840 ml  Output --  Net 840 ml   Filed Weights   09/13/24 1024  Weight: 79 kg    Physical Exam:  General: awake, alert, NAD HEENT: atraumatic, clear conjunctiva, anicteric sclera, MMM, hearing grossly normal Respiratory: normal respiratory effort. Cardiovascular: extremities well perfused, quick capillary refill, normal  S1/S2, RRR, no JVD, murmurs Nervous: A&O x3. no gross focal neurologic deficits, normal speech Extremities: moves all equally, no edema, normal tone. Minimal joint effusion on R knee without redness, increased warmth. Tender to palpation  Skin: dry, intact, normal temperature, normal color.  No rashes, lesions or ulcers on exposed skin Psychiatry: normal mood, congruent affect  Labs   I have personally reviewed the following labs and imaging studies CBC    Component Value Date/Time   WBC 9.7 09/14/2024 0328   RBC 4.49 09/14/2024 0328   HGB 13.2 09/14/2024 0328   HGB 13.2 01/02/2012 1820   HCT 38.1 09/14/2024 0328   HCT 38.2 01/02/2012 1820   PLT 304 09/14/2024 0328   PLT 270 01/02/2012 1820   MCV 84.9 09/14/2024 0328   MCV 86 01/02/2012 1820   MCH 29.4 09/14/2024 0328   MCHC 34.6 09/14/2024 0328   RDW 13.6 09/14/2024 0328   RDW 12.4 01/02/2012 1820   LYMPHSABS 1.0 09/08/2024 1303   MONOABS 0.4 09/08/2024 1303   EOSABS 0.0 09/08/2024 1303   BASOSABS 0.0 09/08/2024 1303      Latest Ref Rng & Units 09/14/2024    3:28 AM 09/13/2024    3:19 PM 09/08/2024    1:03 PM  BMP  Glucose 70 - 99 mg/dL 878  849  94   BUN 8 - 23 mg/dL 17  19  16    Creatinine 0.44 - 1.00 mg/dL 9.20  9.14  9.17   Sodium 135 - 145 mmol/L 138  138  137   Potassium 3.5 - 5.1 mmol/L 3.9  3.3  3.3   Chloride 98 - 111 mmol/L 102  102  102   CO2 22 - 32 mmol/L 25  25  22    Calcium 8.9 - 10.3 mg/dL 9.3  9.2  9.0     No results found.  Disposition Plan & Communication  Patient status: Inpatient  Admitted From: Home Planned disposition location: SNF Anticipated discharge date: TBD pending SNF placement   Family Communication: daughter and sister at bedside    Author: Marien LITTIE Piety, DO [facility] 09/18/2024, 7:36 AM   Available by Epic secure chat 7AM-7PM. If 7PM-7AM, please contact night-coverage.  [facility] contact information found on [email or web address].

## 2024-09-18 NOTE — Progress Notes (Signed)
 Informed Anderson MD about pt's complain of unrelieved pain in her right lower leg. Per assessment pt's knee joint area is inflamed no obvious different from yesterday. Both legs are warm but thinking it is form kpat that pt has. Pulses are palpable.  Gave pt one dose of Dilaudid .   MD ordered ativan  X1.

## 2024-09-18 NOTE — TOC Progression Note (Signed)
 Transition of Care [facility]) - Progression Note    Patient Details  Name: Angel Riddle MRN: [id number] Date of Birth: 1955-12-10  [facility]) CM/SW Contact  Alvaro Louder, [place] Phone Number: 09/18/2024, 4:16 PM  Clinical Narrative:  [facility] selected [facility]. LCSWA to start insurance auth for patient to admit to [facility] when close to medical readiness.   TOC to follow for discharge                       Expected Discharge Plan and Services                                               Social Drivers of Health (SDOH) Interventions SDOH Screenings   Food Insecurity: No Food Insecurity (09/13/2024)  Housing: Low Risk  (09/13/2024)  Transportation Needs: No Transportation Needs (09/13/2024)  Utilities: Not At Risk (09/13/2024)  Alcohol Screen: Low Risk  (06/23/2023)  Depression (PHQ2-9): High Risk (09/07/2024)  Financial Resource Strain: Low Risk  (09/02/2024)   Received from [facility]  Physical Activity: Unknown (06/23/2023)  Social Connections: Moderately Integrated (09/13/2024)  Stress: No Stress Concern Present (06/23/2023)  Tobacco Use: High Risk (09/13/2024)    Readmission Risk Interventions     No data to display

## 2024-09-18 NOTE — Plan of Care (Addendum)
  Problem: Education: Goal: Knowledge of General Education information will improve Description: Including pain rating scale, medication(s)/side effects and non-pharmacologic comfort measures Outcome: Progressing   Problem: Clinical Measurements: Goal: Ability to maintain clinical measurements within normal limits will improve Outcome: Progressing   Problem: Nutrition: Goal: Adequate nutrition will be maintained Outcome: Progressing   Problem: Pain Managment: Goal: General experience of comfort will improve and/or be controlled Outcome: Progressing   Problem: Safety: Goal: Ability to remain free from injury will improve Outcome: Progressing   Problem: Skin Integrity: Goal: Risk for impaired skin integrity will decrease Outcome: Progressing

## 2024-09-18 NOTE — Plan of Care (Signed)

## 2024-09-18 NOTE — Progress Notes (Signed)
 Occupational Therapy Treatment Patient Details Name: Angel Riddle MRN: [id number] DOB: 16-Apr-1956 Today's Date: 09/18/2024   History of present illness Pt is a 68 y/o F presenting to ED with c/o worsening back and R leg pain. MD assessment includes right leg pain secondary to degenerative disc disease. PMH significant for chronic back pain, MDD, HTN, GAD, GERD.   OT comments  Chart reviewed to date, pt greeted semi supine in bed, agreeable to OT tx session targeting improving functional activity tolerance in prep for ADL tasks. Pt continues to report limited mobility due to pain. Pt also reports continued RLE pain, redness noted along patella, discomfort to touch on lateral R calf. Notified nurse/MD. Pt is left in chair, all needs met. OT will follow.       If plan is discharge home, recommend the following:  A little help with walking and/or transfers;A little help with bathing/dressing/bathroom   Equipment Recommendations  BSC/3in1    Recommendations for Other Services      Precautions / Restrictions Precautions Precautions: Fall Recall of Precautions/Restrictions: Intact Restrictions Weight Bearing Restrictions Per Provider Order: No       Mobility Bed Mobility Overal bed mobility: Needs Assistance Bed Mobility: Supine to Sit     Supine to sit: Supervision, HOB elevated          Transfers Overall transfer level: Needs assistance Equipment used: Rolling walker (2 wheels) Transfers: Sit to/from Stand Sit to Stand: Supervision, Contact guard assist                 Balance Overall balance assessment: Needs assistance Sitting-balance support: Feet supported Sitting balance-Leahy Scale: Good     Standing balance support: Bilateral upper extremity supported, During functional activity, Reliant on assistive device for balance Standing balance-Leahy Scale: Good                             ADL either performed or assessed with clinical judgement    ADL Overall ADL's : Needs assistance/impaired     Grooming: Contact guard assist;Standing Grooming Details (indicate cue type and reason): with RW                 Toilet Transfer: Contact guard assist;Rolling walker (2 wheels);Regular Toilet;Cueing for sequencing;Grab bars   Toileting- Clothing Manipulation and Hygiene: Contact guard assist;Sitting/lateral lean       Functional mobility during ADLs: Supervision/safety;Contact guard assist;Rolling walker (2 wheels) (approx 20' two attempts)      Extremity/Trunk Assessment              Vision       Perception     Praxis     Communication Communication Communication: No apparent difficulties   Cognition Arousal: Alert Behavior During Therapy: WFL for tasks assessed/performed Cognition: No apparent impairments                               Following commands: Intact        Cueing   Cueing Techniques: Verbal cues, Tactile cues, Visual cues  Exercises Other Exercises Other Exercises: edu re role of OT, role of rehab, discharge recommendations    Shoulder Instructions       General Comments R lower leg with swelling, redness noted in knee area and calf pain noted per her report notified nurse/MD    Pertinent Vitals/ Pain       Pain Assessment Pain  Assessment: Faces Faces Pain Scale: Hurts little more Pain Location: R calf, increased with activity , and with palpation along lateral calf Pain Descriptors / Indicators: Discomfort, Sharp Pain Intervention(s): Premedicated before session, Monitored during session, Repositioned (notified nurse/MD)  Home Living                                          Prior Functioning/Environment              Frequency  Min 2X/week        Progress Toward Goals  OT Goals(current goals can now be found in the care plan section)  Progress towards OT goals: Progressing toward goals  Acute Rehab OT Goals Time For Goal  Achievement: 09/28/24  Plan      Co-evaluation                 AM-PAC OT 6 Clicks Daily Activity     Outcome Measure   Help from another person eating meals?: None Help from another person taking care of personal grooming?: None Help from another person toileting, which includes using toliet, bedpan, or urinal?: A Little Help from another person bathing (including washing, rinsing, drying)?: A Little Help from another person to put on and taking off regular upper body clothing?: None Help from another person to put on and taking off regular lower body clothing?: A Little 6 Click Score: 21    End of Session Equipment Utilized During Treatment: Rolling walker (2 wheels)  OT Visit Diagnosis: Unsteadiness on feet (R26.81)   Activity Tolerance Patient tolerated treatment well   Patient Left in chair;with call bell/phone within reach   Nurse Communication Mobility status        Time: 1140-1200 OT Time Calculation (min): 20 min  Charges: OT General Charges $OT Visit: 1 Visit OT Treatments $Therapeutic Activity: 8-22 mins  Therisa Sheffield, OTD OTR/L  09/18/24, 12:22 PM

## 2024-09-19 DIAGNOSIS — M79604 Pain in right leg: Secondary | ICD-10-CM | POA: Diagnosis not present

## 2024-09-19 NOTE — Plan of Care (Signed)

## 2024-09-19 NOTE — Progress Notes (Signed)
 PROGRESS NOTE    Angel Riddle   FMW:[id number] DOB: Apr 11, 1956  DOA: 09/13/2024 Date of Service: 09/19/24 which is hospital day 6  PCP: [name], MD    Hospital course / significant events:   HPI: Angel Riddle is a 68 y.o. female with a PMH significant for chronic back pain secondary to disc herniation, MDD, hypertension, generalized anxiety disorder, GERD, PE in June 2025 on eliquis , presenting to the ED with worsening back pain and right leg pain. Last epidural injection about 3 weeks ago. This was second epidural.  Pt had back surgery in March 2025. First back surgery in 2018. Plan for further operative management in 12/2024. [name] is concerned about patient's mobility and her safety given she lives alone.  She does not report any injuries or falls. States has been getting worse over the last 2 months   10/09: to ED. Given she had a recent ED visit, [facility] was consulted for admission. No lab work or imaging done in the ED.  10/10: trial steroids, antiinflammatories, muscle relaxer, voltaren, lidocaine  topical, Lyrica . Qualifies for SNF rehab 10/11-10/12: stable thru weekend. Increased meds see order hx 10/13: significant increases to pain management, still poorly controlled. IR consulted to evaluate for possible injection  10/14: pain some improvement. Pending SNF rehab auth  10/15: pt ambulated well today, awaiting injection Friday 10/17     Consultants:  Palliative care - no formal consult   Procedures/Surgeries: None       ASSESSMENT & PLAN:   Back pain with radiculopathy and right leg pain secondary to degenerative disc disease with herniation MRI performed on 08/30 showed L5-S1 disc protrusion with impingement of L5 and descending S1 nerve roots, resultant severe right lateral recess stenosis and right worse than left L5 foraminal narrowing.   Has been tentatively scheduled outpatient for further surgical intervention which is delayed due to  being on DOAC for PE Hydrocodone -APAP 10-325 mg 2 tabs q4h prn severe pain  IV Dilaudid  added for breakthrough pain. fentanyl  patch 25mcg  lidocaine  patches Lyrica  200mg  BID Robaxin  500 mg TID Steroid tapering topical voltaren  Topical lidocaine  patches  bowel regimen PT/OT- recommending SNF. Patient is agreeable to this IR injection L5 S1 tentative scheduled for Friday. Eliquis  will be held 48hr prior to this procedure.    Chronic conditions: HTN: continue home meds (maxide 37.5-25 mg half tablet daily) HLD; continue home meds (zetia  every other day and Praluent  q2weeks) GERD: continue home meds (PPI) MDD: Continue home fluoxetine .  PE: eliquis  held. Last dose morning of 10/13 Anxiety: ativan  prn Insomnia: trazodone 100 mg at bedtime     overweight based on BMI: Body mass index is 28.98 kg/m.[name] Significantly low or high BMI is associated with higher medical risk.  Underweight - under 18  overweight - 25 to 29 obese - 30 or more Class 1 obesity: BMI of 30.0 to 34 Class 2 obesity: BMI of 35.0 to 39 Class 3 obesity: BMI of 40.0 to 49 Super Morbid Obesity: BMI 50-59 Super-super Morbid Obesity: BMI 60+ Healthy nutrition and physical activity advised as adjunct to other disease management and risk reduction treatments    DVT prophylaxis: eliquis  --> HELD for upcoming injection IV fluids: no continuous IV fluids  Nutrition: regular Central lines / other devices: none  Code Status: DNR ACP documentation reviewed:  none on file in [facility]  [facility] needs: SNF rehab Medical barriers to dispo: injection on Friday. Expected medical readiness for discharge once injection done / pending clinical  course.              Subjective / Brief ROS:  Patient reports better today, walked w/ assistance but pain recurred quickly Denies CP/SOB.  Pain controlled at this time  Denies new weakness.  Tolerating diet.    Family Communication: requests call to daughter, will call later  today     Objective Findings:  Vitals:   09/18/24 1930 09/19/24 0424 09/19/24 0823 09/19/24 1424  BP: 115/77 128/79 (!) 147/87 130/68  Pulse: 98 97 87 (!) 105  Resp: 18 18 17 16   Temp: 98.4 F (36.9 C) 98.3 F (36.8 C) 97.9 F (36.6 C) 98.5 F (36.9 C)  TempSrc:    Oral  SpO2: 95% 96% 94% 100%  Weight:      Height:        Intake/Output Summary (Last 24 hours) at 09/19/2024 1718 Last data filed at 09/19/2024 1300 Gross per 24 hour  Intake 480 ml  Output --  Net 480 ml   Filed Weights   09/13/24 1024  Weight: 79 kg    Examination:  Physical Exam       Scheduled Medications:   ezetimibe   10 mg Oral Q M,W,F   fentaNYL   1 patch Transdermal Q72H   FLUoxetine   40 mg Oral Daily   lidocaine   2 patch Transdermal Q24H   methocarbamol   500 mg Oral TID   nicotine   14 mg Transdermal Daily   pantoprazole   40 mg Oral QHS   polyethylene glycol  17 g Oral BID   potassium chloride   10 mEq Oral Daily   predniSONE   20 mg Oral Q breakfast   pregabalin   200 mg Oral BID   traZODone  100 mg Oral QHS    Continuous Infusions:   PRN Medications:  HYDROcodone -acetaminophen , HYDROmorphone  (DILAUDID ) injection, LORazepam , melatonin, mouth rinse  Antimicrobials from admission:  Anti-infectives (From admission, onward)    None           Data Reviewed:  I have personally reviewed the following...  CBC: Recent Labs  Lab 09/13/24 1519 09/14/24 0328  WBC 9.7 9.7  HGB 13.2 13.2  HCT 38.4 38.1  MCV 85.3 84.9  PLT 314 304   Basic Metabolic Panel: Recent Labs  Lab 09/13/24 1519 09/14/24 0328  NA 138 138  K 3.3* 3.9  CL 102 102  CO2 25 25  GLUCOSE 150* 121*  BUN 19 17  CREATININE 0.85 0.79  CALCIUM 9.2 9.3   GFR: Estimated Creatinine Clearance: 69.9 mL/min (by C-G formula based on SCr of 0.79 mg/dL). Liver Function Tests: No results for input(s): AST, ALT, ALKPHOS, BILITOT, PROT, ALBUMIN  in the last 168 hours. No results for input(s):  LIPASE, AMYLASE in the last 168 hours. No results for input(s): AMMONIA in the last 168 hours. Coagulation Profile: Recent Labs  Lab 09/13/24 1355  INR 1.0   Cardiac Enzymes: No results for input(s): CKTOTAL, CKMB, CKMBINDEX, TROPONINI in the last 168 hours. BNP (last 3 results) No results for input(s): PROBNP in the last 8760 hours. HbA1C: No results for input(s): HGBA1C in the last 72 hours. CBG: No results for input(s): GLUCAP in the last 168 hours. Lipid Profile: No results for input(s): CHOL, HDL, LDLCALC, TRIG, CHOLHDL, LDLDIRECT in the last 72 hours. Thyroid  Function Tests: No results for input(s): TSH, T4TOTAL, FREET4, T3FREE, THYROIDAB in the last 72 hours. Anemia Panel: No results for input(s): VITAMINB12, FOLATE, FERRITIN, TIBC, IRON, RETICCTPCT in the last 72 hours. Most Recent Urinalysis On File:  Component Value Date/Time   COLORURINE YELLOW (A) 09/08/2024 1303   APPEARANCEUR CLOUDY (A) 09/08/2024 1303   APPEARANCEUR Clear 09/07/2024 1442   LABSPEC 1.013 09/08/2024 1303   LABSPEC 1.008 01/02/2012 1820   PHURINE 7.0 09/08/2024 1303   GLUCOSEU NEGATIVE 09/08/2024 1303   GLUCOSEU NEGATIVE 05/30/2024 1444   HGBUR NEGATIVE 09/08/2024 1303   BILIRUBINUR NEGATIVE 09/08/2024 1303   BILIRUBINUR Negative 09/07/2024 1442   BILIRUBINUR Negative 01/02/2012 1820   KETONESUR NEGATIVE 09/08/2024 1303   PROTEINUR NEGATIVE 09/08/2024 1303   UROBILINOGEN 0.2 05/30/2024 1444   NITRITE POSITIVE (A) 09/08/2024 1303   LEUKOCYTESUR MODERATE (A) 09/08/2024 1303   LEUKOCYTESUR Negative 01/02/2012 1820   Sepsis Labs: @LABRCNTIP (procalcitonin:4,lacticidven:4) Microbiology: No results found for this or any previous visit (from the past 240 hours).    Radiology Studies last 3 days: No results found.      Delonta Yohannes, DO [facility] 09/19/2024, 5:18 PM    Dictation software may have been used to  generate the above note. Typos may occur and escape review in typed/dictated notes. Please contact Dr Marsa directly for clarity if needed.  Staff may message me via secure chat in Epic  but this may not receive an immediate response,  please page me for urgent matters!  If 7PM-7AM, please contact night coverage [email or web address]

## 2024-09-19 NOTE — Progress Notes (Signed)
 Physical Therapy Treatment Patient Details Name: Angel Riddle MRN: [id number] DOB: [birth year] Today's Date: 09/19/2024   History of Present Illness Pt is a 68 y/o F presenting to ED with c/o worsening back and R leg pain. MD assessment includes right leg pain secondary to degenerative disc disease. PMH significant for chronic back pain, MDD, HTN, GAD, GERD.    PT Comments  Pt received in bed, discussed at length current pain and pain control. Pt wondering if she will need STR after receiving spinal injection on Friday. Daughter states if pain is well controlled on meds she will d/c with, she may be able to return home. This am, pain is still present yet controlled enough to tolerate gait training in hall with reliance on RW ~192ft  w/ CGA. No buckling noted. Discussed recent findings with pt and educated her on dermatomes with good understanding. (Per Chart Review, Dr. Mavis is hopeful to avoid surgery for now. He may consider extending her fusion to L5-S1/ilium but would like her to have more time to get off Eliquis  first) Will continue to progress per POC.    If plan is discharge home, recommend the following: A little help with walking and/or transfers;A little help with bathing/dressing/bathroom;Assist for transportation   Can travel by private vehicle     Yes  Equipment Recommendations  Other (comment) (STR vs Home with HHPT depending on pain control and function on home regimine)    Recommendations for Other Services       Precautions / Restrictions Precautions Precautions: Fall Recall of Precautions/Restrictions: Intact Restrictions Weight Bearing Restrictions Per Provider Order: No     Mobility  Bed Mobility Overal bed mobility: Needs Assistance Bed Mobility: Supine to Sit Rolling: Supervision, Used rails Sidelying to sit: Contact guard assist, Used rails     Sit to sidelying: Supervision General bed mobility comments:  (Assistance level varies as pain intensity  fluctuates.)    Transfers Overall transfer level: Needs assistance Equipment used: Rolling walker (2 wheels) Transfers: Sit to/from Stand Sit to Stand: Supervision, Contact guard assist                Ambulation/Gait Ambulation/Gait assistance: Contact guard assist Gait Distance (Feet): 160 Feet Assistive device: Rolling walker (2 wheels) Gait Pattern/deviations: Decreased stance time - right, Step-to pattern, Decreased step length - left, Decreased weight shift to right Gait velocity: decreased     General Gait Details:  (Pain well controled this date)   Optometrist     Tilt Bed    Modified Rankin (Stroke Patients Only)       Balance Overall balance assessment: Needs assistance Sitting-balance support: Feet supported Sitting balance-Leahy Scale: Good Sitting balance - Comments: steady static and dynamic sitting   Standing balance support: Bilateral upper extremity supported, During functional activity, Reliant on assistive device for balance Standing balance-Leahy Scale: Good Standing balance comment: no LOB with BUE support                            Communication Communication Communication: No apparent difficulties  Cognition Arousal: Alert Behavior During Therapy: WFL for tasks assessed/performed   PT - Cognitive impairments: No apparent impairments                       PT - Cognition Comments: A&Ox4, very talkative Following commands: Intact  Cueing Cueing Techniques: Verbal cues, Tactile cues, Visual cues  Exercises      General Comments        Pertinent Vitals/Pain Pain Assessment Pain Assessment: Faces Faces Pain Scale: Hurts little more Pain Location:  (R lateral calf to anterior ankle, L5 Dermatone) Pain Descriptors / Indicators: Discomfort, Sharp Pain Intervention(s): Monitored during session, Premedicated before session    Home Living                           Prior Function            PT Goals (current goals can now be found in the care plan section) Acute Rehab PT Goals Patient Stated Goal: to get her pain under control Progress towards PT goals: Progressing toward goals    Frequency    Min 2X/week      PT Plan      Co-evaluation              AM-PAC PT 6 Clicks Mobility   Outcome Measure  Help needed turning from your back to your side while in a flat bed without using bedrails?: A Little Help needed moving from lying on your back to sitting on the side of a flat bed without using bedrails?: A Little Help needed moving to and from a bed to a chair (including a wheelchair)?: A Little Help needed standing up from a chair using your arms (e.g., wheelchair or bedside chair)?: A Little Help needed to walk in hospital room?: A Little Help needed climbing 3-5 steps with a railing? : A Little 6 Click Score: 18    End of Session Equipment Utilized During Treatment: Gait belt Activity Tolerance: Patient tolerated treatment well Patient left: in bed;with call bell/phone within reach;with bed alarm set;with family/visitor present Nurse Communication: Mobility status PT Visit Diagnosis: Unsteadiness on feet (R26.81);Muscle weakness (generalized) (M62.81);Difficulty in walking, not elsewhere classified (R26.2);Pain Pain - Right/Left: Right Pain - part of body: Leg     Time: 8944-8875 PT Time Calculation (min) (ACUTE ONLY): 29 min  Charges:    $Gait Training: 8-22 mins $Therapeutic Activity: 8-22 mins PT General Charges $$ ACUTE PT VISIT: 1 Visit                    Angel Riddle, PTA  Angel Riddle 09/19/2024, 12:20 PM

## 2024-09-19 NOTE — Hospital Course (Addendum)
 Hospital course / significant events:   HPI: Angel Riddle is a 68 y.o. female with a PMH significant for chronic back pain secondary to disc herniation, MDD, hypertension, generalized anxiety disorder, GERD, PE in June 2025 on eliquis , presenting to the ED with worsening back pain and right leg pain. Last epidural injection about 3 weeks ago. This was second epidural.  Pt had back surgery in March 2025. First back surgery in 2018. Plan for further operative management in 12/2024. [name] is concerned about patient's mobility and her safety given she lives alone.  She does not report any injuries or falls. States has been getting worse over the last 2 months   10/09: to ED. Given she had a recent ED visit, [facility] was consulted for admission. No lab work or imaging done in the ED.  10/10: trial steroids, antiinflammatories, muscle relaxer, voltaren, lidocaine  topical, Lyrica . Qualifies for SNF rehab 10/11-10/12: stable thru weekend. Increased meds see order hx 10/13: significant increases to pain management, still poorly controlled. IR consulted to evaluate for possible injection - awaiting injection Friday 10/17 following Eliquis  washout 10/14: pain some improvement. Pending SNF rehab auth  10/15-10/16: pt ambulating occasionally but still having significant pain which prohibits walking / working w/ PT.  10/17: injection today 10/18-10/19: pain improved, she is not needing po prn rx nearly as much. Planning for SNF rehab  10/20: approved to go to rehab tomorrow. Sent message to PCP re: consideration for continuation fentanyl  patches outpatient      Consultants:  Palliative care - no formal consult Interventional Radiology    Procedures/Surgeries: 09/21/24  Technically successful injection consisting of a right L5 and right S1 nerve root blocks and transforaminal epidurals      ASSESSMENT & PLAN:   Back pain with radiculopathy and right leg pain secondary to degenerative disc  disease with herniation MRI performed on 08/30 showed L5-S1 disc protrusion with impingement of L5 and descending S1 nerve roots, resultant severe right lateral recess stenosis and right worse than left L5 foraminal narrowing.   Has been tentatively scheduled outpatient for further surgical intervention which is delayed due to being on DOAC for PE S/p IR injection L5 S1 fentanyl  patch 25mcg  Hydrocodone -APAP 10-325 mg 2 tabs q4h prn severe pain - was needing 2 tabs 4-5 times per day prior to injection, now at about 1-2 tabs once per day. Encouraged to minimize use and consider pre-dose prior to activity she knows will exacerbate back/leg pain  Lyrica  200mg  BID Robaxin  500 mg TID topical voltaren  Topical lidocaine  patches bowel regimen PT/OT- recommending SNF. Patient is agreeable to this and would like to pursue placement. Steroid tapered OFF at this time   Chronic conditions: HTN: continue home meds (maxide 37.5-25 mg half tablet daily) HLD; continue home meds (zetia  every other day and Praluent  q2weeks) GERD: continue home meds (PPI) MDD: Continue home fluoxetine .  PE: eliquis  held. Last dose morning of 10/13 Anxiety: ativan  prn Insomnia: trazodone 100 mg at bedtime     overweight based on BMI: Body mass index is 28.98 kg/m.[name] Significantly low or high BMI is associated with higher medical risk.  Underweight - under 18  overweight - 25 to 29 obese - 30 or more Class 1 obesity: BMI of 30.0 to 34 Class 2 obesity: BMI of 35.0 to 39 Class 3 obesity: BMI of 40.0 to 49 Super Morbid Obesity: BMI 50-59 Super-super Morbid Obesity: BMI 60+ Healthy nutrition and physical activity advised as adjunct to other disease management  and risk reduction treatments    DVT prophylaxis: eliquis  --> HELD for upcoming injection IV fluids: no continuous IV fluids  Nutrition: regular Central lines / other devices: none  Code Status: DNR ACP documentation reviewed:  none on file in [facility]  [facility]  needs: SNF rehab  Medical barriers to dispo: none

## 2024-09-20 DIAGNOSIS — M79604 Pain in right leg: Secondary | ICD-10-CM | POA: Diagnosis not present

## 2024-09-20 MED ORDER — ALPRAZOLAM 0.5 MG PO TABS
0.5000 mg | ORAL_TABLET | Freq: Once | ORAL | Status: AC | PRN
  Administered 2024-09-21: 0.5 mg via ORAL
  Filled 2024-09-20: qty 1

## 2024-09-20 MED ORDER — DIPHENHYDRAMINE HCL 25 MG PO CAPS
50.0000 mg | ORAL_CAPSULE | Freq: Once | ORAL | Status: AC | PRN
  Administered 2024-09-21: 50 mg via ORAL
  Filled 2024-09-20: qty 2

## 2024-09-20 MED ORDER — ALIROCUMAB 75 MG/ML ~~LOC~~ SOAJ
75.0000 mg | SUBCUTANEOUS | Status: DC
  Administered 2024-09-21: 75 mg via SUBCUTANEOUS
  Filled 2024-09-20: qty 1

## 2024-09-20 NOTE — TOC Progression Note (Signed)
 Transition of Care [facility]) - Progression Note    Patient Details  Name: Angel Riddle MRN: [id number] Date of Birth: Jul 04, 1956  [facility] CM/SW Contact  Marinda Cooks, RN Phone Number: 09/20/2024, 9:19 AM  Clinical Narrative:    This CM confirmed with MD pt is not medically cleared to dc today.MD informed R injection L5 S1 tentative scheduled for Friday 09/21/24. [facility]. TOC will cont to follow dc plan / care coordination and update as applicable.                      Expected Discharge Plan and Services                                               Social Drivers of Health (SDOH) Interventions SDOH Screenings   Food Insecurity: No Food Insecurity (09/13/2024)  Housing: Low Risk  (09/13/2024)  Transportation Needs: No Transportation Needs (09/13/2024)  Utilities: Not At Risk (09/13/2024)  Alcohol Screen: Low Risk  (06/23/2023)  Depression (PHQ2-9): High Risk (09/07/2024)  Financial Resource Strain: Low Risk  (09/02/2024)   Received from [facility]  Physical Activity: Unknown (06/23/2023)  Social Connections: Moderately Integrated (09/13/2024)  Stress: No Stress Concern Present (06/23/2023)  Tobacco Use: High Risk (09/13/2024)    Readmission Risk Interventions     No data to display

## 2024-09-20 NOTE — Progress Notes (Signed)
 Brief Interventional Radiology Note:  Angel Riddle 68 year old female with a history of chronic back pain and degenerative disc disease who presented to the ED on 10/9 with complaints of worsening right leg pain. Patient has been medically managed with multiple opioids, but continues to have minimal pain relief. She has had several previous transforaminal epidural injections at various levels including bilaterally at L2-3, right L2-3, right L5-S1, and right S1. Her most recent injection was a right L5-S1 and right S1 transforaminal epidural injection at [facility] on 9/17. She reported a few weeks of relief with her last injection, but then states the pain soon returned.   On evaluation, she continues to have right sided back pain of the lower lumbar/sacral region which radiates down her right leg all the way to her toes. She denies any numbness/tingling of her groin or bowel/bladder incontinence. States that the pain is tolerable with the medications she is receiving, but she is still unable to even roll in bed without severe discomfort.   Discussed possible nerve root block with epidural steroid injection of her right L5-S1 and right S1 levels similar to what she has had previously. Patient is extremely grateful for the opportunity for another injection. She is aware that she may not be considered for another ESI given that she has had several in the last few months. Discussed with her the importance of following up with her surgeon, Dr. JINNY Budge, to determine a more permanent solution. She verbalized understanding and is agreeable to this plan.   IR will plan for a selective right L5-S1 and right selective S1 nerve root block with transforaminal epidural steroid injection on 10/17 with Dr. VEAR Lent. Consent obtained and in IR.  Please reach out to IR with any additional questions or concerns.   Electronically Signed: Celedonio Sortino M Jonte Shiller, PA-C 09/20/2024, 3:10 PM

## 2024-09-20 NOTE — Progress Notes (Signed)
 Physical Therapy Treatment Patient Details Name: Angel Riddle MRN: [id number] DOB: 07-25-1956 Today's Date: 09/20/2024   History of Present Illness Pt is a 68 y/o F presenting to ED with c/o worsening back and R leg pain. MD assessment includes right leg pain secondary to degenerative disc disease. PMH significant for chronic back pain, MDD, HTN, GAD, GERD.    PT Comments  Pt alert, pleasant and agreeable to participate in PT tx. Pt met semi-supine in bed, modI for bed mobility this date with use of bed features to assist. Pt completed gentle BLE exercises while sitting EOB with good tolerance. Pt performed 2 STS from EOB with CGA, mod VC for proper hand placement on RW. Pt amb ~54ft, slow cadence throughout with decreased stance time and stride length on RLE, no LOB. Pt returned to semi-supine in bed, cited fatigue from activity today. Pt was left with all needs in reach, supportive friend at bedside. The patient would benefit from further skilled PT intervention to continue to progress towards goals.      If plan is discharge home, recommend the following: A little help with walking and/or transfers;A little help with bathing/dressing/bathroom;Assist for transportation   Can travel by private vehicle     Yes  Equipment Recommendations  Other (comment) (TBD at next venue of care)    Recommendations for Other Services       Precautions / Restrictions Precautions Precautions: Fall Recall of Precautions/Restrictions: Intact Restrictions Weight Bearing Restrictions Per Provider Order: No     Mobility  Bed Mobility Overal bed mobility: Modified Independent             General bed mobility comments: no physical assistance required, use of bed features to assist    Transfers Overall transfer level: Needs assistance Equipment used: Rolling walker (2 wheels) Transfers: Sit to/from Stand Sit to Stand: Contact guard assist           General transfer comment: 2 STS from  EOB with CGA, mod VC for hand placement on RW    Ambulation/Gait Ambulation/Gait assistance: Contact guard assist Gait Distance (Feet): 75 Feet Assistive device: Rolling walker (2 wheels) Gait Pattern/deviations: Decreased stance time - right, Step-to pattern, Decreased step length - left, Decreased weight shift to right Gait velocity: decreased     General Gait Details: Slow cadence throughout, poor WB tolerance on RLE with decreased stance time/stride length. No LOB, min VC for RW positioning   Stairs             Wheelchair Mobility     Tilt Bed    Modified Rankin (Stroke Patients Only)       Balance Overall balance assessment: Needs assistance Sitting-balance support: Feet supported Sitting balance-Leahy Scale: Normal Sitting balance - Comments: steady static and dynamic sitting   Standing balance support: Bilateral upper extremity supported, No upper extremity supported Standing balance-Leahy Scale: Good Standing balance comment: able to stand at sink and brush hair with steady balance, mild increased postural sway                            Communication Communication Communication: No apparent difficulties  Cognition Arousal: Alert Behavior During Therapy: WFL for tasks assessed/performed   PT - Cognitive impairments: No apparent impairments                       PT - Cognition Comments: A&Ox4 Following commands: Intact  Cueing Cueing Techniques: Verbal cues, Tactile cues, Visual cues  Exercises General Exercises - Lower Extremity Ankle Circles/Pumps: AROM, Both, 10 reps, Seated Long Arc Quad: AROM, Both, 10 reps, Seated Hip Flexion/Marching: AROM, Both, 10 reps, Seated Heel Raises: AROM, Both, 10 reps, Seated    General Comments        Pertinent Vitals/Pain Pain Assessment Pain Assessment: Faces Faces Pain Scale: Hurts little more Pain Location: R lower leg, with WB Pain Descriptors / Indicators: Discomfort,  Sharp Pain Intervention(s): Limited activity within patient's tolerance, Monitored during session, Repositioned    Home Living                          Prior Function            PT Goals (current goals can now be found in the care plan section) Progress towards PT goals: Progressing toward goals    Frequency    Min 2X/week      PT Plan      Co-evaluation              AM-PAC PT 6 Clicks Mobility   Outcome Measure  Help needed turning from your back to your side while in a flat bed without using bedrails?: A Little Help needed moving from lying on your back to sitting on the side of a flat bed without using bedrails?: A Little Help needed moving to and from a bed to a chair (including a wheelchair)?: A Little Help needed standing up from a chair using your arms (e.g., wheelchair or bedside chair)?: A Little Help needed to walk in hospital room?: A Little Help needed climbing 3-5 steps with a railing? : A Lot 6 Click Score: 17    End of Session Equipment Utilized During Treatment: Gait belt Activity Tolerance: Patient tolerated treatment well Patient left: in bed;with call bell/phone within reach;with family/visitor present Nurse Communication: Mobility status PT Visit Diagnosis: Unsteadiness on feet (R26.81);Muscle weakness (generalized) (M62.81);Difficulty in walking, not elsewhere classified (R26.2);Pain Pain - Right/Left: Right Pain - part of body: Leg     Time: 8469-8454 PT Time Calculation (min) (ACUTE ONLY): 15 min  Charges:    $Therapeutic Activity: 8-22 mins PT General Charges $$ ACUTE PT VISIT: 1 Visit                     Janell Axe, SPT

## 2024-09-20 NOTE — Plan of Care (Signed)
   Problem: Education: Goal: Knowledge of General Education information will improve Description Including pain rating scale, medication(s)/side effects and non-pharmacologic comfort measures Outcome: Progressing

## 2024-09-20 NOTE — Progress Notes (Signed)
 PROGRESS NOTE    Angel Riddle   FMW:[id number] DOB: 1956-11-02  DOA: 09/13/2024 Date of Service: 09/20/24 which is hospital day 7  PCP: Angel Shad, MD    Hospital course / significant events:   HPI: Angel Riddle is a 68 y.o. female with a PMH significant for chronic back pain secondary to disc herniation, MDD, hypertension, generalized anxiety disorder, GERD, PE in June 2025 on eliquis , presenting to the ED with worsening back pain and right leg pain. Last epidural injection about 3 weeks ago. This was second epidural.  Pt had back surgery in March 2025. First back surgery in 2018. Plan for further operative management in 12/2024. [name] is concerned about patient's mobility and her safety given she lives alone.  She does not report any injuries or falls. States has been getting worse over the last 2 months   10/09: to ED. Given she had a recent ED visit, [facility] was consulted for admission. No lab work or imaging done in the ED.  10/10: trial steroids, antiinflammatories, muscle relaxer, voltaren, lidocaine  topical, Lyrica . Qualifies for SNF rehab 10/11-10/12: stable thru weekend. Increased meds see order hx 10/13: significant increases to pain management, still poorly controlled. IR consulted to evaluate for possible injection  10/14: pain some improvement. Pending SNF rehab auth  10/15: pt ambulated well but still having significant pain which occasionally prohibits walking / working w/ PT, awaiting injection Friday 10/17     Consultants:  Palliative care - no formal consult   Procedures/Surgeries: None       ASSESSMENT & PLAN:   Back pain with radiculopathy and right leg pain secondary to degenerative disc disease with herniation MRI performed on 08/30 showed L5-S1 disc protrusion with impingement of L5 and descending S1 nerve roots, resultant severe right lateral recess stenosis and right worse than left L5 foraminal narrowing.   Has been tentatively  scheduled outpatient for further surgical intervention which is delayed due to being on DOAC for PE Hydrocodone -APAP 10-325 mg 2 tabs q4h prn severe pain  IV Dilaudid  added for breakthrough pain. fentanyl  patch 25mcg  lidocaine  patches Lyrica  200mg  BID Robaxin  500 mg TID Steroid tapering topical voltaren  Topical lidocaine  patches  bowel regimen PT/OT- recommending SNF. Patient is agreeable to this IR injection L5 S1 tentative scheduled for Friday. Eliquis  will be held 48hr prior to this procedure.  Of note, pt reports reaction to contrast many years ago, she states this is the reason she takes Benadryl 50 mg and Xanax  0.5 mg prior to her previous injection and requests this again - orders placed.   Chronic conditions: HTN: continue home meds (maxide 37.5-25 mg half tablet daily) HLD; continue home meds (zetia  every other day and Praluent  q2weeks) GERD: continue home meds (PPI) MDD: Continue home fluoxetine .  PE: eliquis  held. Last dose morning of 10/13 Anxiety: ativan  prn Insomnia: trazodone 100 mg at bedtime     overweight based on BMI: Body mass index is 28.98 kg/m.[name] Significantly low or high BMI is associated with higher medical risk.  Underweight - under 18  overweight - 25 to 29 obese - 30 or more Class 1 obesity: BMI of 30.0 to 34 Class 2 obesity: BMI of 35.0 to 39 Class 3 obesity: BMI of 40.0 to 49 Super Morbid Obesity: BMI 50-59 Super-super Morbid Obesity: BMI 60+ Healthy nutrition and physical activity advised as adjunct to other disease management and risk reduction treatments    DVT prophylaxis: eliquis  --> HELD for upcoming injection IV fluids:  no continuous IV fluids  Nutrition: regular Central lines / other devices: none  Code Status: DNR ACP documentation reviewed:  none on file in [facility]  [facility] needs: SNF rehab Medical barriers to dispo: injection on Friday. Expected medical readiness for discharge once injection done / pending clinical course.               Subjective / Brief ROS:  Patient reports more sore today, unable to work w/ PT earlier but will call them when pais is better  meds kicked in Denies CP/SOB.  Pain controlled at this time  Denies new weakness.  Tolerating diet.    Family Communication: spoke w/ daughter yesterday, niece is at bedside today     Objective Findings:  Vitals:   09/19/24 1424 09/19/24 1954 09/20/24 0452 09/20/24 0756  BP: 130/68 137/83 119/80 128/87  Pulse: (!) 105 91 80 87  Resp: 16 18 18 17   Temp: 98.5 F (36.9 C) 98.5 F (36.9 C) 98 F (36.7 C) 97.8 F (36.6 C)  TempSrc: Oral Oral Oral   SpO2: 100% 94% 96% 98%  Weight:      Height:        Intake/Output Summary (Last 24 hours) at 09/20/2024 1541 Last data filed at 09/20/2024 0900 Gross per 24 hour  Intake 640 ml  Output --  Net 640 ml   Filed Weights   09/13/24 1024  Weight: 79 kg    Examination:  Physical Exam Constitutional:      General: She is not in acute distress. Cardiovascular:     Rate and Rhythm: Normal rate and regular rhythm.  Musculoskeletal:     Right lower leg: No edema.     Left lower leg: No edema.  Neurological:     Mental Status: She is alert.          Scheduled Medications:   ezetimibe   10 mg Oral Q M,W,F   fentaNYL   1 patch Transdermal Q72H   FLUoxetine   40 mg Oral Daily   lidocaine   2 patch Transdermal Q24H   methocarbamol   500 mg Oral TID   nicotine   14 mg Transdermal Daily   pantoprazole   40 mg Oral QHS   polyethylene glycol  17 g Oral BID   potassium chloride   10 mEq Oral Daily   pregabalin   200 mg Oral BID   traZODone  100 mg Oral QHS    Continuous Infusions:   PRN Medications:  [START ON 09/21/2024] ALPRAZolam , [START ON 09/21/2024] diphenhydrAMINE, HYDROcodone -acetaminophen , HYDROmorphone  (DILAUDID ) injection, LORazepam , melatonin, mouth rinse  Antimicrobials from admission:  Anti-infectives (From admission, onward)    None           Data  Reviewed:  I have personally reviewed the following...  CBC: Recent Labs  Lab 09/14/24 0328  WBC 9.7  HGB 13.2  HCT 38.1  MCV 84.9  PLT 304   Basic Metabolic Panel: Recent Labs  Lab 09/14/24 0328  NA 138  K 3.9  CL 102  CO2 25  GLUCOSE 121*  BUN 17  CREATININE 0.79  CALCIUM 9.3   GFR: Estimated Creatinine Clearance: 69.9 mL/min (by C-G formula based on SCr of 0.79 mg/dL). Liver Function Tests: No results for input(s): AST, ALT, ALKPHOS, BILITOT, PROT, ALBUMIN  in the last 168 hours. No results for input(s): LIPASE, AMYLASE in the last 168 hours. No results for input(s): AMMONIA in the last 168 hours. Coagulation Profile: No results for input(s): INR, PROTIME in the last 168 hours.  Cardiac Enzymes: No  results for input(s): CKTOTAL, CKMB, CKMBINDEX, TROPONINI in the last 168 hours. BNP (last 3 results) No results for input(s): PROBNP in the last 8760 hours. HbA1C: No results for input(s): HGBA1C in the last 72 hours. CBG: No results for input(s): GLUCAP in the last 168 hours. Lipid Profile: No results for input(s): CHOL, HDL, LDLCALC, TRIG, CHOLHDL, LDLDIRECT in the last 72 hours. Thyroid  Function Tests: No results for input(s): TSH, T4TOTAL, FREET4, T3FREE, THYROIDAB in the last 72 hours. Anemia Panel: No results for input(s): VITAMINB12, FOLATE, FERRITIN, TIBC, IRON, RETICCTPCT in the last 72 hours. Most Recent Urinalysis On File:     Component Value Date/Time   COLORURINE YELLOW (A) 09/08/2024 1303   APPEARANCEUR CLOUDY (A) 09/08/2024 1303   APPEARANCEUR Clear 09/07/2024 1442   LABSPEC 1.013 09/08/2024 1303   LABSPEC 1.008 01/02/2012 1820   PHURINE 7.0 09/08/2024 1303   GLUCOSEU NEGATIVE 09/08/2024 1303   GLUCOSEU NEGATIVE 05/30/2024 1444   HGBUR NEGATIVE 09/08/2024 1303   BILIRUBINUR NEGATIVE 09/08/2024 1303   BILIRUBINUR Negative 09/07/2024 1442   BILIRUBINUR Negative 01/02/2012  1820   KETONESUR NEGATIVE 09/08/2024 1303   PROTEINUR NEGATIVE 09/08/2024 1303   UROBILINOGEN 0.2 05/30/2024 1444   NITRITE POSITIVE (A) 09/08/2024 1303   LEUKOCYTESUR MODERATE (A) 09/08/2024 1303   LEUKOCYTESUR Negative 01/02/2012 1820   Sepsis Labs: @LABRCNTIP (procalcitonin:4,lacticidven:4) Microbiology: No results found for this or any previous visit (from the past 240 hours).    Radiology Studies last 3 days: No results found.      Jahni Nazar, DO [facility] 09/20/2024, 3:41 PM    Dictation software may have been used to generate the above note. Typos may occur and escape review in typed/dictated notes. Please contact Dr Marsa directly for clarity if needed.  Staff may message me via secure chat in Epic  but this may not receive an immediate response,  please page me for urgent matters!  If 7PM-7AM, please contact night coverage [email or web address]

## 2024-09-20 NOTE — Progress Notes (Signed)
 Occupational Therapy Treatment Patient Details Name: Angel Riddle MRN: [id number] DOB: 10/04/1956 Today's Date: 09/20/2024   History of present illness Pt is a 68 y/o F presenting to ED with c/o worsening back and R leg pain. MD assessment includes right leg pain secondary to degenerative disc disease. PMH significant for chronic back pain, MDD, HTN, GAD, GERD.   OT comments  Angel Riddle was seen for OT treatment on this date. Upon arrival to room pt in bed, agreeable to tx. Pt premedicated for session however pain continues to limit activity tolerance. Pt requires SUPERVISION for ADL t/f no AD use ~5 ft and functional reaching task placing objects in cabinet at ankle height and above head height shelves. IND don/doff B socks in sitting using figure 4 position. Educated on stretches, DME recs, d/c recs, falls safety, and home modifications if pt continues to progress towards home. Currently limited by pain and living alone. Pt making good progress toward goals, will continue to follow POC. Discharge recommendation updated to reflect progress.       If plan is discharge home, recommend the following:  Help with stairs or ramp for entrance;Assistance with cooking/housework   Equipment Recommendations  BSC/3in1    Recommendations for Other Services      Precautions / Restrictions Precautions Precautions: Fall Recall of Precautions/Restrictions: Intact Restrictions Weight Bearing Restrictions Per Provider Order: No       Mobility Bed Mobility Overal bed mobility: Modified Independent             General bed mobility comments: rails and HOB    Transfers Overall transfer level: Needs assistance Equipment used: None Transfers: Sit to/from Stand Sit to Stand: Supervision                 Balance Overall balance assessment: Needs assistance Sitting-balance support: Feet supported Sitting balance-Leahy Scale: Normal     Standing balance support: No upper extremity  supported, During functional activity Standing balance-Leahy Scale: Good                             ADL either performed or assessed with clinical judgement   ADL Overall ADL's : Needs assistance/impaired                                       General ADL Comments: SUPERVISION for ADL t/f no AD use and functional reaching task placing objects in cabinet at ankle height and above head height shelves. IND don/doff B socks in sitting using figure 4 position     Communication Communication Communication: No apparent difficulties   Cognition Arousal: Alert Behavior During Therapy: WFL for tasks assessed/performed Cognition: No apparent impairments                               Following commands: Intact                      Pertinent Vitals/ Pain       Pain Assessment Pain Assessment: Faces Faces Pain Scale: Hurts little more Pain Location: R calf, increased with activity Pain Descriptors / Indicators: Discomfort, Sharp Pain Intervention(s): Limited activity within patient's tolerance, Repositioned   Frequency  Min 2X/week        Progress Toward Goals  OT Goals(current goals can now be  found in the care plan section)  Progress towards OT goals: Progressing toward goals  Acute Rehab OT Goals OT Goal Formulation: With patient Time For Goal Achievement: 09/28/24 Potential to Achieve Goals: Good ADL Goals Pt Will Perform Grooming: with modified independence;standing Pt Will Transfer to Toilet: with modified independence;bedside commode Pt Will Perform Toileting - Clothing Manipulation and hygiene: with modified independence;sit to/from stand  Plan      Co-evaluation                 AM-PAC OT 6 Clicks Daily Activity     Outcome Measure   Help from another person eating meals?: None Help from another person taking care of personal grooming?: None Help from another person toileting, which includes using  toliet, bedpan, or urinal?: A Little Help from another person bathing (including washing, rinsing, drying)?: A Little Help from another person to put on and taking off regular upper body clothing?: None Help from another person to put on and taking off regular lower body clothing?: A Little 6 Click Score: 21    End of Session    OT Visit Diagnosis: Unsteadiness on feet (R26.81)   Activity Tolerance Patient tolerated treatment well   Patient Left in bed;with call bell/phone within reach;with family/visitor present   Nurse Communication          Time: 8976-8955 OT Time Calculation (min): 21 min  Charges: OT General Charges $OT Visit: 1 Visit OT Treatments $Self Care/Home Management : 8-22 mins  Angel Riddle, M.S. OTR/L  09/20/24, 10:58 AM  ascom [phone]

## 2024-09-21 ENCOUNTER — Inpatient Hospital Stay: Admitting: Radiology

## 2024-09-21 ENCOUNTER — Other Ambulatory Visit: Payer: Self-pay

## 2024-09-21 DIAGNOSIS — M79604 Pain in right leg: Secondary | ICD-10-CM | POA: Diagnosis not present

## 2024-09-21 HISTORY — PX: IR TRANSFOR EPI L/S SNG W/FL/CT: IMG2351

## 2024-09-21 MED ORDER — METHYLPREDNISOLONE ACETATE 80 MG/ML IJ SUSP
80.0000 mg | Freq: Once | INTRAMUSCULAR | Status: AC
  Administered 2024-09-21: 60 mg via INTRA_ARTICULAR

## 2024-09-21 MED ORDER — IOHEXOL 180 MG/ML  SOLN
20.0000 mL | Freq: Once | INTRAMUSCULAR | Status: AC | PRN
  Administered 2024-09-21: 1.5 mL via INTRATHECAL

## 2024-09-21 MED ORDER — BUPIVACAINE HCL 0.5 % IJ SOLN
10.0000 mL | Freq: Once | INTRAMUSCULAR | Status: DC
Start: 2024-09-21 — End: 2024-09-25
  Filled 2024-09-21: qty 10

## 2024-09-21 MED ORDER — LIDOCAINE 1 % OPTIME INJ - NO CHARGE
5.0000 mL | Freq: Once | INTRAMUSCULAR | Status: DC
  Filled 2024-09-21: qty 6

## 2024-09-21 MED ORDER — METHYLPREDNISOLONE ACETATE 80 MG/ML IJ SUSP
80.0000 mg | Freq: Once | INTRAMUSCULAR | Status: AC
  Administered 2024-09-21: 60 mg via INTRA_ARTICULAR
  Filled 2024-09-21: qty 1

## 2024-09-21 MED ORDER — LIDOCAINE 1 % OPTIME INJ - NO CHARGE
10.0000 mL | Freq: Once | INTRAMUSCULAR | Status: AC
  Administered 2024-09-21: 10 mL

## 2024-09-21 MED ORDER — METHYLPREDNISOLONE ACETATE 80 MG/ML IJ SUSP
INTRAMUSCULAR | Status: AC
  Filled 2024-09-21: qty 2

## 2024-09-21 MED ORDER — BUPIVACAINE HCL (PF) 0.5 % IJ SOLN
INTRAMUSCULAR | Status: AC
  Filled 2024-09-21: qty 30

## 2024-09-21 NOTE — Progress Notes (Signed)
 PROGRESS NOTE    Angel Riddle   FMW:[id number] DOB: December 30, 1955  DOA: 09/13/2024 Date of Service: 09/21/24 which is hospital day 8  PCP: [name], MD    Hospital course / significant events:   HPI: Angel Riddle is a 68 y.o. female with a PMH significant for chronic back pain secondary to disc herniation, MDD, hypertension, generalized anxiety disorder, GERD, PE in June 2025 on eliquis , presenting to the ED with worsening back pain and right leg pain. Last epidural injection about 3 weeks ago. This was second epidural.  Pt had back surgery in March 2025. First back surgery in 2018. Plan for further operative management in 12/2024. [name] is concerned about patient's mobility and her safety given she lives alone.  She does not report any injuries or falls. States has been getting worse over the last 2 months   10/09: to ED. Given she had a recent ED visit, [facility] was consulted for admission. No lab work or imaging done in the ED.  10/10: trial steroids, antiinflammatories, muscle relaxer, voltaren, lidocaine  topical, Lyrica . Qualifies for SNF rehab 10/11-10/12: stable thru weekend. Increased meds see order hx 10/13: significant increases to pain management, still poorly controlled. IR consulted to evaluate for possible injection  10/14: pain some improvement. Pending SNF rehab auth  10/15-10/16: pt ambulating occasionally but still having significant pain which prohibits walking / working w/ PT. awaiting injection Friday 10/17 10/17: injection today     Consultants:  Palliative care - no formal consult Interventional Radiology    Procedures/Surgeries: None       ASSESSMENT & PLAN:   Back pain with radiculopathy and right leg pain secondary to degenerative disc disease with herniation MRI performed on 08/30 showed L5-S1 disc protrusion with impingement of L5 and descending S1 nerve roots, resultant severe right lateral recess stenosis and right worse than  left L5 foraminal narrowing.   Has been tentatively scheduled outpatient for further surgical intervention which is delayed due to being on DOAC for PE Hydrocodone -APAP 10-325 mg 2 tabs q4h prn severe pain  IV Dilaudid  added for breakthrough pain. fentanyl  patch 25mcg  lidocaine  patches Lyrica  200mg  BID Robaxin  500 mg TID Steroid tapering topical voltaren  Topical lidocaine  patches  bowel regimen PT/OT- recommending SNF. Patient is agreeable to this IR injection L5 S1 tentative scheduled for today. Eliquis  has been held 48hr prior to this procedure.  Of note, pt reports reaction to contrast many years ago, she states this is the reason she takes Benadryl 50 mg and Xanax  0.5 mg prior to her previous injection and requests this again - orders placed.   Chronic conditions: HTN: continue home meds (maxide 37.5-25 mg half tablet daily) HLD; continue home meds (zetia  every other day and Praluent  q2weeks) GERD: continue home meds (PPI) MDD: Continue home fluoxetine .  PE: eliquis  held. Last dose morning of 10/13 Anxiety: ativan  prn Insomnia: trazodone 100 mg at bedtime     overweight based on BMI: Body mass index is 28.98 kg/m.[name] Significantly low or high BMI is associated with higher medical risk.  Underweight - under 18  overweight - 25 to 29 obese - 30 or more Class 1 obesity: BMI of 30.0 to 34 Class 2 obesity: BMI of 35.0 to 39 Class 3 obesity: BMI of 40.0 to 49 Super Morbid Obesity: BMI 50-59 Super-super Morbid Obesity: BMI 60+ Healthy nutrition and physical activity advised as adjunct to other disease management and risk reduction treatments    DVT prophylaxis: eliquis  --> HELD  for upcoming injection IV fluids: no continuous IV fluids  Nutrition: regular Central lines / other devices: none  Code Status: DNR ACP documentation reviewed:  none on file in [facility]  TOC needs: SNF rehab vs HH/DME Medical barriers to dispo: injection today. Expected medical readiness for  discharge once injection done / pending clinical course.              Subjective / Brief ROS:  Patient reports more sore today, hopeful the injection will help  Denies CP/SOB.  Pain controlled at this time  Denies new weakness.  Tolerating diet.    Family Communication: family is at bedside today     Objective Findings:  Vitals:   09/20/24 1824 09/20/24 2005 09/21/24 0435 09/21/24 0743  BP: 122/81 133/77 119/71 117/69  Pulse: 86 97 92 82  Resp: 18 18 16 15   Temp:  98.5 F (36.9 C) 98.3 F (36.8 C) 97.6 F (36.4 C)  TempSrc:   Oral   SpO2: 99% 99% 98% 96%  Weight:      Height:       No intake or output data in the 24 hours ending 09/21/24 1411  Filed Weights   09/13/24 1024  Weight: 79 kg    Examination:  Physical Exam Constitutional:      General: She is not in acute distress. Cardiovascular:     Rate and Rhythm: Normal rate and regular rhythm.  Musculoskeletal:     Right lower leg: No edema.     Left lower leg: No edema.  Neurological:     Mental Status: She is alert.          Scheduled Medications:   Alirocumab   75 mg Subcutaneous Q14 Days   ezetimibe   10 mg Oral Q M,W,F   fentaNYL   1 patch Transdermal Q72H   FLUoxetine   40 mg Oral Daily   lidocaine   2 patch Transdermal Q24H   methocarbamol   500 mg Oral TID   nicotine   14 mg Transdermal Daily   pantoprazole   40 mg Oral QHS   polyethylene glycol  17 g Oral BID   potassium chloride   10 mEq Oral Daily   pregabalin   200 mg Oral BID   traZODone  100 mg Oral QHS    Continuous Infusions:   PRN Medications:  HYDROcodone -acetaminophen , LORazepam , melatonin, mouth rinse  Antimicrobials from admission:  Anti-infectives (From admission, onward)    None           Data Reviewed:  I have personally reviewed the following...  CBC: No results for input(s): WBC, NEUTROABS, HGB, HCT, MCV, PLT in the last 168 hours.  Basic Metabolic Panel: No results for input(s):  NA, K, CL, CO2, GLUCOSE, BUN, CREATININE, CALCIUM, MG, PHOS in the last 168 hours.  GFR: Estimated Creatinine Clearance: 69.9 mL/min (by C-G formula based on SCr of 0.79 mg/dL). Liver Function Tests: No results for input(s): AST, ALT, ALKPHOS, BILITOT, PROT, ALBUMIN  in the last 168 hours. No results for input(s): LIPASE, AMYLASE in the last 168 hours. No results for input(s): AMMONIA in the last 168 hours. Coagulation Profile: No results for input(s): INR, PROTIME in the last 168 hours.  Cardiac Enzymes: No results for input(s): CKTOTAL, CKMB, CKMBINDEX, TROPONINI in the last 168 hours. BNP (last 3 results) No results for input(s): PROBNP in the last 8760 hours. HbA1C: No results for input(s): HGBA1C in the last 72 hours. CBG: No results for input(s): GLUCAP in the last 168 hours. Lipid Profile: No results for input(s):  CHOL, HDL, LDLCALC, TRIG, CHOLHDL, LDLDIRECT in the last 72 hours. Thyroid  Function Tests: No results for input(s): TSH, T4TOTAL, FREET4, T3FREE, THYROIDAB in the last 72 hours. Anemia Panel: No results for input(s): VITAMINB12, FOLATE, FERRITIN, TIBC, IRON, RETICCTPCT in the last 72 hours. Most Recent Urinalysis On File:     Component Value Date/Time   COLORURINE YELLOW (A) 09/08/2024 1303   APPEARANCEUR CLOUDY (A) 09/08/2024 1303   APPEARANCEUR Clear 09/07/2024 1442   LABSPEC 1.013 09/08/2024 1303   LABSPEC 1.008 01/02/2012 1820   PHURINE 7.0 09/08/2024 1303   GLUCOSEU NEGATIVE 09/08/2024 1303   GLUCOSEU NEGATIVE 05/30/2024 1444   HGBUR NEGATIVE 09/08/2024 1303   BILIRUBINUR NEGATIVE 09/08/2024 1303   BILIRUBINUR Negative 09/07/2024 1442   BILIRUBINUR Negative 01/02/2012 1820   KETONESUR NEGATIVE 09/08/2024 1303   PROTEINUR NEGATIVE 09/08/2024 1303   UROBILINOGEN 0.2 05/30/2024 1444   NITRITE POSITIVE (A) 09/08/2024 1303   LEUKOCYTESUR MODERATE (A) 09/08/2024 1303    LEUKOCYTESUR Negative 01/02/2012 1820   Sepsis Labs: @LABRCNTIP (procalcitonin:4,lacticidven:4) Microbiology: No results found for this or any previous visit (from the past 240 hours).    Radiology Studies last 3 days: No results found.      Rosie Torrez, DO [facility] 09/21/2024, 2:11 PM    Dictation software may have been used to generate the above note. Typos may occur and escape review in typed/dictated notes. Please contact Dr Marsa directly for clarity if needed.  Staff may message me via secure chat in Epic  but this may not receive an immediate response,  please page me for urgent matters!  If 7PM-7AM, please contact night coverage [email or web address]

## 2024-09-21 NOTE — Plan of Care (Signed)

## 2024-09-21 NOTE — Plan of Care (Signed)

## 2024-09-21 NOTE — TOC Progression Note (Signed)
 Transition of Care [facility]) - Progression Note    Patient Details  Name: Angel Riddle MRN: [id number] Date of Birth: 08/17/1956  [facility] CM/SW Contact  Lauraine JAYSON Carpen, LCSW Phone Number: 09/21/2024, 11:46 AM  Clinical Narrative:   CSW started SNF insurance authorization.  Expected Discharge Plan and Services                                               Social Drivers of Health (SDOH) Interventions SDOH Screenings   Food Insecurity: No Food Insecurity (09/13/2024)  Housing: Low Risk  (09/13/2024)  Transportation Needs: No Transportation Needs (09/13/2024)  Utilities: Not At Risk (09/13/2024)  Alcohol Screen: Low Risk  (06/23/2023)  Depression (PHQ2-9): High Risk (09/07/2024)  Financial Resource Strain: Low Risk  (09/02/2024)   Received from [facility]  Physical Activity: Unknown (06/23/2023)  Social Connections: Moderately Integrated (09/13/2024)  Stress: No Stress Concern Present (06/23/2023)  Tobacco Use: High Risk (09/13/2024)    Readmission Risk Interventions     No data to display

## 2024-09-22 DIAGNOSIS — M79604 Pain in right leg: Secondary | ICD-10-CM | POA: Diagnosis not present

## 2024-09-22 MED ORDER — ENOXAPARIN SODIUM 40 MG/0.4ML IJ SOSY
40.0000 mg | PREFILLED_SYRINGE | INTRAMUSCULAR | Status: DC
  Administered 2024-09-22 – 2024-09-24 (×2): 40 mg via SUBCUTANEOUS
  Filled 2024-09-22 (×2): qty 0.4

## 2024-09-22 NOTE — Progress Notes (Signed)
 PROGRESS NOTE    Angel Riddle   FMW:[id number] DOB: 1956/05/27  DOA: 09/13/2024 Date of Service: 09/22/24 which is hospital day 9  PCP: [name], MD    Hospital course / significant events:   HPI: Angel Riddle is a 68 y.o. female with a PMH significant for chronic back pain secondary to disc herniation, MDD, hypertension, generalized anxiety disorder, GERD, PE in June 2025 on eliquis , presenting to the ED with worsening back pain and right leg pain. Last epidural injection about 3 weeks ago. This was second epidural.  Pt had back surgery in March 2025. First back surgery in 2018. Plan for further operative management in 12/2024. [name] is concerned about patient's mobility and her safety given she lives alone.  She does not report any injuries or falls. States has been getting worse over the last 2 months   10/09: to ED. Given she had a recent ED visit, [facility] was consulted for admission. No lab work or imaging done in the ED.  10/10: trial steroids, antiinflammatories, muscle relaxer, voltaren, lidocaine  topical, Lyrica . Qualifies for SNF rehab 10/11-10/12: stable thru weekend. Increased meds see order hx 10/13: significant increases to pain management, still poorly controlled. IR consulted to evaluate for possible injection  10/14: pain some improvement. Pending SNF rehab auth  10/15-10/16: pt ambulating occasionally but still having significant pain which prohibits walking / working w/ PT. awaiting injection Friday 10/17 10/17: injection today 10/18: pain improved, she is not needing po rx as much. Planning for SNF rehab      Consultants:  Palliative care - no formal consult Interventional Radiology    Procedures/Surgeries: 09/21/24  Technically successful injection consisting of a right L5 and right S1 nerve root blocks and transforaminal epidurals      ASSESSMENT & PLAN:   Back pain with radiculopathy and right leg pain secondary to degenerative  disc disease with herniation MRI performed on 08/30 showed L5-S1 disc protrusion with impingement of L5 and descending S1 nerve roots, resultant severe right lateral recess stenosis and right worse than left L5 foraminal narrowing.   Has been tentatively scheduled outpatient for further surgical intervention which is delayed due to being on DOAC for PE S/p IR injection L5 S1 Hydrocodone -APAP 10-325 mg 2 tabs q4h prn severe pain  IV Dilaudid  added for breakthrough pain. fentanyl  patch 25mcg  lidocaine  patches Lyrica  200mg  BID Robaxin  500 mg TID Steroid tapering topical voltaren  Topical lidocaine  patches  bowel regimen PT/OT- recommending SNF. Patient is agreeable to this and would like to pursue placement.  Chronic conditions: HTN: continue home meds (maxide 37.5-25 mg half tablet daily) HLD; continue home meds (zetia  every other day and Praluent  q2weeks) GERD: continue home meds (PPI) MDD: Continue home fluoxetine .  PE: eliquis  held. Last dose morning of 10/13 Anxiety: ativan  prn Insomnia: trazodone 100 mg at bedtime     overweight based on BMI: Body mass index is 28.98 kg/m.[name] Significantly low or high BMI is associated with higher medical risk.  Underweight - under 18  overweight - 25 to 29 obese - 30 or more Class 1 obesity: BMI of 30.0 to 34 Class 2 obesity: BMI of 35.0 to 39 Class 3 obesity: BMI of 40.0 to 49 Super Morbid Obesity: BMI 50-59 Super-super Morbid Obesity: BMI 60+ Healthy nutrition and physical activity advised as adjunct to other disease management and risk reduction treatments    DVT prophylaxis: eliquis  --> HELD for upcoming injection IV fluids: no continuous IV fluids  Nutrition: regular  Central lines / other devices: none  Code Status: DNR ACP documentation reviewed:  none on file in [facility]  [facility] needs: SNF rehab  Medical barriers to dispo: monitoring on pain meds but if no significant pain recurrence would be stable tomorrow medically               Subjective / Brief ROS:  Patient reports following the injection she is not needing po main meds as much. Still some twinges in the RLE calf area  Denies CP/SOB.  Pain controlled at this time  Denies new weakness.  Tolerating diet.    Family Communication: none at this time     Objective Findings:  Vitals:   09/21/24 1440 09/21/24 2007 09/22/24 0524 09/22/24 0825  BP: 108/63 139/82 132/81 121/70  Pulse: 94 85 82 91  Resp: 16 18 18 16   Temp: 98.6 F (37 C) 97.9 F (36.6 C) 98.1 F (36.7 C) 99.4 F (37.4 C)  TempSrc: Oral Oral Oral   SpO2: 96% 96% 97% 97%  Weight:      Height:       No intake or output data in the 24 hours ending 09/22/24 1338  Filed Weights   09/13/24 1024  Weight: 79 kg    Examination:  Physical Exam Constitutional:      General: She is not in acute distress. Cardiovascular:     Rate and Rhythm: Normal rate and regular rhythm.  Pulmonary:     Effort: Pulmonary effort is normal.     Breath sounds: Normal breath sounds.  Musculoskeletal:     Right lower leg: No edema.     Left lower leg: No edema.  Neurological:     Mental Status: She is alert.          Scheduled Medications:   Alirocumab   75 mg Subcutaneous Q14 Days   bupivacaine   10 mL Infiltration Once   ezetimibe   10 mg Oral Q M,W,F   fentaNYL   1 patch Transdermal Q72H   FLUoxetine   40 mg Oral Daily   lidocaine   2 patch Transdermal Q24H   lidocaine  1 %  5 mL Intradermal Once   methocarbamol   500 mg Oral TID   nicotine   14 mg Transdermal Daily   pantoprazole   40 mg Oral QHS   polyethylene glycol  17 g Oral BID   potassium chloride   10 mEq Oral Daily   pregabalin   200 mg Oral BID   traZODone  100 mg Oral QHS    Continuous Infusions:   PRN Medications:  HYDROcodone -acetaminophen , LORazepam , melatonin, mouth rinse  Antimicrobials from admission:  Anti-infectives (From admission, onward)    None           Data Reviewed:  I have  personally reviewed the following...  CBC: No results for input(s): WBC, NEUTROABS, HGB, HCT, MCV, PLT in the last 168 hours.  Basic Metabolic Panel: No results for input(s): NA, K, CL, CO2, GLUCOSE, BUN, CREATININE, CALCIUM, MG, PHOS in the last 168 hours.  GFR: Estimated Creatinine Clearance: 69.9 mL/min (by C-G formula based on SCr of 0.79 mg/dL). Liver Function Tests: No results for input(s): AST, ALT, ALKPHOS, BILITOT, PROT, ALBUMIN  in the last 168 hours. No results for input(s): LIPASE, AMYLASE in the last 168 hours. No results for input(s): AMMONIA in the last 168 hours. Coagulation Profile: No results for input(s): INR, PROTIME in the last 168 hours.  Cardiac Enzymes: No results for input(s): CKTOTAL, CKMB, CKMBINDEX, TROPONINI in the last 168 hours. BNP (last  3 results) No results for input(s): PROBNP in the last 8760 hours. HbA1C: No results for input(s): HGBA1C in the last 72 hours. CBG: No results for input(s): GLUCAP in the last 168 hours. Lipid Profile: No results for input(s): CHOL, HDL, LDLCALC, TRIG, CHOLHDL, LDLDIRECT in the last 72 hours. Thyroid  Function Tests: No results for input(s): TSH, T4TOTAL, FREET4, T3FREE, THYROIDAB in the last 72 hours. Anemia Panel: No results for input(s): VITAMINB12, FOLATE, FERRITIN, TIBC, IRON, RETICCTPCT in the last 72 hours. Most Recent Urinalysis On File:     Component Value Date/Time   COLORURINE YELLOW (A) 09/08/2024 1303   APPEARANCEUR CLOUDY (A) 09/08/2024 1303   APPEARANCEUR Clear 09/07/2024 1442   LABSPEC 1.013 09/08/2024 1303   LABSPEC 1.008 01/02/2012 1820   PHURINE 7.0 09/08/2024 1303   GLUCOSEU NEGATIVE 09/08/2024 1303   GLUCOSEU NEGATIVE 05/30/2024 1444   HGBUR NEGATIVE 09/08/2024 1303   BILIRUBINUR NEGATIVE 09/08/2024 1303   BILIRUBINUR Negative 09/07/2024 1442   BILIRUBINUR Negative 01/02/2012 1820    KETONESUR NEGATIVE 09/08/2024 1303   PROTEINUR NEGATIVE 09/08/2024 1303   UROBILINOGEN 0.2 05/30/2024 1444   NITRITE POSITIVE (A) 09/08/2024 1303   LEUKOCYTESUR MODERATE (A) 09/08/2024 1303   LEUKOCYTESUR Negative 01/02/2012 1820   Sepsis Labs: @LABRCNTIP (procalcitonin:4,lacticidven:4) Microbiology: No results found for this or any previous visit (from the past 240 hours).    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 She has a significant central to right subarticular disc protrusion with superior migration at L5-S1 affecting the right L5 and S1 nerve roots. She has had some relief to prior selective nerve root blocks performed at an outside institution. She presents now for selective nerve root block and transforaminal steroid injection at L5 and S1. EXAM: TRANSFORAMINAL EPIDURAL/NERVE ROOT BLOCK TRANSFORAMINAL EPIDURAL/NERVE ROOT BLOCK PROCEDURE: The procedure, risks, benefits, and alternatives were explained to the patient. Questions regarding the procedure were encouraged and answered. The patient understands and consents to the procedure. A posterior oblique approach was taken to the intervertebral foramen on the right at L5 using a curved 22 gauge spinal needle. Injection of Isovue -M 200 outlined the right L5 nerve root and showed good epidural spread. No vascular opacification is seen. Sixtymg of Depo-Medrol mixed with 1 mL 1% lidocaine  were instilled. A posterior oblique approach was taken to the intervertebral foramen on the right at S1 using a curved 22 gauge spinal needle. Injection of Isovue -M 200 outlined the right S1 nerve root and showed good epidural spread. No vascular opacification is seen. Sixtymg of Depo-Medrol mixed with 1 mL 1% lidocaine  were instilled. The procedure was well-tolerated. COMPARISON:  None Available. IMPRESSION: Technically  successful injection consisting of a right L5 nerve root block and transforaminal epidural. Technically successful injection consisting of a right S1 nerve root block and transforaminal epidural. Electronically Signed   By: Wilkie Lent M.D.   On: 09/21/2024 16:52        Morgan Rennert, DO [facility] 09/22/2024, 1:38 PM    Dictation software may have been used to generate the above note. Typos may occur and escape review in typed/dictated notes. Please contact Dr Marsa directly for clarity if needed.  Staff may message me via secure chat in Epic  but this may not receive an immediate response,  please page me for urgent matters!  If 7PM-7AM, please contact night coverage [email or web address]

## 2024-09-22 NOTE — Plan of Care (Signed)

## 2024-09-23 DIAGNOSIS — M79604 Pain in right leg: Secondary | ICD-10-CM | POA: Diagnosis not present

## 2024-09-23 NOTE — Plan of Care (Signed)
  Problem: Education: Goal: Knowledge of General Education information will improve Description: Including pain rating scale, medication(s)/side effects and non-pharmacologic comfort measures Outcome: Progressing   Problem: Health Behavior/Discharge Planning: Goal: Ability to manage health-related needs will improve Outcome: Progressing   Problem: Clinical Measurements: Goal: Ability to maintain clinical measurements within normal limits will improve Outcome: Progressing Goal: Will remain free from infection Outcome: Progressing Goal: Diagnostic test results will improve Outcome: Progressing Goal: Respiratory complications will improve Outcome: Progressing Goal: Cardiovascular complication will be avoided Outcome: Progressing   Problem: Activity: Goal: Risk for activity intolerance will decrease Outcome: Progressing   Problem: Nutrition: Goal: Adequate nutrition will be maintained Outcome: Progressing   Problem: Elimination: Goal: Will not experience complications related to bowel motility Outcome: Progressing Goal: Will not experience complications related to urinary retention Outcome: Progressing   Problem: Pain Managment: Goal: General experience of comfort will improve and/or be controlled Outcome: Progressing

## 2024-09-23 NOTE — Progress Notes (Signed)
 PROGRESS NOTE    Angel Riddle   FMW:[id number] DOB: [birth year]  DOA: 09/13/2024 Date of Service: 09/23/24 which is hospital day 10  PCP: [name], MD    Hospital course / significant events:   HPI: Angel Riddle is a 68 y.o. female with a PMH significant for chronic back pain secondary to disc herniation, MDD, hypertension, generalized anxiety disorder, GERD, PE in June 2025 on eliquis , presenting to the ED with worsening back pain and right leg pain. Last epidural injection about 3 weeks ago. This was second epidural.  Pt had back surgery in March 2025. First back surgery in 2018. Plan for further operative management in 12/2024. [name] is concerned about patient's mobility and her safety given she lives alone.  She does not report any injuries or falls. States has been getting worse over the last 2 months   10/09: to ED. Given she had a recent ED visit, [facility] was consulted for admission. No lab work or imaging done in the ED.  10/10: trial steroids, antiinflammatories, muscle relaxer, voltaren, lidocaine  topical, Lyrica . Qualifies for SNF rehab 10/11-10/12: stable thru weekend. Increased meds see order hx 10/13: significant increases to pain management, still poorly controlled. IR consulted to evaluate for possible injection - awaiting injection Friday 10/17 following Eliquis  washout 10/14: pain some improvement. Pending SNF rehab auth  10/15-10/16: pt ambulating occasionally but still having significant pain which prohibits walking / working w/ PT.  10/17: injection today 10/18-10/19: pain improved, she is not needing po prn rx nearly as much. Planning for SNF rehab      Consultants:  Palliative care - no formal consult Interventional Radiology    Procedures/Surgeries: 09/21/24  Technically successful injection consisting of a right L5 and right S1 nerve root blocks and transforaminal epidurals      ASSESSMENT & PLAN:   Back pain with radiculopathy  and right leg pain secondary to degenerative disc disease with herniation MRI performed on 08/30 showed L5-S1 disc protrusion with impingement of L5 and descending S1 nerve roots, resultant severe right lateral recess stenosis and right worse than left L5 foraminal narrowing.   Has been tentatively scheduled outpatient for further surgical intervention which is delayed due to being on DOAC for PE S/p IR injection L5 S1 fentanyl  patch 25mcg  Hydrocodone -APAP 10-325 mg 2 tabs q4h prn severe pain - was needing this 4-5 times per day prior to injection, now at about once per day. Encouraged to minimize use and consider pre-dose prior to activity she knows will exacerbate back/leg pain  Lyrica  200mg  BID Robaxin  500 mg TID topical voltaren  Topical lidocaine  patches bowel regimen PT/OT- recommending SNF. Patient is agreeable to this and would like to pursue placement. Steroid tapered OFF at this time   Chronic conditions: HTN: continue home meds (maxide 37.5-25 mg half tablet daily) HLD; continue home meds (zetia  every other day and Praluent  q2weeks) GERD: continue home meds (PPI) MDD: Continue home fluoxetine .  PE: eliquis  held. Last dose morning of 10/13 Anxiety: ativan  prn Insomnia: trazodone 100 mg at bedtime     overweight based on BMI: Body mass index is 28.98 kg/m.Angel Riddle Significantly low or high BMI is associated with higher medical risk.  Underweight - under 18  overweight - 25 to 29 obese - 30 or more Class 1 obesity: BMI of 30.0 to 34 Class 2 obesity: BMI of 35.0 to 39 Class 3 obesity: BMI of 40.0 to 49 Super Morbid Obesity: BMI 50-59 Super-super Morbid Obesity: BMI 60+ Healthy  nutrition and physical activity advised as adjunct to other disease management and risk reduction treatments    DVT prophylaxis: eliquis  --> HELD for upcoming injection IV fluids: no continuous IV fluids  Nutrition: regular Central lines / other devices: none  Code Status: DNR ACP documentation  reviewed:  none on file in [facility]  TOC needs: SNF rehab  Medical barriers to dispo: monitoring on pain meds but if no significant pain recurrence would be stable tomorrow medically              Subjective / Brief ROS:  Patient reports following the injection she is not needing po main meds as much yesterday or today. Still some twinges in the RLE calf area about same  Denies CP/SOB.  Pain controlled at this time  Denies new weakness.  Tolerating diet.    Family Communication: none at this time     Objective Findings:  Vitals:   09/22/24 1442 09/22/24 1937 09/23/24 0432 09/23/24 0820  BP: 120/68 117/67 123/75 121/72  Pulse: 95 100 89 90  Resp: 16 18 16 17   Temp: 99.3 F (37.4 C) 98.7 F (37.1 C) 98 F (36.7 C) 98.4 F (36.9 C)  TempSrc: Oral Oral Oral   SpO2: 96% 96% 93% 94%  Weight:      Height:        Intake/Output Summary (Last 24 hours) at 09/23/2024 1436 Last data filed at 09/23/2024 1300 Gross per 24 hour  Intake 720 ml  Output --  Net 720 ml    Filed Weights   09/13/24 1024  Weight: 79 kg    Examination:  Physical Exam Constitutional:      General: She is not in acute distress. Cardiovascular:     Rate and Rhythm: Normal rate and regular rhythm.  Pulmonary:     Effort: Pulmonary effort is normal.     Breath sounds: Normal breath sounds.  Musculoskeletal:     Right lower leg: No edema.     Left lower leg: No edema.  Neurological:     Mental Status: She is alert.          Scheduled Medications:   Alirocumab   75 mg Subcutaneous Q14 Days   bupivacaine   10 mL Infiltration Once   enoxaparin (LOVENOX) injection  40 mg Subcutaneous Q24H   ezetimibe   10 mg Oral Q M,W,F   fentaNYL   1 patch Transdermal Q72H   FLUoxetine   40 mg Oral Daily   lidocaine   2 patch Transdermal Q24H   lidocaine  1 %  5 mL Intradermal Once   methocarbamol   500 mg Oral TID   nicotine   14 mg Transdermal Daily   pantoprazole   40 mg Oral QHS   polyethylene  glycol  17 g Oral BID   potassium chloride   10 mEq Oral Daily   pregabalin   200 mg Oral BID   traZODone  100 mg Oral QHS    Continuous Infusions:   PRN Medications:  HYDROcodone -acetaminophen , LORazepam , melatonin, mouth rinse  Antimicrobials from admission:  Anti-infectives (From admission, onward)    None           Data Reviewed:  I have personally reviewed the following...  CBC: No results for input(s): WBC, NEUTROABS, HGB, HCT, MCV, PLT in the last 168 hours.  Basic Metabolic Panel: No results for input(s): NA, K, CL, CO2, GLUCOSE, BUN, CREATININE, CALCIUM, MG, PHOS in the last 168 hours.  GFR: Estimated Creatinine Clearance: 69.9 mL/min (by C-G formula based on SCr of 0.79 mg/dL).  Liver Function Tests: No results for input(s): AST, ALT, ALKPHOS, BILITOT, PROT, ALBUMIN  in the last 168 hours. No results for input(s): LIPASE, AMYLASE in the last 168 hours. No results for input(s): AMMONIA in the last 168 hours. Coagulation Profile: No results for input(s): INR, PROTIME in the last 168 hours.  Cardiac Enzymes: No results for input(s): CKTOTAL, CKMB, CKMBINDEX, TROPONINI in the last 168 hours. BNP (last 3 results) No results for input(s): PROBNP in the last 8760 hours. HbA1C: No results for input(s): HGBA1C in the last 72 hours. CBG: No results for input(s): GLUCAP in the last 168 hours. Lipid Profile: No results for input(s): CHOL, HDL, LDLCALC, TRIG, CHOLHDL, LDLDIRECT in the last 72 hours. Thyroid  Function Tests: No results for input(s): TSH, T4TOTAL, FREET4, T3FREE, THYROIDAB in the last 72 hours. Anemia Panel: No results for input(s): VITAMINB12, FOLATE, FERRITIN, TIBC, IRON, RETICCTPCT in the last 72 hours. Most Recent Urinalysis On File:     Component Value Date/Time   COLORURINE YELLOW (A) 09/08/2024 1303   APPEARANCEUR CLOUDY (A) 09/08/2024 1303    APPEARANCEUR Clear 09/07/2024 1442   LABSPEC 1.013 09/08/2024 1303   LABSPEC 1.008 01/02/2012 1820   PHURINE 7.0 09/08/2024 1303   GLUCOSEU NEGATIVE 09/08/2024 1303   GLUCOSEU NEGATIVE 05/30/2024 1444   HGBUR NEGATIVE 09/08/2024 1303   BILIRUBINUR NEGATIVE 09/08/2024 1303   BILIRUBINUR Negative 09/07/2024 1442   BILIRUBINUR Negative 01/02/2012 1820   KETONESUR NEGATIVE 09/08/2024 1303   PROTEINUR NEGATIVE 09/08/2024 1303   UROBILINOGEN 0.2 05/30/2024 1444   NITRITE POSITIVE (A) 09/08/2024 1303   LEUKOCYTESUR MODERATE (A) 09/08/2024 1303   LEUKOCYTESUR Negative 01/02/2012 1820   Sepsis Labs: @LABRCNTIP (procalcitonin:4,lacticidven:4) Microbiology: No results found for this or any previous visit (from the past 240 hours).    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 She has a significant central to right subarticular disc protrusion with superior migration at L5-S1 affecting the right L5 and S1 nerve roots. She has had some relief to prior selective nerve root blocks performed at an outside institution. She presents now for selective nerve root block and transforaminal steroid injection at L5 and S1. EXAM: TRANSFORAMINAL EPIDURAL/NERVE ROOT BLOCK TRANSFORAMINAL EPIDURAL/NERVE ROOT BLOCK PROCEDURE: The procedure, risks, benefits, and alternatives were explained to the patient. Questions regarding the procedure were encouraged and answered. The patient understands and consents to the procedure. A posterior oblique approach was taken to the intervertebral foramen on the right at L5 using a curved 22 gauge spinal needle. Injection of Isovue -M 200 outlined the right L5 nerve root and showed good epidural spread. No vascular opacification is seen. Sixtymg of Depo-Medrol mixed with 1 mL 1% lidocaine  were instilled. A posterior oblique approach was taken to the  intervertebral foramen on the right at S1 using a curved 22 gauge spinal needle. Injection of Isovue -M 200 outlined the right S1 nerve root and showed good epidural spread. No vascular opacification is seen. Sixtymg of Depo-Medrol mixed with 1 mL 1% lidocaine  were instilled. The procedure was well-tolerated. COMPARISON:  None Available. IMPRESSION: Technically successful injection consisting of a right L5 nerve root block and transforaminal epidural. Technically successful injection consisting of a right S1 nerve root block and transforaminal epidural. Electronically Signed   By: Wilkie Lent M.D.   On: 09/21/2024 16:52        Laneta Blunt, DO [facility] 09/23/2024, 2:36 PM    Dictation software may have  been used to generate the above note. Typos may occur and escape review in typed/dictated notes. Please contact Dr Marsa directly for clarity if needed.  Staff may message me via secure chat in Epic  but this may not receive an immediate response,  please page me for urgent matters!  If 7PM-7AM, please contact night coverage [email or web address]

## 2024-09-23 NOTE — Plan of Care (Signed)

## 2024-09-24 DIAGNOSIS — M79604 Pain in right leg: Secondary | ICD-10-CM | POA: Diagnosis not present

## 2024-09-24 NOTE — Plan of Care (Signed)

## 2024-09-24 NOTE — Plan of Care (Signed)

## 2024-09-24 NOTE — Progress Notes (Signed)
 Physical Therapy Treatment Patient Details Name: Angel Riddle MRN: [id number] DOB: 06-Jul-1956 Today's Date: 09/24/2024   History of Present Illness Pt is a 68 y/o F presenting to ED with c/o worsening back and R leg pain. MD assessment includes right leg pain secondary to degenerative disc disease. PMH significant for chronic back pain, MDD, HTN, GAD, GERD.    PT Comments  Pt received in bed agreeable to PT session. Pt reports pain level has improved since steroid injection 3 days ago. Continues to rely heavily on bed rail and RW for transfers and mobility. Two occasions of Right knee buckling while gait training, able to self correct with CGA. Pt continues to progress well. Anticipate transition to [facility] tomorrow for continued PT services prior to returning home.    If plan is discharge home, recommend the following: A little help with walking and/or transfers;A little help with bathing/dressing/bathroom;Assist for transportation   Can travel by private vehicle     Yes  Equipment Recommendations  Other (comment) (TBD at next level of care)    Recommendations for Other Services       Precautions / Restrictions Precautions Precautions: Fall Recall of Precautions/Restrictions: Intact Restrictions Weight Bearing Restrictions Per Provider Order: No     Mobility  Bed Mobility Overal bed mobility: Needs Assistance Bed Mobility: Rolling, Sidelying to Sit, Sit to Sidelying Rolling: Supervision, Used rails Sidelying to sit: Supervision, HOB elevated, Used rails     Sit to sidelying: Supervision, HOB elevated, Used rails General bed mobility comments: no physical assistance required, use of bed features to assist    Transfers Overall transfer level: Needs assistance Equipment used: Rolling walker (2 wheels) Transfers: Sit to/from Stand Sit to Stand: Contact guard assist           General transfer comment:  (CGA to stand from toilet with heavy assist from wall  railing)    Ambulation/Gait Ambulation/Gait assistance: Contact guard assist Gait Distance (Feet):  (80) Assistive device: Rolling walker (2 wheels) Gait Pattern/deviations: Decreased stance time - right, Step-to pattern, Decreased step length - left, Decreased weight shift to right Gait velocity: decreased     General Gait Details: 2 Occasions of Right knee buckling, able to self correct with support of RW   Stairs             Wheelchair Mobility     Tilt Bed    Modified Rankin (Stroke Patients Only)       Balance Overall balance assessment: Needs assistance Sitting-balance support: Feet supported Sitting balance-Leahy Scale: Normal Sitting balance - Comments: steady static and dynamic sitting   Standing balance support: Bilateral upper extremity supported, During functional activity, Reliant on assistive device for balance Standing balance-Leahy Scale: Fair                              Hotel manager: No apparent difficulties  Cognition Arousal: Alert Behavior During Therapy: WFL for tasks assessed/performed   PT - Cognitive impairments: No apparent impairments                       PT - Cognition Comments: A&Ox4 Following commands: Intact      Cueing Cueing Techniques: Verbal cues, Tactile cues, Visual cues  Exercises      General Comments General comments (skin integrity, edema, etc.):  (Lidoderm  patch to Right calf)      Pertinent Vitals/Pain Pain Assessment Pain Assessment: 0-10  Pain Score: 3  Pain Location: R lower leg, with WB Pain Descriptors / Indicators: Discomfort, Sharp Pain Intervention(s): Monitored during session    Home Living                          Prior Function            PT Goals (current goals can now be found in the care plan section) Acute Rehab PT Goals Patient Stated Goal: to get her pain under control Progress towards PT goals: Progressing toward  goals    Frequency    Min 2X/week      PT Plan      Co-evaluation              AM-PAC PT 6 Clicks Mobility   Outcome Measure  Help needed turning from your back to your side while in a flat bed without using bedrails?: A Little Help needed moving from lying on your back to sitting on the side of a flat bed without using bedrails?: A Little Help needed moving to and from a bed to a chair (including a wheelchair)?: A Little Help needed standing up from a chair using your arms (e.g., wheelchair or bedside chair)?: A Little Help needed to walk in hospital room?: A Little Help needed climbing 3-5 steps with a railing? : A Lot 6 Click Score: 17    End of Session Equipment Utilized During Treatment: Gait belt Activity Tolerance: Patient tolerated treatment well Patient left: in bed;with call bell/phone within reach;with family/visitor present Nurse Communication: Mobility status PT Visit Diagnosis: Unsteadiness on feet (R26.81);Muscle weakness (generalized) (M62.81);Difficulty in walking, not elsewhere classified (R26.2);Pain Pain - Right/Left: Right Pain - part of body: Leg     Time: 1400-1418 PT Time Calculation (min) (ACUTE ONLY): 18 min  Charges:    $Gait Training: 8-22 mins PT General Charges $$ ACUTE PT VISIT: 1 Visit                    Darice Bohr, PTA  [name] 09/24/2024, 4:11 PM

## 2024-09-24 NOTE — TOC Progression Note (Signed)
 Transition of Care [facility]) - Progression Note    Patient Details  Name: Angel Riddle MRN: [id number] Date of Birth: September 06, 1956  [facility] CM/SW Contact  Alvaro Louder, [place] Phone Number: 09/24/2024, 11:34 AM  Clinical Narrative:   Shara approved for patient to admit to [facility] tomorrow. Approval Number: [id number]  TOC to follow for discharge                      Expected Discharge Plan and Services         Expected Discharge Date: 09/23/24                                     Social Drivers of Health (SDOH) Interventions SDOH Screenings   Food Insecurity: No Food Insecurity (09/13/2024)  Housing: Low Risk  (09/13/2024)  Transportation Needs: No Transportation Needs (09/13/2024)  Utilities: Not At Risk (09/13/2024)  Alcohol Screen: Low Risk  (06/23/2023)  Depression (PHQ2-9): High Risk (09/07/2024)  Financial Resource Strain: Low Risk  (09/02/2024)   Received from [facility]  Physical Activity: Unknown (06/23/2023)  Social Connections: Moderately Integrated (09/13/2024)  Stress: No Stress Concern Present (06/23/2023)  Tobacco Use: High Risk (09/13/2024)    Readmission Risk Interventions     No data to display

## 2024-09-24 NOTE — Progress Notes (Signed)
 PROGRESS NOTE    Angel Riddle   FMW:[id number] DOB: Nov 18, 1956  DOA: 09/13/2024 Date of Service: 09/24/24 which is hospital day 11  PCP: [name], MD    Hospital course / significant events:   HPI: Angel Riddle is a 68 y.o. female with a PMH significant for chronic back pain secondary to disc herniation, MDD, hypertension, generalized anxiety disorder, GERD, PE in June 2025 on eliquis , presenting to the ED with worsening back pain and right leg pain. Last epidural injection about 3 weeks ago. This was second epidural.  Pt had back surgery in March 2025. First back surgery in 2018. Plan for further operative management in 12/2024. [name] is concerned about patient's mobility and her safety given she lives alone.  She does not report any injuries or falls. States has been getting worse over the last 2 months   10/09: to ED. Given she had a recent ED visit, [facility] was consulted for admission. No lab work or imaging done in the ED.  10/10: trial steroids, antiinflammatories, muscle relaxer, voltaren, lidocaine  topical, Lyrica . Qualifies for SNF rehab 10/11-10/12: stable thru weekend. Increased meds see order hx 10/13: significant increases to pain management, still poorly controlled. IR consulted to evaluate for possible injection - awaiting injection Friday 10/17 following Eliquis  washout 10/14: pain some improvement. Pending SNF rehab auth  10/15-10/16: pt ambulating occasionally but still having significant pain which prohibits walking / working w/ PT.  10/17: injection today 10/18-10/19: pain improved, she is not needing po prn rx nearly as much. Planning for SNF rehab  10/20: approved to go to rehab tomorrow. Sent message to PCP re: consideration for continuation fentanyl  patches outpatient      Consultants:  Palliative care - no formal consult Interventional Radiology    Procedures/Surgeries: 09/21/24  Technically successful injection consisting of a right L5  and right S1 nerve root blocks and transforaminal epidurals      ASSESSMENT & PLAN:   Back pain with radiculopathy and right leg pain secondary to degenerative disc disease with herniation MRI performed on 08/30 showed L5-S1 disc protrusion with impingement of L5 and descending S1 nerve roots, resultant severe right lateral recess stenosis and right worse than left L5 foraminal narrowing.   Has been tentatively scheduled outpatient for further surgical intervention which is delayed due to being on DOAC for PE S/p IR injection L5 S1 fentanyl  patch 25mcg  Hydrocodone -APAP 10-325 mg 2 tabs q4h prn severe pain - was needing 2 tabs 4-5 times per day prior to injection, now at about 1-2 tabs once per day. Encouraged to minimize use and consider pre-dose prior to activity she knows will exacerbate back/leg pain  Lyrica  200mg  BID Robaxin  500 mg TID topical voltaren  Topical lidocaine  patches bowel regimen PT/OT- recommending SNF. Patient is agreeable to this and would like to pursue placement. Steroid tapered OFF at this time   Chronic conditions: HTN: continue home meds (maxide 37.5-25 mg half tablet daily) HLD; continue home meds (zetia  every other day and Praluent  q2weeks) GERD: continue home meds (PPI) MDD: Continue home fluoxetine .  PE: eliquis  held. Last dose morning of 10/13 Anxiety: ativan  prn Insomnia: trazodone 100 mg at bedtime     overweight based on BMI: Body mass index is 28.98 kg/m.[name] Significantly low or high BMI is associated with higher medical risk.  Underweight - under 18  overweight - 25 to 29 obese - 30 or more Class 1 obesity: BMI of 30.0 to 34 Class 2 obesity: BMI of  35.0 to 39 Class 3 obesity: BMI of 40.0 to 49 Super Morbid Obesity: BMI 50-59 Super-super Morbid Obesity: BMI 60+ Healthy nutrition and physical activity advised as adjunct to other disease management and risk reduction treatments    DVT prophylaxis: eliquis  --> HELD for upcoming  injection IV fluids: no continuous IV fluids  Nutrition: regular Central lines / other devices: none  Code Status: DNR ACP documentation reviewed:  none on file in [facility]  [facility] needs: SNF rehab  Medical barriers to dispo: none             Subjective / Brief ROS:  Yesterday or today. Still some twinges in the RLE calf area about same as yesterday but overall better following injection  Denies CP/SOB.  Pain controlled at this time  Denies new weakness.  Tolerating diet.    Family Communication: none at this time     Objective Findings:  Vitals:   09/23/24 1542 09/23/24 2004 09/24/24 0455 09/24/24 0851  BP: 117/60 (!) 131/58 111/63 (!) 141/92  Pulse: 85 97 85 95  Resp: 18 18 18 18   Temp: 98.4 F (36.9 C) 98.3 F (36.8 C) (!) 97.4 F (36.3 C) 98.5 F (36.9 C)  TempSrc: Oral  Oral Oral  SpO2: 96% 96% 96% 98%  Weight:      Height:        Intake/Output Summary (Last 24 hours) at 09/24/2024 1626 Last data filed at 09/23/2024 1700 Gross per 24 hour  Intake 480 ml  Output --  Net 480 ml    Filed Weights   09/13/24 1024  Weight: 79 kg    Examination:  Physical Exam Constitutional:      General: She is not in acute distress. Cardiovascular:     Rate and Rhythm: Normal rate and regular rhythm.  Pulmonary:     Effort: Pulmonary effort is normal.     Breath sounds: Normal breath sounds.  Musculoskeletal:     Right lower leg: No edema.     Left lower leg: No edema.  Neurological:     Mental Status: She is alert.          Scheduled Medications:   Alirocumab   75 mg Subcutaneous Q14 Days   bupivacaine   10 mL Infiltration Once   enoxaparin (LOVENOX) injection  40 mg Subcutaneous Q24H   ezetimibe   10 mg Oral Q M,W,F   fentaNYL   1 patch Transdermal Q72H   FLUoxetine   40 mg Oral Daily   lidocaine   2 patch Transdermal Q24H   lidocaine  1 %  5 mL Intradermal Once   methocarbamol   500 mg Oral TID   nicotine   14 mg Transdermal Daily   pantoprazole    40 mg Oral QHS   polyethylene glycol  17 g Oral BID   potassium chloride   10 mEq Oral Daily   pregabalin   200 mg Oral BID   traZODone  100 mg Oral QHS    Continuous Infusions:   PRN Medications:  HYDROcodone -acetaminophen , LORazepam , melatonin, mouth rinse  Antimicrobials from admission:  Anti-infectives (From admission, onward)    None           Data Reviewed:  I have personally reviewed the following...  CBC: No results for input(s): WBC, NEUTROABS, HGB, HCT, MCV, PLT in the last 168 hours.  Basic Metabolic Panel: No results for input(s): NA, K, CL, CO2, GLUCOSE, BUN, CREATININE, CALCIUM, MG, PHOS in the last 168 hours.  GFR: Estimated Creatinine Clearance: 69.9 mL/min (by C-G formula based on SCr  of 0.79 mg/dL). Liver Function Tests: No results for input(s): AST, ALT, ALKPHOS, BILITOT, PROT, ALBUMIN  in the last 168 hours. No results for input(s): LIPASE, AMYLASE in the last 168 hours. No results for input(s): AMMONIA in the last 168 hours. Coagulation Profile: No results for input(s): INR, PROTIME in the last 168 hours.  Cardiac Enzymes: No results for input(s): CKTOTAL, CKMB, CKMBINDEX, TROPONINI in the last 168 hours. BNP (last 3 results) No results for input(s): PROBNP in the last 8760 hours. HbA1C: No results for input(s): HGBA1C in the last 72 hours. CBG: No results for input(s): GLUCAP in the last 168 hours. Lipid Profile: No results for input(s): CHOL, HDL, LDLCALC, TRIG, CHOLHDL, LDLDIRECT in the last 72 hours. Thyroid  Function Tests: No results for input(s): TSH, T4TOTAL, FREET4, T3FREE, THYROIDAB in the last 72 hours. Anemia Panel: No results for input(s): VITAMINB12, FOLATE, FERRITIN, TIBC, IRON, RETICCTPCT in the last 72 hours. Most Recent Urinalysis On File:     Component Value Date/Time   COLORURINE YELLOW (A) 09/08/2024 1303    APPEARANCEUR CLOUDY (A) 09/08/2024 1303   APPEARANCEUR Clear 09/07/2024 1442   LABSPEC 1.013 09/08/2024 1303   LABSPEC 1.008 01/02/2012 1820   PHURINE 7.0 09/08/2024 1303   GLUCOSEU NEGATIVE 09/08/2024 1303   GLUCOSEU NEGATIVE 05/30/2024 1444   HGBUR NEGATIVE 09/08/2024 1303   BILIRUBINUR NEGATIVE 09/08/2024 1303   BILIRUBINUR Negative 09/07/2024 1442   BILIRUBINUR Negative 01/02/2012 1820   KETONESUR NEGATIVE 09/08/2024 1303   PROTEINUR NEGATIVE 09/08/2024 1303   UROBILINOGEN 0.2 05/30/2024 1444   NITRITE POSITIVE (A) 09/08/2024 1303   LEUKOCYTESUR MODERATE (A) 09/08/2024 1303   LEUKOCYTESUR Negative 01/02/2012 1820   Sepsis Labs: @LABRCNTIP (procalcitonin:4,lacticidven:4) Microbiology: No results found for this or any previous visit (from the past 240 hours).    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 She has a significant central to right subarticular disc protrusion with superior migration at L5-S1 affecting the right L5 and S1 nerve roots. She has had some relief to prior selective nerve root blocks performed at an outside institution. She presents now for selective nerve root block and transforaminal steroid injection at L5 and S1. EXAM: TRANSFORAMINAL EPIDURAL/NERVE ROOT BLOCK TRANSFORAMINAL EPIDURAL/NERVE ROOT BLOCK PROCEDURE: The procedure, risks, benefits, and alternatives were explained to the patient. Questions regarding the procedure were encouraged and answered. The patient understands and consents to the procedure. A posterior oblique approach was taken to the intervertebral foramen on the right at L5 using a curved 22 gauge spinal needle. Injection of Isovue -M 200 outlined the right L5 nerve root and showed good epidural spread. No vascular opacification is seen. Sixtymg of Depo-Medrol mixed with 1 mL 1% lidocaine  were instilled. A  posterior oblique approach was taken to the intervertebral foramen on the right at S1 using a curved 22 gauge spinal needle. Injection of Isovue -M 200 outlined the right S1 nerve root and showed good epidural spread. No vascular opacification is seen. Sixtymg of Depo-Medrol mixed with 1 mL 1% lidocaine  were instilled. The procedure was well-tolerated. COMPARISON:  None Available. IMPRESSION: Technically successful injection consisting of a right L5 nerve root block and transforaminal epidural. Technically successful injection consisting of a right S1 nerve root block and transforaminal epidural. Electronically Signed   By: Wilkie Lent M.D.   On: 09/21/2024 16:52        Laneta Blunt, DO [facility] 09/24/2024, 4:26 PM    Dictation  software may have been used to generate the above note. Typos may occur and escape review in typed/dictated notes. Please contact Dr Marsa directly for clarity if needed.  Staff may message me via secure chat in Epic  but this may not receive an immediate response,  please page me for urgent matters!  If 7PM-7AM, please contact night coverage [email or web address]

## 2024-09-25 DIAGNOSIS — E785 Hyperlipidemia, unspecified: Secondary | ICD-10-CM | POA: Diagnosis not present

## 2024-09-25 DIAGNOSIS — K2100 Gastro-esophageal reflux disease with esophagitis, without bleeding: Secondary | ICD-10-CM | POA: Diagnosis not present

## 2024-09-25 DIAGNOSIS — E875 Hyperkalemia: Secondary | ICD-10-CM | POA: Diagnosis not present

## 2024-09-25 DIAGNOSIS — M48061 Spinal stenosis, lumbar region without neurogenic claudication: Secondary | ICD-10-CM | POA: Diagnosis not present

## 2024-09-25 DIAGNOSIS — M79604 Pain in right leg: Secondary | ICD-10-CM | POA: Diagnosis not present

## 2024-09-25 DIAGNOSIS — M62830 Muscle spasm of back: Secondary | ICD-10-CM | POA: Diagnosis not present

## 2024-09-25 DIAGNOSIS — F17200 Nicotine dependence, unspecified, uncomplicated: Secondary | ICD-10-CM | POA: Diagnosis not present

## 2024-09-25 DIAGNOSIS — G600 Hereditary motor and sensory neuropathy: Secondary | ICD-10-CM | POA: Diagnosis not present

## 2024-09-25 DIAGNOSIS — G4700 Insomnia, unspecified: Secondary | ICD-10-CM | POA: Diagnosis not present

## 2024-09-25 MED ORDER — FENTANYL 25 MCG/HR TD PT72: 1.0000 | MEDICATED_PATCH | TRANSDERMAL | 0 refills | Status: AC

## 2024-09-25 MED ORDER — HYDROCODONE-ACETAMINOPHEN 10-325 MG PO TABS: 1.0000 | ORAL_TABLET | Freq: Four times a day (QID) | ORAL | Status: DC | PRN

## 2024-09-25 MED ORDER — ALPRAZOLAM 0.5 MG PO TABS: 0.5000 mg | ORAL_TABLET | Freq: Two times a day (BID) | ORAL | 0 refills | Status: DC | PRN

## 2024-09-25 MED ORDER — METHOCARBAMOL 500 MG PO TABS: 500.0000 mg | ORAL_TABLET | Freq: Three times a day (TID) | ORAL | Status: DC

## 2024-09-25 MED ORDER — PREGABALIN 200 MG PO CAPS: 200.0000 mg | ORAL_CAPSULE | Freq: Two times a day (BID) | ORAL | 0 refills | Status: DC

## 2024-09-25 MED ORDER — MELATONIN 5 MG PO TABS: 5.0000 mg | ORAL_TABLET | Freq: Every evening | ORAL | Status: DC | PRN

## 2024-09-25 MED ORDER — PREGABALIN 200 MG PO CAPS: 200.0000 mg | ORAL_CAPSULE | Freq: Two times a day (BID) | ORAL | Status: DC

## 2024-09-25 MED ORDER — LIDOCAINE 5 % EX PTCH: 2.0000 | MEDICATED_PATCH | CUTANEOUS | Status: AC

## 2024-09-25 MED ORDER — NYSTATIN 100000 UNIT/ML MT SUSP: 5.0000 mL | Freq: Four times a day (QID) | OROMUCOSAL | Status: AC | PRN

## 2024-09-25 MED ORDER — FENTANYL 25 MCG/HR TD PT72: 1.0000 | MEDICATED_PATCH | TRANSDERMAL | Status: DC

## 2024-09-25 MED ORDER — ALPRAZOLAM 0.5 MG PO TABS: 0.5000 mg | ORAL_TABLET | Freq: Every day | ORAL | Status: DC | PRN

## 2024-09-25 MED ORDER — POLYETHYLENE GLYCOL 3350 17 G PO PACK: 17.0000 g | PACK | Freq: Two times a day (BID) | ORAL | Status: DC

## 2024-09-25 MED ORDER — NICOTINE 14 MG/24HR TD PT24: 14.0000 mg | MEDICATED_PATCH | Freq: Every day | TRANSDERMAL | Status: DC

## 2024-09-25 MED ORDER — HYDROCODONE-ACETAMINOPHEN 10-325 MG PO TABS: 1.0000 | ORAL_TABLET | Freq: Four times a day (QID) | ORAL | 0 refills | Status: DC | PRN

## 2024-09-25 MED ORDER — TRAZODONE HCL 100 MG PO TABS: 100.0000 mg | ORAL_TABLET | Freq: Every day | ORAL | Status: DC

## 2024-09-25 NOTE — Progress Notes (Signed)
 Occupational Therapy Treatment Patient Details Name: Angel Riddle MRN: [id number] DOB: [birth year] Today's Date: 09/25/2024   History of present illness Pt is a 68 y/o F presenting to ED with c/o worsening back and R leg pain. MD assessment includes right leg pain secondary to degenerative disc disease. PMH significant for chronic back pain, MDD, HTN, GAD, GERD.   OT comments  Upon entering the room, pt supine in bed and agreeable to OT intervention. Pt performing bed mobility without assistance. Pt stands from EOB with use of RW and drapes LB clothing over RW with cuing to do so in order to hold onto walker safely with B UEs. Pt ambulates to bathroom with supervision for toileting needs. Toilet transfer and LB clothing management with focus on sit >stand with supervision overall. Pt returning to sit on EOB to don UB clothing without assistance. Pt then stands at sink for grooming tasks at supervision level and returns to bed at end of session. Call bell and all needed items within reach.       If plan is discharge home, recommend the following:  Assistance with cooking/housework;Assist for transportation   Equipment Recommendations  BSC/3in1       Precautions / Restrictions Precautions Precautions: Fall Recall of Precautions/Restrictions: Intact Restrictions Weight Bearing Restrictions Per Provider Order: No       Mobility Bed Mobility Overal bed mobility: Needs Assistance Bed Mobility: Supine to Sit     Supine to sit: Supervision, HOB elevated          Transfers Overall transfer level: Needs assistance Equipment used: Rolling walker (2 wheels) Transfers: Sit to/from Stand Sit to Stand: Supervision                 Balance Overall balance assessment: Needs assistance Sitting-balance support: Feet supported Sitting balance-Leahy Scale: Normal     Standing balance support: Bilateral upper extremity supported, During functional activity, Reliant on assistive  device for balance Standing balance-Leahy Scale: Good                             ADL either performed or assessed with clinical judgement   ADL Overall ADL's : Needs assistance/impaired     Grooming: Standing;Supervision/safety           Upper Body Dressing : Sitting;Supervision/safety;Set up   Lower Body Dressing: Sit to/from stand;Supervision/safety;Set up   Toilet Transfer: Rolling walker (2 wheels);Regular Toilet;Supervision/safety   Toileting- Clothing Manipulation and Hygiene: Supervision/safety;Sit to/from stand              Extremity/Trunk Assessment Upper Extremity Assessment Upper Extremity Assessment: Overall WFL for tasks assessed            Vision Baseline Vision/History: 1 Wears glasses Patient Visual Report: No change from baseline           Communication Communication Communication: No apparent difficulties   Cognition Arousal: Alert Behavior During Therapy: WFL for tasks assessed/performed Cognition: No apparent impairments                               Following commands: Intact        Cueing   Cueing Techniques: Verbal cues, Tactile cues, Visual cues             Pertinent Vitals/ Pain       Pain Assessment Pain Assessment: No/denies pain  Frequency  Min 2X/week        Progress Toward Goals  OT Goals(current goals can now be found in the care plan section)  Progress towards OT goals: Progressing toward goals      AM-PAC OT 6 Clicks Daily Activity     Outcome Measure   Help from another person eating meals?: None Help from another person taking care of personal grooming?: None Help from another person toileting, which includes using toliet, bedpan, or urinal?: None Help from another person bathing (including washing, rinsing, drying)?: A Little Help from another person to put on and taking off regular upper body clothing?: None Help from another person to put on and taking off  regular lower body clothing?: A Little 6 Click Score: 22    End of Session Equipment Utilized During Treatment: Rolling walker (2 wheels)  OT Visit Diagnosis: Unsteadiness on feet (R26.81)   Activity Tolerance Patient tolerated treatment well   Patient Left in bed;with call bell/phone within reach;with family/visitor present   Nurse Communication Mobility status        Time: 8884-8864 OT Time Calculation (min): 20 min  Charges: OT General Charges $OT Visit: 1 Visit OT Treatments $Self Care/Home Management : 8-22 mins  Izetta Claude, MS, OTR/L , CBIS ascom [phone]  09/25/24, 12:04 PM

## 2024-09-25 NOTE — Discharge Summary (Signed)
 Physician Discharge Summary   Patient: Angel Riddle MRN: [id number]  DOB: [birth year]   Admit:     Date of Admission: 09/13/2024 Admitted from: home   Discharge: Date of discharge: 09/25/24 Disposition: Skilled nursing facility Condition at discharge: good  CODE STATUS: DNR     Discharge Physician: Laneta Blunt, DO [facility]     PCP: [name], MD  Recommendations for Outpatient Follow-up:  Follow up with PCP [name], MD in 1-2 weeks Follow up asap with Dr mavis re: pain management and follow up spinal procedure  Holding eliquis , follow w/ Dr Melanee hematology    Discharge Instructions     Diet - low sodium heart healthy   Complete by: As directed    Increase activity slowly   Complete by: As directed          Discharge Diagnoses: Principal Problem:   Right leg pain Active Problems:   Depression, major, recurrent, moderate (HCC)   HLD (hyperlipidemia)   Hypertension   GERD (gastroesophageal reflux disease)       Hospital course / significant events:   HPI: Angel Riddle is a 68 y.o. female with a PMH significant for chronic back pain secondary to disc herniation, MDD, hypertension, generalized anxiety disorder, GERD, PE in June 2025 on eliquis , presenting to the ED with worsening back pain and right leg pain. Last epidural injection about 3 weeks ago. This was second epidural.  Pt had back surgery in March 2025. First back surgery in 2018. Plan for further operative management in 12/2024. [name] is concerned about patient's mobility and her safety given she lives alone.  She does not report any injuries or falls. States has been getting worse over the last 2 months   10/09: to ED. Given she had a recent ED visit, [facility] was consulted for admission. No lab work or imaging done in the ED.  10/10: trial steroids, antiinflammatories, muscle relaxer, voltaren, lidocaine  topical, Lyrica . Qualifies for SNF  rehab 10/11-10/12: stable thru weekend. Increased meds see order hx 10/13: significant increases to pain management, still poorly controlled. IR consulted to evaluate for possible injection - awaiting injection Friday 10/17 following Eliquis  washout 10/14: pain some improvement. Pending SNF rehab auth  10/15-10/16: pt ambulating occasionally but still having significant pain which prohibits walking / working w/ PT.  10/17: injection today 10/18-10/19: pain improved, she is not needing po prn rx nearly as much. Planning for SNF rehab  10/20: approved to go to rehab tomorrow. Sent message to PCP, PM&R and neurosurgery re: consideration for continuation fentanyl  patches outpatient - PCP and PM&R deferred to neurosurgery      Consultants:  Palliative care - no formal consult Interventional Radiology    Procedures/Surgeries: 09/21/24  Technically successful injection consisting of a right L5 and right S1 nerve root blocks and transforaminal epidurals      ASSESSMENT & PLAN:   Back pain with radiculopathy and right leg pain secondary to degenerative disc disease with herniation MRI performed on 08/30 showed L5-S1 disc protrusion with impingement of L5 and descending S1 nerve roots, resultant severe right lateral recess stenosis and right worse than left L5 foraminal narrowing.   Has been tentatively scheduled outpatient for further surgical intervention which is delayed due to being on DOAC for PE S/p IR injection L5 S1 fentanyl  patch 25mcg q72h Hydrocodone -APAP 10-325 mg 2 tabs q4h prn severe pain - was needing 2 tabs 4-5 times per day prior to injection, now at about  1-2 tabs once per day. Encouraged to minimize use and consider pre-dose prior to activity she knows will exacerbate back/leg pain  Lyrica  200mg  BID Robaxin  500 mg TID Topical lidocaine  patches bowel regimen Steroid tapered OFF at this time   Chronic conditions: HTN: holding home meds (maxide 37.5-25 mg half tablet  daily) BP has been WNL HLD; continue home meds (zetia  every other day and Praluent  q2weeks) GERD: continue home meds (PPI) MDD: Continue home fluoxetine .  PE: eliquis  held. Last dose morning of 10/13 Anxiety: xanax  prn Insomnia: trazodone 100 mg at bedtime     overweight based on BMI: Body mass index is 28.98 kg/m.[name] Significantly low or high BMI is associated with higher medical risk.  Underweight - under 18  overweight - 25 to 29 obese - 30 or more Class 1 obesity: BMI of 30.0 to 34 Class 2 obesity: BMI of 35.0 to 39 Class 3 obesity: BMI of 40.0 to 49 Super Morbid Obesity: BMI 50-59 Super-super Morbid Obesity: BMI 60+ Healthy nutrition and physical activity advised as adjunct to other disease management and risk reduction treatments         Discharge Instructions  Allergies as of 09/25/2024       Reactions   Doxycycline Nausea And Vomiting, Nausea Only, Other (See Comments)   doxycycline   Gadolinium Nausea And Vomiting   Gadolinium Derivatives Nausea And Vomiting   Iodinated Contrast Media Nausea And Vomiting, Other (See Comments), Nausea Only   Eyes turned blood red During a CT scan of the kidneys During a CT scan of the kidneys    Eyes turned blood red  During a CT scan of the kidneys   Levofloxacin  Other (See Comments)   Muscle Pain Muscle pain    Muscle Pain   Silicone Other (See Comments)   Pulls skin; anything that does not pull is recommended Other reaction(s): Other (See Comments) Pulls skin; anything that does not pull is recommended    Pulls skin; anything that does not pull is recommended   Adhesive [tape] Other (See Comments)   Pulls skin, anything that does not pull is recommended         Medication List     PAUSE taking these medications    Eliquis  5 MG Tabs tablet Wait to take this until your doctor or other care provider tells you to start again. Generic drug: apixaban  Take 1 tablet (5 mg total) by mouth 2 (two) times  daily.       STOP taking these medications    cephALEXin  500 MG capsule Commonly known as: KEFLEX    cyclobenzaprine  10 MG tablet Commonly known as: FLEXERIL    HYDROcodone -acetaminophen  7.5-325 MG tablet Commonly known as: NORCO Replaced by: HYDROcodone -acetaminophen  10-325 MG tablet   predniSONE  10 MG (21) Tbpk tablet Commonly known as: STERAPRED UNI-PAK 21 TAB   triamterene -hydrochlorothiazide  37.5-25 MG tablet Commonly known as: MAXZIDE -25       TAKE these medications    albuterol  108 (90 Base) MCG/ACT inhaler Commonly known as: VENTOLIN  HFA Inhale 2 puffs into the lungs every 6 (six) hours as needed for wheezing or shortness of breath.   ALPRAZolam  0.5 MG tablet Commonly known as: XANAX  Take 1 tablet (0.5 mg total) by mouth daily as needed for anxiety.   ezetimibe  10 MG tablet Commonly known as: Zetia  Take one tablet Monday, Wednesday and Friday.   fentaNYL  25 MCG/HR Commonly known as: DURAGESIC  Place 1 patch onto the skin every 3 (three) days for 15 days.   FLUoxetine  40  MG capsule Commonly known as: PROZAC  Take 1 capsule (40 mg total) by mouth daily.   HYDROcodone -acetaminophen  10-325 MG tablet Commonly known as: NORCO Take 1-2 tablets by mouth every 6 (six) hours as needed for severe pain (pain score 7-10). Replaces: HYDROcodone -acetaminophen  7.5-325 MG tablet   lidocaine  5 % Commonly known as: LIDODERM  Place 2 patches onto the skin daily. Remove & Discard patch within 12 hours or as directed by MD Start taking on: September 26, 2024   magic mouthwash (nystatin , hydrocortisone , diphenhydrAMINE, lidocaine ) suspension Swish and spit 5 mLs 4 (four) times daily as needed for mouth pain.   melatonin 5 MG Tabs Take 1 tablet (5 mg total) by mouth at bedtime as needed.   methocarbamol  500 MG tablet Commonly known as: ROBAXIN  Take 1 tablet (500 mg total) by mouth 3 (three) times daily. What changed:  when to take this reasons to take this   nicotine   14 mg/24hr patch Commonly known as: NICODERM CQ  - dosed in mg/24 hours Place 1 patch (14 mg total) onto the skin daily. Start taking on: September 26, 2024   pantoprazole  40 MG tablet Commonly known as: PROTONIX  Take 1 tablet (40 mg total) by mouth daily.   polyethylene glycol 17 g packet Commonly known as: MIRALAX / GLYCOLAX Take 17 g by mouth 2 (two) times daily.   potassium chloride  10 MEQ tablet Commonly known as: KLOR-CON  Take 1 tablet (10 mEq total) by mouth daily.   Praluent  75 MG/ML Soaj Generic drug: Alirocumab  Inject 1 pen (75 mg) into the skin every 14 days.   pregabalin  200 MG capsule Commonly known as: LYRICA  Take 1 capsule (200 mg total) by mouth 2 (two) times daily. What changed:  medication strength how much to take when to take this   traZODone 100 MG tablet Commonly known as: DESYREL Take 1 tablet (100 mg total) by mouth at bedtime.         Contact information for after-discharge care     Destination     [facility] [place], [place]. [name]   Service: Skilled Nursing Contact information: [address] [place]  [address] [phone]                     Allergies  Allergen Reactions   Doxycycline Nausea And Vomiting, Nausea Only and Other (See Comments)    doxycycline   Gadolinium Nausea And Vomiting   Gadolinium Derivatives Nausea And Vomiting   Iodinated Contrast Media Nausea And Vomiting, Other (See Comments) and Nausea Only    Eyes turned blood red  During a CT scan of the kidneys  During a CT scan of the kidneys    Eyes turned blood red  During a CT scan of the kidneys   Levofloxacin  Other (See Comments)    Muscle Pain  Muscle pain    Muscle Pain   Silicone Other (See Comments)    Pulls skin; anything that does not pull is recommended  Other reaction(s): Other (See Comments) Pulls skin; anything that does not pull is recommended    Pulls skin; anything that does not pull is recommended   Adhesive  [Tape] Other (See Comments)    Pulls skin, anything that does not pull is recommended      Subjective: Pt feelin gwell this morning, tolerating diet, ambulating steadily with assistance, no CP/SOB, no LE edema, pain is controlled   Discharge Exam: BP 131/76 (BP Location: Left Arm)   Pulse 82   Temp 97.7 F (36.5  C)   Resp 18   Ht 5' 5 (1.651 m)   Wt 79 kg   SpO2 97%   BMI 28.98 kg/m  General: Pt is alert, awake, not in acute distress Cardiovascular: RRR, S1/S2 +, no rubs, no gallops Respiratory: CTA bilaterally, no wheezing, no rhonchi Abdominal: Soft, NT, ND, bowel sounds + Extremities: no edema, no cyanosis     The results of significant diagnostics from this hospitalization (including imaging, microbiology, ancillary and laboratory) are listed below for reference.     Microbiology: No results found for this or any previous visit (from the past 240 hours).   Labs: BNP (last 3 results) No results for input(s): BNP in the last 8760 hours. Basic Metabolic Panel: No results for input(s): NA, K, CL, CO2, GLUCOSE, BUN, CREATININE, CALCIUM, MG, PHOS in the last 168 hours. Liver Function Tests: No results for input(s): AST, ALT, ALKPHOS, BILITOT, PROT, ALBUMIN  in the last 168 hours. No results for input(s): LIPASE, AMYLASE in the last 168 hours. No results for input(s): AMMONIA in the last 168 hours. CBC: No results for input(s): WBC, NEUTROABS, HGB, HCT, MCV, PLT in the last 168 hours. Cardiac Enzymes: No results for input(s): CKTOTAL, CKMB, CKMBINDEX, TROPONINI in the last 168 hours. BNP: Invalid input(s): POCBNP CBG: No results for input(s): GLUCAP in the last 168 hours. D-Dimer No results for input(s): DDIMER in the last 72 hours. Hgb A1c No results for input(s): HGBA1C in the last 72 hours. Lipid Profile No results for input(s): CHOL, HDL, LDLCALC, TRIG, CHOLHDL, LDLDIRECT in the  last 72 hours. Thyroid  function studies No results for input(s): TSH, T4TOTAL, T3FREE, THYROIDAB in the last 72 hours.  Invalid input(s): FREET3 Anemia work up No results for input(s): VITAMINB12, FOLATE, FERRITIN, TIBC, IRON, RETICCTPCT in the last 72 hours. Urinalysis    Component Value Date/Time   COLORURINE YELLOW (A) 09/08/2024 1303   APPEARANCEUR CLOUDY (A) 09/08/2024 1303   APPEARANCEUR Clear 09/07/2024 1442   LABSPEC 1.013 09/08/2024 1303   LABSPEC 1.008 01/02/2012 1820   PHURINE 7.0 09/08/2024 1303   GLUCOSEU NEGATIVE 09/08/2024 1303   GLUCOSEU NEGATIVE 05/30/2024 1444   HGBUR NEGATIVE 09/08/2024 1303   BILIRUBINUR NEGATIVE 09/08/2024 1303   BILIRUBINUR Negative 09/07/2024 1442   BILIRUBINUR Negative 01/02/2012 1820   KETONESUR NEGATIVE 09/08/2024 1303   PROTEINUR NEGATIVE 09/08/2024 1303   UROBILINOGEN 0.2 05/30/2024 1444   NITRITE POSITIVE (A) 09/08/2024 1303   LEUKOCYTESUR MODERATE (A) 09/08/2024 1303   LEUKOCYTESUR Negative 01/02/2012 1820   Sepsis Labs No results for input(s): WBC in the last 168 hours.  Invalid input(s): PROCALCITONIN, LACTICIDVEN Microbiology No results found for this or any previous visit (from the past 240 hours). Imaging DG HIP UNILAT WITH PELVIS 2-3 VIEWS RIGHT Result Date: 09/13/2024 CLINICAL DATA:  Right leg pain. Pain radiates from hip into the foot. Injury EXAM: DG HIP (WITH OR WITHOUT PELVIS) 2-3V RIGHT COMPARISON:  05/22/2019 FINDINGS: The hip joint spaces are preserved. There is minor lateral acetabular spurring on the right. Femoral head is well seated. No fracture. No evidence of erosion, avascular necrosis, or focal bone abnormality. Multiple pelvic phleboliths. Lower lumbar fusion hardware is partially included. IMPRESSION: Minor right hip osteoarthritis. Electronically Signed   By: Andrea Gasman M.D.   On: 09/13/2024 14:33      Time coordinating discharge: over 30 minutes  SIGNED:  Jamarrion Budai DO [facility]

## 2024-09-25 NOTE — TOC Transition Note (Signed)
 Transition of Care [facility]) - Discharge Note   Patient Details  Name: Angel Riddle MRN: [id number] Date of Birth: March 11, 1956  [facility] CM/SW Contact:  Alvaro Louder, LCSW Phone Number: 09/25/2024, 4:28 PM   Clinical Narrative:   LCSWA received insurance approval for patient to admit to SNF [facility]. LCSWA confirmed with MD that patient is stable for discharge. LCSWA notified the patient and they are in agreement with discharge. LCSWA confirmed bed is available at SNF. Transport arranged with Daughter.  RM 607B Number to report : [phone]  [facility] signing off        Patient Goals and CMS Choice            Discharge Placement                       Discharge Plan and Services Additional resources added to the After Visit Summary for                                       Social Drivers of Health (SDOH) Interventions SDOH Screenings   Food Insecurity: No Food Insecurity (09/13/2024)  Housing: Low Risk  (09/13/2024)  Transportation Needs: No Transportation Needs (09/13/2024)  Utilities: Not At Risk (09/13/2024)  Alcohol Screen: Low Risk  (06/23/2023)  Depression (PHQ2-9): High Risk (09/07/2024)  Financial Resource Strain: Low Risk  (09/02/2024)   Received from [facility]  Physical Activity: Unknown (06/23/2023)  Social Connections: Moderately Integrated (09/13/2024)  Stress: No Stress Concern Present (06/23/2023)  Tobacco Use: High Risk (09/13/2024)     Readmission Risk Interventions     No data to display

## 2024-09-25 NOTE — TOC CM/SW Note (Signed)
 Transition of Care (TOC) CM/SW Note   To Whom It May Concern:   Please be advised that the above-named patient will require a short-term nursing home stay - anticipated 30 days or less for rehabilitation and strengthening.  The plan is for return home

## 2024-09-25 NOTE — Plan of Care (Signed)

## 2024-09-25 NOTE — Plan of Care (Signed)

## 2024-09-26 ENCOUNTER — Telehealth: Payer: Self-pay

## 2024-09-26 DIAGNOSIS — K2100 Gastro-esophageal reflux disease with esophagitis, without bleeding: Secondary | ICD-10-CM | POA: Diagnosis not present

## 2024-09-26 NOTE — Telephone Encounter (Signed)
 Copied from CRM [id number]. Topic: General - Call Back - No Documentation >> Sep 26, 2024  3:08 PM Angel Riddle wrote: Reason for CRM: Patient called in requesting to speak with Angel Riddle. Can be reached at [phone]  I spoke with patient who states she is calling to schedule a hospital follow-up appointment.  Patient already has a hospital follow-up appointment scheduled with Angel Riddle on 10/09/2024.

## 2024-09-26 NOTE — Telephone Encounter (Signed)
 This encounter was created in error - please disregard.

## 2024-09-26 NOTE — Telephone Encounter (Signed)
 Pt aware to keep HFU on 11/4 & to reach out to pain management for assistance with her pain.

## 2024-09-27 ENCOUNTER — Telehealth: Payer: Self-pay

## 2024-09-27 DIAGNOSIS — Z87440 Personal history of urinary (tract) infections: Secondary | ICD-10-CM | POA: Diagnosis not present

## 2024-09-27 DIAGNOSIS — Z9181 History of falling: Secondary | ICD-10-CM | POA: Diagnosis not present

## 2024-09-27 DIAGNOSIS — I700 Atherosclerosis of aorta: Secondary | ICD-10-CM | POA: Diagnosis not present

## 2024-09-27 DIAGNOSIS — Z860100 Personal history of colon polyps, unspecified: Secondary | ICD-10-CM | POA: Diagnosis not present

## 2024-09-27 DIAGNOSIS — K219 Gastro-esophageal reflux disease without esophagitis: Secondary | ICD-10-CM | POA: Diagnosis not present

## 2024-09-27 DIAGNOSIS — Z9089 Acquired absence of other organs: Secondary | ICD-10-CM | POA: Diagnosis not present

## 2024-09-27 DIAGNOSIS — Z85828 Personal history of other malignant neoplasm of skin: Secondary | ICD-10-CM | POA: Diagnosis not present

## 2024-09-27 DIAGNOSIS — Z602 Problems related to living alone: Secondary | ICD-10-CM | POA: Diagnosis not present

## 2024-09-27 DIAGNOSIS — Z5982 Transportation insecurity: Secondary | ICD-10-CM | POA: Diagnosis not present

## 2024-09-27 DIAGNOSIS — Z90710 Acquired absence of both cervix and uterus: Secondary | ICD-10-CM | POA: Diagnosis not present

## 2024-09-27 DIAGNOSIS — G8929 Other chronic pain: Secondary | ICD-10-CM | POA: Diagnosis not present

## 2024-09-27 DIAGNOSIS — E538 Deficiency of other specified B group vitamins: Secondary | ICD-10-CM | POA: Diagnosis not present

## 2024-09-27 DIAGNOSIS — M48061 Spinal stenosis, lumbar region without neurogenic claudication: Secondary | ICD-10-CM | POA: Diagnosis not present

## 2024-09-27 DIAGNOSIS — M51362 Other intervertebral disc degeneration, lumbar region with discogenic back pain and lower extremity pain: Secondary | ICD-10-CM | POA: Diagnosis not present

## 2024-09-27 DIAGNOSIS — F331 Major depressive disorder, recurrent, moderate: Secondary | ICD-10-CM | POA: Diagnosis not present

## 2024-09-27 DIAGNOSIS — Z9842 Cataract extraction status, left eye: Secondary | ICD-10-CM | POA: Diagnosis not present

## 2024-09-27 DIAGNOSIS — Z79891 Long term (current) use of opiate analgesic: Secondary | ICD-10-CM | POA: Diagnosis not present

## 2024-09-27 DIAGNOSIS — M5127 Other intervertebral disc displacement, lumbosacral region: Secondary | ICD-10-CM | POA: Diagnosis not present

## 2024-09-27 DIAGNOSIS — Z9841 Cataract extraction status, right eye: Secondary | ICD-10-CM | POA: Diagnosis not present

## 2024-09-27 DIAGNOSIS — Z9049 Acquired absence of other specified parts of digestive tract: Secondary | ICD-10-CM | POA: Diagnosis not present

## 2024-09-27 DIAGNOSIS — F411 Generalized anxiety disorder: Secondary | ICD-10-CM | POA: Diagnosis not present

## 2024-09-27 DIAGNOSIS — I10 Essential (primary) hypertension: Secondary | ICD-10-CM | POA: Diagnosis not present

## 2024-09-27 DIAGNOSIS — E7800 Pure hypercholesterolemia, unspecified: Secondary | ICD-10-CM | POA: Diagnosis not present

## 2024-09-27 NOTE — Telephone Encounter (Signed)
 Ok for PT?

## 2024-09-27 NOTE — Telephone Encounter (Signed)
 Copied from CRM [id number]. Topic: Clinical - Home Health Verbal Orders >> Sep 27, 2024  2:20 PM Dedra NOVAK wrote: Caller/Agency: Soni from [facility] Callback Number: [phone] Service Requested: Physical Therapy Frequency: 1x/wk for 6 wks Any new concerns about the patient? No

## 2024-09-27 NOTE — Telephone Encounter (Signed)
 Attempted to provide the OK for verbal order per Dr Glendia and the contact information is incorrect for [facility] with [facility].

## 2024-10-01 DIAGNOSIS — M5380 Other specified dorsopathies, site unspecified: Secondary | ICD-10-CM | POA: Diagnosis not present

## 2024-10-01 DIAGNOSIS — M48062 Spinal stenosis, lumbar region with neurogenic claudication: Secondary | ICD-10-CM | POA: Diagnosis not present

## 2024-10-01 DIAGNOSIS — M5416 Radiculopathy, lumbar region: Secondary | ICD-10-CM | POA: Diagnosis not present

## 2024-10-01 DIAGNOSIS — M5126 Other intervertebral disc displacement, lumbar region: Secondary | ICD-10-CM | POA: Diagnosis not present

## 2024-10-01 DIAGNOSIS — M62830 Muscle spasm of back: Secondary | ICD-10-CM | POA: Diagnosis not present

## 2024-10-08 ENCOUNTER — Ambulatory Visit: Admitting: Physician Assistant

## 2024-10-09 ENCOUNTER — Inpatient Hospital Stay (HOSPITAL_BASED_OUTPATIENT_CLINIC_OR_DEPARTMENT_OTHER): Admitting: Oncology

## 2024-10-09 ENCOUNTER — Ambulatory Visit (INDEPENDENT_AMBULATORY_CARE_PROVIDER_SITE_OTHER): Admitting: Internal Medicine

## 2024-10-09 ENCOUNTER — Telehealth: Payer: Self-pay

## 2024-10-09 ENCOUNTER — Encounter: Payer: Self-pay | Admitting: Internal Medicine

## 2024-10-09 ENCOUNTER — Inpatient Hospital Stay: Attending: Oncology

## 2024-10-09 ENCOUNTER — Encounter: Payer: Self-pay | Admitting: Oncology

## 2024-10-09 VITALS — BP 110/57 | HR 69 | Temp 97.0°F | Resp 20 | Ht 65.0 in | Wt 178.6 lb

## 2024-10-09 VITALS — BP 120/70 | HR 66 | Temp 97.8°F | Ht 65.0 in | Wt 178.2 lb

## 2024-10-09 DIAGNOSIS — I2699 Other pulmonary embolism without acute cor pulmonale: Secondary | ICD-10-CM

## 2024-10-09 DIAGNOSIS — Z87440 Personal history of urinary (tract) infections: Secondary | ICD-10-CM

## 2024-10-09 DIAGNOSIS — I10 Essential (primary) hypertension: Secondary | ICD-10-CM

## 2024-10-09 DIAGNOSIS — K219 Gastro-esophageal reflux disease without esophagitis: Secondary | ICD-10-CM | POA: Diagnosis not present

## 2024-10-09 DIAGNOSIS — M48062 Spinal stenosis, lumbar region with neurogenic claudication: Secondary | ICD-10-CM | POA: Diagnosis not present

## 2024-10-09 DIAGNOSIS — N390 Urinary tract infection, site not specified: Secondary | ICD-10-CM

## 2024-10-09 DIAGNOSIS — R739 Hyperglycemia, unspecified: Secondary | ICD-10-CM

## 2024-10-09 DIAGNOSIS — E785 Hyperlipidemia, unspecified: Secondary | ICD-10-CM

## 2024-10-09 DIAGNOSIS — F331 Major depressive disorder, recurrent, moderate: Secondary | ICD-10-CM

## 2024-10-09 DIAGNOSIS — Z86711 Personal history of pulmonary embolism: Secondary | ICD-10-CM | POA: Diagnosis not present

## 2024-10-09 DIAGNOSIS — F17210 Nicotine dependence, cigarettes, uncomplicated: Secondary | ICD-10-CM | POA: Diagnosis not present

## 2024-10-09 DIAGNOSIS — E7800 Pure hypercholesterolemia, unspecified: Secondary | ICD-10-CM | POA: Diagnosis not present

## 2024-10-09 LAB — D-DIMER, QUANTITATIVE: D-Dimer, Quant: 1.03 ug{FEU}/mL — ABNORMAL HIGH (ref 0.00–0.50)

## 2024-10-09 MED ORDER — EZETIMIBE 10 MG PO TABS: ORAL_TABLET | ORAL | 1 refills | Status: DC

## 2024-10-09 MED ORDER — PANTOPRAZOLE SODIUM 40 MG PO TBEC: 40.0000 mg | DELAYED_RELEASE_TABLET | Freq: Every day | ORAL | 3 refills | Status: AC

## 2024-10-09 MED ORDER — FLUOXETINE HCL 40 MG PO CAPS: ORAL_CAPSULE | ORAL | 1 refills | Status: DC

## 2024-10-09 MED ORDER — POTASSIUM CHLORIDE ER 10 MEQ PO TBCR: 10.0000 meq | EXTENDED_RELEASE_TABLET | Freq: Every day | ORAL | 1 refills | Status: DC

## 2024-10-09 NOTE — Telephone Encounter (Signed)
 Received paperwork from [facility]. Placed in folder for review

## 2024-10-09 NOTE — Progress Notes (Signed)
 Hematology/Oncology Consult note [facility]  [phone] [phone]  Patient Care Team: [name], MD as PCP - General (Internal Medicine) Perla Evalene PARAS, MD as PCP - Cardiology (Cardiology) [name], MD (Internal Medicine) Dessa Reyes ORN, MD (General Surgery) Melanee Annah BROCKS, MD as Consulting Physician (Oncology)   Name of the patient: Angel Riddle  [id number]  May 24, 1956   Date of visit: 10/09/24  Diagnosis-history of pulmonary embolism  Chief complaint/ Reason for visit-routine follow-up of history of pulmonary embolism  Heme/Onc history: Patient is a 68 year old female with a past medical history significant for GERD low back pain and history of depression among other medical problems patient underwent L1-L2 decompression and fusion surgery on 02/08/2024. 2 months after her surgery patient was noted to have increasing right-sided pleuritic chest pain and therefore underwent CT angio chest while she was on vacation in [place] . CTA showed acute segmental/subsegmental pulmonary emboli involving the right lower lobe and small clot burden. Subsequently she underwent bilateral lower extremity Doppler which did not show any evidence of DVT. She is presently on Eliquis . Patient has never had any history of DVT or PE before. She is otherwise active for her age. Denies any changes in her appetite or weight    Results of hypercoagulable workup in May 2025 including prothrombin gene mutation, factor V Leiden mutation negative.  Protein C protein S and Antithrombin levels unremarkable.  Testing for antiphospholipid antibody syndrome negative.    Interval history-patient had to go for epidural injection for her ongoing back painAnd following that she had significant back pain and came to the ER.  This was treated symptomatically and subsequently Eliquis  was held during that hospitalization.  Patient is presently off Eliquis  and  doing well overall  ECOG PS- 1   Review of systems- Review of Systems  Constitutional:  Negative for chills, fever, malaise/fatigue and weight loss.  HENT:  Negative for congestion, ear discharge and nosebleeds.   Eyes:  Negative for blurred vision.  Respiratory:  Negative for cough, hemoptysis, sputum production, shortness of breath and wheezing.   Cardiovascular:  Negative for chest pain, palpitations, orthopnea and claudication.  Gastrointestinal:  Negative for abdominal pain, blood in stool, constipation, diarrhea, heartburn, melena, nausea and vomiting.  Genitourinary:  Negative for dysuria, flank pain, frequency, hematuria and urgency.  Musculoskeletal:  Negative for back pain, joint pain and myalgias.  Skin:  Negative for rash.  Neurological:  Negative for dizziness, tingling, focal weakness, seizures, weakness and headaches.  Endo/Heme/Allergies:  Does not bruise/bleed easily.  Psychiatric/Behavioral:  Negative for depression and suicidal ideas. The patient does not have insomnia.       Allergies  Allergen Reactions   Doxycycline Nausea And Vomiting, Nausea Only and Other (See Comments)    doxycycline   Gadolinium Nausea And Vomiting   Gadolinium Derivatives Nausea And Vomiting   Iodinated Contrast Media Nausea And Vomiting, Other (See Comments) and Nausea Only    Eyes turned blood red  During a CT scan of the kidneys  During a CT scan of the kidneys    Eyes turned blood red  During a CT scan of the kidneys   Levofloxacin  Other (See Comments)    Muscle Pain  Muscle pain    Muscle Pain   Silicone Other (See Comments)    Pulls skin; anything that does not pull is recommended  Other reaction(s): Other (See Comments) Pulls skin; anything that does not pull is recommended    Pulls skin;  anything that does not pull is recommended   Adhesive [Tape] Other (See Comments)    Pulls skin, anything that does not pull is recommended      Past Medical History:   Diagnosis Date   Allergy    Anal fissure    Anxiety    a.) on BZO (alprazolam ) PRN   Aortic atherosclerosis    Arthritis    B12 deficiency    Basal cell carcinoma (BCC) of skin of nose    Chronic back pain    Degenerative disc disease, lumbar    Depression    DOE (dyspnea on exertion)    Dysrhythmia    Sinus Tachycardia - on Metoprolol    Female bladder prolapse    Gallstones    GERD (gastroesophageal reflux disease)    Headache    Hx of migraines, but none in a long time (as of 2025)   Heart murmur    Hx of colonic polyp    Hx: UTI (urinary tract infection)    Hypercholesterolemia    Hypertension    Long term current use of aspirin     Lumbar stenosis    Panic attacks    Wears partial dentures      Past Surgical History:  Procedure Laterality Date   ABDOMINAL HYSTERECTOMY  1992   partial   CARPOMETACARPAL (CMC) FUSION OF THUMB Left 05/25/2023   Procedure: SUSPENSION ARTHROPLASTY OF LEFT THUMB CMC JOINT;  Surgeon: Edie Norleen PARAS, MD;  Location: [facility];  Service: Orthopedics;  Laterality: Left;   CATARACT EXTRACTION Bilateral 7989,7988   CHOLECYSTECTOMY  04/19/2014   COLONOSCOPY  07/02/2011   DR. Byrnett   COLONOSCOPY WITH PROPOFOL  N/A 09/25/2015   Procedure: COLONOSCOPY WITH PROPOFOL ;  Surgeon: Rogelia Copping, MD;  Location: [facility];  Service: Endoscopy;  Laterality: N/A;   FOOT SURGERY Right 2009   IR TRANSFOR EPI L/S SNG W/FL/CT  09/21/2024   LUMBAR FUSION  2018   MOUTH SURGERY     dental implants, veneers   POLYPECTOMY  09/25/2015   Procedure: POLYPECTOMY;  Surgeon: Rogelia Copping, MD;  Location: [facility];  Service: Endoscopy;;   skin surgery Left 08/27/2013   Basal cell removal-left nostril   TONSILLECTOMY AND ADENOIDECTOMY  1969    Social History   Socioeconomic History   Marital status: Widowed    Spouse name: Not on file   Number of children: 2   Years of education: Not on file   Highest education level: Some college, no degree   Occupational History   Not on file  Tobacco Use   Smoking status: Every Day    Current packs/day: 1.00    Average packs/day: 1 pack/day for 48.0 years (48.0 ttl pk-yrs)    Types: Cigarettes    Start date: 2013    Last attempt to quit: 1975   Smokeless tobacco: Never   Tobacco comments:    Smokes 1 PPD  Vaping Use   Vaping status: Never Used  Substance and Sexual Activity   Alcohol use: Yes    Comment: Rarely   Drug use: No    Comment: Delta 9 Gummies - in the evening   Sexual activity: Not Currently    Birth control/protection: None  Other Topics Concern   Not on file  Social History Narrative   Lives alone   Social Drivers of Health   Financial Resource Strain: Low Risk  (09/02/2024)   Received from [facility]   Overall Financial Resource Strain (CARDIA)  Difficulty of Paying Living Expenses: Not very hard  Food Insecurity: No Food Insecurity (09/13/2024)   Hunger Vital Sign    Worried About Running Out of Food in the Last Year: Never true    Ran Out of Food in the Last Year: Never true  Transportation Needs: No Transportation Needs (09/13/2024)   PRAPARE - Administrator, Civil Service (Medical): No    Lack of Transportation (Non-Medical): No  Physical Activity: Unknown (06/23/2023)   Exercise Vital Sign    Days of Exercise per Week: 0 days    Minutes of Exercise per Session: Not on file  Stress: No Stress Concern Present (06/23/2023)   [employer] of Occupational Health - Occupational Stress Questionnaire    Feeling of Stress : Only a little  Social Connections: Moderately Integrated (09/13/2024)   Social Connection and Isolation Panel    Frequency of Communication with Friends and Family: More than three times a week    Frequency of Social Gatherings with Friends and Family: Three times a week    Attends Religious Services: More than 4 times per year    Active Member of Clubs or Organizations: No    Attends Tax Inspector Meetings: More than 4 times per year    Marital Status: Widowed  Intimate Partner Violence: Not At Risk (09/13/2024)   Humiliation, Afraid, Rape, and Kick questionnaire    Fear of Current or Ex-Partner: No    Emotionally Abused: No    Physically Abused: No    Sexually Abused: No    Family History  Problem Relation Age of Onset   Hyperlipidemia Mother    Hypertension Mother    Alcohol abuse Father    Hyperlipidemia Father    Heart disease Father    Diabetes Father    Hyperlipidemia Sister    Lung cancer Brother    Hyperlipidemia Brother    Breast cancer Neg Hx      Current Outpatient Medications:    albuterol  (VENTOLIN  HFA) 108 (90 Base) MCG/ACT inhaler, Inhale 2 puffs into the lungs every 6 (six) hours as needed for wheezing or shortness of breath., Disp: 18 g, Rfl: 0   Alirocumab  (PRALUENT ) 75 MG/ML SOAJ, Inject 1 pen (75 mg) into the skin every 14 days., Disp: 6 mL, Rfl: 3   ALPRAZolam  (XANAX ) 0.5 MG tablet, Take 1 tablet (0.5 mg total) by mouth 2 (two) times daily as needed for anxiety., Disp: 30 tablet, Rfl: 0   ezetimibe  (ZETIA ) 10 MG tablet, Take one tablet Monday, Wednesday and Friday., Disp: 39 tablet, Rfl: 1   fentaNYL  (DURAGESIC ) 25 MCG/HR, Place 1 patch onto the skin every 3 (three) days for 15 days., Disp: 5 patch, Rfl: 0   FLUoxetine  (PROZAC ) 40 MG capsule, Take 1 capsule (40 mg total) by mouth daily., Disp: 90 capsule, Rfl: 1   HYDROcodone -acetaminophen  (NORCO) 10-325 MG tablet, Take 1-2 tablets by mouth every 6 (six) hours as needed for severe pain (pain score 7-10)., Disp: 30 tablet, Rfl: 0   lidocaine  (LIDODERM ) 5 %, Place 2 patches onto the skin daily. Remove & Discard patch within 12 hours or as directed by MD, Disp: , Rfl:    magic mouthwash (nystatin , hydrocortisone , diphenhydrAMINE, lidocaine ) suspension, Swish and spit 5 mLs 4 (four) times daily as needed for mouth pain., Disp: , Rfl:    pantoprazole  (PROTONIX ) 40 MG tablet, Take 1 tablet (40 mg  total) by mouth daily., Disp: 90 tablet, Rfl: 3   potassium chloride  (KLOR-CON ) 10  MEQ tablet, Take 1 tablet (10 mEq total) by mouth daily., Disp: 90 tablet, Rfl: 1   pregabalin  (LYRICA ) 200 MG capsule, Take 1 capsule (200 mg total) by mouth 2 (two) times daily., Disp: 60 capsule, Rfl: 0   [Paused] apixaban  (ELIQUIS ) 5 MG TABS tablet, Take 1 tablet (5 mg total) by mouth 2 (two) times daily., Disp: 60 tablet, Rfl: 0  Physical exam:  Vitals:   10/09/24 1415  BP: (!) 110/57  Pulse: 69  Resp: 20  Temp: (!) 97 F (36.1 C)  TempSrc: Tympanic  SpO2: 98%  Weight: 178 lb 9.6 oz (81 kg)  Height: 5' 5 (1.651 m)   Physical Exam Cardiovascular:     Rate and Rhythm: Normal rate and regular rhythm.     Heart sounds: Normal heart sounds.  Pulmonary:     Effort: Pulmonary effort is normal.     Breath sounds: Normal breath sounds.  Skin:    General: Skin is warm and dry.  Neurological:     Mental Status: She is alert and oriented to person, place, and time.      I have personally reviewed labs listed below:    Latest Ref Rng & Units 09/14/2024    3:28 AM  CMP  Glucose 70 - 99 mg/dL 878   BUN 8 - 23 mg/dL 17   Creatinine 9.55 - 1.00 mg/dL 9.20   Sodium 864 - 854 mmol/L 138   Potassium 3.5 - 5.1 mmol/L 3.9   Chloride 98 - 111 mmol/L 102   CO2 22 - 32 mmol/L 25   Calcium 8.9 - 10.3 mg/dL 9.3       Latest Ref Rng & Units 09/14/2024    3:28 AM  CBC  WBC 4.0 - 10.5 K/uL 9.7   Hemoglobin 12.0 - 15.0 g/dL 86.7   Hematocrit 63.9 - 46.0 % 38.1   Platelets 150 - 400 K/uL 304    I have personally reviewed Radiology images listed below: No images are attached to the encounter.  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 She has a significant central to right subarticular disc protrusion with superior migration at L5-S1 affecting the right L5 and S1 nerve roots. She has had some relief  to prior selective nerve root blocks performed at an outside institution. She presents now for selective nerve root block and transforaminal steroid injection at L5 and S1. EXAM: TRANSFORAMINAL EPIDURAL/NERVE ROOT BLOCK TRANSFORAMINAL EPIDURAL/NERVE ROOT BLOCK PROCEDURE: The procedure, risks, benefits, and alternatives were explained to the patient. Questions regarding the procedure were encouraged and answered. The patient understands and consents to the procedure. A posterior oblique approach was taken to the intervertebral foramen on the right at L5 using a curved 22 gauge spinal needle. Injection of Isovue -M 200 outlined the right L5 nerve root and showed good epidural spread. No vascular opacification is seen. Sixtymg of Depo-Medrol mixed with 1 mL 1% lidocaine  were instilled. A posterior oblique approach was taken to the intervertebral foramen on the right at S1 using a curved 22 gauge spinal needle. Injection of Isovue -M 200 outlined the right S1 nerve root and showed good epidural spread. No vascular opacification is seen. Sixtymg of Depo-Medrol mixed with 1 mL 1% lidocaine  were instilled. The procedure was well-tolerated. COMPARISON:  None Available. IMPRESSION: Technically successful injection consisting of a right L5 nerve root block and transforaminal epidural. Technically successful injection consisting of a right S1 nerve root block  and transforaminal epidural. Electronically Signed   By: Wilkie Lent M.D.   On: 09/21/2024 16:52   DG HIP UNILAT WITH PELVIS 2-3 VIEWS RIGHT Result Date: 09/13/2024 CLINICAL DATA:  Right leg pain. Pain radiates from hip into the foot. Injury EXAM: DG HIP (WITH OR WITHOUT PELVIS) 2-3V RIGHT COMPARISON:  05/22/2019 FINDINGS: The hip joint spaces are preserved. There is minor lateral acetabular spurring on the right. Femoral head is well seated. No fracture. No evidence of erosion, avascular necrosis, or focal bone abnormality. Multiple pelvic phleboliths. Lower  lumbar fusion hardware is partially included. IMPRESSION: Minor right hip osteoarthritis. Electronically Signed   By: Andrea Gasman M.D.   On: 09/13/2024 14:33     Assessment and plan- Patient is a 68 y.o. female with history of pulmonary embolism in May 2025 currently off Eliquis  here for routine follow-up  Patient noted to have small-volume segmental subsegmental pulmonary emboli in the right lower lobe in May 2025 for which he took Eliquis  for 6 months and recently came off Eliquis .  This was likely unprovoked and hypercoagulable workup was otherwise negative.  Will continue to observe her off anticoagulation at this time.  If she were to develop any recurrent episodes of thromboembolism in the future she will need to go back on anticoagulation indefinitely.  Patient will continue to follow-up with her PCP and she can be referred to me in the future if questions or concerns arise   Visit Diagnosis 1. History of pulmonary embolism      Dr. Annah Skene, MD, MPH [facility] [phone] 10/09/2024 2:46 PM

## 2024-10-09 NOTE — Progress Notes (Signed)
 Subjective:    Patient ID: Angel Riddle, female    DOB: 07/06/1956, 68 y.o.   MRN: [id number]  Patient here for  Chief Complaint  Patient presents with   Hospitalization Follow-up    HPI Here for hospital follow up. Admitted 09/13/24 - 09/25/24 - after presenting with worsening back pain and right leg pain. S/p back surgery 02/2024. Admitted for pain control - recommended fentanyl  patch, vicodin, lyrica , robaxin  and topical lidocaine  patches. During her stay she received a right L5-S1 and right S1 transforaminal ESI. Had f/u with Dr Avanell 10/01/24 - recommended to continue lyrica , tylenol , norco and fentanyl  patch - chaning to 12mcg/h. F/u 2 weeks. She is accompanied by her sister. History obtained from both of them. She is off eliquis . Was stopped in the hospital and informed did not need to restart. She has a f/u with hematology today and plans to discuss. Still with pain, but is doing overall better. Her and her sister also had concern regarding recurring UTIs. Discussed. Request urology evaluation.    Past Medical History:  Diagnosis Date   Allergy    Anal fissure    Anxiety    a.) on BZO (alprazolam ) PRN   Aortic atherosclerosis    Arthritis    B12 deficiency    Basal cell carcinoma (BCC) of skin of nose    Chronic back pain    Degenerative disc disease, lumbar    Depression    DOE (dyspnea on exertion)    Dysrhythmia    Sinus Tachycardia - on Metoprolol    Female bladder prolapse    Gallstones    GERD (gastroesophageal reflux disease)    Headache    Hx of migraines, but none in a long time (as of 2025)   Heart murmur    Hx of colonic polyp    Hx: UTI (urinary tract infection)    Hypercholesterolemia    Hypertension    Long term current use of aspirin     Lumbar stenosis    Panic attacks    Wears partial dentures    Past Surgical History:  Procedure Laterality Date   ABDOMINAL HYSTERECTOMY  1992   partial   CARPOMETACARPAL (CMC) FUSION OF THUMB Left 05/25/2023    Procedure: SUSPENSION ARTHROPLASTY OF LEFT THUMB CMC JOINT;  Surgeon: Edie Norleen PARAS, MD;  Location: [facility];  Service: Orthopedics;  Laterality: Left;   CATARACT EXTRACTION Bilateral [id number]   CHOLECYSTECTOMY  04/19/2014   COLONOSCOPY  07/02/2011   DR. Byrnett   COLONOSCOPY WITH PROPOFOL  N/A 09/25/2015   Procedure: COLONOSCOPY WITH PROPOFOL ;  Surgeon: Rogelia Copping, MD;  Location: [facility];  Service: Endoscopy;  Laterality: N/A;   FOOT SURGERY Right 2009   IR TRANSFOR EPI L/S SNG W/FL/CT  09/21/2024   LUMBAR FUSION  2018   MOUTH SURGERY     dental implants, veneers   POLYPECTOMY  09/25/2015   Procedure: POLYPECTOMY;  Surgeon: Rogelia Copping, MD;  Location: [facility];  Service: Endoscopy;;   skin surgery Left 08/27/2013   Basal cell removal-left nostril   TONSILLECTOMY AND ADENOIDECTOMY  1969   Family History  Problem Relation Age of Onset   Hyperlipidemia Mother    Hypertension Mother    Alcohol abuse Father    Hyperlipidemia Father    Heart disease Father    Diabetes Father    Hyperlipidemia Sister    Lung cancer Brother    Hyperlipidemia Brother    Breast cancer Neg Hx    Social History  Socioeconomic History   Marital status: Widowed    Spouse name: Not on file   Number of children: 2   Years of education: Not on file   Highest education level: Some college, no degree  Occupational History   Not on file  Tobacco Use   Smoking status: Every Day    Current packs/day: 1.00    Average packs/day: 1 pack/day for 48.1 years (48.1 ttl pk-yrs)    Types: Cigarettes    Start date: 2013    Last attempt to quit: 1975   Smokeless tobacco: Never   Tobacco comments:    Smokes 1 PPD  Vaping Use   Vaping status: Never Used  Substance and Sexual Activity   Alcohol use: Yes    Comment: Rarely   Drug use: No    Comment: Delta 9 Gummies - in the evening   Sexual activity: Not Currently    Birth control/protection: None  Other Topics Concern   Not on  file  Social History Narrative   Lives alone   Social Drivers of Health   Financial Resource Strain: Low Risk  (09/02/2024)   Received from [facility]   Overall Financial Resource Strain (CARDIA)    Difficulty of Paying Living Expenses: Not very hard  Food Insecurity: No Food Insecurity (09/13/2024)   Hunger Vital Sign    Worried About Running Out of Food in the Last Year: Never true    Ran Out of Food in the Last Year: Never true  Transportation Needs: No Transportation Needs (09/13/2024)   PRAPARE - Administrator, Civil Service (Medical): No    Lack of Transportation (Non-Medical): No  Physical Activity: Unknown (06/23/2023)   Exercise Vital Sign    Days of Exercise per Week: 0 days    Minutes of Exercise per Session: Not on file  Stress: No Stress Concern Present (06/23/2023)   [employer] of Occupational Health - Occupational Stress Questionnaire    Feeling of Stress : Only a little  Social Connections: Moderately Integrated (09/13/2024)   Social Connection and Isolation Panel    Frequency of Communication with Friends and Family: More than three times a week    Frequency of Social Gatherings with Friends and Family: Three times a week    Attends Religious Services: More than 4 times per year    Active Member of Clubs or Organizations: No    Attends Banker Meetings: More than 4 times per year    Marital Status: Widowed     Review of Systems  Constitutional:  Negative for appetite change, fever and unexpected weight change.  HENT:  Negative for congestion and sinus pressure.   Respiratory:  Negative for cough, chest tightness and shortness of breath.   Cardiovascular:  Negative for chest pain, palpitations and leg swelling.  Gastrointestinal:  Negative for abdominal pain, diarrhea, nausea and vomiting.  Genitourinary:  Negative for difficulty urinating and dysuria.  Musculoskeletal:  Positive for back pain. Negative for  myalgias.  Skin:  Negative for color change and rash.  Neurological:  Negative for dizziness and headaches.  Psychiatric/Behavioral:  Negative for agitation and dysphoric mood.        Objective:     BP 120/70   Pulse 66   Temp 97.8 F (36.6 C) (Oral)   Ht 5' 5 (1.651 m)   Wt 178 lb 4 oz (80.9 kg)   SpO2 95%   BMI 29.66 kg/m  Wt Readings from Last 3 Encounters:  10/09/24 178 lb 9.6 oz (81 kg)  10/09/24 178 lb 4 oz (80.9 kg)  09/13/24 174 lb 2.6 oz (79 kg)    Physical Exam Vitals reviewed.  Constitutional:      General: She is not in acute distress.    Appearance: Normal appearance.  HENT:     Head: Normocephalic and atraumatic.     Right Ear: External ear normal.     Left Ear: External ear normal.     Mouth/Throat:     Pharynx: No oropharyngeal exudate or posterior oropharyngeal erythema.  Eyes:     General: No scleral icterus.       Right eye: No discharge.        Left eye: No discharge.     Conjunctiva/sclera: Conjunctivae normal.  Neck:     Thyroid : No thyromegaly.  Cardiovascular:     Rate and Rhythm: Normal rate and regular rhythm.  Pulmonary:     Effort: No respiratory distress.     Breath sounds: Normal breath sounds. No wheezing.  Abdominal:     General: Bowel sounds are normal.     Palpations: Abdomen is soft.     Tenderness: There is no abdominal tenderness.  Musculoskeletal:        General: No swelling or tenderness.     Cervical back: Neck supple. No tenderness.  Lymphadenopathy:     Cervical: No cervical adenopathy.  Skin:    Findings: No erythema or rash.  Neurological:     Mental Status: She is alert.  Psychiatric:        Mood and Affect: Mood normal.        Behavior: Behavior normal.         Outpatient Encounter Medications as of 10/09/2024  Medication Sig   albuterol  (VENTOLIN  HFA) 108 (90 Base) MCG/ACT inhaler Inhale 2 puffs into the lungs every 6 (six) hours as needed for wheezing or shortness of breath.   Alirocumab   (PRALUENT ) 75 MG/ML SOAJ Inject 1 pen (75 mg) into the skin every 14 days.   ALPRAZolam  (XANAX ) 0.5 MG tablet Take 1 tablet (0.5 mg total) by mouth 2 (two) times daily as needed for anxiety.   [Paused] apixaban  (ELIQUIS ) 5 MG TABS tablet Take 1 tablet (5 mg total) by mouth 2 (two) times daily.   [EXPIRED] fentaNYL  (DURAGESIC ) 25 MCG/HR Place 1 patch onto the skin every 3 (three) days for 15 days.   HYDROcodone -acetaminophen  (NORCO) 10-325 MG tablet Take 1-2 tablets by mouth every 6 (six) hours as needed for severe pain (pain score 7-10).   lidocaine  (LIDODERM ) 5 % Place 2 patches onto the skin daily. Remove & Discard patch within 12 hours or as directed by MD   magic mouthwash (nystatin , hydrocortisone , diphenhydrAMINE, lidocaine ) suspension Swish and spit 5 mLs 4 (four) times daily as needed for mouth pain.   pregabalin  (LYRICA ) 200 MG capsule Take 1 capsule (200 mg total) by mouth 2 (two) times daily.   ezetimibe  (ZETIA ) 10 MG tablet Take one tablet Monday, Wednesday and Friday.   FLUoxetine  (PROZAC ) 40 MG capsule Take 1 capsule (40 mg total) by mouth daily.   pantoprazole  (PROTONIX ) 40 MG tablet Take 1 tablet (40 mg total) by mouth daily.   potassium chloride  (KLOR-CON ) 10 MEQ tablet Take 1 tablet (10 mEq total) by mouth daily.   [DISCONTINUED] ezetimibe  (ZETIA ) 10 MG tablet Take one tablet Monday, Wednesday and Friday.   [DISCONTINUED] FLUoxetine  (PROZAC ) 40 MG capsule Take 1 capsule (40 mg total) by mouth daily.   [  DISCONTINUED] melatonin 5 MG TABS Take 1 tablet (5 mg total) by mouth at bedtime as needed. (Patient not taking: Reported on 10/09/2024)   [DISCONTINUED] methocarbamol  (ROBAXIN ) 500 MG tablet Take 1 tablet (500 mg total) by mouth 3 (three) times daily. (Patient not taking: Reported on 10/09/2024)   [DISCONTINUED] nicotine  (NICODERM CQ  - DOSED IN MG/24 HOURS) 14 mg/24hr patch Place 1 patch (14 mg total) onto the skin daily. (Patient not taking: Reported on 10/09/2024)   [DISCONTINUED]  pantoprazole  (PROTONIX ) 40 MG tablet Take 1 tablet (40 mg total) by mouth daily.   [DISCONTINUED] polyethylene glycol (MIRALAX / GLYCOLAX) 17 g packet Take 17 g by mouth 2 (two) times daily. (Patient not taking: Reported on 10/09/2024)   [DISCONTINUED] potassium chloride  (KLOR-CON ) 10 MEQ tablet Take 1 tablet (10 mEq total) by mouth daily.   [DISCONTINUED] traZODone (DESYREL) 100 MG tablet Take 1 tablet (100 mg total) by mouth at bedtime. (Patient not taking: Reported on 10/09/2024)   No facility-administered encounter medications on file as of 10/09/2024.     Lab Results  Component Value Date   WBC 9.7 09/14/2024   HGB 13.2 09/14/2024   HCT 38.1 09/14/2024   PLT 304 09/14/2024   GLUCOSE 121 (H) 09/14/2024   CHOL 248 (H) 09/06/2024   TRIG 156 (H) 09/06/2024   HDL 52 09/06/2024   LDLDIRECT 139.0 05/04/2024   LDLCALC 168 (H) 09/06/2024   ALT 11 09/08/2024   AST 25 09/08/2024   NA 138 09/14/2024   K 3.9 09/14/2024   CL 102 09/14/2024   CREATININE 0.79 09/14/2024   BUN 17 09/14/2024   CO2 25 09/14/2024   TSH 0.72 11/15/2023   INR 1.0 09/13/2024   HGBA1C 6.1 (H) 09/06/2024    DG HIP UNILAT WITH PELVIS 2-3 VIEWS RIGHT Result Date: 09/13/2024 CLINICAL DATA:  Right leg pain. Pain radiates from hip into the foot. Injury EXAM: DG HIP (WITH OR WITHOUT PELVIS) 2-3V RIGHT COMPARISON:  05/22/2019 FINDINGS: The hip joint spaces are preserved. There is minor lateral acetabular spurring on the right. Femoral head is well seated. No fracture. No evidence of erosion, avascular necrosis, or focal bone abnormality. Multiple pelvic phleboliths. Lower lumbar fusion hardware is partially included. IMPRESSION: Minor right hip osteoarthritis. Electronically Signed   By: Andrea Gasman M.D.   On: 09/13/2024 14:33       Assessment & Plan:  Lumbar stenosis with neurogenic claudication Assessment & Plan: Admitted 09/13/24 - 09/25/24 - after presenting with worsening back pain and right leg pain. S/p back  surgery 02/2024. Admitted for pain control - recommended fentanyl  patch, vicodin, lyrica , robaxin  and topical lidocaine  patches. During her stay she received a right L5-S1 and right S1 transforaminal ESI. Had f/u with Dr Avanell 10/01/24 - recommended to continue lyrica , tylenol , norco and fentanyl  patch - chaning to 12mcg/h. F/u 2 weeks. Overall pain has improved. Continue f/u with Dr Avanell.    Essential hypertension -     Potassium Chloride  ER; Take 1 tablet (10 mEq total) by mouth daily.  Dispense: 90 tablet; Refill: 1  Hypercholesterolemia -     Hepatic function panel; Future -     Basic metabolic panel with GFR; Future -     Lipid panel; Future -     TSH; Future  Hyperglycemia Assessment & Plan: Low carb diet and exercise. Follow met b and A1c.   Orders: -     Hemoglobin A1c; Future  Primary hypertension Assessment & Plan: Continue metoprolol . On triam/hydrochlorothiazide . Blood as outlined.  Follow pressures.  No changes in medication. Follow metabolic panel.    Hyperlipidemia, unspecified hyperlipidemia type Assessment & Plan: Continue praluent . Continue low cholesterol diet and exercise. Follow lipid panel.  Also taking zetia . Added recently. Follow.  Lab Results  Component Value Date   CHOL 248 (H) 09/06/2024   HDL 52 09/06/2024   LDLCALC 168 (H) 09/06/2024   LDLDIRECT 139.0 05/04/2024   TRIG 156 (H) 09/06/2024   CHOLHDL 4.8 (H) 09/06/2024      History of pulmonary embolus (PE) Assessment & Plan: Has been on eliquis . Seeing hematology. Off eliquis  now. Was taken off during recent hospitalization. Has appt with hematology today. Plans to discuss.    Gastroesophageal reflux disease, unspecified whether esophagitis present Assessment & Plan: No upper symptoms reported. Continue protonix .    Depression, major, recurrent, moderate (HCC) Assessment & Plan: Continue cymbalta  and prozac . Overall stable. Follow.    Frequent UTI Assessment & Plan: Pt and her  sister concerned regarding recurring UTIs. Discussed. Refer to urology for further evaluation.   Orders: -     Ambulatory referral to Urology  Other orders -     Ezetimibe ; Take one tablet Monday, Wednesday and Friday.  Dispense: 39 tablet; Refill: 1 -     FLUoxetine  HCl; Take 1 capsule (40 mg total) by mouth daily.  Dispense: 90 capsule; Refill: 1 -     Pantoprazole  Sodium; Take 1 tablet (40 mg total) by mouth daily.  Dispense: 90 tablet; Refill: 3     Allena Hamilton, MD

## 2024-10-09 NOTE — Progress Notes (Signed)
 Patient was recently in the hospital and had a nerve block done & took her off of her blood thinners.

## 2024-10-10 NOTE — Telephone Encounter (Signed)
 Faxed by Andrez.

## 2024-10-11 ENCOUNTER — Other Ambulatory Visit: Payer: Self-pay | Admitting: Internal Medicine

## 2024-10-11 ENCOUNTER — Other Ambulatory Visit: Payer: Self-pay | Admitting: Physician Assistant

## 2024-10-11 DIAGNOSIS — F331 Major depressive disorder, recurrent, moderate: Secondary | ICD-10-CM | POA: Diagnosis not present

## 2024-10-11 DIAGNOSIS — G8929 Other chronic pain: Secondary | ICD-10-CM | POA: Diagnosis not present

## 2024-10-11 DIAGNOSIS — I700 Atherosclerosis of aorta: Secondary | ICD-10-CM | POA: Diagnosis not present

## 2024-10-11 DIAGNOSIS — M48061 Spinal stenosis, lumbar region without neurogenic claudication: Secondary | ICD-10-CM | POA: Diagnosis not present

## 2024-10-11 DIAGNOSIS — M5127 Other intervertebral disc displacement, lumbosacral region: Secondary | ICD-10-CM | POA: Diagnosis not present

## 2024-10-11 DIAGNOSIS — E7800 Pure hypercholesterolemia, unspecified: Secondary | ICD-10-CM | POA: Diagnosis not present

## 2024-10-11 DIAGNOSIS — Z90710 Acquired absence of both cervix and uterus: Secondary | ICD-10-CM | POA: Diagnosis not present

## 2024-10-11 DIAGNOSIS — M51362 Other intervertebral disc degeneration, lumbar region with discogenic back pain and lower extremity pain: Secondary | ICD-10-CM | POA: Diagnosis not present

## 2024-10-11 DIAGNOSIS — E538 Deficiency of other specified B group vitamins: Secondary | ICD-10-CM | POA: Diagnosis not present

## 2024-10-11 DIAGNOSIS — I10 Essential (primary) hypertension: Secondary | ICD-10-CM | POA: Diagnosis not present

## 2024-10-11 DIAGNOSIS — K219 Gastro-esophageal reflux disease without esophagitis: Secondary | ICD-10-CM | POA: Diagnosis not present

## 2024-10-11 DIAGNOSIS — F411 Generalized anxiety disorder: Secondary | ICD-10-CM | POA: Diagnosis not present

## 2024-10-13 NOTE — Telephone Encounter (Signed)
 Received request for medication refill. Per review, she is off eliquis  and per oncology - will remain off. Also, per hospital discharge, she was supposed to stop triam/hydrochlorothiazide . This is no longer listed on her medication list. Please confirm that she is not taking.

## 2024-10-15 ENCOUNTER — Encounter: Payer: Self-pay | Admitting: Internal Medicine

## 2024-10-15 DIAGNOSIS — M5416 Radiculopathy, lumbar region: Secondary | ICD-10-CM | POA: Diagnosis not present

## 2024-10-15 DIAGNOSIS — M48062 Spinal stenosis, lumbar region with neurogenic claudication: Secondary | ICD-10-CM | POA: Diagnosis not present

## 2024-10-15 DIAGNOSIS — M5126 Other intervertebral disc displacement, lumbar region: Secondary | ICD-10-CM | POA: Diagnosis not present

## 2024-10-15 DIAGNOSIS — M62830 Muscle spasm of back: Secondary | ICD-10-CM | POA: Diagnosis not present

## 2024-10-15 DIAGNOSIS — M5380 Other specified dorsopathies, site unspecified: Secondary | ICD-10-CM | POA: Diagnosis not present

## 2024-10-15 DIAGNOSIS — N390 Urinary tract infection, site not specified: Secondary | ICD-10-CM | POA: Insufficient documentation

## 2024-10-15 NOTE — Assessment & Plan Note (Signed)
 Pt and her sister concerned regarding recurring UTIs. Discussed. Refer to urology for further evaluation.

## 2024-10-15 NOTE — Assessment & Plan Note (Signed)
 Has been on eliquis . Seeing hematology. Off eliquis  now. Was taken off during recent hospitalization. Has appt with hematology today. Plans to discuss.

## 2024-10-15 NOTE — Assessment & Plan Note (Signed)
 No upper symptoms reported.  Continue protonix.

## 2024-10-15 NOTE — Assessment & Plan Note (Signed)
 Continue metoprolol . On triam/hydrochlorothiazide . Blood as outlined. Follow pressures.  No changes in medication. Follow metabolic panel.

## 2024-10-15 NOTE — Telephone Encounter (Signed)
 Have her monitor her blood pressures and send in readings. Will adjust medication or add back if needed.  Remain off eliquis  per oncology.

## 2024-10-15 NOTE — Assessment & Plan Note (Signed)
 Admitted 09/13/24 - 09/25/24 - after presenting with worsening back pain and right leg pain. S/p back surgery 02/2024. Admitted for pain control - recommended fentanyl  patch, vicodin, lyrica , robaxin  and topical lidocaine  patches. During her stay she received a right L5-S1 and right S1 transforaminal ESI. Had f/u with Dr Avanell 10/01/24 - recommended to continue lyrica , tylenol , norco and fentanyl  patch - chaning to 12mcg/h. F/u 2 weeks. Overall pain has improved. Continue f/u with Dr Avanell.

## 2024-10-15 NOTE — Telephone Encounter (Signed)
 Spoke to pt she is not taking the Eliquis  but she was still taking the triam/hydrochlorothiazide  as of today but she will d/c it until we say otherwise

## 2024-10-15 NOTE — Assessment & Plan Note (Signed)
 Continue praluent . Continue low cholesterol diet and exercise. Follow lipid panel.  Also taking zetia . Added recently. Follow.  Lab Results  Component Value Date   CHOL 248 (H) 09/06/2024   HDL 52 09/06/2024   LDLCALC 168 (H) 09/06/2024   LDLDIRECT 139.0 05/04/2024   TRIG 156 (H) 09/06/2024   CHOLHDL 4.8 (H) 09/06/2024

## 2024-10-15 NOTE — Assessment & Plan Note (Signed)
 Low-carb diet and exercise.  Follow met b and A1c.

## 2024-10-15 NOTE — Assessment & Plan Note (Signed)
 Continue cymbalta  and prozac . Overall stable. Follow.

## 2024-10-16 ENCOUNTER — Ambulatory Visit: Attending: Physician Assistant | Admitting: Physician Assistant

## 2024-10-16 ENCOUNTER — Encounter: Payer: Self-pay | Admitting: Physician Assistant

## 2024-10-16 VITALS — BP 128/78 | HR 70 | Ht 65.0 in | Wt 179.2 lb

## 2024-10-16 DIAGNOSIS — I700 Atherosclerosis of aorta: Secondary | ICD-10-CM | POA: Diagnosis not present

## 2024-10-16 DIAGNOSIS — Z01810 Encounter for preprocedural cardiovascular examination: Secondary | ICD-10-CM | POA: Diagnosis not present

## 2024-10-16 DIAGNOSIS — R000 Tachycardia, unspecified: Secondary | ICD-10-CM

## 2024-10-16 DIAGNOSIS — Z86711 Personal history of pulmonary embolism: Secondary | ICD-10-CM | POA: Diagnosis not present

## 2024-10-16 DIAGNOSIS — E785 Hyperlipidemia, unspecified: Secondary | ICD-10-CM | POA: Diagnosis not present

## 2024-10-16 NOTE — Patient Instructions (Signed)
 Medication Instructions:  Your physician recommends that you continue on your current medications as directed. Please refer to the Current Medication list given to you today.   *If you need a refill on your cardiac medications before your next appointment, please call your pharmacy*  Lab Work: None ordered at this time   Follow-Up: At [facility], you and your health needs are our priority.  As part of our continuing mission to provide you with exceptional heart care, our providers are all part of one team.  This team includes your primary Cardiologist (physician) and Advanced Practice Providers or APPs (Physician Assistants and Nurse Practitioners) who all work together to provide you with the care you need, when you need it.  Your next appointment:   6 month(s)  Provider:   You may see Timothy Gollan, MD or Varney Gentleman, PA-C

## 2024-10-16 NOTE — Telephone Encounter (Signed)
Pt notified of recommendations via mychart.

## 2024-10-16 NOTE — Progress Notes (Signed)
 Cardiology Office Note    Date:  10/16/2024   ID:  Angel Riddle, DOB 1956/03/04, MRN [id number]  PCP:  [name], MD  Cardiologist:  Evalene Lunger, MD  Electrophysiologist:  None   Chief Complaint: Follow up  History of Present Illness:   Angel Riddle is a 68 y.o. female with history of aortic atherosclerosis, HTN, HLD, prior tobacco use, osteoarthritis, and lumbar radiculitis status post L1-L2 decompression and fusion surgery in 02/2024, pulmonary embolism in 04/2024 involving the right lower lobe with small clot burden who presents for follow-up of sinus tachycardia and HLD.  Prior documentation indicates remote cath in 2004 showing no significant CAD.  Chest CT in 03/2022 showed moderate aortic atherosclerosis without evidence of coronary artery calcification.   She was seen by Dr. Gollan in 04/2022 evaluation of tachypalpitations and chest discomfort.  She reported increased stress at home surrounding her husband's health.  It was noted EKG at outside office showed a sinus tachycardia with a rate of 111 bpm and vitals recording a rate of 120 bpm.  She was smoking less than 1/2 pack/day (had previously quit in 2012).  Sinus tachycardia was felt to be driven by stress/anxiety with recommendation to initiate Toprol -XL 25 mg.  Echo in 05/2022 showed an EF of 60 to 65%, no regional wall motion abnormalities, mild LVH, grade 1 diastolic dysfunction, normal RV systolic function, ventricular cavity size, and PASP, trivial mitral regurgitation, and an estimated right atrial pressure of 3 mmHg.  She was last seen in the office in 07/2023 was doing well from a cardiac perspective with recommendation to continue Lopressor .  She continued to smoke 1 pack daily.  Since she was last seen she underwent L1-L2 decompression and fusion surgery in 02/2024.  2 months after surgery she noted increasing right sided pleuritic chest pain with CTA of the chest showing acute segmental/subsegmental pulmonary  emboli involving the right lower lobe and small clot burden.  Bilateral lower extremity Doppler showed no evidence of DVT.  Hypercoagulable workup unrevealing.  She was placed on apixaban  for 6 months and was followed by hematology.  She comes in today and is without symptoms of angina or cardiac decompensation.  Functional status is largely limited by lumbar back pain.  She indicates she may be undergoing lumbar spine surgery before the end of the year.  No dizziness, presyncope, or syncope.  Reports a longstanding history of diplopia, previously vertical, though now sometimes horizontal diplopia.  Has appointment scheduled with neurology for later this month.  No symptoms of palpitations.  Prior carotid artery ultrasound showed no significant carotid artery stenosis.  Diplopia was even occurring while on anticoagulation, low suspicion for cardiac arrhythmia.  Adherent to to Praluent .  Tolerating recently started ezetimibe .  No lower extremity swelling or progressive orthopnea.   Labs independently reviewed: 09/2024 - potassium 3.9, BUN 17, serum creatinine 0.79, Hgb 13.2, PLT 304, albumin  3.4, AST/ALT normal, A1c 6.1, TC 248, TG 156, HDL 52, LDL 168 11/2023 - TSH normal  Past Medical History:  Diagnosis Date   Allergy    Anal fissure    Anxiety    a.) on BZO (alprazolam ) PRN   Aortic atherosclerosis    Arthritis    B12 deficiency    Basal cell carcinoma (BCC) of skin of nose    Chronic back pain    Degenerative disc disease, lumbar    Depression    DOE (dyspnea on exertion)    Dysrhythmia    Sinus Tachycardia -  on Metoprolol    Female bladder prolapse    Gallstones    GERD (gastroesophageal reflux disease)    Headache    Hx of migraines, but none in a long time (as of 2025)   Heart murmur    Hx of colonic polyp    Hx: UTI (urinary tract infection)    Hypercholesterolemia    Hypertension    Long term current use of aspirin     Lumbar stenosis    Panic attacks    Wears partial  dentures     Past Surgical History:  Procedure Laterality Date   ABDOMINAL HYSTERECTOMY  1992   partial   CARPOMETACARPAL (CMC) FUSION OF THUMB Left 05/25/2023   Procedure: SUSPENSION ARTHROPLASTY OF LEFT THUMB CMC JOINT;  Surgeon: Edie Norleen PARAS, MD;  Location: [facility];  Service: Orthopedics;  Laterality: Left;   CATARACT EXTRACTION Bilateral [id number]   CHOLECYSTECTOMY  04/19/2014   COLONOSCOPY  07/02/2011   DR. Byrnett   COLONOSCOPY WITH PROPOFOL  N/A 09/25/2015   Procedure: COLONOSCOPY WITH PROPOFOL ;  Surgeon: Rogelia Copping, MD;  Location: [facility];  Service: Endoscopy;  Laterality: N/A;   FOOT SURGERY Right 2009   IR TRANSFOR EPI L/S SNG W/FL/CT  09/21/2024   LUMBAR FUSION  2018   MOUTH SURGERY     dental implants, veneers   POLYPECTOMY  09/25/2015   Procedure: POLYPECTOMY;  Surgeon: Rogelia Copping, MD;  Location: [facility];  Service: Endoscopy;;   skin surgery Left 08/27/2013   Basal cell removal-left nostril   TONSILLECTOMY AND ADENOIDECTOMY  1969    Current Medications: Current Meds  Medication Sig   albuterol  (VENTOLIN  HFA) 108 (90 Base) MCG/ACT inhaler Inhale 2 puffs into the lungs every 6 (six) hours as needed for wheezing or shortness of breath.   Alirocumab  (PRALUENT ) 75 MG/ML SOAJ Inject 1 pen (75 mg) into the skin every 14 days.   ALPRAZolam  (XANAX ) 0.5 MG tablet Take 1 tablet (0.5 mg total) by mouth 2 (two) times daily as needed for anxiety.   ezetimibe  (ZETIA ) 10 MG tablet Take one tablet Monday, Wednesday and Friday.   FLUoxetine  (PROZAC ) 40 MG capsule Take 1 capsule (40 mg total) by mouth daily.   HYDROcodone -acetaminophen  (NORCO) 10-325 MG tablet Take 1-2 tablets by mouth every 6 (six) hours as needed for severe pain (pain score 7-10).   lidocaine  (LIDODERM ) 5 % Place 2 patches onto the skin daily. Remove & Discard patch within 12 hours or as directed by MD   magic mouthwash (nystatin , hydrocortisone , diphenhydrAMINE, lidocaine ) suspension Swish  and spit 5 mLs 4 (four) times daily as needed for mouth pain.   metoprolol  succinate (TOPROL -XL) 25 MG 24 hr tablet Take 1 tablet (25 mg total) by mouth daily. Take with or immediately following a meal.   pantoprazole  (PROTONIX ) 40 MG tablet Take 1 tablet (40 mg total) by mouth daily.   potassium chloride  (KLOR-CON ) 10 MEQ tablet Take 1 tablet (10 mEq total) by mouth daily.   pregabalin  (LYRICA ) 200 MG capsule Take 1 capsule (200 mg total) by mouth 2 (two) times daily.    Allergies:   Doxycycline, Gadolinium, Gadolinium derivatives, Iodinated contrast media, Levofloxacin , Silicone, and Adhesive [tape]   Social History   Socioeconomic History   Marital status: Widowed    Spouse name: Not on file   Number of children: 2   Years of education: Not on file   Highest education level: Some college, no degree  Occupational History   Not on file  Tobacco  Use   Smoking status: Every Day    Current packs/day: 1.00    Average packs/day: 1 pack/day for 48.1 years (48.1 ttl pk-yrs)    Types: Cigarettes    Start date: 2013    Last attempt to quit: 1975   Smokeless tobacco: Never   Tobacco comments:    Smokes 1 PPD  Vaping Use   Vaping status: Never Used  Substance and Sexual Activity   Alcohol use: Yes    Comment: Rarely   Drug use: No    Comment: Delta 9 Gummies - in the evening   Sexual activity: Not Currently    Birth control/protection: None  Other Topics Concern   Not on file  Social History Narrative   Lives alone   Social Drivers of Health   Financial Resource Strain: Low Risk  (09/02/2024)   Received from [facility]   Overall Financial Resource Strain (CARDIA)    Difficulty of Paying Living Expenses: Not very hard  Food Insecurity: No Food Insecurity (09/13/2024)   Hunger Vital Sign    Worried About Running Out of Food in the Last Year: Never true    Ran Out of Food in the Last Year: Never true  Transportation Needs: No Transportation Needs (09/13/2024)    PRAPARE - Administrator, Civil Service (Medical): No    Lack of Transportation (Non-Medical): No  Physical Activity: Unknown (06/23/2023)   Exercise Vital Sign    Days of Exercise per Week: 0 days    Minutes of Exercise per Session: Not on file  Stress: No Stress Concern Present (06/23/2023)   [employer] of Occupational Health - Occupational Stress Questionnaire    Feeling of Stress : Only a Riddle  Social Connections: Moderately Integrated (09/13/2024)   Social Connection and Isolation Panel    Frequency of Communication with Friends and Family: More than three times a week    Frequency of Social Gatherings with Friends and Family: Three times a week    Attends Religious Services: More than 4 times per year    Active Member of Clubs or Organizations: No    Attends Banker Meetings: More than 4 times per year    Marital Status: Widowed     Family History:  The patient's family history includes Alcohol abuse in her father; Diabetes in her father; Heart disease in her father; Hyperlipidemia in her brother, father, mother, and sister; Hypertension in her mother; Lung cancer in her brother. There is no history of Breast cancer.  ROS:   12-point review of systems is negative unless otherwise noted in the HPI.   EKGs/Labs/Other Studies Reviewed:    Studies reviewed were summarized above. The additional studies were reviewed today:  2D echo 05/13/2022: 1. Left ventricular ejection fraction, by estimation, is 60 to 65%. The  left ventricle has normal function. The left ventricle has no regional  wall motion abnormalities. There is mild left ventricular hypertrophy.  Left ventricular diastolic parameters  are consistent with Grade I diastolic dysfunction (impaired relaxation).  The average left ventricular global longitudinal strain is -16.2 %.   2. Right ventricular systolic function is normal. The right ventricular  size is normal. There is normal  pulmonary artery systolic pressure. The  estimated right ventricular systolic pressure is 30.0 mmHg.   3. The mitral valve is normal in structure. Trivial mitral valve  regurgitation. No evidence of mitral stenosis.   4. The aortic valve is normal in structure. Aortic valve regurgitation  is  not visualized. No aortic stenosis is present.   5. The inferior vena cava is normal in size with greater than 50%  respiratory variability, suggesting right atrial pressure of 3 mmHg.   Comparison(s): 04/2010-EF 60%.    EKG:  EKG is ordered today.  The EKG ordered today demonstrates NSR, 70 bpm, no acute st/t changes  Recent Labs: 11/15/2023: TSH 0.72 09/08/2024: ALT 11 09/14/2024: BUN 17; Creatinine, Ser 0.79; Hemoglobin 13.2; Platelets 304; Potassium 3.9; Sodium 138  Recent Lipid Panel    Component Value Date/Time   CHOL 248 (H) 09/06/2024 1344   TRIG 156 (H) 09/06/2024 1344   HDL 52 09/06/2024 1344   CHOLHDL 4.8 (H) 09/06/2024 1344   CHOLHDL 5 05/04/2024 1122   VLDL 83.6 (H) 05/04/2024 1122   LDLCALC 168 (H) 09/06/2024 1344   LDLDIRECT 139.0 05/04/2024 1122    PHYSICAL EXAM:    VS:  BP 128/78   Pulse 70   Ht 5' 5 (1.651 m)   Wt 179 lb 3.2 oz (81.3 kg)   SpO2 94%   BMI 29.82 kg/m   BMI: Body mass index is 29.82 kg/m.  Physical Exam Vitals reviewed.  Constitutional:      Appearance: She is well-developed.  HENT:     Head: Normocephalic and atraumatic.  Eyes:     General:        Right eye: No discharge.        Left eye: No discharge.  Cardiovascular:     Rate and Rhythm: Normal rate and regular rhythm.     Heart sounds: Normal heart sounds, S1 normal and S2 normal. Heart sounds not distant. No midsystolic click and no opening snap. No murmur heard.    No friction rub.  Pulmonary:     Effort: Pulmonary effort is normal. No respiratory distress.     Breath sounds: Normal breath sounds. No decreased breath sounds, wheezing, rhonchi or rales.  Musculoskeletal:     Cervical  back: Normal range of motion.     Right lower leg: No edema.     Left lower leg: No edema.  Skin:    General: Skin is warm and dry.     Nails: There is no clubbing.  Neurological:     Mental Status: She is alert and oriented to person, place, and time.  Psychiatric:        Speech: Speech normal.        Behavior: Behavior normal.        Thought Content: Thought content normal.        Judgment: Judgment normal.     Wt Readings from Last 3 Encounters:  10/16/24 179 lb 3.2 oz (81.3 kg)  10/09/24 178 lb 9.6 oz (81 kg)  10/09/24 178 lb 4 oz (80.9 kg)     ASSESSMENT & PLAN:   Sinus tachycardia: Quiescent on metoprolol .  Aortic atherosclerosis/HLD: LDL 168 in 09/2024 with normal AST/ALT at that time.  Reports that she had been adherent to Praluent  leading up to labs.  Now on ezetimibe  10 mg Monday, Wednesday, and Friday.  Suspect there is some familial hyperlipidemia component.  We did discuss referral to lipid clinic for further management with plans to make this referral after the first of the year when she is seen in follow-up.  HTN: Blood pressure is well controlled in the office today.  She remains on Toprol  XL 25 mg daily.  Preoperative cardiac risk stratification: May be needing to undergo lumbar surgery before the end  of the year.  Functional status is limited by lumbar back pain, though she is without symptoms of angina or cardiac decompensation.  Per RCRI, she is low risk for noncardiac surgery with an estimated rate of 0.9% for adverse cardiac event in the periprocedural timeframe.  She may proceed with noncardiac surgery without further cardiac testing.  History of pulmonary embolism: Followed by hematology.  Status post anticoagulation.     Disposition: F/u with Dr. Gollan or an APP in 6 months.   Medication Adjustments/Labs and Tests Ordered: Current medicines are reviewed at length with the patient today.  Concerns regarding medicines are outlined above. Medication  changes, Labs and Tests ordered today are summarized above and listed in the Patient Instructions accessible in Encounters.   Signed, Bernardino Bring, PA-C 10/16/2024 4:28 PM      [facility] [address] [place], [place] [address] [phone]

## 2024-10-24 NOTE — Telephone Encounter (Signed)
 open in error

## 2024-10-25 DIAGNOSIS — H532 Diplopia: Secondary | ICD-10-CM | POA: Diagnosis not present

## 2024-10-25 DIAGNOSIS — R4189 Other symptoms and signs involving cognitive functions and awareness: Secondary | ICD-10-CM | POA: Diagnosis not present

## 2024-10-25 DIAGNOSIS — E538 Deficiency of other specified B group vitamins: Secondary | ICD-10-CM | POA: Diagnosis not present

## 2024-10-25 DIAGNOSIS — Z8659 Personal history of other mental and behavioral disorders: Secondary | ICD-10-CM | POA: Diagnosis not present

## 2024-10-25 DIAGNOSIS — M47816 Spondylosis without myelopathy or radiculopathy, lumbar region: Secondary | ICD-10-CM | POA: Diagnosis not present

## 2024-10-26 ENCOUNTER — Ambulatory Visit (INDEPENDENT_AMBULATORY_CARE_PROVIDER_SITE_OTHER): Admitting: Urology

## 2024-10-26 VITALS — BP 127/78 | HR 81

## 2024-10-26 DIAGNOSIS — N390 Urinary tract infection, site not specified: Secondary | ICD-10-CM | POA: Diagnosis not present

## 2024-10-26 LAB — MICROSCOPIC EXAMINATION: Epithelial Cells (non renal): >10 /HPF — AB (ref 0–10)

## 2024-10-26 LAB — URINALYSIS, COMPLETE
Bilirubin, UA: NEGATIVE
Glucose, UA: NEGATIVE
Leukocytes,UA: NEGATIVE
Nitrite, UA: NEGATIVE
RBC, UA: NEGATIVE
Specific Gravity, UA: 1.025 (ref 1.005–1.030)
Urobilinogen, Ur: 0.2 mg/dL (ref 0.2–1.0)
pH, UA: 6 (ref 5.0–7.5)

## 2024-10-26 MED ORDER — ESTRADIOL 0.01 % VA CREA: TOPICAL_CREAM | VAGINAL | 12 refills | Status: AC

## 2024-10-26 NOTE — Progress Notes (Signed)
   10/26/2024 11:04 AM   Angel Riddle 1956/08/27 [id number]  Reason for visit: Follow up recurrent UTI, dysuria  History: Initial visit March 2023 for recurrent UTI, was started on topical estrogen cream, never followed up  Physical Exam: BP 127/78 (BP Location: Left Arm, Patient Position: Sitting, Cuff Size: Normal)   Pulse 81   SpO2 91%   Imaging/labs: Reviewed, E. coli positive cultures October 2025, November 2024, July 2024 Urinalysis contaminated with greater than 10 epithelial cells, 6-10 WBC, 0-2 RBC, many bacteria, negative leukocytes, negative nitrites  Today: She denies any symptoms today, primary complaint is foul-smelling urine and some occasional discomfort with urination No longer using topical estrogen cream, denies any gross hematuria  Plan:   Recurrent UTI: Recommended resuming the topical estrogen cream. We discussed the evaluation and treatment of patients with recurrent UTIs at length.  We specifically discussed the differences between asymptomatic bacteriuria and true urinary tract infection.  We discussed the AUA definition of recurrent UTI of at least 2 culture proven symptomatic acute cystitis episodes in a 59-month period, or 3 within a 1 year period.  We discussed the importance of culture directed antibiotic treatment, and antibiotic stewardship.  First-line therapy includes nitrofurantoin (5 days), Bactrim(3 days), or fosfomycin(3 g single dose).  Possible etiologies of recurrent infection include periurethral tissue atrophy in postmenopausal woman, constipation, sexual activity, incomplete emptying, anatomic abnormalities, and even genetic predisposition.  Finally, we discussed the role of perineal hygiene, timed voiding, adequate hydration, topical vaginal estrogen, cranberry prophylaxis, and low-dose antibiotic prophylaxis. Topical estrogen cream, cranberry tablet prophylaxis, RTC 3 to 4 months symptom check, consider cystoscopy if no improvement   [name], MD  [facility] [address] [place], [place] [address] [phone]

## 2024-10-26 NOTE — Patient Instructions (Addendum)
 Apply a pea-sized amount of topical estrogen to the urethra(tube you pee through) nightly for 5 consecutive nights.  Then spaced this to just 3 times per week.  This is the best treatment to help prevent infections, and help with any discomfort with urination.

## 2024-10-30 ENCOUNTER — Other Ambulatory Visit: Payer: Self-pay | Admitting: Neurology

## 2024-10-30 DIAGNOSIS — H532 Diplopia: Secondary | ICD-10-CM

## 2024-10-30 DIAGNOSIS — G43E09 Chronic migraine with aura, not intractable, without status migrainosus: Secondary | ICD-10-CM

## 2024-10-30 NOTE — Telephone Encounter (Signed)
 Attempted to contact patient regarding her lab results, no answer, voicemail not set up

## 2024-11-05 ENCOUNTER — Telehealth: Payer: Self-pay

## 2024-11-05 NOTE — Telephone Encounter (Signed)
 Copied from CRM #[id number]. Topic: Appointments - Scheduling Inquiry for Clinic >> Nov 05, 2024  8:43 AM Angel Riddle wrote: Reason for CRM: Patient is following up with advise from her Neurologist regarding her B 12 injections. Prior to scheduling the patient wanted to know if she can be seen some time today  (around [place]) to get the injection administered.  She stated she is still having issues with her vision - Patient described her vision as:  - Double vision - Impaired - Criss cross - On top of each other  **PCP/PCP Team Please Advise**

## 2024-11-05 NOTE — Telephone Encounter (Signed)
 Ok to get B12 shots here - following orders per neurology -  Patient should get - Vit B12 1000 mcg IM once a week for 4 weeks then once a month for 4 months. During and after loading dose with injection treatment patient should also take 1000 mcg by mouth once  a day. Patient should get Vit B12 level done after 3 months.

## 2024-11-05 NOTE — Telephone Encounter (Signed)
 Called pt. Unable to leave vm due to vm not set up. Okay to relay and schedule opt for nurse visit

## 2024-11-07 ENCOUNTER — Ambulatory Visit

## 2024-11-07 DIAGNOSIS — E538 Deficiency of other specified B group vitamins: Secondary | ICD-10-CM | POA: Diagnosis not present

## 2024-11-07 MED ORDER — CYANOCOBALAMIN 1000 MCG/ML IJ SOLN
1000.0000 ug | Freq: Once | INTRAMUSCULAR | Status: AC
  Administered 2024-11-07: 1000 ug via INTRAMUSCULAR

## 2024-11-07 NOTE — Progress Notes (Signed)
 Pt received B12 injection in Left  deltoid muscle. Pt tolerated it well with no complaints or concerns.

## 2024-11-12 ENCOUNTER — Encounter: Payer: Self-pay | Admitting: Pharmacist

## 2024-11-12 NOTE — Progress Notes (Signed)
 Chart Review Reason: Medication access - [place] Expired  Summary: [employer] - Re-enrollment   Medication(s): All cholesterol medications (Prescribed Praluent ) Confirmed pharmacy refill hx, patient is still taking Praluent .    Currently Enrolled: Expired 08/26/2024   Application Status:  Approved for re-enrollment    [employer] ID: [id number] Fund: Hypercholesterolemia - Medicare Access Assistance Type: Co-pay Start Date: 10/13/2024 End Date: 10/12/2025               Rx Card: Card No.  [id number] RX BIN:  [id number] PCN:  [id number] Group:  [id number]     Manuelita FABIENE Kobs, PharmD Clinical Pharmacist [facility] [phone]

## 2024-11-14 ENCOUNTER — Ambulatory Visit

## 2024-11-14 DIAGNOSIS — E538 Deficiency of other specified B group vitamins: Secondary | ICD-10-CM

## 2024-11-14 MED ORDER — CYANOCOBALAMIN 1000 MCG/ML IJ SOLN
1000.0000 ug | Freq: Once | INTRAMUSCULAR | Status: AC
  Administered 2024-11-14: 1000 ug via INTRAMUSCULAR

## 2024-11-14 NOTE — Progress Notes (Signed)
 Pt presented for their vitamin B12 injection. Pt was identified through two identifiers. Pt tolerated shot well in their right deltoid.

## 2024-11-16 DIAGNOSIS — M4807 Spinal stenosis, lumbosacral region: Secondary | ICD-10-CM | POA: Diagnosis not present

## 2024-11-16 DIAGNOSIS — M5127 Other intervertebral disc displacement, lumbosacral region: Secondary | ICD-10-CM | POA: Diagnosis not present

## 2024-11-17 ENCOUNTER — Other Ambulatory Visit: Payer: Self-pay | Admitting: Physician Assistant

## 2024-11-19 ENCOUNTER — Other Ambulatory Visit: Payer: Self-pay | Admitting: Neurosurgery

## 2024-11-20 ENCOUNTER — Other Ambulatory Visit: Payer: Self-pay

## 2024-11-21 ENCOUNTER — Ambulatory Visit

## 2024-11-21 DIAGNOSIS — E538 Deficiency of other specified B group vitamins: Secondary | ICD-10-CM

## 2024-11-21 MED ORDER — CYANOCOBALAMIN 1000 MCG/ML IJ SOLN
1000.0000 ug | Freq: Once | INTRAMUSCULAR | Status: AC
  Administered 2024-11-21: 15:00:00 1000 ug via INTRAMUSCULAR

## 2024-11-21 NOTE — Progress Notes (Cosign Needed)
Pt received B12 injection in left deltoid muscle. Pt tolerated it well with no complaints or concerns.  

## 2024-11-28 ENCOUNTER — Ambulatory Visit

## 2024-11-30 ENCOUNTER — Other Ambulatory Visit

## 2024-12-07 ENCOUNTER — Ambulatory Visit: Payer: Self-pay

## 2024-12-07 NOTE — Telephone Encounter (Signed)
 FYI Only or Action Required?: FYI only for provider: Home Care.  Patient was last seen in primary care on 10/09/2024 by [name], MD.  Called Nurse Triage reporting Sinusitis.  Symptoms began several days ago.  Interventions attempted: Nothing.  Symptoms are: unchanged.  Triage Disposition: Home Care  Patient/caregiver understands and will follow disposition?: Yes  Reason for Disposition  [1] Sinus congestion as part of a cold AND [2] present < 10 days  Answer Assessment - Initial Assessment Questions Pt reports sinus congestion x 3 days. Denies fever. Pt requested home care recommendations. Provided and recommended UC if symptoms fail to improve, worsen or if new symptoms develop.  1. LOCATION: Where does it hurt?      Denies 2. ONSET: When did the sinus pain start?  (e.g., hours, days)      3 days 3. SEVERITY: How bad is the pain?   (Scale 0-10; or none, mild, moderate or severe)     States not painful, just won't clear via her nose 4. RECURRENT SYMPTOM: Have you ever had sinus problems before? If Yes, ask: When was the last time? and What happened that time?      States hs of sinus infections and doesn't believe this is one 5. NASAL CONGESTION: Is the nose blocked?     Yes  Protocols used: Sinus Pain or Congestion-A-AH  Past Medical History:  Diagnosis Date   Allergy    Anal fissure    Anxiety    a.) on BZO (alprazolam ) PRN   Aortic atherosclerosis    Arthritis    B12 deficiency    Basal cell carcinoma (BCC) of skin of nose    Chronic back pain    Degenerative disc disease, lumbar    Depression    DOE (dyspnea on exertion)    Dysrhythmia    Sinus Tachycardia - on Metoprolol    Female bladder prolapse    Gallstones    GERD (gastroesophageal reflux disease)    Headache    Hx of migraines, but none in a long time (as of 2025)   Heart murmur    Hx of colonic polyp    Hx: UTI (urinary tract infection)    Hypercholesterolemia    Hypertension     Long term current use of aspirin     Lumbar stenosis    Panic attacks    Wears partial dentures

## 2024-12-07 NOTE — Pre-Procedure Instructions (Signed)
 Surgical Instructions   Your procedure is scheduled on Thursday, January 8th. Report to [facility] Main Entrance A at 05:30 A.M., then check in with the Admitting office. Any questions or running late day of surgery: call [phone]  Questions prior to your surgery date: call [phone], Monday-Friday, 8am-4pm. If you experience any cold or flu symptoms such as cough, fever, chills, shortness of breath, etc. between now and your scheduled surgery, please notify us  at the above number.     Remember:  Do not eat or drink after midnight the night before your surgery    Take these medicines the morning of surgery with A SIP OF WATER   FLUoxetine  (PROZAC )  metoprolol  succinate (TOPROL -XL)  pantoprazole  (PROTONIX )  pregabalin  (LYRICA )    May take these medicines IF NEEDED: albuterol  (VENTOLIN  HFA)- bring inhaler with you ALPRAZolam  (XANAX )  HYDROcodone -acetaminophen  (NORCO)     One week prior to surgery, STOP taking any Aspirin  (unless otherwise instructed by your surgeon) Aleve , Naproxen , Ibuprofen, Motrin, Advil, Goody's, BC's, all herbal medications, fish oil, and non-prescription vitamins.                     Do NOT Smoke (Tobacco/Vaping) for 24 hours prior to your procedure.  If you use a CPAP at night, you may bring your mask/headgear for your overnight stay.   You will be asked to remove any contacts, glasses, piercing's, hearing aid's, dentures/partials prior to surgery. Please bring cases for these items if needed.    Patients discharged the day of surgery will not be allowed to drive home, and someone needs to stay with them for 24 hours.  SURGICAL WAITING ROOM VISITATION Patients may have no more than 2 support people in the waiting area - these visitors may rotate.   Pre-op nurse will coordinate an appropriate time for 1 ADULT support person, who may not rotate, to accompany patient in pre-op.  Children under the age of 6 must have an adult with them who is not  the patient and must remain in the main waiting area with an adult.  If the patient needs to stay at the hospital during part of their recovery, the visitor guidelines for inpatient rooms apply.  Please refer to the [facility] website for the visitor guidelines for any additional information.   If you received a COVID test during your pre-op visit  it is requested that you wear a mask when out in public, stay away from anyone that may not be feeling well and notify your surgeon if you develop symptoms. If you have been in contact with anyone that has tested positive in the last 10 days please notify you surgeon.      Pre-operative 4 CHG Bathing Instructions   You can play a key role in reducing the risk of infection after surgery. Your skin needs to be as free of germs as possible. You can reduce the number of germs on your skin by washing with CHG (chlorhexidine  gluconate) soap before surgery. CHG is an antiseptic soap that kills germs and continues to kill germs even after washing.   DO NOT use if you have an allergy to chlorhexidine /CHG or antibacterial soaps. If your skin becomes reddened or irritated, stop using the CHG and notify one of our RNs at [phone].   Please shower with the CHG soap starting 4 days before surgery using the following schedule:     Please keep in mind the following:  DO NOT shave, including legs and underarms,  starting the day of your first shower.   You may shave your face at any point before/day of surgery.  Place clean sheets on your bed the day you start using CHG soap. Use a clean washcloth (not used since being washed) for each shower. DO NOT sleep with pets once you start using the CHG.   CHG Shower Instructions:  Wash your face and private area with normal soap. If you choose to wash your hair, wash first with your normal shampoo.  After you use shampoo/soap, rinse your hair and body thoroughly to remove shampoo/soap residue.  Turn the water   OFF and apply  bottle of CHG soap to a CLEAN washcloth.  Apply CHG soap ONLY FROM YOUR NECK DOWN TO YOUR TOES (washing for 3-5 minutes)  DO NOT use CHG soap on face, private areas, open wounds, or sores.  Pay special attention to the area where your surgery is being performed.  If you are having back surgery, having someone wash your back for you may be helpful. Wait 2 minutes after CHG soap is applied, then you may rinse off the CHG soap.  Pat dry with a clean towel  Put on clean clothes/pajamas   If you choose to wear lotion, please use ONLY the CHG-compatible lotions that are listed below.  Additional instructions for the day of surgery:  If you choose, you may shower the morning of surgery with an antibacterial soap.  DO NOT APPLY any lotions, deodorants, cologne, or perfumes.   Do not bring valuables to the hospital. [facility] is not responsible for any belongings/valuables. Do not wear nail polish, gel polish, artificial nails, or any other type of covering on natural nails (fingers and toes) Do not wear jewelry or makeup Put on clean/comfortable clothes.  Please brush your teeth.  Ask your nurse before applying any prescription medications to the skin.     CHG Compatible Lotions   Aveeno Moisturizing lotion  Cetaphil Moisturizing Cream  Cetaphil Moisturizing Lotion  Clairol Herbal Essence Moisturizing Lotion, Dry Skin  Clairol Herbal Essence Moisturizing Lotion, Extra Dry Skin  Clairol Herbal Essence Moisturizing Lotion, Normal Skin  Curel Age Defying Therapeutic Moisturizing Lotion with Alpha Hydroxy  Curel Extreme Care Body Lotion  Curel Soothing Hands Moisturizing Hand Lotion  Curel Therapeutic Moisturizing Cream, Fragrance-Free  Curel Therapeutic Moisturizing Lotion, Fragrance-Free  Curel Therapeutic Moisturizing Lotion, Original Formula  Eucerin Daily Replenishing Lotion  Eucerin Dry Skin Therapy Plus Alpha Hydroxy Crme  Eucerin Dry Skin Therapy Plus Alpha  Hydroxy Lotion  Eucerin Original Crme  Eucerin Original Lotion  Eucerin Plus Crme Eucerin Plus Lotion  Eucerin TriLipid Replenishing Lotion  Keri Anti-Bacterial Hand Lotion  Keri Deep Conditioning Original Lotion Dry Skin Formula Softly Scented  Keri Deep Conditioning Original Lotion, Fragrance Free Sensitive Skin Formula  Keri Lotion Fast Absorbing Fragrance Free Sensitive Skin Formula  Keri Lotion Fast Absorbing Softly Scented Dry Skin Formula  Keri Original Lotion  Keri Skin Renewal Lotion Keri Silky Smooth Lotion  Keri Silky Smooth Sensitive Skin Lotion  Nivea Body Creamy Conditioning Oil  Nivea Body Extra Enriched Lotion  Nivea Body Original Lotion  Nivea Body Sheer Moisturizing Lotion Nivea Crme  Nivea Skin Firming Lotion  NutraDerm 30 Skin Lotion  NutraDerm Skin Lotion  NutraDerm Therapeutic Skin Cream  NutraDerm Therapeutic Skin Lotion  ProShield Protective Hand Cream  Provon moisturizing lotion  Please read over the following fact sheets that you were given.

## 2024-12-07 NOTE — Telephone Encounter (Signed)
 Copied from CRM [id number]. Topic: Clinical - Medication Question >> Dec 07, 2024  9:43 AM Alfonso ORN wrote: Reason for CRM: pt exp sinus drainage for several days, no sore throat  She has surgery next Thursday . pt wants to know if she could be prescribed a steroid with surgery coming up  if so she will schedule OV to be seen for prescription.Please advise   Callback: [phone]

## 2024-12-09 NOTE — Telephone Encounter (Signed)
 Reviewed today due to out of office last week. Please call and confirm she is doing ok and feeling better. If persistent symptoms, will need to be evaluated.

## 2024-12-10 ENCOUNTER — Other Ambulatory Visit: Payer: Self-pay

## 2024-12-10 ENCOUNTER — Encounter (HOSPITAL_COMMUNITY)
Admission: RE | Admit: 2024-12-10 | Discharge: 2024-12-10 | Disposition: A | Discharge status: Home / Self Care | Source: Ambulatory Visit | Attending: Neurosurgery | Admitting: Neurosurgery

## 2024-12-10 ENCOUNTER — Encounter (HOSPITAL_COMMUNITY): Payer: Self-pay

## 2024-12-10 VITALS — BP 116/69 | HR 61 | Temp 98.7°F | Resp 18 | Ht 65.0 in | Wt 173.0 lb

## 2024-12-10 DIAGNOSIS — Z86711 Personal history of pulmonary embolism: Secondary | ICD-10-CM | POA: Insufficient documentation

## 2024-12-10 DIAGNOSIS — G8929 Other chronic pain: Secondary | ICD-10-CM | POA: Diagnosis not present

## 2024-12-10 DIAGNOSIS — M545 Low back pain, unspecified: Secondary | ICD-10-CM | POA: Insufficient documentation

## 2024-12-10 DIAGNOSIS — E7800 Pure hypercholesterolemia, unspecified: Secondary | ICD-10-CM | POA: Insufficient documentation

## 2024-12-10 DIAGNOSIS — R0609 Other forms of dyspnea: Secondary | ICD-10-CM | POA: Insufficient documentation

## 2024-12-10 DIAGNOSIS — K219 Gastro-esophageal reflux disease without esophagitis: Secondary | ICD-10-CM | POA: Insufficient documentation

## 2024-12-10 DIAGNOSIS — F418 Other specified anxiety disorders: Secondary | ICD-10-CM | POA: Insufficient documentation

## 2024-12-10 DIAGNOSIS — Z87891 Personal history of nicotine dependence: Secondary | ICD-10-CM | POA: Diagnosis not present

## 2024-12-10 DIAGNOSIS — I10 Essential (primary) hypertension: Secondary | ICD-10-CM | POA: Insufficient documentation

## 2024-12-10 DIAGNOSIS — I700 Atherosclerosis of aorta: Secondary | ICD-10-CM | POA: Diagnosis not present

## 2024-12-10 DIAGNOSIS — Z85828 Personal history of other malignant neoplasm of skin: Secondary | ICD-10-CM | POA: Insufficient documentation

## 2024-12-10 DIAGNOSIS — R011 Cardiac murmur, unspecified: Secondary | ICD-10-CM | POA: Diagnosis not present

## 2024-12-10 DIAGNOSIS — Z981 Arthrodesis status: Secondary | ICD-10-CM | POA: Diagnosis not present

## 2024-12-10 DIAGNOSIS — M4807 Spinal stenosis, lumbosacral region: Secondary | ICD-10-CM | POA: Insufficient documentation

## 2024-12-10 DIAGNOSIS — Z9049 Acquired absence of other specified parts of digestive tract: Secondary | ICD-10-CM | POA: Diagnosis not present

## 2024-12-10 DIAGNOSIS — Z96698 Presence of other orthopedic joint implants: Secondary | ICD-10-CM | POA: Insufficient documentation

## 2024-12-10 DIAGNOSIS — Z01818 Encounter for other preprocedural examination: Secondary | ICD-10-CM

## 2024-12-10 DIAGNOSIS — Z01812 Encounter for preprocedural laboratory examination: Secondary | ICD-10-CM | POA: Diagnosis present

## 2024-12-10 LAB — BASIC METABOLIC PANEL WITH GFR
Anion gap: 11 (ref 5–15)
BUN: 12 mg/dL (ref 8–23)
CO2: 26 mmol/L (ref 22–32)
Calcium: 9.5 mg/dL (ref 8.9–10.3)
Chloride: 102 mmol/L (ref 98–111)
Creatinine, Ser: 0.82 mg/dL (ref 0.44–1.00)
GFR, Estimated: >60 mL/min
Glucose, Bld: 95 mg/dL (ref 70–99)
Potassium: 4.1 mmol/L (ref 3.5–5.1)
Sodium: 139 mmol/L (ref 135–145)

## 2024-12-10 LAB — SURGICAL PCR SCREEN
MRSA, PCR: NEGATIVE
Staphylococcus aureus: NEGATIVE

## 2024-12-10 LAB — CBC
HCT: 40.4 % (ref 36.0–46.0)
Hemoglobin: 13.1 g/dL (ref 12.0–15.0)
MCH: 29.8 pg (ref 26.0–34.0)
MCHC: 32.4 g/dL (ref 30.0–36.0)
MCV: 91.8 fL (ref 80.0–100.0)
Platelets: 280 K/uL (ref 150–400)
RBC: 4.4 MIL/uL (ref 3.87–5.11)
RDW: 13.2 % (ref 11.5–15.5)
WBC: 9.5 K/uL (ref 4.0–10.5)
nRBC: 0 % (ref 0.0–0.2)

## 2024-12-10 LAB — TYPE AND SCREEN
ABO/RH(D): O NEG
Antibody Screen: NEGATIVE

## 2024-12-10 NOTE — Progress Notes (Signed)
 PCP - Dr. Allena Hamilton Cardiologist - Evalene Lunger, MD  LOV 10-16-24; clearance w/follow up in 6 months.    PPM/ICD - denies Device Orders - n/a Rep Notified - n/a  Chest x-ray - denies EKG - 10-16-24 Stress Test - denies ECHO - 05-13-22 Cardiac Cath - denies  Sleep Study - denies CPAP - n/a  NON-diabetic  Last dose of GLP1 agonist-  denies GLP1 instructions: n/a  Blood Thinner Instructions: denies Aspirin  Instructions: denies  ERAS Protcol - NPO PRE-SURGERY Ensure or G2- n/a  COVID TEST- n/a   Anesthesia review: CAD, HTN, cardiac clearance given on 10-16-24 by Dr. Bernardino Bring, PA-C  Patient denies shortness of breath, fever, cough and chest pain at PAT appointment   All instructions explained to the patient, with a verbal understanding of the material. Patient agrees to go over the instructions while at home for a better understanding. Patient also instructed to self quarantine after being tested for COVID-19. The opportunity to ask questions was provided.

## 2024-12-11 ENCOUNTER — Ambulatory Visit
Admission: RE | Admit: 2024-12-11 | Discharge: 2024-12-11 | Disposition: A | Source: Ambulatory Visit | Attending: Neurology | Admitting: Neurology

## 2024-12-11 ENCOUNTER — Encounter (HOSPITAL_COMMUNITY): Payer: Self-pay

## 2024-12-11 DIAGNOSIS — H532 Diplopia: Secondary | ICD-10-CM

## 2024-12-11 DIAGNOSIS — G43E09 Chronic migraine with aura, not intractable, without status migrainosus: Secondary | ICD-10-CM

## 2024-12-11 MED ORDER — GADOPICLENOL 0.5 MMOL/ML IV SOLN
10.0000 mL | Freq: Once | INTRAVENOUS | Status: AC | PRN
  Administered 2024-12-11: 8 mL via INTRAVENOUS

## 2024-12-11 NOTE — Progress Notes (Signed)
 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  Initial cardiology evaluation with Dr. Gollan on 04/13/2022 for tachycardia with occasional dyspnea. Reportedly prior cath in 2004 showed no significant CAD. LCS Chest CT in 03/2022 showed mild atherosclerosis involving the thoracic aorta. Echo in 05/2022 showed an EF of 60 to 65%, no regional wall motion abnormalities, mild LVH, grade 1 diastolic dysfunction, normal RV systolic function, ventricular cavity size, and PASP, trivial mitral regurgitation, and an estimated right atrial pressure of 3 mmHg.  Stress/anxiety felt to be contributing factor in tachycardia, Toprol  XL 25 mg initiated. Last cardiology follow-up was on 10/16/2024 with [name], PA-C. He noted previously post-operative PE, s/p apixaban  x 6 months. Reportedly hypercoagulable work-up was unrevealing. At visit, she denied angina. No symptoms of cardiac decompensation. She communicated plans for another back surgery and pending neurology recurrent episodes of diplopia for several years. Previously had unremarkable eye exam  and carotid US  in 2022 showed no significant ICA stenosis. In regards to surgery he wrote, Preoperative cardiac risk stratification: May be needing to undergo lumbar surgery before the end of the year. Functional status is limited by lumbar back pain, though she is without symptoms of angina or cardiac decompensation. Per RCRI, she is low risk for noncardiac surgery with an estimated rate of 0.9% for adverse cardiac event in the periprocedural timeframe. She may proceed with noncardiac surgery without further cardiac testing. Six month follow-up planned. Consider referral to [facility] at that time.  Neurology evaluation by Dr. Maree on 10/25/2024 for intermittent transient episodes of diplopia for 4-5 years. No associated headaches, photophobia, phonophobia, or nausea. History of cluster headaches. Consider migraine visual aura with diplopia. He had low suspicion for tumor or serous pathology, although brain MRI ordered--done 12/11/2024 with report pending. Myasthenia gravis labs were within normal limits. Advised starting magnesium .  Notes suggest sinus drainage for a few days in late December, with no sore throat. History of sinus infection but doesn't believe this is one. She notified her PCP office since she had surgery scheduled. They followed up with her on 12/09/2024 to ensure no ongoing or worsening issues that would warrant office evaluation--She reported the sinus congestion had cleared up. She denied fever, cough, SOB, chest pain at PAT RN visit.   Preoperative CBC and BMP within normal limits. Last EKG on 10/16/2024 showed NSR.  Anesthesia team to evaluate on the day of surgery.   VS: BP 116/69   Pulse 61   Temp 37.1 C   Resp 18   Ht 5' 5 (1.651 m)   Wt 78.5 kg   SpO2 96%   BMI 28.79 kg/m   PROVIDERS: [name], MD is PCP  [name], MD is cardiologist [name], MD is neurologist  [name], MD is urologist   LABS: Labs reviewed: Acceptable for  surgery. (all labs ordered are listed, but only abnormal results are displayed)  Labs Reviewed  SURGICAL PCR SCREEN  BASIC METABOLIC PANEL WITH GFR  CBC  TYPE AND SCREEN   10/25/2024 Myasthenia Gravis Profile results were within the negative range (see [facility]).    IMAGES: MRI Brain 12/11/2024: Report in process.  MRI L-spine 08/04/2024: IMPRESSION: 1. Broad-based central to right subarticular disc protrusion with superior migration at L5-S1, potentially affecting either the right L5 or descending S1 nerve roots. Reactive endplate change with marrow edema at this level could also contribute to right lower extremity symptoms. 2. Prior PLIF at L1-2 through L4-5 without significant residual or recurrent spinal stenosis. 3. Mild disc bulge with facet hypertrophy at L2-3 with resultant mild left lateral recess and left L2 foraminal stenosis.   CTA Chest 04/06/2024 ([facility]): IMPRESSION: - Acute segmental/subsegmental pulmonary emboli involving the right lower lobe. Overall small clot burden. No right heart strain by imaging.  - Trace right pleural effusion.    EKG: 10/16/2024: NSR   CV: BLE Venous US  04/13/2024:  IMPRESSION: Negative.  Echo 05/13/2022: IMPRESSIONS   1. Left ventricular ejection fraction, by estimation, is 60 to 65%. The  left ventricle has normal function. The left ventricle has no regional  wall motion abnormalities. There is mild left ventricular hypertrophy.  Left ventricular diastolic parameters  are consistent with Grade I diastolic dysfunction (impaired relaxation).  The average left ventricular global longitudinal strain is -16.2 %.   2. Right ventricular systolic function is normal. The right ventricular  size is normal. There is normal pulmonary artery systolic pressure. The  estimated right ventricular systolic pressure is 30.0 mmHg.   3. The mitral valve is normal in structure. Trivial mitral valve  regurgitation. No evidence of mitral stenosis.    4. The aortic valve is normal in structure. Aortic valve regurgitation is  not visualized. No aortic stenosis is present.   5. The inferior vena cava is normal in size with greater than 50%  respiratory variability, suggesting right atrial pressure of 3 mmHg.  - Comparison(s): 04/2010-EF 60%.   US  Carotid 04/01/2021: Summary:  - Right Carotid: There is no evidence of stenosis in the right ICA.  - Left Carotid: There is no evidence of stenosis in the left ICA.  - Vertebrals:  Bilateral vertebral arteries demonstrate antegrade flow.  - Subclavians: Normal flow hemodynamics were seen in bilateral subclavian arteries.    Past Medical History:  Diagnosis Date   Allergy    Anal fissure    Anxiety    a.) on BZO (alprazolam ) PRN   Aortic atherosclerosis    Arthritis    B12 deficiency    Basal cell carcinoma (BCC) of skin of nose    Chronic back pain    Degenerative disc disease, lumbar    Depression    DOE (dyspnea on exertion)    Dysrhythmia    Sinus Tachycardia - on Metoprolol    Female bladder prolapse    Gallstones    GERD (gastroesophageal reflux disease)    Headache    Hx of migraines, but none in a long time (as of 2025)   Heart murmur    Hx of colonic polyp    Hx: UTI (urinary tract infection)    Hypercholesterolemia    Hypertension    Long term current use of aspirin     Lumbar stenosis    Panic attacks  PE (pulmonary thromboembolism) (HCC) 04/06/2024   Wears partial dentures     Past Surgical History:  Procedure Laterality Date   ABDOMINAL HYSTERECTOMY  1992   partial   CARPOMETACARPAL (CMC) FUSION OF THUMB Left 05/25/2023   Procedure: SUSPENSION ARTHROPLASTY OF LEFT THUMB CMC JOINT;  Surgeon: Edie Norleen PARAS, MD;  Location: [facility] ORS;  Service: Orthopedics;  Laterality: Left;   CATARACT EXTRACTION Bilateral [id number]   CHOLECYSTECTOMY  04/19/2014   COLONOSCOPY  07/02/2011   DR. Byrnett   COLONOSCOPY WITH PROPOFOL  N/A 09/25/2015   Procedure: COLONOSCOPY WITH  PROPOFOL ;  Surgeon: Rogelia Copping, MD;  Location: [facility];  Service: Endoscopy;  Laterality: N/A;   FOOT SURGERY Right 2009   IR TRANSFOR EPI L/S SNG W/FL/CT  09/21/2024   LUMBAR FUSION  2018   MOUTH SURGERY     dental implants, veneers   POLYPECTOMY  09/25/2015   Procedure: POLYPECTOMY;  Surgeon: Rogelia Copping, MD;  Location: [facility];  Service: Endoscopy;;   skin surgery Left 08/27/2013   Basal cell removal-left nostril   TONSILLECTOMY AND ADENOIDECTOMY  1969    MEDICATIONS:  albuterol  (VENTOLIN  HFA) 108 (90 Base) MCG/ACT inhaler   Alirocumab  (PRALUENT ) 75 MG/ML SOAJ   ALPRAZolam  (XANAX ) 0.5 MG tablet   ascorbic acid (VITAMIN C) 500 MG tablet   Biotin 1 MG CAPS   Cholecalciferol (VITAMIN D -1000 MAX ST) 25 MCG (1000 UT) tablet   estradiol  (ESTRACE ) 0.01 % CREA vaginal cream   ezetimibe  (ZETIA ) 10 MG tablet   fentaNYL  (DURAGESIC ) 25 MCG/HR   FLUoxetine  (PROZAC ) 40 MG capsule   HYDROcodone -acetaminophen  (NORCO) 10-325 MG tablet   lidocaine  (LIDODERM ) 5 %   magic mouthwash (nystatin , hydrocortisone , diphenhydrAMINE , lidocaine ) suspension   metoprolol  succinate (TOPROL -XL) 25 MG 24 hr tablet   pantoprazole  (PROTONIX ) 40 MG tablet   potassium chloride  (KLOR-CON ) 10 MEQ tablet   pregabalin  (LYRICA ) 150 MG capsule   pregabalin  (LYRICA ) 200 MG capsule   No current facility-administered medications for this encounter.    Isaiah Ruder, PA-C Surgical Short Stay/Anesthesiology [facility] Phone [phone] [facility] Phone [phone] 12/11/2024 2:00 PM

## 2024-12-11 NOTE — Anesthesia Preprocedure Evaluation (Addendum)
"                                    Anesthesia Evaluation  Patient identified by MRN, date of birth, ID band Patient awake    Reviewed: Allergy & Precautions, NPO status , Patient's Chart, lab work & pertinent test results, reviewed documented beta blocker date and time   History of Anesthesia Complications Negative for: history of anesthetic complications  Airway Mallampati: III  TM Distance: >3 FB     Dental no notable dental hx.    Pulmonary neg COPD, Current Smoker and Patient abstained from smoking. Prior postop PE   breath sounds clear to auscultation       Cardiovascular Exercise Tolerance: Poor hypertension, + DOE  (-) CAD, (-) Past MI and (-) Cardiac Stents + dysrhythmias + Valvular Problems/Murmurs  Rhythm:Regular  IMPRESSIONS     1. Left ventricular ejection fraction, by estimation, is 60 to 65%. The  left ventricle has normal function. The left ventricle has no regional  wall motion abnormalities. There is mild left ventricular hypertrophy.  Left ventricular diastolic parameters  are consistent with Grade I diastolic dysfunction (impaired relaxation).  The average left ventricular global longitudinal strain is -16.2 %.   2. Right ventricular systolic function is normal. The right ventricular  size is normal. There is normal pulmonary artery systolic pressure. The  estimated right ventricular systolic pressure is 30.0 mmHg.   3. The mitral valve is normal in structure. Trivial mitral valve  regurgitation. No evidence of mitral stenosis.   4. The aortic valve is normal in structure. Aortic valve regurgitation is  not visualized. No aortic stenosis is present.   5. The inferior vena cava is normal in size with greater than 50%  respiratory variability, suggesting right atrial pressure of 3 mmHg.      Neuro/Psych  Headaches, neg Seizures PSYCHIATRIC DISORDERS Anxiety Depression       GI/Hepatic ,GERD  Medicated and Controlled,,(+) neg Cirrhosis         Endo/Other    Renal/GU Renal disease     Musculoskeletal  (+) Arthritis , Osteoarthritis,    Abdominal   Peds  Hematology   Anesthesia Other Findings   Reproductive/Obstetrics                              Anesthesia Physical Anesthesia Plan  ASA: 3  Anesthesia Plan: General   Post-op Pain Management:    Induction: Intravenous  PONV Risk Score and Plan: 2 and Ondansetron  and Dexamethasone   Airway Management Planned: Oral ETT and Video Laryngoscope Planned  Additional Equipment:   Intra-op Plan:   Post-operative Plan: Extubation in OR  Informed Consent: I have reviewed the patients History and Physical, chart, labs and discussed the procedure including the risks, benefits and alternatives for the proposed anesthesia with the patient or authorized representative who has indicated his/her understanding and acceptance.     Dental advisory given  Plan Discussed with: CRNA  Anesthesia Plan Comments: (PAT note written 12/11/2024 by Allison Zelenak, PA-C.  )         Anesthesia Quick Evaluation  "

## 2024-12-13 ENCOUNTER — Encounter (HOSPITAL_COMMUNITY): Payer: Self-pay | Admitting: Neurosurgery

## 2024-12-13 ENCOUNTER — Ambulatory Visit (HOSPITAL_COMMUNITY)
Admission: RE | Admit: 2024-12-13 | Discharge: 2024-12-14 | Disposition: A | Discharge status: Home / Self Care | Attending: Neurosurgery | Admitting: Neurosurgery

## 2024-12-13 ENCOUNTER — Ambulatory Visit (HOSPITAL_COMMUNITY): Admitting: Anesthesiology

## 2024-12-13 ENCOUNTER — Ambulatory Visit (HOSPITAL_COMMUNITY): Payer: Self-pay | Admitting: Vascular Surgery

## 2024-12-13 ENCOUNTER — Other Ambulatory Visit: Payer: Self-pay

## 2024-12-13 ENCOUNTER — Ambulatory Visit (HOSPITAL_COMMUNITY)

## 2024-12-13 ENCOUNTER — Ambulatory Visit (HOSPITAL_COMMUNITY)
Admission: RE | Disposition: A | Discharge status: Home / Self Care | Payer: Self-pay | Source: Home / Self Care | Attending: Neurosurgery

## 2024-12-13 DIAGNOSIS — M5117 Intervertebral disc disorders with radiculopathy, lumbosacral region: Secondary | ICD-10-CM | POA: Diagnosis not present

## 2024-12-13 DIAGNOSIS — I10 Essential (primary) hypertension: Secondary | ICD-10-CM | POA: Diagnosis not present

## 2024-12-13 DIAGNOSIS — Z86711 Personal history of pulmonary embolism: Secondary | ICD-10-CM | POA: Diagnosis not present

## 2024-12-13 DIAGNOSIS — M1990 Unspecified osteoarthritis, unspecified site: Secondary | ICD-10-CM | POA: Insufficient documentation

## 2024-12-13 DIAGNOSIS — F418 Other specified anxiety disorders: Secondary | ICD-10-CM

## 2024-12-13 DIAGNOSIS — M4807 Spinal stenosis, lumbosacral region: Secondary | ICD-10-CM | POA: Diagnosis not present

## 2024-12-13 DIAGNOSIS — F17210 Nicotine dependence, cigarettes, uncomplicated: Secondary | ICD-10-CM

## 2024-12-13 DIAGNOSIS — M533 Sacrococcygeal disorders, not elsewhere classified: Secondary | ICD-10-CM | POA: Insufficient documentation

## 2024-12-13 DIAGNOSIS — K219 Gastro-esophageal reflux disease without esophagitis: Secondary | ICD-10-CM | POA: Diagnosis not present

## 2024-12-13 SURGERY — POSTERIOR LUMBAR FUSION 1 LEVEL
Anesthesia: General | Site: Spine Lumbar

## 2024-12-13 MED ORDER — METHOCARBAMOL 1000 MG/10ML IJ SOLN
INTRAMUSCULAR | Status: DC | PRN
  Administered 2024-12-13: 500 mg via INTRAVENOUS

## 2024-12-13 MED ORDER — LIDOCAINE 2% (20 MG/ML) 5 ML SYRINGE
INTRAMUSCULAR | Status: AC
  Filled 2024-12-13: qty 5

## 2024-12-13 MED ORDER — KETOROLAC TROMETHAMINE 30 MG/ML IJ SOLN
INTRAMUSCULAR | Status: AC
  Filled 2024-12-13: qty 1

## 2024-12-13 MED ORDER — ACETAMINOPHEN 325 MG PO TABS: 650.0000 mg | ORAL_TABLET | ORAL | Status: DC | PRN

## 2024-12-13 MED ORDER — CHLORHEXIDINE GLUCONATE CLOTH 2 % EX PADS: 6.0000 | MEDICATED_PAD | Freq: Once | CUTANEOUS | Status: DC

## 2024-12-13 MED ORDER — KETAMINE HCL 50 MG/5ML IJ SOSY
PREFILLED_SYRINGE | INTRAMUSCULAR | Status: AC
  Filled 2024-12-13: qty 5

## 2024-12-13 MED ORDER — THROMBIN 5000 UNITS EX SOLR
OROMUCOSAL | Status: DC | PRN
  Administered 2024-12-13 (×2): 5 mL via TOPICAL

## 2024-12-13 MED ORDER — SODIUM CHLORIDE 0.9 % IV SOLN
250.0000 mL | INTRAVENOUS | Status: DC
  Administered 2024-12-13: 250 mL via INTRAVENOUS

## 2024-12-13 MED ORDER — ONDANSETRON HCL 4 MG/2ML IJ SOLN: 4.0000 mg | Freq: Once | INTRAMUSCULAR | Status: DC | PRN

## 2024-12-13 MED ORDER — ALBUMIN HUMAN 5 % IV SOLN: INTRAVENOUS | Status: DC | PRN

## 2024-12-13 MED ORDER — ACETAMINOPHEN 10 MG/ML IV SOLN: 1000.0000 mg | Freq: Once | INTRAVENOUS | Status: DC | PRN

## 2024-12-13 MED ORDER — BACITRACIN ZINC 500 UNIT/GM EX OINT
TOPICAL_OINTMENT | CUTANEOUS | Status: DC | PRN
  Administered 2024-12-13: 1 via TOPICAL

## 2024-12-13 MED ORDER — PHENYLEPHRINE HCL-NACL 20-0.9 MG/250ML-% IV SOLN
INTRAVENOUS | Status: DC | PRN
  Administered 2024-12-13: 25 ug/min via INTRAVENOUS

## 2024-12-13 MED ORDER — PHENOL 1.4 % MT LIQD: 1.0000 | OROMUCOSAL | Status: DC | PRN

## 2024-12-13 MED ORDER — ACETAMINOPHEN 10 MG/ML IV SOLN
INTRAVENOUS | Status: DC | PRN
  Administered 2024-12-13: 1000 mg via INTRAVENOUS

## 2024-12-13 MED ORDER — HEMOSTATIC AGENTS (NO CHARGE) OPTIME
TOPICAL | Status: DC | PRN
  Administered 2024-12-13: 1 via TOPICAL

## 2024-12-13 MED ORDER — DEXAMETHASONE SOD PHOSPHATE PF 10 MG/ML IJ SOLN
INTRAMUSCULAR | Status: AC
  Filled 2024-12-13: qty 1

## 2024-12-13 MED ORDER — METOPROLOL SUCCINATE ER 25 MG PO TB24: 25.0000 mg | ORAL_TABLET | Freq: Every day | ORAL | Status: DC

## 2024-12-13 MED ORDER — KETAMINE HCL 10 MG/ML IJ SOLN
INTRAMUSCULAR | Status: DC | PRN
  Administered 2024-12-13 (×2): 25 mg via INTRAVENOUS

## 2024-12-13 MED ORDER — PHENYLEPHRINE 80 MCG/ML (10ML) SYRINGE FOR IV PUSH (FOR BLOOD PRESSURE SUPPORT)
PREFILLED_SYRINGE | INTRAVENOUS | Status: AC
  Filled 2024-12-13: qty 10

## 2024-12-13 MED ORDER — FENTANYL CITRATE (PF) 250 MCG/5ML IJ SOLN
INTRAMUSCULAR | Status: AC
  Filled 2024-12-13: qty 5

## 2024-12-13 MED ORDER — ONDANSETRON HCL 4 MG/2ML IJ SOLN: 4.0000 mg | Freq: Four times a day (QID) | INTRAMUSCULAR | Status: DC | PRN

## 2024-12-13 MED ORDER — PROPOFOL 10 MG/ML IV BOLUS
INTRAVENOUS | Status: AC
  Filled 2024-12-13: qty 20

## 2024-12-13 MED ORDER — POTASSIUM CHLORIDE CRYS ER 10 MEQ PO TBCR
10.0000 meq | EXTENDED_RELEASE_TABLET | Freq: Every day | ORAL | Status: DC
  Administered 2024-12-13: 10 meq via ORAL
  Filled 2024-12-13 (×4): qty 1

## 2024-12-13 MED ORDER — OXYCODONE HCL 5 MG/5ML PO SOLN: 5.0000 mg | Freq: Once | ORAL | Status: DC | PRN

## 2024-12-13 MED ORDER — CHLORHEXIDINE GLUCONATE 0.12 % MT SOLN
15.0000 mL | Freq: Once | OROMUCOSAL | Status: AC
  Administered 2024-12-13: 15 mL via OROMUCOSAL
  Filled 2024-12-13: qty 15

## 2024-12-13 MED ORDER — BUPIVACAINE LIPOSOME 1.3 % IJ SUSP
INTRAMUSCULAR | Status: AC
  Filled 2024-12-13: qty 20

## 2024-12-13 MED ORDER — OXYCODONE HCL 5 MG PO TABS
5.0000 mg | ORAL_TABLET | ORAL | Status: DC | PRN
  Administered 2024-12-13: 5 mg via ORAL
  Filled 2024-12-13: qty 1

## 2024-12-13 MED ORDER — ALBUTEROL SULFATE (2.5 MG/3ML) 0.083% IN NEBU: 2.5000 mg | INHALATION_SOLUTION | RESPIRATORY_TRACT | Status: DC | PRN

## 2024-12-13 MED ORDER — ORAL CARE MOUTH RINSE: 15.0000 mL | Freq: Once | OROMUCOSAL | Status: AC

## 2024-12-13 MED ORDER — MORPHINE SULFATE (PF) 2 MG/ML IV SOLN: 2.0000 mg | INTRAVENOUS | Status: DC | PRN

## 2024-12-13 MED ORDER — BACITRACIN ZINC 500 UNIT/GM EX OINT
TOPICAL_OINTMENT | CUTANEOUS | Status: AC
  Filled 2024-12-13: qty 28.35

## 2024-12-13 MED ORDER — ALBUTEROL SULFATE HFA 108 (90 BASE) MCG/ACT IN AERS: 2.0000 | INHALATION_SPRAY | Freq: Four times a day (QID) | RESPIRATORY_TRACT | Status: DC | PRN

## 2024-12-13 MED ORDER — BUPIVACAINE-EPINEPHRINE (PF) 0.5% -1:200000 IJ SOLN
INTRAMUSCULAR | Status: AC
  Filled 2024-12-13: qty 30

## 2024-12-13 MED ORDER — ACETAMINOPHEN 10 MG/ML IV SOLN
INTRAVENOUS | Status: AC
  Filled 2024-12-13: qty 100

## 2024-12-13 MED ORDER — ROCURONIUM BROMIDE 10 MG/ML (PF) SYRINGE
PREFILLED_SYRINGE | INTRAVENOUS | Status: AC
  Filled 2024-12-13: qty 10

## 2024-12-13 MED ORDER — FENTANYL CITRATE (PF) 100 MCG/2ML IJ SOLN
INTRAMUSCULAR | Status: AC
  Filled 2024-12-13: qty 2

## 2024-12-13 MED ORDER — VASHE WOUND IRRIGATION OPTIME
TOPICAL | Status: DC | PRN
  Administered 2024-12-13: 34 [oz_av]

## 2024-12-13 MED ORDER — ACETAMINOPHEN 500 MG PO TABS
1000.0000 mg | ORAL_TABLET | Freq: Four times a day (QID) | ORAL | Status: DC
  Administered 2024-12-13 – 2024-12-14 (×3): 1000 mg via ORAL
  Filled 2024-12-13 (×3): qty 2

## 2024-12-13 MED ORDER — METHOCARBAMOL 1000 MG/10ML IJ SOLN
INTRAMUSCULAR | Status: AC
  Filled 2024-12-13: qty 10

## 2024-12-13 MED ORDER — 0.9 % SODIUM CHLORIDE (POUR BTL) OPTIME
TOPICAL | Status: DC | PRN
  Administered 2024-12-13: 1000 mL

## 2024-12-13 MED ORDER — ONDANSETRON HCL 4 MG/2ML IJ SOLN
INTRAMUSCULAR | Status: AC
  Filled 2024-12-13: qty 2

## 2024-12-13 MED ORDER — THROMBIN 5000 UNITS EX KIT
PACK | CUTANEOUS | Status: AC
  Filled 2024-12-13: qty 1

## 2024-12-13 MED ORDER — SODIUM CHLORIDE 0.9% FLUSH
3.0000 mL | Freq: Two times a day (BID) | INTRAVENOUS | Status: DC
  Administered 2024-12-13: 3 mL via INTRAVENOUS

## 2024-12-13 MED ORDER — BISACODYL 10 MG RE SUPP: 10.0000 mg | Freq: Every day | RECTAL | Status: DC | PRN

## 2024-12-13 MED ORDER — ZOLPIDEM TARTRATE 5 MG PO TABS: 5.0000 mg | ORAL_TABLET | Freq: Every evening | ORAL | Status: DC | PRN

## 2024-12-13 MED ORDER — MIDAZOLAM HCL 2 MG/2ML IJ SOLN
INTRAMUSCULAR | Status: AC
  Filled 2024-12-13: qty 2

## 2024-12-13 MED ORDER — PANTOPRAZOLE SODIUM 40 MG PO TBEC
40.0000 mg | DELAYED_RELEASE_TABLET | Freq: Every day | ORAL | Status: DC
  Filled 2024-12-13: qty 1

## 2024-12-13 MED ORDER — ACETAMINOPHEN 650 MG RE SUPP: 650.0000 mg | RECTAL | Status: DC | PRN

## 2024-12-13 MED ORDER — FENTANYL CITRATE (PF) 250 MCG/5ML IJ SOLN
INTRAMUSCULAR | Status: DC | PRN
  Administered 2024-12-13: 50 ug via INTRAVENOUS
  Administered 2024-12-13: 150 ug via INTRAVENOUS
  Administered 2024-12-13: 50 ug via INTRAVENOUS

## 2024-12-13 MED ORDER — SODIUM CHLORIDE 0.9% FLUSH: 3.0000 mL | INTRAVENOUS | Status: DC | PRN

## 2024-12-13 MED ORDER — FENTANYL CITRATE (PF) 100 MCG/2ML IJ SOLN
25.0000 ug | INTRAMUSCULAR | Status: DC | PRN
  Administered 2024-12-13 (×2): 25 ug via INTRAVENOUS

## 2024-12-13 MED ORDER — LACTATED RINGERS IV SOLN: INTRAVENOUS | Status: DC

## 2024-12-13 MED ORDER — PROPOFOL 10 MG/ML IV BOLUS
INTRAVENOUS | Status: DC | PRN
  Administered 2024-12-13: 80 mg via INTRAVENOUS

## 2024-12-13 MED ORDER — OXYCODONE HCL 5 MG PO TABS: 5.0000 mg | ORAL_TABLET | Freq: Once | ORAL | Status: DC | PRN

## 2024-12-13 MED ORDER — ROCURONIUM BROMIDE 10 MG/ML (PF) SYRINGE
PREFILLED_SYRINGE | INTRAVENOUS | Status: DC | PRN
  Administered 2024-12-13 (×2): 20 mg via INTRAVENOUS
  Administered 2024-12-13: 80 mg via INTRAVENOUS
  Administered 2024-12-13 (×2): 20 mg via INTRAVENOUS

## 2024-12-13 MED ORDER — ONDANSETRON HCL 4 MG/2ML IJ SOLN
INTRAMUSCULAR | Status: DC | PRN
  Administered 2024-12-13: 4 mg via INTRAVENOUS

## 2024-12-13 MED ORDER — CEFAZOLIN SODIUM-DEXTROSE 2-4 GM/100ML-% IV SOLN
2.0000 g | INTRAVENOUS | Status: AC
  Administered 2024-12-13 (×2): 2 g via INTRAVENOUS
  Filled 2024-12-13: qty 100

## 2024-12-13 MED ORDER — FLUOXETINE HCL 20 MG PO CAPS
40.0000 mg | ORAL_CAPSULE | Freq: Every day | ORAL | Status: DC
  Administered 2024-12-13: 40 mg via ORAL
  Filled 2024-12-13 (×2): qty 2

## 2024-12-13 MED ORDER — MIDAZOLAM HCL (PF) 2 MG/2ML IJ SOLN
INTRAMUSCULAR | Status: DC | PRN
  Administered 2024-12-13: 2 mg via INTRAVENOUS

## 2024-12-13 MED ORDER — CYCLOBENZAPRINE HCL 10 MG PO TABS
10.0000 mg | ORAL_TABLET | Freq: Three times a day (TID) | ORAL | Status: DC | PRN
  Administered 2024-12-13: 10 mg via ORAL
  Filled 2024-12-13: qty 1

## 2024-12-13 MED ORDER — PREGABALIN 75 MG PO CAPS
150.0000 mg | ORAL_CAPSULE | Freq: Two times a day (BID) | ORAL | Status: DC
  Administered 2024-12-13: 150 mg via ORAL
  Filled 2024-12-13: qty 2

## 2024-12-13 MED ORDER — ONDANSETRON HCL 4 MG PO TABS: 4.0000 mg | ORAL_TABLET | Freq: Four times a day (QID) | ORAL | Status: DC | PRN

## 2024-12-13 MED ORDER — SUGAMMADEX SODIUM 200 MG/2ML IV SOLN
INTRAVENOUS | Status: DC | PRN
  Administered 2024-12-13: 200 mg via INTRAVENOUS

## 2024-12-13 MED ORDER — KETOROLAC TROMETHAMINE 15 MG/ML IJ SOLN
INTRAMUSCULAR | Status: DC | PRN
  Administered 2024-12-13: 15 mg via INTRAVENOUS

## 2024-12-13 MED ORDER — BUPIVACAINE-EPINEPHRINE (PF) 0.5% -1:200000 IJ SOLN
INTRAMUSCULAR | Status: DC | PRN
  Administered 2024-12-13: 10 mL

## 2024-12-13 MED ORDER — DOCUSATE SODIUM 100 MG PO CAPS
100.0000 mg | ORAL_CAPSULE | Freq: Two times a day (BID) | ORAL | Status: DC
  Administered 2024-12-13 – 2024-12-14 (×2): 100 mg via ORAL
  Filled 2024-12-13 (×2): qty 1

## 2024-12-13 MED ORDER — CEFAZOLIN SODIUM 1 G IJ SOLR
INTRAMUSCULAR | Status: AC
  Filled 2024-12-13: qty 20

## 2024-12-13 MED ORDER — DEXAMETHASONE SOD PHOSPHATE PF 10 MG/ML IJ SOLN
INTRAMUSCULAR | Status: DC | PRN
  Administered 2024-12-13: 10 mg via INTRAVENOUS

## 2024-12-13 MED ORDER — MENTHOL 3 MG MT LOZG: 1.0000 | LOZENGE | OROMUCOSAL | Status: DC | PRN

## 2024-12-13 MED ORDER — OXYCODONE HCL 5 MG PO TABS
10.0000 mg | ORAL_TABLET | ORAL | Status: DC | PRN
  Administered 2024-12-14 (×3): 10 mg via ORAL
  Filled 2024-12-13 (×3): qty 2

## 2024-12-13 MED ORDER — CEFAZOLIN SODIUM-DEXTROSE 2-4 GM/100ML-% IV SOLN
2.0000 g | Freq: Three times a day (TID) | INTRAVENOUS | Status: AC
  Administered 2024-12-13 – 2024-12-14 (×2): 2 g via INTRAVENOUS
  Filled 2024-12-13 (×2): qty 100

## 2024-12-13 MED ORDER — LIDOCAINE 2% (20 MG/ML) 5 ML SYRINGE
INTRAMUSCULAR | Status: DC | PRN
  Administered 2024-12-13: 100 mg via INTRAVENOUS

## 2024-12-13 MED ORDER — ALPRAZOLAM 0.5 MG PO TABS: 0.5000 mg | ORAL_TABLET | Freq: Two times a day (BID) | ORAL | Status: DC | PRN

## 2024-12-13 MED ORDER — EZETIMIBE 10 MG PO TABS
10.0000 mg | ORAL_TABLET | ORAL | Status: DC
  Filled 2024-12-13: qty 1

## 2024-12-13 MED ORDER — PHENYLEPHRINE 80 MCG/ML (10ML) SYRINGE FOR IV PUSH (FOR BLOOD PRESSURE SUPPORT)
PREFILLED_SYRINGE | INTRAVENOUS | Status: DC | PRN
  Administered 2024-12-13: 160 ug via INTRAVENOUS
  Administered 2024-12-13 (×2): 80 ug via INTRAVENOUS

## 2024-12-13 SURGICAL SUPPLY — 62 items
BAG COUNTER SPONGE SURGICOUNT (BAG) ×1 IMPLANT
BASKET BONE COLLECTION (BASKET) ×1 IMPLANT
BENZOIN TINCTURE PRP APPL 2/3 (GAUZE/BANDAGES/DRESSINGS) ×1 IMPLANT
BLADE CLIPPER SURG (BLADE) IMPLANT
BUR MATCHSTICK NEURO 3.0 LAGG (BURR) ×1 IMPLANT
BUR PRECISION FLUTE 6.0 (BURR) ×1 IMPLANT
CAGE ALTERA 10X31X9-13 15D (Cage) IMPLANT
CANISTER SUCTION 3000ML PPV (SUCTIONS) ×1 IMPLANT
CAP LOCK DLX THRD (Cap) IMPLANT
CAP REVERE LOCKING (Cap) IMPLANT
CLEANSER WND VASHE INSTL 34OZ (WOUND CARE) ×1 IMPLANT
CNTNR URN SCR LID CUP LEK RST (MISCELLANEOUS) ×1 IMPLANT
COVER BACK TABLE 60X90IN (DRAPES) ×1 IMPLANT
DERMABOND ADVANCED .7 DNX12 (GAUZE/BANDAGES/DRESSINGS) IMPLANT
DRAPE C-ARM 42X72 X-RAY (DRAPES) ×2 IMPLANT
DRAPE HALF SHEET 40X57 (DRAPES) ×1 IMPLANT
DRAPE LAPAROTOMY 100X72X124 (DRAPES) ×1 IMPLANT
DRAPE SURG 17X23 STRL (DRAPES) ×1 IMPLANT
DRSG OPSITE POSTOP 4X6 (GAUZE/BANDAGES/DRESSINGS) ×1 IMPLANT
DRSG OPSITE POSTOP 4X8 (GAUZE/BANDAGES/DRESSINGS) IMPLANT
ELECTRODE BLDE 4.0 EZ CLN MEGD (MISCELLANEOUS) ×1 IMPLANT
ELECTRODE REM PT RTRN 9FT ADLT (ELECTROSURGICAL) ×1 IMPLANT
EVACUATOR 1/8 PVC DRAIN (DRAIN) ×1 IMPLANT
GAUZE 4X4 16PLY ~~LOC~~+RFID DBL (SPONGE) ×1 IMPLANT
GLOVE BIO SURGEON STRL SZ 6 (GLOVE) ×1 IMPLANT
GLOVE BIO SURGEON STRL SZ8 (GLOVE) ×2 IMPLANT
GLOVE BIO SURGEON STRL SZ8.5 (GLOVE) ×2 IMPLANT
GLOVE BIOGEL PI IND STRL 6.5 (GLOVE) ×1 IMPLANT
GLOVE SURG SS PI 6.5 STRL IVOR (GLOVE) IMPLANT
GOWN STRL REUS W/ TWL LRG LVL3 (GOWN DISPOSABLE) ×1 IMPLANT
GOWN STRL REUS W/ TWL XL LVL3 (GOWN DISPOSABLE) ×2 IMPLANT
GOWN STRL REUS W/TWL 2XL LVL3 (GOWN DISPOSABLE) IMPLANT
HEMOSTAT POWDER KIT SURGIFOAM (HEMOSTASIS) ×1 IMPLANT
KIT BASIN OR (CUSTOM PROCEDURE TRAY) ×1 IMPLANT
KIT GRAFTMAG DEL NEURO DISP (NEUROSURGERY SUPPLIES) IMPLANT
KIT POSITIONER JACKSON TABLE (MISCELLANEOUS) ×1 IMPLANT
KIT TURNOVER KIT B (KITS) ×1 IMPLANT
NEEDLE HYPO 21X1.5 SAFETY (NEEDLE) ×1 IMPLANT
NEEDLE HYPO 22X1.5 SAFETY MO (MISCELLANEOUS) ×1 IMPLANT
PACK LAMINECTOMY NEURO (CUSTOM PROCEDURE TRAY) ×1 IMPLANT
PAD ARMBOARD POSITIONER FOAM (MISCELLANEOUS) ×3 IMPLANT
PATTIES SURGICAL .5 X1 (DISPOSABLE) IMPLANT
PUTTY DBM GRAFTON 5CC (Putty) IMPLANT
ROD CREO CRVD 6.35X100 (Rod) IMPLANT
ROD CURVED TI 6.35X125 (Rod) IMPLANT
ROD CURVED TI 6.35X150 (Rod) IMPLANT
SCREW PA DLX CREO 7.5X50 (Screw) IMPLANT
SCREW PA DLX CREO 8.5X55 (Screw) IMPLANT
SOLN 0.9% NACL POUR BTL 1000ML (IV SOLUTION) ×1 IMPLANT
SOLN STERILE WATER BTL 1000 ML (IV SOLUTION) ×1 IMPLANT
SPIKE FLUID TRANSFER (MISCELLANEOUS) ×1 IMPLANT
SPONGE NEURO XRAY DETECT 1X3 (DISPOSABLE) IMPLANT
SPONGE SURGIFOAM ABS GEL 100 (HEMOSTASIS) IMPLANT
SPONGE T-LAP 4X18 ~~LOC~~+RFID (SPONGE) IMPLANT
STRIP CLOSURE SKIN 1/2X4 (GAUZE/BANDAGES/DRESSINGS) ×1 IMPLANT
SUT PROLENE 6 0 BV (SUTURE) IMPLANT
SUT VIC AB 1 CT1 18XBRD ANBCTR (SUTURE) ×2 IMPLANT
SUT VIC AB 2-0 CP2 18 (SUTURE) ×2 IMPLANT
SYR 20ML LL LF (SYRINGE) IMPLANT
TOWEL GREEN STERILE (TOWEL DISPOSABLE) ×1 IMPLANT
TOWEL GREEN STERILE FF (TOWEL DISPOSABLE) ×1 IMPLANT
TRAY FOLEY MTR SLVR 16FR STAT (SET/KITS/TRAYS/PACK) ×1 IMPLANT

## 2024-12-13 NOTE — Transfer of Care (Signed)
 Immediate Anesthesia Transfer of Care Note  Patient: Angel Riddle  Procedure(s) Performed: POSTERIOR LUMBAR FUSION LUMBAR FIVE-SACRAL ONE,POSTERIOR INSTRUMENTATION LUMBAR THREE TO THE ILLIUM; EXPLORE FUSION (Spine Lumbar)  Patient Location: PACU  Anesthesia Type:General  Level of Consciousness: drowsy  Airway & Oxygen Therapy: Patient Spontanous Breathing and Patient connected to face mask oxygen  Post-op Assessment: Report given to RN and Post -op Vital signs reviewed and stable  Post vital signs: Reviewed and stable  Last Vitals:  Vitals Value Taken Time  BP 127/67 12/13/24 13:34  Temp 36.6 C 12/13/24 13:34  Pulse 86 12/13/24 13:43  Resp 13 12/13/24 13:43  SpO2 99 % 12/13/24 13:43  Vitals shown include unfiled device data.  Last Pain:  Vitals:   12/13/24 1334  TempSrc:   PainSc: 0-No pain         Complications: No notable events documented.

## 2024-12-13 NOTE — Anesthesia Procedure Notes (Addendum)
 Procedure Name: Intubation Date/Time: 12/13/2024 7:48 AM  Performed by: Hedy Jarred, CRNAPre-anesthesia Checklist: Patient identified, Emergency Drugs available, Suction available and Patient being monitored Patient Re-evaluated:Patient Re-evaluated prior to induction Oxygen Delivery Method: Circle System Utilized Preoxygenation: Pre-oxygenation with 100% oxygen Induction Type: IV induction Ventilation: Mask ventilation without difficulty Laryngoscope Size: Miller and 2 Grade View: Grade I Tube type: Oral Tube size: 7.5 mm Number of attempts: 1 Airway Equipment and Method: Stylet and Oral airway Placement Confirmation: ETT inserted through vocal cords under direct vision, positive ETCO2 and breath sounds checked- equal and bilateral Secured at: 22 cm Tube secured with: Tape Dental Injury: Teeth and Oropharynx as per pre-operative assessment

## 2024-12-13 NOTE — Progress Notes (Signed)
 Orthopedic Tech Progress Note Patient Details:  Angel Riddle Apr 10, 1956 [id number]  Ortho Devices Type of Ortho Device: Lumbar corsett Ortho Device/Splint Location: delivered to 3C Ortho Device/Splint Interventions: Ordered      Tinnie Ronal Brasil 12/13/2024, 4:04 PM

## 2024-12-13 NOTE — H&P (Signed)
 Subjective: The patient is a 69 year old white female who has had multiple previous back surgeries.  She has developed recurrent back and leg pain consistent with neurogenic claudication/lumbar radiculopathy.  She has failed medical management and was worked up with lumbar x-rays and a lumbar MRI.  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  I discussed the various treatment options with her.  She has decided to proceed with surgery.  Past Medical History:  Diagnosis Date   Allergy    Anal fissure    Anxiety    a.) on BZO (alprazolam ) PRN   Aortic atherosclerosis    Arthritis    B12 deficiency    Basal cell carcinoma (BCC) of skin of nose    Chronic back pain    Degenerative disc disease, lumbar    Depression    DOE (dyspnea on exertion)    Dysrhythmia    Sinus Tachycardia - on Metoprolol    Female bladder prolapse    Gallstones    GERD (gastroesophageal reflux disease)    Headache    Hx of migraines, but none in a long time (as of 2025)   Heart murmur    Hx of colonic polyp    Hx: UTI (urinary tract infection)    Hypercholesterolemia    Hypertension    Long term current use of aspirin     Lumbar stenosis    Panic attacks    PE (pulmonary thromboembolism) (HCC) 04/06/2024   Wears partial dentures     Past Surgical History:  Procedure Laterality Date   ABDOMINAL HYSTERECTOMY  1992   partial   CARPOMETACARPAL (CMC) FUSION OF THUMB Left 05/25/2023   Procedure: SUSPENSION ARTHROPLASTY OF LEFT THUMB CMC JOINT;  Surgeon: Edie Norleen PARAS, MD;  Location: [facility];  Service: Orthopedics;  Laterality: Left;   CATARACT EXTRACTION Bilateral [id number]   CHOLECYSTECTOMY  04/19/2014   COLONOSCOPY  07/02/2011   DR. Byrnett   COLONOSCOPY WITH PROPOFOL  N/A 09/25/2015   Procedure: COLONOSCOPY WITH PROPOFOL ;  Surgeon: Rogelia Copping, MD;  Location: [facility];  Service: Endoscopy;  Laterality: N/A;   FOOT SURGERY Right 2009   IR TRANSFOR EPI L/S SNG W/FL/CT   09/21/2024   LUMBAR FUSION  2018   MOUTH SURGERY     dental implants, veneers   POLYPECTOMY  09/25/2015   Procedure: POLYPECTOMY;  Surgeon: Rogelia Copping, MD;  Location: [facility];  Service: Endoscopy;;   skin surgery Left 08/27/2013   Basal cell removal-left nostril   TONSILLECTOMY AND ADENOIDECTOMY  1969    Allergies[1]  Social History   Tobacco Use   Smoking status: Every Day    Current packs/day: 1.00    Average packs/day: 1 pack/day for 48.2 years (48.2 ttl pk-yrs)    Types: Cigarettes    Start date: 2013    Last attempt to quit: 1975   Smokeless tobacco: Never   Tobacco comments:    Smokes 1 PPD  Substance Use Topics   Alcohol use: Yes    Comment: Rarely    Family History  Problem Relation Age of Onset   Hyperlipidemia Mother    Hypertension Mother    Alcohol abuse Father    Hyperlipidemia Father    Heart disease Father    Diabetes Father    Hyperlipidemia Sister    Lung cancer Brother    Hyperlipidemia Brother    Breast cancer Neg Hx    Prior to Admission medications  Medication Sig Start Date End Date Taking? Authorizing Provider  albuterol  (VENTOLIN  HFA) 108 (90 Base) MCG/ACT inhaler Inhale 2 puffs into the lungs every 6 (six) hours as needed for wheezing or shortness of breath. 05/30/24  Yes [name], MD  Alirocumab  (PRALUENT ) 75 MG/ML SOAJ Inject 1 pen (75 mg) into the skin every 14 days. 08/24/24  Yes [name], MD  ALPRAZolam  (XANAX ) 0.5 MG tablet Take 1 tablet (0.5 mg total) by mouth 2 (two) times daily as needed for anxiety. 09/25/24  Yes Alexander, Natalie, DO  Biotin 1 MG CAPS Take 1 mg by mouth daily.   Yes [provider]  Cholecalciferol (VITAMIN D -1000 MAX ST) 25 MCG (1000 UT) tablet Take 1,000 Units by mouth daily.   Yes [provider]  estradiol  (ESTRACE ) 0.01 % CREA vaginal cream Estrogen Cream Instructions Discard applicator Apply pea sized amount to tip of finger to urethra before bed every night for 1 week  the use Monday, Wednesday and Friday. Wash hands well after application. 10/26/24  Yes Francisca Redell BROCKS, MD  ezetimibe  (ZETIA ) 10 MG tablet Take one tablet Monday, Wednesday and Friday. 10/09/24  Yes [name], MD  fentaNYL  (DURAGESIC ) 25 MCG/HR Place 1 patch onto the skin every 3 (three) days. 11/21/24  Yes [provider]  FLUoxetine  (PROZAC ) 40 MG capsule Take 1 capsule (40 mg total) by mouth daily. 10/09/24  Yes [name], MD  HYDROcodone -acetaminophen  (NORCO) 10-325 MG tablet Take 1-2 tablets by mouth every 6 (six) hours as needed for severe pain (pain score 7-10). 09/25/24  Yes Alexander, Natalie, DO  lidocaine  (LIDODERM ) 5 % Place 2 patches onto the skin daily. Remove & Discard patch within 12 hours or as directed by MD 09/26/24  Yes Alexander, Natalie, DO  metoprolol  succinate (TOPROL -XL) 25 MG 24 hr tablet Take 1 tablet (25 mg total) by mouth daily. Take with or immediately following a meal. 11/19/24  Yes Dunn, Bernardino HERO, PA-C  pantoprazole  (PROTONIX ) 40 MG tablet Take 1 tablet (40 mg total) by mouth daily. 10/09/24  Yes [name], MD  potassium chloride  (KLOR-CON ) 10 MEQ tablet Take 1 tablet (10 mEq total) by mouth daily. 10/09/24  Yes [name], MD  pregabalin  (LYRICA ) 150 MG capsule Take 150 mg by mouth 2 (two) times daily. 09/12/24  Yes [provider]  ascorbic acid (VITAMIN C) 500 MG tablet Take 500 mg by mouth daily.    [provider]  magic mouthwash (nystatin , hydrocortisone , diphenhydrAMINE , lidocaine ) suspension Swish and spit 5 mLs 4 (four) times daily as needed for mouth pain. Patient not taking: Reported on 12/07/2024 09/25/24   Alexander, Natalie, DO  pregabalin  (LYRICA ) 200 MG capsule Take 1 capsule (200 mg total) by mouth 2 (two) times daily. Patient not taking: Reported on 12/07/2024 09/25/24   Alexander, Natalie, DO     Review of Systems  Positive ROS: As above  All other systems have been reviewed and were otherwise negative  with the exception of those mentioned in the HPI and as above.  Objective: Vital signs in last 24 hours: Temp:  [98.9 F (37.2 C)] 98.9 F (37.2 C) (01/08 0548) Pulse Rate:  [65] 65 (01/08 0548) Resp:  [17] 17 (01/08 0548) BP: (139)/(80) 139/80 (01/08 0548) SpO2:  [97 %] 97 % (01/08 0548) Weight:  [77.1 kg] 77.1 kg (01/08 0548) Estimated body mass index is 28.29 kg/m as calculated from the following:   Height as of this encounter: 5' 5 (1.651 m).   Weight as of this encounter: 77.1 kg.   General Appearance: Alert Head:  Normocephalic, without obvious abnormality, atraumatic Eyes: PERRL, conjunctiva/corneas clear, EOM's intact,    Ears: Normal  Throat: Normal  Neck: Supple, Back: Limited range of motion, her lumbar incision is well-healed. Lungs: Clear to auscultation bilaterally, respirations unlabored Heart: Regular rate and rhythm, no murmur, rub or gallop Abdomen: Soft, non-tender Extremities: Extremities normal, atraumatic, no cyanosis or edema Skin: unremarkable  NEUROLOGIC:   Mental status: alert and oriented,Motor Exam - grossly normal Sensory Exam - grossly normal Reflexes:  Coordination - grossly normal Gait - grossly normal Balance - grossly normal Cranial Nerves: I: smell Not tested  II: visual acuity  OS: Normal  OD: Normal   II: visual fields Full to confrontation  II: pupils Equal, round, reactive to light  III,VII: ptosis None  III,IV,VI: extraocular muscles  Full ROM  V: mastication Normal  V: facial light touch sensation  Normal  V,VII: corneal reflex  Present  VII: facial muscle function - upper  Normal  VII: facial muscle function - lower Normal  VIII: hearing Not tested  IX: soft palate elevation  Normal  IX,X: gag reflex Present  XI: trapezius strength  5/5  XI: sternocleidomastoid strength 5/5  XI: neck flexion strength  5/5  XII: tongue strength  Normal    Data Review Lab Results  Component Value Date   WBC 9.5 12/10/2024   HGB  13.1 12/10/2024   HCT 40.4 12/10/2024   MCV 91.8 12/10/2024   PLT 280 12/10/2024   Lab Results  Component Value Date   NA 139 12/10/2024   K 4.1 12/10/2024   CL 102 12/10/2024   CO2 26 12/10/2024   BUN 12 12/10/2024   CREATININE 0.82 12/10/2024   GLUCOSE 95 12/10/2024   Lab Results  Component Value Date   INR 1.0 09/13/2024    Assessment/Plan: Lumbosacral foraminal stenosis, lumbosacral herniated disc, lumbosacral radiculopathy: I discussed situation with the patient.  I reviewed her imaging studies with her and pointed out the abnormalities.  We have discussed the various treatment options including surgery.  I have described the surgical treatment option of an exploration of her lumbar fusion with an L5-S1 decompression instrumentation and fusion to the ilium.  I have shown her surgical models.  I have given her surgical pamphlet.  We have discussed the risk, benefits, alternatives, expected postoperative course, and likelihood of achieving her goals with surgery.  I have answered all her questions.  She has decided to proceed with surgery.   Angel Riddle Budge 12/13/2024 7:25 AM         [1]  Allergies Allergen Reactions   Doxycycline Nausea And Vomiting, Nausea Only and Other (See Comments)    doxycycline   Gadolinium Nausea And Vomiting    Used in MRI contrast   Gadolinium Derivatives Nausea And Vomiting and Nausea Only   Iodinated Contrast Media Nausea And Vomiting, Other (See Comments) and Nausea Only    Eyes turned blood red  During a CT scan of the kidneys  During a CT scan of the kidneys    Eyes turned blood red  During a CT scan of the kidneys   Levofloxacin  Other (See Comments)    Muscle Pain  Muscle pain    Muscle Pain   Silicone Other (See Comments)    Pulls skin; anything that does not pull is recommended  Other reaction(s): Other (See Comments) Pulls skin; anything that does not pull is recommended    Pulls skin; anything that does not pull  is recommended   Adhesive [  Tape] Other (See Comments)    Pulls skin, anything that does not pull is recommended    Oxycodone  Other (See Comments)    Pt's family states Mood change with Oxycodone .

## 2024-12-13 NOTE — Anesthesia Postprocedure Evaluation (Signed)
"   Anesthesia Post Note  Patient: Jaquaya A Apo  Procedure(s) Performed: POSTERIOR LUMBAR FUSION LUMBAR FIVE-SACRAL ONE,POSTERIOR INSTRUMENTATION LUMBAR THREE TO THE ILLIUM; EXPLORE FUSION (Spine Lumbar)     Patient location during evaluation: PACU Anesthesia Type: General Level of consciousness: awake and alert Pain management: pain level controlled Vital Signs Assessment: post-procedure vital signs reviewed and stable Respiratory status: spontaneous breathing, nonlabored ventilation, respiratory function stable and patient connected to nasal cannula oxygen Cardiovascular status: blood pressure returned to baseline and stable Postop Assessment: no apparent nausea or vomiting Anesthetic complications: no   No notable events documented.  Last Vitals:  Vitals:   12/13/24 1545 12/13/24 1615  BP: 122/64 112/68  Pulse: 77 79  Resp: 15 15  Temp: 36.7 C   SpO2: 98% 96%    Last Pain:  Vitals:   12/13/24 1530  TempSrc:   PainSc: 5                  Lynwood MARLA Cornea      "

## 2024-12-13 NOTE — Op Note (Signed)
 Brief history: The patient is a 69 year old white female whose had previous lumbar fusions.  She has developed recurrent and worsening back and right leg pain consistent with a lumbosacral radiculopathy.  She failed medical management and was worked up with a lumbar MRI which demonstrated foraminal stenosis at L5-S1.  I discussed the various treatment options with her.  She has decided proceed with surgery.  Preoperative diagnosis: Sacral herniated disc, degenerative disc disease, lumbosacral spinal stenosis compressing both the L5 and the S1 nerve roots; lumbago; lumbar radiculopathy; neurogenic claudication  Postoperative diagnosis: The same  Procedure: Bilateral L5-S1 laminotomy/foraminotomies/medial facetectomy to decompress the bilateral L5 and S1 nerve roots(the work required to do this was in addition to the work required to do the posterior lumbar interbody fusion because of the patient's spinal stenosis, facet arthropathy. Etc. requiring a wide decompression of the nerve roots.); right L5-S1 transforaminal lumbar interbody fusion with local morselized autograft bone and Zimmer DBM; insertion of interbody prosthesis at L5-S1 (globus peek expandable interbody prosthesis); posterior segmental instrumentation from L3 to ilium with globus titanium pedicle screws and rods; insertion of bilateral S2AI ilium screws: Posterior lateral arthrodesis at L5-S1 with local morselized autograft bone and Zimmer DBM; exploration lumbar fusion/removal of lumbar hardware.  Surgeon: Dr. Chyrl Budge  Asst.: Dorn Glade and Duwaine Beck, NP  Anesthesia: Gen. endotracheal  Estimated blood loss: 300 cc  Drains: Medium Hemovac drain in the epidural space Complications: None  Description of procedure: The patient was brought to the operating room by the anesthesia team. General endotracheal anesthesia was induced. The patient was turned to the prone position on the Wilson frame. The patient's lumbosacral region  was then prepared with Betadine scrub and Betadine solution. Sterile drapes were applied.  I then injected the area to be incised with Marcaine  with epinephrine  solution. I then used the scalpel to make a linear midline incision over the L3-4, L4-5 and L5-S1 interspace. I then used electrocautery to perform a bilateral subperiosteal dissection exposing the spinous process and lamina of L3-4, L4-5 and L5-S1, and to expose the old hardware from L3-L5 bilaterally. We then inserted the Verstrac retractor to provide exposure.  I explored the fusion by removing the caps from the old screws from L3-L5 then removing the rod.  The arthrodesis at L3-4 and L4-5 appeared solid.  I began the decompression by using the high speed drill to perform laminotomies at L5-S1 bilaterally. We then used the Kerrison punches to widen the laminotomy and removed the ligamentum flavum at L5 and S1 bilaterally. We used the Kerrison punches to remove the medial facets at L5-S1 bilaterally, I removed the right L5-S1 facet. We performed wide foraminotomies about the bilateral L5 and S1 nerve roots completing the decompression.  Of note we countered a large foraminal herniated disc at L5-S1 on the right which I removed with the pituitary forceps.  We encountered a small pinhole durotomy at L5-S1 on the left, possibly from the previous injection or myelogram.  I closed it with a 6-0 Prolene suture.  We now turned our attention to the posterior lumbar interbody fusion. I used a scalpel to incise the intervertebral disc at L5-S1 bilaterally. I then performed a partial intervertebral discectomy at L5-S1 bilaterally using the pituitary forceps. We prepared the vertebral endplates at L5-S1 bilaterally for the fusion by removing the soft tissues with the curettes. We then used the trial spacers to pick the appropriate sized interbody prosthesis. We prefilled his prosthesis with a combination of local morselized autograft bone that  we obtained  during the decompression as well as Zimmer DBM. We inserted the prefilled prosthesis into the interspace at L5-S1 from the right, we then turned and expanded the prosthesis. There was a good snug fit of the prosthesis in the interspace. We then filled and the remainder of the intervertebral disc space with local morselized autograft bone and Zimmer DBM. This completed the posterior lumbar interbody arthrodesis.  During the decompression and insertion of the prosthesis the assistant protected the thecal sac and nerve roots with the D'Errico retractor.  We now turned attention to the instrumentation. Under fluoroscopic guidance we cannulated the bilateral S1 pedicles with the bone probe. We then removed the bone probe. We then tapped the pedicle with a 6.5 millimeter tap. We then removed the tap. We probed inside the tapped pedicle with a ball probe to rule out cortical breaches. We then inserted a 7.5 x 50 millimeter pedicle screw into the S1 pedicles bilaterally under fluoroscopic guidance. We then palpated along the medial aspect of the pedicles to rule out cortical breaches. There were none. The nerve roots were not injured.  I then used fluoroscopic guidance to place spinal S2 AI 8.5 x 85 screws we then connected the unilateral pedicle screws with a lordotic rod. We compressed the construct and secured the rod in place with the caps. We then tightened the caps appropriately. This completed the instrumentation from L3 to ilium bilaterally.  We now turned our attention to the posterior lateral arthrodesis at L5 and S1. We used the high-speed drill to decorticate the remainder of the facets, pars, transverse process at L5-S1 as. We then applied a combination of local morselized autograft bone and Zimmer DBM over these decorticated posterior lateral structures. This completed the posterior lateral arthrodesis.  We then obtained hemostasis using bipolar electrocautery. We irrigated the wound out with vashe  solution. We inspected the thecal sac and nerve roots and noted they were well decompressed. We then removed the retractor.  I placed a medium medical drain in the epidural space and tunneled out through a separate stab wound. We reapproximated patient's thoracolumbar fascia with interrupted #1 Vicryl suture. We reapproximated patient's subcutaneous tissue with interrupted 2-0 Vicryl suture. The reapproximated patient's skin with Steri-Strips and benzoin. The wound was then coated with bacitracin  ointment. A sterile dressing was applied. The drapes were removed. The patient was subsequently returned to the supine position where they were extubated by the anesthesia team. He was then transported to the post anesthesia care unit in stable condition. All sponge instrument and needle counts were reportedly correct at the end of this case.

## 2024-12-14 DIAGNOSIS — M4807 Spinal stenosis, lumbosacral region: Secondary | ICD-10-CM | POA: Diagnosis not present

## 2024-12-14 MED ORDER — SALINE SPRAY 0.65 % NA SOLN
1.0000 | NASAL | Status: DC | PRN
  Administered 2024-12-14: 1 via NASAL
  Filled 2024-12-14: qty 44

## 2024-12-14 MED ORDER — DOCUSATE SODIUM 100 MG PO CAPS: 100.0000 mg | ORAL_CAPSULE | Freq: Two times a day (BID) | ORAL | 0 refills | Status: DC

## 2024-12-14 MED ORDER — HYDROCODONE-ACETAMINOPHEN 10-325 MG PO TABS: 1.0000 | ORAL_TABLET | ORAL | 0 refills | Status: AC | PRN

## 2024-12-14 MED ORDER — CYCLOBENZAPRINE HCL 5 MG PO TABS: 5.0000 mg | ORAL_TABLET | Freq: Three times a day (TID) | ORAL | 0 refills | Status: AC | PRN

## 2024-12-14 MED FILL — Thrombin For Soln 5000 Unit: CUTANEOUS | Qty: 5000 | Status: AC

## 2024-12-14 NOTE — Discharge Instructions (Signed)
Wound Care Keep incision covered and dry until post op day 3. You may remove the Honeycomb dressing on post op day 3. Leave steri-strips on back.  They will fall off by themselves. Do not put any creams, lotions, or ointments on incision. You are fine to shower. Let water run over incision and pat dry.  Activity Walk each and every day, increasing distance each day. No lifting greater than 5 lbs.  Avoid excessive back motion. No driving for 2 weeks; may ride as a passenger locally.  Diet Resume your normal diet.  Call Your Doctor If Any of These Occur Redness, drainage, or swelling at the wound.  Temperature greater than 101 degrees. Severe pain not relieved by pain medication. Incision starts to come apart.  Follow Up Appt Call [phone] today for appointment in 3-4 weeks if you don't already have one or for any problems.  If you have any hardware placed in your spine, you will need an x-ray before your appointment.

## 2024-12-14 NOTE — Evaluation (Signed)
 Physical Therapy Evaluation  Patient Details Name: Angel Riddle MRN: [id number] DOB: February 17, 1956 Today's Date: 12/14/2024  History of Present Illness  Pt is a 69 y/o female who presents s/p L5-S1 PLIF on 12/13/2024.  PMH significant for Sinus tachy, bladder prolapse, HTN, panic attacks, PE, R foot surgery 2009, prior back surgery 2018.  Clinical Impression  Pt admitted with above diagnosis. At the time of PT eval, pt was able to demonstrate transfers and ambulation with gross CGA to supervision for safety and RW for support. Pt was educated on precautions, brace application/wearing schedule, appropriate activity progression, and car transfer. Pt currently with functional limitations due to the deficits listed below (see PT Problem List). Pt will benefit from skilled PT to increase their independence and safety with mobility to allow discharge to the venue listed below.          If plan is discharge home, recommend the following: A little help with walking and/or transfers;A little help with bathing/dressing/bathroom;Assistance with cooking/housework;Assist for transportation;Help with stairs or ramp for entrance   Can travel by private vehicle        Equipment Recommendations None recommended by PT  Recommendations for Other Services       Functional Status Assessment Patient has had a recent decline in their functional status and demonstrates the ability to make significant improvements in function in a reasonable and predictable amount of time.     Precautions / Restrictions Precautions Precautions: Fall;Back Precaution Booklet Issued: Yes (comment) Recall of Precautions/Restrictions: Intact Precaution/Restrictions Comments: Reviewed handout with pt and sister and pt was cued for precautions during functional mobility. Required Braces or Orthoses: Spinal Brace Spinal Brace: Lumbar corset;Applied in sitting position Restrictions Weight Bearing Restrictions Per Provider Order: No       Mobility  Bed Mobility Overal bed mobility: Needs Assistance Bed Mobility: Rolling, Sidelying to Sit Rolling: Modified independent (Device/Increase time) Sidelying to sit: Supervision       General bed mobility comments: VC's for optimal log roll technique. No assist required. HOB slightly elevated and rails lowered to simulate home environment.    Transfers Overall transfer level: Needs assistance Equipment used: Rolling walker (2 wheels) Transfers: Sit to/from Stand Sit to Stand: Contact guard assist           General transfer comment: Light guard to power up to full stand. VC's for hand placement on seated surface for safety.    Ambulation/Gait Ambulation/Gait assistance: Contact guard assist Gait Distance (Feet): 300 Feet Assistive device: Rolling walker (2 wheels) Gait Pattern/deviations: Step-through pattern, Decreased stride length, Trunk flexed Gait velocity: Decreased Gait velocity interpretation: <1.31 ft/sec, indicative of household ambulator   General Gait Details: VC's fo rimproved posture, closer walker proximity and forward gaze. No assist required for balance but hands on guarding provided throughout for safety. Pt reports headache at base of skull throughout ambulation but reports intensity is unchanged with position changes/walking.  Stairs            Wheelchair Mobility     Tilt Bed    Modified Rankin (Stroke Patients Only)       Balance Overall balance assessment: Needs assistance Sitting-balance support: Feet supported, No upper extremity supported Sitting balance-Leahy Scale: Fair     Standing balance support: Bilateral upper extremity supported, During functional activity, Reliant on assistive device for balance Standing balance-Leahy Scale: Poor Standing balance comment: Dynamically  Pertinent Vitals/Pain Pain Assessment Pain Assessment: Faces Faces Pain Scale: Hurts even  more Pain Location: back/incision site and neck/base of skull Pain Descriptors / Indicators: Operative site guarding, Sore, Headache Pain Intervention(s): Limited activity within patient's tolerance, Monitored during session, Repositioned    Home Living Family/patient expects to be discharged to:: Private residence Living Arrangements: Alone Available Help at Discharge: Family;Available 24 hours/day Type of Home: Other(Comment) (Condo/townhome) Home Access: Level entry       Home Layout: One level Home Equipment: Agricultural Consultant (2 wheels);Rollator (4 wheels);Cane - single point;Shower seat      Prior Function Prior Level of Function : Independent/Modified Independent             Mobility Comments: Pt reports that she typically uses SPC for amb, has recently had to use rollator due to worsening R leg pain ADLs Comments: IND with ADLs     Extremity/Trunk Assessment   Upper Extremity Assessment Upper Extremity Assessment: Defer to OT evaluation    Lower Extremity Assessment Lower Extremity Assessment: Generalized weakness (Mild; consistent with pre-op diagnosis)    Cervical / Trunk Assessment Cervical / Trunk Assessment: Back Surgery  Communication   Communication Communication: No apparent difficulties    Cognition Arousal: Alert Behavior During Therapy: WFL for tasks assessed/performed   PT - Cognitive impairments: No apparent impairments                         Following commands: Intact       Cueing Cueing Techniques: Verbal cues, Gestural cues     General Comments      Exercises     Assessment/Plan    PT Assessment Patient needs continued PT services  PT Problem List Decreased strength;Decreased activity tolerance;Decreased balance;Decreased mobility;Decreased knowledge of use of DME;Decreased safety awareness;Decreased knowledge of precautions;Pain       PT Treatment Interventions DME instruction;Gait training;Stair  training;Functional mobility training;Therapeutic activities;Therapeutic exercise;Balance training;Patient/family education    PT Goals (Current goals can be found in the Care Plan section)  Acute Rehab PT Goals Patient Stated Goal: Home today, decrease headache PT Goal Formulation: With patient/family Time For Goal Achievement: 12/21/24 Potential to Achieve Goals: Good    Frequency Min 5X/week     Co-evaluation               AM-PAC PT 6 Clicks Mobility  Outcome Measure Help needed turning from your back to your side while in a flat bed without using bedrails?: None Help needed moving from lying on your back to sitting on the side of a flat bed without using bedrails?: A Little Help needed moving to and from a bed to a chair (including a wheelchair)?: A Little Help needed standing up from a chair using your arms (e.g., wheelchair or bedside chair)?: A Little Help needed to walk in hospital room?: A Little Help needed climbing 3-5 steps with a railing? : A Little 6 Click Score: 19    End of Session Equipment Utilized During Treatment: Gait belt;Back brace Activity Tolerance: Patient tolerated treatment well Patient left: in bed;with call bell/phone within reach;with nursing/sitter in room;with family/visitor present Nurse Communication: Mobility status PT Visit Diagnosis: Unsteadiness on feet (R26.81);Pain Pain - part of body:  (back/head)    Time: 9095-9065 PT Time Calculation (min) (ACUTE ONLY): 30 min   Charges:   PT Evaluation $PT Eval Low Complexity: 1 Low PT Treatments $Gait Training: 8-22 mins PT General Charges $$ ACUTE PT VISIT: 1 Visit  Leita Sable, PT, DPT Acute Rehabilitation Services Secure Chat Preferred Office: [phone]   Leita JONETTA Sable 12/14/2024, 11:44 AM

## 2024-12-14 NOTE — Progress Notes (Signed)
 Patient alert and oriented, ambulated, voided. Surgical site clean and dry no sign of infection. Patient stated her headache is better. D/c instructions explain and given to the patient.

## 2024-12-14 NOTE — Progress Notes (Signed)
 Subjective: The patient is alert and pleasant.  Her back and leg feels much better.  She complains of a headache which began this morning at about 6 AM.  Objective: Vital signs in last 24 hours: Temp:  [97.8 F (36.6 C)-99.3 F (37.4 C)] 99.3 F (37.4 C) (01/09 0333) Pulse Rate:  [65-87] 73 (01/09 0333) Resp:  [12-20] 18 (01/09 0333) BP: (105-135)/(51-75) 120/66 (01/09 0333) SpO2:  [92 %-100 %] 94 % (01/09 0333) Estimated body mass index is 28.29 kg/m as calculated from the following:   Height as of this encounter: 5' 5 (1.651 m).   Weight as of this encounter: 77.1 kg.   Intake/Output from previous day: 01/08 0701 - 01/09 0700 In: 1730 [P.O.:480; I.V.:1000; IV Piggyback:250] Out: 800 [Urine:200; Drains:500; Blood:100] Intake/Output this shift: No intake/output data recorded.  Physical exam patient is alert and oriented.  Her strength is normal.  Her drain has put out about 250 cc.  Lab Results: No results for input(s): WBC, HGB, HCT, PLT in the last 72 hours. BMET No results for input(s): NA, K, CL, CO2, GLUCOSE, BUN, CREATININE, CALCIUM in the last 72 hours.  Studies/Results: DG Lumbar Spine 2-3 Views Result Date: 12/13/2024 EXAM: FLUOROSCOPIC IMAGING TECHNIQUE: Fluoroscopy was provided by the radiology department for procedure. Radiologist was not present during examination. Fluoroscopy time: 47.5 seconds. RADIATION DOSE INDEX: Reference Air Kerma: 22.14 mGy COMPARISON: None available. CLINICAL HISTORY: Elective surgery FINDINGS: Intraoperative fluoroscopic imaging was performed during posterior lumbar fusion. IMPRESSION: 1. Intraoperative fluoroscopic imaging as above. Please refer to the operative report for full details. Electronically signed by: Pinkie Pebbles MD MD 12/13/2024 07:20 PM EST RP Workstation: [id number]   DG C-Arm 1-60 Min-No Report Result Date: 12/13/2024 Fluoroscopy was utilized by the requesting physician.  No radiographic  interpretation.   DG C-Arm 1-60 Min-No Report Result Date: 12/13/2024 Fluoroscopy was utilized by the requesting physician.  No radiographic interpretation.    Assessment/Plan: Postop day 1: The patient is doing well.  We will mobilize her with PT.  She might go home later on today.  I gave her discharge instructions and answered all her questions.  Headaches: At surgery I did notice a myelogram whole which I repared with suture.  We will observe her headaches.  LOS: 0 days     Reyes JONETTA Budge 12/14/2024, 7:49 AM     Patient ID: Angel Riddle, female   DOB: Apr 04, 1956, 69 y.o.   MRN: [id number]

## 2024-12-14 NOTE — Care Management Obs Status (Signed)
 MEDICARE OBSERVATION STATUS NOTIFICATION   Patient Details  Name: Angel Riddle MRN: [id number] Date of Birth: 03-01-1956   Medicare Observation Status Notification Given:  Yes    Jennie Laneta Dragon 12/14/2024, 9:59 AM

## 2024-12-14 NOTE — Discharge Summary (Signed)
 Physician Discharge Summary     Providing Compassionate, Quality Care - Together   Patient ID: Angel Riddle MRN: [id number] DOB/AGE: 06-07-1956 69 y.o.  Admit date: 12/13/2024 Discharge date: 12/14/2024  Admission Diagnoses: Foraminal stenosis of lumbosacral region  Discharge Diagnoses:  Principal Problem:   Foraminal stenosis of lumbosacral region   Discharged Condition: good  Hospital Course: Patient underwent extension of her lumbar fusion by Dr. Mavis on 12/2024. She was admitted to 3C04  following recovery from anesthesia in the PACU. Her postoperative course has been uncomplicated. She has worked with physical therapy who feels the patient is ready for discharge home. She is ambulating independently and without difficulty. She is tolerating a normal diet. She is not having any bowel or bladder dysfunction. Her pain is well-controlled with oral pain medication. She is ready for discharge home.   Consults: PT/TOC  Significant Diagnostic Studies: radiology: DG Lumbar Spine 2-3 Views Result Date: 12/13/2024 EXAM: FLUOROSCOPIC IMAGING TECHNIQUE: Fluoroscopy was provided by the radiology department for procedure. Radiologist was not present during examination. Fluoroscopy time: 47.5 seconds. RADIATION DOSE INDEX: Reference Air Kerma: 22.14 mGy COMPARISON: None available. CLINICAL HISTORY: Elective surgery FINDINGS: Intraoperative fluoroscopic imaging was performed during posterior lumbar fusion. IMPRESSION: 1. Intraoperative fluoroscopic imaging as above. Please refer to the operative report for full details. Electronically signed by: Pinkie Pebbles MD MD 12/13/2024 07:20 PM EST RP Workstation: [id number]   DG C-Arm 1-60 Min-No Report Result Date: 12/13/2024 Fluoroscopy was utilized by the requesting physician.  No radiographic interpretation.   DG C-Arm 1-60 Min-No Report Result Date: 12/13/2024 Fluoroscopy was utilized by the requesting physician.  No radiographic interpretation.      Treatments: surgery:  Bilateral L5-S1 laminotomy/foraminotomies/medial facetectomy to decompress the bilateral L5 and S1 nerve roots(the work required to do this was in addition to the work required to do the posterior lumbar interbody fusion because of the patient's spinal stenosis, facet arthropathy. Etc. requiring a wide decompression of the nerve roots.); right L5-S1 transforaminal lumbar interbody fusion with local morselized autograft bone and Zimmer DBM; insertion of interbody prosthesis at L5-S1 (globus peek expandable interbody prosthesis); posterior segmental instrumentation from L3 to ilium with globus titanium pedicle screws and rods; insertion of bilateral S2AI ilium screws: Posterior lateral arthrodesis at L5-S1 with local morselized autograft bone and Zimmer DBM; exploration lumbar fusion/removal of lumbar hardware.    Discharge Exam: Blood pressure (!) 102/59, pulse 78, temperature 98.8 F (37.1 C), temperature source Oral, resp. rate 20, height 5' 5 (1.651 m), weight 77.1 kg, SpO2 95%.   Per report: Alert and oriented x 4 PERRLA CN II-XII grossly intact MAE, Strength and sensation intact Incision is covered with Honeycomb dressing and Steri Strips; Dressing is clean, dry, and intact   Disposition: Discharge disposition: 01-Home or Self Care       Discharge Instructions     Call MD for:  difficulty breathing, headache or visual disturbances   Complete by: As directed    Call MD for:  hives   Complete by: As directed    Call MD for:  persistant nausea and vomiting   Complete by: As directed    Call MD for:  redness, tenderness, or signs of infection (pain, swelling, redness, odor or green/yellow discharge around incision site)   Complete by: As directed    Call MD for:  severe uncontrolled pain   Complete by: As directed    If the dressing is still on your incision site when you go home, remove it on  the third day after your surgery date. Remove dressing if  it begins to fall off, or if it is dirty or damaged before the third day.   Complete by: As directed    Increase activity slowly   Complete by: As directed       Allergies as of 12/14/2024       Reactions   Doxycycline Nausea And Vomiting, Nausea Only, Other (See Comments)   doxycycline   Gadolinium Nausea And Vomiting   Used in MRI contrast   Gadolinium Derivatives Nausea And Vomiting, Nausea Only   Iodinated Contrast Media Nausea And Vomiting, Other (See Comments), Nausea Only   Eyes turned blood red During a CT scan of the kidneys During a CT scan of the kidneys    Eyes turned blood red  During a CT scan of the kidneys   Levofloxacin  Other (See Comments)   Muscle Pain Muscle pain    Muscle Pain   Silicone Other (See Comments)   Pulls skin; anything that does not pull is recommended Other reaction(s): Other (See Comments) Pulls skin; anything that does not pull is recommended    Pulls skin; anything that does not pull is recommended   Adhesive [tape] Other (See Comments)   Pulls skin, anything that does not pull is recommended    Oxycodone  Other (See Comments)   Pt's family states Mood change with Oxycodone .        Medication List     TAKE these medications    albuterol  108 (90 Base) MCG/ACT inhaler Commonly known as: VENTOLIN  HFA Inhale 2 puffs into the lungs every 6 (six) hours as needed for wheezing or shortness of breath.   ALPRAZolam  0.5 MG tablet Commonly known as: XANAX  Take 1 tablet (0.5 mg total) by mouth 2 (two) times daily as needed for anxiety.   ascorbic acid 500 MG tablet Commonly known as: VITAMIN C Take 500 mg by mouth daily.   Biotin 1 MG Caps Take 1 mg by mouth daily.   cyclobenzaprine  5 MG tablet Commonly known as: FLEXERIL  Take 1 tablet (5 mg total) by mouth 3 (three) times daily as needed for muscle spasms.   docusate sodium  100 MG capsule Commonly known as: COLACE Take 1 capsule (100 mg total) by mouth 2 (two) times daily.    estradiol  0.01 % Crea vaginal cream Commonly known as: ESTRACE  Estrogen Cream Instructions Discard applicator Apply pea sized amount to tip of finger to urethra before bed every night for 1 week the use Monday, Wednesday and Friday. Wash hands well after application.   ezetimibe  10 MG tablet Commonly known as: Zetia  Take one tablet Monday, Wednesday and Friday.   fentaNYL  25 MCG/HR Commonly known as: DURAGESIC  Place 1 patch onto the skin every 3 (three) days.   FLUoxetine  40 MG capsule Commonly known as: PROZAC  Take 1 capsule (40 mg total) by mouth daily.   HYDROcodone -acetaminophen  10-325 MG tablet Commonly known as: NORCO Take 1 tablet by mouth every 4 (four) hours as needed for severe pain (pain score 7-10) (Postoperative pain). What changed:  how much to take when to take this reasons to take this   lidocaine  5 % Commonly known as: LIDODERM  Place 2 patches onto the skin daily. Remove & Discard patch within 12 hours or as directed by MD   magic mouthwash (nystatin , hydrocortisone , diphenhydrAMINE , lidocaine ) suspension Swish and spit 5 mLs 4 (four) times daily as needed for mouth pain.   metoprolol  succinate 25 MG 24 hr tablet Commonly known  as: TOPROL -XL Take 1 tablet (25 mg total) by mouth daily. Take with or immediately following a meal.   pantoprazole  40 MG tablet Commonly known as: PROTONIX  Take 1 tablet (40 mg total) by mouth daily.   potassium chloride  10 MEQ tablet Commonly known as: KLOR-CON  Take 1 tablet (10 mEq total) by mouth daily.   Praluent  75 MG/ML Soaj Generic drug: Alirocumab  Inject 1 pen (75 mg) into the skin every 14 days.   pregabalin  150 MG capsule Commonly known as: LYRICA  Take 150 mg by mouth 2 (two) times daily.   pregabalin  200 MG capsule Commonly known as: LYRICA  Take 1 capsule (200 mg total) by mouth 2 (two) times daily.   Vitamin D -1000 Max St 25 MCG (1000 UT) tablet Generic drug: Cholecalciferol Take 1,000 Units by mouth  daily.               Discharge Care Instructions  (From admission, onward)           Start     Ordered   12/14/24 0000  If the dressing is still on your incision site when you go home, remove it on the third day after your surgery date. Remove dressing if it begins to fall off, or if it is dirty or damaged before the third day.        12/14/24 1259            Follow-up Information     [name], MD. Go on 01/11/2025.   Specialty: Neurosurgery Why: First post op appointment with x-rays id on 01/11/2025 at 1:45 PM. Contact information: [address] [place] [address] [phone]                 Signed: Gerard Beck, DNP, AGNP-C Nurse Practitioner  [facility] [address], [address], [place], [place] [address] P: [phone]    F: [phone]  12/14/2024, 12:59 PM

## 2024-12-17 MED FILL — Sodium Chloride IV Soln 0.9%: INTRAVENOUS | Qty: 2000 | Status: AC

## 2024-12-17 MED FILL — Heparin Sodium (Porcine) Inj 1000 Unit/ML: INTRAMUSCULAR | Qty: 30 | Status: AC

## 2025-01-03 ENCOUNTER — Other Ambulatory Visit: Payer: Self-pay | Admitting: Internal Medicine

## 2025-01-08 ENCOUNTER — Other Ambulatory Visit

## 2025-01-10 ENCOUNTER — Emergency Department

## 2025-01-10 ENCOUNTER — Encounter: Payer: Self-pay | Admitting: Internal Medicine

## 2025-01-10 ENCOUNTER — Emergency Department
Admission: EM | Admit: 2025-01-10 | Discharge: 2025-01-10 | Disposition: A | Discharge status: Home / Self Care | Attending: Emergency Medicine | Admitting: Emergency Medicine

## 2025-01-10 ENCOUNTER — Other Ambulatory Visit

## 2025-01-10 ENCOUNTER — Other Ambulatory Visit: Payer: Self-pay

## 2025-01-10 ENCOUNTER — Ambulatory Visit: Admitting: Internal Medicine

## 2025-01-10 VITALS — BP 120/82 | HR 102 | Temp 98.6°F | Ht 65.0 in | Wt 171.0 lb

## 2025-01-10 DIAGNOSIS — R000 Tachycardia, unspecified: Secondary | ICD-10-CM

## 2025-01-10 DIAGNOSIS — I10 Essential (primary) hypertension: Secondary | ICD-10-CM

## 2025-01-10 DIAGNOSIS — R1013 Epigastric pain: Secondary | ICD-10-CM | POA: Insufficient documentation

## 2025-01-10 DIAGNOSIS — K219 Gastro-esophageal reflux disease without esophagitis: Secondary | ICD-10-CM

## 2025-01-10 DIAGNOSIS — F17210 Nicotine dependence, cigarettes, uncomplicated: Secondary | ICD-10-CM | POA: Insufficient documentation

## 2025-01-10 DIAGNOSIS — K2900 Acute gastritis without bleeding: Secondary | ICD-10-CM | POA: Insufficient documentation

## 2025-01-10 LAB — COMPREHENSIVE METABOLIC PANEL WITH GFR
ALT: 11 U/L (ref 0–44)
AST: 27 U/L (ref 15–41)
Albumin: 4.2 g/dL (ref 3.5–5.0)
Alkaline Phosphatase: 209 U/L — ABNORMAL HIGH (ref 38–126)
Anion gap: 14 (ref 5–15)
BUN: 16 mg/dL (ref 8–23)
CO2: 22 mmol/L (ref 22–32)
Calcium: 9.4 mg/dL (ref 8.9–10.3)
Chloride: 102 mmol/L (ref 98–111)
Creatinine, Ser: 0.76 mg/dL (ref 0.44–1.00)
GFR, Estimated: >60 mL/min
Glucose, Bld: 103 mg/dL — ABNORMAL HIGH (ref 70–99)
Potassium: 4.2 mmol/L (ref 3.5–5.1)
Sodium: 139 mmol/L (ref 135–145)
Total Bilirubin: 0.3 mg/dL (ref 0.0–1.2)
Total Protein: 7.6 g/dL (ref 6.5–8.1)

## 2025-01-10 LAB — LIPASE, BLOOD: Lipase: 99 U/L — ABNORMAL HIGH (ref 11–51)

## 2025-01-10 LAB — CBC
HCT: 38.8 % (ref 36.0–46.0)
Hemoglobin: 12.8 g/dL (ref 12.0–15.0)
MCH: 29.1 pg (ref 26.0–34.0)
MCHC: 33 g/dL (ref 30.0–36.0)
MCV: 88.2 fL (ref 80.0–100.0)
Platelets: 433 10*3/uL — ABNORMAL HIGH (ref 150–400)
RBC: 4.4 MIL/uL (ref 3.87–5.11)
RDW: 13 % (ref 11.5–15.5)
WBC: 15.2 10*3/uL — ABNORMAL HIGH (ref 4.0–10.5)
nRBC: 0 % (ref 0.0–0.2)

## 2025-01-10 MED ORDER — EZETIMIBE 10 MG PO TABS: ORAL_TABLET | ORAL | 1 refills | Status: AC

## 2025-01-10 MED ORDER — FLUOXETINE HCL 40 MG PO CAPS: ORAL_CAPSULE | ORAL | 1 refills | Status: AC

## 2025-01-10 MED ORDER — SODIUM CHLORIDE 0.9 % IV BOLUS
500.0000 mL | Freq: Once | INTRAVENOUS | Status: AC
  Administered 2025-01-10: 500 mL via INTRAVENOUS

## 2025-01-10 MED ORDER — POTASSIUM CHLORIDE ER 10 MEQ PO TBCR: 10.0000 meq | EXTENDED_RELEASE_TABLET | Freq: Every day | ORAL | 1 refills | Status: AC

## 2025-01-10 MED ORDER — SUCRALFATE 1 G PO TABS: 1.0000 g | ORAL_TABLET | Freq: Three times a day (TID) | ORAL | 0 refills | Status: AC

## 2025-01-10 MED ORDER — FENTANYL CITRATE (PF) 50 MCG/ML IJ SOSY
50.0000 ug | PREFILLED_SYRINGE | Freq: Once | INTRAMUSCULAR | Status: AC
  Administered 2025-01-10: 50 ug via INTRAVENOUS
  Filled 2025-01-10: qty 1

## 2025-01-10 NOTE — Assessment & Plan Note (Signed)
 Increased pain - epigastric region. Increased nausea, dry heaves. Has not eaten today. Weakness. Given increased pain and weakness, discussed need for further w/up and evaluation. Agreeable. ER notified.

## 2025-01-10 NOTE — Assessment & Plan Note (Signed)
 Continue protonix 

## 2025-01-10 NOTE — ED Provider Notes (Signed)
 "  [facility] Provider Note    Event Date/Time   First MD Initiated Contact with Patient 01/10/25 1732     (approximate)   History   Abdominal Pain   HPI  Angel Riddle is a 69 y.o. female with a history of GERD on pantoprazole  who presents with complaints of epigastric abdominal pain, she reports this started this morning and has fluctuated throughout the day, she reports it is somewhat improved right now.  No chest pain.  No cough.  No back pain.  Does smoke cigarettes, no history of alcohol usage.     Physical Exam   Triage Vital Signs: ED Triage Vitals  Encounter Vitals Group     BP 01/10/25 1715 119/78     Girls Systolic BP Percentile --      Girls Diastolic BP Percentile --      Boys Systolic BP Percentile --      Boys Diastolic BP Percentile --      Pulse Rate 01/10/25 1715 (!) 110     Resp 01/10/25 1715 16     Temp 01/10/25 1715 99.7 F (37.6 C)     Temp Source 01/10/25 1715 Oral     SpO2 01/10/25 1715 93 %     Weight 01/10/25 1630 77.6 kg (171 lb)     Height 01/10/25 1630 1.651 m (5' 5)     Head Circumference --      Peak Flow --      Pain Score 01/10/25 1630 4     Pain Loc --      Pain Education --      Exclude from Growth Chart --     Most recent vital signs: Vitals:   01/10/25 1715  BP: 119/78  Pulse: (!) 110  Resp: 16  Temp: 99.7 F (37.6 C)  SpO2: 93%     General: Awake, no distress.  CV:  Good peripheral perfusion.  Regular rate and rhythm Resp:  Normal effort.  CTA bilaterally Abd:  No distention.  Mild tenderness in the epigastrium but overall reassuring exam. Other:     ED Results / Procedures / Treatments   Labs (all labs ordered are listed, but only abnormal results are displayed) Labs Reviewed  LIPASE, BLOOD - Abnormal; Notable for the following components:      Result Value   Lipase 99 (*)    All other components within normal limits  COMPREHENSIVE METABOLIC PANEL WITH GFR - Abnormal; Notable  for the following components:   Glucose, Bld 103 (*)    Alkaline Phosphatase 209 (*)    All other components within normal limits  CBC - Abnormal; Notable for the following components:   WBC 15.2 (*)    Platelets 433 (*)    All other components within normal limits  URINALYSIS, ROUTINE W REFLEX MICROSCOPIC     EKG  ED ECG REPORT I, Lamar Price, the attending physician, personally viewed and interpreted this ECG.  Date: 01/10/2025  Rhythm: normal sinus rhythm QRS Axis: normal Intervals: normal ST/T Wave abnormalities: normal Narrative Interpretation: no evidence of acute ischemia    RADIOLOGY CT abdomen pelvis is reassuring    PROCEDURES:  Critical Care performed:   Procedures   MEDICATIONS ORDERED IN ED: Medications  sodium chloride  0.9 % bolus 500 mL (0 mLs Intravenous Stopped 01/10/25 1922)  fentaNYL  (SUBLIMAZE ) injection 50 mcg (50 mcg Intravenous Given 01/10/25 1830)     IMPRESSION / MDM / ASSESSMENT AND PLAN / ED COURSE  I reviewed the triage vital signs and the nursing notes. Patient's presentation is most consistent with acute illness / injury with system symptoms.  Patient presents with epigastric abdominal pain as detailed above, suspicious for gastritis, less likely pancreatitis.  She has had a history of a cholecystectomy  Lab work is notable for mild elevation of lipase, this is nonspecific will send for CT abdomen pelvis to see if there is any evidence of pancreatic inflammation, doubt pancreatitis as a cause however.  Notably her white blood cell count is mildly elevated however she is afebrile here, denies cough, no dysuria no diarrhea.  Possibility of early gastroenteritis  CT scan is reassuring, she is feeling better after treatment, will DC with sucralfate , close follow-up with gastroenterology, return precautions discussed, she agrees with this plan.        FINAL CLINICAL IMPRESSION(S) / ED DIAGNOSES   Final diagnoses:  Acute  gastritis without hemorrhage, unspecified gastritis type     Rx / DC Orders   ED Discharge Orders          Ordered    sucralfate  (CARAFATE ) 1 g tablet  3 times daily with meals & bedtime        01/10/25 1918    Ambulatory referral to Gastroenterology        01/10/25 1918             Note:  This document was prepared using Dragon voice recognition software and may include unintentional dictation errors.   Arlander Charleston, MD 01/10/25 2311  "

## 2025-01-10 NOTE — Assessment & Plan Note (Signed)
 Heart rate increased. Feel related to increased pain. aEKG - SR/ST. W/up planned in ER as outlined.

## 2025-01-10 NOTE — Progress Notes (Signed)
 "  Subjective:    Patient ID: Angel Riddle, female    DOB: 11-May-1956, 69 y.o.   MRN: [id number]  Patient here for  Chief Complaint  Patient presents with   Medical Management of Chronic Issues    HPI Here for a scheduled follow up. S/p low back surgery 12/13/24 - Dr Mavis. Saw neurology 10/25/24 - migraine visual aura with transient recurrent diplopia. Recommended magnesium . MRI ordered. MRI 12/11/24 - no acute intracranial abnormality. 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 She presented today for follow up. Reports not feeling well today. Noticed - she started feeling bad when she started getting ready to come to her appt. Reports now feeling - upper abdominal discomfort - epigastric pain. Increased nausea. Dry heaving. Had bowel movement in office. No diarrhea. Still with increased pain and weakness. Has not eaten today.    Past Medical History:  Diagnosis Date   Allergy    Anal fissure    Anxiety    a.) on BZO (alprazolam ) PRN   Aortic atherosclerosis    Arthritis    B12 deficiency    Basal cell carcinoma (BCC) of skin of nose    Chronic back pain    Degenerative disc disease, lumbar    Depression    DOE (dyspnea on exertion)    Dysrhythmia    Sinus Tachycardia - on Metoprolol    Female bladder prolapse    Gallstones    GERD (gastroesophageal reflux disease)    Headache    Hx of migraines, but none in a long time (as of 2025)   Heart murmur    Hx of colonic polyp    Hx: UTI (urinary tract infection)    Hypercholesterolemia    Hypertension    Long term current use of aspirin     Lumbar stenosis    Panic attacks    PE (pulmonary thromboembolism) (HCC) 04/06/2024   Wears partial dentures    Past Surgical History:   Procedure Laterality Date   ABDOMINAL HYSTERECTOMY  1992   partial   CARPOMETACARPAL (CMC) FUSION OF THUMB Left 05/25/2023   Procedure: SUSPENSION ARTHROPLASTY OF LEFT THUMB CMC JOINT;  Surgeon: Edie Norleen PARAS, MD;  Location: [facility];  Service: Orthopedics;  Laterality: Left;   CATARACT EXTRACTION Bilateral [id number]   CHOLECYSTECTOMY  04/19/2014   COLONOSCOPY  07/02/2011   DR. Byrnett   COLONOSCOPY WITH PROPOFOL  N/A 09/25/2015   Procedure: COLONOSCOPY WITH PROPOFOL ;  Surgeon: Rogelia Copping, MD;  Location: [facility];  Service: Endoscopy;  Laterality: N/A;   FOOT SURGERY Right 2009   IR TRANSFOR EPI L/S SNG W/FL/CT  09/21/2024   LUMBAR FUSION  2018   MOUTH SURGERY     dental implants, veneers   POLYPECTOMY  09/25/2015   Procedure: POLYPECTOMY;  Surgeon: Rogelia Copping, MD;  Location: [facility];  Service: Endoscopy;;   skin surgery Left 08/27/2013   Basal cell removal-left nostril   TONSILLECTOMY AND ADENOIDECTOMY  1969   Family History  Problem Relation Age of Onset   Hyperlipidemia Mother    Hypertension Mother    Alcohol abuse Father    Hyperlipidemia Father    Heart disease Father    Diabetes Father    Hyperlipidemia Sister    Lung cancer Brother    Hyperlipidemia Brother  Breast cancer Neg Hx    Social History   Socioeconomic History   Marital status: Widowed    Spouse name: Not on file   Number of children: 2   Years of education: Not on file   Highest education level: Some college, no degree  Occupational History   Not on file  Tobacco Use   Smoking status: Every Day    Current packs/day: 1.00    Average packs/day: 1 pack/day for 48.3 years (48.3 ttl pk-yrs)    Types: Cigarettes    Start date: 2013    Last attempt to quit: 1975   Smokeless tobacco: Never   Tobacco comments:    Smokes 1 PPD  Vaping Use   Vaping status: Never Used  Substance and Sexual Activity   Alcohol use: Yes    Comment: Rarely   Drug use: No    Comment: Delta 9  Gummies - in the evening   Sexual activity: Not Currently    Birth control/protection: None  Other Topics Concern   Not on file  Social History Narrative   Lives alone.   Social Drivers of Health   Tobacco Use: High Risk (01/10/2025)   Patient History    Smoking Tobacco Use: Every Day    Smokeless Tobacco Use: Never    Passive Exposure: Not on file  Financial Resource Strain: Low Risk  (10/25/2024)   Received from [facility]   Overall Financial Resource Strain (CARDIA)    Difficulty of Paying Living Expenses: Not hard at all  Food Insecurity: No Food Insecurity (10/25/2024)   Received from [facility]   Epic    Within the past 12 months, you worried that your food would run out before you got the money to buy more.: Never true    Within the past 12 months, the food you bought just didn't last and you didn't have money to get more.: Never true  Transportation Needs: No Transportation Needs (10/25/2024)   Received from [facility] - Transportation    In the past 12 months, has lack of transportation kept you from medical appointments or from getting medications?: No    Lack of Transportation (Non-Medical): No  Physical Activity: Unknown (06/23/2023)   Exercise Vital Sign    Days of Exercise per Week: 0 days    Minutes of Exercise per Session: Not on file  Stress: No Stress Concern Present (06/23/2023)   [employer] - Occupational Stress Questionnaire    Feeling of Stress : Only a little  Social Connections: Moderately Integrated (09/13/2024)   Social Connection and Isolation Panel    Frequency of Communication with Friends and Family: More than three times a week    Frequency of Social Gatherings with Friends and Family: Three times a week    Attends Religious Services: More than 4 times per year    Active Member of Clubs or Organizations: No    Attends Banker Meetings: More  than 4 times per year    Marital Status: Widowed  Depression (PHQ2-9): Low Risk (01/10/2025)   Depression (PHQ2-9)    PHQ-2 Score: 3  Alcohol Screen: Low Risk (06/23/2023)   Alcohol Screen    Last Alcohol Screening Score (AUDIT): 1  Housing: Low Risk  (10/25/2024)   Received from [facility]   Epic    In the last 12 months, was there a time when you were not able to pay  the mortgage or rent on time?: No    In the past 12 months, how many times have you moved where you were living?: 0    At any time in the past 12 months, were you homeless or living in a shelter (including now)?: No  Utilities: Not At Risk (10/25/2024)   Received from [facility]   Epic    In the past 12 months has the electric, gas, oil, or water  company threatened to shut off services in your home?: No  Health Literacy: Not on file     Review of Systems  Constitutional:  Positive for fatigue. Negative for fever.  HENT:  Negative for congestion and sinus pressure.   Respiratory:  Negative for cough and chest tightness.   Cardiovascular:  Negative for chest pain.       No increased swelling.   Gastrointestinal:  Positive for abdominal pain and nausea. Negative for diarrhea.       Epigastric pain. Dry heaves.   Genitourinary:  Negative for difficulty urinating and dysuria.  Musculoskeletal:  Negative for joint swelling.  Skin:  Negative for color change and rash.  Neurological:  Negative for dizziness and headaches.  Psychiatric/Behavioral:  Negative for agitation and dysphoric mood.        Objective:     BP 118/80   Pulse (!) 108   Temp 98.6 F (37 C) (Oral)   Ht 5' 5 (1.651 m)   Wt 171 lb (77.6 kg)   SpO2 98%   BMI 28.46 kg/m  Wt Readings from Last 3 Encounters:  01/10/25 171 lb (77.6 kg)  12/13/24 170 lb (77.1 kg)  12/10/24 173 lb (78.5 kg)    Physical Exam Vitals reviewed.  Constitutional:      General: She is not in acute distress.    Appearance: Normal  appearance.  HENT:     Head: Normocephalic and atraumatic.     Right Ear: External ear normal.     Left Ear: External ear normal.     Mouth/Throat:     Pharynx: No oropharyngeal exudate or posterior oropharyngeal erythema.  Eyes:     General: No scleral icterus.       Right eye: No discharge.        Left eye: No discharge.     Conjunctiva/sclera: Conjunctivae normal.  Neck:     Thyroid : No thyromegaly.  Cardiovascular:     Rate and Rhythm: Normal rate and regular rhythm.  Pulmonary:     Effort: No respiratory distress.     Breath sounds: Normal breath sounds. No wheezing.  Abdominal:     General: Bowel sounds are normal.     Palpations: Abdomen is soft.     Comments: Increased pain - epigastric region.   Musculoskeletal:        General: No swelling or tenderness.     Cervical back: Neck supple. No tenderness.  Lymphadenopathy:     Cervical: No cervical adenopathy.  Skin:    Findings: No erythema or rash.  Neurological:     Mental Status: She is alert.  Psychiatric:        Mood and Affect: Mood normal.        Behavior: Behavior normal.         Outpatient Encounter Medications as of 01/10/2025  Medication Sig   albuterol  (VENTOLIN  HFA) 108 (90 Base) MCG/ACT inhaler Inhale 2 puffs into the lungs every 6 (six) hours as needed for wheezing or shortness of breath.  Alirocumab  (PRALUENT ) 75 MG/ML SOAJ Inject 1 pen (75 mg) into the skin every 14 days.   ALPRAZolam  (XANAX ) 0.5 MG tablet Take 1 tablet (0.5 mg total) by mouth daily as needed for anxiety.   ascorbic acid (VITAMIN C) 500 MG tablet Take 500 mg by mouth daily.   Biotin 1 MG CAPS Take 1 mg by mouth daily.   Cholecalciferol (VITAMIN D -1000 MAX ST) 25 MCG (1000 UT) tablet Take 1,000 Units by mouth daily.   cyclobenzaprine  (FLEXERIL ) 5 MG tablet Take 1 tablet (5 mg total) by mouth 3 (three) times daily as needed for muscle spasms.   estradiol  (ESTRACE ) 0.01 % CREA vaginal cream Estrogen Cream Instructions Discard  applicator Apply pea sized amount to tip of finger to urethra before bed every night for 1 week the use Monday, Wednesday and Friday. Wash hands well after application.   fentaNYL  (DURAGESIC ) 25 MCG/HR Place 1 patch onto the skin every 3 (three) days.   HYDROcodone -acetaminophen  (NORCO) 10-325 MG tablet Take 1 tablet by mouth every 4 (four) hours as needed for severe pain (pain score 7-10) (Postoperative pain).   lidocaine  (LIDODERM ) 5 % Place 2 patches onto the skin daily. Remove & Discard patch within 12 hours or as directed by MD   metoprolol  succinate (TOPROL -XL) 25 MG 24 hr tablet Take 1 tablet (25 mg total) by mouth daily. Take with or immediately following a meal.   pantoprazole  (PROTONIX ) 40 MG tablet Take 1 tablet (40 mg total) by mouth daily.   pregabalin  (LYRICA ) 150 MG capsule Take 150 mg by mouth 2 (two) times daily.   [DISCONTINUED] ezetimibe  (ZETIA ) 10 MG tablet Take one tablet Monday, Wednesday and Friday.   [DISCONTINUED] FLUoxetine  (PROZAC ) 40 MG capsule Take 1 capsule (40 mg total) by mouth daily.   [DISCONTINUED] potassium chloride  (KLOR-CON ) 10 MEQ tablet Take 1 tablet (10 mEq total) by mouth daily.   ezetimibe  (ZETIA ) 10 MG tablet Take one tablet Monday, Wednesday and Friday.   FLUoxetine  (PROZAC ) 40 MG capsule Take 1 capsule (40 mg total) by mouth daily.   magic mouthwash (nystatin , hydrocortisone , diphenhydrAMINE , lidocaine ) suspension Swish and spit 5 mLs 4 (four) times daily as needed for mouth pain. (Patient not taking: Reported on 01/10/2025)   potassium chloride  (KLOR-CON ) 10 MEQ tablet Take 1 tablet (10 mEq total) by mouth daily.   [DISCONTINUED] docusate sodium  (COLACE) 100 MG capsule Take 1 capsule (100 mg total) by mouth 2 (two) times daily.   [DISCONTINUED] pregabalin  (LYRICA ) 200 MG capsule Take 1 capsule (200 mg total) by mouth 2 (two) times daily. (Patient not taking: Reported on 12/07/2024)   No facility-administered encounter medications on file as of 01/10/2025.      Lab Results  Component Value Date   WBC 9.5 12/10/2024   HGB 13.1 12/10/2024   HCT 40.4 12/10/2024   PLT 280 12/10/2024   GLUCOSE 95 12/10/2024   CHOL 248 (H) 09/06/2024   TRIG 156 (H) 09/06/2024   HDL 52 09/06/2024   LDLDIRECT 139.0 05/04/2024   LDLCALC 168 (H) 09/06/2024   ALT 11 09/08/2024   AST 25 09/08/2024   NA 139 12/10/2024   K 4.1 12/10/2024   CL 102 12/10/2024   CREATININE 0.82 12/10/2024   BUN 12 12/10/2024   CO2 26 12/10/2024   TSH 0.72 11/15/2023   INR 1.0 09/13/2024   HGBA1C 6.1 (H) 09/06/2024    DG Lumbar Spine 2-3 Views Result Date: 12/13/2024 EXAM: FLUOROSCOPIC IMAGING TECHNIQUE: Fluoroscopy was provided by the radiology department for  procedure. Radiologist was not present during examination. Fluoroscopy time: 47.5 seconds. RADIATION DOSE INDEX: Reference Air Kerma: 22.14 mGy COMPARISON: None available. CLINICAL HISTORY: Elective surgery FINDINGS: Intraoperative fluoroscopic imaging was performed during posterior lumbar fusion. IMPRESSION: 1. Intraoperative fluoroscopic imaging as above. Please refer to the operative report for full details. Electronically signed by: Pinkie Pebbles MD MD 12/13/2024 07:20 PM EST RP Workstation: [id number]   DG C-Arm 1-60 Min-No Report Result Date: 12/13/2024 Fluoroscopy was utilized by the requesting physician.  No radiographic interpretation.   DG C-Arm 1-60 Min-No Report Result Date: 12/13/2024 Fluoroscopy was utilized by the requesting physician.  No radiographic interpretation.       Assessment & Plan:  Epigastric pain Assessment & Plan: Increased pain - epigastric region. Increased nausea, dry heaves. Has not eaten today. Weakness. Given increased pain and weakness, discussed need for further w/up and evaluation. Agreeable. ER notified.   Orders: -     EKG 12-Lead  Essential hypertension  Gastroesophageal reflux disease, unspecified whether esophagitis present Assessment & Plan: Continue protonix .     Tachycardia Assessment & Plan: Heart rate increased. Feel related to increased pain. aEKG - SR/ST. W/up planned in ER as outlined.    Other orders -     Ezetimibe ; Take one tablet Monday, Wednesday and Friday.  Dispense: 39 tablet; Refill: 1 -     FLUoxetine  HCl; Take 1 capsule (40 mg total) by mouth daily.  Dispense: 90 capsule; Refill: 1 -     Potassium Chloride  ER; Take 1 tablet (10 mEq total) by mouth daily.  Dispense: 90 tablet; Refill: 1     Allena Hamilton, MD "

## 2025-01-10 NOTE — ED Triage Notes (Signed)
 Pt comes with c/o abdominal pain. Pt states this started today. Pt states mid epigastric pain. Pt states dry heaves. Pt denies any urinary symptoms.

## 2025-01-11 ENCOUNTER — Ambulatory Visit: Payer: Self-pay | Admitting: Internal Medicine

## 2025-02-28 ENCOUNTER — Ambulatory Visit: Admitting: Urology

## 2025-03-19 ENCOUNTER — Ambulatory Visit

## 2025-04-16 ENCOUNTER — Ambulatory Visit: Admitting: Physician Assistant
